# Patient Record
Sex: Female | Born: 1945 | Race: Black or African American | Hispanic: No | Smoking: Former smoker
Health system: Southern US, Community
[De-identification: ages and names within clinical notes are randomized; demographics above are authoritative.]

## PROBLEM LIST (undated history)

## (undated) DIAGNOSIS — M1990 Unspecified osteoarthritis, unspecified site: Secondary | ICD-10-CM

## (undated) DIAGNOSIS — R350 Frequency of micturition: Secondary | ICD-10-CM

## (undated) DIAGNOSIS — L732 Hidradenitis suppurativa: Secondary | ICD-10-CM

## (undated) DIAGNOSIS — IMO0001 Reserved for inherently not codable concepts without codable children: Secondary | ICD-10-CM

## (undated) DIAGNOSIS — Z9221 Personal history of antineoplastic chemotherapy: Secondary | ICD-10-CM

## (undated) DIAGNOSIS — E119 Type 2 diabetes mellitus without complications: Secondary | ICD-10-CM

## (undated) DIAGNOSIS — D649 Anemia, unspecified: Secondary | ICD-10-CM

## (undated) DIAGNOSIS — E785 Hyperlipidemia, unspecified: Secondary | ICD-10-CM

## (undated) DIAGNOSIS — I10 Essential (primary) hypertension: Secondary | ICD-10-CM

## (undated) DIAGNOSIS — I517 Cardiomegaly: Secondary | ICD-10-CM

## (undated) DIAGNOSIS — K219 Gastro-esophageal reflux disease without esophagitis: Secondary | ICD-10-CM

## (undated) DIAGNOSIS — Z853 Personal history of malignant neoplasm of breast: Secondary | ICD-10-CM

## (undated) DIAGNOSIS — Z923 Personal history of irradiation: Secondary | ICD-10-CM

## (undated) DIAGNOSIS — J449 Chronic obstructive pulmonary disease, unspecified: Secondary | ICD-10-CM

## (undated) HISTORY — PX: BLADDER SUSPENSION: SHX72

## (undated) HISTORY — DX: Unspecified osteoarthritis, unspecified site: M19.90

## (undated) HISTORY — PX: TONSILLECTOMY: SUR1361

## (undated) HISTORY — PX: ABDOMINAL HYSTERECTOMY: SHX81

## (undated) HISTORY — PX: PILONIDAL CYST EXCISION: SHX744

## (undated) HISTORY — DX: Chronic obstructive pulmonary disease, unspecified: J44.9

## (undated) HISTORY — DX: Morbid (severe) obesity due to excess calories: E66.01

## (undated) HISTORY — DX: Cardiomegaly: I51.7

## (undated) HISTORY — DX: Anemia, unspecified: D64.9

## (undated) HISTORY — DX: Essential (primary) hypertension: I10

## (undated) HISTORY — DX: Hyperlipidemia, unspecified: E78.5

---

## 1998-08-16 ENCOUNTER — Other Ambulatory Visit: Admission: RE | Admit: 1998-08-16 | Discharge: 1998-08-16 | Payer: Self-pay

## 1998-09-20 ENCOUNTER — Ambulatory Visit (HOSPITAL_BASED_OUTPATIENT_CLINIC_OR_DEPARTMENT_OTHER): Admission: RE | Admit: 1998-09-20 | Discharge: 1998-09-20 | Payer: Self-pay | Admitting: Surgery

## 2004-06-04 ENCOUNTER — Encounter (INDEPENDENT_AMBULATORY_CARE_PROVIDER_SITE_OTHER): Payer: Self-pay | Admitting: *Deleted

## 2004-06-04 ENCOUNTER — Ambulatory Visit (HOSPITAL_COMMUNITY): Admission: RE | Admit: 2004-06-04 | Discharge: 2004-06-04 | Payer: Self-pay | Admitting: Surgery

## 2004-06-04 ENCOUNTER — Ambulatory Visit (HOSPITAL_BASED_OUTPATIENT_CLINIC_OR_DEPARTMENT_OTHER): Admission: RE | Admit: 2004-06-04 | Discharge: 2004-06-04 | Payer: Self-pay | Admitting: Surgery

## 2004-06-04 HISTORY — PX: PILONIDAL CYST / SINUS EXCISION: SUR543

## 2004-06-05 ENCOUNTER — Ambulatory Visit (HOSPITAL_COMMUNITY): Admission: RE | Admit: 2004-06-05 | Discharge: 2004-06-05 | Payer: Self-pay | Admitting: Surgery

## 2004-11-12 ENCOUNTER — Other Ambulatory Visit: Admission: RE | Admit: 2004-11-12 | Discharge: 2004-11-12 | Payer: Self-pay | Admitting: Family Medicine

## 2008-08-16 ENCOUNTER — Inpatient Hospital Stay (HOSPITAL_COMMUNITY): Admission: EM | Admit: 2008-08-16 | Discharge: 2008-08-18 | Payer: Self-pay | Admitting: Emergency Medicine

## 2008-08-17 HISTORY — PX: INCISION AND DRAINAGE ABSCESS: SHX5864

## 2010-06-10 LAB — BASIC METABOLIC PANEL
CO2: 32 mEq/L (ref 19–32)
Calcium: 8.7 mg/dL (ref 8.4–10.5)
Chloride: 106 mEq/L (ref 96–112)
GFR calc non Af Amer: >60 mL/min (ref >60)
Glucose, Bld: 100 mg/dL — ABNORMAL HIGH (ref 70–99)
Glucose, Bld: 162 mg/dL — ABNORMAL HIGH (ref 70–99)
Potassium: 4.4 mEq/L (ref 3.5–5.1)
Sodium: 137 mEq/L (ref 135–145)
Sodium: 140 mEq/L (ref 135–145)

## 2010-06-10 LAB — GLUCOSE, CAPILLARY
Glucose-Capillary: 100 mg/dL — ABNORMAL HIGH (ref 70–99)
Glucose-Capillary: 107 mg/dL — ABNORMAL HIGH (ref 70–99)
Glucose-Capillary: 109 mg/dL — ABNORMAL HIGH (ref 70–99)
Glucose-Capillary: 118 mg/dL — ABNORMAL HIGH (ref 70–99)
Glucose-Capillary: 173 mg/dL — ABNORMAL HIGH (ref 70–99)
Glucose-Capillary: 253 mg/dL — ABNORMAL HIGH (ref 70–99)
Glucose-Capillary: 79 mg/dL (ref 70–99)
Glucose-Capillary: 79 mg/dL (ref 70–99)
Glucose-Capillary: 90 mg/dL (ref 70–99)
Glucose-Capillary: 96 mg/dL (ref 70–99)

## 2010-06-10 LAB — DIFFERENTIAL
Basophils Relative: 1 % (ref 0–1)
Eosinophils Absolute: 0.2 10*3/uL (ref 0.0–0.7)
Neutrophils Relative %: 62 % (ref 43–77)

## 2010-06-10 LAB — CBC
MCHC: 32.7 g/dL (ref 30.0–36.0)
MCV: 86.3 fL (ref 78.0–100.0)
Platelets: 226 10*3/uL (ref 150–400)

## 2010-07-16 NOTE — Op Note (Signed)
Kristy Harris, Kristy Harris               ACCOUNT NO.:  [id number]   MEDICAL RECORD NO.:  [id number]          PATIENT TYPE:  INP   LOCATION:  5149                         FACILITY:  [facility]   PHYSICIAN:  Sandria Bales. Ezzard Standing, M.D.  DATE OF BIRTH:  07-May-1945   DATE OF PROCEDURE:  08/17/2008  DATE OF DISCHARGE:                               OPERATIVE REPORT   Date of Surgery - 17 August 2008   PREOPERATIVE DIAGNOSIS:  Multiple perineal and buttock abscesses.   POSTOPERATIVE DIAGNOSIS:  Multiple perineal and buttock abscesses (one  anterior right groin, one posterior right groin, one posterior left  groin and then left buttock/perianal area).   PROCEDURE:  Incision and drainage of the abscesses.   SURGEON:  Sandria Bales. Ezzard Standing, M.D.   ANESTHESIA:  General endotracheal.   ESTIMATED BLOOD LOSS:  100 mL.   DRAINS LEFT:  None.   INDICATIONS FOR PROCEDURE:  [name] is a 65 year old black female  who is diabetic and morbidly obese with a BMI approximately 54 who has  had recurrent multiple perineal and buttocks abscesses.   She has come again with these abscesses, which are sore, tender.  She  was seen by Dr. Manus Rudd in our urgent office, who then sent her to  [facility], where I am the doctor of the week.   I discussed with her about these abscesses.  The critical thing to  control these recurrent infections are for her to loose weight.  I  cannot stress this enough to her that without weight loss, she will  continue to have recurrent abscesses.  I think the weight loss will help  control her diabetes, which will also help these abscesses.   Potential complications of the surgery include, but are not limited to,  bleeding, infection, recurrence of the abscess, which again is almost  certain if she cannot control her weight.   OPERATIVE NOTE:  The patient was placed in the lithotomy position after  general endotracheal anesthetic.  Her perineum was prepped with Betadine  solution.  I  then incised first a right posterior groin abscess, lateral  to the posterior wall of the vagina; left posterior groin abscess at the  level of the posterior wall of the vagina; and then a left kind of  buttocks perianal abscess, which is kind of complex, watering pot  appearance to it.   The abscesses were then packed with the Betadine gauze and dressed.   The patient tolerated the procedure well was transported to recovery  room in good condition.  She will be kept overnight because of her  history of sleep apneas and these wounds and then tomorrow we will  arrange for home health care to see her after she has gone home.      Sandria Bales. Ezzard Standing, M.D.  Electronically Signed     DHN/MEDQ  D:  08/17/2008  T:  08/18/2008  Job:  [id number]

## 2010-07-19 NOTE — Op Note (Signed)
Kristy Harris, Kristy Harris               ACCOUNT NO.:  [id number]   MEDICAL RECORD NO.:  [id number]          PATIENT TYPE:  AMB   LOCATION:  DSC                          FACILITY:  [facility]   PHYSICIAN:  Sandria Bales. Ezzard Standing, M.D.  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   POSTOPERATIVE DIAGNOSES:  Recurrent draining sinus in old pilonidal  incision.  Approximately 2 cm in size.   PROCEDURE:  Excision of draining sinus.   SURGEON:  Sandria Bales. Ezzard Standing, M.D.   ANESTHESIA:  Approximately 15 mL of 1% Xylocaine.   COMPLICATIONS:  None.   INDICATIONS FOR PROCEDURE:  Ms. Polimeni had a pilonidal cyst excised in July  2000.  Approximately three monthsago,  in January 2006, she started to  develop some drainage from the upper end of this pilonidal cyst excision  track.  The patient has approximately a 2.0 cm draining wound.  It is  unclear whether this is a recurrence of the pilonidal or just some kind of a  cyst drainage within the scar itself.  Ms. Harding continues to be morbidly  obese and she knows this increases the risk of recurrent wound problems.  She is not doing anything to control her weight.   I discussed with her the indications of the procedure, the potential risks  to include bleeding, infection and that the sinus track could go much deeper  than what could be viewed behind and under local anesthesia.   DESCRIPTION OF PROCEDURE:  The patient is taken to [facility] in the minor surgery room.  She is placed in a prone  position.  Her buttocks was prepped with Betadine solution and sterilely  draped.  I infiltrated the skin with about 15 mL of 1% Xylocaine.  I then  made an elliptical incision around this 2.0 cm chronic draining sinus.  The  sinus/cyst did appear to be superficial and did not  go deep.  I closed with  interrupted #3-0 nylon sutures.   I will leave the #3-0 nylon sutures in for two weeks.  I have given her some  Vicodin for pain.  She is to call for any other problems, otherwise I will  see her in two weeks for suture removal and a review of the pathology.      DHN/MEDQ  D:  06/04/2004  T:  06/04/2004  Job:  [id number]   cc:   Ernestina Penna, M.D.  [address] [place]  [place] [address]  Fax: [phone]

## 2011-01-17 ENCOUNTER — Encounter: Payer: Self-pay | Admitting: *Deleted

## 2011-01-24 ENCOUNTER — Encounter: Payer: Self-pay | Admitting: Cardiology

## 2011-01-24 ENCOUNTER — Encounter: Payer: Self-pay | Admitting: *Deleted

## 2011-01-24 ENCOUNTER — Ambulatory Visit (INDEPENDENT_AMBULATORY_CARE_PROVIDER_SITE_OTHER): Payer: Medicare Other | Admitting: Cardiology

## 2011-01-24 VITALS — BP 94/52 | HR 85 | Ht 64.0 in | Wt 311.4 lb

## 2011-01-24 DIAGNOSIS — R0600 Dyspnea, unspecified: Secondary | ICD-10-CM | POA: Insufficient documentation

## 2011-01-24 DIAGNOSIS — E669 Obesity, unspecified: Secondary | ICD-10-CM | POA: Insufficient documentation

## 2011-01-24 DIAGNOSIS — I10 Essential (primary) hypertension: Secondary | ICD-10-CM | POA: Insufficient documentation

## 2011-01-24 DIAGNOSIS — Z01810 Encounter for preprocedural cardiovascular examination: Secondary | ICD-10-CM | POA: Insufficient documentation

## 2011-01-24 DIAGNOSIS — R0609 Other forms of dyspnea: Secondary | ICD-10-CM

## 2011-01-24 DIAGNOSIS — E785 Hyperlipidemia, unspecified: Secondary | ICD-10-CM | POA: Insufficient documentation

## 2011-01-24 DIAGNOSIS — R011 Cardiac murmur, unspecified: Secondary | ICD-10-CM

## 2011-01-24 DIAGNOSIS — R0989 Other specified symptoms and signs involving the circulatory and respiratory systems: Secondary | ICD-10-CM

## 2011-01-24 NOTE — Assessment & Plan Note (Signed)
Scheduled echocardiogram. probable flow murmur.

## 2011-01-24 NOTE — Assessment & Plan Note (Signed)
Left carotid bruit noted. Schedule carotid dopplers.

## 2011-01-24 NOTE — Assessment & Plan Note (Signed)
I discussed the importance of weight loss. 

## 2011-01-24 NOTE — Assessment & Plan Note (Signed)
Continue statin. Lipids and liver monitored by primary care. 

## 2011-01-24 NOTE — Assessment & Plan Note (Signed)
There is most likely a component of obesity hypoventilation syndrome, obstructive sleep apnea and deconditioning. However given diabetes mellitus will arrange Myoview to exclude ischemic contribution.

## 2011-01-24 NOTE — Progress Notes (Signed)
HPI: a 65 year old female with past medical history of diabetes mellitus for preoperative evaluation prior to drainage and repair of abscesses of the perineum, groin and vulva area. Patient has limited mobility because of obesity and arthritis. She has significant dyspnea on exertion. She also describes orthopnea but this improves to CPAP. She has chronic pedal edema. She denies exertional chest pain.  Current Outpatient Prescriptions  Medication Sig Dispense Refill  . aspirin 81 MG tablet Take 81 mg by mouth daily.        Marland Kitchen atorvastatin (LIPITOR) 20 MG tablet Take 20 mg by mouth daily.       . cilostazol (PLETAL) 100 MG tablet Take 100 mg by mouth 2 (two) times daily.        . ergocalciferol (VITAMIN D2) 50000 UNITS capsule Take 50,000 Units by mouth once a week.       . furosemide (LASIX) 20 MG tablet Take 20 mg by mouth daily.        Marland Kitchen glipiZIDE (GLUCOTROL XL) 10 MG 24 hr tablet Take 10 mg by mouth daily.        Marland Kitchen lisinopril (PRINIVIL,ZESTRIL) 20 MG tablet Take 20 mg by mouth daily.        . metFORMIN (GLUCOPHAGE) 500 MG tablet Take 500 mg by mouth 2 (two) times daily with a meal.        . metoprolol (TOPROL-XL) 100 MG 24 hr tablet Take 100 mg by mouth daily.        Marland Kitchen sulfamethoxazole-trimethoprim (BACTRIM DS) 800-160 MG per tablet Take 1 tablet by mouth daily.       . valsartan (DIOVAN) 160 MG tablet Take 160 mg by mouth daily.        . verapamil (CALAN) 120 MG tablet Take 120 mg by mouth 3 (three) times daily.          Allergies  Allergen Reactions  . Penicillins Rash    Past Medical History  Diagnosis Date  . Diabetes mellitus   . Morbid obesity   . Recurrent boils     of perineal and buttocks  . Sleep apnea   . Hypertension   . Hyperlipidemia   . Hidradenitis   . COPD (chronic obstructive pulmonary disease)   . Enlarged heart     Past Surgical History  Procedure Date  . Pilonidal cyst excision   . Bladder suspension   . Abdominal hysterectomy   . Abcess drainage   .  Tonsillectomy     History   Social History  . Marital Status: Widowed    Spouse Name: N/A    Number of Children: 5  . Years of Education: N/A   Occupational History  .      Disabled   Social History Main Topics  . Smoking status: Former Smoker    Quit date: 03/03/1993  . Smokeless tobacco: Not on file  . Alcohol Use: No  . Drug Use: Not on file  . Sexually Active: Not on file   Other Topics Concern  . Not on file   Social History Narrative   Has 2 sons and 3 daughters    Family History  Problem Relation Age of Onset  . Heart attack Mother     had multiple health problems  . Lung cancer Father   . Lung cancer Brother     had multiple health problems  . Heart attack Brother     had multiple health problems  . Diabetes Brother     has  1 living brother with multiple health problems  . Diabetes Sister     has 4 living sisters with multiple health problems  . Other Sister     died from gun shot wound to the head    ROS: significant back pain, arthralgias and pain from chronic abscess but no fevers or chills, productive cough, hemoptysis, dysphasia, odynophagia, melena, hematochezia, dysuria, hematuria, rash, seizure activity,  claudication. Remaining systems are negative.  Physical Exam:  Blood pressure 94/52, pulse 85, height 5\' 4"  (1.626 m), weight 311 lb 6.4 oz (141.25 kg).  General:  Well developed/morbidly obese in NAD Skin warm/dry Patient not depressed No peripheral clubbing Back-normal HEENT-normal/normal eyelids Neck supple/normal carotid upstroke bilaterally; left carotid bruit; no JVD; no thyromegaly chest - CTA/ normal expansion CV - RRR/normal S1 and S2; no  rubs or gallops;  PMI nondisplaced; 2/6 systolic murmur left sternal border. S2 is not diminished. Abdomen -difficult due to obesity, NT/ND, no HSM, no mass, + bowel sounds, no bruit femoral pulses not palpated Ext-trace edema, no chords, 2+ DP Neuro-grossly nonfocal  ECG NSR with  nonspecific ST changes.

## 2011-01-24 NOTE — Patient Instructions (Signed)
   Lexiscan cardiolite stress test  Echo   Carotid Dopplers If the results of your test are normal or stable, you will receive a letter.  If they are abnormal, the nurse will contact you by phone. Follow up in  1 month

## 2011-01-24 NOTE — Assessment & Plan Note (Signed)
Blood pressure controlled. Continue present medications. 

## 2011-01-24 NOTE — Assessment & Plan Note (Signed)
Patient has multiple cardiac risk factors including diabetes, hypertension, hyperlipidemia, family history and remote tobacco abuse. She has limited mobility and has significant dyspnea on exertion. Will arrange Myoview for risk stratification. If negative or low risk then she may proceed with surgery.

## 2011-01-28 ENCOUNTER — Other Ambulatory Visit: Payer: Self-pay | Admitting: Cardiology

## 2011-01-28 DIAGNOSIS — Z01810 Encounter for preprocedural cardiovascular examination: Secondary | ICD-10-CM

## 2011-01-28 DIAGNOSIS — R0600 Dyspnea, unspecified: Secondary | ICD-10-CM

## 2011-01-28 DIAGNOSIS — R011 Cardiac murmur, unspecified: Secondary | ICD-10-CM

## 2011-01-28 DIAGNOSIS — R0989 Other specified symptoms and signs involving the circulatory and respiratory systems: Secondary | ICD-10-CM

## 2011-01-29 ENCOUNTER — Telehealth: Payer: Self-pay | Admitting: *Deleted

## 2011-01-29 NOTE — Telephone Encounter (Signed)
Lexiscan Cardiolite, Cartoid Dopplers, 2 D ECHO scheduled for 02-03-2011 @ [facility] Checking percert

## 2011-01-29 NOTE — Telephone Encounter (Signed)
No precert required 

## 2011-02-03 DIAGNOSIS — R079 Chest pain, unspecified: Secondary | ICD-10-CM

## 2011-02-04 DIAGNOSIS — R0602 Shortness of breath: Secondary | ICD-10-CM

## 2011-02-14 ENCOUNTER — Ambulatory Visit (INDEPENDENT_AMBULATORY_CARE_PROVIDER_SITE_OTHER): Payer: Medicare Other | Admitting: Cardiology

## 2011-02-14 ENCOUNTER — Encounter: Payer: Self-pay | Admitting: *Deleted

## 2011-02-14 ENCOUNTER — Encounter: Payer: Self-pay | Admitting: Physician Assistant

## 2011-02-14 VITALS — BP 142/78 | HR 93 | Ht 64.0 in | Wt 308.0 lb

## 2011-02-14 DIAGNOSIS — I739 Peripheral vascular disease, unspecified: Secondary | ICD-10-CM

## 2011-02-14 DIAGNOSIS — R9430 Abnormal result of cardiovascular function study, unspecified: Secondary | ICD-10-CM

## 2011-02-14 DIAGNOSIS — Z01810 Encounter for preprocedural cardiovascular examination: Secondary | ICD-10-CM

## 2011-02-14 DIAGNOSIS — R0989 Other specified symptoms and signs involving the circulatory and respiratory systems: Secondary | ICD-10-CM

## 2011-02-14 DIAGNOSIS — R0602 Shortness of breath: Secondary | ICD-10-CM

## 2011-02-14 DIAGNOSIS — E785 Hyperlipidemia, unspecified: Secondary | ICD-10-CM

## 2011-02-14 NOTE — Patient Instructions (Signed)
   JV Cath - arm case - next Friday  Follow up will be given at time of discharge from above

## 2011-02-15 DIAGNOSIS — R9430 Abnormal result of cardiovascular function study, unspecified: Secondary | ICD-10-CM | POA: Insufficient documentation

## 2011-02-15 DIAGNOSIS — E119 Type 2 diabetes mellitus without complications: Secondary | ICD-10-CM | POA: Insufficient documentation

## 2011-02-15 DIAGNOSIS — I739 Peripheral vascular disease, unspecified: Secondary | ICD-10-CM | POA: Insufficient documentation

## 2011-02-15 NOTE — Assessment & Plan Note (Signed)
The patient was instructed to hold her metformin the day of her procedure as well as 48 hours thereafter.

## 2011-02-15 NOTE — Assessment & Plan Note (Signed)
I had a long discussion with the patient on how to proceed with the information obtained from a cardiovascular function study. She has no unstable symptoms although clinical evaluation is extremely limited due to her poor functional status related to her severe arthritis and groin abscess which cause significant distress and pain. The patient does have multiple cardiovascular risk factors and now an abnormal cardiovascular function study. Although she potentially could be cleared for surgery without proceeding first with a cardiac catheterization, given the fact that her surgery is not scheduled until later in January 2013, I told the patient that regardless of the surgery she is a high risk patient and we should proceed with cardiac catheterization to define her coronary anatomy. However, I also told her that hopefully we can avoid stent placement or further revascularization prior to her surgery. The patient is at high infectious risk with an active abscess in her right groin and also placing her on dual antiplatelet therapy would prohibit her from proceeding with surgery. However if she has critical disease that is amenable to percutaneous intervention, consideration may be given to a bare-metal stent with antiplatelet therapy for 30 days, minimally delaying her surgery. However this is still not an ideal scenario given her active abscess in the groin, perineum and vulva area. I told the patient that I am actually recommending this more from risk stratification perspective. Potential intervention and revascularization is needed may still need to wait until after surgery. Depending on the findings of her cardiac catheterization will define her further immediate risk for surgery. I discussed at great length with the patient's the risks and benefits of cardiac catheterization. She understands these and is willing to proceed. Obviously, we will need to proceed from an arm approach given her active infection in the  groin area. The patient will be scheduled for next week.

## 2011-02-15 NOTE — Progress Notes (Signed)
 Guy De Gent, MD, FACC ABIM Board Certified in Adult Cardiovascular Medicine,Internal Medicine and Critical Care Medicine    CC: Preoperative evaluation after recent positive nuclear perfusion study.  HPI:  The patient is a 65-year-old female with multiple cardiovascular risk factors including diabetes mellitus and a very strong family history of coronary artery disease with her brother with an MI in his 40s and mother had heart disease at age 67. The patient is scheduled in 2013 to undergo extensive surgery of the groin area because of an abscess involving the perineum and groin and vulva area. The patient was recently seen for preoperative evaluation by Dr. Brian Crenshaw. Several studies were ordered including carotid Dopplers, echocardiogram and nuclear perfusion study. Dopplers were essentially negative the echocardiogram showed normal LV function, however the nuclear perfusion study was abnormal with 2 defects one small apical defect that was reversible as well as a medium-sized lateral defect that was reversible. 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 She does become short of breath on minimal exertion although she denies any chest pain. She does report orthopnea PND. She has sleep apnea and is on CPAP. She's here today to discuss the abnormal Cardiolite stress study and whether we should proceed with cardiac catheterization prior to her surgical procedure.     PMH: reviewed and listed in Problem List in Electronic Records (and see below) Past Medical History  Diagnosis Date  . Diabetes mellitus   . Morbid obesity   . Recurrent boils     of perineal and buttocks  . Sleep apnea   . Hypertension   . Hyperlipidemia   . Hidradenitis   . COPD (chronic obstructive pulmonary disease)   . Enlarged heart    Past Surgical History  Procedure Date  .  Pilonidal cyst excision   . Bladder suspension   . Abdominal hysterectomy   . Abcess drainage   . Tonsillectomy       Allergies/SH/FHX : available in Electronic Records for review Allergies  Allergen Reactions  . Penicillins Rash   History   Social History  . Marital Status: Widowed    Spouse Name: N/A    Number of Children: 5  . Years of Education: N/A   Occupational History  .      Disabled   Social History Main Topics  . Smoking status: Former Smoker    Quit date: 03/03/1993  . Smokeless tobacco: Never Used  . Alcohol Use: No  . Drug Use: Not on file  . Sexually Active: Not on file   Other Topics Concern  . Not on file   Social History Narrative   Has 2 sons and 3 daughters   Family History  Problem Relation Age of Onset  . Heart attack Mother     had multiple health problems  . Lung cancer Father   . Lung cancer Brother     had multiple health problems  . Heart attack Brother     had multiple health problems  . Diabetes Brother     has 1 living brother with multiple health problems  . Diabetes Sister     has 4 living sisters with multiple health problems  . Other Sister     died from gun shot wound to the head    Medications: Current Outpatient Prescriptions  Medication Sig Dispense Refill  . aspirin 81   MG tablet Take 81 mg by mouth daily.        . atorvastatin (LIPITOR) 20 MG tablet Take 20 mg by mouth daily.       . cilostazol (PLETAL) 100 MG tablet Take 100 mg by mouth 2 (two) times daily.        . ergocalciferol (VITAMIN D2) 50000 UNITS capsule Take 50,000 Units by mouth once a week.       . furosemide (LASIX) 20 MG tablet Take 20 mg by mouth daily.        . glipiZIDE (GLUCOTROL XL) 10 MG 24 hr tablet Take 10 mg by mouth daily.        . lisinopril (PRINIVIL,ZESTRIL) 20 MG tablet Take 20 mg by mouth daily.        . metFORMIN (GLUCOPHAGE) 500 MG tablet Take 500 mg by mouth 2 (two) times daily with a meal.        . metoprolol (TOPROL-XL) 100 MG  24 hr tablet Take 100 mg by mouth daily.        . sulfamethoxazole-trimethoprim (BACTRIM DS) 800-160 MG per tablet Take 1 tablet by mouth daily.       . valsartan (DIOVAN) 160 MG tablet Take 160 mg by mouth daily.        . verapamil (CALAN) 120 MG tablet Take 120 mg by mouth 3 (three) times daily.          ROS: No nausea or vomiting. No fever or chills.No melena or hematochezia.No bleeding.No claudication. Limited mobility. Chronic knee pain bilaterally   Physical Exam: BP 142/78  Pulse 93  Ht 5' 4" (1.626 m)  Wt 308 lb (139.708 kg)  BMI 52.87 kg/m2  SpO2 93% General:of obese African American female in no distress  Neck: normal carotid upstroke without carotid bruits. No thyromegaly nonnodular thyroid. JVD is approximately 7 cm sitting upright  Lungs: clear breath sounds bilaterally without wheezing.  Cardiac: regular rate and rhythm with normal S1-S2 and no murmur rubs or gallops  Vascular: 1+ peripheral pitting edema. Normal dorsalis pedis and posterior tibial pulses bilaterally. Not performed  Skin:  12lead ECG: Limited bedside ECHO:N/A   Assessment and Plan  Counseling was provided regarding the current medical condition and included: . Diagnosis, impressions, prognosis, recommended diagnostic studies  . Risks and benefits of treatment options  . Instructions for management, treatment and/or follow-up care  . Importance of compliance with treatment, risk factor reduction  . Patient and/or family education    Time spent counseling was 60 minutes and recorded in the Problem List.     

## 2011-02-15 NOTE — Assessment & Plan Note (Signed)
I could not hear a carotid bruit on exam. Carotid Dopplers were also within normal limits.

## 2011-02-15 NOTE — Assessment & Plan Note (Signed)
I do not have any further details on this although I noticed that the patient is on Pletal reviewing her medication list. She does not report any active claudication but again she is very limited in her activity. The patient could potentially undergo at the time of her catheterization and distal angiogram, although we will wait for that decision after review of her renal function. Certainly if further noninvasive evaluation can be done at a later time. The patient currently has no poor healing wounds in the extremities.

## 2011-02-15 NOTE — Assessment & Plan Note (Signed)
Followed by the patient's primary care physician and she has taken Lipitor.

## 2011-02-17 ENCOUNTER — Telehealth: Payer: Self-pay | Admitting: *Deleted

## 2011-02-17 NOTE — Telephone Encounter (Signed)
°  Left JV cath scheduled for Friday, 12/21 at 10:30 with [facility].

## 2011-02-18 NOTE — Telephone Encounter (Signed)
No precert required 

## 2011-02-20 ENCOUNTER — Other Ambulatory Visit: Payer: Self-pay | Admitting: Cardiology

## 2011-02-20 DIAGNOSIS — I2510 Atherosclerotic heart disease of native coronary artery without angina pectoris: Secondary | ICD-10-CM

## 2011-02-21 ENCOUNTER — Encounter (HOSPITAL_BASED_OUTPATIENT_CLINIC_OR_DEPARTMENT_OTHER)
Admission: RE | Disposition: A | Discharge status: Home / Self Care | Payer: Self-pay | Source: Ambulatory Visit | Attending: Cardiovascular Disease

## 2011-02-21 ENCOUNTER — Inpatient Hospital Stay (HOSPITAL_BASED_OUTPATIENT_CLINIC_OR_DEPARTMENT_OTHER)
Admission: RE | Admit: 2011-02-21 | Discharge: 2011-02-21 | Disposition: A | Discharge status: Home / Self Care | Payer: Medicare Other | Source: Ambulatory Visit | Attending: Cardiovascular Disease | Admitting: Cardiovascular Disease

## 2011-02-21 ENCOUNTER — Encounter (HOSPITAL_BASED_OUTPATIENT_CLINIC_OR_DEPARTMENT_OTHER): Payer: Self-pay | Admitting: Cardiovascular Disease

## 2011-02-21 DIAGNOSIS — E119 Type 2 diabetes mellitus without complications: Secondary | ICD-10-CM | POA: Insufficient documentation

## 2011-02-21 DIAGNOSIS — R0609 Other forms of dyspnea: Secondary | ICD-10-CM

## 2011-02-21 DIAGNOSIS — I2510 Atherosclerotic heart disease of native coronary artery without angina pectoris: Secondary | ICD-10-CM

## 2011-02-21 DIAGNOSIS — R0989 Other specified symptoms and signs involving the circulatory and respiratory systems: Secondary | ICD-10-CM

## 2011-02-21 DIAGNOSIS — G4730 Sleep apnea, unspecified: Secondary | ICD-10-CM | POA: Insufficient documentation

## 2011-02-21 DIAGNOSIS — R9439 Abnormal result of other cardiovascular function study: Secondary | ICD-10-CM | POA: Insufficient documentation

## 2011-02-21 DIAGNOSIS — J449 Chronic obstructive pulmonary disease, unspecified: Secondary | ICD-10-CM | POA: Insufficient documentation

## 2011-02-21 DIAGNOSIS — J4489 Other specified chronic obstructive pulmonary disease: Secondary | ICD-10-CM | POA: Insufficient documentation

## 2011-02-21 DIAGNOSIS — I517 Cardiomegaly: Secondary | ICD-10-CM | POA: Insufficient documentation

## 2011-02-21 HISTORY — PX: CARDIAC CATHETERIZATION: SHX172

## 2011-02-21 LAB — POCT I-STAT GLUCOSE
Glucose, Bld: 76 mg/dL (ref 70–99)
Operator id: 221371

## 2011-02-21 SURGERY — JV LEFT HEART CATHETERIZATION WITH CORONARY ANGIOGRAM
Anesthesia: Moderate Sedation

## 2011-02-21 MED ORDER — ASPIRIN 81 MG PO CHEW
324.0000 mg | CHEWABLE_TABLET | ORAL | Status: AC
  Administered 2011-02-21: 324 mg via ORAL

## 2011-02-21 MED ORDER — DIAZEPAM 5 MG PO TABS
5.0000 mg | ORAL_TABLET | ORAL | Status: AC
  Administered 2011-02-21: 5 mg via ORAL

## 2011-02-21 MED ORDER — ONDANSETRON HCL 4 MG/2ML IJ SOLN: 4.0000 mg | Freq: Four times a day (QID) | INTRAMUSCULAR | Status: DC | PRN

## 2011-02-21 MED ORDER — ACETAMINOPHEN 325 MG PO TABS: 650.0000 mg | ORAL_TABLET | ORAL | Status: DC | PRN

## 2011-02-21 MED ORDER — SODIUM CHLORIDE 0.9 % IV SOLN
INTRAVENOUS | Status: DC
  Administered 2011-02-21: 11:00:00 via INTRAVENOUS

## 2011-02-21 NOTE — Progress Notes (Signed)
Allen's test  Performed  On right wrist with normal results.

## 2011-02-21 NOTE — Brief Op Note (Signed)
    Cardiac Cath Note  Kristy Harris [id number] Dec 24, 1945  Procedure: Left Heart Cardiac Catheterization Note Indications: Dyspnea, Abn. Myoview  Procedure Details Consent: Obtained Time Out: Verified patient identification, verified procedure, site/side was marked, verified correct patient position, special equipment/implants available, Radiology Safety Procedures followed,  medications/allergies/relevent history reviewed, required imaging and test results available.  Performed  The right radial artery was cannulated using the standard technique.  Verapamil 3 mg intraarterial was given.  Heparin 5000 units IV was given.  The catheters were all exchanged over a Rosen guidewire.  Hemodynamics:   LV pressure: 134/12 Aortic pressure: 135/48  Angiography   Left Main: Smooth and normal.  Left anterior Descending: The left anterior descending artery is a large vessel. It is smooth and normal throughout course. It is fairly tortuous in the mid and distal segments. It gives off a large first diagonal branch which is normal.  Left Circumflex: The left circumflex artery is a moderate-sized branch. It is smooth and normal throughout its course.  Ramus intermediate artery: The ramus intermediate artery is moderate to large in size and is completely normal.  Right Coronary Artery: The right coronary artery is large and dominant. The posterior descending artery is normal. The posterior lateral segment artery is normal.  LV Gram: The left ventriculogram was performed in the 30 RAO position. It reveals vigorous left ventricular systolic function.  Ejection fraction is about a 75%.  Complications: No apparent complications Patient did tolerate procedure well.  Conclusions:   1. Smooth and normal coronary arteries  2. Normal left ventricular systolic function.   [name], Montez Hageman., MD, [facility] [place] 02/21/2011, 12:27 PM

## 2011-02-21 NOTE — H&P (View-Only) (Signed)
Kristy Bottoms, MD, [facility] Board Certified in Adult Cardiovascular Medicine,Internal Medicine and Critical Care Medicine    CC: Preoperative evaluation after recent positive nuclear perfusion study.  HPI:  The patient is a 65 year old female with multiple cardiovascular risk factors including diabetes mellitus and a very strong family history of coronary artery disease with her brother with an MI in his 41s and mother had heart disease at age 33. The patient is scheduled in 2013 to undergo extensive surgery of the groin area because of an abscess involving the perineum and groin and vulva area. The patient was recently seen for preoperative evaluation by Dr. Olga Millers. Several studies were ordered including carotid Dopplers, echocardiogram and nuclear perfusion study. Dopplers were essentially negative the echocardiogram showed normal LV function, however the nuclear perfusion study was abnormal with 2 defects one small apical defect that was reversible as well as a medium-sized lateral defect that was reversible. 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 She does become short of breath on minimal exertion although she denies any chest pain. She does report orthopnea PND. She has sleep apnea and is on CPAP. She's here today to discuss the abnormal Cardiolite stress study and whether we should proceed with cardiac catheterization prior to her surgical procedure.     PMH: reviewed and listed in Problem List in Electronic Records (and see below) Past Medical History  Diagnosis Date  . Diabetes mellitus   . Morbid obesity   . Recurrent boils     of perineal and buttocks  . Sleep apnea   . Hypertension   . Hyperlipidemia   . Hidradenitis   . COPD (chronic obstructive pulmonary disease)   . Enlarged heart    Past Surgical History  Procedure Date  .  Pilonidal cyst excision   . Bladder suspension   . Abdominal hysterectomy   . Abcess drainage   . Tonsillectomy       Allergies/SH/FHX : available in Electronic Records for review Allergies  Allergen Reactions  . Penicillins Rash   History   Social History  . Marital Status: Widowed    Spouse Name: N/A    Number of Children: 5  . Years of Education: N/A   Occupational History  .      Disabled   Social History Main Topics  . Smoking status: Former Smoker    Quit date: 03/03/1993  . Smokeless tobacco: Never Used  . Alcohol Use: No  . Drug Use: Not on file  . Sexually Active: Not on file   Other Topics Concern  . Not on file   Social History Narrative   Has 2 sons and 3 daughters   Family History  Problem Relation Age of Onset  . Heart attack Mother     had multiple health problems  . Lung cancer Father   . Lung cancer Brother     had multiple health problems  . Heart attack Brother     had multiple health problems  . Diabetes Brother     has 1 living brother with multiple health problems  . Diabetes Sister     has 4 living sisters with multiple health problems  . Other Sister     died from gun shot wound to the head    Medications: Current Outpatient Prescriptions  Medication Sig Dispense Refill  . aspirin 81  MG tablet Take 81 mg by mouth daily.        Marland Kitchen atorvastatin (LIPITOR) 20 MG tablet Take 20 mg by mouth daily.       . cilostazol (PLETAL) 100 MG tablet Take 100 mg by mouth 2 (two) times daily.        . ergocalciferol (VITAMIN D2) 50000 UNITS capsule Take 50,000 Units by mouth once a week.       . furosemide (LASIX) 20 MG tablet Take 20 mg by mouth daily.        Marland Kitchen glipiZIDE (GLUCOTROL XL) 10 MG 24 hr tablet Take 10 mg by mouth daily.        Marland Kitchen lisinopril (PRINIVIL,ZESTRIL) 20 MG tablet Take 20 mg by mouth daily.        . metFORMIN (GLUCOPHAGE) 500 MG tablet Take 500 mg by mouth 2 (two) times daily with a meal.        . metoprolol (TOPROL-XL) 100 MG  24 hr tablet Take 100 mg by mouth daily.        Marland Kitchen sulfamethoxazole-trimethoprim (BACTRIM DS) 800-160 MG per tablet Take 1 tablet by mouth daily.       . valsartan (DIOVAN) 160 MG tablet Take 160 mg by mouth daily.        . verapamil (CALAN) 120 MG tablet Take 120 mg by mouth 3 (three) times daily.          ROS: No nausea or vomiting. No fever or chills.No melena or hematochezia.No bleeding.No claudication. Limited mobility. Chronic knee pain bilaterally   Physical Exam: BP 142/78  Pulse 93  Ht 5\' 4"  (1.626 m)  Wt 308 lb (139.708 kg)  BMI 52.87 kg/m2  SpO2 93% General:of obese African American female in no distress  Neck: normal carotid upstroke without carotid bruits. No thyromegaly nonnodular thyroid. JVD is approximately 7 cm sitting upright  Lungs: clear breath sounds bilaterally without wheezing.  Cardiac: regular rate and rhythm with normal S1-S2 and no murmur rubs or gallops  Vascular: 1+ peripheral pitting edema. Normal dorsalis pedis and posterior tibial pulses bilaterally. Not performed  Skin:  12lead ECG: Limited bedside ECHO:N/A   Assessment and Plan  Counseling was provided regarding the current medical condition and included: . Diagnosis, impressions, prognosis, recommended diagnostic studies  . Risks and benefits of treatment options  . Instructions for management, treatment and/or follow-up care  . Importance of compliance with treatment, risk factor reduction  . Patient and/or family education    Time spent counseling was 60 minutes and recorded in the Problem List.

## 2011-02-21 NOTE — Interval H&P Note (Signed)
History and Physical Interval Note:  02/21/2011 11:18 AM  Kristy Harris  has presented today for surgery, with the diagnosis of chest pain  The various methods of treatment have been discussed with the patient and family. After consideration of risks, benefits and other options for treatment, the patient has consented to  Procedure(s): JV LEFT HEART CATHETERIZATION WITH CORONARY ANGIOGRAM as a surgical intervention .  The patients' history has been reviewed, patient examined, no change in status, stable for surgery.  I have reviewed the patients' chart and labs.  Questions were answered to the patient's satisfaction.   I have talked to patient and family. Chart , results of tests, meds reviewed.  Plan is to do a radial diagnostic cath.  Dr. Sanjuana Kava will assist.    Elyn Aquas.

## 2011-02-21 NOTE — Progress Notes (Signed)
TR band removed and tegaderm dressing applied.  Ambulated to bathroom in a wheelchair.  R radial site level 0.

## 2011-02-21 NOTE — Op Note (Signed)
    Cardiac Cath Note  Kristy Harris [id number] 04/16/1945  Procedure: Left Heart Cardiac Catheterization Note Indications: Dyspnea, Abn. Myoview  Procedure Details Consent: Obtained Time Out: Verified patient identification, verified procedure, site/side was marked, verified correct patient position, special equipment/implants available, Radiology Safety Procedures followed,  medications/allergies/relevent history reviewed, required imaging and test results available.  Performed  The right radial artery was cannulated using the standard technique.  Verapamil 3 mg intraarterial was given.  Heparin 5000 units IV was given.  The catheters were all exchanged over a Rosen guidewire.  Hemodynamics:   LV pressure: 134/12 Aortic pressure: 135/48  Angiography   Left Main: Smooth and normal.  Left anterior Descending: The left anterior descending artery is a large vessel. It is smooth and normal throughout course. It is fairly tortuous in the mid and distal segments. It gives off a large first diagonal branch which is normal.  Left Circumflex: The left circumflex artery is a moderate-sized branch. It is smooth and normal throughout its course.  Ramus intermediate artery: The ramus intermediate artery is moderate to large in size and is completely normal.  Right Coronary Artery: The right coronary artery is large and dominant. The posterior descending artery is normal. The posterior lateral segment artery is normal.  LV Gram: The left ventriculogram was performed in the 30 RAO position. It reveals vigorous left ventricular systolic function.  Ejection fraction is about a 75%.  Complications: No apparent complications Patient did tolerate procedure well.  Conclusions:   1. Smooth and normal coronary arteries  2. Normal left ventricular systolic function.   Dekari Bures [name], [name], MD, FACC 02/21/2011, 12:27 PM      

## 2011-02-21 NOTE — Progress Notes (Signed)
Discharge instructions completed Wrist immobilizer applied.  Discharged to home via wheelchair with family.

## 2011-02-21 NOTE — Progress Notes (Signed)
3cc o f pressure removed from TR Band .  Tolerated well

## 2011-02-21 NOTE — Progress Notes (Signed)
TR band applied with 16 cc of air @ 1221.

## 2011-02-27 ENCOUNTER — Ambulatory Visit (INDEPENDENT_AMBULATORY_CARE_PROVIDER_SITE_OTHER): Payer: Medicare Other | Admitting: Cardiovascular Disease

## 2011-02-27 ENCOUNTER — Encounter: Payer: Self-pay | Admitting: Cardiovascular Disease

## 2011-02-27 DIAGNOSIS — I776 Arteritis, unspecified: Secondary | ICD-10-CM

## 2011-02-27 MED ORDER — PREDNISONE 20 MG PO TABS: ORAL_TABLET | ORAL | Status: DC

## 2011-02-27 MED ORDER — CEPHALEXIN 500 MG PO CAPS: 500.0000 mg | ORAL_CAPSULE | Freq: Three times a day (TID) | ORAL | Status: AC

## 2011-02-27 NOTE — Progress Notes (Signed)
HPI  This is a 65 year old female who basically walked into the clinic and requested to be seen due to problems that the cath access site. The patient had cardiac catheterization the last week on Friday through the right radial approach. There was no immediate complications. Cardiac catheterization overall showed no significant coronary artery disease. The patient started having gradual discomfort at the cath site with gradual swelling and warmth. There is a mild ecchymosis. Today, she woke up and her arm was significantly swollen. Her hand was in her side and was actually much lower than the rest of her body. The tenderness has worsened significantly over the last few days. She denies any fevers or chills. She still able to move her arm normally although that's limited because of the swelling. There is no cyanosis or cold feeling in her fingers.  Allergies  Allergen Reactions  . Penicillins Rash     Current Outpatient Prescriptions on File Prior to Visit  Medication Sig Dispense Refill  . aspirin 81 MG tablet Take 81 mg by mouth daily.        Marland Kitchen atorvastatin (LIPITOR) 20 MG tablet Take 20 mg by mouth daily.       . cilostazol (PLETAL) 100 MG tablet Take 100 mg by mouth 2 (two) times daily.        . ergocalciferol (VITAMIN D2) 50000 UNITS capsule Take 50,000 Units by mouth once a week.       Marland Kitchen glipiZIDE (GLUCOTROL XL) 10 MG 24 hr tablet Take 10 mg by mouth daily.        Marland Kitchen lisinopril (PRINIVIL,ZESTRIL) 20 MG tablet Take 20 mg by mouth daily.        . metoprolol (TOPROL-XL) 100 MG 24 hr tablet Take 100 mg by mouth daily.        Marland Kitchen sulfamethoxazole-trimethoprim (BACTRIM DS) 800-160 MG per tablet Take 1 tablet by mouth daily.       . valsartan (DIOVAN) 160 MG tablet Take 160 mg by mouth daily.        . verapamil (CALAN) 120 MG tablet Take 120 mg by mouth 3 (three) times daily.           Past Medical History  Diagnosis Date  . Diabetes mellitus   . Morbid obesity   . Recurrent boils     of  perineal and buttocks  . Sleep apnea   . Hypertension   . Hyperlipidemia   . Hidradenitis   . COPD (chronic obstructive pulmonary disease)   . Enlarged heart      Past Surgical History  Procedure Date  . Pilonidal cyst excision   . Bladder suspension   . Abdominal hysterectomy   . Abcess drainage   . Tonsillectomy   . Cardiac catheterization 02/21/2011    Normal coronary arteries, normal EF     Family History  Problem Relation Age of Onset  . Heart attack Mother     had multiple health problems  . Lung cancer Father   . Lung cancer Brother     had multiple health problems  . Heart attack Brother     had multiple health problems  . Diabetes Brother     has 1 living brother with multiple health problems  . Diabetes Sister     has 4 living sisters with multiple health problems  . Other Sister     died from gun shot wound to the head     History   Social History  . Marital Status:  Widowed    Spouse Name: N/A    Number of Children: 5  . Years of Education: N/A   Occupational History  .      Disabled   Social History Main Topics  . Smoking status: Former Smoker    Quit date: 03/03/1993  . Smokeless tobacco: Never Used  . Alcohol Use: No  . Drug Use: Not on file  . Sexually Active: Not on file   Other Topics Concern  . Not on file   Social History Narrative   Has 2 sons and 3 daughters     PHYSICAL EXAM   BP 149/78  Pulse 97  Temp 98.9 F (37.2 C)  Ht 5\' 4"  (1.626 m)  Wt 308 lb 12.8 oz (140.071 kg)  BMI 53.01 kg/m2  Constitutional: She is oriented to person, place, and time. She appears well-developed and well-nourished. No distress.  HENT: No nasal discharge.  Head: Normocephalic and atraumatic.  Eyes: Pupils are equal, round, and reactive to light. Right eye exhibits no discharge. Left eye exhibits no discharge.  Neck: Normal range of motion. Neck supple. No JVD present. No thyromegaly present.  Cardiovascular: Normal rate, regular  rhythm, normal heart sounds. Exam reveals no gallop and no friction rub.  No murmur heard.  Pulmonary/Chest: Effort normal and breath sounds normal. No stridor. No respiratory distress. She has no wheezes. She has no rales. She exhibits no tenderness.  Abdominal: Soft. Bowel sounds are normal. She exhibits no distension. There is no tenderness. There is no rebound and no guarding.  Musculoskeletal: Normal range of motion. She exhibits no edema and no tenderness.  Neurological: She is alert and oriented to person, place, and time. Coordination normal.  Skin: Skin is warm and dry. No rash noted. She is not diaphoretic. No erythema. No pallor.  Psychiatric: She has a normal mood and affect. Her behavior is normal. Judgment and thought content normal.  Right hand and wrist: There is significant swelling with diffuse ecchymosis but no clear evidence of hematoma. The radial pulse is faint. The fingers are warm. There is no cyanosis.    ASSESSMENT AND PLAN

## 2011-02-27 NOTE — Progress Notes (Signed)
Cath done on Friday, 12/21.  Pain started on Sunday, 12/23 & then swelling on Monday, 12/24.  Progressively getting worse.  Limited wrist movement & very painful.

## 2011-02-27 NOTE — Patient Instructions (Signed)
   Prednisone 40mg  (2 of the 20mg  tabs) daily x 5 days only  Keflex 500mg  three times per day x 5 days   Keep appointment on 1/11 as previously scheduled

## 2011-02-27 NOTE — Assessment & Plan Note (Signed)
Of the right radial artery post recent cardiac catheterization. There is no evidence of compromised circulation in her hand. The treatment of choice for this his nonsteroidal anti-inflammatory medications. However, the patient is not able to tolerate these medications due to previous GI problem. She did not even tolerate Celebrex in the past. Thus, I will instead give her prednisone 40 mg once daily for 5 days. I warned her about the effect on her glycemic control. I will also give her Keflex 500 mg Q8 hours for 5 days. I advised her to keep her right hand elevated. She will need close followup appointment to recheck on the side. She has an appointment scheduled on January 11 and asked her to keep that. I explained to her all signs of compromised circulation that would require immediate attention.

## 2011-03-14 ENCOUNTER — Ambulatory Visit (INDEPENDENT_AMBULATORY_CARE_PROVIDER_SITE_OTHER): Payer: Medicare Other | Admitting: Physician Assistant

## 2011-03-14 ENCOUNTER — Encounter: Payer: Self-pay | Admitting: Physician Assistant

## 2011-03-14 DIAGNOSIS — R609 Edema, unspecified: Secondary | ICD-10-CM

## 2011-03-14 DIAGNOSIS — F410 Panic disorder [episodic paroxysmal anxiety] without agoraphobia: Secondary | ICD-10-CM

## 2011-03-14 DIAGNOSIS — I776 Arteritis, unspecified: Secondary | ICD-10-CM

## 2011-03-14 MED ORDER — METHYLPREDNISOLONE (PAK) 4 MG PO TABS: ORAL_TABLET | ORAL | Status: AC

## 2011-03-14 NOTE — Assessment & Plan Note (Signed)
Patient continues to suffer from persistent swelling and discomfort of the right wrist, following cardiac catheterization on December 21, through the right radial artery. She was treated with prednisone and a short course of Keflex, when seen here in the clinic on December 27, by Dr. Kirke Corin. However, she states that the symptoms have essentially remained unchanged. Following review with Dr. Kirke Corin, plan is as follows: Proceed with ultrasound of the right wrist to rule out occlusion or thrombosis. Treatment with a nonsteroidal was considered; however, patient has previously reported intolerance to this. We also considered directing her to physical/occupational therapy, to assist in improving the mobility of her wrist. Additional recommendations to follow, pending review of the ultrasound results.

## 2011-03-14 NOTE — Progress Notes (Signed)
HPI: Patient returns to office for reassessment of persistent swelling and discomfort of the right wrist, status post elective cardiac catheterization, 02/21/2011.  Patient was seen here on 02/2711, by Dr. [place] [employer], for the aforementioned symptoms. He noted no evidence of compromised circulation to the hand. He placed her on a short course of Keflex and prednisone, citing prior intolerance to nonsteroidals. He also advised her to keep her right hand elevated. She reports today, however, that her symptoms and her discomfort have essentially remained unchanged, and continues to report significant pain.  Allergies  Allergen Reactions  . Penicillins Rash    Current Outpatient Prescriptions  Medication Sig Dispense Refill  . aspirin 81 MG tablet Take 81 mg by mouth daily.        Marland Kitchen atorvastatin (LIPITOR) 20 MG tablet Take 20 mg by mouth daily.       . cilostazol (PLETAL) 100 MG tablet Take 100 mg by mouth 2 (two) times daily.        . ergocalciferol (VITAMIN D2) 50000 UNITS capsule Take 50,000 Units by mouth once a week.       Marland Kitchen glipiZIDE (GLUCOTROL XL) 10 MG 24 hr tablet Take 10 mg by mouth daily.       Marland Kitchen lisinopril (PRINIVIL,ZESTRIL) 20 MG tablet Take 20 mg by mouth daily.        . metoprolol (TOPROL-XL) 100 MG 24 hr tablet Take 100 mg by mouth daily.        . predniSONE (DELTASONE) 20 MG tablet Take 2 tabs (40mg ) daily x 5 days only  10 tablet  0  . sulfamethoxazole-trimethoprim (BACTRIM DS) 800-160 MG per tablet Take 1 tablet by mouth daily.       . valsartan (DIOVAN) 160 MG tablet Take 160 mg by mouth daily.        . verapamil (CALAN) 120 MG tablet Take 120 mg by mouth 3 (three) times daily.        . methylPREDNIsolone (MEDROL DOSPACK) 4 MG tablet follow package directions  21 tablet  0    Past Medical History  Diagnosis Date  . Diabetes mellitus   . Morbid obesity   . Recurrent boils     of perineal and buttocks  . Sleep apnea   . Hypertension   . Hyperlipidemia   . Hidradenitis   .  COPD (chronic obstructive pulmonary disease)   . Enlarged heart     History   Social History  . Marital Status: Widowed    Spouse Name: N/A    Number of Children: 5  . Years of Education: N/A   Occupational History  .      Disabled   Social History Main Topics  . Smoking status: Former Smoker    Quit date: 03/03/1993  . Smokeless tobacco: Never Used  . Alcohol Use: No  . Drug Use: Not on file  . Sexually Active: Not on file   Other Topics Concern  . Not on file   Social History Narrative   Has 2 sons and 3 daughters    Family History  Problem Relation Age of Onset  . Heart attack Mother     had multiple health problems  . Lung cancer Father   . Lung cancer Brother     had multiple health problems  . Heart attack Brother     had multiple health problems  . Diabetes Brother     has 1 living brother with multiple health problems  . Diabetes Sister  has 4 living sisters with multiple health problems  . Other Sister     died from gun shot wound to the head    ROS: no nausea, vomiting; no fever, chills; no melena, hematochezia; no claudication  PHYSICAL EXAM:  BP 140/81  Pulse 102  Ht 5\' 4"  (1.626 m)  Wt 305 lb (138.347 kg)  BMI 52.35 kg/m2 GENERAL: 66 year old female, morbidly obese, sitting upright; NAD HEENT: NCAT, PERRLA, EOMI; sclera clear; no xanthelasma NECK: palpable bilateral carotid pulses, no bruits; no JVD; no TM LUNGS: CTA bilaterally CARDIAC: RRR (S1, S2); no significant murmurs; no rubs or gallops ABDOMEN: soft, non-tender; intact BS EXTREMETIES: Right wrist swollen, with small area of ecchymosis, and very tender to palpation; palpable right RP and antecubital pulse. SKIN: warm/dry; no obvious rash/lesions MUSCULOSKELETAL: no joint deformity NEURO: no focal deficit; NL affect   EKG:   ASSESSMENT & PLAN:

## 2011-03-14 NOTE — Patient Instructions (Signed)
   Right upper extremity ultrasound (arterial & venous) - today  Medrol dose pack   Physical Therapy Follow up in  1 week - see above

## 2011-03-17 ENCOUNTER — Other Ambulatory Visit: Payer: Self-pay | Admitting: Physician Assistant

## 2011-03-17 DIAGNOSIS — R609 Edema, unspecified: Secondary | ICD-10-CM

## 2011-03-17 DIAGNOSIS — F410 Panic disorder [episodic paroxysmal anxiety] without agoraphobia: Secondary | ICD-10-CM

## 2011-03-20 ENCOUNTER — Encounter: Payer: Self-pay | Admitting: Cardiovascular Disease

## 2011-03-20 ENCOUNTER — Ambulatory Visit (INDEPENDENT_AMBULATORY_CARE_PROVIDER_SITE_OTHER): Payer: Medicare Other | Admitting: Cardiovascular Disease

## 2011-03-20 DIAGNOSIS — I776 Arteritis, unspecified: Secondary | ICD-10-CM

## 2011-03-20 NOTE — Assessment & Plan Note (Signed)
The patient's overall arm condition seems to be better. We did do an arterial Doppler which basically showed intact circulation. She still has mild swelling. I suspect that she probably has some form of compartment syndrome due to subcutaneous tissue swelling. She still has significant limitations in using her right hand. Thus, she was referred to a hand surgeon for a second opinion. I suspect that she will need occupational therapy to try to improve the overall functioning.

## 2011-03-20 NOTE — Patient Instructions (Signed)
Your physician wants you to follow-up in: 3 months. CALL OUR OFFICE IN 1 MONTH TO SCHEDULE YOUR FOLLOW UP APPOINTMENT. Follow up with Dr. Amanda Pea as previously discussed.  Your physician recommends that you continue on your current medications as directed. Please refer to the Current Medication list given to you today.

## 2011-03-20 NOTE — Progress Notes (Signed)
HPI  This is a 66 year old female who is here today for followup visit. The patient had complications at the right radial artery catheterization site. Her cardiac catheterization showed mild nonobstructive coronary artery disease. When I first saw her the patient had significant subcutaneous tissue swelling. The site was warm to touch but the vascular circulation appeared intact. I suspected that she had arteritis and soft tissue swelling leading to extravascular compression. She had significant swelling in her hand and inability to contract her fingers. I treated her with Keflex and a short-arm steroid course. The swelling improved significantly gradually. The discomfort was still persistent up until a week ago. She was given another course of steroids last week. The discomfort has improved significantly she is now able to have movements in her fingers but has not gained full functioning. The discomfort has almost resolved.  Allergies  Allergen Reactions  . Penicillins Rash     Current Outpatient Prescriptions on File Prior to Visit  Medication Sig Dispense Refill  . aspirin 81 MG tablet Take 81 mg by mouth daily.        Marland Kitchen atorvastatin (LIPITOR) 20 MG tablet Take 20 mg by mouth daily.       . cilostazol (PLETAL) 100 MG tablet Take 100 mg by mouth 2 (two) times daily.        . ergocalciferol (VITAMIN D2) 50000 UNITS capsule Take 50,000 Units by mouth once a week.       Marland Kitchen glipiZIDE (GLUCOTROL XL) 10 MG 24 hr tablet Take 10 mg by mouth daily.       Marland Kitchen lisinopril (PRINIVIL,ZESTRIL) 20 MG tablet Take 20 mg by mouth daily.        . methylPREDNIsolone (MEDROL DOSPACK) 4 MG tablet follow package directions  21 tablet  0  . metoprolol (TOPROL-XL) 100 MG 24 hr tablet Take 100 mg by mouth daily.        Marland Kitchen sulfamethoxazole-trimethoprim (BACTRIM DS) 800-160 MG per tablet Take 1 tablet by mouth daily.       . valsartan (DIOVAN) 160 MG tablet Take 160 mg by mouth daily.        . verapamil (CALAN) 120 MG tablet  Take 120 mg by mouth 3 (three) times daily.        . predniSONE (DELTASONE) 20 MG tablet Take 2 tabs (40mg ) daily x 5 days only  10 tablet  0     Past Medical History  Diagnosis Date  . Diabetes mellitus   . Morbid obesity   . Recurrent boils     of perineal and buttocks  . Sleep apnea   . Hypertension   . Hyperlipidemia   . Hidradenitis   . COPD (chronic obstructive pulmonary disease)   . Enlarged heart      Past Surgical History  Procedure Date  . Pilonidal cyst excision   . Bladder suspension   . Abdominal hysterectomy   . Abcess drainage   . Tonsillectomy   . Cardiac catheterization 02/21/2011    Normal coronary arteries, normal EF     Family History  Problem Relation Age of Onset  . Heart attack Mother     had multiple health problems  . Lung cancer Father   . Lung cancer Brother     had multiple health problems  . Heart attack Brother     had multiple health problems  . Diabetes Brother     has 1 living brother with multiple health problems  . Diabetes Sister  has 4 living sisters with multiple health problems  . Other Sister     died from gun shot wound to the head     History   Social History  . Marital Status: Widowed    Spouse Name: N/A    Number of Children: 5  . Years of Education: N/A   Occupational History  .      Disabled   Social History Main Topics  . Smoking status: Former Smoker    Quit date: 03/03/1993  . Smokeless tobacco: Never Used  . Alcohol Use: No  . Drug Use: Not on file  . Sexually Active: Not on file   Other Topics Concern  . Not on file   Social History Narrative   Has 2 sons and 3 daughters     PHYSICAL EXAM   BP 142/83  Pulse 89  Ht 5\' 4"  (1.626 m)  Wt 305 lb (138.347 kg)  BMI 52.35 kg/m2  Constitutional: She is oriented to person, place, and time. She appears well-developed and well-nourished. No distress.  HENT: No nasal discharge.  Head: Normocephalic and atraumatic.  Eyes: Pupils are  equal, round, and reactive to light. Right eye exhibits no discharge. Left eye exhibits no discharge.  Neck: Normal range of motion. Neck supple. No JVD present. No thyromegaly present.  Cardiovascular: Normal rate, regular rhythm, normal heart sounds and intact distal pulses. Exam reveals no gallop and no friction rub.  No murmur heard.  Pulmonary/Chest: Effort normal and breath sounds normal. No stridor. No respiratory distress. She has no wheezes. She has no rales. She exhibits no tenderness.  Abdominal: Soft. Bowel sounds are normal. She exhibits no distension. There is no tenderness. There is no rebound and no guarding.  Musculoskeletal: Normal range of motion. She exhibits no edema and no tenderness.  Neurological: She is alert and oriented to person, place, and time. Coordination normal.  Skin: Skin is warm and dry. No rash noted. She is not diaphoretic. No erythema. No pallor.  Psychiatric: She has a normal mood and affect. Her behavior is normal. Judgment and thought content normal.  Right arm along the hand is still mildly swollen. no significant tenderness. Radial and ulnar pulses are normal.    ASSESSMENT AND PLAN

## 2011-03-26 ENCOUNTER — Telehealth: Payer: Self-pay | Admitting: *Deleted

## 2011-03-26 NOTE — Telephone Encounter (Signed)
Spoke with staff at [facility] to determine if appt has been scheduled w/Dr. Amanda Pea yet. Per their staff, Dr. Amanda Pea is reviewing her records to make sure he will be able to offer her something. Adria will notify us when he makes a determination.   Pt notified and verbalized understanding.

## 2011-05-08 ENCOUNTER — Ambulatory Visit: Payer: Medicare Other | Admitting: Physical Therapy

## 2011-06-13 ENCOUNTER — Ambulatory Visit (INDEPENDENT_AMBULATORY_CARE_PROVIDER_SITE_OTHER): Payer: Medicare Other | Admitting: Surgery

## 2011-06-13 ENCOUNTER — Encounter (INDEPENDENT_AMBULATORY_CARE_PROVIDER_SITE_OTHER): Payer: Self-pay | Admitting: Surgery

## 2011-06-13 VITALS — BP 134/72 | HR 97 | Temp 96.8°F | Ht 65.0 in | Wt 292.8 lb

## 2011-06-13 DIAGNOSIS — L03317 Cellulitis of buttock: Secondary | ICD-10-CM

## 2011-06-13 DIAGNOSIS — L0231 Cutaneous abscess of buttock: Secondary | ICD-10-CM | POA: Insufficient documentation

## 2011-06-13 NOTE — Progress Notes (Signed)
CENTRAL  [facility]  [name], MD,  FACS [address] [place]  [address] [place], [place]    [address] Phone:  [phone] FAX:  [phone]   Re:   TINITA BROOKER DOB:   1945-09-30 MRN:   [id number]  URGENT OFFICE  ASSESSMENT AND PLAN: 1.  Abscess, right buttocks - approx 5-6 cm  I&D in office  Packed with gauze.  To remove gauze and soak twice/day.  To see patient back next week for follow up.  2.  Patient know to me for multiple abscess of buttocks and peri-anal area, though I have not seen her since Mar 2011. 3.  Morbid obesity  She has lost 40 pounds since I last saw her.  She says this has helped her diabetes. 4.  Diabetes mellitus  Not on insulin. 5.  Using a wheel chair a lot.  I have encouraged to work to walk and not become dependent on the wheel chair. 6.  She is currently on chronic Bactrim - for 2 years. 7.  Taking baby aspirin.  To hold until I see her back.  HISTORY OF PRESENT ILLNESS: Chief Complaint  Patient presents with  . Follow-up    buttock abscess    HAZLE OGBURN is a 66 y.o. (DOB: 07-24-1945)  AA female who is a patient of Josue Hector, MD, MD and comes to me today for buttocks abscess.  She has seen Drs. Elvera Maria, and Tsuei in the past also for multiple and recurrent buttocks and peri-anal abscess.  She comes with a one-month history of right buttocks pain. She's had an abscess of her right buttocks which has drained on and off. It sounds as she was planning to have surgery in [facility] for her buttocks infections.  But she was not very specific about her plans.  PHYSICAL EXAM: BP 134/72  Pulse 97  Temp(Src) 96.8 F (36 C) (Temporal)  Ht 5\' 5"  (1.651 m)  Wt 292 lb 12.8 oz (132.813 kg)  BMI 48.72 kg/m2  SpO2 96%  Buttocks:  Multiple small tracks on both sides of anus.  But in the right buttocks, she has a 5 to 6 cm chronic abscess which is not drained.  PROCEDURE:  Painted right buttocks with betadine,  infiltrated the skin with 10 cc 1% xylocaine with epi, made a 3 cm incision into abscess, drained 50+ cc of blood and pus.  Patient is on an aspirin a day and the wound bled.  DATA REVIEWED: No new data.  [name], MD, FACS Office:  [phone]

## 2011-06-17 ENCOUNTER — Encounter (INDEPENDENT_AMBULATORY_CARE_PROVIDER_SITE_OTHER): Payer: Self-pay | Admitting: Surgery

## 2011-06-18 ENCOUNTER — Encounter (INDEPENDENT_AMBULATORY_CARE_PROVIDER_SITE_OTHER): Payer: Self-pay | Admitting: Surgery

## 2011-06-18 ENCOUNTER — Ambulatory Visit (INDEPENDENT_AMBULATORY_CARE_PROVIDER_SITE_OTHER): Payer: Medicare Other | Admitting: Surgery

## 2011-06-18 VITALS — BP 130/82 | HR 72 | Temp 96.8°F | Resp 18 | Ht 65.0 in | Wt 291.2 lb

## 2011-06-18 DIAGNOSIS — L0231 Cutaneous abscess of buttock: Secondary | ICD-10-CM

## 2011-06-18 NOTE — Progress Notes (Signed)
CEN[facility]  [name], MD,  FACS [address] [place]  [address] [place], [place]    [address] Phone:  [phone] FAX:  [phone]   Re:   Kristy FARRELLY DOB:   1946-01-19 MRN:   [id number]  ASSESSMENT AND PLAN: 1.  Abscess, right buttocks - approx 5-6 cm  I&D in office on 06/13/2011  Daughter is packing and changing dressing.  She is doing a very good job.  She will continue local wound care at home and see [place] back on a PRN basis.  2.  She is currently on chronic Bactrim - for 2 years. 3.  Morbid obesity  She has lost 40 pounds since I last saw her.  She says this has helped her diabetes. 4.  Diabetes mellitus  Not on insulin. 5.  Using a wheel chair a lot.  I have encouraged to work to walk and not become dependent on the wheel chair. She is trying to use it less.  HISTORY OF PRESENT ILLNESS: Chief Complaint  Patient presents with  . Wound Check    Buttock abscess    Kristy Harris is a 66 y.o. (DOB: [birth year])  AA female who is a patient of Josue Hector, MD, MD and comes to me today for follow up of a right  buttocks abscess.  I did an I&D on 06/13/2011 in the urgent office.  The wound is much better today.  She feels better.  She has a good understanding of the local wound care.  PHYSICAL EXAM: BP 130/82  Pulse 72  Temp(Src) 96.8 F (36 C) (Temporal)  Resp 18  Ht 5\' 5"  (1.651 m)  Wt 291 lb 4 oz (132.11 kg)  BMI 48.47 kg/m2  Buttocks:  Right buttocks with 2 x 2 cm wound.  This looks much better than 5 days ago.  I repacked the wound and went over wound care with the patient and her daughter.  DATA REVIEWED: No new data.  [name], MD, FACS Office:  [phone]

## 2011-06-23 ENCOUNTER — Encounter: Payer: Self-pay | Admitting: Cardiology

## 2011-06-23 ENCOUNTER — Ambulatory Visit (INDEPENDENT_AMBULATORY_CARE_PROVIDER_SITE_OTHER): Payer: Medicare Other | Admitting: Cardiology

## 2011-06-23 VITALS — BP 116/70 | HR 97 | Ht 64.0 in | Wt 293.0 lb

## 2011-06-23 DIAGNOSIS — R011 Cardiac murmur, unspecified: Secondary | ICD-10-CM

## 2011-06-23 DIAGNOSIS — E669 Obesity, unspecified: Secondary | ICD-10-CM

## 2011-06-23 NOTE — Assessment & Plan Note (Signed)
I prescribed the [place] Diet 

## 2011-06-23 NOTE — Assessment & Plan Note (Signed)
She does have some mild aortic stenosis. I think we could see her again in a couple of years to further evaluate this.

## 2011-06-23 NOTE — Progress Notes (Signed)
   HPI The patient presents for followup after a catheterization last year. This was initially done preoperatively. She had normal coronaries. It was a radial approach and she did have problems after she got home with discomfort and swelling. Treated with steroids and underwent physical therapy. Her symptoms have now improved. She gets chronic dyspnea with exertion but otherwise no acute cardiac complaints. Of note an echo prior to her Did demonstrate some mild aortic stenosis.  Allergies  Allergen Reactions  . Penicillins Rash    All over the body    Current Outpatient Prescriptions  Medication Sig Dispense Refill  . aspirin 81 MG tablet Take 81 mg by mouth daily.        Marland Kitchen atorvastatin (LIPITOR) 20 MG tablet Take 20 mg by mouth daily.       . cilostazol (PLETAL) 100 MG tablet Take 100 mg by mouth 2 (two) times daily.        . ergocalciferol (VITAMIN D2) 50000 UNITS capsule Take 50,000 Units by mouth once a week.       Marland Kitchen glipiZIDE (GLUCOTROL XL) 10 MG 24 hr tablet Take 10 mg by mouth daily.       Marland Kitchen lisinopril (PRINIVIL,ZESTRIL) 20 MG tablet Take 20 mg by mouth daily.        . metoprolol (TOPROL-XL) 100 MG 24 hr tablet Take 100 mg by mouth daily.        . predniSONE (DELTASONE) 20 MG tablet Take 2 tabs (40mg ) daily x 5 days only  10 tablet  0  . sulfamethoxazole-trimethoprim (BACTRIM DS) 800-160 MG per tablet Take 1 tablet by mouth daily.       . valsartan (DIOVAN) 160 MG tablet Take 160 mg by mouth daily.        . verapamil (CALAN) 120 MG tablet Take 120 mg by mouth 3 (three) times daily.          Past Medical History  Diagnosis Date  . Diabetes mellitus   . Morbid obesity   . Recurrent boils     of perineal and buttocks  . Sleep apnea   . Hypertension   . Hyperlipidemia   . Hidradenitis   . COPD (chronic obstructive pulmonary disease)   . Enlarged heart   . Anemia   . Arthritis   . Heart murmur     Past Surgical History  Procedure Date  . Pilonidal cyst excision   .  Bladder suspension   . Abdominal hysterectomy   . Abcess drainage   . Tonsillectomy   . Cardiac catheterization 02/21/2011    Normal coronary arteries, normal EF    ROS:  As stated in the HPI and negative for all other systems.  PHYSICAL EXAM Ht 5\' 4"  (1.626 m)  Wt 293 lb (132.904 kg)  BMI 50.29 kg/m2 PHYSICAL EXAM GEN:  No distress NECK:  No jugular venous distention at 90 degrees, waveform within normal limits, carotid upstroke brisk and symmetric, no bruits, no thyromegaly LYMPHATICS:  No cervical adenopathy LUNGS:  Clear to auscultation bilaterally BACK:  No CVA tenderness CHEST:  Unremarkable HEART:  S1 and S2 within normal limits, no S3, no S4, no clicks, no rubs, no murmurs ABD:  Positive bowel sounds normal in frequency in pitch, no bruits, no rebound, no guarding, unable to assess midline mass or bruit with the patient seated, morbidly obese. EXT:  2 plus pulses throughout, moderate edema, no cyanosis no clubbing, right wrist without erythema bruising swelling.   ASSESSMENT AND PLAN

## 2011-06-23 NOTE — Patient Instructions (Signed)
Your physician you to follow up in 2 years. You will receive a reminder letter in the mail one-two months in advance. If you don't receive a letter, please call our office to schedule the follow-up appointment. Your physician recommends that you continue on your current medications as directed. Please refer to the Current Medication list given to you today.   Your physician you to follow up in 2 years. You will receive a reminder letter in the mail one-two months in advance. If you don't receive a letter, please call our office to schedule the follow-up appointment.  Your physician recommends that you continue on your current medications as directed. Please refer to the Current Medication list given to you today.

## 2011-11-11 ENCOUNTER — Other Ambulatory Visit: Payer: Self-pay | Admitting: Obstetrics and Gynecology

## 2012-11-04 ENCOUNTER — Other Ambulatory Visit (INDEPENDENT_AMBULATORY_CARE_PROVIDER_SITE_OTHER): Payer: Self-pay

## 2012-11-04 ENCOUNTER — Encounter (INDEPENDENT_AMBULATORY_CARE_PROVIDER_SITE_OTHER): Payer: Self-pay | Admitting: Surgery

## 2012-11-04 ENCOUNTER — Encounter (INDEPENDENT_AMBULATORY_CARE_PROVIDER_SITE_OTHER): Payer: Self-pay

## 2012-11-04 ENCOUNTER — Ambulatory Visit (INDEPENDENT_AMBULATORY_CARE_PROVIDER_SITE_OTHER): Payer: Medicare Other | Admitting: Surgery

## 2012-11-04 DIAGNOSIS — L0231 Cutaneous abscess of buttock: Secondary | ICD-10-CM

## 2012-11-04 NOTE — Progress Notes (Addendum)
[facility]  [name], MD,  FACS [address] [place]  [address] [place], [place]    [address] Phone:  [phone] FAX:  [phone]   Re:   RHILEY TARVER DOB:   1945-09-01 MRN:   [id number]  ASSESSMENT AND PLAN: 1.  Abscess, both buttocks.  Hidradinitis.  Both sides have evidence of chronic draining sinus tracts and inflammation.  [photos at the end of the note]  I think that the areas will have to be excised, but I am not sure about the depth of the infections.  Plan: 1) MRI of pelvis/buttocks to see if I can see the anatomy better, 2) Cardiac clearance, 3) get Dr. Dorita Sciara notes, 4) will see patient back before scheduling surgery  2.  She was on chronic Bactrim, but is now off of this 3.  Morbid obesity - BMI 46.8  She has lost 20 pounds since 06/18/2011  I have encouraged her to continue to loose weight. 4.  Diabetes mellitus  Not on insulin. 5.  Using a cane to walk.  Limited mobility. 6.  Aortic stenosis, mild  Cardiac cath - 02/21/2011  Dr. Antoine Poche has seen the patient, but the last visit that I see in the computer is 06/24/2011.  Will need cardiac clearance. 7.  Chronic lower extremity edema. 8.  Had trouble with right arm after right radial art cath  It appears that this has resolved. [Cardiac clearance:  1. Aortic stenosis: Mild AS on 12/12 echo. Murmur does not sound severe. Given plan for surgery, will get echo to make sure that AS has not worsened.  2. HTN: She is on a rather complicated regimen with multiple BP meds. She is on both valsartan and lisinopril. I will have her stop valsartan and increase lisinopril to 40 mg daily.  3. Exertional dyspnea: She is not very active. She is not volume overloaded on exam. No exertional chest pain. Normal cath in 12/12. Sinus tachycardia with activity. I think that she probably is deconditioned. I encouraged her to walk more.  4. Patient is using cilostazol. She has no history that I can find of PAD.  She denies claudication now or in the past. I will have her ask her PCP if it is really necessary for her to take cilostazol.  5. Pre-operative evaluation: Patient is planned for low risk surgery (drainage of buttocks abscess). I think that she can go on to surgery without further cardiac workup other than echo to follow AS.  Marca Ancona      ----------     DN 11/14/2012]  HISTORY OF PRESENT ILLNESS: Chief Complaint  Patient presents with  . New Evaluation    eval clusters of abscesses   JADAYA SOMMERFIELD is a 67 y.o. (DOB: [birth year])  AA female who is a patient of Josue Hector, MD and comes to me today for evaluation of bilateral buttocks abscess.  Both her daughter and grand daughter are with her.  I last saw Ms. Randle last April 2013, when I did an I&D of her right buttocks on 06/13/2011 in the urgent office.   She has seen Dr. Terri Piedra over the last year.  He has treated her with Accutane x 6 months, until March 2014.  The he switched he to another medicine, which she does not remember the name of.  She said that she got "shots" in May and June - but she is unsure of what she was getting.  She is supposed to see Dr. Terri Piedra  back on 11/19/2012. She has wounds of both buttocks which are painful and draining. She has lost some more weight since I saw her last year. She does have [facility] come by her home every two months to check her diabetes.  Current Outpatient Prescriptions  Medication Sig Dispense Refill  . aspirin 81 MG tablet Take 81 mg by mouth daily.        Marland Kitchen atorvastatin (LIPITOR) 20 MG tablet Take 20 mg by mouth daily.       . cilostazol (PLETAL) 100 MG tablet Take 100 mg by mouth 2 (two) times daily.        Marland Kitchen CLARAVIS 40 MG capsule       . ergocalciferol (VITAMIN D2) 50000 UNITS capsule Take 50,000 Units by mouth once a week.       Marland Kitchen glipiZIDE (GLUCOTROL XL) 10 MG 24 hr tablet Take 10 mg by mouth daily.       Marland Kitchen HYDROcodone-acetaminophen (NORCO/VICODIN) 5-325 MG per tablet         . lisinopril (PRINIVIL,ZESTRIL) 20 MG tablet Take 20 mg by mouth daily.        . metFORMIN (GLUCOPHAGE-XR) 500 MG 24 hr tablet       . metoprolol (TOPROL-XL) 100 MG 24 hr tablet Take 100 mg by mouth daily.        . mupirocin ointment (BACTROBAN) 2 %       . predniSONE (DELTASONE) 20 MG tablet Take 2 tabs (40mg ) daily x 5 days only  10 tablet  0  . sodium chloride irrigation 0.9 % irrigation       . valsartan (DIOVAN) 160 MG tablet Take 160 mg by mouth daily.        . verapamil (CALAN) 120 MG tablet Take 120 mg by mouth 3 (three) times daily.         No current facility-administered medications for this visit.    Social History: Unmarried.   Grandson lives with her at night.  He's in school. Daughter, Velna Hatchet, and granddaughter, Roda Shutters, are with patient.  PHYSICAL EXAM: BP 130/82  Pulse 100  Resp 28  Ht 5\' 4"  (1.626 m)  Wt 272 lb 9.6 oz (123.651 kg)  BMI 46.77 kg/m2  General: Obese AA F who is alert.  She uses a cane to walk  HEENT: Normal. Pupils equal. Neck: Supple. No mass.  No thyroid mass.  Lymph Nodes:  No supraclavicular or cervical nodes. Lungs: Clear to auscultation and symmetric breath sounds. Heart:  RRR. She has a 3/6 systolic murmur heard at the right sternal border the best. Abdomen: Soft. No mass. No tenderness.  Normal bowel sounds.  Buttocks:  Left sided 4 to 5 cm inflammation with draining sinus.  Two draining areas on the left, the upper one about 3 to 4 cm and a larger one inferiorly about 6 to 7 cm that has granulation tissue about 2 x 5 cm. [Photos taken] Extremities:  Uses a cane to walk with 1+ edema of LE. Neurologic:  Grossly intact to motor and sensory function. Psychiatric:  Behavior is normal.      Buttocks infection with granulation tissue.  DATA REVIEWED: Epic notes.  [name], MD, FACS Office:  [phone]

## 2012-11-09 ENCOUNTER — Telehealth: Payer: Self-pay | Admitting: *Deleted

## 2012-11-09 ENCOUNTER — Ambulatory Visit (HOSPITAL_COMMUNITY)
Admission: RE | Admit: 2012-11-09 | Discharge: 2012-11-09 | Disposition: A | Discharge status: Home / Self Care | Payer: Medicare Other | Source: Ambulatory Visit | Attending: Surgery | Admitting: Surgery

## 2012-11-09 DIAGNOSIS — R599 Enlarged lymph nodes, unspecified: Secondary | ICD-10-CM | POA: Insufficient documentation

## 2012-11-09 DIAGNOSIS — N838 Other noninflammatory disorders of ovary, fallopian tube and broad ligament: Secondary | ICD-10-CM | POA: Insufficient documentation

## 2012-11-09 DIAGNOSIS — L0231 Cutaneous abscess of buttock: Secondary | ICD-10-CM | POA: Insufficient documentation

## 2012-11-09 LAB — CREATININE, SERUM: GFR calc Af Amer: >90 mL/min (ref >90)

## 2012-11-09 MED ORDER — GADOBENATE DIMEGLUMINE 529 MG/ML IV SOLN
20.0000 mL | Freq: Once | INTRAVENOUS | Status: AC | PRN
  Administered 2012-11-09: 20 mL via INTRAVENOUS

## 2012-11-09 NOTE — Telephone Encounter (Signed)
Letter received from Dr. Allene Pyo office that the patient is needing surgical clearance for cyst removal to the buttocks under general anesthesia. She was last seen by Dr. Antoine Poche in April 2013, I have attempted to contact the patient regarding the need for an appointment for clearance prior to surgery. No answer at her contact # and # states voice mail not set up. We will need to call the patient back. Will forward to triage with paper work. Sherri Rad, RN, BSN  Patient daughter called back as she had seen our office call. She is aware of the patient's need for surgery. I have made her aware that due to the need for general anesthesia, that she will need an office visit for clearance. She is in quite a bit of pain. Dr. Antoine Poche is out this week with no availability on his return. Offered appointment on 9/11 with Dr. Graciela Husbands (DOD), but declined to to another MD appointment. She will come on 9/12 to see Dr. Shirlee Latch at 11:45 am (DOD). The patient's daughter states the patient is in an MRI now. She will have the patient call back if this appointment doesn't work for her. Letter from Dr. Allene Pyo office placed in file in scheduling. Sherri Rad, RN, BSN

## 2012-11-11 ENCOUNTER — Encounter: Payer: Self-pay | Admitting: *Deleted

## 2012-11-12 ENCOUNTER — Encounter: Payer: Self-pay | Admitting: Cardiology

## 2012-11-12 ENCOUNTER — Ambulatory Visit (INDEPENDENT_AMBULATORY_CARE_PROVIDER_SITE_OTHER): Payer: Medicare Other | Admitting: Cardiology

## 2012-11-12 VITALS — BP 127/70 | HR 106 | Ht 64.0 in | Wt 269.8 lb

## 2012-11-12 DIAGNOSIS — I359 Nonrheumatic aortic valve disorder, unspecified: Secondary | ICD-10-CM

## 2012-11-12 DIAGNOSIS — R0600 Dyspnea, unspecified: Secondary | ICD-10-CM

## 2012-11-12 DIAGNOSIS — R0609 Other forms of dyspnea: Secondary | ICD-10-CM

## 2012-11-12 DIAGNOSIS — Z01810 Encounter for preprocedural cardiovascular examination: Secondary | ICD-10-CM

## 2012-11-12 DIAGNOSIS — E785 Hyperlipidemia, unspecified: Secondary | ICD-10-CM

## 2012-11-12 DIAGNOSIS — L0231 Cutaneous abscess of buttock: Secondary | ICD-10-CM

## 2012-11-12 DIAGNOSIS — I10 Essential (primary) hypertension: Secondary | ICD-10-CM

## 2012-11-12 DIAGNOSIS — I350 Nonrheumatic aortic (valve) stenosis: Secondary | ICD-10-CM

## 2012-11-12 DIAGNOSIS — R011 Cardiac murmur, unspecified: Secondary | ICD-10-CM

## 2012-11-12 MED ORDER — LISINOPRIL 40 MG PO TABS: 40.0000 mg | ORAL_TABLET | Freq: Every day | ORAL | Status: DC

## 2012-11-12 NOTE — Patient Instructions (Addendum)
Stop valsartan.   Increase lisinopril to 40mg  daily.   Your physician has requested that you have an echocardiogram. Echocardiography is a painless test that uses sound waves to create images of your heart. It provides your doctor with information about the size and shape of your heart and how well your heart's chambers and valves are working. This procedure takes approximately one hour. There are no restrictions for this procedure. If this is OK you can have your surgery.   Your physician recommends that you schedule a follow-up appointment in: 6 months in the [place] office. You had seen Dr Antoine Poche there.   Ask your doctor about cilostazol (pletal).

## 2012-11-13 NOTE — Progress Notes (Signed)
Patient ID: Kristy Harris, female   DOB: 08-01-1945, 67 y.o.   MRN: [id number] PCP: Dr. Lysbeth Galas  66 yo with history of chronic exertional dyspnea, mild aortic stenosis, HTN/DM, and hidradenitis suppuritiva presents for cardiology followup.  She was last seen by Dr. Antoine Poche.  She needs surgical drainage of extensive buttocks abscess and was sent here for cardiology evaluation prior to surgery.    She has chronic exertional dyspnea.  She had a workup in 12/12 with normal cath and echo showing normal EF in 12/12.  She continues to be short of breath after walking < 1/2 block.  She cannot climb steps.  No chest pain.  She also has limiting left knee arthritis. She has Lasix at home but rarely uses it. Her heart rate goes up a lot when she walks (was 106 bpm when she arrived today).    ECG: sinus tachy at 106, inferior and anterolateral Qs.    Labs (9/14): creatinine 0.62  PMH: 1. Hidradenitis suppuritiva 2. Obesity 3. Type II diabetes 4. HTN 5. Chronic exertional dyspnea: Echo (12/12) with EF 60-65%, mild aortic stenosis with mean gradient 13 mmHg.  LHC (12/12): normal coronaries.  6. Aortic stenosis: Mild on 12/12 echo.  7. OA: right knee.   SH: Nonsmoker, lives in [place].  FH: No premature CAD  ROS: All systems reviewed and negative except as per HPI.   Current Outpatient Prescriptions  Medication Sig Dispense Refill  . aspirin 81 MG tablet Take 81 mg by mouth daily.        Marland Kitchen atorvastatin (LIPITOR) 20 MG tablet Take 20 mg by mouth daily.       . cilostazol (PLETAL) 100 MG tablet Take 100 mg by mouth 2 (two) times daily.        . ergocalciferol (VITAMIN D2) 50000 UNITS capsule Take 50,000 Units by mouth once a week.       Marland Kitchen glipiZIDE (GLUCOTROL XL) 10 MG 24 hr tablet Take 10 mg by mouth daily.       Marland Kitchen HYDROcodone-acetaminophen (NORCO/VICODIN) 5-325 MG per tablet       . metFORMIN (GLUCOPHAGE-XR) 500 MG 24 hr tablet Take 500 mg by mouth 2 (two) times daily.       . metoprolol  (TOPROL-XL) 100 MG 24 hr tablet Take 100 mg by mouth daily.        . mupirocin ointment (BACTROBAN) 2 %       . verapamil (CALAN) 120 MG tablet Take 120 mg by mouth 3 (three) times daily.        Marland Kitchen lisinopril (PRINIVIL,ZESTRIL) 40 MG tablet Take 1 tablet (40 mg total) by mouth daily.  90 tablet  3   No current facility-administered medications for this visit.    BP 127/70  Pulse 106  Ht 5\' 4"  (1.626 m)  Wt 122.38 kg (269 lb 12.8 oz)  BMI 46.29 kg/m2 General: NAD Neck: No JVD, no thyromegaly or thyroid nodule.  Lungs: Clear to auscultation bilaterally with normal respiratory effort. CV: Nondisplaced PMI.  Heart regular S1/S2, no S3/S4, 2/6 early SEM RUSB.  No peripheral edema.  No carotid bruit.  Normal pedal pulses (feet warm).  Abdomen: Soft, nontender, no hepatosplenomegaly, no distention.  Skin: Intact without lesions or rashes.  Neurologic: Alert and oriented x 3.  Psych: Normal affect. Extremities: No clubbing or cyanosis.   Assessment/Plan: 1. Aortic stenosis: Mild AS on 12/12 echo.  Murmur does not sound severe.  Given plan for surgery, will get echo to make  sure that AS has not worsened.   2. HTN: She is on a rather complicated regimen with multiple BP meds.  She is on both valsartan and lisinopril.  I will have her stop valsartan and increase lisinopril to 40 mg daily.   3. Exertional dyspnea: She is not very active.  She is not volume overloaded on exam.  No exertional chest pain.  Normal cath in 12/12.  Sinus tachycardia with activity.  I think that she probably is deconditioned.  I encouraged her to walk more.  4. Patient is using cilostazol.  She has no history that I can find of PAD.  She denies claudication now or in the past. I will have her ask her PCP if it is really necessary for her to take cilostazol.   5. Pre-operative evaluation: Patient is planned for low risk surgery (drainage of buttocks abscess).  I think that she can go on to surgery without further cardiac  workup other than echo to follow AS.    Marca Ancona 11/13/2012

## 2012-11-15 ENCOUNTER — Encounter (INDEPENDENT_AMBULATORY_CARE_PROVIDER_SITE_OTHER): Payer: Self-pay

## 2012-11-24 ENCOUNTER — Telehealth (INDEPENDENT_AMBULATORY_CARE_PROVIDER_SITE_OTHER): Payer: Self-pay

## 2012-11-24 NOTE — Telephone Encounter (Signed)
Patient states no change in  the abscess on buttocks, she is waiting for appointment to have her Echo done. Advised her to call after she has the echo so we could get her scheduled with DR. Newman . Patient verbalized understanding

## 2012-11-26 ENCOUNTER — Telehealth (INDEPENDENT_AMBULATORY_CARE_PROVIDER_SITE_OTHER): Payer: Self-pay

## 2012-11-26 NOTE — Telephone Encounter (Signed)
Patient aware of appt 12-09-12 12n with Dr. Ezzard Standing

## 2012-11-29 ENCOUNTER — Other Ambulatory Visit (INDEPENDENT_AMBULATORY_CARE_PROVIDER_SITE_OTHER): Payer: Self-pay

## 2012-11-30 ENCOUNTER — Encounter (INDEPENDENT_AMBULATORY_CARE_PROVIDER_SITE_OTHER): Payer: Self-pay

## 2012-12-08 ENCOUNTER — Other Ambulatory Visit (HOSPITAL_COMMUNITY): Payer: Self-pay | Admitting: Cardiology

## 2012-12-08 ENCOUNTER — Ambulatory Visit (HOSPITAL_COMMUNITY): Payer: Medicare Other | Attending: Cardiology | Admitting: Radiology

## 2012-12-08 DIAGNOSIS — E119 Type 2 diabetes mellitus without complications: Secondary | ICD-10-CM | POA: Insufficient documentation

## 2012-12-08 DIAGNOSIS — R0609 Other forms of dyspnea: Secondary | ICD-10-CM | POA: Insufficient documentation

## 2012-12-08 DIAGNOSIS — I079 Rheumatic tricuspid valve disease, unspecified: Secondary | ICD-10-CM | POA: Insufficient documentation

## 2012-12-08 DIAGNOSIS — R011 Cardiac murmur, unspecified: Secondary | ICD-10-CM

## 2012-12-08 DIAGNOSIS — R0989 Other specified symptoms and signs involving the circulatory and respiratory systems: Secondary | ICD-10-CM | POA: Insufficient documentation

## 2012-12-08 DIAGNOSIS — I359 Nonrheumatic aortic valve disorder, unspecified: Secondary | ICD-10-CM

## 2012-12-08 DIAGNOSIS — I10 Essential (primary) hypertension: Secondary | ICD-10-CM | POA: Insufficient documentation

## 2012-12-08 DIAGNOSIS — R0602 Shortness of breath: Secondary | ICD-10-CM | POA: Insufficient documentation

## 2012-12-08 DIAGNOSIS — Z01810 Encounter for preprocedural cardiovascular examination: Secondary | ICD-10-CM | POA: Insufficient documentation

## 2012-12-08 DIAGNOSIS — I350 Nonrheumatic aortic (valve) stenosis: Secondary | ICD-10-CM

## 2012-12-08 DIAGNOSIS — E785 Hyperlipidemia, unspecified: Secondary | ICD-10-CM | POA: Insufficient documentation

## 2012-12-08 NOTE — Progress Notes (Signed)
Echocardiogram performed.  

## 2012-12-09 ENCOUNTER — Telehealth (INDEPENDENT_AMBULATORY_CARE_PROVIDER_SITE_OTHER): Payer: Self-pay | Admitting: Surgery

## 2012-12-09 ENCOUNTER — Encounter (INDEPENDENT_AMBULATORY_CARE_PROVIDER_SITE_OTHER): Payer: Self-pay | Admitting: Surgery

## 2012-12-09 ENCOUNTER — Other Ambulatory Visit (INDEPENDENT_AMBULATORY_CARE_PROVIDER_SITE_OTHER): Payer: Self-pay | Admitting: Surgery

## 2012-12-09 ENCOUNTER — Telehealth (INDEPENDENT_AMBULATORY_CARE_PROVIDER_SITE_OTHER): Payer: Self-pay

## 2012-12-09 ENCOUNTER — Ambulatory Visit (INDEPENDENT_AMBULATORY_CARE_PROVIDER_SITE_OTHER): Payer: Medicare Other | Admitting: Surgery

## 2012-12-09 VITALS — BP 132/76 | HR 60 | Temp 98.0°F | Resp 18 | Ht 64.0 in | Wt 266.0 lb

## 2012-12-09 DIAGNOSIS — L0231 Cutaneous abscess of buttock: Secondary | ICD-10-CM

## 2012-12-09 NOTE — Progress Notes (Signed)
[facility]  [name], MD,  FACS [address] [place]  [address] [place], [place]    [address] Phone:  [phone] FAX:  [phone]   Re:   Kristy Harris DOB:   Oct 23, 1945 MRN:   [id number]  ASSESSMENT AND PLAN: 1.  Abscess, both buttocks.  Hidradinitis.  Both sides have evidence of chronic draining sinus tracts and inflammation.  The right is worse than the left. [photos at the end of the note from 11/04/2012]  Plan: 1)  To schedule excision of her right buttocks chronic inflammation.  I discussed the indications and potential complications of the surgery.  The risks include, but are not limited to, bleeding, recurrent infection, reformation of the sinuses, and a non healing wound.  There is no easy answer for what she has.  I will leave her wound open, so she will need daily dressing changes.  Because of the location, I don't think that skin graft will be an option to cover the area and the wound will have to heal by secondary intention.  She already has a home health care coming out to her house to check her blood sugars.  I told her the buttocks wound could take 2 to 6 months to heal.  I would not operate on the left side until the right side is healed.  2.  She was on chronic Bactrim, but is now off of this 3.  Morbid obesity - BMI 46.8  She has lost 20 pounds since 06/18/2011  She is continuing to loose weight, which I am proud of her for. 4.  Diabetes mellitus  Not on insulin. 5.  Using a cane to walk.  Limited mobility. 6.  Aortic stenosis, mild  Cardiac cath - 02/21/2011  Dr. Antoine Poche has seen the patient, but the last visit that I see in the computer is 06/24/2011.  "I think that she can go on to surgery without further cardiac workup other than echo to follow AS."  Marca Ancona.  11/14/2012 7.  Chronic lower extremity edema. 8.  Had trouble with right arm after right radial art cath  It appears that this has resolved.  HISTORY OF PRESENT  ILLNESS: Chief Complaint  Patient presents with  . Routine Post Op    buttock abscess   Kristy Harris is a 67 y.o. (DOB: [birth year])  AA female who is a patient of Josue Hector, MD and comes to me today for follow up of an evaluation of bilateral buttocks abscess.   I reviewed the MRI from 11/09/2012 with her.  There is no obvious deep track to this infection and it appears that much of this is at the skin level.  We talked about the options.  I think she is best served with continued weight loss before surgery.  But the right buttocks particularly is hurting her and she wants to go ahead with surgery on the right.  I stressed the importance of her continued weight loss.  Her mobility is limited because of her legs.  The weight loss will help, but will not resolve her physical problems.  She has a someone who comes from home health daily.  We would depend on them to help with her post op dressing changes.   We also talked about the limits of surgery - and that surgery may not cure this problem.  History of buttock infection: I last saw Kristy Harris last April 2013, when I did an I&D of her right buttocks on  06/13/2011 in the urgent office.   She has seen Dr. Terri Piedra over the last year.  He has treated her with Accutane x 6 months, until March 2014.  The he switched he to another medicine, which she does not remember the name of.  She said that she got "shots" in May and June - but she is unsure of what she was getting.  She is supposed to see Dr. Terri Piedra back on 11/19/2012. She has wounds of both buttocks which are painful and draining. She has lost some more weight since I saw her last year. She does have [facility] come by her home every two months to check her diabetes.  Current Outpatient Prescriptions  Medication Sig Dispense Refill  . aspirin 81 MG tablet Take 81 mg by mouth daily.        Marland Kitchen atorvastatin (LIPITOR) 20 MG tablet Take 20 mg by mouth daily.       . ergocalciferol (VITAMIN D2)  50000 UNITS capsule Take 50,000 Units by mouth once a week.       . fluconazole (DIFLUCAN) 150 MG tablet       . glipiZIDE (GLUCOTROL XL) 10 MG 24 hr tablet Take 10 mg by mouth daily.       Marland Kitchen HYDROcodone-acetaminophen (NORCO/VICODIN) 5-325 MG per tablet       . lisinopril (PRINIVIL,ZESTRIL) 40 MG tablet Take 1 tablet (40 mg total) by mouth daily.  90 tablet  3  . metFORMIN (GLUCOPHAGE-XR) 500 MG 24 hr tablet Take 500 mg by mouth 2 (two) times daily.       . metoprolol (TOPROL-XL) 100 MG 24 hr tablet Take 100 mg by mouth daily.        . mupirocin ointment (BACTROBAN) 2 %       . verapamil (CALAN) 120 MG tablet Take 120 mg by mouth 3 (three) times daily.        . cilostazol (PLETAL) 100 MG tablet Take 100 mg by mouth 2 (two) times daily.        Marland Kitchen DIOVAN 160 MG tablet        No current facility-administered medications for this visit.   Review of Systems: Cardiology - 1. Aortic stenosis: Mild AS on 12/12 echo. Murmur does not sound severe. Given plan for surgery, will get echo to make sure that AS has not worsened.   2. HTN: She is on a rather complicated regimen with multiple BP meds. She is on both valsartan and lisinopril. I will have her stop valsartan and increase lisinopril to 40 mg daily.   3. Exertional dyspnea: She is not very active. She is not volume overloaded on exam. No exertional chest pain. Normal cath in 12/12. Sinus tachycardia with activity. I think that she probably is deconditioned. I encouraged her to walk more.   4. Patient is using cilostazol. She has no history that I can find of PAD. She denies claudication now or in the past. I will have her ask her PCP if it is really necessary for her to take cilostazol.   5. Pre-operative evaluation: Patient is planned for low risk surgery (drainage of buttocks abscess). I think that she can go on to surgery without further cardiac workup other than echo to follow AS. Dalton Shirlee Latch   Social History: Unmarried.   Grandson lives with  her at night.  He's in school. Daughter, Velna Hatchet, and granddaughter, Roda Shutters, have come with patient in the past  PHYSICAL EXAM: BP 132/76  Pulse 60  Temp(Src)  98 F (36.7 C)  Resp 18  Ht 5\' 4"  (1.626 m)  Wt 266 lb (120.657 kg)  BMI 45.64 kg/m2  General: Obese AA F who is alert.  She uses a cane to walk  Buttocks:  Left sided 4 to 5 cm inflammation with draining sinus.  Two draining areas on the right\, the upper one about 3 to 4 cm and a larger one inferiorly about 6 to 7 cm that has granulation tissue about 2 x 5 cm. [Photos in chart for 11/04/2012 visit] Extremities:  Uses a cane to walk with 1+ edema of LE.  DATA REVIEWED: Epic notes.  [name], MD, FACS Office:  [phone]

## 2012-12-09 NOTE — Telephone Encounter (Signed)
Pt scheduled for surgery 10/23 [facility]  Orders had comment re see 1 week p sx /please schedule

## 2012-12-09 NOTE — Telephone Encounter (Signed)
Appt 12-29-12@10 :30 with Dr. Ezzard Standing

## 2012-12-14 NOTE — Progress Notes (Signed)
Echo 12-08-12 epic Carotid doppler 02-04-11 epic Rest lexiscan 02-03-11 epic ekg 11-12-12 epic Lov/cardiac clearance note dr Shirlee Latch 11-12-12 epic

## 2012-12-15 ENCOUNTER — Ambulatory Visit (HOSPITAL_COMMUNITY)
Admission: RE | Admit: 2012-12-15 | Discharge: 2012-12-15 | Disposition: A | Discharge status: Home / Self Care | Payer: Medicare Other | Source: Ambulatory Visit | Attending: Surgery | Admitting: Surgery

## 2012-12-15 ENCOUNTER — Encounter (HOSPITAL_COMMUNITY): Payer: Self-pay

## 2012-12-15 ENCOUNTER — Telehealth: Payer: Self-pay | Admitting: Cardiology

## 2012-12-15 ENCOUNTER — Encounter (HOSPITAL_COMMUNITY): Payer: Self-pay | Admitting: Pharmacy Technician

## 2012-12-15 ENCOUNTER — Encounter (HOSPITAL_COMMUNITY)
Admission: RE | Admit: 2012-12-15 | Discharge: 2012-12-15 | Disposition: A | Discharge status: Home / Self Care | Payer: Medicare Other | Source: Ambulatory Visit | Attending: Surgery | Admitting: Surgery

## 2012-12-15 DIAGNOSIS — Z01818 Encounter for other preprocedural examination: Secondary | ICD-10-CM | POA: Insufficient documentation

## 2012-12-15 DIAGNOSIS — L0231 Cutaneous abscess of buttock: Secondary | ICD-10-CM | POA: Insufficient documentation

## 2012-12-15 DIAGNOSIS — Z01812 Encounter for preprocedural laboratory examination: Secondary | ICD-10-CM | POA: Insufficient documentation

## 2012-12-15 DIAGNOSIS — I10 Essential (primary) hypertension: Secondary | ICD-10-CM | POA: Insufficient documentation

## 2012-12-15 HISTORY — DX: Frequency of micturition: R35.0

## 2012-12-15 LAB — COMPREHENSIVE METABOLIC PANEL
ALT: 8 U/L (ref 0–35)
Alkaline Phosphatase: 99 U/L (ref 39–117)
CO2: 28 mEq/L (ref 19–32)
Chloride: 101 mEq/L (ref 96–112)
Creatinine, Ser: 0.63 mg/dL (ref 0.50–1.10)
GFR calc Af Amer: >90 mL/min (ref >90)
GFR calc non Af Amer: >90 mL/min (ref >90)
Glucose, Bld: 181 mg/dL — ABNORMAL HIGH (ref 70–99)
Potassium: 4.3 mEq/L (ref 3.5–5.1)
Sodium: 135 mEq/L (ref 135–145)
Total Protein: 8.1 g/dL (ref 6.0–8.3)

## 2012-12-15 LAB — CBC WITH DIFFERENTIAL/PLATELET
Lymphocytes Relative: 29 % (ref 12–46)
Lymphs Abs: 2.8 10*3/uL (ref 0.7–4.0)
Neutro Abs: 6.1 10*3/uL (ref 1.7–7.7)
Neutrophils Relative %: 62 % (ref 43–77)
Platelets: 378 10*3/uL (ref 150–400)
RBC: 3.91 MIL/uL (ref 3.87–5.11)
WBC: 9.7 10*3/uL (ref 4.0–10.5)

## 2012-12-15 NOTE — Progress Notes (Signed)
Cbc with dif results routed to dr Yehuda Mao by epic

## 2012-12-15 NOTE — Telephone Encounter (Signed)
Received request from Nurse, documents faxed for surgical clearance. To: [facility] Fax number: [phone] Attention: 12/15/12/KM

## 2012-12-15 NOTE — Patient Instructions (Addendum)
20 Kristy Harris  12/15/2012   Your procedure is scheduled on: 12-23-2012  Report to [facility] at 830 AM.  Call this number if you have problems the morning of surgery [phone]   Remember:   Do not eat food or drink liquids :After Midnight.     Take these medicines the morning of surgery with A SIP OF WATER: hydrocodone if needed, albuterol inhaler if needed, bring inhaler and leave with your daughter sheila, atorvostatin, metoprolol, verapamil                                SEE [name] PREPARING FOR SURGERY SHEET             You may not have any metal on your body including hair pins and piercings  Do not wear jewelry, make-up.  Do not wear lotions, powders, or perfumes. You may wear deodorant.   Men may shave face and neck.  Do not bring valuables to the hospital. [facility] IS NOT RESPONSIBLE FOR VALUEABLES.  Contacts, dentures or bridgework may not be worn into surgery.  Leave suitcase in the car. After surgery it may be brought to your room.  For patients admitted to the hospital, checkout time is 11:00 AM the day of discharge.   Patients discharged the day of surgery will not be allowed to drive home.  Name and phone number of your driver:  Special Instructions: N/A   Please read over the following fact sheets that you were given:   Call Cain Sieve RN pre op nurse if needed [phone]    FAILURE TO FOLLOW THESE INSTRUCTIONS MAY RESULT IN THE CANCELLATION OF YOUR SURGERY.  PATIENT SIGNATURE___________________________________________  NURSE SIGNATURE_____________________________________________

## 2012-12-23 ENCOUNTER — Encounter (HOSPITAL_COMMUNITY)
Admission: RE | Disposition: A | Discharge status: Home Health | Payer: Self-pay | Source: Ambulatory Visit | Attending: Surgery

## 2012-12-23 ENCOUNTER — Inpatient Hospital Stay (HOSPITAL_COMMUNITY)
Admission: RE | Admit: 2012-12-23 | Discharge: 2012-12-25 | DRG: 603 | Disposition: A | Discharge status: Home Health | Payer: Medicare Other | Source: Ambulatory Visit | Attending: Surgery | Admitting: Surgery

## 2012-12-23 ENCOUNTER — Inpatient Hospital Stay (HOSPITAL_COMMUNITY): Payer: Medicare Other | Admitting: Anesthesiology

## 2012-12-23 ENCOUNTER — Encounter (HOSPITAL_COMMUNITY): Payer: Self-pay | Admitting: *Deleted

## 2012-12-23 ENCOUNTER — Encounter (HOSPITAL_COMMUNITY): Payer: Medicare Other | Admitting: Anesthesiology

## 2012-12-23 DIAGNOSIS — R609 Edema, unspecified: Secondary | ICD-10-CM | POA: Diagnosis present

## 2012-12-23 DIAGNOSIS — L732 Hidradenitis suppurativa: Secondary | ICD-10-CM | POA: Diagnosis present

## 2012-12-23 DIAGNOSIS — Z6841 Body Mass Index (BMI) 40.0 and over, adult: Secondary | ICD-10-CM

## 2012-12-23 DIAGNOSIS — I359 Nonrheumatic aortic valve disorder, unspecified: Secondary | ICD-10-CM | POA: Diagnosis not present

## 2012-12-23 DIAGNOSIS — E119 Type 2 diabetes mellitus without complications: Secondary | ICD-10-CM | POA: Diagnosis present

## 2012-12-23 DIAGNOSIS — L0231 Cutaneous abscess of buttock: Secondary | ICD-10-CM

## 2012-12-23 DIAGNOSIS — Z79899 Other long term (current) drug therapy: Secondary | ICD-10-CM

## 2012-12-23 DIAGNOSIS — L03317 Cellulitis of buttock: Secondary | ICD-10-CM

## 2012-12-23 HISTORY — PX: IRRIGATION AND DEBRIDEMENT ABSCESS: SHX5252

## 2012-12-23 LAB — GLUCOSE, CAPILLARY: Glucose-Capillary: 77 mg/dL (ref 70–99)

## 2012-12-23 SURGERY — IRRIGATION AND DEBRIDEMENT ABSCESS
Anesthesia: General | Site: Buttocks | Laterality: Right | Wound class: Dirty or Infected

## 2012-12-23 MED ORDER — NEOSTIGMINE METHYLSULFATE 1 MG/ML IJ SOLN
INTRAMUSCULAR | Status: DC | PRN
  Administered 2012-12-23: 2 mg via INTRAVENOUS

## 2012-12-23 MED ORDER — METHYLENE BLUE 1 % INJ SOLN
INTRAMUSCULAR | Status: AC
  Filled 2012-12-23: qty 10

## 2012-12-23 MED ORDER — LIDOCAINE-EPINEPHRINE 1 %-1:100000 IJ SOLN
INTRAMUSCULAR | Status: AC
  Filled 2012-12-23: qty 1

## 2012-12-23 MED ORDER — GLYCOPYRROLATE 0.2 MG/ML IJ SOLN
INTRAMUSCULAR | Status: DC | PRN
  Administered 2012-12-23: 0.4 mg via INTRAVENOUS
  Administered 2012-12-23: 0.2 mg via INTRAVENOUS

## 2012-12-23 MED ORDER — VERAPAMIL HCL 120 MG PO TABS
120.0000 mg | ORAL_TABLET | Freq: Every day | ORAL | Status: DC
  Administered 2012-12-23 – 2012-12-25 (×3): 120 mg via ORAL
  Filled 2012-12-23 (×3): qty 1

## 2012-12-23 MED ORDER — ALBUTEROL SULFATE HFA 108 (90 BASE) MCG/ACT IN AERS: 2.0000 | INHALATION_SPRAY | RESPIRATORY_TRACT | Status: DC | PRN

## 2012-12-23 MED ORDER — METOPROLOL SUCCINATE ER 100 MG PO TB24
100.0000 mg | ORAL_TABLET | Freq: Every day | ORAL | Status: DC
  Administered 2012-12-23: 100 mg via ORAL
  Filled 2012-12-23 (×2): qty 1

## 2012-12-23 MED ORDER — GLIPIZIDE ER 10 MG PO TB24
10.0000 mg | ORAL_TABLET | Freq: Every day | ORAL | Status: DC
  Administered 2012-12-24 – 2012-12-25 (×2): 10 mg via ORAL
  Filled 2012-12-23 (×4): qty 1

## 2012-12-23 MED ORDER — HYDROCODONE-ACETAMINOPHEN 5-325 MG PO TABS
1.0000 | ORAL_TABLET | Freq: Three times a day (TID) | ORAL | Status: DC | PRN
  Administered 2012-12-24 – 2012-12-25 (×4): 1 via ORAL
  Filled 2012-12-23 (×4): qty 1

## 2012-12-23 MED ORDER — POTASSIUM CHLORIDE IN NACL 20-0.45 MEQ/L-% IV SOLN
INTRAVENOUS | Status: DC
  Administered 2012-12-23: 23:00:00 via INTRAVENOUS
  Filled 2012-12-23 (×5): qty 1000

## 2012-12-23 MED ORDER — METFORMIN HCL ER 500 MG PO TB24
500.0000 mg | ORAL_TABLET | Freq: Two times a day (BID) | ORAL | Status: DC
  Administered 2012-12-23 – 2012-12-25 (×4): 500 mg via ORAL
  Filled 2012-12-23 (×6): qty 1

## 2012-12-23 MED ORDER — LIDOCAINE HCL (CARDIAC) 20 MG/ML IV SOLN
INTRAVENOUS | Status: DC | PRN
  Administered 2012-12-23: 60 mg via INTRAVENOUS

## 2012-12-23 MED ORDER — LACTATED RINGERS IV SOLN
INTRAVENOUS | Status: DC | PRN
  Administered 2012-12-23 (×2): via INTRAVENOUS

## 2012-12-23 MED ORDER — PROMETHAZINE HCL 25 MG/ML IJ SOLN: 6.2500 mg | INTRAMUSCULAR | Status: DC | PRN

## 2012-12-23 MED ORDER — ONDANSETRON HCL 4 MG PO TABS: 4.0000 mg | ORAL_TABLET | Freq: Four times a day (QID) | ORAL | Status: DC | PRN

## 2012-12-23 MED ORDER — METHYLENE BLUE 1 % INJ SOLN
INTRAMUSCULAR | Status: DC | PRN
  Administered 2012-12-23: 10 mL via SUBMUCOSAL

## 2012-12-23 MED ORDER — MORPHINE SULFATE 2 MG/ML IJ SOLN
1.0000 mg | INTRAMUSCULAR | Status: DC | PRN
  Administered 2012-12-23 – 2012-12-24 (×4): 2 mg via INTRAVENOUS
  Filled 2012-12-23 (×3): qty 1

## 2012-12-23 MED ORDER — ONDANSETRON HCL 4 MG/2ML IJ SOLN: 4.0000 mg | Freq: Four times a day (QID) | INTRAMUSCULAR | Status: DC | PRN

## 2012-12-23 MED ORDER — HEPARIN SODIUM (PORCINE) 5000 UNIT/ML IJ SOLN
5000.0000 [IU] | Freq: Once | INTRAMUSCULAR | Status: AC
  Administered 2012-12-23: 5000 [IU] via SUBCUTANEOUS
  Filled 2012-12-23: qty 1

## 2012-12-23 MED ORDER — CEFAZOLIN SODIUM 10 G IJ SOLR
3.0000 g | INTRAMUSCULAR | Status: AC
  Administered 2012-12-23: 3 g via INTRAVENOUS
  Filled 2012-12-23: qty 3000

## 2012-12-23 MED ORDER — SUCCINYLCHOLINE CHLORIDE 20 MG/ML IJ SOLN
INTRAMUSCULAR | Status: DC | PRN
  Administered 2012-12-23: 120 mg via INTRAVENOUS

## 2012-12-23 MED ORDER — LISINOPRIL 40 MG PO TABS
40.0000 mg | ORAL_TABLET | Freq: Every day | ORAL | Status: DC
  Administered 2012-12-23 – 2012-12-25 (×3): 40 mg via ORAL
  Filled 2012-12-23 (×3): qty 1

## 2012-12-23 MED ORDER — ROCURONIUM BROMIDE 100 MG/10ML IV SOLN
INTRAVENOUS | Status: DC | PRN
  Administered 2012-12-23: 20 mg via INTRAVENOUS
  Administered 2012-12-23: 5 mg via INTRAVENOUS

## 2012-12-23 MED ORDER — FENTANYL CITRATE 0.05 MG/ML IJ SOLN: 25.0000 ug | INTRAMUSCULAR | Status: DC | PRN

## 2012-12-23 MED ORDER — PHENYLEPHRINE HCL 10 MG/ML IJ SOLN
INTRAMUSCULAR | Status: DC | PRN
  Administered 2012-12-23: 80 ug via INTRAVENOUS

## 2012-12-23 MED ORDER — PROPOFOL INFUSION 10 MG/ML OPTIME
INTRAVENOUS | Status: DC | PRN
  Administered 2012-12-23: 140 ug/kg/min via INTRAVENOUS

## 2012-12-23 MED ORDER — BUPIVACAINE-EPINEPHRINE PF 0.25-1:200000 % IJ SOLN
INTRAMUSCULAR | Status: AC
  Filled 2012-12-23: qty 30

## 2012-12-23 MED ORDER — MEPERIDINE HCL 50 MG/ML IJ SOLN: 6.2500 mg | INTRAMUSCULAR | Status: DC | PRN

## 2012-12-23 MED ORDER — MORPHINE SULFATE 2 MG/ML IJ SOLN
INTRAMUSCULAR | Status: AC
  Filled 2012-12-23: qty 1

## 2012-12-23 MED ORDER — ONDANSETRON HCL 4 MG/2ML IJ SOLN
INTRAMUSCULAR | Status: DC | PRN
  Administered 2012-12-23: 4 mg via INTRAVENOUS

## 2012-12-23 MED ORDER — MIDAZOLAM HCL 5 MG/5ML IJ SOLN
INTRAMUSCULAR | Status: DC | PRN
  Administered 2012-12-23: 2 mg via INTRAVENOUS

## 2012-12-23 MED ORDER — PROPOFOL 10 MG/ML IV BOLUS
INTRAVENOUS | Status: DC | PRN
  Administered 2012-12-23: 200 mg via INTRAVENOUS
  Administered 2012-12-23: 100 mg via INTRAVENOUS

## 2012-12-23 MED ORDER — METOPROLOL SUCCINATE ER 100 MG PO TB24
100.0000 mg | ORAL_TABLET | Freq: Every day | ORAL | Status: DC
Start: 2012-12-24 — End: 2012-12-25
  Administered 2012-12-24 – 2012-12-25 (×2): 100 mg via ORAL
  Filled 2012-12-23 (×2): qty 1

## 2012-12-23 MED ORDER — ENOXAPARIN SODIUM 40 MG/0.4ML ~~LOC~~ SOLN
40.0000 mg | SUBCUTANEOUS | Status: DC
  Filled 2012-12-23: qty 0.4

## 2012-12-23 MED ORDER — FENTANYL CITRATE 0.05 MG/ML IJ SOLN
INTRAMUSCULAR | Status: DC | PRN
  Administered 2012-12-23 (×5): 50 ug via INTRAVENOUS
  Administered 2012-12-23: 100 ug via INTRAVENOUS

## 2012-12-23 MED ORDER — LACTATED RINGERS IV SOLN
INTRAVENOUS | Status: DC
  Administered 2012-12-23: 1000 mL via INTRAVENOUS

## 2012-12-23 MED ORDER — HYDROMORPHONE HCL PF 1 MG/ML IJ SOLN
INTRAMUSCULAR | Status: DC | PRN
  Administered 2012-12-23: 1 mg via INTRAVENOUS

## 2012-12-23 SURGICAL SUPPLY — 30 items
BENZOIN TINCTURE PRP APPL 2/3 (GAUZE/BANDAGES/DRESSINGS) IMPLANT
BLADE HEX COATED 2.75 (ELECTRODE) ×2 IMPLANT
BLADE SURG 15 STRL LF DISP TIS (BLADE) ×1 IMPLANT
BLADE SURG 15 STRL SS (BLADE) ×1
BLADE SURG SZ10 CARB STEEL (BLADE) ×6 IMPLANT
CANISTER SUCTION 2500CC (MISCELLANEOUS) ×2 IMPLANT
CLOTH BEACON ORANGE TIMEOUT ST (SAFETY) IMPLANT
DECANTER SPIKE VIAL GLASS SM (MISCELLANEOUS) IMPLANT
DRAIN PENROSE 18X1/2 LTX STRL (DRAIN) IMPLANT
DRAPE LAPAROTOMY TRNSV 102X78 (DRAPE) ×2 IMPLANT
ELECT REM PT RETURN 9FT ADLT (ELECTROSURGICAL) ×2
ELECTRODE REM PT RTRN 9FT ADLT (ELECTROSURGICAL) ×1 IMPLANT
GLOVE BIOGEL PI IND STRL 7.0 (GLOVE) ×1 IMPLANT
GLOVE BIOGEL PI INDICATOR 7.0 (GLOVE) ×1
GLOVE SURG SIGNA 7.5 PF LTX (GLOVE) ×12 IMPLANT
GOWN PREVENTION PLUS LG XLONG (DISPOSABLE) IMPLANT
GOWN STRL REIN XL XLG (GOWN DISPOSABLE) ×8 IMPLANT
KIT BASIN OR (CUSTOM PROCEDURE TRAY) ×2 IMPLANT
NEEDLE HYPO 25X1 1.5 SAFETY (NEEDLE) IMPLANT
NS IRRIG 1000ML POUR BTL (IV SOLUTION) IMPLANT
PACK BASIC VI WITH GOWN DISP (CUSTOM PROCEDURE TRAY) ×2 IMPLANT
PENCIL BUTTON HOLSTER BLD 10FT (ELECTRODE) ×2 IMPLANT
SPONGE GAUZE 4X4 12PLY (GAUZE/BANDAGES/DRESSINGS) ×2 IMPLANT
SPONGE LAP 18X18 X RAY DECT (DISPOSABLE) ×6 IMPLANT
SPONGE LAP 4X18 X RAY DECT (DISPOSABLE) IMPLANT
STRIP CLOSURE SKIN 1/2X4 (GAUZE/BANDAGES/DRESSINGS) IMPLANT
SYR BULB IRRIGATION 50ML (SYRINGE) ×2 IMPLANT
SYR CONTROL 10ML LL (SYRINGE) ×2 IMPLANT
TOWEL OR 17X26 10 PK STRL BLUE (TOWEL DISPOSABLE) ×2 IMPLANT
YANKAUER SUCT BULB TIP 10FT TU (MISCELLANEOUS) ×2 IMPLANT

## 2012-12-23 NOTE — Transfer of Care (Signed)
Immediate Anesthesia Transfer of Care Note  Patient: Kristy Harris  Procedure(s) Performed: Procedure(s): incision  AND DEBRIDEMENT right buttock infection  (Right)  Patient Location: PACU  Anesthesia Type:General  Level of Consciousness: awake, alert , oriented and patient cooperative  Airway & Oxygen Therapy: Patient Spontanous Breathing and Patient connected to face mask oxygen  Post-op Assessment: Report given to PACU RN, Post -op Vital signs reviewed and stable and Patient moving all extremities X 4  Post vital signs: stable  Complications: No apparent anesthesia complications

## 2012-12-23 NOTE — Interval H&P Note (Signed)
History and Physical Interval Note:  12/23/2012 12:08 PM  Kristy Harris  has presented today for surgery, with the diagnosis of right buttock infection   The various methods of treatment have been discussed with the patient and family. Her sister and children are here today with the patient.  After consideration of risks, benefits and other options for treatment, the patient has consented to  Procedure(s): IRRIGATION AND DEBRIDEMENT right buttock infection  (Right) as a surgical intervention .    The patient's history has been reviewed, patient examined, no change in status, stable for surgery.  I have reviewed the patient's chart and labs.  Questions were answered to the patient's satisfaction.     Bianco Cange H

## 2012-12-23 NOTE — Anesthesia Preprocedure Evaluation (Addendum)
Anesthesia Evaluation  Patient identified by MRN, date of birth, ID band Patient awake    Reviewed: Allergy & Precautions, H&P , NPO status , Patient's Chart, lab work & pertinent test results  Airway Mallampati: II TM Distance: >3 FB Neck ROM: Full    Dental no notable dental hx.    Pulmonary sleep apnea , COPDformer smoker,  breath sounds clear to auscultation  Pulmonary exam normal       Cardiovascular hypertension, Pt. on medications Rhythm:Regular Rate:Normal     Neuro/Psych negative neurological ROS  negative psych ROS   GI/Hepatic negative GI ROS, Neg liver ROS,   Endo/Other  negative endocrine ROSdiabetesMorbid obesity  Renal/GU negative Renal ROS  negative genitourinary   Musculoskeletal negative musculoskeletal ROS (+)   Abdominal   Peds negative pediatric ROS (+)  Hematology  (+) Blood dyscrasia, anemia ,   Anesthesia Other Findings   Reproductive/Obstetrics negative OB ROS                          Anesthesia Physical Anesthesia Plan  ASA: III  Anesthesia Plan: General   Post-op Pain Management:    Induction: Intravenous  Airway Management Planned: Oral ETT  Additional Equipment:   Intra-op Plan:   Post-operative Plan: Extubation in OR  Informed Consent: I have reviewed the patients History and Physical, chart, labs and discussed the procedure including the risks, benefits and alternatives for the proposed anesthesia with the patient or authorized representative who has indicated his/her understanding and acceptance.   Dental advisory given  Plan Discussed with: CRNA  Anesthesia Plan Comments:         Anesthesia Quick Evaluation

## 2012-12-23 NOTE — Op Note (Signed)
12/23/2012  1:29 PM  PATIENT:  Kristy Harris, 67 y.o., female, MRN: [id number]  PREOP DIAGNOSIS:  right buttock infection, Hidradinitis of right buttocks  POSTOP DIAGNOSIS:   Hidradinitis of right buttocks (multiple subcutaneous draining sinuses) - over 18 x 8 cm.  PROCEDURE:   Procedure(s): incision  AND DEBRIDEMENT right buttock infection (18 x 8 cm) [Picture at the end of the dictation]  SURGEON:   [name], M.D.  ASSISTANT:   none  ANESTHESIA:   general  Anesthesiologist: Phillips Grout, MD CRNA: Illene Silver, CRNA; Young Berry Flynn-Cook  General  EBL:  200  ml  LOCAL MEDICATIONS USED:   none  SPECIMEN:   Abscess/chronic sinus cavity  COUNTS CORRECT:  YES  INDICATIONS FOR PROCEDURE:  Kristy Harris is a 67 y.o. (DOB: June 11, 1945) AA  female whose primary care physician is Josue Hector, MD and comes for I&D of right buttocks infection.   The indications and risks of the surgery were explained to the patient.  The risks include, but are not limited to, infection, bleeding, and nerve injury.  There is also a chance that his chronic draining sinus will recur.  I have spent a long time talking to [name] about weight loss and control of her diabetes.  She has started her weight loss and she knows that she needs to continue.  Procedure:   The patient was taken to OR #6 at [facility] OR.  She underwent a general anesthetic and was rolled on a bean bag in the left lateral decubitus position.   A time out was held and the surgical checklist run.   Her right buttocks was painted with betadine.  I tried to inject methylene blue into the sinus tracks.  This worked well for part of the sinus, but not for all of it.   I excised chronically inflamed skin tissue, sinus tracks, and thickened reactive subcutaneous tissues.  The final excision was 8 x 18 cm.  The rectum/anus does not appear involved.  Though the incision comes close to the anus/rectum.   I irrigated the wound  with saline and packed it with saline curlex..  The wound was sterilely dressed.  I will keep the patient overnight to start dressing changes and go from there.             (Above:  Right buttocks wound debridement)   The patient tolerated the procedure well.  Sponge and needle count were correct at the end of the case.   She was transferred to the RR in good condition.  [name], MD, [facility] Pager: [phone] Office phone:  [phone]

## 2012-12-23 NOTE — Anesthesia Postprocedure Evaluation (Signed)
  Anesthesia Post-op Note  Patient: Kristy Harris  Procedure(s) Performed: Procedure(s) (LRB): incision  AND DEBRIDEMENT right buttock infection  (Right)  Patient Location: PACU  Anesthesia Type: General  Level of Consciousness: awake and alert   Airway and Oxygen Therapy: Patient Spontanous Breathing  Post-op Pain: mild  Post-op Assessment: Post-op Vital signs reviewed, Patient's Cardiovascular Status Stable, Respiratory Function Stable, Patent Airway and No signs of Nausea or vomiting  Last Vitals:  Filed Vitals:   12/23/12 1645  BP: 142/81  Pulse: 78  Temp: 36.5 C  Resp: 18    Post-op Vital Signs: stable   Complications: No apparent anesthesia complications

## 2012-12-23 NOTE — Progress Notes (Signed)
Pt up to br w/ assistance.  Soiled dsg to right buttock noted.  Removed old soiled dsg from buttock.  Large, immeasurable amt of active bleeding noted coming from surgical site.  Dr. Michaell Cowing notified.  Instructions given to apply ice bag over new dsg, d/c anticoagulent, and to instruct pt to sit on ice bag and to remain on bedrest for 30 minutes.  Orders carried out.

## 2012-12-23 NOTE — H&P (View-Only) (Signed)
[facility]  Tomothy Eddins, MD,  FACS [address]  [address] [place], [place]    [address] Phone:  [phone] FAX:  [phone]   Re:   Jeana L Brownlow DOB:   01/25/1946 MRN:   [id number]  ASSESSMENT AND PLAN: 1.  Abscess, both buttocks.  Hidradinitis.  Both sides have evidence of chronic draining sinus tracts and inflammation.  The right is worse than the left. [photos at the end of the note from 11/04/2012]  Plan: 1)  To schedule excision of her right buttocks chronic inflammation.  I discussed the indications and potential complications of the surgery.  The risks include, but are not limited to, bleeding, recurrent infection, reformation of the sinuses, and a non healing wound.  There is no easy answer for what she has.  I will leave her wound open, so she will need daily dressing changes.  Because of the location, I don't think that skin graft will be an option to cover the area and the wound will have to heal by secondary intention.  She already has a home health care coming out to her house to check her blood sugars.  I told her the buttocks wound could take 2 to 6 months to heal.  I would not operate on the left side until the right side is healed.  2.  She was on chronic Bactrim, but is now off of this 3.  Morbid obesity - BMI 46.8  She has lost 20 pounds since 06/18/2011  She is continuing to loose weight, which I am proud of her for. 4.  Diabetes mellitus  Not on insulin. 5.  Using a cane to walk.  Limited mobility. 6.  Aortic stenosis, mild  Cardiac cath - 02/21/2011  Dr. Hochrein has seen the patient, but the last visit that I see in the computer is 06/24/2011.  "I think that she can go on to surgery without further cardiac workup other than echo to follow AS."  Dalton McLean.  11/14/2012 7.  Chronic lower extremity edema. 8.  Had trouble with right arm after right radial art cath  It appears that this has resolved.  HISTORY OF PRESENT  ILLNESS: Chief Complaint  Patient presents with  . Routine Post Op    buttock abscess   Shaquanta L Lebo is a 67 y.o. (DOB: 02/23/1946)  AA female who is a patient of NYLAND,LEONARD ROBERT, MD and comes to me today for follow up of an evaluation of bilateral buttocks abscess.   I reviewed the MRI from 11/09/2012 with her.  There is no obvious deep track to this infection and it appears that much of this is at the skin level.  We talked about the options.  I think she is best served with continued weight loss before surgery.  But the right buttocks particularly is hurting her and she wants to go ahead with surgery on the right.  I stressed the importance of her continued weight loss.  Her mobility is limited because of her legs.  The weight loss will help, but will not resolve her physical problems.  She has a someone who comes from home health daily.  We would depend on them to help with her post op dressing changes.   We also talked about the limits of surgery - and that surgery may not cure this problem.  History of buttock infection: I last saw Ms. Oehler last April 2013, when I did an I&D of her right buttocks on   06/13/2011 in the urgent office.   She has seen Dr. Lupton over the last year.  He has treated her with Accutane x 6 months, until March 2014.  The he switched he to another medicine, which she does not remember the name of.  She said that she got "shots" in May and June - but she is unsure of what she was getting.  She is supposed to see Dr. Lupton back on 11/19/2012. She has wounds of both buttocks which are painful and draining. She has lost some more weight since I saw her last year. She does have HHC come by her home every two months to check her diabetes.  Current Outpatient Prescriptions  Medication Sig Dispense Refill  . aspirin 81 MG tablet Take 81 mg by mouth daily.        . atorvastatin (LIPITOR) 20 MG tablet Take 20 mg by mouth daily.       . ergocalciferol (VITAMIN D2)  50000 UNITS capsule Take 50,000 Units by mouth once a week.       . fluconazole (DIFLUCAN) 150 MG tablet       . glipiZIDE (GLUCOTROL XL) 10 MG 24 hr tablet Take 10 mg by mouth daily.       . HYDROcodone-acetaminophen (NORCO/VICODIN) 5-325 MG per tablet       . lisinopril (PRINIVIL,ZESTRIL) 40 MG tablet Take 1 tablet (40 mg total) by mouth daily.  90 tablet  3  . metFORMIN (GLUCOPHAGE-XR) 500 MG 24 hr tablet Take 500 mg by mouth 2 (two) times daily.       . metoprolol (TOPROL-XL) 100 MG 24 hr tablet Take 100 mg by mouth daily.        . mupirocin ointment (BACTROBAN) 2 %       . verapamil (CALAN) 120 MG tablet Take 120 mg by mouth 3 (three) times daily.        . cilostazol (PLETAL) 100 MG tablet Take 100 mg by mouth 2 (two) times daily.        . DIOVAN 160 MG tablet        No current facility-administered medications for this visit.   Review of Systems: Cardiology - 1. Aortic stenosis: Mild AS on 12/12 echo. Murmur does not sound severe. Given plan for surgery, will get echo to make sure that AS has not worsened.   2. HTN: She is on a rather complicated regimen with multiple BP meds. She is on both valsartan and lisinopril. I will have her stop valsartan and increase lisinopril to 40 mg daily.   3. Exertional dyspnea: She is not very active. She is not volume overloaded on exam. No exertional chest pain. Normal cath in 12/12. Sinus tachycardia with activity. I think that she probably is deconditioned. I encouraged her to walk more.   4. Patient is using cilostazol. She has no history that I can find of PAD. She denies claudication now or in the past. I will have her ask her PCP if it is really necessary for her to take cilostazol.   5. Pre-operative evaluation: Patient is planned for low risk surgery (drainage of buttocks abscess). I think that she can go on to surgery without further cardiac workup other than echo to follow AS. Dalton McLean   Social History: Unmarried.   Grandson lives with  her at night.  He's in school. Daughter, [name], and granddaughter, Maurqesha, have come with patient in the past  PHYSICAL EXAM: BP 132/76  Pulse 60  Temp(Src)   98 F (36.7 C)  Resp 18  Ht 5' 4" (1.626 m)  Wt 266 lb (120.657 kg)  BMI 45.64 kg/m2  General: Obese AA F who is alert.  She uses a cane to walk  Buttocks:  Left sided 4 to 5 cm inflammation with draining sinus.  Two draining areas on the right\, the upper one about 3 to 4 cm and a larger one inferiorly about 6 to 7 cm that has granulation tissue about 2 x 5 cm. [Photos in chart for 11/04/2012 visit] Extremities:  Uses a cane to walk with 1+ edema of LE.  DATA REVIEWED: Epic notes.  Aylan Bayona, MD, FACS Office:  [phone]  

## 2012-12-24 ENCOUNTER — Encounter (HOSPITAL_COMMUNITY): Payer: Self-pay | Admitting: Surgery

## 2012-12-24 NOTE — Progress Notes (Signed)
General Surgery Note  LOS: 1 day  POD -  1 Day Post-Op  Assessment/Plan: 1  Chronic infection/hidradinitis right buttocks.  Incision  AND DEBRIDEMENT right buttock infection - 12/23/2012 - D. Teesha Ohm  Wound looks okay.  No bleeding this AM.  Will keep one more day.  Will start BID dressing changes.  [facility]HC arranged for wound care on discharge.  She has the prescription of Vicodin for pain.  2. Morbid obesity - BMI 46.8   She has lost 20 pounds since 06/18/2011  3. Diabetes mellitus - non insulin dependent 4. Using a cane to walk.   Limited mobility.  5. Aortic stenosis, mild   Cardiac cath - 02/21/2011   Dr. Antoine Poche sees the patient.  Seen for card clearance by Marca Ancona. 11/14/2012  6. Chronic lower extremity edema. 7.  DVT prophylaxis - on hold because of bleeding last PM  Subjective:  Doing okay.  Had a good night except for bleeding from the wound. Objective:   Filed Vitals:   12/24/12 0531  BP: 121/73  Pulse: 90  Temp: 98.6 F (37 C)  Resp: 20    Intake/Output from previous day:  10/23 0701 - 10/24 0700 In: 2602.5 [P.O.:840; I.V.:1762.5] Out: 3300 [Urine:3200; Blood:100]  Intake/Output this shift:      Physical Exam:   General: Obese AAF who is alert and oriented.    HEENT: Normal. Pupils equal. .   Lungs: Clear   Abdomen: Soft   Wound: Wound is clean.  There was bleeding from the wound last PM that required changing the dressing.  Lovenox is on hold.   Lab Results:   No results found for this basename: WBC, HGB, HCT, PLT,  in the last 72 hours  BMET  No results found for this basename: NA, K, CL, CO2, GLUCOSE, BUN, CREATININE, CALCIUM,  in the last 72 hours  PT/INR  No results found for this basename: LABPROT, INR,  in the last 72 hours  ABG  No results found for this basename: PHART, PCO2, PO2, HCO3,  in the last 72 hours   Studies/Results:  No results found.   Anti-infectives:   Anti-infectives   Start     Dose/Rate Route Frequency Ordered Stop   12/23/12 1230  ceFAZolin (ANCEF) 3 g in dextrose 5 % 50 mL IVPB     3 g 160 mL/hr over 30 Minutes Intravenous On call to O.R. 12/23/12 0831 12/23/12 1227      [name], MD, FACS Pager: [phone] [facility] Office: [phone] 12/24/2012

## 2012-12-24 NOTE — Care Management Note (Signed)
    Page 1 of 2   12/24/2012     12:01:42 PM   CARE MANAGEMENT NOTE 12/24/2012  Patient:  Kristy Harris, Kristy Harris   Account Number:  [id number]  Date Initiated:  12/24/2012  Documentation initiated by:  Lorenda Ishihara  Subjective/Objective Assessment:   67 yo female admitted s/p I&D of buttock. PTA lived at home, has aide from [facility] assisting.     Action/Plan:   Home when stable   Anticipated DC Date:  12/25/2012   Anticipated DC Plan:  HOME W HOME HEALTH SERVICES      DC Planning Services  CM consult      [facility] Choice  HOME HEALTH  Resumption Of Svcs/PTA Provider   Choice offered to / List presented to:  C-1 Patient        HH arranged  HH-1 RN  HH-4 NURSE'S AIDE      HH agency  [facility]  OTHER - SEE NOTE   Status of service:  Completed, signed off Medicare Important Message given?   (If response is "NO", the following Medicare IM given date fields will be blank) Date Medicare IM given:   Date Additional Medicare IM given:    Discharge Disposition:  HOME W HOME HEALTH SERVICES  Per UR Regulation:  Reviewed for med. necessity/level of care/duration of stay  If discussed at Long Length of Stay Meetings, dates discussed:    Comments:  12-24-12 Lorenda Ishihara RN CM 1200 Patient active with [facility] for nursing, also has aide from [facility]. [facility] notified of need for wound care.

## 2012-12-25 DIAGNOSIS — Z6841 Body Mass Index (BMI) 40.0 and over, adult: Secondary | ICD-10-CM | POA: Diagnosis not present

## 2012-12-25 DIAGNOSIS — I359 Nonrheumatic aortic valve disorder, unspecified: Secondary | ICD-10-CM | POA: Diagnosis not present

## 2012-12-25 DIAGNOSIS — R609 Edema, unspecified: Secondary | ICD-10-CM | POA: Diagnosis not present

## 2012-12-25 DIAGNOSIS — Z79899 Other long term (current) drug therapy: Secondary | ICD-10-CM | POA: Diagnosis not present

## 2012-12-25 DIAGNOSIS — L0231 Cutaneous abscess of buttock: Secondary | ICD-10-CM | POA: Diagnosis present

## 2012-12-25 DIAGNOSIS — E119 Type 2 diabetes mellitus without complications: Secondary | ICD-10-CM | POA: Diagnosis not present

## 2012-12-25 DIAGNOSIS — L732 Hidradenitis suppurativa: Secondary | ICD-10-CM | POA: Diagnosis not present

## 2012-12-25 MED ORDER — INFLUENZA VAC SPLIT QUAD 0.5 ML IM SUSP
0.5000 mL | INTRAMUSCULAR | Status: AC
  Administered 2012-12-25: 0.5 mL via INTRAMUSCULAR
  Filled 2012-12-25: qty 0.5

## 2012-12-25 NOTE — Progress Notes (Signed)
2 Days Post-Op  Subjective: She is comfortable.  Dressing changed this morning.  Objective: Vital signs in last 24 hours: Temp:  [98 F (36.7 C)-99 F (37.2 C)] 98 F (36.7 C) (10/25 0622) Pulse Rate:  [81-89] 82 (10/25 0622) Resp:  [16-20] 18 (10/25 0622) BP: (127-155)/(65-72) 127/68 mmHg (10/25 0622) SpO2:  [90 %-95 %] 90 % (10/25 0622) Last BM Date: 12/23/12  Intake/Output from previous day: 10/24 0701 - 10/25 0700 In: 120 [P.O.:120] Out: 2000 [Urine:2000] Intake/Output this shift:    PE: General- In NAD Buttock dressing dry  Lab Results:  No results found for this basename: WBC, HGB, HCT, PLT,  in the last 72 hours BMET No results found for this basename: NA, K, CL, CO2, GLUCOSE, BUN, CREATININE, CALCIUM,  in the last 72 hours PT/INR No results found for this basename: LABPROT, INR,  in the last 72 hours Comprehensive Metabolic Panel:    Component Value Date/Time   NA 135 12/15/2012 1420   K 4.3 12/15/2012 1420   CL 101 12/15/2012 1420   CO2 28 12/15/2012 1420   BUN 11 12/15/2012 1420   CREATININE 0.63 12/15/2012 1420   GLUCOSE 181* 12/15/2012 1420   CALCIUM 9.5 12/15/2012 1420   AST 11 12/15/2012 1420   ALT 8 12/15/2012 1420   ALKPHOS 99 12/15/2012 1420   BILITOT 0.2* 12/15/2012 1420   PROT 8.1 12/15/2012 1420   ALBUMIN 3.0* 12/15/2012 1420     Studies/Results: No results found.  Anti-infectives: Anti-infectives   Start     Dose/Rate Route Frequency Ordered Stop   12/23/12 1230  ceFAZolin (ANCEF) 3 g in dextrose 5 % 50 mL IVPB     3 g 160 mL/hr over 30 Minutes Intravenous On call to O.R. 12/23/12 0831 12/23/12 1227      Assessment Stable following debridement of right buttock hidradenitis    LOS: 2 days   Plan: Discharge.  Home health care has been arranged.   Ashira Kirsten J 12/25/2012

## 2012-12-29 ENCOUNTER — Ambulatory Visit (INDEPENDENT_AMBULATORY_CARE_PROVIDER_SITE_OTHER): Payer: Medicare Other | Admitting: Surgery

## 2012-12-29 ENCOUNTER — Encounter (INDEPENDENT_AMBULATORY_CARE_PROVIDER_SITE_OTHER): Payer: Self-pay | Admitting: Surgery

## 2012-12-29 ENCOUNTER — Telehealth (INDEPENDENT_AMBULATORY_CARE_PROVIDER_SITE_OTHER): Payer: Self-pay

## 2012-12-29 VITALS — BP 126/70 | HR 100 | Temp 97.8°F | Resp 16

## 2012-12-29 DIAGNOSIS — L0231 Cutaneous abscess of buttock: Secondary | ICD-10-CM

## 2012-12-29 NOTE — Discharge Summary (Signed)
Physician Discharge Summary  Patient ID: Kristy Harris MRN: [id number] DOB/AGE: January 22, 1946 67 y.o.  Admit date: 12/23/2012 Discharge date: 12/25/2012  Admission Diagnoses:  Chronic infected hidradenitis right buttocks  Discharge Diagnoses:  Chronic infected hidradenitis right buttocks Morbid obesity Non-insulin-dependent diabetes mellitus   Discharged Condition: good  Hospital Course: she was taken to the operating room by Dr. [name] 12/23/2012 for incision and debridement of the right buttock infected hydradenitis was performed. She was started on dressing changes her first postoperative day and she tolerated these well. Home health nursing was arranged. She was able to discharged on her second postoperative day.  Consults: None  Significant Diagnostic Studies: none  Treatments: surgery: incision and debridement infected right buttock hidradenitis  Discharge Exam: Blood pressure 127/68, pulse 82, temperature 98 F (36.7 C), temperature source Oral, resp. rate 18, height 5\' 5"  (1.651 m), weight 260 lb (117.935 kg), SpO2 90.00%.   Disposition: 06-Home-Health Care Svc   Future Appointments Provider Department Dept Phone   12/29/2012 10:30 AM Kandis Cocking, MD [facility], [place] [phone]       Medication List         acetaminophen 325 MG tablet  Commonly known as:  TYLENOL  Take 325-650 mg by mouth every 6 (six) hours as needed for pain.     albuterol 108 (90 BASE) MCG/ACT inhaler  Commonly known as:  PROVENTIL HFA;VENTOLIN HFA  Inhale 2 puffs into the lungs every 4 (four) hours as needed for shortness of breath.     aspirin 81 MG tablet  Take 81 mg by mouth every morning.     atorvastatin 20 MG tablet  Commonly known as:  LIPITOR  Take 20 mg by mouth every morning.     fluconazole 150 MG tablet  Commonly known as:  DIFLUCAN  Take 150 mg by mouth once as needed (For yeast infection from antibiotics.).     glipiZIDE 10 MG 24 hr tablet   Commonly known as:  GLUCOTROL XL  Take 10 mg by mouth every morning.     HYDROcodone-acetaminophen 5-325 MG per tablet  Commonly known as:  NORCO/VICODIN  Take 1 tablet by mouth every 8 (eight) hours as needed for pain.     lisinopril 40 MG tablet  Commonly known as:  PRINIVIL,ZESTRIL  Take 40 mg by mouth every morning.     metFORMIN 500 MG 24 hr tablet  Commonly known as:  GLUCOPHAGE-XR  Take 500 mg by mouth 2 (two) times daily.     metoprolol succinate 100 MG 24 hr tablet  Commonly known as:  TOPROL-XL  Take 100 mg by mouth every morning.     mupirocin ointment 2 %  Commonly known as:  BACTROBAN  Apply 1 application topically daily as needed (Applies buttocks.).     verapamil 120 MG tablet  Commonly known as:  CALAN  Take 120 mg by mouth daily.     Vitamin D-3 5000 UNITS Tabs  Take 5,000 Units by mouth every morning.         Signed: Adolph Pollack 12/29/2012, 7:36 AM

## 2012-12-29 NOTE — Progress Notes (Signed)
[facility]  [name], MD,  FACS [address] [place]  [address] [place], [place]    [address] Phone:  [phone] FAX:  [phone]   Re:   Kristy Harris DOB:   Apr 12, 1945 MRN:   [id number]  ASSESSMENT AND PLAN: 1.  Abscess, both buttocks.  Hidradinitis.  Excision of right buttocks infection - 12/23/2012  Doing well early.  Daughter is changing wound.  For some reason HHC is only coming out once per week.  Will try to increase visits.  Will write note for hospital bed.  Reason for hospital bed is to assist in changing the dressing from the [facility] experience and the patient's limited activity.  She has limited physical activity, so she would benefit from a trapeze bar.  I'll see back in 2 weeks.  2.  Morbid obesity - BMI 46.8  She has lost 20 pounds since 06/18/2011  I have encouraged her to continue to loose weight. 3.  Diabetes mellitus  Not on insulin. 4.  Using a cane to walk.  Limited mobility. 5.  Aortic stenosis, mild  Cardiac cath - 02/21/2011  Dr. Antoine Harris has seen the patient, Saw Dr.Dalton Shirlee Harris for pre op clearance - 11/2012. 6.  Chronic lower extremity edema.  HISTORY OF PRESENT ILLNESS: Chief Complaint  Patient presents with  . Routine Post Op    1 wk f/u reck butock abscess   Kristy Harris is a 67 y.o. (DOB: [birth year])  AA female who is a patient of Kristy Hector, MD and comes to me follow up of excision of chronic infection of right buttocks. Her daughter is with her.  She is doing well.  It sounds like she is doing all the right things at home.  She is washing the wound BID in a shower.  Her daughter is her primary care giver.  For some reason HHC is only coming out once a week.  We will try to increase this.  History of buttocks infection (11/2012): I last saw Kristy Harris last April 2013, when I did an I&D of her right buttocks on 06/13/2011 in the urgent office.   She has seen Dr. Terri Harris over the last year.  He has treated  her with Accutane x 6 months, until March 2014.  The he switched he to another medicine, which she does not remember the name of.  She said that she got "shots" in May and June - but she is unsure of what she was getting.  She is supposed to see Dr. Terri Harris back on 11/19/2012. She has wounds of both buttocks which are painful and draining. She has lost some more weight since I saw her last year. She does have [facility] come by her home every two months to check her diabetes.  Current Outpatient Prescriptions  Medication Sig Dispense Refill  . acetaminophen (TYLENOL) 325 MG tablet Take 325-650 mg by mouth every 6 (six) hours as needed for pain.      Marland Kitchen albuterol (PROVENTIL HFA;VENTOLIN HFA) 108 (90 BASE) MCG/ACT inhaler Inhale 2 puffs into the lungs every 4 (four) hours as needed for shortness of breath.      Marland Kitchen aspirin 81 MG tablet Take 81 mg by mouth every morning.       Marland Kitchen atorvastatin (LIPITOR) 20 MG tablet Take 20 mg by mouth every morning.       . Cholecalciferol (VITAMIN D-3) 5000 UNITS TABS Take 5,000 Units by mouth every morning.      . fluconazole (  DIFLUCAN) 150 MG tablet Take 150 mg by mouth once as needed (For yeast infection from antibiotics.).       Marland Kitchen glipiZIDE (GLUCOTROL XL) 10 MG 24 hr tablet Take 10 mg by mouth every morning.       Marland Kitchen HYDROcodone-acetaminophen (NORCO/VICODIN) 5-325 MG per tablet Take 1 tablet by mouth every 8 (eight) hours as needed for pain.       Marland Kitchen lisinopril (PRINIVIL,ZESTRIL) 40 MG tablet Take 40 mg by mouth every morning.      . metFORMIN (GLUCOPHAGE-XR) 500 MG 24 hr tablet Take 500 mg by mouth 2 (two) times daily.       . metoprolol (TOPROL-XL) 100 MG 24 hr tablet Take 100 mg by mouth every morning.       . mupirocin ointment (BACTROBAN) 2 % Apply 1 application topically daily as needed (Applies buttocks.).       Marland Kitchen verapamil (CALAN) 120 MG tablet Take 120 mg by mouth daily.        No current facility-administered medications for this visit.   Review of Systems: 1.  Aortic stenosis: Mild AS on 12/12 echo. 2. HTN: She is on a rather complicated regimen with multiple BP meds. She is on both valsartan and lisinopril. I will have her stop valsartan and increase lisinopril to 40 mg daily.  3. Exertional dyspnea: She is not very active. She is not volume overloaded on exam. No exertional chest pain. Normal cath in 12/12.   Social History: Unmarried.   Grandson lives with her at night.  He's in school. Daughter, Kristy Harris, and granddaughter, Kristy Harris, are with patient.  PHYSICAL EXAM: BP 126/70  Pulse 100  Temp(Src) 97.8 F (36.6 C) (Temporal)  Resp 16  General: Obese AA F who is alert.  She uses a cane to walk  Buttocks:  Open wound of right buttocks looks good.  I cleaned it and repacked it.    Right buttocks wound (12/29/2012)  DATA REVIEWED: Epic notes.  [name], MD, FACS Office:  [phone]

## 2012-12-29 NOTE — Telephone Encounter (Signed)
Spoke with [facility] @ [facility] @[phone]  Asking for additional days per week for Wound care/dressing change and Hospital bed with trapeze bar. Rexford Maus states they will need to educate a family member/friend to do dressing changes per her insurance guidelines , [facility] will do wound care 3 times a week for about 2-3 weeks then will need to change to one time a week until healed . [facility] will also need  MD dictation stating the dx for hospital bed with trapeze bar and Written RX and fax to [facility] @ [phone]

## 2013-01-12 ENCOUNTER — Ambulatory Visit (INDEPENDENT_AMBULATORY_CARE_PROVIDER_SITE_OTHER): Payer: Medicare Other | Admitting: Surgery

## 2013-01-12 DIAGNOSIS — L0231 Cutaneous abscess of buttock: Secondary | ICD-10-CM

## 2013-01-12 NOTE — Progress Notes (Signed)
[facility]  [name], MD,  FACS [address] [place]  [address] [place], [place]    [address] Phone:  [phone] FAX:  [phone]   Re:   Kristy Harris DOB:   [birth year] MRN:   [id number]  ASSESSMENT AND PLAN: 1.  Abscess, both buttocks.  Hidradinitis.  Excision of right buttocks infection - 12/23/2012  She and [facility] are doing a very good job of local wound care.  Will get [facility] to come for one to 2 more weeks - then [facility] can be discontinued.  I'll see back in 3 weeks.  She needs to be placed in room #11.  2.  Morbid obesity - BMI 46.8  She has lost 20 pounds since 06/18/2011  I have encouraged her to continue to loose weight.  Her weight is 266 today. 3.  Diabetes mellitus  Not on insulin. 4.  Using a cane to walk.  Limited mobility. 5.  Aortic stenosis, mild  Cardiac cath - 02/21/2011  Dr. Antoine Poche has seen the patient, Saw Dr.Dalton Shirlee Latch for pre op clearance - 11/2012. 6.  Chronic lower extremity edema.  HISTORY OF PRESENT ILLNESS: No chief complaint on file.  Kristy Harris is a 67 y.o. 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 Her daughter is with her.  She is doing very well taking care of the wound.  And the wound is showing good progress.  History of buttocks infection (11/2012): I last saw Ms. Symons last April 2013, when I did an I&D of her right buttocks on 06/13/2011 in the urgent office.   She has seen Dr. Terri Piedra over the last year.  He has treated her with Accutane x 6 months, until March 2014.  The he switched he to another medicine, which she does not remember the name of.  She said that she got "shots" in May and June - but she is unsure of what she was getting.  She is supposed to see Dr. Terri Piedra back on 11/19/2012. She has wounds of both buttocks which are painful and draining. She has lost some more weight since I saw her  last year. She does have [facility] come by her home every two months to check her diabetes.  Current Outpatient Prescriptions  Medication Sig Dispense Refill  . acetaminophen (TYLENOL) 325 MG tablet Take 325-650 mg by mouth every 6 (six) hours as needed for pain.      Marland Kitchen albuterol (PROVENTIL HFA;VENTOLIN HFA) 108 (90 BASE) MCG/ACT inhaler Inhale 2 puffs into the lungs every 4 (four) hours as needed for shortness of breath.      Marland Kitchen aspirin 81 MG tablet Take 81 mg by mouth every morning.       Marland Kitchen atorvastatin (LIPITOR) 20 MG tablet Take 20 mg by mouth every morning.       . Cholecalciferol (VITAMIN D-3) 5000 UNITS TABS Take 5,000 Units by mouth every morning.      . fluconazole (DIFLUCAN) 150 MG tablet Take 150 mg by mouth once as needed (For yeast infection from antibiotics.).       Marland Kitchen glipiZIDE (GLUCOTROL XL) 10 MG 24 hr tablet Take 10 mg by mouth every morning.       Marland Kitchen HYDROcodone-acetaminophen (NORCO/VICODIN) 5-325 MG per tablet Take 1 tablet by mouth every 8 (eight) hours as needed for pain.       Marland Kitchen lisinopril (PRINIVIL,ZESTRIL)  40 MG tablet Take 40 mg by mouth every morning.      . metFORMIN (GLUCOPHAGE-XR) 500 MG 24 hr tablet Take 500 mg by mouth 2 (two) times daily.       . metoprolol (TOPROL-XL) 100 MG 24 hr tablet Take 100 mg by mouth every morning.       . mupirocin ointment (BACTROBAN) 2 % Apply 1 application topically daily as needed (Applies buttocks.).       Marland Kitchen verapamil (CALAN) 120 MG tablet Take 120 mg by mouth daily.        No current facility-administered medications for this visit.   Review of Systems: 1. Aortic stenosis: Mild AS on 12/12 echo. 2. HTN: She is on a rather complicated regimen with multiple BP meds. She is on both valsartan and lisinopril. I will have her stop valsartan and increase lisinopril to 40 mg daily.  3. Exertional dyspnea: She is not very active. She is not volume overloaded on exam. No exertional chest pain. Normal cath in 12/12.   Social History: Unmarried.    Grandson lives with her at night.  He's in school. Daughter, Kristy Harris, are with patient. She also has a and granddaughter, Kristy Harris.  PHYSICAL EXAM: There were no vitals taken for this visit.  General: Obese AA F who is alert.  She uses a cane to walk  Buttocks:  Open wound of right buttocks looks good.  I cleaned it and repacked it.  I did not photograph the wound today.  DATA REVIEWED: None new.  [name], MD, FACS Office:  [phone]

## 2013-02-02 ENCOUNTER — Encounter (INDEPENDENT_AMBULATORY_CARE_PROVIDER_SITE_OTHER): Payer: Self-pay | Admitting: Surgery

## 2013-02-02 ENCOUNTER — Ambulatory Visit (INDEPENDENT_AMBULATORY_CARE_PROVIDER_SITE_OTHER): Payer: Medicare Other | Admitting: Surgery

## 2013-02-02 VITALS — BP 126/76 | HR 68 | Temp 98.0°F | Resp 18 | Ht 62.0 in | Wt 267.0 lb

## 2013-02-02 DIAGNOSIS — L0231 Cutaneous abscess of buttock: Secondary | ICD-10-CM

## 2013-02-02 NOTE — Progress Notes (Signed)
[facility]  [name], MD,  FACS [address] [place]  [address] [place], [place]    [address] Phone:  [phone] FAX:  [phone]   Re:   Kristy Harris DOB:   [birth year] MRN:   [id number]  ASSESSMENT AND PLAN: 1.  Abscess, both buttocks.  Hidradinitis.  Excision of right buttocks infection - 12/23/2012  Continues good wound healing with contraction of scar and good granulation tissue.  I'll see back in 4 weeks.  She needs to be placed in room #11.  2.  Morbid obesity - BMI 46.8  She has lost 20 pounds since 06/18/2011  I have encouraged her to continue to loose weight.  She's gained a pound over Thanksgiving. 3.  Diabetes mellitus  Not on insulin. 4.  Using a cane to walk.  Limited mobility. 5.  Aortic stenosis, mild  Cardiac cath - 02/21/2011  Dr. Antoine Harris has seen the patient, Saw Dr.Dalton Shirlee Harris for pre op clearance - 11/2012. 6.  Chronic lower extremity edema.  HISTORY OF PRESENT ILLNESS: Chief Complaint  Patient presents with  . Routine Post Op    abscess buttock   Kristy Harris is a 67 y.o. 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 Her daughter is with her.  She is doing very well taking care of the wound.  And the wound is showing good progress.  History of buttocks infection (11/2012): I last saw Kristy Harris last April 2013, when I did an I&D of her right buttocks on 06/13/2011 in the urgent office.   She has seen Dr. Terri Harris over the last year.  He has treated her with Accutane x 6 months, until March 2014.  The he switched he to another medicine, which she does not remember the name of.  She said that she got "shots" in May and June - but she is unsure of what she was getting.  She is supposed to see Dr. Terri Harris back on 11/19/2012. She has wounds of both buttocks which are painful and draining. She has lost some more weight  since I saw her last year. She does have [facility] come by her home every two months to check her diabetes.  Current Outpatient Prescriptions  Medication Sig Dispense Refill  . acetaminophen (TYLENOL) 325 MG tablet Take 325-650 mg by mouth every 6 (six) hours as needed for pain.      Marland Kitchen albuterol (PROVENTIL HFA;VENTOLIN HFA) 108 (90 BASE) MCG/ACT inhaler Inhale 2 puffs into the lungs every 4 (four) hours as needed for shortness of breath.      Marland Kitchen aspirin 81 MG tablet Take 81 mg by mouth every morning.       Marland Kitchen atorvastatin (LIPITOR) 20 MG tablet Take 20 mg by mouth every morning.       . Cholecalciferol (VITAMIN D-3) 5000 UNITS TABS Take 5,000 Units by mouth every morning.      . fluconazole (DIFLUCAN) 150 MG tablet Take 150 mg by mouth once as needed (For yeast infection from antibiotics.).       Marland Kitchen glipiZIDE (GLUCOTROL XL) 10 MG 24 hr tablet Take 10 mg by mouth every morning.       Marland Kitchen HYDROcodone-acetaminophen (NORCO/VICODIN) 5-325 MG per tablet Take 1 tablet by mouth every 8 (eight) hours as needed for pain.       Marland Kitchen lisinopril (PRINIVIL,ZESTRIL) 40 MG tablet Take 40 mg  by mouth every morning.      . metFORMIN (GLUCOPHAGE-XR) 500 MG 24 hr tablet Take 500 mg by mouth 2 (two) times daily.       . metoprolol (TOPROL-XL) 100 MG 24 hr tablet Take 100 mg by mouth every morning.       . mupirocin ointment (BACTROBAN) 2 % Apply 1 application topically daily as needed (Applies buttocks.).       Marland Kitchen verapamil (CALAN) 120 MG tablet Take 120 mg by mouth daily.        No current facility-administered medications for this visit.   Review of Systems: 1. Aortic stenosis: Mild AS on 12/12 echo. 2. HTN: She is on a rather complicated regimen with multiple BP meds. She is on both valsartan and lisinopril. I will have her stop valsartan and increase lisinopril to 40 mg daily.  3. Exertional dyspnea: She is not very active. She is not volume overloaded on exam. No exertional chest pain. Normal cath in 12/12.   Social  History: Unmarried.   Grandson lives with her at night.  He's in school. Daughter, Kristy Harris, are with patient. She also has a and granddaughter, Kristy Harris.  PHYSICAL EXAM: BP 126/76  Pulse 68  Temp(Src) 98 F (36.7 C)  Resp 18  Ht 5\' 2"  (1.575 m)  Wt 267 lb (121.11 kg)  BMI 48.82 kg/m2  General: Obese AA F who is alert.  She uses a cane to walk  Buttocks:  Open wound of right buttocks looks good with good granulation tissue and contraction of wound.    Right buttocks wound (02/02/2013)  DATA REVIEWED: None new.  [name], MD, FACS Office:  [phone]

## 2013-03-09 ENCOUNTER — Ambulatory Visit (INDEPENDENT_AMBULATORY_CARE_PROVIDER_SITE_OTHER): Payer: Medicare Other | Admitting: Surgery

## 2013-03-09 ENCOUNTER — Encounter (INDEPENDENT_AMBULATORY_CARE_PROVIDER_SITE_OTHER): Payer: Self-pay | Admitting: Surgery

## 2013-03-09 VITALS — BP 136/78 | HR 68 | Temp 98.0°F | Resp 18 | Ht 62.0 in | Wt 267.0 lb

## 2013-03-09 DIAGNOSIS — L03317 Cellulitis of buttock: Secondary | ICD-10-CM

## 2013-03-09 DIAGNOSIS — L0231 Cutaneous abscess of buttock: Secondary | ICD-10-CM

## 2013-03-09 NOTE — Progress Notes (Signed)
[place], MD,  [address]  [place], [place]    [place] Phone:  [phone] FAX:  [phone]   Re:   Kristy Harris DOB:   May 26, 1945 MRN:   [id number]  ASSESSMENT AND PLAN: 1.  Abscess, both buttocks.  Hidradinitis.  Excision of right buttocks infection - 12/23/2012  The right buttocks wound has essentially healed.  [photo at end of note]  She'll see me back in 3 months.  Then we will decide whether to do the left side or not.  2.  Morbid obesity - BMI 46.8  She has lost 20 pounds since 06/18/2011  She knows that this a problem that she needs to address. 3.  Diabetes mellitus  Not on insulin. 4.  Using a cane to walk.  Limited mobility. 5.  Aortic stenosis, mild  Cardiac cath - 02/21/2011  Dr. Percival Spanish has seen the patient, Saw Dr.Dalton Aundra Dubin for pre op clearance - 11/2012. 6.  Chronic lower extremity edema.  HISTORY OF PRESENT ILLNESS: Chief Complaint  Patient presents with  . Routine Post Op    Buttocks hidradintis   Kristy Harris is a 68 y.o. 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 Her daughter is with her.  The wound has basically healed.  We'll see her in three months to discuss the left side.  History of buttocks infection (11/2012): I last saw Kristy Harris last April 2013, when I did an I&D of her right buttocks on 06/13/2011 in the urgent office.   She has seen Dr. Allyson Sabal over the last year.  He has treated her with Accutane x 6 months, until March 2014.  The he switched he to another medicine, which she does not remember the name of.  She said that she got "shots" in May and June - but she is unsure of what she was getting.  She is supposed to see Dr. Allyson Sabal back on 11/19/2012. She has wounds of both buttocks which are painful and draining. She has lost some more weight since I saw her last  year. She does have [facility] come by her home every two months to check her diabetes.  Current Outpatient Prescriptions  Medication Sig Dispense Refill  . acetaminophen (TYLENOL) 325 MG tablet Take 325-650 mg by mouth every 6 (six) hours as needed for pain.      Marland Kitchen albuterol (PROVENTIL HFA;VENTOLIN HFA) 108 (90 BASE) MCG/ACT inhaler Inhale 2 puffs into the lungs every 4 (four) hours as needed for shortness of breath.      Marland Kitchen aspirin 81 MG tablet Take 81 mg by mouth every morning.       Marland Kitchen atorvastatin (LIPITOR) 20 MG tablet Take 20 mg by mouth every morning.       . Cholecalciferol (VITAMIN D-3) 5000 UNITS TABS Take 5,000 Units by mouth every morning.      . fluconazole (DIFLUCAN) 150 MG tablet Take 150 mg by mouth once as needed (For yeast infection from antibiotics.).       Marland Kitchen glipiZIDE (GLUCOTROL XL) 10 MG 24 hr tablet Take 10 mg by mouth every morning.       Marland Kitchen HYDROcodone-acetaminophen (NORCO/VICODIN) 5-325 MG per tablet Take 1 tablet by mouth every 8 (eight) hours as needed for pain.       Marland Kitchen lisinopril (PRINIVIL,ZESTRIL) 40 MG tablet Take 40 mg  by mouth every morning.      . metFORMIN (GLUCOPHAGE-XR) 500 MG 24 hr tablet Take 500 mg by mouth 2 (two) times daily.       . metoprolol (TOPROL-XL) 100 MG 24 hr tablet Take 100 mg by mouth every morning.       . mupirocin ointment (BACTROBAN) 2 % Apply 1 application topically daily as needed (Applies buttocks.).       Marland Kitchen verapamil (CALAN) 120 MG tablet Take 120 mg by mouth daily.        No current facility-administered medications for this visit.   Review of Systems: 1. Aortic stenosis: Mild AS on 12/12 echo. 2. HTN: She is on a rather complicated regimen with multiple BP meds. She is on both valsartan and lisinopril. I will have her stop valsartan and increase lisinopril to 40 mg daily.  3. Exertional dyspnea: She is not very active. She is not volume overloaded on exam. No exertional chest pain. Normal cath in 12/12.   Social History: Unmarried.     Grandson lives with her at night.  He's in school. Daughter, Kristy Harris, are with patient. She also has a and granddaughter, Kristy Harris.  PHYSICAL EXAM: BP 136/78  Pulse 68  Temp(Src) 98 F (36.7 C)  Resp 18  Ht 5\' 2"  (1.575 m)  Wt 267 lb (121.11 kg)  BMI 48.82 kg/m2  General: Obese AA F who is alert.  She uses a cane to walk  Buttocks:  Right buttocks wound has complete skin coverage.  She has one little area near the anus that is irritated, but this has done very well.  It actually healed ahead of my schedule.    Right buttocks wound (03/09/2013)  DATA REVIEWED: None new.  Alphonsa Overall, MD, [place] Office:  [phone]

## 2013-05-26 ENCOUNTER — Other Ambulatory Visit (INDEPENDENT_AMBULATORY_CARE_PROVIDER_SITE_OTHER): Payer: Self-pay

## 2013-05-26 ENCOUNTER — Ambulatory Visit (INDEPENDENT_AMBULATORY_CARE_PROVIDER_SITE_OTHER): Payer: Medicare Other | Admitting: Surgery

## 2013-05-26 VITALS — BP 132/74 | HR 68 | Temp 98.0°F | Resp 18 | Ht 64.0 in | Wt 271.0 lb

## 2013-05-26 DIAGNOSIS — L02416 Cutaneous abscess of left lower limb: Secondary | ICD-10-CM

## 2013-05-26 DIAGNOSIS — L0291 Cutaneous abscess, unspecified: Secondary | ICD-10-CM

## 2013-05-26 DIAGNOSIS — L0390 Cellulitis, unspecified: Principal | ICD-10-CM

## 2013-05-26 DIAGNOSIS — L02419 Cutaneous abscess of limb, unspecified: Secondary | ICD-10-CM

## 2013-05-26 DIAGNOSIS — L0231 Cutaneous abscess of buttock: Secondary | ICD-10-CM

## 2013-05-26 DIAGNOSIS — L03317 Cellulitis of buttock: Secondary | ICD-10-CM

## 2013-05-26 DIAGNOSIS — L03119 Cellulitis of unspecified part of limb: Secondary | ICD-10-CM

## 2013-05-26 NOTE — Progress Notes (Signed)
[place], MD,  [address]  [place], [place]    [place] Phone:  [phone] FAX:  [phone]   Re:   ANNIBELLE BRAZIE DOB:   [birth year] MRN:   [id number]  ASSESSMENT AND PLAN: 1.  Abscess, both buttocks.  Hidradinitis.  Excision of right buttocks infection - 12/23/2012  This has done very well.  1A.  New area in the left upper thigh  (photo at the end of the chart)  I&D left thigh area in office - 05/26/2013.  Already on Doxycycline by Dr. Edrick Oh.  Will get [facility] to see patient for wound follow uo.  I will see her back in 2 to 3 weeks for wound check  2.  Morbid obesity - BMI 46.8  She has lost 20 pounds since 06/18/2011  She knows that this a problem that she needs to address.  She was doing well, but has stalled over the last 3 months. 3.  Diabetes mellitus  Not on insulin. 4.  Using a cane to walk.  Limited mobility. 5.  Aortic stenosis, mild  Cardiac cath - 02/21/2011  Dr. Percival Spanish has seen the patient, Saw Dr.Dalton Aundra Dubin for pre op clearance - 11/2012. 6.  Chronic lower extremity edema.  HISTORY OF PRESENT ILLNESS: Chief Complaint  Patient presents with  . Establish Care    New abscess perirectal   KELLEEN STOLZE is a 68 y.o. 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 Her daughter is with her.  Now has new abscess areas in the left upper thighs and in pubic area.  There is a chronically draining infection in the upper inner left thigh.  This needs an K&D. Anda Kraft has also stalled on her weight loss.  History of buttocks infection (11/2012): I last saw Ms. Parcell last April 2013, when I did an I&D of her right buttocks on 06/13/2011 in the urgent office.   She has seen Dr. Allyson Sabal over the last year.  He has treated her with Accutane x 6 months, until March 2014.  The he switched he to another  medicine, which she does not remember the name of.  She said that she got "shots" in May and June - but she is unsure of what she was getting.  She is supposed to see Dr. Allyson Sabal back on 11/19/2012. She has wounds of both buttocks which are painful and draining. She has lost some more weight since I saw her last year. She does have [facility] come by her home every two months to check her diabetes.  Current Outpatient Prescriptions  Medication Sig Dispense Refill  . acetaminophen (TYLENOL) 325 MG tablet Take 325-650 mg by mouth every 6 (six) hours as needed for pain.      Marland Kitchen albuterol (PROVENTIL HFA;VENTOLIN HFA) 108 (90 BASE) MCG/ACT inhaler Inhale 2 puffs into the lungs every 4 (four) hours as needed for shortness of breath.      Marland Kitchen aspirin 81 MG tablet Take 81 mg by mouth every morning.       Marland Kitchen atorvastatin (LIPITOR) 20 MG tablet Take 20 mg by mouth every morning.       . Cholecalciferol (VITAMIN D-3) 5000 UNITS TABS Take 5,000 Units by mouth every morning.      Marland Kitchen doxycycline (VIBRAMYCIN) 100 MG capsule       . fluconazole (DIFLUCAN) 150  MG tablet Take 150 mg by mouth once as needed (For yeast infection from antibiotics.).       Marland Kitchen glipiZIDE (GLUCOTROL XL) 10 MG 24 hr tablet Take 10 mg by mouth every morning.       Marland Kitchen HYDROcodone-acetaminophen (NORCO/VICODIN) 5-325 MG per tablet Take 1 tablet by mouth every 8 (eight) hours as needed for pain.       Marland Kitchen lisinopril (PRINIVIL,ZESTRIL) 40 MG tablet Take 40 mg by mouth every morning.      . metFORMIN (GLUCOPHAGE-XR) 500 MG 24 hr tablet Take 500 mg by mouth 2 (two) times daily.       . metoprolol (TOPROL-XL) 100 MG 24 hr tablet Take 100 mg by mouth every morning.       . mupirocin ointment (BACTROBAN) 2 % Apply 1 application topically daily as needed (Applies buttocks.).       Marland Kitchen verapamil (CALAN) 120 MG tablet Take 120 mg by mouth daily.        No current facility-administered medications for this visit.   Review of Systems: 1. Aortic stenosis: Mild AS on  12/12 echo. 2. HTN: She is on a rather complicated regimen with multiple BP meds. She is on both valsartan and lisinopril. I will have her stop valsartan and increase lisinopril to 40 mg daily.  3. Exertional dyspnea: She is not very active. She is not volume overloaded on exam. No exertional chest pain. Normal cath in 12/12.   Social History: Unmarried.   Grandson lives with her at night.  He's in school. Daughter, Freda Munro, are with patient. She also has a and granddaughter, Tessa Lerner.  PHYSICAL EXAM: BP 132/74  Pulse 68  Temp(Src) 98 F (36.7 C)  Resp 18  Ht 5\' 4"  (1.626 m)  Wt 271 lb (122.925 kg)  BMI 46.49 kg/m2  General: Obese AA F who is alert.  She uses a cane to walk  Buttocks:  Right buttocks wound has complete skin coverage.   Left thigh:  3 x 12 cm subcutaneous abscess.  She also has multiple pubic abscesses.  Procedure:  I did an I&D of the left upper thigh abscess.  I painted the area with betadine, infiltrated 8 cc of 1% xylocaine, and made a linear incision into the abscess.   Left thigh abscess, drained     05/27/2103  DATA REVIEWED: None new.  Alphonsa Overall, MD, [place] Office:  [phone]

## 2013-05-27 ENCOUNTER — Telehealth (INDEPENDENT_AMBULATORY_CARE_PROVIDER_SITE_OTHER): Payer: Self-pay | Admitting: *Deleted

## 2013-05-27 NOTE — Telephone Encounter (Signed)
[facility] will not accept pt due to insurance.  [facility] will not accept pt due to her location.  [facility] will not [facility] pt due to her insurance.  I have faxed a referral to [facility] to see if they can take on patient for her dressing changes.

## 2013-06-09 ENCOUNTER — Telehealth (INDEPENDENT_AMBULATORY_CARE_PROVIDER_SITE_OTHER): Payer: Self-pay | Admitting: General Surgery

## 2013-06-09 ENCOUNTER — Encounter (INDEPENDENT_AMBULATORY_CARE_PROVIDER_SITE_OTHER): Payer: Medicare Other | Admitting: Surgery

## 2013-06-09 NOTE — Telephone Encounter (Signed)
Pt called to ask how much she might need to come to her appt late today, as her son has just passed suddenly last night.  Dr. Pollie Friar nurse will call her to assess her wound and reschedule the appt.

## 2013-07-15 ENCOUNTER — Encounter: Payer: Self-pay | Admitting: Gastroenterology

## 2013-07-22 ENCOUNTER — Telehealth (INDEPENDENT_AMBULATORY_CARE_PROVIDER_SITE_OTHER): Payer: Self-pay

## 2013-07-22 NOTE — Telephone Encounter (Signed)
Pt calling stating that she has a abscess under her left arm that has started draining bloody pus. Pt states that this area has become more red and swollen. Pt denies any fevers or chills. Pt states that this place has been there for awhile and has just started getting worse. Advised pt that if she felt like she needed to go to the ER/Urgent care to have this pt, pt states that she doesn't feel like it is this bad. Advised pt that Dr Lucia Gaskins doesn't have anything until June 11, however we could see her in our urgent office clinic on Tuesday, she states that she will call our office on Tuesday morning to let us know how she was over the weekend.

## 2013-07-26 ENCOUNTER — Ambulatory Visit (INDEPENDENT_AMBULATORY_CARE_PROVIDER_SITE_OTHER): Payer: Medicare Other | Admitting: General Surgery

## 2013-07-26 ENCOUNTER — Encounter (INDEPENDENT_AMBULATORY_CARE_PROVIDER_SITE_OTHER): Payer: Self-pay | Admitting: General Surgery

## 2013-07-26 VITALS — BP 180/98 | HR 86 | Temp 97.6°F | Resp 18 | Ht 63.0 in | Wt 270.4 lb

## 2013-07-26 DIAGNOSIS — L02412 Cutaneous abscess of left axilla: Secondary | ICD-10-CM

## 2013-07-26 DIAGNOSIS — IMO0002 Reserved for concepts with insufficient information to code with codable children: Secondary | ICD-10-CM

## 2013-07-26 MED ORDER — HYDROCODONE-ACETAMINOPHEN 5-325 MG PO TABS: 1.0000 | ORAL_TABLET | Freq: Three times a day (TID) | ORAL | Status: DC | PRN

## 2013-07-26 NOTE — Telephone Encounter (Signed)
Pt calling today to make urgent office appt. Pt states that abscess under her left arm is red, swollen and draining pus. Pt states that the area has been there for about a month.

## 2013-07-26 NOTE — Progress Notes (Signed)
Chief complaint: Abscess and drainage left axilla  History: Patient is a 68 year old female with a long history of hidradenitis and soft tissue infections. She has been followed frequently by Dr. Lucia Gaskins. He recently did some fairly extensive surgery on her right hip due to chronic infection. She now presents to the urgent office with one month of persistent bloody drainage and discomfort from her left axilla. She has been on oral doxycycline for 2 months. No fever or chills. It has been about the same for the last several weeks.  Past Medical History  Diagnosis Date  . Diabetes mellitus   . Morbid obesity   . Recurrent boils     of perineal and buttocks  . Hypertension   . Hyperlipidemia   . Hidradenitis   . COPD (chronic obstructive pulmonary disease)   . Enlarged heart   . Anemia   . Arthritis   . Heart murmur   . Sleep apnea     no cpap used since weight loss  . Urinary frequency    Past Surgical History  Procedure Laterality Date  . Pilonidal cyst excision    . Bladder suspension      x 2  . Abcess drainage    . Cardiac catheterization  02/21/2011    Normal coronary arteries, normal EF  . Tonsillectomy  yrs ago  . Abdominal hysterectomy  25 yrs ago  . Irrigation and debridement abscess Right 12/23/2012    Procedure: incision  AND DEBRIDEMENT right buttock infection ;  Surgeon: Shann Medal, MD;  Location: [facility];  Service: General;  Laterality: Right;   Current Outpatient Prescriptions  Medication Sig Dispense Refill  . acetaminophen (TYLENOL) 325 MG tablet Take 325-650 mg by mouth every 6 (six) hours as needed for pain.      Marland Kitchen albuterol (PROVENTIL HFA;VENTOLIN HFA) 108 (90 BASE) MCG/ACT inhaler Inhale 2 puffs into the lungs every 4 (four) hours as needed for shortness of breath.      Marland Kitchen aspirin 81 MG tablet Take 81 mg by mouth every morning.       Marland Kitchen atorvastatin (LIPITOR) 20 MG tablet Take 20 mg by mouth every morning.       . Cholecalciferol (VITAMIN D-3) 5000 UNITS  TABS Take 5,000 Units by mouth every morning.      Marland Kitchen doxycycline (VIBRAMYCIN) 100 MG capsule       . fluconazole (DIFLUCAN) 150 MG tablet Take 150 mg by mouth once as needed (For yeast infection from antibiotics.).       Marland Kitchen glipiZIDE (GLUCOTROL XL) 10 MG 24 hr tablet Take 10 mg by mouth every morning.       Marland Kitchen HYDROcodone-acetaminophen (NORCO/VICODIN) 5-325 MG per tablet Take 1 tablet by mouth every 8 (eight) hours as needed.  30 tablet  0  . lisinopril (PRINIVIL,ZESTRIL) 40 MG tablet Take 40 mg by mouth every morning.      . metFORMIN (GLUCOPHAGE-XR) 500 MG 24 hr tablet Take 500 mg by mouth 2 (two) times daily.       . metoprolol (TOPROL-XL) 100 MG 24 hr tablet Take 100 mg by mouth every morning.       . mupirocin ointment (BACTROBAN) 2 % Apply 1 application topically daily as needed (Applies buttocks.).       Marland Kitchen verapamil (CALAN) 120 MG tablet Take 120 mg by mouth daily.        No current facility-administered medications for this visit.   Allergies  Allergen Reactions  . Penicillins Rash  All over the body   Exam: BP 180/98  Pulse 86  Temp(Src) 97.6 F (36.4 C) (Temporal)  Resp 18  Ht 5\' 3"  (1.6 m)  Wt 270 lb 6.4 oz (122.653 kg)  BMI 47.91 kg/m2 General: morbidly obese African American female in no distress Skin: Pertinent exam of the left axilla reveals 2 pinpoint areas of drainage of bloody purulent material. Mild tenderness. No erythema. There are multiple old scars. I can feel some fluctuance between these 2 areas.  Assessment and plan: Left axillary abscess secondary to hidradenitis. I recommended incision and drainage in the office today. She was agreeable. Under local anesthesia I made about a 1 cm incision over the fluctuant area and this communicated to actually a very relatively large cavity about 5 cm in diameter inferior to the incision down into the axilla. This was packed with iodoform gauze. Her daughter will remove this in 36 hours. Continue antibiotics and daily  dressing changes. She was like to see Dr. Lucia Gaskins back in followup and this will be arranged

## 2013-07-26 NOTE — Patient Instructions (Signed)
Change outer bandage as needed for drainage. On the first day (2 days) remove packing from the wound and continue daily dry gauze dressing. At that time may wash in the shower or tub.

## 2013-08-11 ENCOUNTER — Encounter (INDEPENDENT_AMBULATORY_CARE_PROVIDER_SITE_OTHER): Payer: Self-pay | Admitting: Surgery

## 2013-08-11 ENCOUNTER — Ambulatory Visit (INDEPENDENT_AMBULATORY_CARE_PROVIDER_SITE_OTHER): Payer: Medicare Other | Admitting: Surgery

## 2013-08-11 VITALS — BP 126/70 | HR 68 | Temp 98.0°F | Resp 18 | Ht 64.0 in | Wt 273.0 lb

## 2013-08-11 DIAGNOSIS — L02419 Cutaneous abscess of limb, unspecified: Secondary | ICD-10-CM

## 2013-08-11 DIAGNOSIS — L03119 Cellulitis of unspecified part of limb: Secondary | ICD-10-CM

## 2013-08-11 DIAGNOSIS — L02412 Cutaneous abscess of left axilla: Secondary | ICD-10-CM | POA: Insufficient documentation

## 2013-08-11 DIAGNOSIS — L02416 Cutaneous abscess of left lower limb: Secondary | ICD-10-CM

## 2013-08-11 DIAGNOSIS — IMO0002 Reserved for concepts with insufficient information to code with codable children: Secondary | ICD-10-CM

## 2013-08-11 NOTE — Progress Notes (Signed)
[place], MD,  [address]  [place], [place]    [place] Phone:  [phone] FAX:  [phone]   Re:   VEEDA VIRGO DOB:   1945-10-07 MRN:   [id number]  ASSESSMENT AND PLAN: 1.  Left axillary abscess  Drained by Dr. Excell Seltzer - 07/26/2013  Wound looks pretty good today.  To continue local wound care, we have decided to make her appt PRN.  If an area flairs up, she will call us.  We again talked about weight loss.  2.  Abscess, both buttocks.  Hidradinitis.  Excision of right buttocks infection - 12/23/2012 - D. Lucia Gaskins  This has done very well.  We talked about doing the left side, but she said that she is doing well enough to not have anything done.  3.  Area in the left upper thigh  [photo - 05/26/2013 visit]  This is where her thighs rub together and will be very hard to get to heal. 4.  Morbid obesity - BMI 46.8  She has lost 20 pounds since 06/18/2011  She knows that this a problem that she needs to address.  We have talked about this is a big cause of her recurrent infections.  She had a peak weight of 331, so she is down in weight, but has stalled around 270. 5.  Diabetes mellitus  Not on insulin. 6.  Using a cane to walk.  Limited mobility. 7.  Aortic stenosis, mild  Cardiac cath - 02/21/2011  Dr. Percival Spanish has seen the patient, Saw Dr.Dalton Aundra Dubin for pre op clearance - 11/2012. 8.  Chronic lower extremity edema.  HISTORY OF PRESENT ILLNESS: Chief Complaint  Patient presents with  . Routine Post Op    axillia/ thy. abscess F/U   NITA WHITMIRE is a 68 y.o. (DOB: 1946/02/26)  AA female who is a patient of [name], MD and comes to me follow up of an abscess in her left axilla that Dr. Excell Seltzer did an I&D last visit. Her daughter is with her.  Her wounds are all doing fairly well.  She still has some tenderness in the left axilla.  He left medial thigh wound looks okay.  I did not look at her  buttocks.  History of buttocks infection (11/2012): I last saw Ms. Fleer last April 2013, when I did an I&D of her right buttocks on 06/13/2011 in the urgent office.   She has seen Dr. Allyson Sabal over the last year.  He has treated her with Accutane x 6 months, until March 2014.  The he switched he to another medicine, which she does not remember the name of.  She said that she got "shots" in May and June - but she is unsure of what she was getting.  She is supposed to see Dr. Allyson Sabal back on 11/19/2012. She has wounds of both buttocks which are painful and draining. She has lost some more weight since I saw her last year. She does have [place] come by her home every two months to check her diabetes.  Current Outpatient Prescriptions  Medication Sig Dispense Refill  . acetaminophen (TYLENOL) 325 MG tablet Take 325-650 mg by mouth every 6 (six) hours as needed for pain.      Marland Kitchen albuterol (PROVENTIL HFA;VENTOLIN HFA) 108 (90 BASE) MCG/ACT inhaler Inhale 2 puffs into the lungs every 4 (four) hours as needed for shortness of breath.      Marland Kitchen aspirin 81 MG  tablet Take 81 mg by mouth every morning.       Marland Kitchen atorvastatin (LIPITOR) 20 MG tablet Take 20 mg by mouth every morning.       . Cholecalciferol (VITAMIN D-3) 5000 UNITS TABS Take 5,000 Units by mouth every morning.      Marland Kitchen doxycycline (VIBRAMYCIN) 100 MG capsule       . fluconazole (DIFLUCAN) 150 MG tablet Take 150 mg by mouth once as needed (For yeast infection from antibiotics.).       Marland Kitchen glipiZIDE (GLUCOTROL XL) 10 MG 24 hr tablet Take 10 mg by mouth every morning.       Marland Kitchen HYDROcodone-acetaminophen (NORCO/VICODIN) 5-325 MG per tablet Take 1 tablet by mouth every 8 (eight) hours as needed.  30 tablet  0  . lisinopril (PRINIVIL,ZESTRIL) 40 MG tablet Take 40 mg by mouth every morning.      . metFORMIN (GLUCOPHAGE-XR) 500 MG 24 hr tablet Take 500 mg by mouth 2 (two) times daily.       . metoprolol (TOPROL-XL) 100 MG 24 hr tablet Take 100 mg by mouth every  morning.       . mupirocin ointment (BACTROBAN) 2 % Apply 1 application topically daily as needed (Applies buttocks.).       Marland Kitchen verapamil (CALAN) 120 MG tablet Take 120 mg by mouth daily.        No current facility-administered medications for this visit.   Review of Systems: 1. Aortic stenosis: Mild AS on 12/12 echo. 2. HTN: She is on a rather complicated regimen with multiple BP meds. She is on both valsartan and lisinopril. I will have her stop valsartan and increase lisinopril to 40 mg daily.  3. Exertional dyspnea: She is not very active. She is not volume overloaded on exam. No exertional chest pain. Normal cath in 12/12.   Social History: Unmarried.   Grandson lives with her at night.   Daughter, Freda Munro, are with patient. She also has a and granddaughter, Tessa Lerner.  PHYSICAL EXAM: BP 126/70  Pulse 68  Temp(Src) 98 F (36.7 C)  Resp 18  Ht 5\' 4"  (1.626 m)  Wt 273 lb (123.832 kg)  BMI 46.84 kg/m2  General: Obese AA F who is alert.  She uses a cane to walk  Left axilla - wound with minimal drainage in posterior axilla Left thigh  Open wound with granulation tissue.  Clean and no drainage.   Incision towards back of left axilla.  DATA REVIEWED: None new.  Alphonsa Overall, MD, [place] Office:  [phone]

## 2013-09-19 ENCOUNTER — Ambulatory Visit (INDEPENDENT_AMBULATORY_CARE_PROVIDER_SITE_OTHER): Payer: Medicare Other | Admitting: Gastroenterology

## 2013-09-19 ENCOUNTER — Encounter: Payer: Self-pay | Admitting: Gastroenterology

## 2013-09-19 VITALS — BP 132/70 | HR 96 | Ht 62.5 in | Wt 268.3 lb

## 2013-09-19 DIAGNOSIS — D649 Anemia, unspecified: Secondary | ICD-10-CM | POA: Diagnosis not present

## 2013-09-19 DIAGNOSIS — D509 Iron deficiency anemia, unspecified: Secondary | ICD-10-CM | POA: Diagnosis not present

## 2013-09-19 MED ORDER — NA SULFATE-K SULFATE-MG SULF 17.5-3.13-1.6 GM/177ML PO SOLN: 1.0000 | Freq: Once | ORAL | Status: DC

## 2013-09-19 NOTE — Progress Notes (Signed)
_                                                                                                                History of Present Illness: 68 year old female with history of diabetes, hidradenitis, COPD and sleep apnea referred for evaluation of anemia.  According to the patient she's been intermittently anemic all her life.  Hemoglobin has run in the 9-10 range with MCV 75 and low iron studies.  She has hidradenitis and claims that she  cyst will first from weekly to every month.  When this  occurs she has bleeding as well.  She apparently tested Hemoccult negative.  She is on no gastric irritants including nonsteroidals.  She has no GI complaints including change of bowel habits, abdominal pain, pyrosis or dysphagia.    Past Medical History  Diagnosis Date  . Diabetes mellitus   . Morbid obesity   . Recurrent boils     of perineal and buttocks  . Hypertension   . Hyperlipidemia   . Hidradenitis   . COPD (chronic obstructive pulmonary disease)   . Enlarged heart   . Anemia   . Arthritis   . Heart murmur   . Sleep apnea     no cpap used since weight loss  . Urinary frequency    Past Surgical History  Procedure Laterality Date  . Pilonidal cyst excision    . Bladder suspension      x 2  . Abcess drainage    . Cardiac catheterization  02/21/2011    Normal coronary arteries, normal EF  . Tonsillectomy  yrs ago  . Abdominal hysterectomy  25 yrs ago  . Irrigation and debridement abscess Right 12/23/2012    Procedure: incision  AND DEBRIDEMENT right buttock infection ;  Surgeon: Shann Medal, MD;  Location: [facility];  Service: General;  Laterality: Right;   family history includes Diabetes in her brother and sister; Heart attack in her brother and mother; Lung cancer in her brother and father; Other in her sister. Current Outpatient Prescriptions  Medication Sig Dispense Refill  . acetaminophen (TYLENOL) 325 MG tablet Take 325-650 mg by mouth every 6 (six)  hours as needed for pain.      Marland Kitchen albuterol (PROVENTIL HFA;VENTOLIN HFA) 108 (90 BASE) MCG/ACT inhaler Inhale 2 puffs into the lungs every 4 (four) hours as needed for shortness of breath.      Marland Kitchen aspirin 81 MG tablet Take 81 mg by mouth every morning.       Marland Kitchen atorvastatin (LIPITOR) 20 MG tablet Take 20 mg by mouth every morning.       . Cholecalciferol (VITAMIN D-3) 5000 UNITS TABS Take 5,000 Units by mouth every morning.      Marland Kitchen doxycycline (VIBRAMYCIN) 100 MG capsule       . fluconazole (DIFLUCAN) 150 MG tablet Take 150 mg by mouth once as needed (For yeast infection from antibiotics.).       Marland Kitchen glipiZIDE (GLUCOTROL XL) 10 MG 24 hr tablet  Take 10 mg by mouth every morning.       Marland Kitchen HYDROcodone-acetaminophen (NORCO/VICODIN) 5-325 MG per tablet Take 1 tablet by mouth every 8 (eight) hours as needed.  30 tablet  0  . lisinopril (PRINIVIL,ZESTRIL) 40 MG tablet Take 40 mg by mouth every morning.      . metFORMIN (GLUCOPHAGE-XR) 500 MG 24 hr tablet Take 500 mg by mouth 2 (two) times daily.       . metoprolol (TOPROL-XL) 100 MG 24 hr tablet Take 100 mg by mouth every morning.       . mupirocin ointment (BACTROBAN) 2 % Apply 1 application topically daily as needed (Applies buttocks.).       Marland Kitchen verapamil (CALAN) 120 MG tablet Take 120 mg by mouth daily.        No current facility-administered medications for this visit.   Allergies as of 09/19/2013 - Review Complete 09/19/2013  Allergen Reaction Noted  . Penicillins Rash 01/17/2011    reports that she quit smoking about 20 years ago. Her smoking use included Cigarettes. She has a 10 pack-year smoking history. She has never used smokeless tobacco. She reports that she does not drink alcohol or use illicit drugs.     Review of Systems: She suffers from arthritis Pertinent positive and negative review of systems were noted in the above HPI section. All other review of systems were otherwise negative.  Vital signs were reviewed in today's medical  record Physical Exam: General: Obese female in no acute distress Skin: anicteric Head: Normocephalic and atraumatic Eyes:  sclerae anicteric, EOMI Ears: Normal auditory acuity Mouth: No deformity or lesions Neck: Supple, no masses or thyromegaly Lungs: Clear throughout to auscultation Heart: Regular rate and rhythm; no  rubs or bruits Abdomen: Soft, non tender and non distended. No masses, hepatosplenomegaly or hernias noted. Normal Bowel sounds.  There is a 2-7/0 early systolic murmur Rectal:deferred Musculoskeletal: Symmetrical with no gross deformities  Skin: No lesions on visible extremities Pulses:  Normal pulses noted Extremities: No clubbing, cyanosis, edema or deformities noted Neurological: Alert oriented x 4, grossly nonfocal Cervical Nodes:  No significant cervical adenopathy Inguinal Nodes: No significant inguinal adenopathy Psychological:  Alert and cooperative. Normal mood and affect  See Assessment and Plan under Problem List

## 2013-09-19 NOTE — Patient Instructions (Signed)
You have been scheduled for an endoscopy and colonoscopy. Please follow the written instructions given to you at your visit today. Please pick up your prep at the pharmacy within the next 1-3 days. If you use inhalers (even only as needed), please bring them with you on the day of your procedure. Your physician has requested that you go to www.startemmi.com and enter the access code given to you at your visit today. This web site gives a general overview about your procedure. However, you should still follow specific instructions given to you by our office regarding your preparation for the procedure.  Go to the basement for your  hemoccult test Hold Iron 7 days before your procedure and Hemoccult test

## 2013-09-19 NOTE — Assessment & Plan Note (Signed)
Patient clearly has an iron deficiency anemia although Hemoccults have been negative.  Nonetheless chronic GI bleeding should be ruled out.  Doubt malabsorption.  Patient states that she bleeds from her blisters which can also be a source for iron deficiency.  Recommendations #1 colonoscopy and upper endoscopy-to be done at the same setting

## 2013-09-19 NOTE — Addendum Note (Signed)
Addended by: Oda Kilts on: 09/19/2013 03:48 PM   Modules accepted: Orders

## 2013-09-27 ENCOUNTER — Telehealth (INDEPENDENT_AMBULATORY_CARE_PROVIDER_SITE_OTHER): Payer: Self-pay | Admitting: General Surgery

## 2013-09-27 NOTE — Telephone Encounter (Signed)
Please call patient she wants to only want to talk to you, she wants to talk to you about her surgery that she had back in March 2015

## 2013-09-28 ENCOUNTER — Telehealth (INDEPENDENT_AMBULATORY_CARE_PROVIDER_SITE_OTHER): Payer: Self-pay

## 2013-09-28 NOTE — Telephone Encounter (Signed)
F/U call Patient states she is having pus draining from thigh and buttock .Denies temp  Urg Appt with Dr Marcello Moores 09/29/13 per DR. [employer]

## 2013-09-29 ENCOUNTER — Encounter (INDEPENDENT_AMBULATORY_CARE_PROVIDER_SITE_OTHER): Payer: Medicare Other | Admitting: General Surgery

## 2013-09-30 ENCOUNTER — Ambulatory Visit (INDEPENDENT_AMBULATORY_CARE_PROVIDER_SITE_OTHER): Payer: Medicare Other | Admitting: Surgery

## 2013-09-30 ENCOUNTER — Encounter (INDEPENDENT_AMBULATORY_CARE_PROVIDER_SITE_OTHER): Payer: Self-pay | Admitting: Surgery

## 2013-09-30 VITALS — BP 128/75 | HR 98 | Temp 98.1°F | Ht 63.0 in | Wt 265.0 lb

## 2013-09-30 DIAGNOSIS — L732 Hidradenitis suppurativa: Secondary | ICD-10-CM

## 2013-09-30 MED ORDER — SULFAMETHOXAZOLE-TRIMETHOPRIM 400-80 MG PO TABS: 1.0000 | ORAL_TABLET | Freq: Every day | ORAL | Status: AC

## 2013-09-30 NOTE — Progress Notes (Signed)
URGENT Office Kristy Harris 68 y.o.  Body mass index is 46.95 kg/(m^2).  Patient Active Problem List   Diagnosis Date Noted  . Iron deficiency anemia, unspecified 09/19/2013  . Abscess of left axilla 08/11/2013  . Abscess of left thigh 05/26/2013  . Abscess of buttock, right 06/13/2011  . Arteritis 02/27/2011  . Abnormal cardiovascular function study 02/15/2011  . Peripheral vascular disease 02/15/2011  . Diabetes mellitus 02/15/2011  . Preop cardiovascular exam 01/24/2011  . Dyspnea 01/24/2011  . Hypertension 01/24/2011  . Hyperlipidemia 01/24/2011  . Obesity 01/24/2011  . Bruit 01/24/2011  . Murmur 01/24/2011    Allergies  Allergen Reactions  . Penicillins Rash    All over the body    Past Surgical History  Procedure Laterality Date  . Pilonidal cyst excision    . Bladder suspension      x 2  . Abcess drainage    . Cardiac catheterization  02/21/2011    Normal coronary arteries, normal EF  . Tonsillectomy  yrs ago  . Abdominal hysterectomy  25 yrs ago  . Irrigation and debridement abscess Right 12/23/2012    Procedure: incision  AND DEBRIDEMENT right buttock infection ;  Surgeon: Shann Medal, MD;  Location: [facility];  Service: General;  Laterality: Right;   [name], MD No diagnosis found.  Seen in the urgent office although her primary care doctor is closed and we were unable to get a precertification. She has some soreness beneath her left axilla this ongoing chronic hidradenitis. She also has an area between her legs that is draining. She has been taking some doxycycline. However like to add some Bactrim DS and Hibiclens to wash the area twice a day. Under back to see Dr. Lucia Gaskins in 4 weeks. This appears to be chronic hidradenitis. Her type 2 diabetes I discussed dietary measures to try to lower her sugars through diet. Her body habitus and obesity made made this occurs that this will be able to be efficacious. [name], MD, [facility] [phone] beeper [phone]  09/30/2013 4:57 PM

## 2013-09-30 NOTE — Patient Instructions (Signed)
Good Carbs:  Brocccoli, asparagus, spinach, mustard greens,    Intermediate:  Beans  Bad Carbs: Potatoes (white, sweet, or [place] Fries), carrots, squash,   Get Hibiclens at your pharmacy-over the counter.  Use to wash beneath both arms and down in your groin at bedtime and in the morning.  Dry areas with a hair dryer on low and dress with gauze.

## 2013-10-05 ENCOUNTER — Other Ambulatory Visit: Payer: Self-pay | Admitting: Cardiology

## 2013-10-06 ENCOUNTER — Telehealth (INDEPENDENT_AMBULATORY_CARE_PROVIDER_SITE_OTHER): Payer: Self-pay

## 2013-10-06 NOTE — Telephone Encounter (Signed)
F/U call Patient states she is abt which is helping her wounds are looking better. Advised her to call if her condition changes

## 2013-10-27 ENCOUNTER — Encounter (INDEPENDENT_AMBULATORY_CARE_PROVIDER_SITE_OTHER): Payer: Self-pay

## 2013-10-27 ENCOUNTER — Other Ambulatory Visit (INDEPENDENT_AMBULATORY_CARE_PROVIDER_SITE_OTHER): Payer: Self-pay

## 2013-10-27 ENCOUNTER — Encounter (INDEPENDENT_AMBULATORY_CARE_PROVIDER_SITE_OTHER): Payer: Self-pay | Admitting: Surgery

## 2013-10-27 ENCOUNTER — Other Ambulatory Visit (INDEPENDENT_AMBULATORY_CARE_PROVIDER_SITE_OTHER): Payer: Self-pay | Admitting: Surgery

## 2013-10-27 ENCOUNTER — Ambulatory Visit (INDEPENDENT_AMBULATORY_CARE_PROVIDER_SITE_OTHER): Payer: Medicare Other | Admitting: Surgery

## 2013-10-27 VITALS — BP 140/76 | HR 96 | Temp 98.3°F | Resp 18 | Ht 63.0 in | Wt 267.0 lb

## 2013-10-27 DIAGNOSIS — L02416 Cutaneous abscess of left lower limb: Secondary | ICD-10-CM

## 2013-10-27 DIAGNOSIS — L03119 Cellulitis of unspecified part of limb: Secondary | ICD-10-CM

## 2013-10-27 DIAGNOSIS — IMO0002 Reserved for concepts with insufficient information to code with codable children: Secondary | ICD-10-CM

## 2013-10-27 DIAGNOSIS — L02412 Cutaneous abscess of left axilla: Secondary | ICD-10-CM

## 2013-10-27 DIAGNOSIS — L02419 Cutaneous abscess of limb, unspecified: Secondary | ICD-10-CM

## 2013-10-27 NOTE — Progress Notes (Addendum)
[name], MD,  Kristy Harris.,  Kristy Harris, [place]    Kristy Harris Phone:  [phone] FAX:  [phone]   Re:   Kristy Harris DOB:   1945-09-21 MRN:   [id number]  ASSESSMENT AND PLAN: 1.  Left axillary abscess - 5 cm abscess with surrounding tunneling.  (Hidradinitis)  This has gotten worse. She does not want to try to drain this in the office.  So we will schedule this at the hospital with plans to excise the wound, leave it open, and let it heal with secondary intention.  I reviewed the risks of surgery, which include, bleeding, non healing wound, and recurrence.  2.  Left pubic area abscess - 4 cm area  I will I&D or debride this at the same time as the left axilla  3.  Abscess, both buttocks.  Hidradinitis.  Excision of right buttocks infection - 12/23/2012 - D. Lucia Harris  This has Harris very well.  We talked about doing the left side, but she said that she is doing well enough to not have anything Harris.  4.  Area in the left upper thigh  [photo - 05/26/2013 visit]  This actually looks okay. 5.  Morbid obesity - BMI 46.8  She has lost 20 pounds since 06/18/2011  She knows that this a problem that she needs to address.  We have talked about this is a big cause of her recurrent infections.  She had a peak weight of 331, so she is down in weight, but has stalled around 270. 6.  Diabetes mellitus  Not on insulin.  She said that her last BS was 96. 7.  Using a cane to walk.  Limited mobility. 8.  Aortic stenosis, mild  Cardiac nuclear scan - 02/02/2014 - EF 58%  [place] - 02/21/2011  Dr. Percival Harris has seen the patient.  She saw KristyDalton Aundra Harris for pre op clearance - 11/2012.  This is last note I can find in Epic - since it has been a year since she has been seen - she will need cardiac clearance again.  [Card clearance from Dr. Marigene Harris on 10/28/2013.  DN  11/13/2013] 9.  Chronic lower extremity edema. 10.  She is for a colonoscopy by  Dr. Deatra Harris on 11/18/2013 (she did not mention this to me)  HISTORY OF PRESENT ILLNESS: Chief Complaint  Patient presents with  . Routine Post Op    reck wound- abscess    Kristy Harris is a 68 y.o. (DOB: March 22, 1945)  AA female who is a patient of Kristy Mustache, MD and comes to me follow up of an abscess in her left axilla.. Her daughter is with her.  I last saw Kristy Harris on 08/11/2013.  She saw Dr. Hassell Harris 09/30/2013.  Her right axilla is now worse and she is draining from her left pubic area. We talked about draining these in the office - but she wants this Harris in the OR.  And I can do a more complete job there. So we will plan an excision of these infected areas with healing by secondary intention. She thinks that he health has been stable since last year.  She has not seen cardiology in on year.  History of buttocks infection (11/2012): I last saw Kristy Harris last April 2013, when I did an I&D of her right buttocks on 06/13/2011 in the urgent office.   She has seen Dr. Allyson Harris over the last year.  He has treated  her with Accutane x 6 months, until March 2014.  The he switched he to another medicine, which she does not remember the name of.  She said that she got "shots" in May and June - but she is unsure of what she was getting.  She is supposed to see Dr. Allyson Harris back on 11/19/2012. She has wounds of both buttocks which are painful and draining. She has lost some more weight since I saw her last year. She does have Kristy Harris come by her home every two months to check her diabetes.  Current Outpatient Prescriptions  Medication Sig Dispense Refill  . acetaminophen (TYLENOL) 325 MG tablet Take 325-650 mg by mouth every 6 (six) hours as needed for pain.      Marland Kitchen albuterol (PROVENTIL HFA;VENTOLIN HFA) 108 (90 BASE) MCG/ACT inhaler Inhale 2 puffs into the lungs every 4 (four) hours as needed for shortness of breath.      Marland Kitchen aspirin 81 MG tablet Take 81 mg by mouth every morning.       Marland Kitchen atorvastatin  (LIPITOR) 20 MG tablet Take 20 mg by mouth every morning.       . Cholecalciferol (VITAMIN D-3) 5000 UNITS TABS Take 5,000 Units by mouth every morning.      Marland Kitchen glipiZIDE (GLUCOTROL XL) 10 MG 24 hr tablet Take 10 mg by mouth every morning.       Marland Kitchen HYDROcodone-acetaminophen (NORCO/VICODIN) 5-325 MG per tablet Take 1 tablet by mouth every 8 (eight) hours as needed.  30 tablet  0  . lisinopril (PRINIVIL,ZESTRIL) 40 MG tablet TAKE ONE (1) TABLET EACH DAY  30 tablet  0  . metFORMIN (GLUCOPHAGE-XR) 500 MG 24 hr tablet Take 500 mg by mouth 2 (two) times daily.       . metoprolol (TOPROL-XL) 100 MG 24 hr tablet Take 100 mg by mouth every morning.       . mupirocin ointment (BACTROBAN) 2 % Apply 1 application topically daily as needed (Applies buttocks.).       Marland Kitchen sodium chloride irrigation 0.9 % irrigation       . sulfamethoxazole-trimethoprim (BACTRIM,SEPTRA) 400-80 MG per tablet       . verapamil (CALAN) 120 MG tablet Take 120 mg by mouth daily.        No current facility-administered medications for this visit.   Review of Systems: 1. Aortic stenosis: Mild AS on 12/12 echo. 2. HTN: She is on a rather complicated regimen with multiple BP meds. She is on both valsartan and lisinopril. I will have her stop valsartan and increase lisinopril to 40 mg daily.  3. Exertional dyspnea: She is not very active. She is not volume overloaded on exam. No exertional chest pain. Normal cath in 12/12.   Social History: Unmarried.   Grandson lives with her at night.   Daughter, Kristy Harris, are with patient. She also has a and granddaughter, Kristy Harris.  PHYSICAL EXAM: BP 140/76  Pulse 96  Temp(Src) 98.3 F (36.8 C) (Oral)  Resp 18  Ht 5\' 3"  (1.6 m)  Wt 267 lb (121.11 kg)  BMI 47.31 kg/m2  General: Obese AA F who is alert.  She uses a cane to walk  Lungs - Clear Heart -  2/6 systolic murmur.  RRR. Abdomen - large, no mass Left axilla - wound with minimal drainage in posterior axilla (apprx 5 cm) Left  suprapubic area - 4 cm area of inflammation/abscess Left thigh  Open wound with granulation tissue.  Clean and no drainage.  DATA REVIEWED:  None new.  Alphonsa Overall, MD, [place] Office:  [phone]

## 2013-10-31 ENCOUNTER — Telehealth: Payer: Self-pay | Admitting: Cardiology

## 2013-10-31 NOTE — Telephone Encounter (Signed)
Received request from Nurse fax box, documents faxed for surgical clearance. To: [facility] Fax number: [phone] Attention: 8.31.15/km

## 2013-11-04 ENCOUNTER — Other Ambulatory Visit: Payer: Self-pay | Admitting: *Deleted

## 2013-11-04 MED ORDER — LISINOPRIL 40 MG PO TABS: ORAL_TABLET | ORAL | Status: DC

## 2013-11-18 ENCOUNTER — Ambulatory Visit (AMBULATORY_SURGERY_CENTER): Payer: Medicare Other | Admitting: Gastroenterology

## 2013-11-18 ENCOUNTER — Encounter: Payer: Self-pay | Admitting: Gastroenterology

## 2013-11-18 VITALS — BP 102/73 | HR 84 | Temp 98.9°F | Resp 25 | Ht 62.5 in | Wt 268.0 lb

## 2013-11-18 DIAGNOSIS — D1330 Benign neoplasm of unspecified part of small intestine: Secondary | ICD-10-CM

## 2013-11-18 DIAGNOSIS — K2990 Gastroduodenitis, unspecified, without bleeding: Secondary | ICD-10-CM | POA: Diagnosis not present

## 2013-11-18 DIAGNOSIS — D509 Iron deficiency anemia, unspecified: Secondary | ICD-10-CM

## 2013-11-18 DIAGNOSIS — K2970 Gastritis, unspecified, without bleeding: Secondary | ICD-10-CM | POA: Diagnosis not present

## 2013-11-18 DIAGNOSIS — K5730 Diverticulosis of large intestine without perforation or abscess without bleeding: Secondary | ICD-10-CM

## 2013-11-18 DIAGNOSIS — A048 Other specified bacterial intestinal infections: Secondary | ICD-10-CM

## 2013-11-18 HISTORY — PX: COLONOSCOPY WITH PROPOFOL: SHX5780

## 2013-11-18 HISTORY — PX: ESOPHAGOGASTRODUODENOSCOPY (EGD) WITH PROPOFOL: SHX5813

## 2013-11-18 LAB — GLUCOSE, CAPILLARY
GLUCOSE-CAPILLARY: 96 mg/dL (ref 70–99)
Glucose-Capillary: 117 mg/dL — ABNORMAL HIGH (ref 70–99)

## 2013-11-18 MED ORDER — MUPIROCIN 2 % EX OINT: 1.0000 "application " | TOPICAL_OINTMENT | Freq: Two times a day (BID) | CUTANEOUS | Status: DC

## 2013-11-18 MED ORDER — FAMOTIDINE 40 MG PO TABS: 40.0000 mg | ORAL_TABLET | Freq: Every day | ORAL | Status: DC

## 2013-11-18 MED ORDER — SODIUM CHLORIDE 0.9 % IV SOLN: 500.0000 mL | INTRAVENOUS | Status: DC

## 2013-11-18 NOTE — Progress Notes (Signed)
Small soft area of bleeding under skin after IV removed right hand.  Pressure dressing applied, and hand elevated.

## 2013-11-18 NOTE — Op Note (Signed)
[place]  [address]. [place], [address]   COLONOSCOPY PROCEDURE REPORT  PATIENT: Kristy Harris, Kristy Harris  MR#: [id number] BIRTHDATE: 30-Jun-1945 , 23  yrs. old GENDER: Female ENDOSCOPIST: Inda Castle, MD REFERRED GH:[id number] Edrick Oh, M.D. PROCEDURE DATE:  11/18/2013 PROCEDURE:   Colonoscopy, diagnostic First Screening Colonoscopy - Avg.  risk and is 50 yrs.  old or older Yes.  Prior Negative Screening - Now for repeat screening. N/A  History of Adenoma - Now for follow-up colonoscopy & has been > or = to 3 yrs.  N/A  Polyps Removed Today? No.  Recommend repeat exam, <10 yrs? No. ASA CLASS:   Class II INDICATIONS:Iron Deficiency Anemia. MEDICATIONS: MAC sedation, administered by CRNA and propofol (Diprivan) 250mg  IV  DESCRIPTION OF PROCEDURE:   After the risks benefits and alternatives of the procedure were thoroughly explained, informed consent was obtained.  A digital rectal exam revealed no abnormalities of the rectum.   The LB [id number]  endoscope was introduced through the anus and advanced to the cecum, which was identified by both the appendix and ileocecal valve. No adverse events experienced.   The quality of the prep was Suprep good  The instrument was then slowly withdrawn as the colon was fully examined.      COLON FINDINGS: There was severe diverticulosis noted in the sigmoid colon with associated muscular hypertrophy.   The colon was otherwise normal.  There was no diverticulosis, inflammation, polyps or cancers unless previously stated.  Retroflexed views revealed no abnormalities. The time to cecum=4 minutes 11 seconds. Withdrawal time=7 minutes 38 seconds.  The scope was withdrawn and the procedure completed. COMPLICATIONS: There were no complications.  ENDOSCOPIC IMPRESSION: 1.   There was severe diverticulosis noted in the sigmoid colon 2.   The colon was otherwise normal  RECOMMENDATIONS: Colonoscopy 10 years   eSigned:   Inda Castle, MD 11/18/2013 3:59 PM   cc:   PATIENT NAME:  Jaidy, Cottam MR#: [id number]

## 2013-11-18 NOTE — Progress Notes (Signed)
Report to PACU, RN, vss, BBS= Clear.  

## 2013-11-18 NOTE — Op Note (Signed)
[place]  [address]. [place], [address]   ENDOSCOPY PROCEDURE REPORT  PATIENT: Kristy Harris, Kristy Harris  MR#: [id number] BIRTHDATE: 1945/06/17 , 45  yrs. old GENDER: Female ENDOSCOPIST: Inda Castle, MD REFERRED BY:  Dione Housekeeper, M.D. PROCEDURE DATE:  11/18/2013 PROCEDURE:  EGD w/ biopsy ASA CLASS:     Class II INDICATIONS:  Iron deficiency anemia. MEDICATIONS: There was residual sedation effect present from prior procedure, MAC sedation, administered by CRNA, and propofol (Diprivan) 150mg  IV TOPICAL ANESTHETIC:  DESCRIPTION OF PROCEDURE: After the risks benefits and alternatives of the procedure were thoroughly explained, informed consent was obtained.  The LB [id number] endoscope was introduced through the mouth and advanced to the third portion of the duodenum. Without limitations.  The instrument was slowly withdrawn as the mucosa was fully examined.        STOMACH: Moderate erosive gastritis (inflammation) with specks of old blood was found on the anterior wall of the gastric antrum and greater curvature of the gastric body.  There were erosions present.  Multiple biopsies were performed.  The remainder of the upper endoscopy exam was otherwise normal. Retroflexed views revealed no abnormalities.   multiple biopsies were taken in the second and third portions of the duodenum in the duodenal bulb to rule out celiac disease.  The scope was then withdrawn from the patient and the procedure completed.  COMPLICATIONS: There were no complications. ENDOSCOPIC IMPRESSION: 1.  gastritis  findings do not explain chronic iron deficiency anemia  RECOMMENDATIONS: Await biopsy results Pepcid 40mg  qd REPEAT EXAM:  eSigned:  Inda Castle, MD 11/18/2013 4:05 PM   CC:

## 2013-11-18 NOTE — Progress Notes (Signed)
Called to room to assist during endoscopic procedure.  Patient ID and intended procedure confirmed with present staff. Received instructions for my participation in the procedure from the performing physician.  

## 2013-11-18 NOTE — Patient Instructions (Addendum)
YOU HAD AN ENDOSCOPIC PROCEDURE TODAY AT THE [facility]: Refer to the procedure report that was given to you for any specific questions about what was found during the examination.  If the procedure report does not answer your questions, please call your gastroenterologist to clarify.  If you requested that your care partner not be given the details of your procedure findings, then the procedure report has been included in a sealed envelope for you to review at your convenience later.  YOU SHOULD EXPECT: Some feelings of bloating in the abdomen. Passage of more gas than usual.  Walking can help get rid of the air that was put into your GI tract during the procedure and reduce the bloating. If you had a lower endoscopy (such as a colonoscopy or flexible sigmoidoscopy) you may notice spotting of blood in your stool or on the toilet paper. If you underwent a bowel prep for your procedure, then you may not have a normal bowel movement for a few days.  DIET: Your first meal following the procedure should be a light meal and then it is ok to progress to your normal diet.  A half-sandwich or bowl of soup is an example of a good first meal.  Heavy or fried foods are harder to digest and may make you feel nauseous or bloated.  Likewise meals heavy in dairy and vegetables can cause extra gas to form and this can also increase the bloating.  Drink plenty of fluids but you should avoid alcoholic beverages for 24 hours.  ACTIVITY: Your care partner should take you home directly after the procedure.  You should plan to take it easy, moving slowly for the rest of the day.  You can resume normal activity the day after the procedure however you should NOT DRIVE or use heavy machinery for 24 hours (because of the sedation medicines used during the test).    SYMPTOMS TO REPORT IMMEDIATELY: A gastroenterologist can be reached at any hour.  During normal business hours, 8:30 AM to 5:00 PM Monday through Friday,  call [phone].  After hours and on weekends, please call the GI answering service at [phone] who will take a message and have the physician on call contact you.   Following lower endoscopy (colonoscopy or flexible sigmoidoscopy):  Excessive amounts of blood in the stool  Significant tenderness or worsening of abdominal pains  Swelling of the abdomen that is new, acute  Fever of 100F or higher  Following upper endoscopy (EGD)  Vomiting of blood or coffee ground material  New chest pain or pain under the shoulder blades  Painful or persistently difficult swallowing  New shortness of breath  Fever of 100F or higher  Black, tarry-looking stools  FOLLOW UP: If any biopsies were taken you will be contacted by phone or by letter within the next 1-3 weeks.  Call your gastroenterologist if you have not heard about the biopsies in 3 weeks.  Our staff will call the home number listed on your records the next business day following your procedure to check on you and address any questions or concerns that you may have at that time regarding the information given to you following your procedure. This is a courtesy call and so if there is no answer at the home number and we have not heard from you through the emergency physician on call, we will assume that you have returned to your regular daily activities without incident.  SIGNATURES/CONFIDENTIALITY: You and/or your care   partner have signed paperwork which will be entered into your electronic medical record.  These signatures attest to the fact that that the information above on your After Visit Summary has been reviewed and is understood.  Full responsibility of the confidentiality of this discharge information lies with you and/or your care-partner.  Gastritis information given. Pepcid 40mg  every day.  Diverticulosis and high fiber information  given. Next colonoscopy 10 years-2025.

## 2013-11-21 ENCOUNTER — Telehealth: Payer: Self-pay | Admitting: *Deleted

## 2013-11-21 NOTE — Telephone Encounter (Signed)
  Follow up Call-  Call back number 11/18/2013  Post procedure Call Back phone  # [phone]  Permission to leave phone message No     Patient questions:  Do you have a fever, pain , or abdominal swelling? No. Pain Score  0 *  Have you tolerated food without any problems? Yes.    Have you been able to return to your normal activities? Yes.    Do you have any questions about your discharge instructions: Diet   No. Medications  No. Follow up visit  No.  Do you have questions or concerns about your Care? No.  Actions: * If pain score is 4 or above: No action needed, pain <4.

## 2013-11-24 ENCOUNTER — Telehealth: Payer: Self-pay | Admitting: Cardiology

## 2013-11-24 ENCOUNTER — Encounter (INDEPENDENT_AMBULATORY_CARE_PROVIDER_SITE_OTHER): Payer: Self-pay | Admitting: Surgery

## 2013-11-24 NOTE — Telephone Encounter (Signed)
Spoke with crystal, made aware this is a patient of dr Aundra Dubin and clearance was faxed to them 10-31-13 per telephone note in the pts chart. She is going to relook for clearance, if she can not find she is going to re-fax to dr Aundra Dubin

## 2013-11-24 NOTE — Telephone Encounter (Signed)
Received request from Nurse fax box, documents faxed for surgical clearance. To: [facility] Fax number: [phone] Attention: 9.24.15/km/ REFAXED

## 2013-11-24 NOTE — Telephone Encounter (Signed)
She sent a fax over for clarence on 10-27-13 and still have not received it back, Need this asap. Please fax to [phone] FEO:[id number]

## 2013-11-25 ENCOUNTER — Encounter: Payer: Self-pay | Admitting: Gastroenterology

## 2013-11-25 ENCOUNTER — Telehealth: Payer: Self-pay | Admitting: *Deleted

## 2013-11-25 MED ORDER — BIS SUBCIT-METRONID-TETRACYC 140-125-125 MG PO CAPS: 3.0000 | ORAL_CAPSULE | Freq: Three times a day (TID) | ORAL | Status: DC

## 2013-11-25 MED ORDER — OMEPRAZOLE 20 MG PO CPDR: 20.0000 mg | DELAYED_RELEASE_CAPSULE | Freq: Two times a day (BID) | ORAL | Status: DC

## 2013-11-25 NOTE — Telephone Encounter (Signed)
Message copied by Oda Kilts on Fri Nov 25, 2013  2:00 PM ------      Message from: Erskine Emery D      Created: Fri Nov 25, 2013 10:05 AM       Has H. pylori.  Please prescribe Prevpac ------

## 2013-11-25 NOTE — Telephone Encounter (Signed)
Okay to use pylera

## 2013-11-25 NOTE — Telephone Encounter (Signed)
Dr Deatra Ina, Was going to prescribe Prevpac. She has an allergy to penicillians What do you want to prescribe

## 2013-11-25 NOTE — Telephone Encounter (Signed)
Patient aware to pick up prescriptions from pharmacy for [facility]

## 2013-11-28 ENCOUNTER — Telehealth: Payer: Self-pay | Admitting: Gastroenterology

## 2013-11-28 NOTE — Telephone Encounter (Signed)
Try helidac

## 2013-11-28 NOTE — Telephone Encounter (Signed)
Called patient Insurance will not pay for Pylera   She has an allergy to PCN   What else can we send her Dr Deatra Ina?

## 2013-12-01 ENCOUNTER — Other Ambulatory Visit: Payer: Self-pay | Admitting: Cardiology

## 2013-12-07 ENCOUNTER — Encounter (HOSPITAL_COMMUNITY): Payer: Self-pay | Admitting: Pharmacy Technician

## 2013-12-07 ENCOUNTER — Encounter (HOSPITAL_COMMUNITY): Payer: Self-pay | Admitting: *Deleted

## 2013-12-08 ENCOUNTER — Encounter (HOSPITAL_COMMUNITY): Payer: Self-pay | Admitting: *Deleted

## 2013-12-08 ENCOUNTER — Encounter (HOSPITAL_COMMUNITY)
Admission: RE | Disposition: A | Discharge status: Home / Self Care | Payer: Self-pay | Source: Ambulatory Visit | Attending: Surgery

## 2013-12-08 ENCOUNTER — Encounter (HOSPITAL_COMMUNITY): Payer: Medicare Other | Admitting: Anesthesiology

## 2013-12-08 ENCOUNTER — Ambulatory Visit (HOSPITAL_COMMUNITY): Payer: Medicare Other

## 2013-12-08 ENCOUNTER — Ambulatory Visit (HOSPITAL_COMMUNITY): Payer: Medicare Other | Admitting: Anesthesiology

## 2013-12-08 ENCOUNTER — Observation Stay (HOSPITAL_COMMUNITY)
Admission: RE | Admit: 2013-12-08 | Discharge: 2013-12-09 | Disposition: A | Discharge status: Home / Self Care | Payer: Medicare Other | Source: Ambulatory Visit | Attending: Surgery | Admitting: Surgery

## 2013-12-08 DIAGNOSIS — L732 Hidradenitis suppurativa: Secondary | ICD-10-CM | POA: Diagnosis not present

## 2013-12-08 DIAGNOSIS — E119 Type 2 diabetes mellitus without complications: Secondary | ICD-10-CM | POA: Diagnosis not present

## 2013-12-08 DIAGNOSIS — R0600 Dyspnea, unspecified: Secondary | ICD-10-CM | POA: Insufficient documentation

## 2013-12-08 DIAGNOSIS — Z01811 Encounter for preprocedural respiratory examination: Secondary | ICD-10-CM

## 2013-12-08 DIAGNOSIS — L02412 Cutaneous abscess of left axilla: Principal | ICD-10-CM | POA: Insufficient documentation

## 2013-12-08 DIAGNOSIS — Z6841 Body Mass Index (BMI) 40.0 and over, adult: Secondary | ICD-10-CM | POA: Diagnosis not present

## 2013-12-08 DIAGNOSIS — I350 Nonrheumatic aortic (valve) stenosis: Secondary | ICD-10-CM | POA: Insufficient documentation

## 2013-12-08 DIAGNOSIS — L0291 Cutaneous abscess, unspecified: Secondary | ICD-10-CM | POA: Diagnosis present

## 2013-12-08 HISTORY — PX: HYDRADENITIS EXCISION: SHX5243

## 2013-12-08 HISTORY — PX: EXCISION HYDRADENITIS LABIA: SHX6273

## 2013-12-08 LAB — GLUCOSE, CAPILLARY
GLUCOSE-CAPILLARY: 102 mg/dL — AB (ref 70–99)
Glucose-Capillary: 97 mg/dL (ref 70–99)
Glucose-Capillary: 112 mg/dL — ABNORMAL HIGH (ref 70–99)
Glucose-Capillary: 152 mg/dL — ABNORMAL HIGH (ref 70–99)

## 2013-12-08 LAB — CBC WITH DIFFERENTIAL/PLATELET
BASOS PCT: 0 % (ref 0–1)
Basophils Absolute: 0 10*3/uL (ref 0.0–0.1)
Eosinophils Absolute: 0.2 10*3/uL (ref 0.0–0.7)
Eosinophils Relative: 3 % (ref 0–5)
HCT: 27.7 % — ABNORMAL LOW (ref 36.0–46.0)
Hemoglobin: 8.2 g/dL — ABNORMAL LOW (ref 12.0–15.0)
LYMPHS PCT: 31 % (ref 12–46)
Lymphs Abs: 2 10*3/uL (ref 0.7–4.0)
MCH: 20.1 pg — ABNORMAL LOW (ref 26.0–34.0)
MCHC: 29.6 g/dL — ABNORMAL LOW (ref 30.0–36.0)
MCV: 67.9 fL — ABNORMAL LOW (ref 78.0–100.0)
MONO ABS: 0.3 10*3/uL (ref 0.1–1.0)
MONOS PCT: 5 % (ref 3–12)
NEUTROS PCT: 61 % (ref 43–77)
Neutro Abs: 3.9 10*3/uL (ref 1.7–7.7)
PLATELETS: 353 10*3/uL (ref 150–400)
RBC: 4.08 MIL/uL (ref 3.87–5.11)
RDW: 18.2 % — ABNORMAL HIGH (ref 11.5–15.5)
WBC: 6.4 10*3/uL (ref 4.0–10.5)

## 2013-12-08 LAB — COMPREHENSIVE METABOLIC PANEL
ALK PHOS: 94 U/L (ref 39–117)
ALT: 8 U/L (ref 0–35)
AST: 8 U/L (ref 0–37)
Albumin: 2.9 g/dL — ABNORMAL LOW (ref 3.5–5.2)
Anion gap: 12 (ref 5–15)
BUN: 12 mg/dL (ref 6–23)
CHLORIDE: 100 meq/L (ref 96–112)
CO2: 27 meq/L (ref 19–32)
Calcium: 8.8 mg/dL (ref 8.4–10.5)
Creatinine, Ser: 0.56 mg/dL (ref 0.50–1.10)
GFR calc Af Amer: >90 mL/min (ref >90)
GLUCOSE: 90 mg/dL (ref 70–99)
POTASSIUM: 4.3 meq/L (ref 3.7–5.3)
SODIUM: 139 meq/L (ref 137–147)
Total Bilirubin: 0.3 mg/dL (ref 0.3–1.2)
Total Protein: 8.1 g/dL (ref 6.0–8.3)

## 2013-12-08 SURGERY — EXCISION, HIDRADENITIS, AXILLA
Anesthesia: General | Site: Groin

## 2013-12-08 MED ORDER — ROCURONIUM BROMIDE 100 MG/10ML IV SOLN
INTRAVENOUS | Status: DC | PRN
  Administered 2013-12-08: 10 mg via INTRAVENOUS
  Administered 2013-12-08: 40 mg via INTRAVENOUS

## 2013-12-08 MED ORDER — CHLORHEXIDINE GLUCONATE 4 % EX LIQD: 1.0000 "application " | Freq: Once | CUTANEOUS | Status: DC

## 2013-12-08 MED ORDER — PROPOFOL 10 MG/ML IV BOLUS
INTRAVENOUS | Status: AC
Start: 2013-12-08 — End: 2013-12-08
  Filled 2013-12-08: qty 20

## 2013-12-08 MED ORDER — FENTANYL CITRATE 0.05 MG/ML IJ SOLN
INTRAMUSCULAR | Status: AC
  Filled 2013-12-08: qty 2

## 2013-12-08 MED ORDER — FENTANYL CITRATE 0.05 MG/ML IJ SOLN
25.0000 ug | INTRAMUSCULAR | Status: DC | PRN
  Administered 2013-12-08 (×3): 50 ug via INTRAVENOUS

## 2013-12-08 MED ORDER — ONDANSETRON HCL 4 MG/2ML IJ SOLN
INTRAMUSCULAR | Status: DC | PRN
  Administered 2013-12-08: 4 mg via INTRAVENOUS

## 2013-12-08 MED ORDER — PROPOFOL 10 MG/ML IV BOLUS
INTRAVENOUS | Status: DC | PRN
  Administered 2013-12-08: 150 mg via INTRAVENOUS

## 2013-12-08 MED ORDER — POTASSIUM CHLORIDE IN NACL 20-0.45 MEQ/L-% IV SOLN
INTRAVENOUS | Status: DC
  Administered 2013-12-08: 17:00:00 via INTRAVENOUS
  Filled 2013-12-08 (×4): qty 1000

## 2013-12-08 MED ORDER — ALBUTEROL SULFATE (2.5 MG/3ML) 0.083% IN NEBU: 2.5000 mg | INHALATION_SOLUTION | RESPIRATORY_TRACT | Status: DC | PRN

## 2013-12-08 MED ORDER — INSULIN ASPART 100 UNIT/ML ~~LOC~~ SOLN
0.0000 [IU] | SUBCUTANEOUS | Status: DC
  Administered 2013-12-08: 4 [IU] via SUBCUTANEOUS

## 2013-12-08 MED ORDER — HEPARIN SODIUM (PORCINE) 5000 UNIT/ML IJ SOLN
5000.0000 [IU] | Freq: Three times a day (TID) | INTRAMUSCULAR | Status: DC
  Administered 2013-12-09: 5000 [IU] via SUBCUTANEOUS
  Filled 2013-12-08 (×4): qty 1

## 2013-12-08 MED ORDER — GLYCOPYRROLATE 0.2 MG/ML IJ SOLN
INTRAMUSCULAR | Status: AC
  Filled 2013-12-08: qty 4

## 2013-12-08 MED ORDER — HYDROCODONE-ACETAMINOPHEN 5-325 MG PO TABS
1.0000 | ORAL_TABLET | ORAL | Status: DC | PRN
  Administered 2013-12-09: 1 via ORAL
  Filled 2013-12-08 (×2): qty 1

## 2013-12-08 MED ORDER — LIDOCAINE HCL (CARDIAC) 20 MG/ML IV SOLN
INTRAVENOUS | Status: AC
  Filled 2013-12-08: qty 5

## 2013-12-08 MED ORDER — ATORVASTATIN CALCIUM 20 MG PO TABS
20.0000 mg | ORAL_TABLET | Freq: Every morning | ORAL | Status: DC
  Administered 2013-12-08 – 2013-12-09 (×2): 20 mg via ORAL
  Filled 2013-12-08 (×2): qty 1

## 2013-12-08 MED ORDER — FENTANYL CITRATE 0.05 MG/ML IJ SOLN
INTRAMUSCULAR | Status: DC | PRN
  Administered 2013-12-08 (×5): 50 ug via INTRAVENOUS

## 2013-12-08 MED ORDER — NEOSTIGMINE METHYLSULFATE 10 MG/10ML IV SOLN
INTRAVENOUS | Status: AC
  Filled 2013-12-08: qty 1

## 2013-12-08 MED ORDER — NEOSTIGMINE METHYLSULFATE 10 MG/10ML IV SOLN
INTRAVENOUS | Status: DC | PRN
  Administered 2013-12-08: 4 mg via INTRAVENOUS

## 2013-12-08 MED ORDER — HYDROMORPHONE HCL 1 MG/ML IJ SOLN
INTRAMUSCULAR | Status: AC
  Filled 2013-12-08: qty 1

## 2013-12-08 MED ORDER — MUPIROCIN 2 % EX OINT: 1.0000 "application " | TOPICAL_OINTMENT | Freq: Two times a day (BID) | CUTANEOUS | Status: DC | PRN

## 2013-12-08 MED ORDER — ONDANSETRON HCL 4 MG/2ML IJ SOLN
INTRAMUSCULAR | Status: AC
  Filled 2013-12-08: qty 2

## 2013-12-08 MED ORDER — LACTATED RINGERS IV SOLN: INTRAVENOUS | Status: DC

## 2013-12-08 MED ORDER — LISINOPRIL 40 MG PO TABS
40.0000 mg | ORAL_TABLET | Freq: Every day | ORAL | Status: DC
  Administered 2013-12-09: 40 mg via ORAL
  Filled 2013-12-08: qty 1

## 2013-12-08 MED ORDER — ACETAMINOPHEN 325 MG PO TABS: 325.0000 mg | ORAL_TABLET | Freq: Four times a day (QID) | ORAL | Status: DC | PRN

## 2013-12-08 MED ORDER — VERAPAMIL HCL 120 MG PO TABS
120.0000 mg | ORAL_TABLET | Freq: Every day | ORAL | Status: DC
  Administered 2013-12-09: 120 mg via ORAL
  Filled 2013-12-08: qty 1

## 2013-12-08 MED ORDER — DEXTROSE 5 % IV SOLN
2.0000 g | INTRAVENOUS | Status: DC | PRN
  Administered 2013-12-08: 2 g via INTRAVENOUS

## 2013-12-08 MED ORDER — METOPROLOL SUCCINATE ER 100 MG PO TB24
100.0000 mg | ORAL_TABLET | Freq: Every day | ORAL | Status: DC
  Administered 2013-12-09: 100 mg via ORAL
  Filled 2013-12-08: qty 1

## 2013-12-08 MED ORDER — LACTATED RINGERS IV SOLN
INTRAVENOUS | Status: DC
  Administered 2013-12-08: 11:00:00 via INTRAVENOUS
  Administered 2013-12-08: 1000 mL via INTRAVENOUS

## 2013-12-08 MED ORDER — METFORMIN HCL ER 500 MG PO TB24
500.0000 mg | ORAL_TABLET | Freq: Two times a day (BID) | ORAL | Status: DC
  Administered 2013-12-09: 500 mg via ORAL
  Filled 2013-12-08 (×3): qty 1

## 2013-12-08 MED ORDER — GLYCOPYRROLATE 0.2 MG/ML IJ SOLN
INTRAMUSCULAR | Status: DC | PRN
  Administered 2013-12-08: .8 mg via INTRAVENOUS

## 2013-12-08 MED ORDER — FENTANYL CITRATE 0.05 MG/ML IJ SOLN
INTRAMUSCULAR | Status: AC
  Filled 2013-12-08: qty 5

## 2013-12-08 MED ORDER — GLIPIZIDE ER 10 MG PO TB24
10.0000 mg | ORAL_TABLET | Freq: Every day | ORAL | Status: DC
  Administered 2013-12-09: 10 mg via ORAL
  Filled 2013-12-08 (×2): qty 1

## 2013-12-08 MED ORDER — HYDROCODONE-ACETAMINOPHEN 5-325 MG PO TABS: 1.0000 | ORAL_TABLET | Freq: Three times a day (TID) | ORAL | Status: DC | PRN

## 2013-12-08 MED ORDER — SUCCINYLCHOLINE CHLORIDE 20 MG/ML IJ SOLN
INTRAMUSCULAR | Status: DC | PRN
  Administered 2013-12-08: 100 mg via INTRAVENOUS

## 2013-12-08 MED ORDER — LIDOCAINE-EPINEPHRINE 1 %-1:100000 IJ SOLN
INTRAMUSCULAR | Status: AC
  Filled 2013-12-08: qty 1

## 2013-12-08 MED ORDER — INFLUENZA VAC SPLIT QUAD 0.5 ML IM SUSY
0.5000 mL | PREFILLED_SYRINGE | INTRAMUSCULAR | Status: AC
  Administered 2013-12-09: 0.5 mL via INTRAMUSCULAR
  Filled 2013-12-08 (×2): qty 0.5

## 2013-12-08 MED ORDER — LIDOCAINE HCL (CARDIAC) 20 MG/ML IV SOLN
INTRAVENOUS | Status: DC | PRN
  Administered 2013-12-08: 50 mg via INTRAVENOUS

## 2013-12-08 MED ORDER — HYDROMORPHONE HCL 1 MG/ML IJ SOLN
0.5000 mg | INTRAMUSCULAR | Status: DC | PRN
  Administered 2013-12-08 (×2): 0.5 mg via INTRAVENOUS

## 2013-12-08 MED ORDER — ROCURONIUM BROMIDE 100 MG/10ML IV SOLN
INTRAVENOUS | Status: AC
  Filled 2013-12-08: qty 1

## 2013-12-08 MED ORDER — DEXTROSE 5 % IV SOLN
INTRAVENOUS | Status: AC
  Filled 2013-12-08: qty 2

## 2013-12-08 MED ORDER — FAMOTIDINE 40 MG PO TABS
40.0000 mg | ORAL_TABLET | Freq: Every day | ORAL | Status: DC
  Administered 2013-12-08 – 2013-12-09 (×2): 40 mg via ORAL
  Filled 2013-12-08 (×2): qty 1

## 2013-12-08 MED ORDER — MORPHINE SULFATE 2 MG/ML IJ SOLN
1.0000 mg | INTRAMUSCULAR | Status: DC | PRN
  Administered 2013-12-08 – 2013-12-09 (×2): 2 mg via INTRAVENOUS
  Filled 2013-12-08 (×2): qty 1

## 2013-12-08 SURGICAL SUPPLY — 41 items
BANDAGE ELASTIC 6 VELCRO ST LF (GAUZE/BANDAGES/DRESSINGS) IMPLANT
BLADE SURG 15 STRL LF DISP TIS (BLADE) ×6 IMPLANT
BLADE SURG 15 STRL SS (BLADE) ×6
CANISTER SUCTION 2500CC (MISCELLANEOUS) ×4 IMPLANT
CLEANER TIP ELECTROSURG 2X2 (MISCELLANEOUS) ×4 IMPLANT
CLOSURE WOUND 1/2 X4 (GAUZE/BANDAGES/DRESSINGS) ×2
DECANTER SPIKE VIAL GLASS SM (MISCELLANEOUS) ×4 IMPLANT
DRAPE LAPAROTOMY TRNSV 102X78 (DRAPE) ×8 IMPLANT
ELECT COATED BLADE 2.86 ST (ELECTRODE) ×4 IMPLANT
ELECT REM PT RETURN 9FT ADLT (ELECTROSURGICAL) ×4
ELECTRODE REM PT RTRN 9FT ADLT (ELECTROSURGICAL) ×2 IMPLANT
GAUZE SPONGE 4X4 12PLY STRL (GAUZE/BANDAGES/DRESSINGS) ×8 IMPLANT
GAUZE SPONGE 4X4 16PLY XRAY LF (GAUZE/BANDAGES/DRESSINGS) ×4 IMPLANT
GLOVE BIOGEL PI IND STRL 7.0 (GLOVE) ×2 IMPLANT
GLOVE BIOGEL PI INDICATOR 7.0 (GLOVE) ×2
GLOVE SURG SIGNA 7.5 PF LTX (GLOVE) ×8 IMPLANT
GOWN SPEC L4 XLG W/TWL (GOWN DISPOSABLE) ×8 IMPLANT
GOWN STRL REUS W/ TWL XL LVL3 (GOWN DISPOSABLE) ×6 IMPLANT
GOWN STRL REUS W/TWL LRG LVL3 (GOWN DISPOSABLE) ×4 IMPLANT
GOWN STRL REUS W/TWL XL LVL3 (GOWN DISPOSABLE) ×6
HOVERMATT SINGLE USE (MISCELLANEOUS) ×4 IMPLANT
KIT BASIN OR (CUSTOM PROCEDURE TRAY) ×4 IMPLANT
MARKER SKIN DUAL TIP RULER LAB (MISCELLANEOUS) ×4 IMPLANT
NEEDLE HYPO 22GX1.5 SAFETY (NEEDLE) IMPLANT
NEEDLE HYPO 25X1 1.5 SAFETY (NEEDLE) IMPLANT
PACK BASIC VI WITH GOWN DISP (CUSTOM PROCEDURE TRAY) ×4 IMPLANT
PAD ABD 8X10 STRL (GAUZE/BANDAGES/DRESSINGS) ×8 IMPLANT
PENCIL BUTTON HOLSTER BLD 10FT (ELECTRODE) ×4 IMPLANT
SCRUB PCMX 4 OZ (MISCELLANEOUS) ×8 IMPLANT
SOL PREP POV-IOD 4OZ 10% (MISCELLANEOUS) IMPLANT
SPONGE LAP 18X18 X RAY DECT (DISPOSABLE) ×16 IMPLANT
SPONGE LAP 4X18 X RAY DECT (DISPOSABLE) ×4 IMPLANT
STRIP CLOSURE SKIN 1/2X4 (GAUZE/BANDAGES/DRESSINGS) ×6 IMPLANT
SUT VIC AB 3-0 SH 18 (SUTURE) IMPLANT
SUT VIC AB 5-0 P-3 18XBRD (SUTURE) IMPLANT
SUT VIC AB 5-0 P3 18 (SUTURE)
SYR BULB IRRIGATION 50ML (SYRINGE) ×4 IMPLANT
SYR CONTROL 10ML LL (SYRINGE) IMPLANT
TAPE CLOTH SURG 6X10 WHT LF (GAUZE/BANDAGES/DRESSINGS) ×8 IMPLANT
TOWEL OR 17X26 10 PK STRL BLUE (TOWEL DISPOSABLE) ×12 IMPLANT
YANKAUER SUCT BULB TIP 10FT TU (MISCELLANEOUS) ×4 IMPLANT

## 2013-12-08 NOTE — Anesthesia Preprocedure Evaluation (Addendum)
Anesthesia Evaluation  Patient identified by MRN, date of birth, ID band Patient awake    Reviewed: Allergy & Precautions, H&P , NPO status , Patient's Chart, lab work & pertinent test results, reviewed documented beta blocker date and time   Airway Mallampati: III TM Distance: >3 FB Neck ROM: full    Dental no notable dental hx. (+) Teeth Intact, Dental Advisory Given   Pulmonary shortness of breath and with exertion, sleep apnea , COPD COPD inhaler, former smoker,  No CPAP since weight loss breath sounds clear to auscultation  Pulmonary exam normal       Cardiovascular Exercise Tolerance: Poor hypertension, Pt. on medications and Pt. on home beta blockers Rhythm:regular Rate:Normal  Enlarged heart   Neuro/Psych negative neurological ROS  negative psych ROS   GI/Hepatic negative GI ROS, Neg liver ROS, GERD-  Medicated and Controlled,  Endo/Other  diabetes, Well Controlled, Type 2, Oral Hypoglycemic AgentsMorbid obesity  Renal/GU negative Renal ROS  negative genitourinary   Musculoskeletal   Abdominal (+) + obese,   Peds  Hematology negative hematology ROS (+) anemia , hgb 8.2   Anesthesia Other Findings   Reproductive/Obstetrics negative OB ROS                          Anesthesia Physical Anesthesia Plan  ASA: III  Anesthesia Plan: General   Post-op Pain Management:    Induction: Intravenous  Airway Management Planned: Oral ETT  Additional Equipment:   Intra-op Plan:   Post-operative Plan: Extubation in OR  Informed Consent: I have reviewed the patients History and Physical, chart, labs and discussed the procedure including the risks, benefits and alternatives for the proposed anesthesia with the patient or authorized representative who has indicated his/her understanding and acceptance.   Dental Advisory Given  Plan Discussed with: CRNA and Surgeon  Anesthesia Plan Comments:          Anesthesia Quick Evaluation

## 2013-12-08 NOTE — Op Note (Signed)
12/08/2013  1:30 PM  PATIENT:  Kristy Harris, 68 y.o., female, MRN: [id number]  PREOP DIAGNOSIS:  abscess/hidradinitis left axilla/pubic area  POSTOP DIAGNOSIS:   Abscess/hidridinits of left axilla and suprapubic area x 3  PROCEDURE:   Procedure(s): EXCISION HIDRADENITIS/Abscess Left AXILLA (6 x 14 cm), EXCISION HIDRADENITIS/ Abscess x 3 PUBIC AREA (largest area 4 x 12 cm) [photos at the end of the note]  SURGEON:   Alphonsa Overall, M.D.  ASSISTANT:   none  ANESTHESIA:   general  Anesthesiologist: Peyton Najjar, MD CRNA: Anne Fu, CRNA  General  EBL:  150  ml  BLOOD ADMINISTERED: none  DRAINS: none   LOCAL MEDICATIONS USED:   none  SPECIMEN:   Abscess skin  COUNTS CORRECT:  YES  INDICATIONS FOR PROCEDURE:  Kristy Harris is a 68 y.o. (DOB: [birth year]) AA  female whose primary care physician is [name], MD and comes for excision of abscess and hidridinitis of left axilla and suprapubic area.   The indications and risks of the surgery were explained to the patient.  The risks include, but are not limited to, infection, bleeding, and nerve injury.  Note dictated to:    The patient was taken to OR #1 at [facility]. She underwent a general anesthetic.  A time out was held and the surgical checklist run.  I removed the two areas through two preps.  Her left axilla and suprapubic area were prepped.   She had an abscess of her left axilla and an abscess of her left suprapubic area.  She had two additional and smaller areas of her upper thigh.  I excised chronically inflamed skin tissue, sinus tracks, and thickened reactive subcutaneous tissues from the left axilla and the left pubic area.  She also had two areas down on her anterior thigh that were excised. The final excision was 6 x 14 cm for her left axilla and area 4 x 12 cm of the left suprapubic area.  I irrigated the wound with saline and packed it with saline curlex.. The wound was sterilely dressed.   Sponge and needle count were correct at the end of the case.   I will keep the patient overnight to start dressing changes and go from there. She was transferred to the recovery room in good condition.    Left axillary wound - 12/08/2013    Left pubic area - 12/08/2013  Alphonsa Overall, MD, [facility] Pager: [phone] Office phone:  [phone]

## 2013-12-08 NOTE — H&P (Signed)
[place], MD, [place] [place], [place]  Phone: [phone] FAX: [phone]   Re: Kristy Harris  DOB: 1945/12/11  MRN: [id number]   ASSESSMENT AND PLAN:  1. Left axillary abscess - 5 cm abscess with surrounding tunneling. (Hidradinitis)   This has gotten worse. She does not want to try to drain this in the office. So we will schedule this at the hospital with plans to excise the wound, leave it open, and let it heal with secondary intention. I reviewed the risks of surgery, which include, bleeding, non healing wound, and recurrence.  2. Left pubic area abscess - 4 cm area   I will I&D or debride this at the same time as the left axilla  3. Abscess, both buttocks. Hidradinitis.   Excision of right buttocks infection - 12/23/2012 - D. Lucia Gaskins  This has done very well.  We talked about doing the left side, but she said that she is doing well enough to not have anything done.  4. Area in the left upper thigh [photo - 05/26/2013 visit]   This actually looks okay.   5. Morbid obesity - BMI 46.8   She has lost 20 pounds since 06/18/2011   She knows that this a problem that she needs to address.   We have talked about this is a big cause of her recurrent infections. She had a peak weight of 331, so she is down in weight, but has stalled around 270.  6. Diabetes mellitus   Not on insulin.   She said that her last BS was 96.  7. Using a cane to walk.   Limited mobility.  8. Aortic stenosis, mild   Cardiac nuclear scan - 02/02/2014 - EF 58%    Dr. Percival Spanish has seen the patient. She saw Dr.Dalton Aundra Dubin for pre op clearance - 11/2012. This is last note I can find in Epic - since it has been a year since she has been seen - she will need cardiac clearance again.  [Card clearance from Dr. Marigene Ehlers on 10/28/2013. DN 11/13/2013]  9. Chronic lower extremity edema.  10. She is for a colonoscopy by Dr. Deatra Ina on 11/18/2013 (she  did not mention this to me)   HISTORY OF PRESENT ILLNESS:  Chief Complaint   Patient presents with   .  Routine Post Op     reck wound- abscess    Kristy Harris is a 68 y.o. (DOB: 12/16/1962) AA female who is a patient of [name], MD and comes to me follow up of an abscess in her left axilla..  Her daughter is with her.   I last saw Kristy Harris on 08/11/2013. She saw Dr. Hassell Done 09/30/2013. Her right axilla is now worse and she is draining from her left pubic area.  We talked about draining these in the office - but she wants this done in the OR. And I can do a more complete job there.  So we will plan an excision of these infected areas with healing by secondary intention.  She thinks that he health has been stable since last year. She has not seen cardiology in on year.   History of buttocks infection (11/2012):  I last saw Kristy Harris last April 2013, when I did an I&D of her right buttocks on 06/13/2011 in the urgent office.  She has seen Dr. Allyson Sabal over the last year. He has treated her with Accutane x 6  months, until March 2014. The he switched he to another medicine, which she does not remember the name of. She said that she got "shots" in May and June - but she is unsure of what she was getting. She is supposed to see Dr. Allyson Sabal back on 11/19/2012.  She has wounds of both buttocks which are painful and draining.  She has lost some more weight since I saw her last year.  She does have [place] come by her home every two months to check her diabetes.   Current Outpatient Prescriptions   Medication  Sig  Dispense  Refill   .  acetaminophen (TYLENOL) 325 MG tablet  Take 325-650 mg by mouth every 6 (six) hours as needed for pain.     Marland Kitchen  albuterol (PROVENTIL HFA;VENTOLIN HFA) 108 (90 BASE) MCG/ACT inhaler  Inhale 2 puffs into the lungs every 4 (four) hours as needed for shortness of breath.     Marland Kitchen  aspirin 81 MG tablet  Take 81 mg by mouth every morning.     Marland Kitchen  atorvastatin (LIPITOR) 20 MG  tablet  Take 20 mg by mouth every morning.     .  Cholecalciferol (VITAMIN D-3) 5000 UNITS TABS  Take 5,000 Units by mouth every morning.     Marland Kitchen  glipiZIDE (GLUCOTROL XL) 10 MG 24 hr tablet  Take 10 mg by mouth every morning.     Marland Kitchen  HYDROcodone-acetaminophen (NORCO/VICODIN) 5-325 MG per tablet  Take 1 tablet by mouth every 8 (eight) hours as needed.  30 tablet  0   .  lisinopril (PRINIVIL,ZESTRIL) 40 MG tablet  TAKE ONE (1) TABLET EACH DAY  30 tablet  0   .  metFORMIN (GLUCOPHAGE-XR) 500 MG 24 hr tablet  Take 500 mg by mouth 2 (two) times daily.     .  metoprolol (TOPROL-XL) 100 MG 24 hr tablet  Take 100 mg by mouth every morning.     .  mupirocin ointment (BACTROBAN) 2 %  Apply 1 application topically daily as needed (Applies buttocks.).     Marland Kitchen  sodium chloride irrigation 0.9 % irrigation      .  sulfamethoxazole-trimethoprim (BACTRIM,SEPTRA) 400-80 MG per tablet      .  verapamil (CALAN) 120 MG tablet  Take 120 mg by mouth daily.      No current facility-administered medications for this visit.    Review of Systems:  1. Aortic stenosis: Mild AS on 12/12 echo.  2. HTN: She is on a rather complicated regimen with multiple BP meds. She is on both valsartan and lisinopril. I will have her stop valsartan and increase lisinopril to 40 mg daily.  3. Exertional dyspnea: She is not very active. She is not volume overloaded on exam. No exertional chest pain. Normal cath in 12/12.  Social History:  Unmarried.  Grandson lives with her at night.  Daughter, Kristy Harris, are with patient.  She also has a and granddaughter, Kristy Harris.   PHYSICAL EXAM:  BP 140/76  Pulse 96  Temp(Src) 98.3 F (36.8 C) (Oral)  Resp 18  Ht 5\' 3"  (1.6 m)  Wt 267 lb (121.11 kg)  BMI 47.31 kg/m2  General: Obese AA F who is alert. She uses a cane to walk  Lungs - Clear  Heart - 2/6 systolic murmur. RRR.  Abdomen - large, no mass  Left axilla - wound with minimal drainage in posterior axilla (apprx 5 cm)  Left suprapubic  area - 4 cm area of inflammation/abscess  Left thigh Open wound with granulation tissue. Clean and no drainage.   DATA REVIEWED:  None new.  Alphonsa Overall, MD, [place]  Office: [phone]

## 2013-12-08 NOTE — Transfer of Care (Signed)
Immediate Anesthesia Transfer of Care Note  Patient: Kristy Harris  Procedure(s) Performed: Procedure(s) (LRB): EXCISION HIDRADENITIS AXILLA (Left) EXCISION HIDRADENITIS PUBIC AREA (N/A)  Patient Location: PACU  Anesthesia Type: General  Level of Consciousness: sedated, patient cooperative and responds to stimulation  Airway & Oxygen Therapy: Patient Spontanous Breathing and Patient connected to face mask oxgen  Post-op Assessment: Report given to PACU RN and Post -op Vital signs reviewed and stable  Post vital signs: Reviewed and stable  Complications: No apparent anesthesia complications

## 2013-12-08 NOTE — Anesthesia Postprocedure Evaluation (Signed)
  Anesthesia Post-op Note  Patient: Kristy Harris  Procedure(s) Performed: Procedure(s) (LRB): EXCISION HIDRADENITIS AXILLA (Left) EXCISION HIDRADENITIS PUBIC AREA (N/A)  Patient Location: PACU  Anesthesia Type: General  Level of Consciousness: awake and alert   Airway and Oxygen Therapy: Patient Spontanous Breathing  Post-op Pain: mild  Post-op Assessment: Post-op Vital signs reviewed, Patient's Cardiovascular Status Stable, Respiratory Function Stable, Patent Airway and No signs of Nausea or vomiting  Last Vitals:  Filed Vitals:   12/08/13 1400  BP: 148/67  Pulse: 60  Temp:   Resp: 15    Post-op Vital Signs: stable   Complications: No apparent anesthesia complications

## 2013-12-08 NOTE — Addendum Note (Signed)
Addendum created 12/08/13 1439 by Peyton Najjar, MD   Modules edited: Orders

## 2013-12-09 ENCOUNTER — Encounter (HOSPITAL_COMMUNITY): Payer: Self-pay | Admitting: Surgery

## 2013-12-09 DIAGNOSIS — L02412 Cutaneous abscess of left axilla: Secondary | ICD-10-CM | POA: Diagnosis not present

## 2013-12-09 LAB — GLUCOSE, CAPILLARY
Glucose-Capillary: 116 mg/dL — ABNORMAL HIGH (ref 70–99)
Glucose-Capillary: 118 mg/dL — ABNORMAL HIGH (ref 70–99)
Glucose-Capillary: 112 mg/dL — ABNORMAL HIGH (ref 70–99)
Glucose-Capillary: 86 mg/dL (ref 70–99)

## 2013-12-09 NOTE — Progress Notes (Signed)
Discharge instructions and prescriptions given to patient at discharge.  Await transportation home

## 2013-12-09 NOTE — Progress Notes (Signed)
UR completed 

## 2013-12-09 NOTE — Discharge Instructions (Signed)
[facility] - DISCHARGE INSTRUCTIONS TO PATIENT  Activity:  Lifting - No limites  Wound Care:   Change dressings 2 to 3 times per day.  Place saline damp guaze over the wounds.           Get in the shower as much as possible and wash the wounds with soap and water.  Diet:  As tolerated  Follow up appointment:  Call Dr. Pollie Friar office [facility]) at [phone] for an appointment in about 3 weeks.  You can be seen earlier in the office by one of my partners, if necessary.  Medications and dosages:  Resume your home medications.  Call Dr. Lucia Gaskins or his office  [phone]) if you have:  Temperature greater than 100.4,  Persistent nausea and vomiting,  Severe uncontrolled pain,  Redness, tenderness, or signs of infection (pain, swelling, redness, odor or green/yellow discharge around the site),  Difficulty breathing, headache or visual disturbances,  Any other questions or concerns you may have after discharge.  In an emergency, call 911 or go to an Emergency Department at a nearby hospital.

## 2013-12-09 NOTE — Care Management Note (Signed)
    Page 1 of 1   12/09/2013     10:36:00 AM CARE MANAGEMENT NOTE 12/09/2013  Patient:  Kristy Harris, Kristy Harris   Account Number:  [id number]  Date Initiated:  12/09/2013  Documentation initiated by:  Sunday Spillers  Subjective/Objective Assessment:   68 yo female admitted s/p I&D of axillary abscess. PTA lived at home.     Action/Plan:   Home when stable   Anticipated DC Date:  12/09/2013   Anticipated DC Plan:  [place]  CM consult      [facility] Choice  Resumption Of Svcs/PTA Provider   Choice offered to / List presented to:          [facility] arranged  HH-1 RN  [place].   Status of service:  Completed, signed off Medicare Important Message given?   (If response is "NO", the following Medicare IM given date fields will be blank) Date Medicare IM given:   Medicare IM given by:   Date Additional Medicare IM given:   Additional Medicare IM given by:    Discharge Disposition:  [place]  Per UR Regulation:  Reviewed for med. necessity/level of care/duration of stay  If discussed at [place] of Stay Meetings, dates discussed:    Comments:  12-09-13 [place] 1034 Patient active with [facility] prior to admission, will continue with [facility] for dressing changes.

## 2013-12-09 NOTE — Discharge Summary (Signed)
Physician Discharge Summary  Patient ID:  Kristy Harris  MRN: [id number]  DOB/AGE: 1946-03-03 68 y.o.  Admit date: 12/08/2013 Discharge date: 12/09/2013  Discharge Diagnoses:  1. Left axillary abscess - 5 cm abscess with surrounding tunneling. (Hidradinitis)   2. Left pubic area abscess - 4 cm area   3. Abscess, both buttocks. Hidradinitis.   Excision of right buttocks infection - 12/23/2012 - D. Corneshia Hines  4. Area in the left upper thigh [photo - 05/26/2013 visit]   5. Morbid obesity - BMI 46.8   She has lost 20 pounds since 06/18/2011  6. Diabetes mellitus   Not on insulin.    7. Using a cane to walk.   Limited mobility.  8. Aortic stenosis, mild   Cardiac nuclear scan - 02/02/2014 - EF 58%   [place] - 02/21/2011   Card clearance from Dr. Marigene Ehlers on 10/28/2013.     Active Problems:   Abscess of skin and subcutaneous tissue   Operation: Procedure(s):  EXCISION HIDRADENITIS Left AXILLA,  EXCISION HIDRADENITIS PUBIC AREA on 12/08/2013 - D. Cherrie Gauze in chart]  Discharged Condition: good  Hospital Course: Kristy Harris is an 68 y.o. female whose primary care physician is [name], MD and who was admitted 12/08/2013 with a chief complaint of abscess of left axilla and suprapubic area.   She was brought to the operating room on 12/08/2013 and underwent  EXCISION HIDRADENITIS Left AXILLA,  EXCISION HIDRADENITIS PUBIC AREA  .   The discharge instructions were reviewed with the patient.  Consults: None  Significant Diagnostic Studies: Results for orders placed during the hospital encounter of 12/08/13  CBC WITH DIFFERENTIAL      Result Value Ref Range   WBC 6.4  4.0 - 10.5 K/uL   RBC 4.08  3.87 - 5.11 MIL/uL   Hemoglobin 8.2 (*) 12.0 - 15.0 g/dL   HCT 27.7 (*) 36.0 - 46.0 %   MCV 67.9 (*) 78.0 - 100.0 fL   MCH 20.1 (*) 26.0 - 34.0 pg   MCHC 29.6 (*) 30.0 - 36.0 g/dL   RDW 18.2 (*) 11.5 - 15.5 %   Platelets 353  150 - 400 K/uL   Neutrophils Relative % 61  43 -  77 %   Lymphocytes Relative 31  12 - 46 %   Monocytes Relative 5  3 - 12 %   Eosinophils Relative 3  0 - 5 %   Basophils Relative 0  0 - 1 %   Neutro Abs 3.9  1.7 - 7.7 K/uL   Lymphs Abs 2.0  0.7 - 4.0 K/uL   Monocytes Absolute 0.3  0.1 - 1.0 K/uL   Eosinophils Absolute 0.2  0.0 - 0.7 K/uL   Basophils Absolute 0.0  0.0 - 0.1 K/uL   Smear Review MORPHOLOGY UNREMARKABLE    COMPREHENSIVE METABOLIC PANEL      Result Value Ref Range   Sodium 139  137 - 147 mEq/L   Potassium 4.3  3.7 - 5.3 mEq/L   Chloride 100  96 - 112 mEq/L   CO2 27  19 - 32 mEq/L   Glucose, Bld 90  70 - 99 mg/dL   BUN 12  6 - 23 mg/dL   Creatinine, Ser 0.56  0.50 - 1.10 mg/dL   Calcium 8.8  8.4 - 10.5 mg/dL   Total Protein 8.1  6.0 - 8.3 g/dL   Albumin 2.9 (*) 3.5 - 5.2 g/dL   AST 8  0 - 37 U/L  ALT 8  0 - 35 U/L   Alkaline Phosphatase 94  39 - 117 U/L   Total Bilirubin 0.3  0.3 - 1.2 mg/dL   GFR calc non Af Amer >90  >90 mL/min   GFR calc Af Amer >90  >90 mL/min   Anion gap 12  5 - 15  GLUCOSE, CAPILLARY      Result Value Ref Range   Glucose-Capillary 97  70 - 99 mg/dL   Comment 1 Notify RN    GLUCOSE, CAPILLARY      Result Value Ref Range   Glucose-Capillary 102 (*) 70 - 99 mg/dL   Comment 1 Documented in Chart     Comment 2 Notify RN    GLUCOSE, CAPILLARY      Result Value Ref Range   Glucose-Capillary 112 (*) 70 - 99 mg/dL  GLUCOSE, CAPILLARY      Result Value Ref Range   Glucose-Capillary 152 (*) 70 - 99 mg/dL  GLUCOSE, CAPILLARY      Result Value Ref Range   Glucose-Capillary 116 (*) 70 - 99 mg/dL  GLUCOSE, CAPILLARY      Result Value Ref Range   Glucose-Capillary 118 (*) 70 - 99 mg/dL  GLUCOSE, CAPILLARY      Result Value Ref Range   Glucose-Capillary 112 (*) 70 - 99 mg/dL    Dg Chest 2 View  12/08/2013   CLINICAL DATA:  Preoperative assessment for surgery for nonhealingboils under left arm pit and left groin  EXAM: CHEST  2 VIEW  COMPARISON:  12/15/2012  FINDINGS: Stable mild cardiac  enlargement. Vascular pattern normal. 2 bands of scarring or discoid atelectasis in the lingula are stable. The lungs are otherwise clear. No pleural effusions.  IMPRESSION: No active cardiopulmonary disease.   Electronically Signed   By: Skipper Cliche M.D.   On: 12/08/2013 09:37    Discharge Exam:  Filed Vitals:   12/09/13 0519  BP: 113/60  Pulse: 73  Temp: 97.8 F (36.6 C)  Resp: 18    General: Obese AA F who is alert and generally healthy appearing.  Lungs: Clear to auscultation and symmetric breath sounds. Heart:  RRR. No murmur or rub. Wounds:  Left axilla - clean  Suprapubic area - clean  Discharge Medications:     Medication List    ASK your doctor about these medications       acetaminophen 325 MG tablet  Commonly known as:  TYLENOL  Take 325-650 mg by mouth every 6 (six) hours as needed for pain.     albuterol 108 (90 BASE) MCG/ACT inhaler  Commonly known as:  PROVENTIL HFA;VENTOLIN HFA  Inhale 2 puffs into the lungs every 4 (four) hours as needed for shortness of breath.     aspirin 81 MG tablet  Take 81 mg by mouth every morning.     atorvastatin 20 MG tablet  Commonly known as:  LIPITOR  Take 20 mg by mouth every morning.     famotidine 40 MG tablet  Commonly known as:  PEPCID  Take 40 mg by mouth every morning.     glipiZIDE 10 MG 24 hr tablet  Commonly known as:  GLUCOTROL XL  Take 10 mg by mouth every morning.     HYDROcodone-acetaminophen 5-325 MG per tablet  Commonly known as:  NORCO/VICODIN  Take 1 tablet by mouth every 8 (eight) hours as needed for moderate pain.     lisinopril 40 MG tablet  Commonly known as:  PRINIVIL,ZESTRIL  Take  40 mg by mouth every morning.     metFORMIN 500 MG 24 hr tablet  Commonly known as:  GLUCOPHAGE-XR  Take 500 mg by mouth 2 (two) times daily.     metoprolol succinate 100 MG 24 hr tablet  Commonly known as:  TOPROL-XL  Take 100 mg by mouth every morning.     mupirocin ointment 2 %  Commonly known as:   BACTROBAN  Apply 1 application topically 2 (two) times daily as needed (Applies buttocks.).     verapamil 120 MG tablet  Commonly known as:  CALAN  Take 120 mg by mouth every morning.     Vitamin D-3 5000 UNITS Tabs  Take 5,000 Units by mouth every morning.        Disposition: 06-Home-Health Care Svc    Activity:  Lifting - No limits  Wound Care:   Change dressings 2 to 3 times per day.  Place saline damp guaze over the wounds.           Get in the shower as much as possible and wash the wounds with soap and water.  Diet:  As tolerated  Follow up appointment:  Call Dr. Pollie Friar office [facility]) at [phone] for an appointment in about 3 weeks.  You can be seen earlier in the office by one of my partners, if necessary.  Medications and dosages:  Resume your home medications.      Signed: Alphonsa Overall, M.D., [facility] Office:  [phone]  12/09/2013, 7:34 AM

## 2013-12-12 ENCOUNTER — Telehealth: Payer: Self-pay | Admitting: Gastroenterology

## 2013-12-12 MED ORDER — METRONID-TETRACYC-BIS SUBSAL PO MISC: 1.0000 | ORAL | Status: DC

## 2013-12-12 NOTE — Telephone Encounter (Signed)
Med sent we will see if insurance will cover this

## 2013-12-12 NOTE — Telephone Encounter (Signed)
Okay.  Please prescribe separately at the same doses as she would receive with Helidac

## 2013-12-12 NOTE — Telephone Encounter (Signed)
Dr Deatra Ina, We need to prescribe this patient separately Amoxicillin,crythromycin and which other antibiotic or  bismuth   Insurance will not pay for anything I have sent her in. Will be cheaper to send in antibiotics separate

## 2013-12-14 MED ORDER — BISMUTH SUBSALICYLATE 262 MG PO TABS: ORAL_TABLET | ORAL | Status: DC

## 2013-12-14 MED ORDER — TETRACYCLINE HCL 500 MG PO CAPS: 500.0000 mg | ORAL_CAPSULE | Freq: Four times a day (QID) | ORAL | Status: DC

## 2013-12-14 MED ORDER — METRONIDAZOLE 500 MG PO TABS: 500.0000 mg | ORAL_TABLET | Freq: Two times a day (BID) | ORAL | Status: DC

## 2013-12-14 NOTE — Telephone Encounter (Signed)
Got with Janett Billow on how to prescribe the antibiotics separately. Spoke with pharmacy these meds separate are on her formulary.

## 2013-12-20 ENCOUNTER — Other Ambulatory Visit: Payer: Self-pay | Admitting: Cardiology

## 2014-01-02 ENCOUNTER — Other Ambulatory Visit: Payer: Self-pay | Admitting: Cardiology

## 2014-03-03 DIAGNOSIS — C50919 Malignant neoplasm of unspecified site of unspecified female breast: Secondary | ICD-10-CM

## 2014-03-03 HISTORY — DX: Malignant neoplasm of unspecified site of unspecified female breast: C50.919

## 2014-05-28 ENCOUNTER — Encounter (HOSPITAL_COMMUNITY): Payer: Self-pay | Admitting: *Deleted

## 2014-05-28 ENCOUNTER — Inpatient Hospital Stay (HOSPITAL_COMMUNITY)
Admission: EM | Admit: 2014-05-28 | Discharge: 2014-05-30 | DRG: 378 | Disposition: A | Discharge status: Home / Self Care | Payer: Medicare Other | Attending: Internal Medicine | Admitting: Internal Medicine

## 2014-05-28 ENCOUNTER — Emergency Department (HOSPITAL_COMMUNITY): Payer: Medicare Other

## 2014-05-28 DIAGNOSIS — L02818 Cutaneous abscess of other sites: Secondary | ICD-10-CM

## 2014-05-28 DIAGNOSIS — E119 Type 2 diabetes mellitus without complications: Secondary | ICD-10-CM | POA: Diagnosis present

## 2014-05-28 DIAGNOSIS — K922 Gastrointestinal hemorrhage, unspecified: Secondary | ICD-10-CM | POA: Diagnosis present

## 2014-05-28 DIAGNOSIS — Z833 Family history of diabetes mellitus: Secondary | ICD-10-CM

## 2014-05-28 DIAGNOSIS — E1165 Type 2 diabetes mellitus with hyperglycemia: Secondary | ICD-10-CM

## 2014-05-28 DIAGNOSIS — K5731 Diverticulosis of large intestine without perforation or abscess with bleeding: Secondary | ICD-10-CM | POA: Diagnosis not present

## 2014-05-28 DIAGNOSIS — Z87891 Personal history of nicotine dependence: Secondary | ICD-10-CM | POA: Diagnosis not present

## 2014-05-28 DIAGNOSIS — M1990 Unspecified osteoarthritis, unspecified site: Secondary | ICD-10-CM | POA: Diagnosis present

## 2014-05-28 DIAGNOSIS — Z7982 Long term (current) use of aspirin: Secondary | ICD-10-CM | POA: Diagnosis not present

## 2014-05-28 DIAGNOSIS — Z79891 Long term (current) use of opiate analgesic: Secondary | ICD-10-CM | POA: Diagnosis not present

## 2014-05-28 DIAGNOSIS — G4730 Sleep apnea, unspecified: Secondary | ICD-10-CM | POA: Diagnosis present

## 2014-05-28 DIAGNOSIS — B9681 Helicobacter pylori [H. pylori] as the cause of diseases classified elsewhere: Secondary | ICD-10-CM | POA: Diagnosis present

## 2014-05-28 DIAGNOSIS — A048 Other specified bacterial intestinal infections: Secondary | ICD-10-CM | POA: Diagnosis present

## 2014-05-28 DIAGNOSIS — Z801 Family history of malignant neoplasm of trachea, bronchus and lung: Secondary | ICD-10-CM | POA: Diagnosis not present

## 2014-05-28 DIAGNOSIS — Z6841 Body Mass Index (BMI) 40.0 and over, adult: Secondary | ICD-10-CM | POA: Diagnosis not present

## 2014-05-28 DIAGNOSIS — Z79899 Other long term (current) drug therapy: Secondary | ICD-10-CM

## 2014-05-28 DIAGNOSIS — E785 Hyperlipidemia, unspecified: Secondary | ICD-10-CM | POA: Diagnosis present

## 2014-05-28 DIAGNOSIS — K5900 Constipation, unspecified: Secondary | ICD-10-CM | POA: Diagnosis present

## 2014-05-28 DIAGNOSIS — L732 Hidradenitis suppurativa: Secondary | ICD-10-CM | POA: Diagnosis present

## 2014-05-28 DIAGNOSIS — I10 Essential (primary) hypertension: Secondary | ICD-10-CM | POA: Diagnosis present

## 2014-05-28 DIAGNOSIS — K2950 Unspecified chronic gastritis without bleeding: Secondary | ICD-10-CM | POA: Diagnosis present

## 2014-05-28 DIAGNOSIS — J449 Chronic obstructive pulmonary disease, unspecified: Secondary | ICD-10-CM | POA: Diagnosis present

## 2014-05-28 DIAGNOSIS — D509 Iron deficiency anemia, unspecified: Secondary | ICD-10-CM | POA: Diagnosis present

## 2014-05-28 DIAGNOSIS — Z880 Allergy status to penicillin: Secondary | ICD-10-CM | POA: Diagnosis not present

## 2014-05-28 DIAGNOSIS — L0291 Cutaneous abscess, unspecified: Secondary | ICD-10-CM | POA: Diagnosis present

## 2014-05-28 DIAGNOSIS — K625 Hemorrhage of anus and rectum: Secondary | ICD-10-CM | POA: Diagnosis not present

## 2014-05-28 DIAGNOSIS — Z8249 Family history of ischemic heart disease and other diseases of the circulatory system: Secondary | ICD-10-CM | POA: Diagnosis not present

## 2014-05-28 HISTORY — DX: Hidradenitis suppurativa: L73.2

## 2014-05-28 LAB — CBC WITH DIFFERENTIAL/PLATELET
Basophils Absolute: 0 10*3/uL (ref 0.0–0.1)
Basophils Relative: 0 % (ref 0–1)
Eosinophils Absolute: 0.1 10*3/uL (ref 0.0–0.7)
Eosinophils Relative: 2 % (ref 0–5)
HCT: 28.6 % — ABNORMAL LOW (ref 36.0–46.0)
Hemoglobin: 8.6 g/dL — ABNORMAL LOW (ref 12.0–15.0)
Lymphocytes Relative: 38 % (ref 12–46)
Lymphs Abs: 2.3 10*3/uL (ref 0.7–4.0)
MCH: 23.4 pg — ABNORMAL LOW (ref 26.0–34.0)
MCHC: 30.1 g/dL (ref 30.0–36.0)
MCV: 77.7 fL — ABNORMAL LOW (ref 78.0–100.0)
Monocytes Absolute: 0.3 10*3/uL (ref 0.1–1.0)
Monocytes Relative: 6 % (ref 3–12)
Neutro Abs: 3.3 10*3/uL (ref 1.7–7.7)
Neutrophils Relative %: 54 % (ref 43–77)
Platelets: 257 10*3/uL (ref 150–400)
RBC: 3.68 MIL/uL — ABNORMAL LOW (ref 3.87–5.11)
RDW: 18.8 % — ABNORMAL HIGH (ref 11.5–15.5)
WBC: 6.1 10*3/uL (ref 4.0–10.5)

## 2014-05-28 LAB — COMPREHENSIVE METABOLIC PANEL
ALT: 21 U/L (ref 0–35)
AST: 30 U/L (ref 0–37)
Albumin: 3.4 g/dL — ABNORMAL LOW (ref 3.5–5.2)
Alkaline Phosphatase: 104 U/L (ref 39–117)
Anion gap: 9 (ref 5–15)
BUN: 14 mg/dL (ref 6–23)
CO2: 27 mmol/L (ref 19–32)
Calcium: 8.7 mg/dL (ref 8.4–10.5)
Chloride: 102 mmol/L (ref 96–112)
Creatinine, Ser: 0.69 mg/dL (ref 0.50–1.10)
GFR calc Af Amer: >90 mL/min (ref >90)
GFR calc non Af Amer: 89 mL/min — ABNORMAL LOW (ref >90)
Glucose, Bld: 129 mg/dL — ABNORMAL HIGH (ref 70–99)
Potassium: 4.4 mmol/L (ref 3.5–5.1)
Sodium: 138 mmol/L (ref 135–145)
Total Bilirubin: 0.8 mg/dL (ref 0.3–1.2)
Total Protein: 7.5 g/dL (ref 6.0–8.3)

## 2014-05-28 LAB — URINALYSIS, ROUTINE W REFLEX MICROSCOPIC
Bilirubin Urine: NEGATIVE
Glucose, UA: NEGATIVE mg/dL
Ketones, ur: NEGATIVE mg/dL
Nitrite: NEGATIVE
Protein, ur: NEGATIVE mg/dL
Specific Gravity, Urine: 1.021 (ref 1.005–1.030)
Urobilinogen, UA: 0.2 mg/dL (ref 0.0–1.0)
pH: 6 (ref 5.0–8.0)

## 2014-05-28 LAB — GLUCOSE, CAPILLARY
GLUCOSE-CAPILLARY: 141 mg/dL — AB (ref 70–99)
Glucose-Capillary: 144 mg/dL — ABNORMAL HIGH (ref 70–99)

## 2014-05-28 LAB — URINE MICROSCOPIC-ADD ON

## 2014-05-28 LAB — HEMOGLOBIN: Hemoglobin: 8 g/dL — ABNORMAL LOW (ref 12.0–15.0)

## 2014-05-28 LAB — CLOSTRIDIUM DIFFICILE BY PCR: Toxigenic C. Difficile by PCR: NEGATIVE

## 2014-05-28 LAB — POC OCCULT BLOOD, ED: Fecal Occult Bld: POSITIVE — AB

## 2014-05-28 MED ORDER — ONDANSETRON HCL 4 MG PO TABS: 4.0000 mg | ORAL_TABLET | Freq: Four times a day (QID) | ORAL | Status: DC | PRN

## 2014-05-28 MED ORDER — VERAPAMIL HCL 120 MG PO TABS
120.0000 mg | ORAL_TABLET | Freq: Every day | ORAL | Status: DC
Start: 2014-05-29 — End: 2014-05-30
  Administered 2014-05-29 – 2014-05-30 (×2): 120 mg via ORAL
  Filled 2014-05-28 (×2): qty 1

## 2014-05-28 MED ORDER — METFORMIN HCL ER 500 MG PO TB24
500.0000 mg | ORAL_TABLET | Freq: Two times a day (BID) | ORAL | Status: DC
  Administered 2014-05-28 – 2014-05-30 (×3): 500 mg via ORAL
  Filled 2014-05-28 (×6): qty 1

## 2014-05-28 MED ORDER — METOPROLOL SUCCINATE ER 100 MG PO TB24
100.0000 mg | ORAL_TABLET | Freq: Every day | ORAL | Status: DC
  Administered 2014-05-29 – 2014-05-30 (×2): 100 mg via ORAL
  Filled 2014-05-28 (×2): qty 1

## 2014-05-28 MED ORDER — LOSARTAN POTASSIUM 50 MG PO TABS
100.0000 mg | ORAL_TABLET | Freq: Every day | ORAL | Status: DC
Start: 2014-05-28 — End: 2014-05-30
  Administered 2014-05-28 – 2014-05-30 (×3): 100 mg via ORAL
  Filled 2014-05-28 (×3): qty 2

## 2014-05-28 MED ORDER — ALUM & MAG HYDROXIDE-SIMETH 200-200-20 MG/5ML PO SUSP: 30.0000 mL | Freq: Four times a day (QID) | ORAL | Status: DC | PRN

## 2014-05-28 MED ORDER — ONDANSETRON HCL 4 MG/2ML IJ SOLN
4.0000 mg | Freq: Four times a day (QID) | INTRAMUSCULAR | Status: DC | PRN
  Administered 2014-05-29: 4 mg via INTRAVENOUS
  Filled 2014-05-28: qty 2

## 2014-05-28 MED ORDER — ATORVASTATIN CALCIUM 20 MG PO TABS
20.0000 mg | ORAL_TABLET | Freq: Every morning | ORAL | Status: DC
  Administered 2014-05-29 – 2014-05-30 (×2): 20 mg via ORAL
  Filled 2014-05-28 (×2): qty 1

## 2014-05-28 MED ORDER — HYDROCODONE-ACETAMINOPHEN 5-325 MG PO TABS
1.0000 | ORAL_TABLET | Freq: Four times a day (QID) | ORAL | Status: DC | PRN
  Administered 2014-05-29 – 2014-05-30 (×4): 1 via ORAL
  Filled 2014-05-28 (×4): qty 1

## 2014-05-28 MED ORDER — TETRACYCLINE HCL 250 MG PO CAPS
500.0000 mg | ORAL_CAPSULE | Freq: Four times a day (QID) | ORAL | Status: DC
  Administered 2014-05-28 – 2014-05-30 (×7): 500 mg via ORAL
  Filled 2014-05-28 (×10): qty 2

## 2014-05-28 MED ORDER — VITAMIN D3 25 MCG (1000 UNIT) PO TABS
5000.0000 [IU] | ORAL_TABLET | Freq: Every morning | ORAL | Status: DC
Start: 2014-05-29 — End: 2014-05-30
  Administered 2014-05-29 – 2014-05-30 (×2): 5000 [IU] via ORAL
  Filled 2014-05-28 (×2): qty 5

## 2014-05-28 MED ORDER — BISMUTH SUBSALICYLATE 262 MG PO CHEW
524.0000 mg | CHEWABLE_TABLET | Freq: Two times a day (BID) | ORAL | Status: DC
  Administered 2014-05-28 – 2014-05-30 (×4): 524 mg via ORAL
  Filled 2014-05-28 (×5): qty 2

## 2014-05-28 MED ORDER — POLYETHYLENE GLYCOL 3350 17 G PO PACK: 17.0000 g | PACK | Freq: Every day | ORAL | Status: DC | PRN

## 2014-05-28 MED ORDER — INSULIN ASPART 100 UNIT/ML ~~LOC~~ SOLN: 0.0000 [IU] | Freq: Every day | SUBCUTANEOUS | Status: DC

## 2014-05-28 MED ORDER — FAMOTIDINE 40 MG PO TABS
40.0000 mg | ORAL_TABLET | Freq: Every day | ORAL | Status: DC
  Administered 2014-05-28 – 2014-05-30 (×3): 40 mg via ORAL
  Filled 2014-05-28 (×3): qty 1

## 2014-05-28 MED ORDER — ALBUTEROL SULFATE (2.5 MG/3ML) 0.083% IN NEBU: 3.0000 mL | INHALATION_SOLUTION | RESPIRATORY_TRACT | Status: DC | PRN

## 2014-05-28 MED ORDER — IOHEXOL 300 MG/ML  SOLN
50.0000 mL | Freq: Once | INTRAMUSCULAR | Status: AC | PRN
  Administered 2014-05-28: 50 mL via ORAL

## 2014-05-28 MED ORDER — PANTOPRAZOLE SODIUM 40 MG IV SOLR
40.0000 mg | Freq: Once | INTRAVENOUS | Status: AC
  Administered 2014-05-28: 40 mg via INTRAVENOUS
  Filled 2014-05-28: qty 40

## 2014-05-28 MED ORDER — INSULIN ASPART 100 UNIT/ML ~~LOC~~ SOLN: 0.0000 [IU] | Freq: Three times a day (TID) | SUBCUTANEOUS | Status: DC

## 2014-05-28 MED ORDER — IOHEXOL 300 MG/ML  SOLN
100.0000 mL | Freq: Once | INTRAMUSCULAR | Status: AC | PRN
  Administered 2014-05-28: 100 mL via INTRAVENOUS

## 2014-05-28 MED ORDER — SODIUM CHLORIDE 0.9 % IV SOLN
INTRAVENOUS | Status: DC
  Administered 2014-05-29 (×3): via INTRAVENOUS

## 2014-05-28 MED ORDER — ACETAMINOPHEN 650 MG RE SUPP: 650.0000 mg | Freq: Four times a day (QID) | RECTAL | Status: DC | PRN

## 2014-05-28 MED ORDER — ACETAMINOPHEN 325 MG PO TABS: 650.0000 mg | ORAL_TABLET | Freq: Four times a day (QID) | ORAL | Status: DC | PRN

## 2014-05-28 MED ORDER — METRONIDAZOLE 500 MG PO TABS
500.0000 mg | ORAL_TABLET | Freq: Two times a day (BID) | ORAL | Status: DC
  Administered 2014-05-28 – 2014-05-30 (×4): 500 mg via ORAL
  Filled 2014-05-28 (×5): qty 1

## 2014-05-28 NOTE — H&P (Signed)
History and Physical:    Kristy Harris   PPI:[id number] DOB: 26-Sep-1945 DOA: 05/28/2014  Referring physician: Dr. Romero Belling PCP: [name], MD   Chief Complaint: Bloody stools  History of Present Illness:   Kristy Harris is an 69 y.o. female with a PMH of iron deficiency anemia s/p EGD/colonoscopy 11/18/13 which showed chronic active gastritis/H.Pylori +, and severe diverticulosis who presents to the ER with a 5 day history of worsening BRBPR.  Her stools have been watery.  Had doxycycline x 1 earlier this week for treatment of skin infection which she takes intermittently. The patient reports some associated pre-syncope symptoms yesterday, none today.  No associated hematemesis, nausea or vomiting.  No aggravating or alleviating factors.  Although diagnosed with H. Pylori, she says she never had treatment for this because the medications were too expensive.  The patient takes a daily aspirin, last dose was on 05/26/14.  Upon initial evaluation in the ED, the patient was hemodynamically stable.  Hemoglobin was 8.6, similar to usual baseline.  Noted to have maroon colored watery stools by EDP.  Rectal with grossly + blood.  CT abdomen showed no abnormal bowel wall thickening or evidence for bowel obstruction.   ROS:   Constitutional: No fever, + chills;  Appetite normal; No weight loss, + weight gain, + chronic fatigue.  HEENT: No blurry vision, no diplopia, no pharyngitis, no dysphagia CV: No chest pain, no palpitations, no PND, no orthopnea, no edema.  Resp: + chronic SOB with exertion, no cough, no pleuritic pain. GI: No nausea, no vomiting, + diarrhea, no melena, + hematochezia, no constipation, + Mild abdominal pain.  GU: No dysuria, no hematuria, no frequency, no urgency. MSK: no myalgias, + chronic arthritis related arthralgias.  Neuro:  No headache, no focal neurological deficits, no history of seizures.  Psych: No depression, no anxiety.  Endo: No heat intolerance, no  cold intolerance, no polyuria, no polydipsia  Skin: No rashes, + skin lesions.  Heme: No easy bruising.  Travel history: No recent travel.   Past Medical History:   Past Medical History  Diagnosis Date  . Diabetes mellitus   . Morbid obesity   . Recurrent boils     of perineal and buttocks  . Hypertension   . Hyperlipidemia   . COPD (chronic obstructive pulmonary disease)   . Enlarged heart   . Anemia   . Arthritis   . Heart murmur   . Urinary frequency   . Sleep apnea     no cpap used since weight loss  . H. pylori infection   . Diverticulosis   . Hidradenitis suppurativa     Past Surgical History:   Past Surgical History  Procedure Laterality Date  . Pilonidal cyst excision    . Bladder suspension      x 2  . Abcess drainage    . Cardiac catheterization  02/21/2011    Normal coronary arteries, normal EF  . Tonsillectomy  yrs ago  . Abdominal hysterectomy  25 yrs ago  . Irrigation and debridement abscess Right 12/23/2012    Procedure: incision  AND DEBRIDEMENT right buttock infection ;  Surgeon: Shann Medal, MD;  Location: [facility];  Service: General;  Laterality: Right;  . Hydradenitis excision Left 12/08/2013    Procedure: EXCISION HIDRADENITIS AXILLA;  Surgeon: Alphonsa Overall, MD;  Location: [facility];  Service: General;  Laterality: Left;  . Excision hydradenitis labia N/A 12/08/2013    Procedure: EXCISION HIDRADENITIS PUBIC AREA;  Surgeon: Alphonsa Overall, MD;  Location: [facility];  Service: General;  Laterality: N/A;    Social History:   History   Social History  . Marital Status: Widowed    Spouse Name: N/A  . Number of Children: 5  . Years of Education: N/A   Occupational History  . Textile work     Disabled   Social History Main Topics  . Smoking status: Former Smoker -- 1.00 packs/day for 10 years    Types: Cigarettes    Quit date: 03/03/1993  . Smokeless tobacco: Never Used  . Alcohol Use: No  . Drug Use: No  . Sexual Activity: Not on file   Other  Topics Concern  . Not on file   Social History Narrative   Lives with grandson.  Widowed.  Has 2 sons and 3 daughters    Family history:   Family History  Problem Relation Age of Onset  . Heart attack Mother     had multiple health problems  . Lung cancer Father   . Lung cancer Brother     had multiple health problems  . Heart attack Brother     had multiple health problems  . Diabetes Brother     has 1 living brother with multiple health problems  . Diabetes Sister     has 4 living sisters with multiple health problems  . Other Sister     died from gun shot wound to the head    Allergies   Penicillins  Current Medications:   Prior to Admission medications   Medication Sig Start Date End Date Taking? Authorizing Provider  acetaminophen (TYLENOL) 325 MG tablet Take 325-650 mg by mouth every 6 (six) hours as needed for pain.   Yes Historical Provider, MD  albuterol (PROVENTIL HFA;VENTOLIN HFA) 108 (90 BASE) MCG/ACT inhaler Inhale 2 puffs into the lungs every 4 (four) hours as needed for shortness of breath.   Yes Historical Provider, MD  aspirin 81 MG tablet Take 81 mg by mouth every morning.    Yes Historical Provider, MD  atorvastatin (LIPITOR) 20 MG tablet Take 20 mg by mouth every morning.    Yes Historical Provider, MD  Cholecalciferol (VITAMIN D-3) 5000 UNITS TABS Take 5,000 Units by mouth every morning.   Yes Historical Provider, MD  famotidine (PEPCID) 20 MG tablet Take 40 mg by mouth daily.   Yes Historical Provider, MD  HYDROcodone-acetaminophen (NORCO/VICODIN) 5-325 MG per tablet Take 1 tablet by mouth 2 (two) times daily.    Yes Historical Provider, MD  losartan (COZAAR) 100 MG tablet Take 100 mg by mouth daily.   Yes Historical Provider, MD  metFORMIN (GLUCOPHAGE-XR) 500 MG 24 hr tablet Take 500 mg by mouth 2 (two) times daily.  11/02/12  Yes Historical Provider, MD  metoprolol (TOPROL-XL) 100 MG 24 hr tablet Take 100 mg by mouth every morning.    Yes Historical  Provider, MD  verapamil (CALAN) 120 MG tablet Take 120 mg by mouth every morning.    Yes Historical Provider, MD  Bismuth Subsalicylate (SB BISMUTH) 262 MG TABS Use 2 by mouth four times a day for Hpylori along with antibiotics and PPI Patient not taking: Reported on 05/28/2014 12/14/13   Inda Castle, MD  lisinopril (PRINIVIL,ZESTRIL) 40 MG tablet TAKE ONE (1) TABLET EACH DAY Patient not taking: Reported on 05/28/2014 12/21/13   Larey Dresser, MD  Metronid-Tetracyc-Bis Milagros Reap [facility]) MISC Take 1 each by mouth as directed. Patient not taking: Reported on 05/28/2014  12/12/13   Inda Castle, MD  metroNIDAZOLE (FLAGYL) 500 MG tablet Take 1 tablet (500 mg total) by mouth 2 (two) times daily. Patient not taking: Reported on 05/28/2014 12/14/13   Inda Castle, MD  tetracycline (ACHROMYCIN,SUMYCIN) 500 MG capsule Take 1 capsule (500 mg total) by mouth 4 (four) times daily. Patient not taking: Reported on 05/28/2014 12/14/13   Inda Castle, MD    Physical Exam:   Filed Vitals:   05/28/14 1000 05/28/14 1239  BP: 151/68 135/60  Pulse: 98 81  Temp: 98.3 F (36.8 C)   TempSrc: Oral   Resp: 18 18  SpO2: 98% 99%     Physical Exam: Blood pressure 135/60, pulse 81, temperature 98.3 F (36.8 C), temperature source Oral, resp. rate 18, SpO2 99 %. Gen: No acute distress.  Morbidly obese. Head: Normocephalic, atraumatic. Eyes: PERRL, EOMI, sclerae nonicteric. Mouth: Oropharynx clear. Neck: Supple, no thyromegaly, no lymphadenopathy, no jugular venous distention. Chest: Lungs CTAB. CV: Heart sounds are regular.  No M/R/G. Abdomen: Soft, nontender, nondistended with normal active bowel sounds. Rectal: Deferred, done by EDP, grossly heme +. Extremities: Extremities are with trace edema bilaterally. Skin: Warm and dry. Neuro: Alert and oriented times 3; cranial nerves II through XII grossly intact. Psych: Mood and affect normal.   Data Review:    Labs: Basic Metabolic  Panel:  Recent Labs Lab 05/28/14 1111  NA 138  K 4.4  CL 102  CO2 27  GLUCOSE 129*  BUN 14  CREATININE 0.69  CALCIUM 8.7   Liver Function Tests:  Recent Labs Lab 05/28/14 1111  AST 30  ALT 21  ALKPHOS 104  BILITOT 0.8  PROT 7.5  ALBUMIN 3.4*   CBC:  Recent Labs Lab 05/28/14 1111  WBC 6.1  NEUTROABS 3.3  HGB 8.6*  HCT 28.6*  MCV 77.7*  PLT 257   Radiographic Studies: Ct Abdomen Pelvis W Contrast  05/28/2014   CLINICAL DATA:  Patient with mid abdominal pain and rectal bleeding for 1 week.  EXAM: CT ABDOMEN AND PELVIS WITH CONTRAST  TECHNIQUE: Multidetector CT imaging of the abdomen and pelvis was performed using the standard protocol following bolus administration of intravenous contrast.  CONTRAST:  74mL OMNIPAQUE IOHEXOL 300 MG/ML SOLN, 133mL OMNIPAQUE IOHEXOL 300 MG/ML SOLN  COMPARISON:  MRI pelvis 11/09/2012  FINDINGS: Lower chest: No consolidative or nodular pulmonary opacities. Normal heart size.  Hepatobiliary: Liver is normal in size and contour without focal hepatic lesion identified. Gallbladder is unremarkable. No intrahepatic or extrahepatic biliary ductal dilatation.  Pancreas: Unremarkable  Spleen: Multiple indeterminate sub cm splenic hypodensities, too small to characterize.  Adrenals/Urinary Tract: Normal adrenal glands. Kidneys are symmetric in size and enhance with contrast. No hydronephrosis. There a few foci of gas anteriorly within the urinary bladder.  Stomach/Bowel: No abnormal bowel wall thickening or evidence for bowel obstruction. Normal appendix.  Vascular/Lymphatic: Normal caliber abdominal aorta. No retroperitoneal lymphadenopathy. Persistent prominent bilateral inguinal lymph nodes. Bilateral external iliac lymphadenopathy.  Other: There is soft tissue thickening and stranding involving the left labia (image 82; series 2). Re- demonstrated left adnexal cystic mass, favored represent a hydrosalpinx. Post hysterectomy.  Musculoskeletal: Multilevel  degenerative changes of the lower thoracic and lumbar spine. Patchy areas of sclerosis involving the lower thoracic and lumbar spine likely secondary to a combination of degenerative change, osteoporosis and obesity. No definite discrete lytic or sclerotic lesions identified.  IMPRESSION: Nonspecific soft tissue thickening and stranding involving the left labia. Recommend clinical correlation.  Few foci  of gas within the urinary bladder, recommend correlation for prior instrumentation.  Probable left adnexal hydrosalpinx.  No abnormal bowel wall thickening or evidence for bowel obstruction.  Persistent bilateral inguinal adenopathy.   Electronically Signed   By: Lovey Newcomer M.D.   On: 05/28/2014 14:34     Assessment/Plan:   Principal Problem:   Lower GI bleed with iron deficiency anemia - Probable diverticular given colonoscopy findings.  No elevation of BUN to suggest upper GI source. - Check hemoglobin Q 8 hours. - Hold ASA. - Continue Pepcid. - Dr. Kelby Fam office notified by Dalia Heading who agreed to document who he spoke to once they call back.  Active Problems:   H. Pylori infection - Was never treated.  Initiate therapy with Flagyl, tetracycline and Bismuth.    Hypertension - Continue Cozaar, Metoprolol and Verapamil.    Hyperlipidemia - Continue Lipitor.    Diabetes mellitus - Continue Metformin.  Add moderate scale SSI.    Abscess of skin and subcutaneous tissue - H/O hidradenitis.   These abscesses are typically sterile.    DVT prophylaxis - SCDs only given GI bleeding.  Code Status: Full. Family Communication: Multiple family updated in room. Disposition Plan: Home when stable.  Time spent: 1 hour.  RAMA,CHRISTINA [facility] Pager [phone] Cell: [phone]   If 7PM-7AM, please contact night-coverage [email or web address] Password [facility] 05/28/2014, 3:56 PM

## 2014-05-28 NOTE — Progress Notes (Signed)
Re: Boils.  Pt with multiple boils on buttocks which RN observed. Daughter-in-law, an Therapist, sports for [employer] applied dressing to pubic area before RN able to inspect prior to shift change.  Pt/family reports that pt had been admitted previously for surgical tx of boils and that pubic boils "have a spot that is still draining."  Gave report to oncoming nurse who will further assess pt's skin.

## 2014-05-28 NOTE — ED Provider Notes (Signed)
CSN: [id number]     Arrival date & time 05/28/14  8185 History   First MD Initiated Contact with Patient 05/28/14 1012     Chief Complaint  Patient presents with  . Rectal Bleeding     (Consider location/radiation/quality/duration/timing/severity/associated sxs/prior Treatment) HPI  The patient is a 69 y/o female with a negative (per patient) colonoscopy 6 months ago who presents to the emergency department with bleeding per rectum. She states that this started approximately a week ago with moderate amounts of blood in her stool. The volume has increased and she is now passing blood and clots apart from bowel movements. She is having mild lower abdomen discomfort which does not radiate. She also reports pain at her rectum. She had a "virus" for the week prior to this bleeding with abdominal discomfort and nausea. She has not vomited. She feels that she may have had a fever earlier in the week but never checked a temperature. She admits to some shortness of breath and diaphoresis but states that this is her baseline due to her many medical conditions. She has never had bleeding per rectum before and denies a history of hemorrhoids. She denies chills/rigors, weight loss, headache, chest pain, palpitations, pain with inspiration, dysuria, hematuria, numbness, tingling. She endorses fatigue and generalized weakness and states she has some of this as well at baseline due to anemia.  The patient is a former smoker and denies alcohol and other substances.  Past Medical History  Diagnosis Date  . Diabetes mellitus   . Morbid obesity   . Recurrent boils     of perineal and buttocks  . Hypertension   . Hyperlipidemia   . Hidradenitis   . COPD (chronic obstructive pulmonary disease)   . Enlarged heart   . Anemia   . Arthritis   . Heart murmur   . Urinary frequency   . Sleep apnea     no cpap used since weight loss   Past Surgical History  Procedure Laterality Date  . Pilonidal cyst excision     . Bladder suspension      x 2  . Abcess drainage    . Cardiac catheterization  02/21/2011    Normal coronary arteries, normal EF  . Tonsillectomy  yrs ago  . Abdominal hysterectomy  25 yrs ago  . Irrigation and debridement abscess Right 12/23/2012    Procedure: incision  AND DEBRIDEMENT right buttock infection ;  Surgeon: Shann Medal, MD;  Location: [facility];  Service: General;  Laterality: Right;  . Hydradenitis excision Left 12/08/2013    Procedure: EXCISION HIDRADENITIS AXILLA;  Surgeon: Alphonsa Overall, MD;  Location: [facility];  Service: General;  Laterality: Left;  . Excision hydradenitis labia N/A 12/08/2013    Procedure: EXCISION HIDRADENITIS PUBIC AREA;  Surgeon: Alphonsa Overall, MD;  Location: [facility];  Service: General;  Laterality: N/A;   Family History  Problem Relation Age of Onset  . Heart attack Mother     had multiple health problems  . Lung cancer Father   . Lung cancer Brother     had multiple health problems  . Heart attack Brother     had multiple health problems  . Diabetes Brother     has 1 living brother with multiple health problems  . Diabetes Sister     has 4 living sisters with multiple health problems  . Other Sister     died from gun shot wound to the head   History  Substance Use Topics  .  Smoking status: Former Smoker -- 1.00 packs/day for 10 years    Types: Cigarettes    Quit date: 03/03/1993  . Smokeless tobacco: Never Used  . Alcohol Use: No   OB History    No data available     Review of Systems  All other systems negative except as documented in the HPI. All pertinent positives and negatives as reviewed in the HPI.  Allergies  Penicillins  Home Medications   Prior to Admission medications   Medication Sig Start Date End Date Taking? Authorizing Provider  acetaminophen (TYLENOL) 325 MG tablet Take 325-650 mg by mouth every 6 (six) hours as needed for pain.   Yes Historical Provider, MD  albuterol (PROVENTIL HFA;VENTOLIN HFA) 108 (90  BASE) MCG/ACT inhaler Inhale 2 puffs into the lungs every 4 (four) hours as needed for shortness of breath.   Yes Historical Provider, MD  aspirin 81 MG tablet Take 81 mg by mouth every morning.    Yes Historical Provider, MD  atorvastatin (LIPITOR) 20 MG tablet Take 20 mg by mouth every morning.    Yes Historical Provider, MD  Cholecalciferol (VITAMIN D-3) 5000 UNITS TABS Take 5,000 Units by mouth every morning.   Yes Historical Provider, MD  famotidine (PEPCID) 20 MG tablet Take 40 mg by mouth daily.   Yes Historical Provider, MD  HYDROcodone-acetaminophen (NORCO/VICODIN) 5-325 MG per tablet Take 1 tablet by mouth 2 (two) times daily.    Yes Historical Provider, MD  losartan (COZAAR) 100 MG tablet Take 100 mg by mouth daily.   Yes Historical Provider, MD  metFORMIN (GLUCOPHAGE-XR) 500 MG 24 hr tablet Take 500 mg by mouth 2 (two) times daily.  11/02/12  Yes Historical Provider, MD  metoprolol (TOPROL-XL) 100 MG 24 hr tablet Take 100 mg by mouth every morning.    Yes Historical Provider, MD  verapamil (CALAN) 120 MG tablet Take 120 mg by mouth every morning.    Yes Historical Provider, MD  Bismuth Subsalicylate (SB BISMUTH) 262 MG TABS Use 2 by mouth four times a day for Hpylori along with antibiotics and PPI Patient not taking: Reported on 05/28/2014 12/14/13   Inda Castle, MD  lisinopril (PRINIVIL,ZESTRIL) 40 MG tablet TAKE ONE (1) TABLET EACH DAY Patient not taking: Reported on 05/28/2014 12/21/13   Larey Dresser, MD  Metronid-Tetracyc-Bis Milagros Reap [facility]) MISC Take 1 each by mouth as directed. Patient not taking: Reported on 05/28/2014 12/12/13   Inda Castle, MD  metroNIDAZOLE (FLAGYL) 500 MG tablet Take 1 tablet (500 mg total) by mouth 2 (two) times daily. Patient not taking: Reported on 05/28/2014 12/14/13   Inda Castle, MD  tetracycline (ACHROMYCIN,SUMYCIN) 500 MG capsule Take 1 capsule (500 mg total) by mouth 4 (four) times daily. Patient not taking: Reported on 05/28/2014 12/14/13    Inda Castle, MD   BP 151/68 mmHg  Pulse 98  Temp(Src) 98.3 F (36.8 C) (Oral)  Resp 18  SpO2 98% Physical Exam  Constitutional: She is oriented to person, place, and time. She appears well-developed and well-nourished. No distress.  HENT:  Head: Normocephalic and atraumatic.  Eyes: EOM are normal. Pupils are equal, round, and reactive to light.  Neck: Normal range of motion. Neck supple. No tracheal deviation present. No thyromegaly present.  Cardiovascular: Normal rate, regular rhythm, normal heart sounds and intact distal pulses.  Exam reveals no gallop and no friction rub.   No murmur heard. Pulmonary/Chest: Breath sounds normal. No tachypnea. No respiratory distress.  Abdominal: Soft. Bowel  sounds are normal. She exhibits no distension. There is no tenderness. There is no rebound and no guarding.  Genitourinary:     No external hemorrhoids, rectal lacerations, fissures, or erythema  Musculoskeletal: Normal range of motion. She exhibits no edema.  Lymphadenopathy:    She has no cervical adenopathy.  Neurological: She is alert and oriented to person, place, and time. No cranial nerve deficit. She exhibits normal muscle tone. Coordination normal.  Skin: Skin is warm and dry. No rash noted. She is not diaphoretic. No erythema.  Psychiatric: She has a normal mood and affect.    ED Course  Procedures (including critical care time) Labs Review Labs Reviewed  COMPREHENSIVE METABOLIC PANEL  CBC WITH DIFFERENTIAL/PLATELET  URINALYSIS, ROUTINE W REFLEX MICROSCOPIC  POC OCCULT BLOOD, ED    Imaging Review Ct Abdomen Pelvis W Contrast  05/28/2014   CLINICAL DATA:  Patient with mid abdominal pain and rectal bleeding for 1 week.  EXAM: CT ABDOMEN AND PELVIS WITH CONTRAST  TECHNIQUE: Multidetector CT imaging of the abdomen and pelvis was performed using the standard protocol following bolus administration of intravenous contrast.  CONTRAST:  26mL OMNIPAQUE IOHEXOL 300 MG/ML SOLN,  168mL OMNIPAQUE IOHEXOL 300 MG/ML SOLN  COMPARISON:  MRI pelvis 11/09/2012  FINDINGS: Lower chest: No consolidative or nodular pulmonary opacities. Normal heart size.  Hepatobiliary: Liver is normal in size and contour without focal hepatic lesion identified. Gallbladder is unremarkable. No intrahepatic or extrahepatic biliary ductal dilatation.  Pancreas: Unremarkable  Spleen: Multiple indeterminate sub cm splenic hypodensities, too small to characterize.  Adrenals/Urinary Tract: Normal adrenal glands. Kidneys are symmetric in size and enhance with contrast. No hydronephrosis. There a few foci of gas anteriorly within the urinary bladder.  Stomach/Bowel: No abnormal bowel wall thickening or evidence for bowel obstruction. Normal appendix.  Vascular/Lymphatic: Normal caliber abdominal aorta. No retroperitoneal lymphadenopathy. Persistent prominent bilateral inguinal lymph nodes. Bilateral external iliac lymphadenopathy.  Other: There is soft tissue thickening and stranding involving the left labia (image 82; series 2). Re- demonstrated left adnexal cystic mass, favored represent a hydrosalpinx. Post hysterectomy.  Musculoskeletal: Multilevel degenerative changes of the lower thoracic and lumbar spine. Patchy areas of sclerosis involving the lower thoracic and lumbar spine likely secondary to a combination of degenerative change, osteoporosis and obesity. No definite discrete lytic or sclerotic lesions identified.  IMPRESSION: Nonspecific soft tissue thickening and stranding involving the left labia. Recommend clinical correlation.  Few foci of gas within the urinary bladder, recommend correlation for prior instrumentation.  Probable left adnexal hydrosalpinx.  No abnormal bowel wall thickening or evidence for bowel obstruction.  Persistent bilateral inguinal adenopathy.   Electronically Signed   By: Lovey Newcomer M.D.   On: 05/28/2014 14:34    The patient will be admitted to the hospital. I spoke with Dr. Collene Mares of  GI.      Dalia Heading, PA-C 05/30/14 [id number]  Serita Grit, MD 05/30/14 [phone]

## 2014-05-28 NOTE — ED Notes (Signed)
Patient transported to CT 

## 2014-05-28 NOTE — ED Notes (Signed)
Per family report: pt reports bleeding when having BM for the past week.  Bleeding became worse last night. Pt reports abd pain along the middle of her abd that radiates down towards her pelvis.  When pt lays down, there is a mass that pops [place]. Pt describes the pain as a "griping pain" rating it 5/10.  Pt also had a viral infection this past week. Pt reports SOB with exertion but that is not symptom is not new. Pt a/o x4. Skin warm and dry. Pt ambulatory with a cane.

## 2014-05-29 ENCOUNTER — Encounter (HOSPITAL_COMMUNITY): Payer: Self-pay | Admitting: *Deleted

## 2014-05-29 DIAGNOSIS — K5731 Diverticulosis of large intestine without perforation or abscess with bleeding: Secondary | ICD-10-CM

## 2014-05-29 LAB — GLUCOSE, CAPILLARY
GLUCOSE-CAPILLARY: 101 mg/dL — AB (ref 70–99)
GLUCOSE-CAPILLARY: 122 mg/dL — AB (ref 70–99)
Glucose-Capillary: 131 mg/dL — ABNORMAL HIGH (ref 70–99)
Glucose-Capillary: 97 mg/dL (ref 70–99)

## 2014-05-29 LAB — HEMOGLOBIN
Hemoglobin: 7.4 g/dL — ABNORMAL LOW (ref 12.0–15.0)
Hemoglobin: 8 g/dL — ABNORMAL LOW (ref 12.0–15.0)
Hemoglobin: 8.2 g/dL — ABNORMAL LOW (ref 12.0–15.0)

## 2014-05-29 MED ORDER — FERROUS SULFATE 325 (65 FE) MG PO TABS
325.0000 mg | ORAL_TABLET | ORAL | Status: DC
  Administered 2014-05-30: 325 mg via ORAL
  Filled 2014-05-29 (×2): qty 1

## 2014-05-29 MED ORDER — PANTOPRAZOLE SODIUM 40 MG PO TBEC
40.0000 mg | DELAYED_RELEASE_TABLET | Freq: Two times a day (BID) | ORAL | Status: DC
  Administered 2014-05-29 – 2014-05-30 (×3): 40 mg via ORAL
  Filled 2014-05-29 (×4): qty 1

## 2014-05-29 MED ORDER — CETYLPYRIDINIUM CHLORIDE 0.05 % MT LIQD
7.0000 mL | Freq: Two times a day (BID) | OROMUCOSAL | Status: DC
  Administered 2014-05-29: 7 mL via OROMUCOSAL

## 2014-05-29 NOTE — Progress Notes (Signed)
Pt had hgb of 7.4.  Results called to Dr. Rockne Menghini.  No noted bleeding from rectum and no bowel movements from pt.  Dr. Rockne Menghini stated to just monitor pt for now and call back if pt starts having bloody bowel movements.

## 2014-05-29 NOTE — Consult Note (Signed)
Referring Provider:  [facility] Primary Care Physician:  [name], MD Primary Gastroenterologist:  Dr. Deatra Ina  Reason for Consultation:   Fabienne Bruns bleed     HPI: Kristy Harris is a 69 y.o. female admitted last pm with [facility]. Kristy Harris has a history of diabetes, obesity, hypertension, hyperlipidemia, COPD, anemia, sleep apnea, diverticulosis, and hidradenitis. She was by Dr. Deatra Ina in July 2015 for evaluation of anemia. The patient states she has been anemic on and off for all of her life. Her hemoglobin typically ran in the 9-10 range with an MCV of 75. She was scheduled for colonoscopy and upper endoscopy which she had performed in September 2015. She was found to have gastritis and biopsies found her to be H. pylori positive. Colonoscopy revealed diverticular disease. Patient does not remember ever being given the results of her testing. She says she was unaware that medications were called in for H. pylori and thus did not take them. She reports that for the week prior to admission she had been constipated in 5 days prior to admission strained to pass hard nugget like stools. At that time she had blood on the toilet tissue. The next 2 days her stools were formed but she continued to have blood spotting on the toilet tissue. Saturday she began to have dark red and burgundy stools and had several bowel movements through the day. Her stools became loose. She came to the emergency room yesterday because she was beginning to feel lightheaded. She had no associated hematemesis, nausea, or vomiting. Rectal examination the ER was grossly positive for blood. Since being admitted, she had one bowel movement with dark red blood yesterday but has not had a bowel movement since. She has a dull ache in the lower abdomen, but has no fever or chills she denies dysuria.   Past Medical History  Diagnosis Date  . Diabetes mellitus   . Morbid obesity   . Recurrent boils     of perineal and buttocks  .  Hypertension   . Hyperlipidemia   . COPD (chronic obstructive pulmonary disease)   . Enlarged heart   . Anemia   . Arthritis   . Heart murmur   . Urinary frequency   . Sleep apnea     no cpap used since weight loss  . H. pylori infection   . Diverticulosis   . Hidradenitis suppurativa     Past Surgical History  Procedure Laterality Date  . Pilonidal cyst excision    . Bladder suspension      x 2  . Abcess drainage    . Cardiac catheterization  02/21/2011    Normal coronary arteries, normal EF  . Tonsillectomy  yrs ago  . Abdominal hysterectomy  25 yrs ago  . Irrigation and debridement abscess Right 12/23/2012    Procedure: incision  AND DEBRIDEMENT right buttock infection ;  Surgeon: Shann Medal, MD;  Location: [facility];  Service: General;  Laterality: Right;  . Hydradenitis excision Left 12/08/2013    Procedure: EXCISION HIDRADENITIS AXILLA;  Surgeon: Alphonsa Overall, MD;  Location: [facility];  Service: General;  Laterality: Left;  . Excision hydradenitis labia N/A 12/08/2013    Procedure: EXCISION HIDRADENITIS PUBIC AREA;  Surgeon: Alphonsa Overall, MD;  Location: [facility];  Service: General;  Laterality: N/A;    Prior to Admission medications   Medication Sig Start Date End Date Taking? Authorizing Provider  acetaminophen (TYLENOL) 325 MG tablet Take 325-650 mg by mouth every 6 (six) hours  as needed for pain.   Yes Historical Provider, MD  albuterol (PROVENTIL HFA;VENTOLIN HFA) 108 (90 BASE) MCG/ACT inhaler Inhale 2 puffs into the lungs every 4 (four) hours as needed for shortness of breath.   Yes Historical Provider, MD  aspirin 81 MG tablet Take 81 mg by mouth every morning.    Yes Historical Provider, MD  atorvastatin (LIPITOR) 20 MG tablet Take 20 mg by mouth every morning.    Yes Historical Provider, MD  Cholecalciferol (VITAMIN D-3) 5000 UNITS TABS Take 5,000 Units by mouth every morning.   Yes Historical Provider, MD  famotidine (PEPCID) 20 MG tablet Take 40 mg by mouth daily.    Yes Historical Provider, MD  HYDROcodone-acetaminophen (NORCO/VICODIN) 5-325 MG per tablet Take 1 tablet by mouth 2 (two) times daily.    Yes Historical Provider, MD  losartan (COZAAR) 100 MG tablet Take 100 mg by mouth daily.   Yes Historical Provider, MD  metFORMIN (GLUCOPHAGE-XR) 500 MG 24 hr tablet Take 500 mg by mouth 2 (two) times daily.  11/02/12  Yes Historical Provider, MD  metoprolol (TOPROL-XL) 100 MG 24 hr tablet Take 100 mg by mouth every morning.    Yes Historical Provider, MD  verapamil (CALAN) 120 MG tablet Take 120 mg by mouth every morning.    Yes Historical Provider, MD  Bismuth Subsalicylate (SB BISMUTH) 262 MG TABS Use 2 by mouth four times a day for Hpylori along with antibiotics and PPI Patient not taking: Reported on 05/28/2014 12/14/13   Inda Castle, MD  lisinopril (PRINIVIL,ZESTRIL) 40 MG tablet TAKE ONE (1) TABLET EACH DAY Patient not taking: Reported on 05/28/2014 12/21/13   Larey Dresser, MD  Metronid-Tetracyc-Bis Milag[facility] Re[facility]) MISC Take 1 each by mouth as directed. Patient not taking: Reported on 05/28/2014 12/12/13   Inda Castle, MD  metroNIDAZOLE (FLAGYL) 500 MG tablet Take 1 tablet (500 mg total) by mouth 2 (two) times daily. Patient not taking: Reported on 05/28/2014 12/14/13   Inda Castle, MD  tetracycline (ACHROMYCIN,SUMYCIN) 500 MG capsule Take 1 capsule (500 mg total) by mouth 4 (four) times daily. Patient not taking: Reported on 05/28/2014 12/14/13   Inda Castle, MD    Current Facility-Administered Medications  Medication Dose Route Frequency Provider Last Rate Last Dose  . 0.9 %  sodium chloride infusion   Intravenous Continuous Venetia Maxon Rama, MD 75 mL/hr at 05/29/14 0038    . acetaminophen (TYLENOL) tablet 650 mg  650 mg Oral Q6H PRN Venetia Maxon Rama, MD       Or  . acetaminophen (TYLENOL) suppository 650 mg  650 mg Rectal Q6H PRN Christina P Rama, MD      . albuterol (PROVENTIL) (2.5 MG/3ML) 0.083% nebulizer solution 3 mL  3 mL  Inhalation Q4H PRN Christina P Rama, MD      . alum & mag hydroxide-simeth (MAALOX/MYLANTA) 200-200-20 MG/5ML suspension 30 mL  30 mL Oral Q6H PRN Christina P Rama, MD      . antiseptic oral rinse (CPC / CETYLPYRIDINIUM CHLORIDE 0.05%) solution 7 mL  7 mL Mouth Rinse BID Christina P Rama, MD      . atorvastatin (LIPITOR) tablet 20 mg  20 mg Oral q morning - 10a Christina P Rama, MD      . bismuth subsalicylate (PEPTO BISMOL) chewable tablet 524 mg  524 mg Oral BID Venetia Maxon Rama, MD   524 mg at 05/28/14 2130  . cholecalciferol (VITAMIN D) tablet 5,000 Units  5,000 Units Oral q  morning - 10a Christina P Rama, MD      . famotidine (PEPCID) tablet 40 mg  40 mg Oral Daily Venetia Maxon Rama, MD   40 mg at 05/28/14 1833  . HYDROcodone-acetaminophen (NORCO/VICODIN) 5-325 MG per tablet 1 tablet  1 tablet Oral Q6H PRN Venetia Maxon Rama, MD   1 tablet at 05/29/14 0037  . insulin aspart (novoLOG) injection 0-15 Units  0-15 Units Subcutaneous TID WC Venetia Maxon Rama, MD   0 Units at 05/28/14 1840  . insulin aspart (novoLOG) injection 0-5 Units  0-5 Units Subcutaneous QHS Venetia Maxon Rama, MD   0 Units at 05/28/14 2200  . losartan (COZAAR) tablet 100 mg  100 mg Oral Daily Venetia Maxon Rama, MD   100 mg at 05/28/14 1901  . metFORMIN (GLUCOPHAGE-XR) 24 hr tablet 500 mg  500 mg Oral BID WC Venetia Maxon Rama, MD   500 mg at 05/28/14 1831  . metoprolol succinate (TOPROL-XL) 24 hr tablet 100 mg  100 mg Oral Daily Christina P Rama, MD      . metroNIDAZOLE (FLAGYL) tablet 500 mg  500 mg Oral BID Venetia Maxon Rama, MD   500 mg at 05/28/14 2130  . ondansetron (ZOFRAN) tablet 4 mg  4 mg Oral Q6H PRN Christina P Rama, MD       Or  . ondansetron (ZOFRAN) injection 4 mg  4 mg Intravenous Q6H PRN Christina P Rama, MD      . polyethylene glycol (MIRALAX / GLYCOLAX) packet 17 g  17 g Oral Daily PRN Venetia Maxon Rama, MD      . tetracycline (ACHROMYCIN,SUMYCIN) capsule 500 mg  500 mg Oral QID Venetia Maxon Rama, MD   500 mg at  05/28/14 2130  . verapamil (CALAN) tablet 120 mg  120 mg Oral Daily Venetia Maxon Rama, MD        Allergies as of 05/28/2014 - Review Complete 05/28/2014  Allergen Reaction Noted  . Penicillins Rash 01/17/2011    Family History  Problem Relation Age of Onset  . Heart attack Mother     had multiple health problems  . Lung cancer Father   . Lung cancer Brother     had multiple health problems  . Heart attack Brother     had multiple health problems  . Diabetes Brother     has 1 living brother with multiple health problems  . Diabetes Sister     has 4 living sisters with multiple health problems  . Other Sister     died from gun shot wound to the head    History   Social History  . Marital Status: Widowed    Spouse Name: N/A  . Number of Children: 5  . Years of Education: N/A   Occupational History  . Textile work     Disabled   Social History Main Topics  . Smoking status: Former Smoker -- 1.00 packs/day for 10 years    Types: Cigarettes    Quit date: 03/03/1993  . Smokeless tobacco: Never Used  . Alcohol Use: No  . Drug Use: No  . Sexual Activity: Not on file   Other Topics Concern  . Not on file   Social History Narrative   Lives with grandson.  Widowed.  Has 2 sons and 3 daughters    Review of Systems: Gen: Denies any fever, sweats, anorexia, fatigue, weakness, malaise, weight loss, and sleep disorder. +chills CV: Denies chest pain, angina, palpitations, syncope, orthopnea, PND, peripheral edema, and  claudication. Resp:+ SOB with exertion GI: Denies vomiting blood, jaundice, and fecal incontinence.   Denies dysphagia or odynophagia.+hematochezia GU : Denies urinary burning, blood in urine, urinary frequency, urinary hesitancy, nocturnal urination, and urinary incontinence. MS: + joint pain Derm:+ skin lesions perineal area and sacrum Psych: Denies depression, anxiety, memory loss, suicidal ideation, hallucinations, paranoia, and confusion. Heme: Denies  bruising, bleeding, and enlarged lymph nodes. Neuro:  Denies any headaches, dizziness, paresthesias. Endo:  Denies any problems with DM, thyroid, adrenal function.  Physical Exam: Vital signs in last 24 hours: Temp:  [98.1 F (36.7 C)-98.6 F (37 C)] 98.6 F (37 C) (03/28 0629) Pulse Rate:  [81-98] 97 (03/28 0629) Resp:  [18-20] 20 (03/28 0629) BP: (135-168)/(50-68) 168/66 mmHg (03/28 0629) SpO2:  [92 %-99 %] 92 % (03/28 0629) Weight:  [283 lb 3.2 oz (128.459 kg)] 283 lb 3.2 oz (128.459 kg) (03/27 1642) Last BM Date: 05/28/14 General:   Alert, obese,  pleasant and cooperative in NAD Head:  Normocephalic and atraumatic. Eyes:  Sclera clear, no icterus.   Conjunctiva pink. Ears:  Normal auditory acuity. Nose:  No deformity, discharge,  or lesions. Mouth:  No deformity or lesions.   Neck:  Supple; no masses or thyromegaly. Lungs:  Clear throughout to auscultation.   No wheezes, crackles, or rhonchi.  Heart:  Regular rate and rhythm; no murmurs, clicks, rubs,  or gallops. Abdomen:  Soft,nontender, BS active,nonpalp mass or hsm.   Rectal:  Deferred as was gross;y positive for blood in ED Msk:  Symmetrical without gross deformities. . Pulses:  Normal pulses noted. Extremities:  Without clubbing or edema. Neurologic:  Alert and  oriented x4;  grossly normal neurologically. Skin:  Warm, dry Psych:  Alert and cooperative. Normal mood and affect.  Intake/Output from previous day: 03/27 0701 - 03/28 0700 In: 957.5 [P.O.:480; I.V.:477.5] Out: 1450 [Urine:1450] Intake/Output this shift: Total I/O In: -  Out: 200 [Urine:200]  Lab Results:  Recent Labs  05/28/14 1111 05/28/14 1632 05/29/14 0023  WBC 6.1  --   --   HGB 8.6* 8.0* 8.2*  HCT 28.6*  --   --   PLT 257  --   --    BMET  Recent Labs  05/28/14 1111  NA 138  K 4.4  CL 102  CO2 27  GLUCOSE 129*  BUN 14  CREATININE 0.69  CALCIUM 8.7   LFT  Recent Labs  05/28/14 1111  PROT 7.5  ALBUMIN 3.4*  AST 30    ALT 21  ALKPHOS 104  BILITOT 0.8      Studies/Results: Ct Abdomen Pelvis W Contrast  05/28/2014   CLINICAL DATA:  Patient with mid abdominal pain and rectal bleeding for 1 week.  EXAM: CT ABDOMEN AND PELVIS WITH CONTRAST  TECHNIQUE: Multidetector CT imaging of the abdomen and pelvis was performed using the standard protocol following bolus administration of intravenous contrast.  CONTRAST:  48mL OMNIPAQUE IOHEXOL 300 MG/ML SOLN, 169mL OMNIPAQUE IOHEXOL 300 MG/ML SOLN  COMPARISON:  MRI pelvis 11/09/2012  FINDINGS: Lower chest: No consolidative or nodular pulmonary opacities. Normal heart size.  Hepatobiliary: Liver is normal in size and contour without focal hepatic lesion identified. Gallbladder is unremarkable. No intrahepatic or extrahepatic biliary ductal dilatation.  Pancreas: Unremarkable  Spleen: Multiple indeterminate sub cm splenic hypodensities, too small to characterize.  Adrenals/Urinary Tract: Normal adrenal glands. Kidneys are symmetric in size and enhance with contrast. No hydronephrosis. There a few foci of gas anteriorly within the urinary bladder.  Stomach/Bowel: No abnormal  bowel wall thickening or evidence for bowel obstruction. Normal appendix.  Vascular/Lymphatic: Normal caliber abdominal aorta. No retroperitoneal lymphadenopathy. Persistent prominent bilateral inguinal lymph nodes. Bilateral external iliac lymphadenopathy.  Other: There is soft tissue thickening and stranding involving the left labia (image 82; series 2). Re- demonstrated left adnexal cystic mass, favored represent a hydrosalpinx. Post hysterectomy.  Musculoskeletal: Multilevel degenerative changes of the lower thoracic and lumbar spine. Patchy areas of sclerosis involving the lower thoracic and lumbar spine likely secondary to a combination of degenerative change, osteoporosis and obesity. No definite discrete lytic or sclerotic lesions identified.  IMPRESSION: Nonspecific soft tissue thickening and stranding  involving the left labia. Recommend clinical correlation.  Few foci of gas within the urinary bladder, recommend correlation for prior instrumentation.  Probable left adnexal hydrosalpinx.  No abnormal bowel wall thickening or evidence for bowel obstruction.  Persistent bilateral inguinal adenopathy.   Electronically Signed   By: Lovey Newcomer M.D.   On: 05/28/2014 14:34    Endoscopies: ENDOSCOPIC IMPRESSION: 1. gastritis findings do not explain chronic iron deficiency anemia RECOMMENDATIONS: Await biopsy results Pepcid 40mg  qd REPEAT EXAM: eSigned: Inda Castle, MD 11/18/2013 4:05 PM  Colonoscopy 11/18/13: ENDOSCOPIC IMPRESSION: 1. There was severe diverticulosis noted in the sigmoid colon 2. The colon was otherwise normal RECOMMENDATIONS: Colonoscopy 10 years      IMPRESSION/PLAN:  #1. Lower GI bleed. Hemoglobin appears to have stabilized this morning. Recent colonoscopy with extensive diverticular disease, and etiology of her bleed at this time is likely diverticular. (See BUN 14). Continue clear liquids this morning, may be able to advance to full liquids later today. Patient is requesting dietary consult to review low residue diet as well as high fiber diet that she will eventually be on.  #2. H. pylori positivity. Patient had been started on Flagyl, tetracycline, and bismuth. Will increase PPI to twice a day.  #3. Hypertension. Currently on verapamil, metoprolol, and Cozaar.    Hvozdovic, Deloris Ping 05/29/2014,  Pager [phone]  [facility] Attending  I have also seen and assessed the patient and agree with the advanced practitioner's assessment and plan.  Gatha Mayer, MD, [facility] [phone] (pager) 05/29/2014 5:49 PM

## 2014-05-29 NOTE — Progress Notes (Signed)
Pt has hx of sleep apnea. I inquired as to whether or not she uses a Cpap at home, and she stated that she has one but she has not used it in about 2 yrs. I advised her that is her oxygen level dropped while sleeping here we may have to put one on her. Pt agreed.

## 2014-05-29 NOTE — Progress Notes (Signed)
Pt had gauze dressing to her perineum and sacral area. The gauze were covering multiple and various cysts in different stages of healing. Pt states that she either she herself or her daughter in law change them for her.

## 2014-05-29 NOTE — Progress Notes (Addendum)
Progress Note   ALIJAH HYDE BDZ:[id number] DOB: 1946/02/20 DOA: 05/28/2014 PCP: [name], MD   Brief Narrative:   Kristy Harris is an 69 y.o. female with a PMH of iron deficiency anemia s/p EGD/colonoscopy 11/18/13 which showed chronic active gastritis/H.Pylori +, and severe diverticulosis who was admitted 05/28/14 with a chief complaint of bright red blood per rectum in the setting of daily aspirin intake. Upon initial evaluation in the ED, the patient was hemodynamically stable. Hemoglobin was 8.6, similar to usual baseline. Noted to have maroon colored watery stools by EDP. Rectal with grossly + blood. CT abdomen showed no abnormal bowel wall thickening or evidence for bowel obstruction.   Assessment/Plan:   Principal Problem:  Lower GI bleed with iron deficiency anemia - s/p EGD/colonoscopy 11/18/13 which showed chronic active gastritis/H.Pylori +, and severe diverticulosis.  - Probable diverticular given colonoscopy findings. No elevation of BUN to suggest upper GI source. - Hemoglobin stable overnight. - Continue to hold ASA. - Change pepcid to Protonix BID. - C. difficile PCR negative. - GI consulted. - Start iron replacement therapy.  Active Problems:  H. Pylori infection - Was never treated. Therapy with Flagyl, tetracycline and Bismuth initiated.   Hypertension - Continue Cozaar, Metoprolol and Verapamil.   Hyperlipidemia - Continue Lipitor.   Diabetes mellitus - Continue Metformin and moderate scale SSI. CBGs 131-144.   Abscess of skin and subcutaneous tissue - H/O hidradenitis. These abscesses are typically sterile.   DVT prophylaxis - SCDs only given GI bleeding.  Code Status: Full. Family Communication: Multiple family updated on admission, no family present today. Disposition Plan: Home 05/30/14 if hemoglobin stable and no plans for further endoscopic evaluation.   IV Access:    Peripheral IV   Procedures and  diagnostic studies:   Ct Abdomen Pelvis W Contrast 05/28/2014 : Nonspecific soft tissue thickening and stranding involving the left labia. Recommend clinical correlation.  Few foci of gas within the urinary bladder, recommend correlation for prior instrumentation.  Probable left adnexal hydrosalpinx.  No abnormal bowel wall thickening or evidence for bowel obstruction.  Persistent bilateral inguinal adenopathy.     Medical Consultants:    Gastroenterology  Anti-Infectives:    Flagyl 05/28/14--->  Tetracycline 05/28/14 --->  Subjective:   Kristy Harris has not had any further bloody stools overnight. No nausea or vomiting. She continues to report a dull achy lower abdominal pain. No dysuria. No fever/chills.  Objective:    Filed Vitals:   05/28/14 1642 05/28/14 1850 05/28/14 2214 05/29/14 0629  BP:  136/54 151/57 168/66  Pulse:  83 88 97  Temp:   98.1 F (36.7 C) 98.6 F (37 C)  TempSrc:   Oral Oral  Resp:   18 20  Height: 5\' 3"  (1.6 m)     Weight: 128.459 kg (283 lb 3.2 oz)     SpO2:   97% 92%    Intake/Output Summary (Last 24 hours) at 05/29/14 0745 Last data filed at 05/29/14 0630  Gross per 24 hour  Intake  957.5 ml  Output   1450 ml  Net -492.5 ml    Exam: Gen:  NAD Cardiovascular:  RRR, No M/R/G Respiratory:  Lungs CTAB Gastrointestinal:  Abdomen soft, NT/ND, + BS Extremities:  Trace edema   Data Reviewed:    Labs: Basic Metabolic Panel:  Recent Labs Lab 05/28/14 1111  NA 138  K 4.4  CL 102  CO2 27  GLUCOSE 129*  BUN 14  CREATININE 0.69  CALCIUM 8.7   GFR Estimated Creatinine Clearance: 90.4 mL/min (by C-G formula based on Cr of 0.69). Liver Function Tests:  Recent Labs Lab 05/28/14 1111  AST 30  ALT 21  ALKPHOS 104  BILITOT 0.8  PROT 7.5  ALBUMIN 3.4*   CBC:  Recent Labs Lab 05/28/14 1111 05/28/14 1632 05/29/14 0023  WBC 6.1  --   --   NEUTROABS 3.3  --   --   HGB 8.6* 8.0* 8.2*  HCT 28.6*  --   --   MCV 77.7*  --    --   PLT 257  --   --    CBG:  Recent Labs Lab 05/28/14 1741 05/28/14 2250 05/29/14 0728  GLUCAP 144* 141* 131*    Microbiology Recent Results (from the past 240 hour(s))  Clostridium Difficile by PCR     Status: None   Collection Time: 05/28/14  3:59 PM  Result Value Ref Range Status   C difficile by pcr NEGATIVE NEGATIVE Final     Medications:   . antiseptic oral rinse  7 mL Mouth Rinse BID  . atorvastatin  20 mg Oral q morning - 10a  . bismuth subsalicylate  201 mg Oral BID  . cholecalciferol  5,000 Units Oral q morning - 10a  . famotidine  40 mg Oral Daily  . insulin aspart  0-15 Units Subcutaneous TID WC  . insulin aspart  0-5 Units Subcutaneous QHS  . losartan  100 mg Oral Daily  . metFORMIN  500 mg Oral BID WC  . metoprolol succinate  100 mg Oral Daily  . metroNIDAZOLE  500 mg Oral BID  . tetracycline  500 mg Oral QID  . verapamil  120 mg Oral Daily   Continuous Infusions: . sodium chloride 75 mL/hr at 05/29/14 0038    Time spent: 25 minutes.   LOS: 1 day   Tressy Kunzman  [facility] Pager [phone]. If unable to reach me by pager, please call my cell phone at [phone].  *Please refer to [email or web address], password [email or web address] to get updated schedule on who will round on this patient, as hospitalists switch teams weekly. If 7PM-7AM, please contact night-coverage at [email or web address], password [email or web address] for any overnight needs.  05/29/2014, 7:45 AM

## 2014-05-30 ENCOUNTER — Other Ambulatory Visit: Payer: Self-pay | Admitting: Physician Assistant

## 2014-05-30 DIAGNOSIS — K5731 Diverticulosis of large intestine without perforation or abscess with bleeding: Secondary | ICD-10-CM | POA: Insufficient documentation

## 2014-05-30 DIAGNOSIS — D62 Acute posthemorrhagic anemia: Secondary | ICD-10-CM

## 2014-05-30 LAB — GLUCOSE, CAPILLARY
GLUCOSE-CAPILLARY: 118 mg/dL — AB (ref 70–99)
GLUCOSE-CAPILLARY: 143 mg/dL — AB (ref 70–99)

## 2014-05-30 LAB — HEMOGLOBIN
HEMOGLOBIN: 7.9 g/dL — AB (ref 12.0–15.0)
HEMOGLOBIN: 8.1 g/dL — AB (ref 12.0–15.0)

## 2014-05-30 MED ORDER — POLYETHYLENE GLYCOL 3350 17 G PO PACK: 17.0000 g | PACK | Freq: Every day | ORAL | Status: AC | PRN

## 2014-05-30 MED ORDER — BISMUTH SUBSALICYLATE 262 MG PO CHEW: 524.0000 mg | CHEWABLE_TABLET | Freq: Two times a day (BID) | ORAL | Status: DC

## 2014-05-30 MED ORDER — FERROUS SULFATE 325 (65 FE) MG PO TABS: 325.0000 mg | ORAL_TABLET | ORAL | Status: DC

## 2014-05-30 MED ORDER — METRONIDAZOLE 500 MG PO TABS: 500.0000 mg | ORAL_TABLET | Freq: Two times a day (BID) | ORAL | Status: DC

## 2014-05-30 MED ORDER — PANTOPRAZOLE SODIUM 40 MG PO TBEC: 40.0000 mg | DELAYED_RELEASE_TABLET | Freq: Two times a day (BID) | ORAL | Status: DC

## 2014-05-30 MED ORDER — TETRACYCLINE HCL 500 MG PO CAPS: 500.0000 mg | ORAL_CAPSULE | Freq: Four times a day (QID) | ORAL | Status: DC

## 2014-05-30 MED ORDER — ONDANSETRON HCL 4 MG PO TABS: 4.0000 mg | ORAL_TABLET | Freq: Four times a day (QID) | ORAL | Status: DC | PRN

## 2014-05-30 NOTE — Progress Notes (Signed)
Nurse reviewed discharge instructions with pt.  Pt verbalized understanding of discharge instructions, follow up appointments and new medications.  No concerns at time of discharge.  Prescriptions given to pt prior to discharge. 

## 2014-05-30 NOTE — Progress Notes (Signed)
     [facility] Progress Note  Subjective:   Hgb this morning 8.1. Has some lightheadedness when she gets OOB. Has been OOB to chair but has not yet ambulated in hall. Hungry--would like to eat. No abd pain. No further rectal bleeding.   Objective:  Vital signs in last 24 hours: Temp:  [97.6 F (36.4 C)-98.7 F (37.1 C)] 97.6 F (36.4 C) (03/29 9357) Pulse Rate:  [65-94] 94 (03/29 0613) Resp:  [18] 18 (03/29 0613) BP: (144-154)/(55-64) 154/64 mmHg (03/29 0613) SpO2:  [95 %-99 %] 96 % (03/29 0613) Last BM Date: 05/28/14 General:   Alert,  Well-developed, female in NAD Heart:  Regular rate and rhythm; no murmurs Pulm;lungs clear Abdomen:  Soft, nontender and nondistended. Normal bowel sounds, without guarding, and without rebound.   Extremities:  Without edema. Neurologic:  Alert and  oriented x4;  grossly normal neurologically. Psych:  Alert and cooperative. Normal mood and affect.    ASSESSMENT/PLAN:   #1. Lower GI bleed. Hemoglobin 8.1 this morning. Recent colonoscopy with extensive diverticular disease, and etiology of her bleed at this time is likely diverticular.  Advance diet--should be on low residue diet for 7-10 days, then can re-introduce fiber. Patient is requesting dietary consult to review low residue diet as well as high fiber diet that she will eventually be on. Pt to ambulate with assistance--if dizzy, etc--may need 1 unit prbcs. Pt can resume oral iron on discharge.  #2. H. pylori positivity. Patient had been started on Flagyl, tetracycline, and bismuth. Will increase PPI to twice a day.Pt will need to f/u in GI office in 3-4 weeeks. Will check  h pylori stool antigen at that time to see if she has cleared infection after completion of h pylori therapy.Continue ppi qd once completed h pylori therapy.   LOS: 2 days   Hvozdovic, Deloris Ping 05/30/2014, Pager [phone]  Agree w/ Ms. Hvozdovic's note and mangement. Gatha Mayer, MD, [facility] [phone] (pager) 05/30/2014 3:48 PM

## 2014-05-30 NOTE — Discharge Instructions (Signed)
Helicobacter Pylori Antibodies Test This is a blood test which looks for a germ called Helicobacter pylori. This can also be diagnosed by a breath test or a microscopic examination of a portion (biopsy) of the small bowel. H. pylori is a germ that is found in the cells that line the stomach. It is a risk factor for stomach and small bowel ulcers, long-standing inflammation of the lining of the stomach, or even ulcers that may occur in the esophagus (the canal that runs from the mouth to the stomach). This bacterium is also a factor in stomach cancer. The amount of the bacteria is found in about 10% of healthy persons younger than 67 years of age and the amount of the bacteria increases with age. Most persons with these bacteria have no symptoms; however, it is thought that when these bacteria cause ulcers, antibiotic medications can be used to help eliminate or reduce the problem.  PREPARATION FOR TEST No preparation or fasting is necessary. NORMAL FINDINGS Negative (H. pylori bacteria not present). Ranges for normal findings may vary among different laboratories and hospitals. You should always check with your doctor after having lab work or other tests done to discuss the meaning of your test results and whether or not your values are considered within normal limits. MEANING OF TEST  Your caregiver will go over the test results with you and discuss the importance and meaning of your results, as well as treatment options and the need for additional tests if necessary. OBTAINING THE TEST RESULTS It is your responsibility to obtain your test results. Ask the lab or department performing the test when and how you will get your results. Document Released: 03/13/2004 Document Revised: 07/04/2013 Document Reviewed: 01/29/2008 [facility] Patient Information 2015 [place], [place]. This information is not intended to replace advice given to you by your health care provider. Make sure you discuss any questions you  have with your health care provider.

## 2014-05-30 NOTE — Discharge Summary (Signed)
Physician Discharge Summary  Kristy Harris NLZ:[id number] DOB: 11/08/1945 DOA: 05/28/2014  PCP: [name], MD  Admit date: 05/28/2014 Discharge date: 05/30/2014  Recommendations for Outpatient Follow-up:  1. Continue bismuth, tetracycline, Flagyl and Protonix as prescribed for 11 days on discharge. 2. Follow up in GI office per scheduled appointment 3. Stop taking aspirin until seen by GI to make sure hemoglobin is stable. 4. Continue iron supplementation.   Discharge Diagnoses:  Principal Problem:   Lower GI bleed Active Problems:   Hypertension   Hyperlipidemia   Diabetes mellitus   Iron deficiency anemia   Abscess of skin and subcutaneous tissue   Rectal bleeding   H. pylori infection    Discharge Condition: stable   Diet recommendation: as tolerated   History of present illness:  69 y.o. female with a PMH of iron deficiency anemia s/p EGD/colonoscopy 11/18/13 which showed chronic active gastritis/H.Pylori +, and severe diverticulosis who was admitted 05/28/14 with a chief complaint of bright red blood per rectum in the setting of daily aspirin intake. Upon initial evaluation in the ED, the patient was hemodynamically stable. Hemoglobin was 8.6, similar to usual baseline. Noted to have maroon colored watery stools by EDP. Rectal with grossly + blood. CT abdomen showed no abnormal bowel wall thickening or evidence for bowel obstruction.   Assessment/Plan:   Principal Problem:  Lower GI bleed with iron deficiency anemia - s/p EGD/colonoscopy 11/18/13 which showed chronic active gastritis/H.Pylori +, and severe diverticulosis.  - Probable diverticular given colonoscopy findings.  - Hemoglobin is 8.1, stable. Patient refused to have blood transfusion. - Holding aspirin. - Continue iron supplement on discharge - Continue Protonix twice daily. No need to be on Pepcid since she is on Protonix.   Active Problems:  H. Pylori infection - Was never  treated. Therapy with Flagyl, tetracycline and Bismuth initiated. - Continue Protonix twice daily.   Hypertension - Continue Cozaar, Metoprolol and Verapamil.   Hyperlipidemia - Continue Lipitor.   Diabetes mellitus - Continue Metformin on discharge.     DVT prophylaxis - SCDs because of risk of bleeding.  Code Status: Full. Family Communication: Multiple family updated on admission, no family present today.    IV Access:    Peripheral IV   Procedures and diagnostic studies:   Ct Abdomen Pelvis W Contrast 05/28/2014 : Nonspecific soft tissue thickening and stranding involving the left labia. Recommend clinical correlation. Few foci of gas within the urinary bladder, recommend correlation for prior instrumentation. Probable left adnexal hydrosalpinx. No abnormal bowel wall thickening or evidence for bowel obstruction. Persistent bilateral inguinal adenopathy.    Medical Consultants:    Gastroenterology  Anti-Infectives:    Flagyl 05/28/14---> for 11 days on D/C  Tetracycline 05/28/14 ---> for 11 days on D/C    Signed:  Leisa Lenz, MD  [facility] 05/30/2014, 10:17 AM  Pager #: [phone]   Discharge Exam: Filed Vitals:   05/30/14 0613  BP: 154/64  Pulse: 94  Temp: 97.6 F (36.4 C)  Resp: 18   Filed Vitals:   05/29/14 0629 05/29/14 1423 05/29/14 2117 05/30/14 0613  BP: 168/66 144/55 147/61 154/64  Pulse: 97 65 90 94  Temp: 98.6 F (37 C) 97.7 F (36.5 C) 98.7 F (37.1 C) 97.6 F (36.4 C)  TempSrc: Oral Oral Oral Oral  Resp: 20 18 18 18   Height:      Weight:      SpO2: 92% 99% 95% 96%    General: Pt is alert, follows commands appropriately, not  in acute distress Cardiovascular: Regular rate and rhythm, S1/S2 +, no murmurs Respiratory: Clear to auscultation bilaterally, no wheezing, no crackles, no rhonchi Abdominal: Soft, non tender, non distended, bowel sounds +, no guarding Extremities: no edema,  no cyanosis, pulses palpable bilaterally DP and PT Neuro: Grossly nonfocal  Discharge Instructions  Discharge Instructions    Call MD for:  difficulty breathing, headache or visual disturbances    Complete by:  As directed      Call MD for:  persistant dizziness or light-headedness    Complete by:  As directed      Call MD for:  persistant nausea and vomiting    Complete by:  As directed      Call MD for:  severe uncontrolled pain    Complete by:  As directed      Diet - low sodium heart healthy    Complete by:  As directed      Discharge instructions    Complete by:  As directed   1. Continue bismuth, tetracycline, Flagyl and Protonix as prescribed for 11 days on discharge. 2. Follow up in GI office per scheduled appointment 3. Stop taking aspirin until seen by GI to make sure hemoglobin is stable. 4. Continue iron supplementation.     Increase activity slowly    Complete by:  As directed             Medication List    STOP taking these medications        aspirin 81 MG tablet     famotidine 20 MG tablet  Commonly known as:  PEPCID     lisinopril 40 MG tablet  Commonly known as:  PRINIVIL,ZESTRIL     Metronid-Tetracyc-Bis Subsal Misc  Commonly known as:  HELIDAC      TAKE these medications        acetaminophen 325 MG tablet  Commonly known as:  TYLENOL  Take 325-650 mg by mouth every 6 (six) hours as needed for pain.     albuterol 108 (90 BASE) MCG/ACT inhaler  Commonly known as:  PROVENTIL HFA;VENTOLIN HFA  Inhale 2 puffs into the lungs every 4 (four) hours as needed for shortness of breath.     atorvastatin 20 MG tablet  Commonly known as:  LIPITOR  Take 20 mg by mouth every morning.     Bismuth Subsalicylate 258 MG Tabs  Commonly known as:  SB BISMUTH  Use 2 by mouth four times a day for Hpylori along with antibiotics and PPI     bismuth subsalicylate 527 MG chewable tablet  Commonly known as:  PEPTO BISMOL  Chew 2 tablets (524 mg total) by mouth 2  (two) times daily.     ferrous sulfate 325 (65 FE) MG tablet  Take 1 tablet (325 mg total) by mouth every morning.     HYDROcodone-acetaminophen 5-325 MG per tablet  Commonly known as:  NORCO/VICODIN  Take 1 tablet by mouth 2 (two) times daily.     losartan 100 MG tablet  Commonly known as:  COZAAR  Take 100 mg by mouth daily.     metFORMIN 500 MG 24 hr tablet  Commonly known as:  GLUCOPHAGE-XR  Take 500 mg by mouth 2 (two) times daily.     metoprolol succinate 100 MG 24 hr tablet  Commonly known as:  TOPROL-XL  Take 100 mg by mouth every morning.     metroNIDAZOLE 500 MG tablet  Commonly known as:  FLAGYL  Take 1 tablet (500  mg total) by mouth 2 (two) times daily.     ondansetron 4 MG tablet  Commonly known as:  ZOFRAN  Take 1 tablet (4 mg total) by mouth every 6 (six) hours as needed for nausea.     pantoprazole 40 MG tablet  Commonly known as:  PROTONIX  Take 1 tablet (40 mg total) by mouth 2 (two) times daily.     polyethylene glycol packet  Commonly known as:  MIRALAX / GLYCOLAX  Take 17 g by mouth daily as needed for mild constipation.     tetracycline 500 MG capsule  Commonly known as:  ACHROMYCIN,SUMYCIN  Take 1 capsule (500 mg total) by mouth 4 (four) times daily.     verapamil 120 MG tablet  Commonly known as:  CALAN  Take 120 mg by mouth every morning.     Vitamin D-3 5000 UNITS Tabs  Take 5,000 Units by mouth every morning.           Follow-up Information    Follow up with Hvozdovic, Vita Barley, PA-C On 06/21/2014.   Specialty:  Physician Assistant   Why:  appt at 9:15--please be there at 9:00. Please go to [facility] lab on the basement floo[address] of the [place] building on 4/18 for blood work.   Contact information:   [place] [phone]       Follow up with [name], MD. Schedule an appointment as soon as possible for a visit in 2 weeks.   Specialty:  Family Medicine   Why:  Follow up appt after recent  hospitalization   Contact information:   [place] [id number] [phone]        The results of significant diagnostics from this hospitalization (including imaging, microbiology, ancillary and laboratory) are listed below for reference.    Significant Diagnostic Studies: Ct Abdomen Pelvis W Contrast  05/28/2014   CLINICAL DATA:  Patient with mid abdominal pain and rectal bleeding for 1 week.  EXAM: CT ABDOMEN AND PELVIS WITH CONTRAST  TECHNIQUE: Multidetector CT imaging of the abdomen and pelvis was performed using the standard protocol following bolus administration of intravenous contrast.  CONTRAST:  86mL OMNIPAQUE IOHEXOL 300 MG/ML SOLN, 19mL OMNIPAQUE IOHEXOL 300 MG/ML SOLN  COMPARISON:  MRI pelvis 11/09/2012  FINDINGS: Lower chest: No consolidative or nodular pulmonary opacities. Normal heart size.  Hepatobiliary: Liver is normal in size and contour without focal hepatic lesion identified. Gallbladder is unremarkable. No intrahepatic or extrahepatic biliary ductal dilatation.  Pancreas: Unremarkable  Spleen: Multiple indeterminate sub cm splenic hypodensities, too small to characterize.  Adrenals/Urinary Tract: Normal adrenal glands. Kidneys are symmetric in size and enhance with contrast. No hydronephrosis. There a few foci of gas anteriorly within the urinary bladder.  Stomach/Bowel: No abnormal bowel wall thickening or evidence for bowel obstruction. Normal appendix.  Vascular/Lymphatic: Normal caliber abdominal aorta. No retroperitoneal lymphadenopathy. Persistent prominent bilateral inguinal lymph nodes. Bilateral external iliac lymphadenopathy.  Other: There is soft tissue thickening and stranding involving the left labia (image 82; series 2). Re- demonstrated left adnexal cystic mass, favored represent a hydrosalpinx. Post hysterectomy.  Musculoskeletal: Multilevel degenerative changes of the lower thoracic and lumbar spine. Patchy areas of sclerosis involving the lower  thoracic and lumbar spine likely secondary to a combination of degenerative change, osteoporosis and obesity. No definite discrete lytic or sclerotic lesions identified.  IMPRESSION: Nonspecific soft tissue thickening and stranding involving the left labia. Recommend clinical correlation.  Few foci of gas within the  urinary bladder, recommend correlation for prior instrumentation.  Probable left adnexal hydrosalpinx.  No abnormal bowel wall thickening or evidence for bowel obstruction.  Persistent bilateral inguinal adenopathy.   Electronically Signed   By: Lovey Newcomer M.D.   On: 05/28/2014 14:34    Microbiology: Recent Results (from the past 240 hour(s))  Clostridium Difficile by PCR     Status: None   Collection Time: 05/28/14  3:59 PM  Result Value Ref Range Status   C difficile by pcr NEGATIVE NEGATIVE Final     Labs: Basic Metabolic Panel:  Recent Labs Lab 05/28/14 1111  NA 138  K 4.4  CL 102  CO2 27  GLUCOSE 129*  BUN 14  CREATININE 0.69  CALCIUM 8.7   Liver Function Tests:  Recent Labs Lab 05/28/14 1111  AST 30  ALT 21  ALKPHOS 104  BILITOT 0.8  PROT 7.5  ALBUMIN 3.4*   No results for input(s): LIPASE, AMYLASE in the last 168 hours. No results for input(s): AMMONIA in the last 168 hours. CBC:  Recent Labs Lab 05/28/14 1111  05/29/14 0023 05/29/14 0822 05/29/14 1635 05/30/14 0040 05/30/14 0844  WBC 6.1  --   --   --   --   --   --   NEUTROABS 3.3  --   --   --   --   --   --   HGB 8.6*  < > 8.2* 8.0* 7.4* 7.9* 8.1*  HCT 28.6*  --   --   --   --   --   --   MCV 77.7*  --   --   --   --   --   --   PLT 257  --   --   --   --   --   --   < > = values in this interval not displayed. Cardiac Enzymes: No results for input(s): CKTOTAL, CKMB, CKMBINDEX, TROPONINI in the last 168 hours. BNP: BNP (last 3 results) No results for input(s): BNP in the last 8760 hours.  ProBNP (last 3 results) No results for input(s): PROBNP in the last 8760  hours.  CBG:  Recent Labs Lab 05/29/14 0728 05/29/14 1247 05/29/14 1736 05/29/14 2144 05/30/14 0739  GLUCAP 131* 101* 97 122* 118*    Time coordinating discharge: Over 30 minutes

## 2014-06-08 ENCOUNTER — Other Ambulatory Visit: Payer: Self-pay | Admitting: Obstetrics and Gynecology

## 2014-06-09 LAB — CYTOLOGY - PAP

## 2014-06-21 ENCOUNTER — Ambulatory Visit: Payer: Medicare Other | Admitting: Physician Assistant

## 2014-06-21 ENCOUNTER — Other Ambulatory Visit (INDEPENDENT_AMBULATORY_CARE_PROVIDER_SITE_OTHER): Payer: Medicare Other

## 2014-06-21 DIAGNOSIS — D62 Acute posthemorrhagic anemia: Secondary | ICD-10-CM | POA: Diagnosis not present

## 2014-06-21 LAB — CBC
HCT: 30.9 % — ABNORMAL LOW (ref 36.0–46.0)
Hemoglobin: 9.8 g/dL — ABNORMAL LOW (ref 12.0–15.0)
MCHC: 31.8 g/dL (ref 30.0–36.0)
MCV: 77.4 fl — AB (ref 78.0–100.0)
Platelets: 269 10*3/uL (ref 150.0–400.0)
RBC: 3.99 Mil/uL (ref 3.87–5.11)
RDW: 20.3 % — AB (ref 11.5–15.5)
WBC: 8.3 10*3/uL (ref 4.0–10.5)

## 2014-06-28 ENCOUNTER — Encounter: Payer: Self-pay | Admitting: Physician Assistant

## 2014-06-28 ENCOUNTER — Ambulatory Visit (INDEPENDENT_AMBULATORY_CARE_PROVIDER_SITE_OTHER): Payer: Medicare Other | Admitting: Physician Assistant

## 2014-06-28 VITALS — BP 146/84 | Ht 62.5 in | Wt 289.8 lb

## 2014-06-28 DIAGNOSIS — K5791 Diverticulosis of intestine, part unspecified, without perforation or abscess with bleeding: Secondary | ICD-10-CM | POA: Diagnosis not present

## 2014-06-28 DIAGNOSIS — B9681 Helicobacter pylori [H. pylori] as the cause of diseases classified elsewhere: Secondary | ICD-10-CM | POA: Diagnosis not present

## 2014-06-28 DIAGNOSIS — Z8719 Personal history of other diseases of the digestive system: Secondary | ICD-10-CM

## 2014-06-28 DIAGNOSIS — D62 Acute posthemorrhagic anemia: Secondary | ICD-10-CM

## 2014-06-28 DIAGNOSIS — A048 Other specified bacterial intestinal infections: Secondary | ICD-10-CM

## 2014-06-28 MED ORDER — PANTOPRAZOLE SODIUM 40 MG PO TBEC: DELAYED_RELEASE_TABLET | ORAL | Status: DC

## 2014-06-28 NOTE — Progress Notes (Signed)
Patient ID: Kristy Harris, female   DOB: [birth year], 69 y.o.   MRN: [id number]     History of Present Illness: Kristy Harris  is a delightful 69 year old female who was admitted to the hospital on 05/28/2014 with bright red blood per rectum. She has a history of diabetes, obesity, hypertension, hyperlipidemia, COPD, anemia, sleep apnea, diverticulosis, and hidradenitis. She was seen by Dr. Deatra Ina in July 2015 for evaluation of anemia. She was scheduled for a colonoscopy and upper endoscopy which was performed in September 2015 she was found to have gastritis and biopsies found her to be H. pylori positive. Colonoscopy revealed diverticular disease. Patient did not remember ever being given the results of her testing and says she was unaware that medications were called in to treat her H. pylori and thus did not take them. She reported that for a week prior to her admission she had been constipated and straining to pass nugget like stools at that time she had blood on the toilet tissue 2 days prior to admission her stools were formed but she had bloody spotting and then began to have dark red and burgundy stools. She was admitted to the hospital and it was felt that she had a diverticular bleed she was treated with bowel rest and IV hydration. Since hemoglobin stabilized and she was discharged home on March 29. She was advised to adhere to a low residue diet. She was treated with Flagyl tetracycline bismuth and proton next for her H. pylori. She returns today for follow-up she is moving her bowels regularly with daily use of Mira lax. She has had no bright red blood per rectum or melena. She has no epigastric pain. She completed treatment for her H pylori 2 weeks ago. She had a repeat CBC on April 20 and her hemoglobin was 9.8 with an MCV of 77.4.   Past Medical History  Diagnosis Date  . Diabetes mellitus   . Morbid obesity   . Recurrent boils     of perineal and buttocks  . Hypertension   .  Hyperlipidemia   . COPD (chronic obstructive pulmonary disease)   . Enlarged heart   . Anemia   . Arthritis   . Heart murmur   . Urinary frequency   . Sleep apnea     no cpap used since weight loss  . H. pylori infection   . Diverticulosis   . Hidradenitis suppurativa     Past Surgical History  Procedure Laterality Date  . Pilonidal cyst excision    . Bladder suspension      x 2  . Abcess drainage    . Cardiac catheterization  02/21/2011    Normal coronary arteries, normal EF  . Tonsillectomy  yrs ago  . Abdominal hysterectomy  25 yrs ago  . Irrigation and debridement abscess Right 12/23/2012    Procedure: incision  AND DEBRIDEMENT right buttock infection ;  Surgeon: Shann Medal, MD;  Location: [facility];  Service: General;  Laterality: Right;  . Hydradenitis excision Left 12/08/2013    Procedure: EXCISION HIDRADENITIS AXILLA;  Surgeon: Alphonsa Overall, MD;  Location: [facility];  Service: General;  Laterality: Left;  . Excision hydradenitis labia N/A 12/08/2013    Procedure: EXCISION HIDRADENITIS PUBIC AREA;  Surgeon: Alphonsa Overall, MD;  Location: [facility];  Service: General;  Laterality: N/A;   Family History  Problem Relation Age of Onset  . Heart attack Mother     had multiple health problems  . Lung  cancer Father   . Lung cancer Brother     had multiple health problems  . Heart attack Brother     had multiple health problems  . Diabetes Brother     has 1 living brother with multiple health problems  . Diabetes Sister     has 4 living sisters with multiple health problems  . Other Sister     died from gun shot wound to the head   History  Substance Use Topics  . Smoking status: Former Smoker -- 1.00 packs/day for 10 years    Types: Cigarettes    Quit date: 03/03/1993  . Smokeless tobacco: Never Used  . Alcohol Use: No   Current Outpatient Prescriptions  Medication Sig Dispense Refill  . acetaminophen (TYLENOL) 325 MG tablet Take 325-650 mg by mouth every 6 (six) hours  as needed for pain.    Marland Kitchen albuterol (PROVENTIL HFA;VENTOLIN HFA) 108 (90 BASE) MCG/ACT inhaler Inhale 2 puffs into the lungs every 4 (four) hours as needed for shortness of breath.    Marland Kitchen atorvastatin (LIPITOR) 20 MG tablet Take 20 mg by mouth every morning.     . bismuth subsalicylate (PEPTO BISMOL) 262 MG chewable tablet Chew 2 tablets (524 mg total) by mouth 2 (two) times daily. 22 tablet 0  . Bismuth Subsalicylate (SB BISMUTH) 262 MG TABS Use 2 by mouth four times a day for Hpylori along with antibiotics and PPI 80 each 0  . Cholecalciferol (VITAMIN D-3) 5000 UNITS TABS Take 5,000 Units by mouth every morning.    . ferrous sulfate 325 (65 FE) MG tablet Take 1 tablet (325 mg total) by mouth every morning. 30 tablet 0  . HYDROcodone-acetaminophen (NORCO/VICODIN) 5-325 MG per tablet Take 1 tablet by mouth 2 (two) times daily.     Marland Kitchen losartan (COZAAR) 100 MG tablet Take 100 mg by mouth daily.    . metFORMIN (GLUCOPHAGE-XR) 500 MG 24 hr tablet Take 500 mg by mouth 2 (two) times daily.     . metoprolol (TOPROL-XL) 100 MG 24 hr tablet Take 100 mg by mouth every morning.     . ondansetron (ZOFRAN) 4 MG tablet Take 1 tablet (4 mg total) by mouth every 6 (six) hours as needed for nausea. 20 tablet 0  . pantoprazole (PROTONIX) 40 MG tablet Take one tablet twice a day for 6 weeks and then 1 tablet once a day 60 tablet 3  . polyethylene glycol (MIRALAX / GLYCOLAX) packet Take 17 g by mouth daily as needed for mild constipation. 14 each 0  . verapamil (CALAN) 120 MG tablet Take 120 mg by mouth every morning.      No current facility-administered medications for this visit.   Allergies  Allergen Reactions  . Penicillins Rash    All over the body      Review of Systems: Per history of present illness otherwise negative.    Physical Exam: General: Pleasant, well developed female in no acute distress Head: Normocephalic and atraumatic Eyes:  sclerae anicteric, conjunctiva pink  Ears: Normal auditory  acuity Lungs: Clear throughout to auscultation Heart: Regular rate and rhythm Abdomen: Soft, non distended, non-tender. No masses, no hepatomegaly. Normal bowel soun Musculoskeletal: Symmetrical with no gross deformities  Extremities: No edema  Neurological: Alert oriented x 4, grossly nonfocal Psychological:  Alert and cooperative. Normal mood and affect  Assessment and Recommendations:  #1. Status post admission for lower GI bleed. Bleed was likely diverticular in nature. Patient has been instructed to adhere to  a high-fiber low-fat diet. She will continue to use Mira lax on a daily basis to avoid becoming constipated. New her iron tablets and states she has a follow-up with her physician in [place] to repeat her CBC.  #2. H. pylori positivity. She recently completed therapy for her H. pylori 2 weeks ago. She will be sent for an H. pylori stool antigen in 3-4 weeks.  She will follow up in 3-4 months, sooner if needed.       Dashawn Golda, Deloris Ping 06/28/2014,

## 2014-06-28 NOTE — Patient Instructions (Addendum)
We have sent the following medications to your pharmacy for you to pick up at your convenience:  Pantoprazole  Eat a high fiber, low fat diet  Continue iron as directed  Continue Miralax  Please follow up with Dr. Deatra Ina in 3-4 months   Patient told to notify her PCP regarding her elevated blood pressure

## 2014-06-29 NOTE — Progress Notes (Signed)
Reviewed and agree with management. Liani Caris D. Srihitha Tagliaferri, M.D., FACG  

## 2014-07-20 ENCOUNTER — Other Ambulatory Visit: Payer: Medicare Other

## 2014-07-20 DIAGNOSIS — Z8719 Personal history of other diseases of the digestive system: Secondary | ICD-10-CM

## 2014-07-20 DIAGNOSIS — A048 Other specified bacterial intestinal infections: Secondary | ICD-10-CM

## 2014-07-20 DIAGNOSIS — D62 Acute posthemorrhagic anemia: Secondary | ICD-10-CM

## 2014-07-20 DIAGNOSIS — K5791 Diverticulosis of intestine, part unspecified, without perforation or abscess with bleeding: Secondary | ICD-10-CM

## 2014-07-21 LAB — HELICOBACTER PYLORI  SPECIAL ANTIGEN: H. PYLORI Antigen: NEGATIVE

## 2014-08-09 ENCOUNTER — Other Ambulatory Visit: Payer: Self-pay

## 2014-08-09 MED ORDER — PANTOPRAZOLE SODIUM 40 MG PO TBEC: DELAYED_RELEASE_TABLET | ORAL | Status: DC

## 2014-09-05 ENCOUNTER — Other Ambulatory Visit: Payer: Self-pay | Admitting: *Deleted

## 2014-09-05 MED ORDER — FAMOTIDINE 20 MG PO TABS: 20.0000 mg | ORAL_TABLET | Freq: Two times a day (BID) | ORAL | Status: DC

## 2014-10-05 ENCOUNTER — Encounter: Payer: Self-pay | Admitting: Gastroenterology

## 2014-10-05 ENCOUNTER — Ambulatory Visit (INDEPENDENT_AMBULATORY_CARE_PROVIDER_SITE_OTHER): Payer: Medicare Other | Admitting: Gastroenterology

## 2014-10-05 VITALS — BP 122/70 | HR 88 | Ht 62.5 in | Wt 293.5 lb

## 2014-10-05 DIAGNOSIS — B9681 Helicobacter pylori [H. pylori] as the cause of diseases classified elsewhere: Secondary | ICD-10-CM | POA: Diagnosis not present

## 2014-10-05 DIAGNOSIS — K5731 Diverticulosis of large intestine without perforation or abscess with bleeding: Secondary | ICD-10-CM

## 2014-10-05 DIAGNOSIS — A048 Other specified bacterial intestinal infections: Secondary | ICD-10-CM

## 2014-10-05 NOTE — Assessment & Plan Note (Signed)
Treated and presumably resolved

## 2014-10-05 NOTE — Patient Instructions (Signed)
Follow up as needed

## 2014-10-05 NOTE — Progress Notes (Signed)
      History of Present Illness:  Ms. Dambrosio has returned for follow-up of GI issues.  She was treated for an H. pylori infection.  She has a history of diverticular bleeding but has had no recurrences.  Except for very mild, intermittent left lower quadrant discomfort, which she attributes to gas, she is feeling well.    Review of Systems: Pertinent positive and negative review of systems were noted in the above HPI section. All other review of systems were otherwise negative.    Current Medications, Allergies, Past Medical History, Past Surgical History, Family History and Social History were reviewed in [facility] record  Vital signs were reviewed in today's medical record. Physical Exam: General: Well developed , well nourished, no acute distress Skin: anicteric Head: Normocephalic and atraumatic Eyes:  sclerae anicteric, EOMI Ears: Normal auditory acuity Mouth: No deformity or lesions Lymph Nodes: no lymphadenopathy Lungs: Clear throughout to auscultation Heart: Regular rate and rhythm; no murmurs, rubs or brui: Gastroinestinal:  Soft, non tender and non distended. No masses, hepatosplenomegaly or hernias noted. Normal Bowel sounds Rectal:deferred Musculoskeletal: Symmetrical with no gross deformities  Pulses:  Normal pulses noted Extremities: No clubbing, cyanosis, edema or deformities noted Neurological: Alert oriented x 4, grossly nonfocal Psychological:  Alert and cooperative. Normal mood and affect  See Assessment and Plan under Problem List

## 2014-10-05 NOTE — Assessment & Plan Note (Signed)
No recurrent bleeding

## 2014-11-01 ENCOUNTER — Other Ambulatory Visit: Payer: Self-pay | Admitting: Physician Assistant

## 2014-11-10 ENCOUNTER — Telehealth: Payer: Self-pay | Admitting: *Deleted

## 2014-11-10 NOTE — Telephone Encounter (Signed)
Received referral from [facility].  Called pt and confirmed 11/13/14 appt w/ her.  Unable to mail packet - gave verbal, directions, instructions and placed a note for an intake form to be given to pt at time of check in.  Called referring, but they were already closed for the day.  Will call back Monday to obtain NPI # & make them aware of the appt.

## 2014-11-13 ENCOUNTER — Encounter: Payer: Self-pay | Admitting: *Deleted

## 2014-11-13 ENCOUNTER — Encounter: Payer: Self-pay | Admitting: Hematology and Oncology

## 2014-11-13 ENCOUNTER — Ambulatory Visit (HOSPITAL_BASED_OUTPATIENT_CLINIC_OR_DEPARTMENT_OTHER): Payer: Medicare Other | Admitting: Hematology and Oncology

## 2014-11-13 ENCOUNTER — Telehealth: Payer: Self-pay | Admitting: *Deleted

## 2014-11-13 VITALS — BP 167/76 | HR 73 | Temp 98.1°F | Resp 21 | Ht 62.5 in | Wt 298.9 lb

## 2014-11-13 DIAGNOSIS — C50412 Malignant neoplasm of upper-outer quadrant of left female breast: Secondary | ICD-10-CM | POA: Diagnosis present

## 2014-11-13 DIAGNOSIS — I10 Essential (primary) hypertension: Secondary | ICD-10-CM

## 2014-11-13 DIAGNOSIS — E669 Obesity, unspecified: Secondary | ICD-10-CM

## 2014-11-13 DIAGNOSIS — E1165 Type 2 diabetes mellitus with hyperglycemia: Secondary | ICD-10-CM

## 2014-11-13 NOTE — Assessment & Plan Note (Signed)
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  Pathology and radiology counseling:Discussed with the patient, the details of pathology including the type of breast cancer,the clinical staging, the significance of ER, PR and HER-2/neu receptors and the implications for treatment. After reviewing the pathology in detail, we proceeded to discuss the different treatment options between surgery, radiation, chemotherapy, antiestrogen therapies.  Recommendations: 1. Breast conserving surgery followed by 2. Oncotype DX testing to determine if chemotherapy would be of any benefit followed by 3. Adjuvant radiation therapy followed by 4. Adjuvant antiestrogen therapy  Oncotype counseling: I discussed Oncotype DX test. I explained to the patient that this is a 21 gene panel to evaluate patient tumors DNA to calculate recurrence score. This would help determine whether patient has high risk or intermediate risk or low risk breast cancer. She understands that if her tumor was found to be high risk, she would benefit from systemic chemotherapy. If low risk, no need of chemotherapy. If she was found to be intermediate risk, we would need to evaluate the score as well as other risk factors and determine if an abbreviated chemotherapy may be of benefit.  Patient has diabetes as well as osteoarthritis knee pain. She used a wheelchair to come to the appointment. She may not be a candidate for full systemic adjuvant chemotherapy but she could potentially tolerate an abbreviated version like Taxotere Cytoxan. Hence we decided that she would benefit from Oncotype DX testing  Return to clinic after surgery to discuss final pathology report and then determine if Oncotype DX testing will need to be sent.

## 2014-11-13 NOTE — Progress Notes (Signed)
Met with pt after Dr. Geralyn Flash new pt appt. Gave navigation resources and contact information. Discussed coordinating appts with Dr. Lucia Gaskins and radiation oncology on the same day if possible. Denies questions or concerns regarding dx or treatment care plan. Encourage pt to call with needs. Received verbal understanding.

## 2014-11-13 NOTE — Progress Notes (Signed)
[facility] NOTE  Patient Care Team: Dione Housekeeper, MD as PCP - General (Family Medicine) Druscilla Brownie, MD as Consulting Physician (Dermatology) Minus Breeding, MD as Consulting Physician (Cardiology) Larey Dresser, MD as Consulting Physician (Cardiology)  CHIEF COMPLAINTS/PURPOSE OF CONSULTATION:  Newly diagnosed breast cancer  HISTORY OF PRESENTING ILLNESS:  Kristy Harris 69 y.o. female is here because of recent diagnosis of left breast cancer. Patient had a routine screening mammogram having missed the last couple of years mammograms. This revealed a suspicious mass in the left breast. She underwent ultrasound which revealed a 13 mm lesion. She underwent ultrasound-guided biopsy on 11/01/2014 which revealed moderately differentiated invasive ductal carcinoma that was ER/PR positive HER-2 negative with a Ki-67 of 29%. She was sent was for discussion regarding treatment options. She is here today accompanied by her daughter. She is essentially wheelchair for long distances and 1 and a cane for walking inside her house. She has bad arthritis. She cannot undergo surgery for her knee sprain the because she has diabetes. She is prone to developing boils which have previously been incised by Dr. Lucia Gaskins.  I reviewed her records extensively and collaborated the history with the patient.  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  She was never exposed to fertility medications or hormone replacement therapy.  She has  family history of Breast cancer Dad died of lung cancer, brother died of lung cancer, sisters had breast cancer unclear age of onset  MEDICAL HISTORY:  Past Medical History  Diagnosis Date  . Diabetes mellitus   . Morbid obesity   . Recurrent boils     of perineal and buttocks  . Hypertension   . Hyperlipidemia   . COPD (chronic obstructive pulmonary disease)   . Enlarged heart   . Anemia   . Arthritis   . Heart murmur   . Urinary frequency   . Sleep apnea     no cpap used since weight loss  . H. pylori infection   . Diverticulosis   . Hidradenitis suppurativa     SURGICAL HISTORY: Past Surgical History  Procedure Laterality Date  . Pilonidal cyst excision    . Bladder suspension      x 2  . Abcess drainage    . Cardiac catheterization  02/21/2011    Normal coronary arteries, normal EF  . Tonsillectomy  yrs ago  . Abdominal hysterectomy  25 yrs ago  . Irrigation and debridement abscess Right 12/23/2012    Procedure: incision  AND DEBRIDEMENT right buttock infection ;  Surgeon: Shann Medal, MD;  Location: [facility];  Service: General;  Laterality: Right;  . Hydradenitis excision Left 12/08/2013    Procedure: EXCISION HIDRADENITIS AXILLA;  Surgeon: Alphonsa Overall, MD;  Location: [facility];  Service: General;  Laterality: Left;  .  Excision hydradenitis labia N/A 12/08/2013    Procedure: EXCISION HIDRADENITIS PUBIC AREA;  Surgeon: Alphonsa Overall, MD;  Location: [facility];  Service: General;  Laterality: N/A;    SOCIAL HISTORY: Social History   Social History  . Marital Status: Widowed    Spouse Name: N/A  . Number of Children: 5  . Years of Education: N/A   Occupational History  . Textile work     Disabled   Social History Main Topics  . Smoking status: Former Smoker -- 1.00  packs/day for 10 years    Types: Cigarettes    Quit date: 03/03/1993  . Smokeless tobacco: Never Used  . Alcohol Use: No  . Drug Use: No  . Sexual Activity: Not on file   Other Topics Concern  . Not on file   Social History Narrative   Lives with grandson.  Widowed.  Has 2 sons and 3 daughters    FAMILY HISTORY: Family History  Problem Relation Age of Onset  . Heart attack Mother     had multiple health problems  . Lung cancer Father   . Lung cancer Brother     had multiple health problems  . Heart attack Brother     had multiple health problems  . Diabetes Brother     has 1 living brother with multiple health problems  . Diabetes Sister     has 4 living sisters with multiple health problems  . Other Sister     died from gun shot wound to the head    ALLERGIES:  is allergic to penicillins.  MEDICATIONS:  Current Outpatient Prescriptions  Medication Sig Dispense Refill  . acetaminophen (TYLENOL) 325 MG tablet Take 325-650 mg by mouth every 6 (six) hours as needed for pain.    Marland Kitchen albuterol (PROVENTIL HFA;VENTOLIN HFA) 108 (90 BASE) MCG/ACT inhaler Inhale 2 puffs into the lungs every 4 (four) hours as needed for shortness of breath.    Marland Kitchen atorvastatin (LIPITOR) 20 MG tablet Take 20 mg by mouth every morning.     . bismuth subsalicylate (PEPTO BISMOL) 262 MG chewable tablet Chew 2 tablets (524 mg total) by mouth 2 (two) times daily. (Patient taking differently: Chew 524 mg by mouth as needed. ) 22 tablet 0  . Cholecalciferol (VITAMIN D-3) 5000 UNITS TABS Take 5,000 Units by mouth every morning.    . famotidine (PEPCID) 20 MG tablet Take 1 tablet (20 mg total) by mouth 2 (two) times daily. 60 tablet 3  . ferrous sulfate 325 (65 FE) MG tablet Take 1 tablet (325 mg total) by mouth every morning. 30 tablet 0  . fluconazole (DIFLUCAN) 100 MG tablet TAKE ONE (1) TABLET EACH DAY    . furosemide (LASIX) 20 MG tablet TAKE ONE TABLET BY MOUTH TWICE DAILY    . glipiZIDE (GLUCOTROL XL)  10 MG 24 hr tablet Take 1 tablet by mouth daily.    Marland Kitchen HYDROcodone-acetaminophen (NORCO/VICODIN) 5-325 MG per tablet Take 1 tablet by mouth 2 (two) times daily.     Marland Kitchen losartan (COZAAR) 100 MG tablet Take 100 mg by mouth daily.    . metFORMIN (GLUCOPHAGE-XR) 500 MG 24 hr tablet Take 500 mg by mouth 2 (two) times daily.     . metoprolol (TOPROL-XL) 100 MG 24 hr tablet Take 100 mg by mouth every morning.     . ondansetron (ZOFRAN) 4 MG tablet Take 1 tablet (4 mg total) by mouth every 6 (six) hours as needed for nausea. 20 tablet 0  .  pantoprazole (PROTONIX) 40 MG tablet Take 1 tablet (40 mg total) by mouth daily. 90 tablet 0  . polyethylene glycol (MIRALAX / GLYCOLAX) packet Take 17 g by mouth daily as needed for mild constipation. 14 each 0  . sulfamethoxazole-trimethoprim (BACTRIM,SEPTRA) 400-80 MG per tablet TAKE ONE TABLET BY MOUTH TWICE DAILY    . verapamil (CALAN) 120 MG tablet Take 120 mg by mouth every morning.      No current facility-administered medications for this visit.    REVIEW OF SYSTEMS:   Constitutional: Denies fevers, chills or abnormal night sweats Eyes: Denies blurriness of vision, double vision or watery eyes Ears, nose, mouth, throat, and face: Denies mucositis or sore throat Respiratory: Denies cough, dyspnea or wheezes Cardiovascular: Denies palpitation, chest discomfort; chronic lower extremity swelling Gastrointestinal:  Denies nausea, heartburn or change in bowel habits Skin:frequent "boils Lymphatics: Denies new lymphadenopathy or easy bruising Neurological:Denies numbness, tingling or new weaknesses Behavioral/Psych: Mood is stable, no new changes  Breast:  Denies any palpable lumps or discharge All other systems were reviewed with the patient and are negative.  PHYSICAL EXAMINATION: ECOG PERFORMANCE STATUS: 2 - Symptomatic, <50% confined to bed  Filed Vitals:   11/13/14 1555  BP: 167/76  Pulse: 73  Temp: 98.1 F (36.7 C)  Resp: 21   Filed Weights    11/13/14 1555  Weight: 298 lb 14.4 oz (135.58 kg)    GENERAL:alert, no distress and comfortable SKIN: skin color, texture, turgor are normal, no rashes or significant lesions EYES: normal, conjunctiva are pink and non-injected, sclera clear OROPHARYNX:no exudate, no erythema and lips, buccal mucosa, and tongue normal  NECK: supple, thyroid normal size, non-tender, without nodularity LYMPH:  no palpable lymphadenopathy in the cervical, axillary or inguinal LUNGS: clear to auscultation and percussion with normal breathing effort HEART: regular rate & rhythm and no murmurs 2+ lower extremity edema ABDOMEN:abdomen soft, non-tender and normal bowel sounds Musculoskeletal:no cyanosis of digits and no clubbing  PSYCH: alert & oriented x 3 with fluent speech NEURO: no focal motor/sensory deficits BREAST:No palpable nodules in breast. No palpable axillary or supraclavicular lymphadenopathy (exam performed in the presence of a chaperone)   LABORATORY DATA:  I have reviewed the data as listed Lab Results  Component Value Date   WBC 8.3 06/21/2014   HGB 9.8* 06/21/2014   HCT 30.9* 06/21/2014   MCV 77.4* 06/21/2014   PLT 269.0 06/21/2014   Lab Results  Component Value Date   NA 138 05/28/2014   K 4.4 05/28/2014   CL 102 05/28/2014   CO2 27 05/28/2014    RADIOGRAPHIC STUDIES: I have personally reviewed the radiological reports and agreed with the findings in the report.  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  Pathology and radiology counseling:Discussed with the patient, the details of pathology including the type of breast cancer,the clinical staging, the significance of ER, PR and HER-2/neu receptors and the implications  for treatment. After reviewing the pathology in detail, we proceeded to discuss the different treatment options between surgery, radiation, chemotherapy, antiestrogen therapies.  Recommendations: 1. Breast conserving surgery followed by 2. Oncotype DX testing to determine if chemotherapy would be of any benefit followed by 3. Adjuvant radiation therapy followed by 4. Adjuvant antiestrogen therapy  Oncotype counseling: I discussed Oncotype  DX test. I explained to the patient that this is a 21 gene panel to evaluate patient tumors DNA to calculate recurrence score. This would help determine whether patient has high risk or intermediate risk or low risk breast cancer. She understands that if her tumor was found to be high risk, she would benefit from systemic chemotherapy. If low risk, no need of chemotherapy. If she was found to be intermediate risk, we would need to evaluate the score as well as other risk factors and determine if an abbreviated chemotherapy may be of benefit.  Patient has diabetes as well as osteoarthritis knee pain. She used a wheelchair to come to the appointment. She may not be a candidate for full systemic adjuvant chemotherapy but she could potentially tolerate an abbreviated version like Taxotere Cytoxan. Hence we decided that she would benefit from Oncotype DX testing  Diabetes and  hypertension: Being followed by primary care  Return to clinic after surgery to discuss final pathology report and then determine if Oncotype DX testing will need to be sent. All questions were answered. The patient knows to call the clinic with any problems, questions or concerns.    Rulon Eisenmenger, MD 4:32 PM

## 2014-11-13 NOTE — Progress Notes (Signed)
Records faxed to Dr. Lucia Gaskins.  Sent to scan.

## 2014-11-13 NOTE — Progress Notes (Signed)
New pt intake form received.  Chart updated.  Reviewed by Dr. Lindi Adie.  Sent to scan.

## 2014-11-13 NOTE — Telephone Encounter (Signed)
Called PCP and spoke w/ Kermitt and he gave me NPI # [id number] for pt.  Added to referral and assigned referral to visit.  Placed a copy of the records in Dr. Geralyn Flash box and took one to HIM to scan.

## 2014-11-14 ENCOUNTER — Telehealth: Payer: Self-pay | Admitting: Hematology and Oncology

## 2014-11-14 NOTE — Telephone Encounter (Signed)
Called dr Jennell Corner office at  [facility] and the patient has already been scheduled 11/16/14 1;45

## 2014-11-17 ENCOUNTER — Other Ambulatory Visit: Payer: Self-pay | Admitting: Hematology and Oncology

## 2014-11-17 NOTE — Progress Notes (Addendum)
Location of Breast Cancer: Left Breast    Histology per Pathology Report: 11/01/14 Biopsy: moderately differentiated invasive ductal carcinoma  Receptor Status: ER(+), PR (+), Her2-neu (neg KI-67 29%)  Did patient present with symptoms (if so, please note symptoms) or was this found on screening mammography?: Routine screening   Past/Anticipated interventions by surgeon, if any: Dr. Lucia Gaskins ,sees Heart Dr. Gabriel Carina Np [facility] [place]  This Thursday , if that clears will set up appt with Dr. Lucia Gaskins for surgery has heart murmur  Past/Anticipated interventions by medical oncology, if any: Chemotherapy:  Dr. Lindi Adie, Oncotype DX testing recommended ,  s Lymphedema issues, if any:  no  Pain issues, if any: legs ,mid and lower back   SAFETY ISSUES:yes,  Unsteady edema b/l lower extremities   Prior radiation? NO  Pacemaker/ICD? NO  Possible current pregnancy? NO  Is the patient on methotrexate? NO  Current Complaints / other details:  Widowed, ,Disabled,  Menarche age 36 GXP5,1st child born age 75, no HRT or birth control pills, menopause age 74,COPD/sleep apnea, HTN,, DM, Bladder suspension x2, Abdominal hysterectomy; 25 years ago,  Former smoker 1ppd x 10 years,quit 03/03/93, never used smokeless tobacco,no alcohol or illicit drugs , Father deceased lung cancer,brother deceased lung cancer, 2 sisters breast cancer , 1 sister just deceased, ,Mother MI,  Allergies: PCNS    Rebecca Eaton, RN 11/17/2014,3:26 PM  BP 146/61 mmHg  Pulse 87  Temp(Src) 98.3 F (36.8 C) (Oral)  Resp 20  Ht $R'5\' 3"'Lt$  (1.6 m)  Wt 302 lb 8 oz (137.213 kg)  BMI 53.60 kg/m2  SpO2 98%  Wt Readings from Last 3 Encounters:  11/20/14 302 lb 8 oz (137.213 kg)  11/13/14 298 lb 14.4 oz (135.58 kg)  10/05/14 293 lb 8 oz (133.131 kg)

## 2014-11-20 ENCOUNTER — Encounter: Payer: Self-pay | Admitting: Radiation Oncology

## 2014-11-20 ENCOUNTER — Ambulatory Visit
Admission: RE | Admit: 2014-11-20 | Discharge: 2014-11-20 | Disposition: A | Discharge status: Home / Self Care | Payer: Medicare Other | Source: Ambulatory Visit | Attending: Radiation Oncology | Admitting: Radiation Oncology

## 2014-11-20 ENCOUNTER — Telehealth: Payer: Self-pay | Admitting: *Deleted

## 2014-11-20 VITALS — BP 146/61 | HR 87 | Temp 98.3°F | Resp 20 | Ht 63.0 in | Wt 302.5 lb

## 2014-11-20 DIAGNOSIS — R011 Cardiac murmur, unspecified: Secondary | ICD-10-CM | POA: Insufficient documentation

## 2014-11-20 DIAGNOSIS — C50412 Malignant neoplasm of upper-outer quadrant of left female breast: Secondary | ICD-10-CM | POA: Diagnosis present

## 2014-11-20 DIAGNOSIS — I10 Essential (primary) hypertension: Secondary | ICD-10-CM | POA: Diagnosis not present

## 2014-11-20 DIAGNOSIS — K5790 Diverticulosis of intestine, part unspecified, without perforation or abscess without bleeding: Secondary | ICD-10-CM | POA: Diagnosis not present

## 2014-11-20 DIAGNOSIS — E785 Hyperlipidemia, unspecified: Secondary | ICD-10-CM | POA: Insufficient documentation

## 2014-11-20 DIAGNOSIS — D649 Anemia, unspecified: Secondary | ICD-10-CM | POA: Insufficient documentation

## 2014-11-20 DIAGNOSIS — A048 Other specified bacterial intestinal infections: Secondary | ICD-10-CM | POA: Insufficient documentation

## 2014-11-20 DIAGNOSIS — I517 Cardiomegaly: Secondary | ICD-10-CM | POA: Insufficient documentation

## 2014-11-20 DIAGNOSIS — J449 Chronic obstructive pulmonary disease, unspecified: Secondary | ICD-10-CM | POA: Insufficient documentation

## 2014-11-20 DIAGNOSIS — E119 Type 2 diabetes mellitus without complications: Secondary | ICD-10-CM | POA: Diagnosis not present

## 2014-11-20 DIAGNOSIS — Z510 Encounter for antineoplastic radiation therapy: Secondary | ICD-10-CM | POA: Insufficient documentation

## 2014-11-20 NOTE — Progress Notes (Signed)
Radiation Oncology         [phone] ________________________________  Name: Kristy Harris MRN: [id number]  Date: 11/20/2014  DOB: 1945-07-02  ON:GEXBMW,UXLKGMW ROBERT, MD  Nicholas Lose, MD     REFERRING PHYSICIAN: Nicholas Lose, MD  DIAGNOSIS: The encounter diagnosis was Breast cancer of upper-outer quadrant of left female breast.  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 female who is seen for an initial consultation visit regarding the patient's diagnosis of breast cancer. The patient was found to have suspicious findings within the left breast on initial mammogram. The patient has not had symptoms prior to this study: found on routine mammogram. She skipped yearly mammograms in 2014 and 2015. A diagnostic mammogram and breast ultrasound confirmed this finding. On ultrasound, the tumor measured 13 mm and was present in the upper outer quadrant.   A biopsy was performed. This revealed moderately differentiated invasive ductal carcinoma [facility]). Receptors studies were completed and indicate that the tumor is estrogen receptor positive, progesterone receptor positive, and Her-2/neu negative. The Ki-67 staining was 29%.  The patient has not undergone an MRI scan of the breasts.  Patient has discussed with Dr.Newman about proceeding with a lumpectomy.    No major changes in past medical history including: diabetes, high blood pressure.   In terms of breast cancer risk profile:  She menarched at early age of 46 and went to menopause at age 77  She had 5 pregnancy, her first child was born at age 67  She has not received birth control pills.  She was never exposed to fertility medications or hormone replacement therapy.  She has family history of Breast cancer Dad died of lung cancer, brother died of lung cancer, sisters had breast cancer unclear age of  onset   PREVIOUS RADIATION THERAPY: No   PAST MEDICAL HISTORY:  has a past medical history of Diabetes mellitus; Morbid obesity; Recurrent boils; Hypertension; Hyperlipidemia; COPD (chronic obstructive pulmonary disease); Enlarged heart; Anemia; Arthritis; Heart murmur; Urinary frequency; Sleep apnea; H. pylori infection; Diverticulosis; Hidradenitis suppurativa; and Breast cancer (11/01/14).     PAST SURGICAL HISTORY: Past Surgical History  Procedure Laterality Date  . Pilonidal cyst excision    . Bladder suspension      x 2  . Abcess drainage    . Cardiac catheterization  02/21/2011    Normal coronary arteries, normal EF  . Tonsillectomy  yrs ago  . Abdominal hysterectomy  25 yrs ago  . Irrigation and debridement abscess Right 12/23/2012    Procedure: incision  AND DEBRIDEMENT right buttock infection ;  Surgeon: Shann Medal, MD;  Location: [facility];  Service: General;  Laterality: Right;  . Hydradenitis excision Left 12/08/2013    Procedure: EXCISION HIDRADENITIS AXILLA;  Surgeon: Alphonsa Overall, MD;  Location: [facility];  Service: General;  Laterality: Left;  . Excision hydradenitis labia N/A 12/08/2013    Procedure: EXCISION HIDRADENITIS PUBIC AREA;  Surgeon: Alphonsa Overall, MD;  Location: [facility];  Service: General;  Laterality: N/A;     FAMILY HISTORY: family history includes Diabetes in her brother and sister; Heart attack in her brother and mother; Lung cancer in her brother and father; Other in her sister.   SOCIAL HISTORY:  reports that she quit smoking about 21 years ago. Her smoking use included Cigarettes. She has a 10 pack-year smoking history. She has never used smokeless tobacco. She reports  that she does not drink alcohol or use illicit drugs.   ALLERGIES: Penicillins   MEDICATIONS:  Current Outpatient Prescriptions  Medication Sig Dispense Refill  . acetaminophen (TYLENOL) 325 MG tablet Take 325-650 mg by mouth every 6 (six) hours as needed for pain.    Marland Kitchen albuterol  (PROVENTIL HFA;VENTOLIN HFA) 108 (90 BASE) MCG/ACT inhaler Inhale 2 puffs into the lungs every 4 (four) hours as needed for shortness of breath.    Marland Kitchen atorvastatin (LIPITOR) 20 MG tablet Take 20 mg by mouth every morning.     . bismuth subsalicylate (PEPTO BISMOL) 262 MG chewable tablet Chew 2 tablets (524 mg total) by mouth 2 (two) times daily. (Patient taking differently: Chew 524 mg by mouth as needed. ) 22 tablet 0  . Cholecalciferol (VITAMIN D-3) 5000 UNITS TABS Take 5,000 Units by mouth every morning.    . famotidine (PEPCID) 20 MG tablet Take 1 tablet (20 mg total) by mouth 2 (two) times daily. 60 tablet 3  . ferrous sulfate 325 (65 FE) MG tablet Take 1 tablet (325 mg total) by mouth every morning. 30 tablet 0  . fluconazole (DIFLUCAN) 100 MG tablet TAKE ONE (1) TABLET EACH DAY    . furosemide (LASIX) 20 MG tablet TAKE ONE TABLET BY MOUTH TWICE DAILY    . losartan (COZAAR) 100 MG tablet Take 100 mg by mouth daily.    . metFORMIN (GLUCOPHAGE-XR) 500 MG 24 hr tablet Take 500 mg by mouth 2 (two) times daily.     . metoprolol (TOPROL-XL) 100 MG 24 hr tablet Take 100 mg by mouth every morning.     . pantoprazole (PROTONIX) 40 MG tablet Take 1 tablet (40 mg total) by mouth daily. 90 tablet 0  . polyethylene glycol (MIRALAX / GLYCOLAX) packet Take 17 g by mouth daily as needed for mild constipation. 14 each 0  . sulfamethoxazole-trimethoprim (BACTRIM,SEPTRA) 400-80 MG per tablet TAKE ONE TABLET BY MOUTH TWICE DAILY    . glipiZIDE (GLUCOTROL XL) 10 MG 24 hr tablet Take 1 tablet by mouth daily.    Marland Kitchen HYDROcodone-acetaminophen (NORCO/VICODIN) 5-325 MG per tablet Take 1 tablet by mouth 2 (two) times daily.     . ondansetron (ZOFRAN) 4 MG tablet Take 1 tablet (4 mg total) by mouth every 6 (six) hours as needed for nausea. (Patient not taking: Reported on 11/20/2014) 20 tablet 0  . verapamil (CALAN) 120 MG tablet Take 120 mg by mouth every morning.      No current facility-administered medications for this  encounter.     REVIEW OF SYSTEMS:  A 15 point review of systems is documented in the electronic medical record. This was obtained by the nursing staff. However, I reviewed this with the patient to discuss relevant findings and make appropriate changes.  Pertinent items are noted in HPI.    PHYSICAL EXAM:  height is $RemoveB'5\' 3"'zPJhCQsk$  (1.6 m) and weight is 302 lb 8 oz (137.213 kg). Her oral temperature is 98.3 F (36.8 C). Her blood pressure is 146/61 and her pulse is 87. Her respiration is 20 and oxygen saturation is 98%.    ECOG = 1  0 - Asymptomatic (Fully active, able to carry on all predisease activities without restriction)  1 - Symptomatic but completely ambulatory (Restricted in physically strenuous activity but ambulatory and able to carry out work of a light or sedentary nature. For example, light housework, office work)  2 - Symptomatic, <50% in bed during the day (Ambulatory and capable of all self  care but unable to carry out any work activities. Up and about more than 50% of waking hours)  3 - Symptomatic, >50% in bed, but not bedbound (Capable of only limited self-care, confined to bed or chair 50% or more of waking hours)  4 - Bedbound (Completely disabled. Cannot carry on any self-care. Totally confined to bed or chair)  5 - Death   Eustace Pen MM, Creech RH, Tormey DC, et al. [phone]). "Toxicity and response criteria of the [facility]". [place] Oncol. 5 (6): 649-55  General: Well-developed, in no acute distress, sitting in wheel chair HEENT: Normocephalic, atraumatic; oral cavity clear Neck: Supple without any lymphadenopathy Cardiovascular: Regular rate and rhythm Respiratory: Clear to auscultation bilaterally Breasts: Palpable 1.5 cm nodule within upper outer quadrant of left breast; otherwise unremarkable GI: Soft, nontender, normal bowel sounds Extremities: No edema present Neuro: No focal deficits  LABORATORY DATA:  Lab Results  Component Value Date    WBC 8.3 06/21/2014   HGB 9.8* 06/21/2014   HCT 30.9* 06/21/2014   MCV 77.4* 06/21/2014   PLT 269.0 06/21/2014   Lab Results  Component Value Date   NA 138 05/28/2014   K 4.4 05/28/2014   CL 102 05/28/2014   CO2 27 05/28/2014   Lab Results  Component Value Date   ALT 21 05/28/2014   AST 30 05/28/2014   ALKPHOS 104 05/28/2014   BILITOT 0.8 05/28/2014      RADIOGRAPHY: No results found.     IMPRESSION:    Breast cancer of upper-outer quadrant of left female breast   11/01/2014 Mammogram Possible mass in the left breast upper outer quadrant measuring 13 mm suspicious for breast cancer confirmed through ultrasound a spiculated hypoechoic mass ill-defined, no enlarged lymph nodes   11/01/2014 Initial Diagnosis Invasive ductal carcinoma, moderately differentiated, ER > 90%, PR> 90%, HER-2 -2+ by IHC, ratio 1.15, KI 67: 29%, T1 cN0 stage IA clinical stage    The patient has a recent diagnosis of invasive ductal carcinoma of the left breast. She appears to be a good candidate for breast conservation treatment.  I discussed with the patient the role of adjuvant radiation treatment in this setting. We discussed the potential benefit of radiation treatment, especially with regards to local control of the patient's tumor. We also discussed the possible side effects and risks of such a treatment as well.  All of the patient's questions were answered. The patient wishes to proceed with radiation treatment at the appropriate time.  PLAN: I look forward to seeing the patient postoperatively to review her case and further discuss and coordinate an anticipated course of radiation treatment.   Patient will follow up with medical oncology and radiation after post-surgery pathology has been finalized. Oncotype test will be ordered by medical oncology.  Patient would like to be treated at [facility], this location is closer to where she lives in [place].      ________________________________   Jodelle Gross, MD, PhD   **Disclaimer: This note was dictated with voice recognition software. Similar sounding words can inadvertently be transcribed and this note may contain transcription errors which may not have been corrected upon publication of note.**  This document serves as a record of services personally performed by Kyung Rudd, MD. It was created on his behalf by Derek Mound, a trained medical scribe. The creation of this record is based on the scribe's personal observations and the provider's statements to them. This document has been checked and approved by  the attending provider.

## 2014-11-20 NOTE — Telephone Encounter (Signed)
Two CDs of breast arrived from [facility] gave to Dr. Lisbeth Renshaw

## 2014-11-23 ENCOUNTER — Encounter: Payer: Self-pay | Admitting: Cardiology

## 2014-11-23 ENCOUNTER — Ambulatory Visit (INDEPENDENT_AMBULATORY_CARE_PROVIDER_SITE_OTHER): Payer: Medicare Other | Admitting: Cardiology

## 2014-11-23 VITALS — BP 150/70 | HR 81 | Ht 63.0 in | Wt 300.0 lb

## 2014-11-23 DIAGNOSIS — Z0389 Encounter for observation for other suspected diseases and conditions ruled out: Secondary | ICD-10-CM

## 2014-11-23 DIAGNOSIS — Z01810 Encounter for preprocedural cardiovascular examination: Secondary | ICD-10-CM | POA: Diagnosis not present

## 2014-11-23 DIAGNOSIS — I350 Nonrheumatic aortic (valve) stenosis: Secondary | ICD-10-CM

## 2014-11-23 DIAGNOSIS — M1990 Unspecified osteoarthritis, unspecified site: Secondary | ICD-10-CM | POA: Insufficient documentation

## 2014-11-23 DIAGNOSIS — IMO0001 Reserved for inherently not codable concepts without codable children: Secondary | ICD-10-CM | POA: Insufficient documentation

## 2014-11-23 DIAGNOSIS — I10 Essential (primary) hypertension: Secondary | ICD-10-CM | POA: Diagnosis not present

## 2014-11-23 DIAGNOSIS — C50412 Malignant neoplasm of upper-outer quadrant of left female breast: Secondary | ICD-10-CM

## 2014-11-23 NOTE — Assessment & Plan Note (Signed)
Pt needs pre op clearance for mastectomy

## 2014-11-23 NOTE — Assessment & Plan Note (Signed)
On oral agents 

## 2014-11-23 NOTE — Assessment & Plan Note (Signed)
Mild on echo 2014- due for repeat

## 2014-11-23 NOTE — Assessment & Plan Note (Signed)
Wheelchair bound 

## 2014-11-23 NOTE — Assessment & Plan Note (Signed)
Controlled.  

## 2014-11-23 NOTE — Progress Notes (Signed)
11/23/2014 Kristy Harris   Nov 16, 1945  [id number]  Primary Physician [name], MD Primary Cardiologist: Dr Percival Spanish  HPI:  69 y/o morbidly obese female who is wheel chair bound secondary to severe DJD. The pt was seen by Dr Percival Spanish in the past. She had a cath in 2012 for pre op clearance which revealed normal coronaries and LVF. She had a radial cath and had problems with pain requiring physical therapy and a course of steroids. On her echo in 2014 she had mild AS and the plan was to re evaluate this in a couple of years. The pt has not been seen since 2014. She recently was diagnosed with Lt breast cancer and is here now for pre op clearance. The pt has no chest pain complaints. She has occasional palpations but says Toprol controls this.     Current Outpatient Prescriptions  Medication Sig Dispense Refill  . acetaminophen (TYLENOL) 325 MG tablet Take 325-650 mg by mouth every 6 (six) hours as needed for pain.    Marland Kitchen albuterol (PROVENTIL HFA;VENTOLIN HFA) 108 (90 BASE) MCG/ACT inhaler Inhale 2 puffs into the lungs every 4 (four) hours as needed for shortness of breath.    Marland Kitchen atorvastatin (LIPITOR) 20 MG tablet Take 20 mg by mouth every morning.     . bismuth subsalicylate (PEPTO BISMOL) 262 MG chewable tablet Chew 2 tablets (524 mg total) by mouth 2 (two) times daily. (Patient taking differently: Chew 524 mg by mouth as needed. ) 22 tablet 0  . Cholecalciferol (VITAMIN D-3) 5000 UNITS TABS Take 5,000 Units by mouth every morning.    . famotidine (PEPCID) 20 MG tablet Take 1 tablet (20 mg total) by mouth 2 (two) times daily. 60 tablet 3  . ferrous sulfate 325 (65 FE) MG tablet Take 1 tablet (325 mg total) by mouth every morning. 30 tablet 0  . fluconazole (DIFLUCAN) 100 MG tablet TAKE ONE (1) TABLET EACH DAY    . furosemide (LASIX) 20 MG tablet TAKE ONE TABLET BY MOUTH TWICE DAILY    . glipiZIDE (GLUCOTROL XL) 10 MG 24 hr tablet Take 1 tablet by mouth daily.    Marland Kitchen  HYDROcodone-acetaminophen (NORCO/VICODIN) 5-325 MG per tablet Take 1 tablet by mouth 2 (two) times daily.     Marland Kitchen losartan (COZAAR) 100 MG tablet Take 100 mg by mouth daily.    . metFORMIN (GLUCOPHAGE-XR) 500 MG 24 hr tablet Take 500 mg by mouth 2 (two) times daily.     . metoprolol (TOPROL-XL) 100 MG 24 hr tablet Take 100 mg by mouth every morning.     . ondansetron (ZOFRAN) 4 MG tablet Take 1 tablet (4 mg total) by mouth every 6 (six) hours as needed for nausea. 20 tablet 0  . pantoprazole (PROTONIX) 40 MG tablet Take 1 tablet (40 mg total) by mouth daily. 90 tablet 0  . polyethylene glycol (MIRALAX / GLYCOLAX) packet Take 17 g by mouth daily as needed for mild constipation. 14 each 0  . sulfamethoxazole-trimethoprim (BACTRIM,SEPTRA) 400-80 MG per tablet TAKE ONE TABLET BY MOUTH TWICE DAILY    . verapamil (CALAN) 120 MG tablet Take 120 mg by mouth every morning.      No current facility-administered medications for this visit.    Allergies  Allergen Reactions  . Penicillins Rash    All over the body    Social History   Social History  . Marital Status: Widowed    Spouse Name: N/A  . Number of Children: 5  .  Years of Education: N/A   Occupational History  . Textile work     Disabled   Social History Main Topics  . Smoking status: Former Smoker -- 1.00 packs/day for 10 years    Types: Cigarettes    Quit date: 03/03/1993  . Smokeless tobacco: Never Used  . Alcohol Use: No  . Drug Use: No  . Sexual Activity: Not on file   Other Topics Concern  . Not on file   Social History Narrative   Lives with grandson.  Widowed.  Has 2 sons and 3 daughters     Review of Systems: General: negative for chills, fever, night sweats or weight changes.  Cardiovascular: negative for chest pain, dyspnea on exertion, edema, orthopnea, palpitations, paroxysmal nocturnal dyspnea or shortness of breath Dermatological: negative for rash Respiratory: negative for cough or wheezing Urologic:  negative for hematuria Abdominal: negative for nausea, vomiting, diarrhea, bright red blood per rectum, melena, or hematemesis Neurologic: negative for visual changes, syncope, or dizziness All other systems reviewed and are otherwise negative except as noted above.    Blood pressure 150/70, pulse 81, height 5\' 3"  (1.6 m), weight 300 lb (136.079 kg).  General appearance: alert, cooperative, no distress, morbidly obese and in wheel chair Neck: no carotid bruit and no JVD Lungs: clear to auscultation bilaterally Heart: regular rate and rhythm and 2/6 systolic murmur AOV, preserved S2 Abdomen: obese Extremities: trace edema Pulses: 2+ and symmetric Skin: cool and dry Neurologic: Grossly normal  EKG NSR, poor anterior RW  ASSESSMENT AND PLAN:   Encounter for pre-operative cardiovascular clearance Pt needs pre op clearance for mastectomy  Aortic stenosis Mild on echo 2014- due for repeat  Normal coronary arteries 2012 cath  Hypertension Controlled  Diabetes mellitus On oral agents  Morbid obesity BMI 53  Breast cancer of upper-outer quadrant of left female breast Needs mastectomy when cleared   DJD (degenerative joint disease) Wheel chair bound   PLAN  Reviewed with Dr Burt Knack in the office today. Plan is for repeat echo. If LVF is normal and AS not severe she is cleared from our standpoint for surgery. We will be available peri op for any cardiac issues.  Kerin Ransom K PA-C 11/23/2014 10:35 AM

## 2014-11-23 NOTE — Assessment & Plan Note (Signed)
BMI 53 

## 2014-11-23 NOTE — Assessment & Plan Note (Signed)
2012 cath

## 2014-11-23 NOTE — Patient Instructions (Signed)
Medication Instructions:  Your physician recommends that you continue on your current medications as directed. Please refer to the Current Medication list given to you today.   Labwork: None ordered  Testing/Procedures: Your physician has requested that you have an echocardiogram. Echocardiography is a painless test that uses sound waves to create images of your heart. It provides your doctor with information about the size and shape of your heart and how well your heart's chambers and valves are working. This procedure takes approximately one hour. There are no restrictions for this procedure. (Please schedule asap)  Follow-Up: Your physician wants you to follow-up in: 6 months with Dr.Hochrein You will receive a reminder letter in the mail two months in advance. If you don't receive a letter, please call our office to schedule the follow-up appointment.   Any Other Special Instructions Will Be Listed Below (If Applicable).

## 2014-11-23 NOTE — Assessment & Plan Note (Signed)
Needs mastectomy when cleared

## 2014-11-24 ENCOUNTER — Ambulatory Visit (HOSPITAL_COMMUNITY): Payer: Medicare Other | Attending: Cardiology

## 2014-11-24 ENCOUNTER — Other Ambulatory Visit: Payer: Self-pay

## 2014-11-24 DIAGNOSIS — I350 Nonrheumatic aortic (valve) stenosis: Secondary | ICD-10-CM | POA: Diagnosis not present

## 2014-11-24 DIAGNOSIS — Z6841 Body Mass Index (BMI) 40.0 and over, adult: Secondary | ICD-10-CM | POA: Diagnosis not present

## 2014-11-24 DIAGNOSIS — I313 Pericardial effusion (noninflammatory): Secondary | ICD-10-CM | POA: Diagnosis not present

## 2014-11-24 DIAGNOSIS — E119 Type 2 diabetes mellitus without complications: Secondary | ICD-10-CM | POA: Insufficient documentation

## 2014-11-24 DIAGNOSIS — Z01810 Encounter for preprocedural cardiovascular examination: Secondary | ICD-10-CM | POA: Diagnosis not present

## 2014-11-24 DIAGNOSIS — I10 Essential (primary) hypertension: Secondary | ICD-10-CM | POA: Insufficient documentation

## 2014-11-24 DIAGNOSIS — E785 Hyperlipidemia, unspecified: Secondary | ICD-10-CM | POA: Insufficient documentation

## 2014-11-24 DIAGNOSIS — I059 Rheumatic mitral valve disease, unspecified: Secondary | ICD-10-CM | POA: Insufficient documentation

## 2014-11-24 DIAGNOSIS — I5189 Other ill-defined heart diseases: Secondary | ICD-10-CM | POA: Insufficient documentation

## 2014-11-28 ENCOUNTER — Other Ambulatory Visit: Payer: Self-pay | Admitting: Surgery

## 2014-11-28 DIAGNOSIS — C50912 Malignant neoplasm of unspecified site of left female breast: Secondary | ICD-10-CM

## 2014-12-02 DIAGNOSIS — Z853 Personal history of malignant neoplasm of breast: Secondary | ICD-10-CM

## 2014-12-02 HISTORY — DX: Personal history of malignant neoplasm of breast: Z85.3

## 2014-12-08 ENCOUNTER — Other Ambulatory Visit: Payer: Self-pay | Admitting: Surgery

## 2014-12-11 ENCOUNTER — Other Ambulatory Visit: Payer: Self-pay | Admitting: Surgery

## 2014-12-11 DIAGNOSIS — C50912 Malignant neoplasm of unspecified site of left female breast: Secondary | ICD-10-CM

## 2014-12-14 ENCOUNTER — Encounter: Payer: Self-pay | Admitting: *Deleted

## 2014-12-15 ENCOUNTER — Telehealth: Payer: Self-pay | Admitting: Hematology and Oncology

## 2014-12-15 NOTE — Telephone Encounter (Signed)
s.w. pt and advised on OCT appt....pt ok and aware °

## 2014-12-18 NOTE — Progress Notes (Signed)
Dr Pollie Friar scheduler Debbie notified that patient's BMI is 53.2 and she is totally wheelchair bound, cannot be done at [place].

## 2014-12-20 ENCOUNTER — Ambulatory Visit
Admission: RE | Admit: 2014-12-20 | Discharge: 2014-12-20 | Disposition: A | Discharge status: Home / Self Care | Payer: Medicare Other | Source: Ambulatory Visit | Attending: Surgery | Admitting: Surgery

## 2014-12-20 ENCOUNTER — Encounter (HOSPITAL_COMMUNITY)
Admission: RE | Admit: 2014-12-20 | Discharge: 2014-12-20 | Disposition: A | Discharge status: Home / Self Care | Payer: Medicare Other | Source: Ambulatory Visit | Attending: Surgery | Admitting: Surgery

## 2014-12-20 ENCOUNTER — Encounter (HOSPITAL_COMMUNITY): Payer: Self-pay

## 2014-12-20 DIAGNOSIS — Z79899 Other long term (current) drug therapy: Secondary | ICD-10-CM | POA: Diagnosis not present

## 2014-12-20 DIAGNOSIS — Z01818 Encounter for other preprocedural examination: Secondary | ICD-10-CM | POA: Insufficient documentation

## 2014-12-20 DIAGNOSIS — Z87891 Personal history of nicotine dependence: Secondary | ICD-10-CM | POA: Diagnosis not present

## 2014-12-20 DIAGNOSIS — Z7984 Long term (current) use of oral hypoglycemic drugs: Secondary | ICD-10-CM | POA: Insufficient documentation

## 2014-12-20 DIAGNOSIS — C50912 Malignant neoplasm of unspecified site of left female breast: Secondary | ICD-10-CM | POA: Diagnosis not present

## 2014-12-20 DIAGNOSIS — E119 Type 2 diabetes mellitus without complications: Secondary | ICD-10-CM | POA: Insufficient documentation

## 2014-12-20 DIAGNOSIS — J449 Chronic obstructive pulmonary disease, unspecified: Secondary | ICD-10-CM | POA: Insufficient documentation

## 2014-12-20 DIAGNOSIS — Z01812 Encounter for preprocedural laboratory examination: Secondary | ICD-10-CM | POA: Diagnosis not present

## 2014-12-20 DIAGNOSIS — G4733 Obstructive sleep apnea (adult) (pediatric): Secondary | ICD-10-CM | POA: Diagnosis not present

## 2014-12-20 DIAGNOSIS — I10 Essential (primary) hypertension: Secondary | ICD-10-CM | POA: Insufficient documentation

## 2014-12-20 LAB — BASIC METABOLIC PANEL
Anion gap: 11 (ref 5–15)
BUN: 11 mg/dL (ref 6–20)
CALCIUM: 9.4 mg/dL (ref 8.9–10.3)
CO2: 27 mmol/L (ref 22–32)
CREATININE: 0.76 mg/dL (ref 0.44–1.00)
Chloride: 100 mmol/L — ABNORMAL LOW (ref 101–111)
GFR calc Af Amer: >60 mL/min (ref >60)
GLUCOSE: 118 mg/dL — AB (ref 65–99)
Potassium: 4.3 mmol/L (ref 3.5–5.1)
SODIUM: 138 mmol/L (ref 135–145)

## 2014-12-20 LAB — GLUCOSE, CAPILLARY: Glucose-Capillary: 106 mg/dL — ABNORMAL HIGH (ref 65–99)

## 2014-12-20 LAB — CBC
HEMATOCRIT: 37.6 % (ref 36.0–46.0)
Hemoglobin: 11.7 g/dL — ABNORMAL LOW (ref 12.0–15.0)
MCH: 25.7 pg — AB (ref 26.0–34.0)
MCHC: 31.1 g/dL (ref 30.0–36.0)
MCV: 82.6 fL (ref 78.0–100.0)
Platelets: 239 10*3/uL (ref 150–400)
RBC: 4.55 MIL/uL (ref 3.87–5.11)
RDW: 16.3 % — ABNORMAL HIGH (ref 11.5–15.5)
WBC: 5.9 10*3/uL (ref 4.0–10.5)

## 2014-12-20 MED ORDER — CHLORHEXIDINE GLUCONATE 4 % EX LIQD: 1.0000 "application " | Freq: Once | CUTANEOUS | Status: DC

## 2014-12-20 NOTE — Pre-Procedure Instructions (Signed)
ZHANIYA SWALLOWS  12/20/2014      [id number] [place], [place] [address] [place] [address] Phone: [phone] Fax: [phone]  [place], [place] [address] Phone: [phone] Fax: [phone]    Your procedure is scheduled on December 25, 2014  Report to [facility] Admitting at 12:30 PM  Call this number if you have problems the morning of surgery:  [phone]   Remember:  Do not eat food or drink liquids after midnight.  Take these medicines the morning of surgery with A SIP OF WATER :  sulfamethoxazole-trimethoprim (BACTRIM,SEPTRA),  fluconazole (DIFLUCAN),   metoprolol (TOPROL-XL), pantoprazole (PROTONIX), if needed: ondansetron (ZOFRAN), HYDROcodone-acetaminophen (NORCO/VICODIN), albuterol  (PROVENTIL HFA;VENTOLIN HFA)   STOP ASPIRIN, HERBAL MEDICATIONS, NSAIDS (ADVILE, IBUPROFEN) ONE WEEK PRIOR TO SURGERY   How to Manage Your Diabetes Before Surgery   Why is it important to control my blood sugar before and after surgery?   Improving blood sugar levels before and after surgery helps healing and can limit problems.  A way of improving blood sugar control is eating a healthy diet by:  - Eating less sugar and carbohydrates  - Increasing activity/exercise  - Talk with your doctor about reaching your blood sugar goals  High blood sugars (greater than 180 mg/dL) can raise your risk of infections and slow down your recovery so you will need to focus on controlling your diabetes during the weeks before surgery.  Make sure that the doctor who takes care of your diabetes knows about your planned surgery including the date and location.  How do I manage my blood sugars before surgery?   Check your blood sugar at least 4 times a day, 2 days before surgery to make sure that they are not too high or low.   Check your blood sugar the morning of your  surgery when you wake up and every 2               hours until you get to the Short-Stay unit.  If your blood sugar is less than 70 mg/dL, you will need to treat for low blood sugar by:  Treat a low blood sugar (less than 70 mg/dL) with 1/2 cup of clear juice (cranberry or apple), 4 glucose tablets, OR glucose gel.  Recheck blood sugar in 15 minutes after treatment (to make sure it is greater than 70 mg/dL).  If blood sugar is not greater than 70 mg/dL on re-check, call [phone] for further instructions.   Report your blood sugar to the Short-Stay nurse when you get to Short-Stay.  References:  [facility], 2007 "How to Manage your Diabetes Before and After Surgery".  What do I do about my diabetes medications?   Do not take oral diabetes medicines (pills) the morning of surgery.     Do not wear jewelry, make-up or nail polish.  Do not wear lotions, powders, or perfumes.  You may wear deodorant.  Do not shave 48 hours prior to surgery.  Men may shave face and neck.  Do not bring valuables to the hospital.  [facility] is not responsible for any belongings or valuables.  Contacts, dentures or bridgework may not be worn into surgery.  Leave your suitcase in the car.  After surgery it may be brought to your room.  For patients admitted to the hospital, discharge time will  be determined by your treatment team.  Patients discharged the day of surgery will not be allowed to drive home.   Name and phone number of your driver:    Special instructions:  [place]  Please read over the following fact sheets that you were given. Pain Booklet, Coughing and Deep Breathing and Surgical Site Infection Prevention

## 2014-12-21 ENCOUNTER — Encounter (HOSPITAL_COMMUNITY): Payer: Self-pay

## 2014-12-21 LAB — HEMOGLOBIN A1C
HEMOGLOBIN A1C: 7.8 % — AB (ref 4.8–5.6)
Mean Plasma Glucose: 177 mg/dL

## 2014-12-21 NOTE — Progress Notes (Signed)
Anesthesia Chart Review: Patient is a 69 year old female scheduled for left breast lumpectomy (seed localization), left axillary SN biopsy on 12/25/14 by Dr. Alphonsa Overall.  History includes left breast cancer, former smoker, post-operative N/V, DM2, morbid HTN, COPD, anemia, "enlarged heart" (11/24/14 echo showed normal LV cavity size and wall thickness), OSA (no CPAP since weight loss), hidradenitis suppurativa, normal coronaries in 2012, mild AS 11/2014. BMI is consistent with super morbid obesity. PCP is Dr. Dione Housekeeper. HEM-ONC is Dr. Lindi Adie. Cardiologist is Dr. Percival Spanish. She was seen by Kerin Ransom, PA-C for preoperative clearance and cleared after echo showed preserved LVE and stable mild AS.  Meds include albuterol, Lipitor, Pepcid, 65 Fe, Lasix, Diflucan, glipizide, Norco, losartan, metformin, Toprol XL, Protonix, Bactrim, verapamil.  11/24/14 Echo: Normal LV systolic function with LVEF 21-30%; grade 1 diastolic dysfunction; with elevated LV filling pressure; mild LAE; calcified aortic valve with mild AS (mean gradient 18 mmHg); trace MR.  11/23/14 EKG: NSR, cannot rule out anterior infarct (age undetermined).   02/21/11 Cardiac cath (done following abnormal stress test showing small, reversible apical defect and medium reversible mid to basal lateral defect on 02/03/11 at [facility], scanned under Media tab): Conclusions:  1. Smooth and normal coronary arteries 2. Normal left ventricular systolic function.  Preoperative labs noted. A1C 7.8.   If no acute changes then I anticipate that she can proceed as planned.  George Hugh [facility] Short Stay Center/Anesthesiology Phone [phone] 12/21/2014 1:57 PM

## 2014-12-24 NOTE — H&P (Signed)
Kristy Harris. [facility]  Location: [facility] Surgery Patient #: [id number] DOB: January 06, 1946 Widowed / Language: [place] / Race: Black or African American Female  History of Present Illness   The patient is a 69 year old female who presents with breast cancer.   Her PCP is Dr. Alger Harris.  Oncology - Kristy Harris. She sees Dr. Lisbeth Harris on Monday, 11/20/2014.  She is accompanied by her daughter, Kristy Harris.   [name] comes with a new problem. She had an US guided left breast biopsy on 11/01/2014 through [facility]. The mass is in the UOQ of the left breast and measures 1.3 cm. The path report ([id number]) - well differentiated IDC. The tumor is ER - 90%, PR - 905, Ki67 - 29%, and HER2Neu - negative  She has a GM and 2 aunts on her mother's side who had breast cnacer. her sister just had breast cancer surgery by Dr. Ninfa Harris about 3 months ago. Her last mammogram was about 3 years ago. The hidradinitis interferred with her mammograms. She is not on hormone therapy.   I discussed the options for breast cancer treatment with the patient. I discussed a multidisciplinary approach to the treatment of breast cancer, which includes medical oncology and radiation oncology. I discussed the surgical options of lumpectomy vs. mastectomy. If mastectomy, there is the possibility of reconstruction. I discussed the options of lymph node biopsy. The treatment plan depends on the pathologic staging of the tumor and the patient's personal wishes.  The risks of surgery include, but are not limited to, bleeding, infection, the need for further surgery, and nerve injury.  Other medical problems: 1. Abscess, both buttocks. Left axilla and left groin. Hidradinitis. Excision of right buttocks infection - 12/23/2012 - Kristy Harris Excision of left axilla and left groins hidradinitis on 12/08/2013 by Dr. Keturah Harris. Kristy Harris She is still having some issues with her right groin  2. Morbid  obesity - BMI 46.8 She has lost 20 pounds since 06/18/2011 We have talked about this is a big cause of her recurrent infections. She had a peak weight of 331, so she is down in weight, but has stalled around 270. 3. Diabetes mellitus Not on insulin. She said that her last BS was 96. 4. Using a cane to walk. Limited mobility. 5. Aortic stenosis, mild Cardiac nuclear scan - 02/02/2014 - EF 58% [place] - 02/21/2011 Card clearance from Dr. Marigene Harris on 11/13/2012 - but she has not really seen cardiology since then. As best she can tell, she has no chronic follow up with cardiology. 6. Chronic lower extremity edema. 7. GI bleed secondary to diverticulosis - March 2016 She has stopped her aspirin and has not restarted it. She has been seen by Dr. Doretha Harris. 8. She takes vicodin jsut about daily  Social History: Unmarried. Grandson lives with her at night. Daughter, Kristy Harris, is with patient. She also has a and granddaughter, Kristy Harris.   Allergies Elbert Ewings, [place]; 11/16/2014 1:32 PM) Penicillins  Medication History Elbert Ewings, [place]; 11/16/2014 1:36 PM) Verapamil HCl ER (120MG  Tablet ER, Oral) Active. Protonix (40MG  Tablet DR, Oral) Active. Zofran (4MG  Tablet, Oral every six hours) Active. Metoprolol Succinate ER (100MG  Tablet ER 24HR, Oral) Active. MetFORMIN HCl ER (MOD) (500MG  Tablet ER 24HR, Oral) Active. Losartan Potassium (100MG  Tablet, Oral) Active. GlipiZIDE ER (10MG  Tablet ER 24HR, Oral) Active. Famotidine (20MG  Tablet, Oral) Active. Ferrous Fumarate (325MG  Capsule, Oral) Active. Cholecalciferol (5000UNIT Tablet, Oral) Active. Pepto-Bismol (262MG  Tablet, Oral) Active. Atorvastatin Calcium (20MG  Tablet, Oral) Active. Albuterol Sulfate (108 (  90 Base)MCG/ACT Aero Pow Br Act, Inhalation) Active. Tylenol Allergy Sinus (500-2-30MG  Capsule, Oral) Active. Medications Reconciled  Review of Systems Kristy Harris; 11/16/2014  2:08 PM) General Not Present- Appetite Loss, Chills, Fatigue, Fever, Night Sweats, Weight Gain and Weight Loss. Skin Not Present- Change in Wart/Mole, Dryness, Hives, Jaundice, New Lesions, Non-Healing Wounds, Rash and Ulcer. HEENT Present- Wears glasses/contact lenses. Not Present- Earache, Hearing Loss, Hoarseness, Nose Bleed, Oral Ulcers, Ringing in the Ears, Seasonal Allergies, Sinus Pain, Sore Throat, Visual Disturbances and Yellow Eyes. Respiratory Present- Snoring. Not Present- Bloody sputum, Chronic Cough, Difficulty Breathing and Wheezing. Breast Not Present- Breast Mass, Breast Pain, Nipple Discharge and Skin Changes. Cardiovascular Present- Difficulty Breathing Lying Down, Rapid Heart Rate and Shortness of Breath. Not Present- Chest Pain, Leg Cramps, Palpitations and Swelling of Extremities. Gastrointestinal Not Present- Abdominal Pain, Bloating, Bloody Stool, Change in Bowel Habits, Chronic diarrhea, Constipation, Difficulty Swallowing, Excessive gas, Gets full quickly at meals, Hemorrhoids, Indigestion, Nausea, Rectal Pain and Vomiting. Female Genitourinary Not Present- Frequency, Nocturia, Painful Urination, Pelvic Pain and Urgency. Musculoskeletal Present- Back Pain, Joint Pain and Joint Stiffness. Not Present- Muscle Pain, Muscle Weakness and Swelling of Extremities. Neurological Present- Trouble walking. Not Present- Decreased Memory, Fainting, Headaches, Numbness, Seizures, Tingling, Tremor and Weakness. Psychiatric Not Present- Anxiety, Bipolar, Change in Sleep Pattern, Depression, Fearful and Frequent crying. Endocrine Not Present- Cold Intolerance, Excessive Hunger, Hair Changes, Heat Intolerance, Hot flashes and New Diabetes. Hematology Not Present- Easy Bruising, Excessive bleeding, Gland problems, HIV and Persistent Infections.  Vitals Elbert Ewings CMA; 11/16/2014 1:37 PM) 11/16/2014 1:36 PM Weight: 300.6 lb Height: 63in Body Surface Area: 2.46 m Body Mass Index: 53.25  kg/m  Temp.: 97.30F(Temporal)  Pulse: 84 (Regular)  BP: 142/82 (Sitting, Left Arm, Standard)   Physical Exam  General: Obese AA F alert. She needs some help getting on the exam table and has some trouble laying flat. HEENT: Normal. Pupils equal.  Neck: Supple. No mass. No thyroid mass. Lymph Nodes: No supraclavicular or cervical nodes.  Lungs: Clear to auscultation and symmetric breath sounds. Heart: RRR. III/VI systolic murmur  Breast: Right - no mass  Left - nodule at 3 o'clock. i am not sure if this is the tumor.  Left Axilla - the wound is healed except for one spot of granulation tissue  Abdomen: Soft. No mass. No tenderness. No hernia. Normal bowel sounds. Obese.   Left groin - the wound is healed except for one spot of granulation tissue  Buttocks: I looked at her left buttocks. She has an area of chronic infection/inflammation, but it is under good control right now. She does not want anything done to this.  Neurologic: Grossly intact to motor and sensory function.   Assessment & Plan  1.  BREAST CANCER, STAGE 1, LEFT (C50.912)  Story: Biopsy 11/01/2014 at [facility] - path report [id number]) - well differentiated IDC. The tumor is ER - 90%, PR - 905, Ki67 - 29%, and HER2Neu - negative  Impression: Plan:   1) Cardiac clearance   2) left breast lumpectomy (seed loc), left axillary SLNbx She was presented at [facility] 11/22/2014   2. Aortic stenosis, mild  Cardiac nuclear scan - 02/02/2014 - EF 58%  [place] - 02/21/2011  Reviewed with Dr Burt Knack in the office today. Plan is for repeat echo. If LVF is normal and AS not severe she is cleared from our standpoint for surgery. We will be available peri op for any cardiac issues. From [facility] -  11/23/2014 Echo - 11/24/2014 - EF - 55-60%  3.  HIDRADENITIS  - axilla and buttocks  Excision of right buttocks infection - 12/23/2012 - D. Tandrea Kommer  Excision of left axilla and left groins  hidradinitis on 12/08/2013 by Dr. Keturah Harris. Asencion Guisinger  Photos in [employer] while these areas were healing - 02/09/2014  She is still having some issues with her right groin  4. Morbid obesity - BMI 46.8  She has lost 20 pounds since 06/18/2011  We have talked about this is a big cause of her recurrent infections. She had a peak weight of 331, so she is down in weight, but has stalled around 270. 5. Diabetes mellitus  Not on insulin.  She said that her last BS was 96. 6. Using a cane to walk. Limited mobility.  7. Chronic lower extremity edema. 8. GI bleed secondary to diverticulosis - March 2016  She has stopped her aspirin and has not restarted it.  She has been seen by Dr. Doretha Harris. 9. She takes vicodin jsut about daily  Alphonsa Overall, Harris, [facility] Surgery Pager: [phone] Office phone:  [phone]

## 2014-12-25 ENCOUNTER — Ambulatory Visit (HOSPITAL_COMMUNITY)
Admission: RE | Admit: 2014-12-25 | Discharge: 2014-12-25 | Disposition: A | Discharge status: Home / Self Care | Payer: Medicare Other | Source: Ambulatory Visit | Attending: Surgery | Admitting: Surgery

## 2014-12-25 ENCOUNTER — Ambulatory Visit (HOSPITAL_COMMUNITY): Payer: Medicare Other | Admitting: Vascular Surgery

## 2014-12-25 ENCOUNTER — Ambulatory Visit (HOSPITAL_COMMUNITY): Payer: Medicare Other | Admitting: Certified Registered Nurse Anesthetist

## 2014-12-25 ENCOUNTER — Ambulatory Visit
Admission: RE | Admit: 2014-12-25 | Discharge: 2014-12-25 | Disposition: A | Discharge status: Home / Self Care | Payer: Medicare Other | Source: Ambulatory Visit | Attending: Surgery | Admitting: Surgery

## 2014-12-25 ENCOUNTER — Encounter (HOSPITAL_COMMUNITY)
Admission: RE | Disposition: A | Discharge status: Home / Self Care | Payer: Self-pay | Source: Ambulatory Visit | Attending: Surgery

## 2014-12-25 ENCOUNTER — Encounter (HOSPITAL_COMMUNITY): Payer: Self-pay | Admitting: Surgery

## 2014-12-25 DIAGNOSIS — D0512 Intraductal carcinoma in situ of left breast: Secondary | ICD-10-CM | POA: Insufficient documentation

## 2014-12-25 DIAGNOSIS — Z6841 Body Mass Index (BMI) 40.0 and over, adult: Secondary | ICD-10-CM | POA: Diagnosis not present

## 2014-12-25 DIAGNOSIS — Z7984 Long term (current) use of oral hypoglycemic drugs: Secondary | ICD-10-CM | POA: Insufficient documentation

## 2014-12-25 DIAGNOSIS — C50912 Malignant neoplasm of unspecified site of left female breast: Secondary | ICD-10-CM | POA: Diagnosis present

## 2014-12-25 DIAGNOSIS — I10 Essential (primary) hypertension: Secondary | ICD-10-CM | POA: Diagnosis not present

## 2014-12-25 DIAGNOSIS — J449 Chronic obstructive pulmonary disease, unspecified: Secondary | ICD-10-CM | POA: Insufficient documentation

## 2014-12-25 DIAGNOSIS — Z87891 Personal history of nicotine dependence: Secondary | ICD-10-CM | POA: Diagnosis not present

## 2014-12-25 DIAGNOSIS — Z993 Dependence on wheelchair: Secondary | ICD-10-CM | POA: Diagnosis not present

## 2014-12-25 DIAGNOSIS — L0231 Cutaneous abscess of buttock: Secondary | ICD-10-CM | POA: Insufficient documentation

## 2014-12-25 DIAGNOSIS — E119 Type 2 diabetes mellitus without complications: Secondary | ICD-10-CM | POA: Diagnosis not present

## 2014-12-25 DIAGNOSIS — I350 Nonrheumatic aortic (valve) stenosis: Secondary | ICD-10-CM | POA: Diagnosis not present

## 2014-12-25 DIAGNOSIS — L732 Hidradenitis suppurativa: Secondary | ICD-10-CM | POA: Insufficient documentation

## 2014-12-25 DIAGNOSIS — Z803 Family history of malignant neoplasm of breast: Secondary | ICD-10-CM | POA: Diagnosis not present

## 2014-12-25 HISTORY — PX: BREAST LUMPECTOMY: SHX2

## 2014-12-25 HISTORY — PX: BREAST LUMPECTOMY WITH RADIOACTIVE SEED AND SENTINEL LYMPH NODE BIOPSY: SHX6550

## 2014-12-25 LAB — GLUCOSE, CAPILLARY
GLUCOSE-CAPILLARY: 99 mg/dL (ref 65–99)
GLUCOSE-CAPILLARY: 90 mg/dL (ref 65–99)
GLUCOSE-CAPILLARY: 133 mg/dL — AB (ref 65–99)

## 2014-12-25 SURGERY — BREAST LUMPECTOMY WITH RADIOACTIVE SEED AND SENTINEL LYMPH NODE BIOPSY
Anesthesia: Regional | Site: Breast | Laterality: Left

## 2014-12-25 MED ORDER — MIDAZOLAM HCL 5 MG/5ML IJ SOLN
INTRAMUSCULAR | Status: DC | PRN
  Administered 2014-12-25: 2 mg via INTRAVENOUS

## 2014-12-25 MED ORDER — HYDROMORPHONE HCL 1 MG/ML IJ SOLN
INTRAMUSCULAR | Status: AC
  Filled 2014-12-25: qty 1

## 2014-12-25 MED ORDER — GLYCOPYRROLATE 0.2 MG/ML IJ SOLN
INTRAMUSCULAR | Status: DC | PRN
  Administered 2014-12-25: 0.4 mg via INTRAVENOUS

## 2014-12-25 MED ORDER — LACTATED RINGERS IV SOLN
INTRAVENOUS | Status: DC
  Administered 2014-12-25 (×3): via INTRAVENOUS

## 2014-12-25 MED ORDER — METHYLENE BLUE 1 % INJ SOLN
INTRAMUSCULAR | Status: AC
  Filled 2014-12-25: qty 10

## 2014-12-25 MED ORDER — SODIUM CHLORIDE 0.9 % IJ SOLN
INTRAMUSCULAR | Status: AC
  Filled 2014-12-25: qty 10

## 2014-12-25 MED ORDER — CIPROFLOXACIN IN D5W 400 MG/200ML IV SOLN
400.0000 mg | Freq: Two times a day (BID) | INTRAVENOUS | Status: DC
  Administered 2014-12-25: 400 mg via INTRAVENOUS
  Filled 2014-12-25: qty 200

## 2014-12-25 MED ORDER — LACTATED RINGERS IV SOLN: INTRAVENOUS | Status: DC

## 2014-12-25 MED ORDER — BUPIVACAINE-EPINEPHRINE (PF) 0.25% -1:200000 IJ SOLN
INTRAMUSCULAR | Status: AC
  Filled 2014-12-25: qty 30

## 2014-12-25 MED ORDER — DEXAMETHASONE SODIUM PHOSPHATE 4 MG/ML IJ SOLN
INTRAMUSCULAR | Status: DC | PRN
  Administered 2014-12-25: 4 mg via INTRAVENOUS

## 2014-12-25 MED ORDER — ONDANSETRON HCL 4 MG/2ML IJ SOLN
INTRAMUSCULAR | Status: DC | PRN
  Administered 2014-12-25: 4 mg via INTRAVENOUS

## 2014-12-25 MED ORDER — PROMETHAZINE HCL 25 MG/ML IJ SOLN: 6.2500 mg | INTRAMUSCULAR | Status: DC | PRN

## 2014-12-25 MED ORDER — FENTANYL CITRATE (PF) 100 MCG/2ML IJ SOLN
INTRAMUSCULAR | Status: DC | PRN
  Administered 2014-12-25: 50 ug via INTRAVENOUS

## 2014-12-25 MED ORDER — LIDOCAINE HCL (CARDIAC) 20 MG/ML IV SOLN
INTRAVENOUS | Status: DC | PRN
  Administered 2014-12-25: 80 mg via INTRAVENOUS

## 2014-12-25 MED ORDER — MIDAZOLAM HCL 2 MG/2ML IJ SOLN
INTRAMUSCULAR | Status: AC
  Filled 2014-12-25: qty 4

## 2014-12-25 MED ORDER — 0.9 % SODIUM CHLORIDE (POUR BTL) OPTIME
TOPICAL | Status: DC | PRN
  Administered 2014-12-25: 1000 mL

## 2014-12-25 MED ORDER — MEPERIDINE HCL 25 MG/ML IJ SOLN: 6.2500 mg | INTRAMUSCULAR | Status: DC | PRN

## 2014-12-25 MED ORDER — PROPOFOL 10 MG/ML IV BOLUS
INTRAVENOUS | Status: AC
  Filled 2014-12-25: qty 20

## 2014-12-25 MED ORDER — FENTANYL CITRATE (PF) 100 MCG/2ML IJ SOLN
INTRAMUSCULAR | Status: AC
  Administered 2014-12-25: 100 ug
  Filled 2014-12-25: qty 2

## 2014-12-25 MED ORDER — HYDROMORPHONE HCL 1 MG/ML IJ SOLN
0.2500 mg | INTRAMUSCULAR | Status: DC | PRN
  Administered 2014-12-25 (×2): 0.5 mg via INTRAVENOUS

## 2014-12-25 MED ORDER — PROPOFOL 10 MG/ML IV BOLUS
INTRAVENOUS | Status: DC | PRN
  Administered 2014-12-25: 140 mg via INTRAVENOUS

## 2014-12-25 MED ORDER — BUPIVACAINE-EPINEPHRINE (PF) 0.5% -1:200000 IJ SOLN
INTRAMUSCULAR | Status: DC | PRN
  Administered 2014-12-25: 5 mL

## 2014-12-25 MED ORDER — FENTANYL CITRATE (PF) 250 MCG/5ML IJ SOLN
INTRAMUSCULAR | Status: AC
  Filled 2014-12-25: qty 5

## 2014-12-25 MED ORDER — ROCURONIUM BROMIDE 100 MG/10ML IV SOLN
INTRAVENOUS | Status: DC | PRN
  Administered 2014-12-25: 30 mg via INTRAVENOUS

## 2014-12-25 MED ORDER — BUPIVACAINE-EPINEPHRINE 0.25% -1:200000 IJ SOLN
INTRAMUSCULAR | Status: DC | PRN
  Administered 2014-12-25: 30 mL

## 2014-12-25 MED ORDER — TECHNETIUM TC 99M SULFUR COLLOID FILTERED
1.0000 | Freq: Once | INTRAVENOUS | Status: AC | PRN
  Administered 2014-12-25: 1 via INTRADERMAL

## 2014-12-25 MED ORDER — MIDAZOLAM HCL 2 MG/2ML IJ SOLN
INTRAMUSCULAR | Status: AC
  Administered 2014-12-25: 2 mg
  Filled 2014-12-25: qty 2

## 2014-12-25 MED ORDER — NEOSTIGMINE METHYLSULFATE 10 MG/10ML IV SOLN
INTRAVENOUS | Status: DC | PRN
  Administered 2014-12-25: 3 mg via INTRAVENOUS

## 2014-12-25 SURGICAL SUPPLY — 47 items
APPLIER CLIP 9.375 MED OPEN (MISCELLANEOUS) ×3
BINDER BREAST LRG (GAUZE/BANDAGES/DRESSINGS) IMPLANT
BINDER BREAST XLRG (GAUZE/BANDAGES/DRESSINGS) ×3 IMPLANT
BLADE SURG 15 STRL LF DISP TIS (BLADE) ×1 IMPLANT
BLADE SURG 15 STRL SS (BLADE) ×2
CANISTER SUCTION 2500CC (MISCELLANEOUS) ×3 IMPLANT
CHLORAPREP W/TINT 26ML (MISCELLANEOUS) ×3 IMPLANT
CLIP APPLIE 9.375 MED OPEN (MISCELLANEOUS) ×1 IMPLANT
CLIP TI WIDE RED SMALL 6 (CLIP) ×3 IMPLANT
CONT SPEC 4OZ CLIKSEAL STRL BL (MISCELLANEOUS) ×9 IMPLANT
COVER PROBE W GEL 5X96 (DRAPES) ×3 IMPLANT
COVER SURGICAL LIGHT HANDLE (MISCELLANEOUS) ×3 IMPLANT
DRAPE CHEST BREAST 15X10 FENES (DRAPES) IMPLANT
DRAPE LAPAROSCOPIC ABDOMINAL (DRAPES) ×3 IMPLANT
DRAPE UTILITY W/TAPE 26X15 (DRAPES) IMPLANT
DRAPE UTILITY XL STRL (DRAPES) ×3 IMPLANT
ELECT CAUTERY BLADE 6.4 (BLADE) ×3 IMPLANT
ELECT REM PT RETURN 9FT ADLT (ELECTROSURGICAL) ×3
ELECTRODE REM PT RTRN 9FT ADLT (ELECTROSURGICAL) ×1 IMPLANT
GAUZE SPONGE 4X4 12PLY STRL (GAUZE/BANDAGES/DRESSINGS) ×3 IMPLANT
GLOVE BIOGEL PI IND STRL 7.5 (GLOVE) ×1 IMPLANT
GLOVE BIOGEL PI INDICATOR 7.5 (GLOVE) ×2
GLOVE SURG SIGNA 7.5 PF LTX (GLOVE) ×6 IMPLANT
GLOVE SURG SS PI 7.0 STRL IVOR (GLOVE) ×3 IMPLANT
GOWN STRL REUS W/ TWL LRG LVL3 (GOWN DISPOSABLE) ×1 IMPLANT
GOWN STRL REUS W/ TWL XL LVL3 (GOWN DISPOSABLE) ×1 IMPLANT
GOWN STRL REUS W/TWL LRG LVL3 (GOWN DISPOSABLE) ×2
GOWN STRL REUS W/TWL XL LVL3 (GOWN DISPOSABLE) ×2
KIT BASIN OR (CUSTOM PROCEDURE TRAY) ×3 IMPLANT
KIT MARKER MARGIN INK (KITS) ×3 IMPLANT
LIQUID BAND (GAUZE/BANDAGES/DRESSINGS) ×3 IMPLANT
NDL SAFETY ECLIPSE 18X1.5 (NEEDLE) IMPLANT
NEEDLE HYPO 18GX1.5 SHARP (NEEDLE)
NEEDLE HYPO 25X1 1.5 SAFETY (NEEDLE) ×3 IMPLANT
NS IRRIG 1000ML POUR BTL (IV SOLUTION) ×3 IMPLANT
PACK SURGICAL SETUP 50X90 (CUSTOM PROCEDURE TRAY) ×3 IMPLANT
PENCIL BUTTON HOLSTER BLD 10FT (ELECTRODE) ×3 IMPLANT
SPONGE LAP 18X18 X RAY DECT (DISPOSABLE) ×3 IMPLANT
SUT MNCRL AB 4-0 PS2 18 (SUTURE) ×6 IMPLANT
SUT VIC AB 3-0 SH 18 (SUTURE) ×3 IMPLANT
SYR BULB 3OZ (MISCELLANEOUS) ×3 IMPLANT
SYR CONTROL 10ML LL (SYRINGE) ×3 IMPLANT
TOWEL OR 17X24 6PK STRL BLUE (TOWEL DISPOSABLE) ×3 IMPLANT
TOWEL OR 17X26 10 PK STRL BLUE (TOWEL DISPOSABLE) IMPLANT
TUBE CONNECTING 12'X1/4 (SUCTIONS) ×1
TUBE CONNECTING 12X1/4 (SUCTIONS) ×2 IMPLANT
YANKAUER SUCT BULB TIP NO VENT (SUCTIONS) ×3 IMPLANT

## 2014-12-25 NOTE — Anesthesia Preprocedure Evaluation (Addendum)
Anesthesia Evaluation  Patient identified by MRN, date of birth, ID band Patient awake    Reviewed: Allergy & Precautions, NPO status , Patient's Chart, lab work & pertinent test results, reviewed documented beta blocker date and time   History of Anesthesia Complications (+) PONV  Airway Mallampati: II  TM Distance: >3 FB Neck ROM: Full    Dental  (+) Teeth Intact   Pulmonary sleep apnea , COPD, former smoker,    breath sounds clear to auscultation       Cardiovascular hypertension, Pt. on medications and Pt. on home beta blockers + Peripheral Vascular Disease   Rhythm:Regular Rate:Normal     Neuro/Psych negative neurological ROS  negative psych ROS   GI/Hepatic GERD  Medicated,  Endo/Other  diabetes, Type 2, Oral Hypoglycemic Agents  Renal/GU   negative genitourinary   Musculoskeletal  (+) Arthritis , Osteoarthritis,    Abdominal   Peds negative pediatric ROS (+)  Hematology negative hematology ROS (+)   Anesthesia Other Findings   Reproductive/Obstetrics negative OB ROS                           Lab Results  Component Value Date   WBC 5.9 12/20/2014   HGB 11.7* 12/20/2014   HCT 37.6 12/20/2014   MCV 82.6 12/20/2014   PLT 239 12/20/2014   Lab Results  Component Value Date   CREATININE 0.76 12/20/2014   BUN 11 12/20/2014   NA 138 12/20/2014   K 4.3 12/20/2014   CL 100* 12/20/2014   CO2 27 12/20/2014   No results found for: INR, PROTIME  EKG: normal EKG, normal sinus rhythm.   Anesthesia Physical Anesthesia Plan  ASA: III  Anesthesia Plan: General and Regional   Post-op Pain Management: GA combined w/ Regional for post-op pain   Induction: Intravenous  Airway Management Planned: LMA  Additional Equipment:   Intra-op Plan:   Post-operative Plan: Extubation in OR  Informed Consent: I have reviewed the patients History and Physical, chart, labs and discussed  the procedure including the risks, benefits and alternatives for the proposed anesthesia with the patient or authorized representative who has indicated his/her understanding and acceptance.   Dental advisory given  Plan Discussed with: CRNA  Anesthesia Plan Comments: (Possible ETT if GERD not well controlled or weight distribution not conducive to LMA.  Pt did not tolerate Pect Block. )       Anesthesia Quick Evaluation

## 2014-12-25 NOTE — Discharge Instructions (Signed)
[facility] - DISCHARGE INSTRUCTIONS TO PATIENT  Activity:  Driving - May drive in 3 or 4 days, if doing well and off pain meds   Lifting - Take it easy for 1 week, no lifting more than 15 pounds.  Then no limit.  Wound Care:   Leave bandage for 2 days, then may remove the bandage and shower.  Diet:  As tolerated.  Follow up appointment:  Call Dr. Pollie Friar office [facility]) at [phone] for an appointment in 2 to 3 weeks.  Medications and dosages:  Resume your home medications.             She has vicodin at home already.  Call Dr. Lucia Gaskins or his office  [phone]) if you have:  Temperature greater than 100.4,  Persistent nausea and vomiting,  Severe uncontrolled pain,  Redness, tenderness, or signs of infection (pain, swelling, redness, odor or green/yellow discharge around the site),  Difficulty breathing, headache or visual disturbances,  Any other questions or concerns you may have after discharge.  In an emergency, call 911 or go to an Emergency Department at a nearby hospital.

## 2014-12-25 NOTE — Op Note (Signed)
12/25/2014  4:35 PM  PATIENT:  Kristy Harris DOB: [birth year] MRN: [id number]  PREOP DIAGNOSIS:  LEFT BREAST CANCER  POSTOP DIAGNOSIS:   Left breast cancer, 3 o'clock position (T1, N0)  PROCEDURE:   Procedure(s):  RADIOACTIVE SEED GIUDED LEFT BREAST LUMPECTOMY, LEFT AXILLARY SENTINEL LYMPH NODE BIOPSY,   SURGEON:   Alphonsa Overall, M.D.  ANESTHESIA:   general  Anesthesiologist: Lorrene Reid, MD; Effie Berkshire, MD CRNA: Laretta Alstrom, CRNA  General  EBL:  Minimal  ml  DRAINS: none   LOCAL MEDICATIONS USED:   30 cc 1/4% marcaine  SPECIMEN:   Left breast lumpectomy (suture medial), Inferior lumpectomy margin (suture medial), Superior lumpectomy margin (suture medial), Left axillary sentinel lymph node (counts 1700/background 5)  COUNTS CORRECT:  YES  INDICATIONS FOR PROCEDURE:  Kristy Harris is a 69 y.o. (DOB: 12-Jan-1946) AA  female whose primary care physician is Kristy Mustache, MD and comes for left breast lumpectomy and left axillary sentinel lymph node biopsy.   She had a US guided biopsy of her left breast on 11/01/2014 at [facility] of a 1.3 cm mass that showed invasive ductal ca.   She has seen Drs Lindi Adie and [facility] for oncology.   The options for breast cancer treatment have been discussed with the patient. She elected to proceed with lumpectomy and axillary sentinel lymph node.     The indications and potential complications of surgery were explained to the patient. Potential complications include, but are not limited to, bleeding, infection, the need for further surgery, and nerve injury.     She had a I131 seed placed on 12/20/2014 in her right breast at [facility].  I confirmed the presence of the I131 seed in the pre op area using the Neoprobe.  The seed is in the 3 o'clock position of the left breast.   In the holding area, her left areola was injected with 1 millicurie of Technitium Sulfur Colloid.  OPERATIVE NOTE:   The patient was taken to  room # 2 at [facility] where she underwent a general anesthesia  supervised by Anesthesiologist: Lorrene Reid, MD; Effie Berkshire, MD CRNA: Laretta Alstrom, CRNA. Her left breast and axilla were prepped with  ChloraPrep and sterilely draped.    A time-out and the surgical check list was reviewed.    I turned attention to the cancer which was about at the 3 o'clock position of the left breast.   I used the Neoprobe to identify the I131 seed.  I tried to excise an area around the tumor of at least 1 cm.    I excised this block of breast tissue approximately 3 cm by 4 cm  in diameter.  I placed a suture in the medial aspect of the biopsy specimen.   I painted the lumpectomy specimen with the 6 color paint kit and did a specimen mammogram which confirmed the mass, clip, and the seed were all in the right position in the specimen.  The specimen was sent to pathology who called back to confirm that they have the seed and the specimen.   I did excise the superior margin (suture medial) and the inferior margin (suture medial) and painted the specimens.  Note, the superior margin was between the lumpectomy site and the left axillary sentinel lymph node site.   I then started the left axillary sentinel lymph node biopsy. I went through the left breast lumpectomy site to get to the sentinel lymph node.  I found a hot area at the junction of the breast and the pectoralis major muscle. I cut down and  identified a hot node that had counts of 1,700 and the background has 5 counts.   I checked her internal mammary nodes and supraclavicular nodes with the neoprobe and found no other hot area. The axillary node was then sent to pathology.    I then irrigated the wound with saline. I infiltrated approximately 30 mL of 1% local between the incisions.  I placed 6 clips to mark biopsy cavity, at 12, 3, 6, and 9 o'clock. Two clips were placed on the pectoralis major.   I then closed all the wounds in layers using 3-0 Vicryl  sutures for the deep layer. At the skin, I closed the incisions with a 5-0 Monocryl suture. The incisions were then painted with LiquiBand.  She had gauze place over the wounds and placed in a breast binder.   The patient tolerated the procedure well, was transported to the recovery room in good condition. Sponge and needle count were correct at the end of the case.   Final pathology is pending.   Alphonsa Overall, MD, [facility] Surgery Pager: [phone] Office phone:  [phone]

## 2014-12-25 NOTE — Interval H&P Note (Signed)
History and Physical Interval Note:  12/25/2014 3:03 PM  Kristy Harris  has presented today for surgery, with the diagnosis of LEFT BREAST CANCER  The various methods of treatment have been discussed with the patient and family.  Seed in good position.  Family in room.  After consideration of risks, benefits and other options for treatment, the patient has consented to  Procedure(s): RADIOACTIVE SEED GIUDED LEFT BREAST LUMPECTOMY, LEFT AXILLARY SENTINEL LYMPH NODE BIOPSY (Left) as a surgical intervention .  The patient's history has been reviewed, patient examined, no change in status, stable for surgery.  I have reviewed the patient's chart and labs.  Questions were answered to the patient's satisfaction.     Kahne Helfand H

## 2014-12-25 NOTE — Anesthesia Procedure Notes (Signed)
Anesthesia Regional Block:  Pectoralis block  Pre-Anesthetic Checklist: ,, timeout performed, Correct Patient, Correct Site, Correct Laterality, Correct Procedure, Correct Position, site marked, Risks and benefits discussed,  Surgical consent,  Pre-op evaluation,  At surgeon's request and post-op pain management  Laterality: Left  Prep: chloraprep       Needles:  Injection technique: Single-shot  Needle Type: Stimiplex     Needle Length: 5cm 5 cm Needle Gauge: 22 and 22 G    Additional Needles: Pectoralis block  Nerve Stimulator or Paresthesia:  Response: biceps flexion,   Additional Responses:   Narrative:  Start time: 12/25/2014 1:55 PM End time: 12/25/2014 2:00 PM Injection made incrementally with aspirations every 5 mL.  Performed by: Personally  Anesthesiologist: Suella Broad D  Additional Notes: Functioning IV was confirmed and monitors were applied.  A 12mm 22ga Stimuplex needle was used. Sterile prep and drape,hand hygiene and sterile gloves were used.  Negative aspiration and negative test dose. Patient unable tolerate procedure due to positioning and probe pressure. Block aborted after 5cc 0.5% Bup.

## 2014-12-25 NOTE — Anesthesia Postprocedure Evaluation (Signed)
  Anesthesia Post-op Note  Patient: Kristy Harris  Procedure(s) Performed: Procedure(s) (LRB): RADIOACTIVE SEED GIUDED LEFT BREAST LUMPECTOMY, LEFT AXILLARY SENTINEL LYMPH NODE BIOPSY (Left)  Patient Location: PACU  Anesthesia Type: General  Level of Consciousness: awake and alert   Airway and Oxygen Therapy: Patient Spontanous Breathing  Post-op Pain: mild  Post-op Assessment: Post-op Vital signs reviewed, Patient's Cardiovascular Status Stable, Respiratory Function Stable, Patent Airway and No signs of Nausea or vomiting  Last Vitals:  Filed Vitals:   12/25/14 1711  BP:   Pulse: 78  Temp:   Resp: 22    Post-op Vital Signs: stable   Complications: No apparent anesthesia complications

## 2014-12-25 NOTE — Transfer of Care (Signed)
Immediate Anesthesia Transfer of Care Note  Patient: Kristy Harris  Procedure(s) Performed: Procedure(s): RADIOACTIVE SEED GIUDED LEFT BREAST LUMPECTOMY, LEFT AXILLARY SENTINEL LYMPH NODE BIOPSY (Left)  Patient Location: PACU  Anesthesia Type:General  Level of Consciousness: awake, alert , oriented and patient cooperative  Airway & Oxygen Therapy: Patient Spontanous Breathing and Patient connected to nasal cannula oxygen  Post-op Assessment: Report given to RN and Post -op Vital signs reviewed and stable  Post vital signs: Reviewed and stable  Last Vitals:  Filed Vitals:   12/25/14 1234  BP: 151/64  Pulse: 87  Temp: 36.4 C  Resp: 18    Complications: No apparent anesthesia complications

## 2014-12-26 ENCOUNTER — Encounter (HOSPITAL_COMMUNITY): Payer: Self-pay | Admitting: Surgery

## 2015-01-01 ENCOUNTER — Encounter: Payer: Self-pay | Admitting: Hematology and Oncology

## 2015-01-01 ENCOUNTER — Encounter: Payer: Self-pay | Admitting: *Deleted

## 2015-01-01 ENCOUNTER — Ambulatory Visit (HOSPITAL_BASED_OUTPATIENT_CLINIC_OR_DEPARTMENT_OTHER): Payer: Medicare Other | Admitting: Hematology and Oncology

## 2015-01-01 VITALS — BP 157/65 | HR 82 | Temp 98.5°F | Resp 19 | Ht 63.5 in | Wt 301.9 lb

## 2015-01-01 DIAGNOSIS — C50412 Malignant neoplasm of upper-outer quadrant of left female breast: Secondary | ICD-10-CM

## 2015-01-01 NOTE — Addendum Note (Signed)
Addended by: Prentiss Bells on: 01/01/2015 03:41 PM   Modules accepted: Medications

## 2015-01-01 NOTE — Assessment & Plan Note (Addendum)
Left lumpectomy 12/25/2014: IDC 1.7 cm, positive for LVI, with DCIS, 1/1 sentinel node positive deposit 1.9 cm with extracapsular extension, ER 90%, PR 90%, HER-2 negative, Ki-67 29% T1 cN1 stage II a  Pathology review: I discuss final pathology report and provided her with a copy of this report. She has significant involvement of one sentinel lymph node with extracapsular extension. This raises the risk of recurrence significantly.  Recommendation: 1. Mammaprint testing to determine if she would benefit from chemotherapy 2. Followed by adjuvant radiation 3. Followed by adjuvant antiestrogen therapy  Mammaprint counseling: MINDACT is a prospective, randomized phase III controlled trial that investigates the clinical utility of MammaPrint, when compared to standard clinical pathological criteria, with 6,693 patients enrolled from over 111 institutions. Clinical high-risk patients with a Low Risk MammaPrint result, including 48% node-positive, had 5-year distant metastasis-free survival rate in excess of 94 percent, whether randomized to receive adjuvant chemotherapy or not proving MammaPrint's ability to safely identify Low Risk patients.  Return to clinic based on Mammaprint test results. We will make an appointment if with radiation oncology if she is low risk or with thousand she is high risk and needs chemotherapy. She may only be able to tolerate Taxotere and Cytoxan.

## 2015-01-01 NOTE — Progress Notes (Signed)
Patient Care Team: Dione Housekeeper, MD as PCP - General (Family Medicine) Druscilla Brownie, MD as Consulting Physician (Dermatology) Minus Breeding, MD as Consulting Physician (Cardiology) Larey Dresser, MD as Consulting Physician (Cardiology) Nicholas Lose, MD as Consulting Physician (Hematology and Oncology) Kyung Rudd, MD as Consulting Physician (Radiation Oncology) Alphonsa Overall, MD as Consulting Physician (General Surgery)  DIAGNOSIS: No matching staging information was found for the patient.  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 She is here today to discuss the pathology report. She is healing very well from the surgery. She is accompanied by her daughter today.  REVIEW OF SYSTEMS:   Constitutional: Denies fevers, chills or abnormal weight loss Eyes: Denies blurriness of vision Ears, nose, mouth, throat, and face: Denies mucositis or sore throat Respiratory: Denies cough, dyspnea or wheezes Cardiovascular: Denies palpitation, chest discomfort or lower extremity swelling Gastrointestinal:  Denies nausea, heartburn or change in  bowel habits Skin: Denies abnormal skin rashes Lymphatics: Denies new lymphadenopathy or easy bruising Neurological:Denies numbness, tingling or new weaknesses Behavioral/Psych: Mood is stable, no new changes  Breast: Healing very well from recent surgery All other systems were reviewed with the patient and are negative.  I have reviewed the past medical history, past surgical history, social history and family history with the patient and they are unchanged from previous note.  ALLERGIES:  is allergic to penicillins.  MEDICATIONS:  Current Outpatient Prescriptions  Medication Sig Dispense Refill  . acetaminophen (TYLENOL) 325 MG tablet Take 325-650 mg by mouth every 6 (six) hours as needed for pain.    Marland Kitchen albuterol (PROVENTIL HFA;VENTOLIN HFA) 108 (90 BASE) MCG/ACT inhaler Inhale 2 puffs into the lungs every 4 (four) hours as needed for shortness of breath.    Marland Kitchen atorvastatin (LIPITOR) 20 MG tablet Take 20 mg by mouth every morning.     . bismuth subsalicylate (PEPTO BISMOL) 262 MG chewable tablet Chew 2 tablets (524 mg total) by mouth 2 (two) times daily. (Patient taking differently: Chew 524 mg by mouth as needed. ) 22 tablet 0  . Cholecalciferol (VITAMIN D-3) 5000 UNITS TABS Take 5,000 Units by mouth every morning.    . famotidine (PEPCID) 20 MG tablet Take 1 tablet (20 mg total) by mouth 2 (two) times daily. (Patient taking differently: Take 20 mg by mouth daily. ) 60 tablet 3  . ferrous sulfate 325 (65 FE) MG tablet Take 1 tablet (325 mg total) by mouth every morning. (Patient taking differently: Take 325 mg by mouth 3 (three) times daily with meals. ) 30 tablet 0  . fluconazole (DIFLUCAN) 100 MG tablet TAKE ONE (1) TABLET EACH DAY    . furosemide (LASIX) 20 MG tablet TAKE  ONE TABLET BY MOUTH TWICE DAILY    . glipiZIDE (GLUCOTROL XL) 10 MG 24 hr tablet Take 1 tablet by mouth daily.    Marland Kitchen HYDROcodone-acetaminophen (NORCO/VICODIN) 5-325 MG per tablet Take 1 tablet by mouth 2 (two) times  daily as needed for moderate pain.     Marland Kitchen losartan (COZAAR) 100 MG tablet Take 100 mg by mouth daily.    . metFORMIN (GLUCOPHAGE-XR) 500 MG 24 hr tablet Take 500 mg by mouth 2 (two) times daily.     . metoprolol (TOPROL-XL) 100 MG 24 hr tablet Take 100 mg by mouth every morning.     . ondansetron (ZOFRAN) 4 MG tablet Take 1 tablet (4 mg total) by mouth every 6 (six) hours as needed for nausea. 20 tablet 0  . pantoprazole (PROTONIX) 40 MG tablet Take 1 tablet (40 mg total) by mouth daily. 90 tablet 0  . polyethylene glycol (MIRALAX / GLYCOLAX) packet Take 17 g by mouth daily as needed for mild constipation. 14 each 0  . sulfamethoxazole-trimethoprim (BACTRIM,SEPTRA) 400-80 MG per tablet TAKE ONE TABLET BY MOUTH TWICE DAILY    . verapamil (CALAN) 120 MG tablet Take 120 mg by mouth every morning.      No current facility-administered medications for this visit.    PHYSICAL EXAMINATION: ECOG PERFORMANCE STATUS: 1 - Symptomatic but completely ambulatory  Filed Vitals:   01/01/15 1409  BP: 157/65  Pulse: 82  Temp: 98.5 F (36.9 C)  Resp: 19   Filed Weights   01/01/15 1409  Weight: 301 lb 14.4 oz (136.941 kg)    GENERAL:alert, no distress and comfortable, wheelchair-bound SKIN: skin color, texture, turgor are normal, no rashes or significant lesions EYES: normal, Conjunctiva are pink and non-injected, sclera clear OROPHARYNX:no exudate, no erythema and lips, buccal mucosa, and tongue normal  NECK: supple, thyroid normal size, non-tender, without nodularity LYMPH:  no palpable lymphadenopathy in the cervical, axillary or inguinal LUNGS: clear to auscultation and percussion with normal breathing effort HEART: regular rate & rhythm and no murmurs and no lower extremity edema ABDOMEN:abdomen soft, non-tender and normal bowel sounds Musculoskeletal:no cyanosis of digits and no clubbing  NEURO: alert & oriented x 3 with fluent speech, no focal motor/sensory deficits  LABORATORY DATA:  I  have reviewed the data as listed   Chemistry      Component Value Date/Time   NA 138 12/20/2014 0833   K 4.3 12/20/2014 0833   CL 100* 12/20/2014 0833   CO2 27 12/20/2014 0833   BUN 11 12/20/2014 0833   CREATININE 0.76 12/20/2014 0833      Component Value Date/Time   CALCIUM 9.4 12/20/2014 0833   ALKPHOS 104 05/28/2014 1111   AST 30 05/28/2014 1111   ALT 21 05/28/2014 1111   BILITOT 0.8 05/28/2014 1111       Lab Results  Component Value Date   WBC 5.9 12/20/2014   HGB 11.7* 12/20/2014   HCT 37.6 12/20/2014   MCV 82.6 12/20/2014   PLT 239 12/20/2014   NEUTROABS 3.3 05/28/2014   ASSESSMENT & PLAN:  Breast cancer of upper-outer quadrant of left female breast Left lumpectomy 12/25/2014: IDC 1.7 cm, positive for LVI, with DCIS, 1/1 sentinel node positive deposit 1.9 cm with extracapsular extension, ER 90%, PR 90%, HER-2 negative, Ki-67 29% T1 cN1 stage II a  Pathology review: I discuss final pathology report and provided her with a copy of this report. She has significant involvement of one sentinel lymph node with extracapsular extension. This raises  the risk of recurrence significantly.  Recommendation: 1. Mammaprint testing to determine if she would benefit from chemotherapy 2. Followed by adjuvant radiation 3. Followed by adjuvant antiestrogen therapy  Mammaprint counseling: MINDACT is a prospective, randomized phase III controlled trial that investigates the clinical utility of MammaPrint, when compared to standard clinical pathological criteria, with 6,693 patients enrolled from over 111 institutions. Clinical high-risk patients with a Low Risk MammaPrint result, including 48% node-positive, had 5-year distant metastasis-free survival rate in excess of 94 percent, whether randomized to receive adjuvant chemotherapy or not proving MammaPrint's ability to safely identify Low Risk patients.  Return to clinic based on Mammaprint test results. We will make an appointment if  with radiation oncology if she is low risk or with thousand she is high risk and needs chemotherapy. She may only be able to tolerate Taxotere and Cytoxan.  No orders of the defined types were placed in this encounter.   The patient has a good understanding of the overall plan. she agrees with it. she will call with any problems that may develop before the next visit here.   Rulon Eisenmenger, MD 01/01/2015

## 2015-01-01 NOTE — Progress Notes (Signed)
Ordered mammoprint per Dr. Lindi Adie order.  Faxed requisition to [facility] and informed pathology.

## 2015-01-02 ENCOUNTER — Encounter: Payer: Self-pay | Admitting: *Deleted

## 2015-01-04 ENCOUNTER — Telehealth: Payer: Self-pay | Admitting: *Deleted

## 2015-01-04 NOTE — Telephone Encounter (Signed)
Spoke to pt and discussed recommendations for genetic counseling and further discussion with Dr. Lindi Adie. Scheduled and confirmed genetics on 11/8 at 1100 followed by appt with Dr. Lindi Adie.

## 2015-01-09 ENCOUNTER — Other Ambulatory Visit: Payer: Medicare Other

## 2015-01-09 ENCOUNTER — Other Ambulatory Visit: Payer: Self-pay | Admitting: Surgery

## 2015-01-09 ENCOUNTER — Ambulatory Visit (HOSPITAL_BASED_OUTPATIENT_CLINIC_OR_DEPARTMENT_OTHER): Payer: Medicare Other | Admitting: Genetic Counselor

## 2015-01-09 ENCOUNTER — Encounter (HOSPITAL_COMMUNITY): Payer: Self-pay

## 2015-01-09 ENCOUNTER — Encounter: Payer: Self-pay | Admitting: Genetic Counselor

## 2015-01-09 ENCOUNTER — Ambulatory Visit (HOSPITAL_BASED_OUTPATIENT_CLINIC_OR_DEPARTMENT_OTHER): Payer: Medicare Other | Admitting: Hematology and Oncology

## 2015-01-09 ENCOUNTER — Other Ambulatory Visit: Payer: Self-pay | Admitting: *Deleted

## 2015-01-09 ENCOUNTER — Telehealth: Payer: Self-pay | Admitting: *Deleted

## 2015-01-09 ENCOUNTER — Encounter: Payer: Self-pay | Admitting: *Deleted

## 2015-01-09 ENCOUNTER — Encounter: Payer: Self-pay | Admitting: Hematology and Oncology

## 2015-01-09 VITALS — BP 160/61 | HR 72 | Temp 97.6°F | Resp 18 | Ht 63.5 in | Wt 301.6 lb

## 2015-01-09 DIAGNOSIS — C50412 Malignant neoplasm of upper-outer quadrant of left female breast: Secondary | ICD-10-CM

## 2015-01-09 DIAGNOSIS — Z803 Family history of malignant neoplasm of breast: Secondary | ICD-10-CM

## 2015-01-09 DIAGNOSIS — Z801 Family history of malignant neoplasm of trachea, bronchus and lung: Secondary | ICD-10-CM

## 2015-01-09 DIAGNOSIS — Z800 Family history of malignant neoplasm of digestive organs: Secondary | ICD-10-CM

## 2015-01-09 DIAGNOSIS — Z315 Encounter for genetic counseling: Secondary | ICD-10-CM

## 2015-01-09 DIAGNOSIS — C773 Secondary and unspecified malignant neoplasm of axilla and upper limb lymph nodes: Secondary | ICD-10-CM | POA: Diagnosis not present

## 2015-01-09 DIAGNOSIS — Z8042 Family history of malignant neoplasm of prostate: Secondary | ICD-10-CM

## 2015-01-09 DIAGNOSIS — Z170 Estrogen receptor positive status [ER+]: Secondary | ICD-10-CM

## 2015-01-09 DIAGNOSIS — Z808 Family history of malignant neoplasm of other organs or systems: Secondary | ICD-10-CM | POA: Diagnosis not present

## 2015-01-09 NOTE — Progress Notes (Signed)
REFERRING PROVIDER: Serena Croissant, MD  PRIMARY PROVIDER:  Josue Hector, MD  PRIMARY REASON FOR VISIT:  1. Breast cancer of upper-outer quadrant of left female breast (HCC)   2. Family history of breast cancer   3. Family history of prostate cancer   4. Family history of lung cancer   5. Family history of throat cancer      HISTORY OF PRESENT ILLNESS:   Kristy Harris, a 69 y.o. female, was seen for a [facility] cancer genetics consultation at the request of Dr. Pamelia Hoit due to a personal history of breast cancer and family history of breast, prostate and other cancers.  Ms. Lienhard presents to clinic today with her daughter to discuss the possibility of a hereditary predisposition to cancer, genetic testing, and to further clarify her future cancer risks, as well as potential cancer risks for family members.   In 2016, at the age of 58, Ms. 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 This was treated with lumpectomy and Mammaprint is currently pending.   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  First live birth at age 56.  OCP use for approximately 0 years.  Ovaries intact: yes.  Hysterectomy: yes in her 30s for fibroids Menopausal status:  postmenopausal.  HRT use: 0 years. Colonoscopy: yes; normal in 2015 Mammogram within the last year: was having them annually until 2014-2015 when she was trying to treat a boil underneath her arm. Number of breast biopsies: 1. Up to date with pelvic exams:  yes. Any excessive radiation exposure in the past:  no  Past Medical History  Diagnosis Date  . Diabetes mellitus   . Morbid obesity (HCC)   . Recurrent boils     of perineal and buttocks  . Hypertension   . Hyperlipidemia   . COPD (chronic obstructive pulmonary disease) (HCC)   . Enlarged heart   . Anemia   . Arthritis   . Heart murmur   . Urinary frequency   . Sleep apnea     no cpap used since weight loss  . H. pylori infection   . Diverticulosis   . Hidradenitis suppurativa   . Breast cancer (HCC) 11/01/14    left breast   . PONV (postoperative nausea and vomiting)   . Aortic stenosis     mild AS 11/24/14 echo    Past Surgical History  Procedure Laterality Date  . Pilonidal cyst excision    . Bladder suspension      x 2  . Abcess drainage    . Cardiac catheterization  02/21/2011    Normal coronary arteries, normal EF  . Tonsillectomy  yrs ago  . Abdominal hysterectomy  25 yrs ago  . Irrigation and debridement abscess Right 12/23/2012  Procedure: incision  AND DEBRIDEMENT right buttock infection ;  Surgeon: Shann Medal, MD;  Location: [facility];  Service: General;  Laterality: Right;  . Hydradenitis excision Left 12/08/2013    Procedure: EXCISION HIDRADENITIS AXILLA;  Surgeon: Alphonsa Overall, MD;  Location: [facility];  Service: General;  Laterality: Left;  . Excision hydradenitis labia N/A 12/08/2013    Procedure: EXCISION HIDRADENITIS PUBIC AREA;  Surgeon: Alphonsa Overall, MD;  Location: [facility];  Service: General;  Laterality: N/A;  . Breast lumpectomy with radioactive seed and sentinel lymph node biopsy Left 12/25/2014    Procedure: RADIOACTIVE SEED GIUDED LEFT BREAST LUMPECTOMY, LEFT AXILLARY SENTINEL LYMPH NODE  BIOPSY;  Surgeon: Alphonsa Overall, MD;  Location: [facility];  Service: General;  Laterality: Left;    Social History   Social History  . Marital Status: Widowed    Spouse Name: N/A  . Number of Children: 5  . Years of Education: N/A   Occupational History  . Textile work     Disabled   Social History Main Topics  . Smoking status: Former Smoker -- 1.00 packs/day for 10 years    Types: Cigarettes    Quit date: 03/03/1994  . Smokeless tobacco: Never Used  . Alcohol Use: No  . Drug Use: No  . Sexual Activity: Not on file   Other Topics Concern  . Not on file   Social History Narrative   Lives with grandson.  Widowed.  Has 2 sons and 3 daughters     FAMILY HISTORY:  We obtained a detailed, 4-generation family history.  Significant diagnoses are listed below: Family History  Problem Relation Age of Onset  . Heart attack Mother     had multiple health problems  . Lung cancer Father 4    smoker  . Prostate cancer Brother   . Lung cancer Brother     dx. 30s; smoker  . Throat cancer Brother     dx. 34s; smoker  . Diabetes Sister     has 3 living sisters with multiple health problems  . Hypertension Brother   . Hypertension Son   . Hypertension Daughter   . Breast cancer Sister 77  . Breast cancer Sister     dx. 76s  . Breast cancer Maternal Grandmother     dx. 75s  . Breast cancer Maternal Aunt     dx. older than 76  . Dementia Maternal Aunt   . Breast cancer Cousin     dx. 69s    Ms. Caulfield has three daughters, ages 5-51.  She had two sons, one who is currently 18 and one who passed away at 73 after he fell at work.  None of Ms. Iseman's children have ever had cancer.  Ms. Guillen has five full sisters and three full brothers--all of whom have passed away except three sisters.  The sisters currently living are between 24 and 77--one was diagnosed with breast cancer at 8 and has no children.  Another sister was diagnosed with breast cancer in her 27s and passed away  at 28.  The fifth sister was murdered at 24.  Ms. Enyeart believes that one sister has had genetic testing in the past, but tested negative.  Two of Ms. Lick's brothers have had cancer.  One was diagnosed with prostate cancer and passed away in his early 58s.  The other was diagnosed with throat cancer in his 34s, then with lung cancer in his 7s (he was a smoker).    There  is not cancer in any nieces or nephews.  Ms. Vitale mother died of a heart attack at 7.  Her mother had three full sisters and four full brothers--all of whom have passed away.  One sister was diagnosed with breast cancer over the age of 4.  She had three daughters and four sons--none of whom have had cancer.  The other two sisters passed way between 25s-70s; the four brothers never had cancer and passed away in their 58s or 77s.  Ms. Heick maternal grandmother was diagnosed with breast cancer in her 71s.  Her maternal grandfather died at a later age in life.  She has no further information for any maternal great aunts/uncles or grandparents.  Ms. Coluccio's father died of lung cancer at 96; he was a smoker.  He had five full sisters who all passed away in their 60s-70s, but whom never had cancer.  One of Ms. Beever's paternal first cousins was diagnosed with breast cancer in her 37s.  Ms. Alesi paternal grandparents both passed away at older ages.  She has no further information for any paternal great uncles/aunts or great grandparents.  Patient's maternal ancestors are of African-American descent, and paternal ancestors are of Native [place] and African-American descent. There is no reported Ashkenazi Jewish ancestry. There is no known consanguinity.  GENETIC COUNSELING ASSESSMENT: JONEE LAMORE is a 69 y.o. female with a personal and family history of cancer which is somewhat suggestive of a hereditary breast cancer syndrome and predisposition to cancer. We, therefore, discussed and recommended the following at  today's visit.   DISCUSSION: We reviewed the characteristics, features and inheritance patterns of hereditary cancer syndromes, particularly those caused by mutations within the BRCA1/2 genes. We also discussed genetic testing, including the appropriate family members to test, the process of testing, insurance coverage and turn-around-time for results. We discussed the implications of a negative, positive and/or variant of uncertain significant result. We recommended Ms. Rosita Fire pursue genetic testing for the 20-gene Breast/Ovarian Cancer Panel through [facility] Hope Pigeon, MD).  The Breast/Ovarian Cancer Panel offered by [facility] includes sequencing and deletion/duplication analysis for the following 19 genes:  ATM, BARD1, BRCA1, BRCA2, BRIP1, CDH1, CHEK2, FANCC, MLH1, MSH2, MSH6, NBN, PALB2, PMS2, PTEN, RAD51C, RAD51D, TP53, and XRCC2.  This panel also includes deletion/duplication analysis (without sequencing) for one gene, EPCAM.  Based on Ms. Cutright's personal and family history of cancer, she meets medical criteria for genetic testing. Despite that she meets criteria, she may still have an out of pocket cost. We discussed that if her out of pocket cost for testing is over $100, the laboratory will call and confirm whether she wants to proceed with testing.  If the out of pocket cost of testing is less than $100 she will be billed by the genetic testing laboratory.   PLAN: After considering the risks, benefits, and limitations, Ms. Burgin  provided informed consent to pursue genetic testing and the blood sample was sent to [facility] for analysis of the 20-gene Breast Ovarian Cancer Panel. Results should be available within approximately 2-3 weeks' time, at which point they will be disclosed by telephone to Ms. Boston, as will any additional recommendations warranted by these results. Ms. Klostermann will receive a summary of her genetic counseling visit and a copy of her results once  available. This information will also be available in Epic. We encouraged Ms. Stang to remain in contact with cancer genetics annually so that we can continuously update the family history and inform her  of any changes in cancer genetics and testing that may be of benefit for her family. Ms. Sterbenz questions were answered to her satisfaction today. Our contact information was provided should additional questions or concerns arise.  Thank you for the referral and allowing [place] to share in the care of your patient.   Jeanine Luz, MS Genetic Counselor [email or web address] Phone: [phone]  The patient was seen for a total of 60 minutes in face-to-face genetic counseling.  This patient was discussed with Drs. Magrinat, Lindi Adie and/or Burr Medico who agrees with the above.    _______________________________________________________________________ For Office Staff:  Number of people involved in session: 2 Was an Intern/ student involved with case: no

## 2015-01-09 NOTE — Assessment & Plan Note (Signed)
Left lumpectomy: IDC 1.7 cm, positive for LVI, with DCIS, 1/1 sentinel node positive deposit 1.9 cm with extracapsular extension, ER 90%, PR 90%, HER-2 positive ratio 2.4, Ki-67 29% T1 cN1 stage II a  Pathology review: I discussed the pathology report in great detail including the fact that on the original biopsy the HER-2 was negative but on the final pathology HER-2 was actually positive. Certainly raises the risk of cancer recurrence and hence she would not be a candidate to do Mammaprint testing.  Recommendation: 1. Adjuvant chemotherapy with Abraxane Herceptin weekly 12 followed by Herceptin maintenance every 3 weeks for 1 year 2. Followed by adjuvant radiation 3. Followed by adjuvant antiestrogen therapy  Plan: 1. Genetic counseling appointment today 2. Echocardiogram 3. Port placement 4. Chemotherapy class  Return to clinic in 2 weeks to start chemotherapy

## 2015-01-09 NOTE — Telephone Encounter (Signed)
Called and left message for Amy/scheduling at [facility] for patient to see Dr. Whitney Muse and receive her chemo treatments there.

## 2015-01-09 NOTE — Progress Notes (Signed)
Patient Care Team: Dione Housekeeper, MD as PCP - General (Family Medicine) Druscilla Brownie, MD as Consulting Physician (Dermatology) Minus Breeding, MD as Consulting Physician (Cardiology) Larey Dresser, MD as Consulting Physician (Cardiology) Nicholas Lose, MD as Consulting Physician (Hematology and Oncology) Kyung Rudd, MD as Consulting Physician (Radiation Oncology) Alphonsa Overall, MD as Consulting Physician (General Surgery)  DIAGNOSIS: No matching staging information was found for the patient.  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+ by IHC, ratio 1.15, KI 67: 29%, T1 cN0 stage IA clinical stage   12/25/2014 Surgery Left lumpectomy: IDC 1.7 cm, positive for LVI, with DCIS, 1/1 sentinel node positive deposit 1.9 cm with extracapsular extension, ER 90%, PR 90%, HER-2 positive ratio 2.4, Ki-67 29% T1 cN1 stage II a    CHIEF COMPLIANT: follow-up to discuss final pathology report showing that she has HER-2 positive disease  INTERVAL HISTORY: Kristy Harris is a 69 year old with above-mentioned history of left breast cancer underwent lumpectomy and final pathology revealed one positive lymph node with extracapsular extension that was ER/PR positive and HER-2 positive. Originally the biopsy was HER-2 negative. When I saw her last week I did not have the HER-2 result from the final lumpectomy specimen and I discussed sending Mammaprint testing. Once the HER-2 result came back we urgently brought her in for an appointment discussed the significance of that. She is here today to discuss the results. She is in a  wheelchair but can drive by herself. Her daughter is accompanying her but she does not have driver's license.  REVIEW OF SYSTEMS:   Constitutional: Denies fevers, chills or abnormal weight loss Eyes: Denies blurriness of vision Ears, nose, mouth, throat, and face: Denies mucositis or sore throat Respiratory: Denies cough, dyspnea or wheezes Cardiovascular: Denies palpitation, chest discomfort or lower extremity swelling Gastrointestinal:  Denies nausea, heartburn or change in bowel habits Skin: Denies abnormal skin rashes Lymphatics: Denies new lymphadenopathy or easy bruising Neurological:diabetic neuropathy mild Behavioral/Psych: Mood is stable, no new changes  All other systems were reviewed with the patient and are negative.  I have reviewed the past medical history, past surgical history, social history and family history with the patient and they are unchanged from previous note.  ALLERGIES:  is allergic to penicillins.  MEDICATIONS:  Current Outpatient Prescriptions  Medication Sig Dispense Refill  . acetaminophen (TYLENOL) 325 MG tablet Take 325-650 mg by mouth every 6 (six) hours as needed for pain.    Marland Kitchen albuterol (PROVENTIL HFA;VENTOLIN HFA) 108 (90 BASE) MCG/ACT inhaler Inhale 2 puffs into the lungs every 4 (four) hours as needed for shortness of breath.    Marland Kitchen atorvastatin (LIPITOR) 20 MG tablet Take 20 mg by mouth every morning.     . bismuth subsalicylate (PEPTO BISMOL) 262 MG chewable tablet Chew 2 tablets (524 mg total) by mouth 2 (two) times daily. (Patient taking differently: Chew 524 mg by mouth as needed. ) 22 tablet 0  . Cholecalciferol (VITAMIN D-3) 5000 UNITS TABS Take 5,000 Units by mouth every morning.    . famotidine (PEPCID) 20 MG tablet Take 1 tablet (20 mg total) by mouth 2 (two) times daily. (Patient taking differently: Take  20 mg by mouth daily. ) 60 tablet 3  . ferrous sulfate 325 (65 FE) MG tablet Take 1 tablet (325 mg total) by mouth every morning. (Patient  taking differently: Take 325 mg by mouth 3 (three) times daily with meals. ) 30 tablet 0  . fluconazole (DIFLUCAN) 100 MG tablet TAKE ONE (1) TABLET EACH DAY    . furosemide (LASIX) 20 MG tablet TAKE ONE TABLET BY MOUTH TWICE DAILY    . glipiZIDE (GLUCOTROL XL) 10 MG 24 hr tablet Take 1 tablet by mouth daily.    Marland Kitchen HYDROcodone-acetaminophen (NORCO/VICODIN) 5-325 MG per tablet Take 1 tablet by mouth 2 (two) times daily as needed for moderate pain.     Marland Kitchen ketoconazole (NIZORAL) 2 % cream APPLY TOPICALLY DAILY. USE EXTERNALLY FOR VAGINAL ITCHING DAILY AS NEEDED  0  . losartan (COZAAR) 100 MG tablet Take 100 mg by mouth daily.    . metFORMIN (GLUCOPHAGE-XR) 500 MG 24 hr tablet Take 500 mg by mouth 2 (two) times daily.     . metoprolol (TOPROL-XL) 100 MG 24 hr tablet Take 100 mg by mouth every morning.     . ondansetron (ZOFRAN) 4 MG tablet Take 1 tablet (4 mg total) by mouth every 6 (six) hours as needed for nausea. 20 tablet 0  . pantoprazole (PROTONIX) 40 MG tablet Take 1 tablet (40 mg total) by mouth daily. 90 tablet 0  . polyethylene glycol (MIRALAX / GLYCOLAX) packet Take 17 g by mouth daily as needed for mild constipation. 14 each 0  . sulfamethoxazole-trimethoprim (BACTRIM,SEPTRA) 400-80 MG per tablet TAKE ONE TABLET BY MOUTH TWICE DAILY    . verapamil (CALAN) 120 MG tablet Take 120 mg by mouth every morning.      No current facility-administered medications for this visit.    PHYSICAL EXAMINATION: ECOG PERFORMANCE STATUS: 2 - Symptomatic, <50% confined to bed  Filed Vitals:   01/09/15 1232  BP: 160/61  Pulse: 72  Temp: 97.6 F (36.4 C)  Resp: 18   Filed Weights   01/09/15 1232  Weight: 301 lb 9.6 oz (136.805 kg)    GENERAL:alert, no distress and comfortable SKIN: skin color, texture, turgor are normal, no rashes or significant lesions EYES: normal, Conjunctiva are pink and non-injected, sclera clear OROPHARYNX:no exudate, no erythema and lips, buccal mucosa, and tongue normal    NECK: supple, thyroid normal size, non-tender, without nodularity LYMPH:  no palpable lymphadenopathy in the cervical, axillary or inguinal LUNGS: clear to auscultation and percussion with normal breathing effort HEART: regular rate & rhythm and no murmurs and no lower extremity edema ABDOMEN:abdomen soft, non-tender and normal bowel sounds Musculoskeletal:no cyanosis of digits and no clubbing  NEURO: alert & oriented x 3 with fluent speech,grade 1 neuropathy   LABORATORY DATA:  I have reviewed the data as listed   Chemistry      Component Value Date/Time   NA 138 12/20/2014 0833   K 4.3 12/20/2014 0833   CL 100* 12/20/2014 0833   CO2 27 12/20/2014 0833   BUN 11 12/20/2014 0833   CREATININE 0.76 12/20/2014 0833      Component Value Date/Time   CALCIUM 9.4 12/20/2014 0833   ALKPHOS 104 05/28/2014 1111   AST 30 05/28/2014 1111   ALT 21 05/28/2014 1111   BILITOT 0.8 05/28/2014 1111       Lab Results  Component Value Date   WBC 5.9 12/20/2014   HGB 11.7* 12/20/2014   HCT 37.6 12/20/2014   MCV 82.6 12/20/2014  PLT 239 12/20/2014   NEUTROABS 3.3 05/28/2014   ASSESSMENT & PLAN:  Breast cancer of upper-outer quadrant of left female breast Left lumpectomy: IDC 1.7 cm, positive for LVI, with DCIS, 1/1 sentinel node positive deposit 1.9 cm with extracapsular extension, ER 90%, PR 90%, HER-2 positive ratio 2.4 (HER-2 on the biopsy specimen was negative), Ki-67 29% T1 cN1 stage II a  Pathology review: I discussed the pathology report in great detail including the fact that on the original biopsy the HER-2 was negative but on the final pathology HER-2 was actually positive.   Mammaprint was obtained prior to my knowledge that the HER-2 was positive and it surprisingly came back as low risk and luminal type. Based on clinical high risk features including node positive disease, lymphovascular invasion, HER-2 positive disease, I will ignore the Mammaprint result and recommended  systemic chemotherapy.  Recommendation: 1. Adjuvant chemotherapy with Abraxane Herceptin weekly 12 followed by Herceptin maintenance every 3 weeks for 1 year (I recommended Abraxane and Herceptin mainly because of her borderline performance status and being diabetic we cannot use steroids) 2. Followed by adjuvant radiation 3. Followed by adjuvant antiestrogen therapy  Chemotherapy counseling: I discussed the risks and benefits of chemotherapy including the risk of hair loss, nausea, decrease in blood counts, neuropathy, taste changes, fatigue as potential side effects of Abraxane. We also discussed the risk of Herceptin in decreasing cardiac ejection fraction.  Plan: 1. Genetic counseling appointment today 2. Echocardiogram was done by her cardiologist in September 3. Port placement will be requested through Dr. Lucia Gaskins 4. Chemotherapy class  Chemotherapy could be done closer to her home. I recommended that she see Dr. Ancil Linsey at [place] to receive oncology care closer to home. I will inform Dr. Whitney Muse to get her an appointment to see her soon. Since she had her surgery on 12/25/2014, she could be treated after Thanksgiving.   No orders of the defined types were placed in this encounter.   The patient has a good understanding of the overall plan. she agrees with it. she will call with any problems that may develop before the next visit here.   Rulon Eisenmenger, MD 01/09/2015

## 2015-01-09 NOTE — Progress Notes (Signed)
Received mammaprint results from [place], sent to scan.

## 2015-01-11 ENCOUNTER — Other Ambulatory Visit (HOSPITAL_COMMUNITY): Payer: Self-pay | Admitting: *Deleted

## 2015-01-11 ENCOUNTER — Encounter (HOSPITAL_COMMUNITY): Payer: Self-pay | Admitting: *Deleted

## 2015-01-11 NOTE — H&P (Signed)
[facility]  Location: [facility] Surgery Patient #: [id number] DOB: 1945-05-11 Widowed / Language: [place] / Race: Black or African American Female  History of Present Illness Shanon Brow H. Lucia Gaskins MD; 11/16/2014 3:00 PM)  The patient is a 69 year old female who presents with breast cancer.   Her PCP is Dr. Alger Simons.  Oncology - Lindi Adie. She sees Dr. Lisbeth Renshaw on Monday, 11/20/2014.  She is accompanied by her daughter, Freda Munro.   Ms. Slutsky comes with a new problem. She had an US guided left breast biopsy on 11/01/2014 through [facility]. The mass is in the UOQ of the left breast and measures 1.3 cm. The path report ([id number]) - well differentiated IDC. The tumor is ER - 90%, PR - 905, Ki67 - 29%, and HER2Neu - positive. Shehas a GM and 2 aunts on her mother's side who had breast cnacer. her sister just had breast cancer surgery by Dr. Ninfa Linden about 3 months ago. Her last mammogram was about 3 years ago. The hidradinitis interferred with her mammograms. She is not on hormone therapy.   I discussed the options for breast cancer treatment with the patient. I discussed a multidisciplinary approach to the treatment of breast cancer, which includes medical oncology and radiation oncology. I discussed the surgical options of lumpectomy vs. mastectomy. If mastectomy, there is the possibility of reconstruction. I discussed the options of lymph node biopsy. The treatment plan depends on the pathologic staging of the tumor and the patient's personal wishes.  The risks of surgery include, but are not limited to, bleeding, infection, the need for further surgery, and nerve injury.  Power port placement I discussed the indications and potential complications of the power port placement. The primary complications of the power port, include, but are not limited to, bleeding, infection, nerve injury, thrombosis, and pneumothorax.  Other medical problems: 1.  Abscess, both buttocks. Left axilla and left groin. Hidradinitis. Excision of right buttocks infection - 12/23/2012 - D. Glenisha Gundry Excision of left axilla and left groins hidradinitis on 12/08/2013 by Dr. Keturah Barre. Lucia Gaskins She is still having some issues with her right groin  2. Morbid obesity - BMI 46.8 She has lost 20 pounds since 06/18/2011 We have talked about this is a big cause of her recurrent infections. She had a peak weight of 331, so she is down in weight, but has stalled around 270. 3. Diabetes mellitus Not on insulin. She said that her last BS was 96. 4. Using a cane to walk. Limited mobility. 5. Aortic stenosis, mild Cardiac nuclear scan - 02/02/2014 - EF 58% [place] - 02/21/2011 Card clearance from Dr. Marigene Ehlers on 11/13/2012 - but she has not really seen cardiology since then. As best she can tell, she has no chronic follow up with cardiology. 6. Chronic lower extremity edema. 7. GI bleed secondary to diverticulosis - March 2016 She has stopped her aspirin and has not restarted it. She has been seen by Dr. Doretha Sou. 8. She takes vicodin jsut about daily  Social History: Unmarried. Grandson lives with her at night. Daughter, Freda Munro, is with patient. She also has a and granddaughter, Tessa Lerner.  Allergies Elbert Ewings, [place]; 11/16/2014 1:32 PM) Penicillins  Medication History Elbert Ewings, [place]; 11/16/2014 1:36 PM) Verapamil HCl ER (120MG Tablet ER, Oral) Active. Protonix (40MG Tablet DR, Oral) Active. Zofran (4MG Tablet, Oral every six hours) Active. Metoprolol Succinate ER (100MG Tablet ER 24HR, Oral) Active. MetFORMIN HCl ER (MOD) (500MG Tablet ER 24HR, Oral) Active. Losartan Potassium (100MG Tablet, Oral)  Active. GlipiZIDE ER (10MG Tablet ER 24HR, Oral) Active. Famotidine (20MG Tablet, Oral) Active. Ferrous Fumarate (325MG Capsule, Oral) Active. Cholecalciferol (5000UNIT Tablet, Oral) Active. Pepto-Bismol  (262MG Tablet, Oral) Active. Atorvastatin Calcium (20MG Tablet, Oral) Active. Albuterol Sulfate (108 (90 Base)MCG/ACT Aero Pow Br Act, Inhalation) Active. Tylenol Allergy Sinus (500-2-30MG Capsule, Oral) Active. Medications Reconciled    Review of Systems Shanon Brow H. Lucia Gaskins MD; 11/16/2014 2:08 PM) General Not Present- Appetite Loss, Chills, Fatigue, Fever, Night Sweats, Weight Gain and Weight Loss. Skin Not Present- Change in Wart/Mole, Dryness, Hives, Jaundice, New Lesions, Non-Healing Wounds, Rash and Ulcer. HEENT Present- Wears glasses/contact lenses. Not Present- Earache, Hearing Loss, Hoarseness, Nose Bleed, Oral Ulcers, Ringing in the Ears, Seasonal Allergies, Sinus Pain, Sore Throat, Visual Disturbances and Yellow Eyes. Respiratory Present- Snoring. Not Present- Bloody sputum, Chronic Cough, Difficulty Breathing and Wheezing. Breast Not Present- Breast Mass, Breast Pain, Nipple Discharge and Skin Changes. Cardiovascular Present- Difficulty Breathing Lying Down, Rapid Heart Rate and Shortness of Breath. Not Present- Chest Pain, Leg Cramps, Palpitations and Swelling of Extremities. Gastrointestinal Not Present- Abdominal Pain, Bloating, Bloody Stool, Change in Bowel Habits, Chronic diarrhea, Constipation, Difficulty Swallowing, Excessive gas, Gets full quickly at meals, Hemorrhoids, Indigestion, Nausea, Rectal Pain and Vomiting. Female Genitourinary Not Present- Frequency, Nocturia, Painful Urination, Pelvic Pain and Urgency. Musculoskeletal Present- Back Pain, Joint Pain and Joint Stiffness. Not Present- Muscle Pain, Muscle Weakness and Swelling of Extremities. Neurological Present- Trouble walking. Not Present- Decreased Memory, Fainting, Headaches, Numbness, Seizures, Tingling, Tremor and Weakness. Psychiatric Not Present- Anxiety, Bipolar, Change in Sleep Pattern, Depression, Fearful and Frequent crying. Endocrine Not Present- Cold Intolerance, Excessive Hunger, Hair Changes, Heat  Intolerance, Hot flashes and New Diabetes. Hematology Not Present- Easy Bruising, Excessive bleeding, Gland problems, HIV and Persistent Infections.  Vitals Elbert Ewings CMA; 11/16/2014 1:37 PM) 11/16/2014 1:36 PM Weight: 300.6 lb Height: 63in Body Surface Area: 2.46 m Body Mass Index: 53.25 kg/m  Temp.: 97.82F(Temporal)  Pulse: 84 (Regular)  BP: 142/82 (Sitting, Left Arm, Standard)   Physical Exam  General: Obese AA F alert. She needs some help getting on the exam table and has some trouble laying flat. HEENT: Normal. Pupils equal.  Neck: Supple. No mass. No thyroid mass. Lymph Nodes: No supraclavicular or cervical nodes.  Lungs: Clear to auscultation and symmetric breath sounds. Heart: RRR. III/VI systolic murmur   Breast: Right - no mass  Left - Incision in the left breast looks good.  Abdomen: Soft. No mass. No tenderness. No hernia. Normal bowel sounds. Obese. She has hidradinits in the right axila.  Neurologic: Grossly intact to motor and sensory function.    Assessment & Plan: 1.  BREAST CANCER, STAGE 1, LEFT (C50.912)  Story: Biopsy 11/01/2014 at [facility] - path report [phone]) - well differentiated IDC. The tumor is ER - 90%, PR - 905, Ki67 - 29%, and HER2Neu - positive Impression: Plan:   1)  Reviewed with Dr Burt Knack in the office today. Plan is for repeat echo. If LVF is normal and AS not severe she is cleared from our standpoint for surgery. We will be available peri op for any cardiac issues. From [facility] - 11/23/2014 Echo - 11/24/2014 - EF - 55-60%   2) left breast lumpectomy (seed loc), left axillary SLNbx Left lumpectomy and SLNBx - 12/25/2014 - 1.7 IDC with 1/1 nodes positive, margin are clear I discussed findings with the patient. She sees Dr. Lindi Adie next week.   3)  Briseis's breast cancer is Her2Neu positive. She met with Dr.  Gudena today for discussion of chemotx.     From [facility]: "Hello Dr. Lucia Gaskins,  We need a port placed for Ms  Wiker as she will need chemo.  She is currently seeing Dr. Lindi Adie, but will transfer to Dr. Whitney Muse and have chemo at [facility]."  I discussed the port placement and the chemotx with Tyann.  2. Aortic stenosis, mild Cardiac nuclear scan - 02/02/2014 - EF 58% [place] - 02/21/2011 Reviewed with Dr Burt Knack in the office today. Plan is for repeat echo. If LVF is normal and AS not severe she is cleared from our standpoint for surgery. We will be available peri op for any cardiac issues. From [facility] - 11/23/2014 Echo - 11/24/2014 - EF - 55-60%  3. HIDRADENITIS - axilla and buttocks Excision of right buttocks infection - 12/23/2012 - D. Herminio Kniskern Excision of left axilla and left groins hidradinitis on 12/08/2013 by Dr. Keturah Barre. Loretta Doutt Photos in [employer] while these areas were healing - 02/09/2014 She is still having some issues with her right groin  4. Morbid obesity - BMI 46.8 She has lost 20 pounds since 06/18/2011 We have talked about this is a big cause of her recurrent infections. She had a peak weight of 331, so she is down in weight, but has stalled around 270. 5. Diabetes mellitus Not on insulin. She said that her last BS was 96. 6. Using a cane to walk. Limited mobility.  7. Chronic lower extremity edema. 8. GI bleed secondary to diverticulosis - March 2016 She has stopped her aspirin and has not restarted it. She has been seen by Dr. Doretha Sou. 9. She takes vicodin jsut about daily  Alphonsa Overall, MD, [facility] Surgery Pager: [phone] Office phone:  [phone]

## 2015-01-11 NOTE — Progress Notes (Signed)
Had pre op labs 12-25-14 prior to lumpectomy surgery, labs to be repeated day of surgery 01-1115.

## 2015-01-12 ENCOUNTER — Ambulatory Visit (HOSPITAL_COMMUNITY): Payer: Medicare Other

## 2015-01-12 ENCOUNTER — Ambulatory Visit (HOSPITAL_COMMUNITY): Payer: Medicare Other | Admitting: Certified Registered Nurse Anesthetist

## 2015-01-12 ENCOUNTER — Ambulatory Visit (HOSPITAL_COMMUNITY)
Admission: RE | Admit: 2015-01-12 | Discharge: 2015-01-12 | Disposition: A | Discharge status: Home / Self Care | Payer: Medicare Other | Source: Ambulatory Visit | Attending: Surgery | Admitting: Surgery

## 2015-01-12 ENCOUNTER — Encounter (HOSPITAL_COMMUNITY): Payer: Self-pay | Admitting: Anesthesiology

## 2015-01-12 ENCOUNTER — Encounter (HOSPITAL_COMMUNITY)
Admission: RE | Disposition: A | Discharge status: Home / Self Care | Payer: Self-pay | Source: Ambulatory Visit | Attending: Surgery

## 2015-01-12 DIAGNOSIS — M1990 Unspecified osteoarthritis, unspecified site: Secondary | ICD-10-CM | POA: Diagnosis not present

## 2015-01-12 DIAGNOSIS — Z79899 Other long term (current) drug therapy: Secondary | ICD-10-CM | POA: Insufficient documentation

## 2015-01-12 DIAGNOSIS — J449 Chronic obstructive pulmonary disease, unspecified: Secondary | ICD-10-CM | POA: Diagnosis not present

## 2015-01-12 DIAGNOSIS — I350 Nonrheumatic aortic (valve) stenosis: Secondary | ICD-10-CM | POA: Diagnosis not present

## 2015-01-12 DIAGNOSIS — C50912 Malignant neoplasm of unspecified site of left female breast: Secondary | ICD-10-CM | POA: Insufficient documentation

## 2015-01-12 DIAGNOSIS — Z87891 Personal history of nicotine dependence: Secondary | ICD-10-CM | POA: Diagnosis not present

## 2015-01-12 DIAGNOSIS — K219 Gastro-esophageal reflux disease without esophagitis: Secondary | ICD-10-CM | POA: Insufficient documentation

## 2015-01-12 DIAGNOSIS — Z7984 Long term (current) use of oral hypoglycemic drugs: Secondary | ICD-10-CM | POA: Diagnosis not present

## 2015-01-12 DIAGNOSIS — Z6841 Body Mass Index (BMI) 40.0 and over, adult: Secondary | ICD-10-CM | POA: Diagnosis not present

## 2015-01-12 DIAGNOSIS — R600 Localized edema: Secondary | ICD-10-CM | POA: Insufficient documentation

## 2015-01-12 DIAGNOSIS — G4730 Sleep apnea, unspecified: Secondary | ICD-10-CM | POA: Diagnosis not present

## 2015-01-12 DIAGNOSIS — Z171 Estrogen receptor negative status [ER-]: Secondary | ICD-10-CM | POA: Insufficient documentation

## 2015-01-12 DIAGNOSIS — L732 Hidradenitis suppurativa: Secondary | ICD-10-CM | POA: Insufficient documentation

## 2015-01-12 DIAGNOSIS — E1151 Type 2 diabetes mellitus with diabetic peripheral angiopathy without gangrene: Secondary | ICD-10-CM | POA: Insufficient documentation

## 2015-01-12 DIAGNOSIS — Z452 Encounter for adjustment and management of vascular access device: Secondary | ICD-10-CM | POA: Diagnosis not present

## 2015-01-12 DIAGNOSIS — Z95828 Presence of other vascular implants and grafts: Secondary | ICD-10-CM

## 2015-01-12 DIAGNOSIS — I10 Essential (primary) hypertension: Secondary | ICD-10-CM | POA: Diagnosis not present

## 2015-01-12 HISTORY — PX: PORTACATH PLACEMENT: SHX2246

## 2015-01-12 HISTORY — DX: Gastro-esophageal reflux disease without esophagitis: K21.9

## 2015-01-12 HISTORY — DX: Reserved for inherently not codable concepts without codable children: IMO0001

## 2015-01-12 LAB — CBC
HEMATOCRIT: 35.7 % — AB (ref 36.0–46.0)
Hemoglobin: 11 g/dL — ABNORMAL LOW (ref 12.0–15.0)
MCH: 25.7 pg — AB (ref 26.0–34.0)
MCHC: 30.8 g/dL (ref 30.0–36.0)
MCV: 83.4 fL (ref 78.0–100.0)
Platelets: 229 10*3/uL (ref 150–400)
RBC: 4.28 MIL/uL (ref 3.87–5.11)
RDW: 15.6 % — AB (ref 11.5–15.5)
WBC: 5.8 10*3/uL (ref 4.0–10.5)

## 2015-01-12 LAB — BASIC METABOLIC PANEL
Anion gap: 8 (ref 5–15)
BUN: 11 mg/dL (ref 6–20)
CO2: 30 mmol/L (ref 22–32)
Calcium: 9.1 mg/dL (ref 8.9–10.3)
Chloride: 100 mmol/L — ABNORMAL LOW (ref 101–111)
Creatinine, Ser: 0.77 mg/dL (ref 0.44–1.00)
GFR calc Af Amer: >60 mL/min (ref >60)
GLUCOSE: 152 mg/dL — AB (ref 65–99)
POTASSIUM: 3.7 mmol/L (ref 3.5–5.1)
Sodium: 138 mmol/L (ref 135–145)

## 2015-01-12 LAB — GLUCOSE, CAPILLARY
Glucose-Capillary: 156 mg/dL — ABNORMAL HIGH (ref 65–99)
Glucose-Capillary: 139 mg/dL — ABNORMAL HIGH (ref 65–99)

## 2015-01-12 SURGERY — INSERTION, TUNNELED CENTRAL VENOUS DEVICE, WITH PORT
Anesthesia: General | Site: Neck

## 2015-01-12 MED ORDER — CIPROFLOXACIN IN D5W 400 MG/200ML IV SOLN
INTRAVENOUS | Status: AC
  Filled 2015-01-12: qty 200

## 2015-01-12 MED ORDER — ONDANSETRON HCL 4 MG/2ML IJ SOLN
INTRAMUSCULAR | Status: AC
  Filled 2015-01-12: qty 2

## 2015-01-12 MED ORDER — SUCCINYLCHOLINE CHLORIDE 20 MG/ML IJ SOLN
INTRAMUSCULAR | Status: DC | PRN
  Administered 2015-01-12: 160 mg via INTRAVENOUS

## 2015-01-12 MED ORDER — LIDOCAINE HCL (CARDIAC) 20 MG/ML IV SOLN
INTRAVENOUS | Status: AC
  Filled 2015-01-12: qty 5

## 2015-01-12 MED ORDER — SODIUM CHLORIDE 0.9 % IR SOLN
Status: DC | PRN
  Administered 2015-01-12: 1000 mL

## 2015-01-12 MED ORDER — PROPOFOL 10 MG/ML IV BOLUS
INTRAVENOUS | Status: AC
  Filled 2015-01-12: qty 20

## 2015-01-12 MED ORDER — CIPROFLOXACIN IN D5W 400 MG/200ML IV SOLN
400.0000 mg | INTRAVENOUS | Status: AC
  Administered 2015-01-12: 400 mg via INTRAVENOUS

## 2015-01-12 MED ORDER — PROMETHAZINE HCL 25 MG/ML IJ SOLN: 6.2500 mg | INTRAMUSCULAR | Status: DC | PRN

## 2015-01-12 MED ORDER — PHENYLEPHRINE HCL 10 MG/ML IJ SOLN
INTRAMUSCULAR | Status: DC | PRN
  Administered 2015-01-12 (×2): 80 ug via INTRAVENOUS

## 2015-01-12 MED ORDER — ONDANSETRON HCL 4 MG/2ML IJ SOLN
INTRAMUSCULAR | Status: DC | PRN
  Administered 2015-01-12: 4 mg via INTRAVENOUS

## 2015-01-12 MED ORDER — DEXAMETHASONE SODIUM PHOSPHATE 10 MG/ML IJ SOLN
INTRAMUSCULAR | Status: DC | PRN
  Administered 2015-01-12: 10 mg via INTRAVENOUS

## 2015-01-12 MED ORDER — MIDAZOLAM HCL 5 MG/5ML IJ SOLN
INTRAMUSCULAR | Status: DC | PRN
  Administered 2015-01-12: 1 mg via INTRAVENOUS

## 2015-01-12 MED ORDER — LACTATED RINGERS IV SOLN
INTRAVENOUS | Status: DC
  Administered 2015-01-12: 11:00:00 via INTRAVENOUS
  Administered 2015-01-12: 1000 mL via INTRAVENOUS

## 2015-01-12 MED ORDER — CHLORHEXIDINE GLUCONATE 4 % EX LIQD
1.0000 | Freq: Once | CUTANEOUS | Status: DC
Start: 2015-01-13 — End: 2015-01-12

## 2015-01-12 MED ORDER — FENTANYL CITRATE (PF) 100 MCG/2ML IJ SOLN
INTRAMUSCULAR | Status: AC
  Filled 2015-01-12: qty 2

## 2015-01-12 MED ORDER — SODIUM CHLORIDE 0.9 % IV SOLN
Freq: Once | INTRAVENOUS | Status: AC
  Administered 2015-01-12: 10:00:00
  Filled 2015-01-12: qty 1.2

## 2015-01-12 MED ORDER — LIDOCAINE HCL (PF) 1 % IJ SOLN
INTRAMUSCULAR | Status: DC | PRN
  Administered 2015-01-12: 17 mL

## 2015-01-12 MED ORDER — LIDOCAINE HCL 1 % IJ SOLN
INTRAMUSCULAR | Status: AC
  Filled 2015-01-12: qty 20

## 2015-01-12 MED ORDER — CHLORHEXIDINE GLUCONATE 4 % EX LIQD: 1.0000 "application " | Freq: Once | CUTANEOUS | Status: DC

## 2015-01-12 MED ORDER — HEPARIN SOD (PORK) LOCK FLUSH 100 UNIT/ML IV SOLN
INTRAVENOUS | Status: AC
  Filled 2015-01-12: qty 5

## 2015-01-12 MED ORDER — LIDOCAINE HCL (CARDIAC) 20 MG/ML IV SOLN
INTRAVENOUS | Status: DC | PRN
  Administered 2015-01-12: 100 mg via INTRAVENOUS

## 2015-01-12 MED ORDER — PROPOFOL 10 MG/ML IV BOLUS
INTRAVENOUS | Status: DC | PRN
  Administered 2015-01-12: 200 mg via INTRAVENOUS
  Administered 2015-01-12: 50 mg via INTRAVENOUS

## 2015-01-12 MED ORDER — FENTANYL CITRATE (PF) 100 MCG/2ML IJ SOLN
INTRAMUSCULAR | Status: AC
  Filled 2015-01-12: qty 4

## 2015-01-12 MED ORDER — FENTANYL CITRATE (PF) 100 MCG/2ML IJ SOLN
INTRAMUSCULAR | Status: DC | PRN
  Administered 2015-01-12 (×2): 50 ug via INTRAVENOUS

## 2015-01-12 MED ORDER — PHENYLEPHRINE 40 MCG/ML (10ML) SYRINGE FOR IV PUSH (FOR BLOOD PRESSURE SUPPORT)
PREFILLED_SYRINGE | INTRAVENOUS | Status: AC
  Filled 2015-01-12: qty 10

## 2015-01-12 MED ORDER — FENTANYL CITRATE (PF) 100 MCG/2ML IJ SOLN
25.0000 ug | INTRAMUSCULAR | Status: DC | PRN
  Administered 2015-01-12 (×2): 50 ug via INTRAVENOUS

## 2015-01-12 MED ORDER — MIDAZOLAM HCL 2 MG/2ML IJ SOLN
INTRAMUSCULAR | Status: AC
  Filled 2015-01-12: qty 4

## 2015-01-12 MED ORDER — HEPARIN SOD (PORK) LOCK FLUSH 100 UNIT/ML IV SOLN
INTRAVENOUS | Status: DC | PRN
Start: 2015-01-12 — End: 2015-01-12
  Administered 2015-01-12: 500 [IU] via INTRAVENOUS

## 2015-01-12 SURGICAL SUPPLY — 31 items
BAG DECANTER FOR FLEXI CONT (MISCELLANEOUS) ×3 IMPLANT
BENZOIN TINCTURE PRP APPL 2/3 (GAUZE/BANDAGES/DRESSINGS) ×3 IMPLANT
BLADE HEX COATED 2.75 (ELECTRODE) ×3 IMPLANT
BLADE SURG 15 STRL LF DISP TIS (BLADE) ×1 IMPLANT
BLADE SURG 15 STRL SS (BLADE) ×2
CHLORAPREP W/TINT 26ML (MISCELLANEOUS) ×3 IMPLANT
COVER SURGICAL LIGHT HANDLE (MISCELLANEOUS) ×3 IMPLANT
DECANTER SPIKE VIAL GLASS SM (MISCELLANEOUS) ×3 IMPLANT
DERMABOND ADVANCED (GAUZE/BANDAGES/DRESSINGS) ×2
DERMABOND ADVANCED .7 DNX12 (GAUZE/BANDAGES/DRESSINGS) ×1 IMPLANT
DRAPE C-ARM 42X120 X-RAY (DRAPES) ×3 IMPLANT
DRAPE LAPAROSCOPIC ABDOMINAL (DRAPES) ×3 IMPLANT
DRSG TEGADERM 4X4.75 (GAUZE/BANDAGES/DRESSINGS) ×3 IMPLANT
ELECT PENCIL ROCKER SW 15FT (MISCELLANEOUS) ×3 IMPLANT
ELECT REM PT RETURN 9FT ADLT (ELECTROSURGICAL) ×3
ELECTRODE REM PT RTRN 9FT ADLT (ELECTROSURGICAL) ×1 IMPLANT
GAUZE SPONGE 2X2 8PLY STRL LF (GAUZE/BANDAGES/DRESSINGS) ×1 IMPLANT
GAUZE SPONGE 4X4 12PLY STRL (GAUZE/BANDAGES/DRESSINGS) ×3 IMPLANT
GAUZE SPONGE 4X4 16PLY XRAY LF (GAUZE/BANDAGES/DRESSINGS) ×3 IMPLANT
GLOVE SURG SIGNA 7.5 PF LTX (GLOVE) ×3 IMPLANT
GOWN STRL REUS W/TWL XL LVL3 (GOWN DISPOSABLE) ×6 IMPLANT
KIT BASIN OR (CUSTOM PROCEDURE TRAY) ×3 IMPLANT
KIT PORT POWER 8FR ISP CVUE (Catheter) ×3 IMPLANT
NEEDLE HYPO 25X1 1.5 SAFETY (NEEDLE) ×3 IMPLANT
PACK BASIC VI WITH GOWN DISP (CUSTOM PROCEDURE TRAY) ×3 IMPLANT
SPONGE GAUZE 2X2 STER 10/PKG (GAUZE/BANDAGES/DRESSINGS) ×2
SUT MNCRL AB 4-0 PS2 18 (SUTURE) ×3 IMPLANT
SUT VIC AB 3-0 SH 18 (SUTURE) ×3 IMPLANT
SYR 20CC LL (SYRINGE) ×3 IMPLANT
SYRINGE 10CC LL (SYRINGE) ×3 IMPLANT
TOWEL OR 17X26 10 PK STRL BLUE (TOWEL DISPOSABLE) ×3 IMPLANT

## 2015-01-12 NOTE — Interval H&P Note (Signed)
History and Physical Interval Note:  01/12/2015 8:43 AM  Kristy Harris  has presented today for surgery, with the diagnosis of left breast cancer  The various methods of treatment have been discussed with the patient and family.   Son and daughter are here with the patient.  After consideration of risks, benefits and other options for treatment, the patient has consented to  Procedure(s): INSERTION PORT-A-CATH (N/A) as a surgical intervention .  The patient's history has been reviewed, patient examined, no change in status, stable for surgery.  I have reviewed the patient's chart and labs.  Questions were answered to the patient's satisfaction.     [name]

## 2015-01-12 NOTE — Anesthesia Procedure Notes (Signed)
Procedure Name: Intubation Date/Time: 01/12/2015 9:53 AM Performed by: Montel Clock Pre-anesthesia Checklist: Patient identified, Emergency Drugs available, Suction available, Patient being monitored and Timeout performed Patient Re-evaluated:Patient Re-evaluated prior to inductionOxygen Delivery Method: Circle system utilized Preoxygenation: Pre-oxygenation with 100% oxygen Intubation Type: IV induction Ventilation: Mask ventilation without difficulty and Oral airway inserted - appropriate to patient size Laryngoscope Size: Mac and 3 Grade View: Grade I Tube type: Oral Tube size: 7.0 mm Number of attempts: 1 Airway Equipment and Method: Stylet Placement Confirmation: ETT inserted through vocal cords under direct vision,  positive ETCO2 and breath sounds checked- equal and bilateral Secured at: 21 cm Tube secured with: Tape Dental Injury: Teeth and Oropharynx as per pre-operative assessment

## 2015-01-12 NOTE — Progress Notes (Signed)
Patient and family given instructions for patient to use incentive spirometer when she gets home and to cough and deep breath.  She will take her pain med after she gets home and has lunch

## 2015-01-12 NOTE — Transfer of Care (Signed)
Immediate Anesthesia Transfer of Care Note  Patient: Kristy Harris  Procedure(s) Performed: Procedure(s): INSERTION PORT-A-CATH (N/A)  Patient Location: PACU  Anesthesia Type:General  Level of Consciousness:  sedated, patient cooperative and responds to stimulation  Airway & Oxygen Therapy:Patient Spontanous Breathing and Patient connected to face mask oxgen  Post-op Assessment:  Report given to PACU RN and Post -op Vital signs reviewed and stable  Post vital signs:  Reviewed and stable  Last Vitals:  Filed Vitals:   01/12/15 0745  BP: 177/70  Pulse: 80  Temp: 36.7 C  Resp: 20    Complications: No apparent anesthesia complications

## 2015-01-12 NOTE — Op Note (Signed)
01/12/2015  10:34 AM  PATIENT:  Kristy Harris, 69 y.o., female MRN: [id number] DOB: 30-Dec-1945  PREOP DIAGNOSIS:  left breast cancer, anticipate chemotherapy  POSTOP DIAGNOSIS:   left breast cancer, anticipate chemotherapy  PROCEDURE:   Procedure(s):  Right subclavian, INSERTION PORT-A-CATH  SURGEON:   Alphonsa Overall, M.D.  ANESTHESIA:   general  Anesthesiologist: Franne Grip, MD CRNA: Montel Clock, CRNA  General  EBL:  minimal  ml  COUNTS CORRECT:  YES  INDICATIONS FOR PROCEDURE:  Kristy Harris is a 69 y.o. (DOB: 1945/06/14) AA female whose primary care physician is [name], MD and comes for power port placement for the treatment of left breast cancer.  Dr. Whitney Muse is her treating oncologist.  Dr. Lindi Adie has seen her in [place], but will transfer care to Dr. Whitney Muse.  Dr. Lisbeth Renshaw is her rad onc.   Kristy Harris had a left lumpectomy and left axillary sentinel lymph node biopsy on 12/25/2014.  Her path showed 1.7 IDC with 1/1 nodes positive, margin are clear.  The tumor is ER - 90%, PR - 905, Ki67 - 29%, and HER2Neu - positive   The indications and risks of the surgery were explained to the patient.  The risks include, but are not limited to, infection, bleeding, pneumothorax, nerve injury, and thrombosis of the vein.  OPERATIVE NOTE:  The patient was taken to Room #4 at [facility]  Anesthesia was provided by Anesthesiologist: Franne Grip, MD CRNA: Montel Clock, CRNA.  At the beginning of the operation, the patient was given 2 gm Ancef, had a roll placed under her back, and had the upper chest/neck prepped with Chloroprep and draped.   A time out was held and the surgery checklist reviewed.   The patient was placed in Trendelenburg position.  The left subclavian vein was accessed with a 16 gauge needle and a guide wire threaded through the needle into the vein.  The position of the wire was checked with fluoroscopy.  Because of her size, it was  difficult to see the wire and tubing in the mediastinum.   I then developed a pocket in the upper inner aspect of the right chest for the port reservoir.  I used the [employer] for venous access.  The reservoir was sewn in place with a 3-0 Vicryl suture.  The reservoir had been flushed with dilute (10 units/cc) heparin.   I then passed the silastic tubing from the reservoir incision to the subclavian stick site and used the 8 French introducer to pass it into the vein.  The tip of the silastic catheter was position at the junction of the SVC and the right atrium under fluoroscopy.  This was difficult to see because of her size.  The silastic catheter was then attached to the port with the bayonet device.     The entire port and tubing were checked with fluoroscopy and then the port was flushed with 4 cc of concentrated heparin (100 units/cc).   The wounds were then closed with 3-0 vicryl subcutaneous sutures and the skin closed with a 5-0 Monocryl suture.  The skin was painted with tincture of benzoin and painted with LiquidBand.   The patient was transferred to the recovery room in good condition.  The sponge and needle count were correct at the end of the case.  A CXR is ordered for port placement and pending at the time of this note.  Alphonsa Overall, MD, [facility] Pager: [phone]  Office phone:  [phone]

## 2015-01-12 NOTE — Anesthesia Postprocedure Evaluation (Signed)
  Anesthesia Post-op Note  Patient: Kristy Harris  Procedure(s) Performed: Procedure(s) (LRB): INSERTION PORT-A-CATH (N/A)  Patient Location: PACU  Anesthesia Type: General  Level of Consciousness: awake and alert   Airway and Oxygen Therapy: Patient Spontanous Breathing  Post-op Pain: mild  Post-op Assessment: Post-op Vital signs reviewed, Patient's Cardiovascular Status Stable, Respiratory Function Stable, Patent Airway and No signs of Nausea or vomiting  Last Vitals:  Filed Vitals:   01/12/15 1245  BP: 169/79  Pulse: 84  Temp:   Resp: 16    Post-op Vital Signs: stable   Complications: No apparent anesthesia complications

## 2015-01-12 NOTE — Anesthesia Preprocedure Evaluation (Addendum)
Anesthesia Evaluation  Patient identified by MRN, date of birth, ID band Patient awake    Reviewed: Allergy & Precautions, NPO status , Patient's Chart, lab work & pertinent test results  History of Anesthesia Complications (+) PONV and history of anesthetic complications  Airway Mallampati: II  TM Distance: >3 FB Neck ROM: Full    Dental no notable dental hx.    Pulmonary shortness of breath, sleep apnea , pneumonia, resolved, COPD,  COPD inhaler, former smoker,    Pulmonary exam normal breath sounds clear to auscultation       Cardiovascular hypertension, Pt. on medications and Pt. on home beta blockers + Peripheral Vascular Disease  Normal cardiovascular exam+ Valvular Problems/Murmurs  Rhythm:Regular Rate:Normal  ECHO 11-24-14: Study Conclusions  - Left ventricle: The cavity size was normal. Wall thickness was normal. Systolic function was normal. The estimated ejection fraction was in the range of 55% to 60%. Wall motion was normal; there were no regional wall motion abnormalities. Doppler parameters are consistent with abnormal left ventricular relaxation (grade 1 diastolic dysfunction). Doppler parameters are consistent with high ventricular filling pressure. - Aortic valve: There was mild stenosis. - Mitral valve: Calcified annulus. - Left atrium: The atrium was mildly dilated. - Pericardium, extracardiac: A trivial pericardial effusion was identified.    Neuro/Psych negative neurological ROS  negative psych ROS   GI/Hepatic Neg liver ROS, GERD  ,  Endo/Other  diabetes, Type 2, Oral Hypoglycemic AgentsMorbid obesity  Renal/GU negative Renal ROS  negative genitourinary   Musculoskeletal  (+) Arthritis ,   Abdominal (+) + obese,   Peds negative pediatric ROS (+)  Hematology  (+) anemia ,   Anesthesia Other Findings   Reproductive/Obstetrics negative OB ROS                             Anesthesia Physical Anesthesia Plan  ASA: III  Anesthesia Plan: General   Post-op Pain Management:    Induction: Intravenous  Airway Management Planned: Oral ETT  Additional Equipment:   Intra-op Plan:   Post-operative Plan: Extubation in OR  Informed Consent: I have reviewed the patients History and Physical, chart, labs and discussed the procedure including the risks, benefits and alternatives for the proposed anesthesia with the patient or authorized representative who has indicated his/her understanding and acceptance.   Dental advisory given  Plan Discussed with: CRNA  Anesthesia Plan Comments:         Anesthesia Quick Evaluation

## 2015-01-12 NOTE — Discharge Instructions (Signed)
[facility] - DISCHARGE INSTRUCTIONS TO PATIENT  Activity:  Driving - May drive tomorrow   Lifting - No lifting for one week.  Wound Care:   Leave the incisions dry for 2 days, then may shower  Diet:  Regular  Follow up appointment:  Call Dr. Pollie Friar office [facility]) at [phone] for an appointment in 2 to 4 weeks.  Medications and dosages:  Resume your home medications.  You have a prescription for:  Vicodin.  Call Dr. Lucia Gaskins or his office  [phone]) if you have:  Temperature greater than 100.4,  Persistent nausea and vomiting,  Severe uncontrolled pain,  Redness, tenderness, or signs of infection (pain, swelling, redness, odor or green/yellow discharge around the site),  Difficulty breathing, headache or visual disturbances,  Any other questions or concerns you may have after discharge.  In an emergency, call 911 or go to an Emergency Department at a nearby hospital.

## 2015-01-23 ENCOUNTER — Encounter (HOSPITAL_COMMUNITY): Payer: Medicare Other | Attending: Hematology & Oncology | Admitting: Hematology & Oncology

## 2015-01-23 ENCOUNTER — Ambulatory Visit: Payer: Self-pay | Admitting: Genetic Counselor

## 2015-01-23 ENCOUNTER — Telehealth: Payer: Self-pay | Admitting: Genetic Counselor

## 2015-01-23 ENCOUNTER — Encounter (HOSPITAL_COMMUNITY): Payer: Self-pay | Admitting: Hematology & Oncology

## 2015-01-23 VITALS — BP 150/46 | HR 83 | Temp 97.6°F | Resp 16 | Wt 300.7 lb

## 2015-01-23 DIAGNOSIS — E669 Obesity, unspecified: Secondary | ICD-10-CM | POA: Diagnosis not present

## 2015-01-23 DIAGNOSIS — C50412 Malignant neoplasm of upper-outer quadrant of left female breast: Secondary | ICD-10-CM

## 2015-01-23 DIAGNOSIS — N61 Mastitis without abscess: Secondary | ICD-10-CM | POA: Diagnosis not present

## 2015-01-23 DIAGNOSIS — E119 Type 2 diabetes mellitus without complications: Secondary | ICD-10-CM | POA: Diagnosis not present

## 2015-01-23 DIAGNOSIS — Z170 Estrogen receptor positive status [ER+]: Secondary | ICD-10-CM

## 2015-01-23 DIAGNOSIS — Z1379 Encounter for other screening for genetic and chromosomal anomalies: Secondary | ICD-10-CM

## 2015-01-23 MED ORDER — CEPHALEXIN 500 MG PO CAPS: 500.0000 mg | ORAL_CAPSULE | Freq: Four times a day (QID) | ORAL | Status: DC

## 2015-01-23 NOTE — Progress Notes (Signed)
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 female is here because of newly diagnosed breast cancer. She was initially seen and evaluated in [place] at the multi-disciplinary breast clinic. She has stage II disease. Because of co-morbidities abraxane/herceptin was recommended in the adjuvant setting for her HER 2 positive disease.   Kristy Harris is accompanied by her daughter and daughter-in-law. She presents in a wheelchair today.  Dr. Lucia Gaskins performed her surgery. Genetics testing came back negative.  She had an echocardiogram with Dr. Exie Parody September 23rd. Her PCP is Dr. 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  She does not have any anti nausea medication. She has not been through chemotherapy teaching. She previously met with the [facility] patient navigator, [place]. Dr. Lindi Adie explained her breast cancer at length but she does not remember the details. Dr. Lindi Adie mentioned about a pill.   Notes that she recently noticed a bump under her left breast that busted and drained on its own. The bump came to a head on Sunday night. She considered calling Dr. Lucia Gaskins about it until it began draining..  It was not there when she  saw Dr. Lucia Gaskins last Tuesday. She denies fever or chills.  She uses a cane and walker while at home. Her mobility is limited because she needs knee replacements and she has neuropathy.  Her appetite is good. She received a flu shot in October. She sleeps well, though she does experience urinary frequency at night.    She is allergic to penicillin. When she took sulfa medications, they made her "groin itchy". She has taken antibiotics for years for hidradenitis. She has taken keflex before.     Breast cancer of upper-outer quadrant of left female breast ([place])   11/01/2014 Mammogram Possible mass in the left breast upper outer quadrant measuring 13 mm suspicious for breast cancer confirmed through ultrasound a spiculated hypoechoic mass ill-defined, no enlarged lymph nodes   11/01/2014 Initial Diagnosis Invasive ductal carcinoma, moderately differentiated, ER > 90%, PR> 90%, HER-2 -2+ by IHC, ratio 1.15, KI 67: 29%, T1 cN0 stage IA clinical stage   12/25/2014 Surgery Left lumpectomy: IDC 1.7 cm, positive for LVI, with DCIS, 1/1 sentinel node positive deposit 1.9 cm with extracapsular extension, ER 90%, PR 90%, HER-2 positive ratio 2.4, Ki-67 29% T1 cN1 stage II a   02/01/2015 -  Chemotherapy Abraxane/Herceptin     MEDICAL HISTORY:  Past Medical History  Diagnosis Date  . Diabetes mellitus   . Morbid obesity ([place])   . Recurrent boils     of perineal and buttocks  . Hypertension   . Hyperlipidemia   . COPD (chronic obstructive pulmonary disease) ([place])   . Enlarged heart   . Anemia   . Arthritis   . Heart murmur   . Urinary frequency   . Sleep apnea  no cpap used since weight loss  . H. pylori infection   . Diverticulosis april 2016  . Hidradenitis suppurativa   . PONV (postoperative nausea and vomiting)   . Aortic stenosis     mild AS 11/24/14 echo  . Shortness of breath dyspnea     with exertion  . Pneumonia years ago  . GERD (gastroesophageal reflux disease)   . Breast cancer  ([place]) 11/01/14    left breast     SURGICAL HISTORY: Past Surgical History  Procedure Laterality Date  . Pilonidal cyst excision    . Bladder suspension      x 2  . Abcess drainage      on bottom  . Cardiac catheterization  02/21/2011    Normal coronary arteries, normal EF  . Tonsillectomy  yrs ago  . Irrigation and debridement abscess Right 12/23/2012    Procedure: incision  AND DEBRIDEMENT right buttock infection ;  Surgeon: Shann Medal, MD;  Location: [facility];  Service: General;  Laterality: Right;  . Hydradenitis excision Left 12/08/2013    Procedure: EXCISION HIDRADENITIS AXILLA;  Surgeon: Alphonsa Overall, MD;  Location: [facility];  Service: General;  Laterality: Left;  . Excision hydradenitis labia N/A 12/08/2013    Procedure: EXCISION HIDRADENITIS PUBIC AREA;  Surgeon: Alphonsa Overall, MD;  Location: [facility];  Service: General;  Laterality: N/A;  . Breast lumpectomy with radioactive seed and sentinel lymph node biopsy Left 12/25/2014    Procedure: RADIOACTIVE SEED GIUDED LEFT BREAST LUMPECTOMY, LEFT AXILLARY SENTINEL LYMPH NODE BIOPSY;  Surgeon: Alphonsa Overall, MD;  Location: [place];  Service: General;  Laterality: Left;  . Abdominal hysterectomy  25 yrs ago    partial  . Portacath placement N/A 01/12/2015    Procedure: INSERTION PORT-A-CATH;  Surgeon: Alphonsa Overall, MD;  Location: [facility];  Service: General;  Laterality: N/A;    SOCIAL HISTORY: Social History   Social History  . Marital Status: Widowed    Spouse Name: N/A  . Number of Children: 5  . Years of Education: N/A   Occupational History  . Textile work     Disabled   Social History Main Topics  . Smoking status: Former Smoker -- 1.00 packs/day for 10 years    Types: Cigarettes    Quit date: 03/03/1994  . Smokeless tobacco: Never Used  . Alcohol Use: No  . Drug Use: No  . Sexual Activity: Not on file   Other Topics Concern  . Not on file   Social History Narrative   Lives with grandson.  Widowed.  Has 2 sons and  3 daughters  Widowed 79 years 37 children, oldest is 39 yo 8 grandchildren 1 great grandchild Worked in a factory She used to enjoy fishing and play softball. Ex smoker, quit 20 years ago. ETOH, none  FAMILY HISTORY: Family History  Problem Relation Age of Onset  . Heart attack Mother     had multiple health problems  . Lung cancer Father 37    smoker  . Prostate cancer Brother   . Lung cancer Brother     dx. 80s; smoker  . Throat cancer Brother     dx. 56s; smoker  . Diabetes Sister     has 3 living sisters with multiple health problems  . Hypertension Brother   . Hypertension Son   . Hypertension Daughter   . Breast cancer Sister 84  . Breast cancer Sister     dx. 21s  . Breast cancer Maternal Grandmother  dx. 84s  . Breast cancer Maternal Aunt     dx. older than 27  . Dementia Maternal Aunt   . Breast cancer Cousin     dx. 9s   indicated that her mother is deceased. She indicated that her father is deceased. She indicated that two of her four sisters are alive. She indicated that all of her three brothers are deceased. She indicated that her maternal grandmother is deceased. She indicated that her maternal grandfather is deceased. She indicated that her paternal grandmother is deceased. She indicated that her paternal grandfather is deceased. She indicated that her daughter is alive. She indicated that only one of her two sons is alive. She indicated that both of her maternal aunts are deceased. She indicated that her maternal uncle is deceased. She indicated that her paternal aunt is deceased. She indicated that her cousin is alive.  Mother died at 52 of a heart attack, she had a large heart and a hole in her heart. Non-smoker Father died at 31 of lung cancer, he was a smoker. 9 siblings, 4 living. One sister with breast cancer bilaterally, she is still living Another sister with breast cancer  ALLERGIES:  is allergic to lisinopril and penicillins.  MEDICATIONS:    Current Outpatient Prescriptions  Medication Sig Dispense Refill  . acetaminophen (TYLENOL) 500 MG tablet Take 1,000 mg by mouth every 6 (six) hours as needed for mild pain or headache.    . albuterol (PROVENTIL HFA;VENTOLIN HFA) 108 (90 BASE) MCG/ACT inhaler Inhale 2 puffs into the lungs every 4 (four) hours as needed for shortness of breath.    Marland Kitchen atorvastatin (LIPITOR) 20 MG tablet Take 20 mg by mouth every morning.     . bismuth subsalicylate (PEPTO BISMOL) 262 MG chewable tablet Chew 2 tablets (524 mg total) by mouth 2 (two) times daily. (Patient taking differently: Chew 524 mg by mouth 2 (two) times daily as needed for indigestion (acid reflux). ) 22 tablet 0  . Cholecalciferol (VITAMIN D-3) 5000 UNITS TABS Take 5,000 Units by mouth every morning.    . ferrous sulfate 325 (65 FE) MG tablet Take 1 tablet (325 mg total) by mouth every morning. (Patient taking differently: Take 325 mg by mouth 3 (three) times daily with meals. ) 30 tablet 0  . furosemide (LASIX) 20 MG tablet TAKE ONE TABLET BY MOUTH ONCE DAILY.    Marland Kitchen glipiZIDE (GLUCOTROL XL) 10 MG 24 hr tablet Take 10 mg by mouth daily with breakfast.     . HYDROcodone-acetaminophen (NORCO/VICODIN) 5-325 MG per tablet Take 1 tablet by mouth 2 (two) times daily as needed for moderate pain.     Marland Kitchen ketoconazole (NIZORAL) 2 % cream APPLY TOPICALLY DAILY. USE EXTERNALLY FOR VAGINAL ITCHING DAILY AS NEEDED  0  . losartan (COZAAR) 100 MG tablet Take 100 mg by mouth daily.    . metFORMIN (GLUCOPHAGE-XR) 500 MG 24 hr tablet Take 500 mg by mouth 2 (two) times daily.     . metoprolol (TOPROL-XL) 100 MG 24 hr tablet Take 100 mg by mouth every morning.     . pantoprazole (PROTONIX) 40 MG tablet Take 1 tablet (40 mg total) by mouth daily. 90 tablet 0  . polyethylene glycol (MIRALAX / GLYCOLAX) packet Take 17 g by mouth daily as needed for mild constipation. 14 each 0  . verapamil (CALAN-SR) 120 MG CR tablet Take 120 mg by mouth daily.    . cephALEXin (KEFLEX)  500 MG capsule Take 1 capsule (500 mg  total) by mouth 4 (four) times daily. 40 capsule 0  . fluconazole (DIFLUCAN) 100 MG tablet TAKE ONE (1) TABLET EACH DAY as needed for yeast infection caused by Bactrim.    Marland Kitchen lidocaine-prilocaine (EMLA) cream Apply a quarter size amount to port site 1 hour prior to chemo. Do not rub in. Cover with plastic wrap. 30 g 3  . ondansetron (ZOFRAN) 8 MG tablet Take 1 tablet (8 mg total) by mouth every 8 (eight) hours as needed for nausea or vomiting. 30 tablet 2  . PACLitaxel Protein-Bound Part (ABRAXANE IV) Inject into the vein. To be given weekly    . prochlorperazine (COMPAZINE) 10 MG tablet Take 1 tablet (10 mg total) by mouth every 6 (six) hours as needed for nausea or vomiting. 30 tablet 2  . sulfamethoxazole-trimethoprim (BACTRIM,SEPTRA) 400-80 MG per tablet TAKE ONE TABLET BY MOUTH ONCE DAILY.    . Trastuzumab (HERCEPTIN IV) Inject into the vein. To be given weekly     No current facility-administered medications for this visit.   Facility-Administered Medications Ordered in Other Visits  Medication Dose Route Frequency Provider Last Rate Last Dose  . heparin lock flush 100 unit/mL  500 Units Intracatheter Once PRN Patrici Ranks, MD      . sodium chloride 0.9 % injection 10 mL  10 mL Intracatheter PRN Patrici Ranks, MD   10 mL at 02/01/15 1018    Review of Systems  Constitutional: Negative.  Negative for fever.  HENT: Negative.   Eyes: Negative.   Respiratory: Negative.   Cardiovascular: Negative.   Gastrointestinal: Negative.   Genitourinary: Positive for frequency.       Nocturia.  Musculoskeletal: Negative.   Skin: Negative.        Open, draining spot underneath her left breast.  Neurological: Positive for tingling.       Neuropathy.  Endo/Heme/Allergies: Negative.   Psychiatric/Behavioral: Negative.   All other systems reviewed and are negative.  14 point ROS was done and is otherwise as detailed above or in HPI   PHYSICAL  EXAMINATION: ECOG PERFORMANCE STATUS: 2 - Symptomatic, <50% confined to bed  Filed Vitals:   01/23/15 1511  BP: 150/46  Pulse: 83  Temp: 97.6 F (36.4 C)  Resp: 16   Filed Weights   01/23/15 1511  Weight: 300 lb 11.2 oz (136.397 kg)     Physical Exam  Constitutional: She is oriented to person, place, and time and well-developed, well-nourished, and in no distress.  Obese. In wheelchair. She is unable to get on the examination table.  HENT:  Head: Normocephalic and atraumatic.  Mouth/Throat: Oropharynx is clear and moist. No oropharyngeal exudate.  Eyes: Conjunctivae and EOM are normal. Pupils are equal, round, and reactive to light. Right eye exhibits no discharge. Left eye exhibits no discharge. Scleral icterus is present.  Neck: Normal range of motion. Neck supple. No JVD present. No tracheal deviation present. No thyromegaly present.  Cardiovascular: Normal rate and regular rhythm.   Murmur heard. Pulmonary/Chest: Effort normal and breath sounds normal. No respiratory distress. She has no wheezes. She has no rales. She exhibits no tenderness.    Port in place.  Abdominal: Soft. Bowel sounds are normal. She exhibits no distension. There is no tenderness. There is no rebound and no guarding.  Musculoskeletal: Normal range of motion. She exhibits no tenderness.  Lymphadenopathy:    She has no cervical adenopathy.  Neurological: She is alert and oriented to person, place, and time. No cranial nerve deficit. Gait  normal.  Skin: Skin is warm and dry.  Psychiatric: Mood, memory, affect and judgment normal.  Nursing note and vitals reviewed.   LABORATORY DATA:  I have reviewed the data as listed Lab Results  Component Value Date   WBC 6.1 02/01/2015   HGB 11.2* 02/01/2015   HCT 35.9* 02/01/2015   MCV 82.9 02/01/2015   PLT 218 02/01/2015   CMP     Component Value Date/Time   NA 139 02/01/2015 0940   K 3.7 02/01/2015 0940   CL 101 02/01/2015 0940   CO2 31 02/01/2015  0940   GLUCOSE 161* 02/01/2015 0940   BUN 15 02/01/2015 0940   CREATININE 0.70 02/01/2015 0940   CALCIUM 8.8* 02/01/2015 0940   PROT 7.9 02/01/2015 0940   ALBUMIN 3.6 02/01/2015 0940   AST 20 02/01/2015 0940   ALT 19 02/01/2015 0940   ALKPHOS 98 02/01/2015 0940   BILITOT 0.5 02/01/2015 0940   GFRNONAA >60 02/01/2015 0940   GFRAA >60 02/01/2015 0940     ASSESSMENT & PLAN:  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 She needs to undergo formal chemotherapy teaching and we will arrange for this. She still has a lot of questions. She will need anti-nausea medications and EMLA cream called in. She was given reading information today.   Her breast is actively draining. I am calling her in a prescription for Keflex. I have advised her that if her breast is not improved by Monday she is to call Dr. Lucia Gaskins as she may need to see him. I advised her that I do not want to start treatment with an active infection.  We will tentatively schedule her for chemotherapy next Thursday, 12/1. I will see her the same day to re-examine her breast and to answer any questions she or her family may have prior to chemotherapy.  All questions were answered. The patient knows to call the clinic with any problems, questions or concerns.  This note was electronically signed.    This document serves as a record of services personally performed by Ancil Linsey, MD. It was created on her behalf by Arlyce Harman, a trained medical scribe. The creation of this record is based on the scribe's personal observations and the provider's statements to them. This document has been checked and approved by the attending provider.  I have reviewed the above  documentation for accuracy and completeness, and I agree with the above.  Molli Hazard, MD  02/01/2015 3:11 PM

## 2015-01-23 NOTE — Patient Instructions (Addendum)
..[facility] Discharge Instructions  RECOMMENDATIONS MADE BY THE CONSULTANT AND ANY TEST RESULTS WILL BE SENT TO YOUR REFERRING PHYSICIAN. 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  Main side-effects will be fatigue After you finish about 3 months of treatment you will start the hormonal therapy, the radiation and continue herceptin for 1 year total from start to finish  Your breast is red and draining, we will get you an antibiotic and talk to Dr. Lucia Gaskins. He needs to see this before we start chemotherapy. We will have you meet Lupita Raider our navigator today and arrange teaching  Thank you for choosing [facility] to provide your oncology and hematology care.  To afford each patient quality time with our provider, please arrive at least 15 minutes before your scheduled appointment time.    You need to re-schedule your appointment should you arrive 10 or more minutes late.  We strive to give you quality time with our providers, and arriving late affects you and other patients whose appointments are after yours.  Also, if you no show three or more times for appointments you may be dismissed from the clinic at the providers discretion.       Again, thank you for choosing [facility].  Our hope is that these requests will decrease the amount of time that you wait before being seen by our physicians.       _____________________________________________________________  Should you have questions after your visit to [facility], please  contact our office at [phone] between the hours of 8:30 a.m. and 4:30 p.m.  Voicemails left after 4:30 p.m. will not be returned until the following business day.  For prescription refill requests, have your pharmacy contact our office.   Trastuzumab injection for infusion What is this medicine? TRASTUZUMAB (tras TOO zoo mab) is a monoclonal antibody. It is used to treat breast cancer and stomach cancer. This medicine may be used for other purposes; ask your health care provider or pharmacist if you have questions. What should I tell my health care provider before I take this medicine? They need to know if you have any of these conditions: -heart disease -heart failure -infection (especially a virus infection such as chickenpox, cold sores, or herpes) -lung or breathing disease, like asthma -recent or ongoing radiation therapy -an unusual or allergic reaction to trastuzumab, benzyl alcohol, or other medications, foods, dyes, or preservatives -pregnant or trying to get pregnant -breast-feeding How should I use this medicine? This drug is given as an infusion into a vein. It is administered in a hospital or clinic by a specially trained health care professional. Talk to your pediatrician regarding the use of this medicine in children. This medicine is not approved for use in children. Overdosage: If you think you have taken too much of this medicine contact a poison control center or emergency room at once. NOTE: This medicine is only for you. Do not share this medicine with others. What if I miss  a dose? It is important not to miss a dose. Call your doctor or health care professional if you are unable to keep an appointment. What may interact with this medicine? -doxorubicin -warfarin This list may not describe all possible interactions. Give your health care provider a list of all the medicines, herbs, non-prescription drugs, or dietary supplements you use. Also tell them if you  smoke, drink alcohol, or use illegal drugs. Some items may interact with your medicine. What should I watch for while using this medicine? Visit your doctor for checks on your progress. Report any side effects. Continue your course of treatment even though you feel ill unless your doctor tells you to stop. Call your doctor or health care professional for advice if you get a fever, chills or sore throat, or other symptoms of a cold or flu. Do not treat yourself. Try to avoid being around people who are sick. You may experience fever, chills and shaking during your first infusion. These effects are usually mild and can be treated with other medicines. Report any side effects during the infusion to your health care professional. Fever and chills usually do not happen with later infusions. Do not become pregnant while taking this medicine or for 7 months after stopping it. Women should inform their doctor if they wish to become pregnant or think they might be pregnant. Women of child-bearing potential will need to have a negative pregnancy test before starting this medicine. There is a potential for serious side effects to an unborn child. Talk to your health care professional or pharmacist for more information. Do not breast-feed an infant while taking this medicine or for 7 months after stopping it. Women must use effective birth control with this medicine. What side effects may I notice from receiving this medicine? Side effects that you should report to your doctor or other health care professional as soon as possible: -breathing difficulties -chest pain or palpitations -cough -dizziness or fainting -fever or chills, sore throat -skin rash, itching or hives -swelling of the legs or ankles -unusually weak or tired Side effects that usually do not require medical attention (report to your doctor or other health care professional if they continue or are bothersome): -loss of  appetite -headache -muscle aches -nausea This list may not describe all possible side effects. Call your doctor for medical advice about side effects. You may report side effects to FDA at 1-800-FDA-1088. Where should I keep my medicine? This drug is given in a hospital or clinic and will not be stored at home. NOTE: This sheet is a summary. It may not cover all possible information. If you have questions about this medicine, talk to your doctor, pharmacist, or health care provider.    2016, Elsevier/Gold Standard. (2014-05-26 11:49:32) Nanoparticle Albumin-Bound Paclitaxel injection What is this medicine? NANOPARTICLE ALBUMIN-BOUND PACLITAXEL (Na no PAHR ti kuhl al BYOO muhn-bound PAK li TAX el) is a chemotherapy drug. It targets fast dividing cells, like cancer cells, and causes these cells to die. This medicine is used to treat advanced breast cancer and advanced lung cancer. This medicine may be used for other purposes; ask your health care provider or pharmacist if you have questions. What should I tell my health care provider before I take this medicine? They need to know if you have any of these conditions: -kidney disease -liver disease -low blood counts, like low platelets, red blood cells, or white blood cells -recent or ongoing radiation therapy -an unusual or allergic reaction to paclitaxel,  albumin, other chemotherapy, other medicines, foods, dyes, or preservatives -pregnant or trying to get pregnant -breast-feeding How should I use this medicine? This drug is given as an infusion into a vein. It is administered in a hospital or clinic by a specially trained health care professional. Talk to your pediatrician regarding the use of this medicine in children. Special care may be needed. Overdosage: If you think you have taken too much of this medicine contact a poison control center or emergency room at once. NOTE: This medicine is only for you. Do not share this medicine with  others. What if I miss a dose? It is important not to miss your dose. Call your doctor or health care professional if you are unable to keep an appointment. What may interact with this medicine? -cyclosporine -diazepam -ketoconazole -medicines to increase blood counts like filgrastim, pegfilgrastim, sargramostim -other chemotherapy drugs like cisplatin, doxorubicin, epirubicin, etoposide, teniposide, vincristine -quinidine -testosterone -vaccines -verapamil Talk to your doctor or health care professional before taking any of these medicines: -acetaminophen -aspirin -ibuprofen -ketoprofen -naproxen This list may not describe all possible interactions. Give your health care provider a list of all the medicines, herbs, non-prescription drugs, or dietary supplements you use. Also tell them if you smoke, drink alcohol, or use illegal drugs. Some items may interact with your medicine. What should I watch for while using this medicine? Your condition will be monitored carefully while you are receiving this medicine. You will need important blood work done while you are taking this medicine. This drug may make you feel generally unwell. This is not uncommon, as chemotherapy can affect healthy cells as well as cancer cells. Report any side effects. Continue your course of treatment even though you feel ill unless your doctor tells you to stop. In some cases, you may be given additional medicines to help with side effects. Follow all directions for their use. Call your doctor or health care professional for advice if you get a fever, chills or sore throat, or other symptoms of a cold or flu. Do not treat yourself. This drug decreases your body's ability to fight infections. Try to avoid being around people who are sick. This medicine may increase your risk to bruise or bleed. Call your doctor or health care professional if you notice any unusual bleeding. Be careful brushing and flossing your teeth  or using a toothpick because you may get an infection or bleed more easily. If you have any dental work done, tell your dentist you are receiving this medicine. Avoid taking products that contain aspirin, acetaminophen, ibuprofen, naproxen, or ketoprofen unless instructed by your doctor. These medicines may hide a fever. Do not become pregnant while taking this medicine. Women should inform their doctor if they wish to become pregnant or think they might be pregnant. There is a potential for serious side effects to an unborn child. Talk to your health care professional or pharmacist for more information. Do not breast-feed an infant while taking this medicine. Men are advised not to father a child while receiving this medicine. What side effects may I notice from receiving this medicine? Side effects that you should report to your doctor or health care professional as soon as possible: -allergic reactions like skin rash, itching or hives, swelling of the face, lips, or tongue -low blood counts - This drug may decrease the number of white blood cells, red blood cells and platelets. You may be at increased risk for infections and bleeding. -signs of infection - fever  or chills, cough, sore throat, pain or difficulty passing urine -signs of decreased platelets or bleeding - bruising, pinpoint red spots on the skin, black, tarry stools, nosebleeds -signs of decreased red blood cells - unusually weak or tired, fainting spells, lightheadedness -breathing problems -changes in vision -chest pain -high or low blood pressure -mouth sores -nausea and vomiting -pain, swelling, redness or irritation at the injection site -pain, tingling, numbness in the hands or feet -slow or irregular heartbeat -swelling of the ankle, feet, hands Side effects that usually do not require medical attention (report to your doctor or health care professional if they continue or are bothersome): -aches, pains -changes in the  color of fingernails -diarrhea -hair loss -loss of appetite This list may not describe all possible side effects. Call your doctor for medical advice about side effects. You may report side effects to FDA at 1-800-FDA-1088. Where should I keep my medicine? This drug is given in a hospital or clinic and will not be stored at home. NOTE: This sheet is a summary. It may not cover all possible information. If you have questions about this medicine, talk to your doctor, pharmacist, or health care provider.    2016, Elsevier/Gold Standard. (2012-04-12 16:48:50)

## 2015-01-23 NOTE — Telephone Encounter (Signed)
Discussed with Ms. Kasa that her genetic test results were negative for pathogenic mutations within any of 20 genes that would cause her to be at an increased risk for breast, ovarian, or other related cancers.  Discussed that one uncertain change was found in the PMS2 gene.  We reviewed that we just treat this result like a negative result until it gets reclassified by the lab.  Ms. Notaro should keep her phone number up-to-date with [place], so that we can inform her if/when the lab reclassifies this result.  Discussed that Ms. Micale's sister could have genetic counseling and testing, since she was also diagnosed with breast cancer.  Ms. Rothman thinks this sister may have already had genetic testing (she lives outside of [place]).  She will get in touch with her to find out more about her result; encouraged her to update [place] with this information as well.  Discussed that women in the family are still considered to be at an increased risk for breast cancer based on the family history.  They should continue annual mammogram screening.  Those who have not begun mammogram screening should begin at 94 or earlier based on their providers' recommendations.  Also discussed that Ms. Dorsi' son should let his doctor know about his uncle's prostate cancer history, so that he can receive appropriate prostate cancer screening.  Ms. Daza is welcome to call or email me with any further questions.

## 2015-01-23 NOTE — Progress Notes (Signed)
GENETIC TEST RESULT  HPI: [name] was previously seen in the [place] clinic due to a personal history of breast cancer, family history of breast and other cancers, and concerns regarding a hereditary predisposition to cancer. Please refer to our prior cancer genetics clinic note from January 09, 2015 for more information regarding [name]'s medical, social and family histories, and our assessment and recommendations, at the time. [name] recent genetic test results were disclosed to her, as were recommendations warranted by these results. These results and recommendations are discussed in more detail below.  GENETIC TEST RESULTS: At the time of [name] visit on 01/09/15, we recommended she pursue genetic testing of the 20-gene Breast/Ovarian Cancer Panel through [facility] Hope Pigeon, MD).  The Breast/Ovarian Cancer Panel offered by [facility] Hope Pigeon, MD) includes sequencing and deletion/duplication analysis for the following 19 genes:  ATM, BARD1, BRCA1, BRCA2, BRIP1, CDH1, CHEK2, FANCC, MLH1, MSH2, MSH6, NBN, PALB2, PMS2, PTEN, RAD51C, RAD51D, TP53, and XRCC2.  This panel also includes deletion/duplication analysis (without sequencing) for one gene, EPCAM.  Those results are now back, the report date for which is January 22, 2015.  Genetic testing was normal, and did not reveal a deleterious mutation in these genes.  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  The test report will be scanned into EPIC and will be located under the Results Review tab in the Pathology>Molecular Pathology section.   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 At this time, it is unknown if this VUS is associated with an increased risk for cancer or if this is a normal finding. Since this VUS  result is uncertain, it cannot help guide screening recommendations, and family members should not be tested for this VUS to help define their own cancer risks.  Also, we all have variants within our genes that make [place] unique individuals--most of these variants are benign.  Thus, we treat this VUS as a negative result.   With time, we suspect the lab will reclassify this variant and when they do, we will try to re-contact [name] to discuss the reclassification further.  We also encouraged [name] to contact us in a year or two to obtain an update on the status of this VUS.  We discussed with [name] that since the current genetic testing is not perfect, it is possible there may be a gene mutation in one of these genes that current testing cannot detect, but that chance is small. We also discussed, that it is possible that another gene that has not yet been discovered, or that we have not yet tested, is responsible for the cancer diagnoses in the family, and it is, therefore, important to remain in touch with cancer genetics in the future so that we can continue to offer [name] the most up to date genetic testing.   CANCER SCREENING RECOMMENDATIONS: Thus, we still do not have an explanation for the personal and family history of cancer.  It could be that this result is reassuring and indicates that [name] likely does not have an increased risk for a future cancer due to a mutation in one of these genes. In that case, this normal test would suggests that [name] cancer was most likely not due to an inherited predisposition associated with one of these genes.  Most cancers  happen by chance and this negative test could suggest that her cancer falls into this category.  Many of the cancers in the family were diagnosed at later ages in life, so that is somewhat reassuring.  However, it could be the case that there is a hereditary cancer syndrome in the family that [name] herself just  did not inherit.  Thus, it could also be helpful if [name]'s sister, who also has a history of breast cancer, were to have genetic counseling and testing.  It could also be the case that we are just not testing the right genes at this point in time.  We, therefore, recommended she continue to follow the cancer management and screening guidelines provided by her oncology and primary healthcare providers.   RECOMMENDATIONS FOR FAMILY MEMBERS: Women in this family might be at some increased risk of developing cancer, over the general population risk, simply due to the family history of cancer. We recommended women in this family have a yearly mammogram beginning at age 36, or 45 years younger than the earliest onset of cancer, an an annual clinical breast exam, and perform monthly breast self-exams.  This includes [name] daughters and nieces.  Women in this family should also have a gynecological exam as recommended by their primary provider. All family members should have a colonoscopy by age 48.  [name] son should make his doctor aware of the history of prostate cancer in his uncle, so that he can receive the most appropriate prostate cancer screening.    Based on [name] family history, we recommended her sister, who was diagnosed with breast cancer at age 62, have genetic counseling and testing.  [name] sister's results may be helpful in our better understanding of the personal and familial cancer risks.  If she has a positive test result, than we would feel more reassured about [name] own test result.  If [name] sister also carries this same VUS in the PMS2 gene, then we would be somewhat more suspicious of this finding.  [name] believes this sister may have already had genetic testing.  She lives outside of the [place] area, but will find out more information. [name] will let us know if we can be of any assistance in coordinating genetic counseling  and/or testing for this family member.   FOLLOW-UP: Lastly, we discussed with [name] that cancer genetics is a rapidly advancing field and it is possible that new genetic tests will be appropriate for her and/or her family members in the future. We encouraged her to remain in contact with cancer genetics on an annual basis so we can update her personal and family histories and let her know of advances in cancer genetics that may benefit this family.   Our contact number was provided. [name] questions were answered to her satisfaction, and she knows she is welcome to call us at anytime with additional questions or concerns.   Jeanine Luz, MS Genetic Counselor [email or web address] Phone: [phone]

## 2015-01-29 MED ORDER — PROCHLORPERAZINE MALEATE 10 MG PO TABS: 10.0000 mg | ORAL_TABLET | Freq: Four times a day (QID) | ORAL | Status: DC | PRN

## 2015-01-29 MED ORDER — ONDANSETRON HCL 8 MG PO TABS
8.0000 mg | ORAL_TABLET | Freq: Three times a day (TID) | ORAL | Status: DC | PRN
Start: 2015-01-29 — End: 2015-03-23

## 2015-01-29 MED ORDER — LIDOCAINE-PRILOCAINE 2.5-2.5 % EX CREA: TOPICAL_CREAM | CUTANEOUS | Status: DC

## 2015-01-29 NOTE — Patient Instructions (Addendum)
[place]   CHEMOTHERAPY INSTRUCTIONS  Premeds: Aloxi - for nausea/vomiting prevention/reduction. This will last approximately 48-72 hours in your body. (given through port)                  Tylenol - we are giving this to you prior to the Herceptin treatment to decrease the risk of you having fever/chills from the Herceptin. (given in pill form)                   Benadryl - antihistamine- given to reduce the risk of you having an allergic reaction to the Herceptin. 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 Side Effects that may occur during infusion include: chills, fever, headache, dizziness, shortness of breath, low blood pressure, rash. This does not generally happen. We will have to perform MUGA scans or 2D echoes periodically during treatment however because a side effect of this drug can be cardiotoxicity. Cardiotoxicity is a weakening of the pumping muscle of the heart. The MUGA scan or 2D echo will show [place] how strong the pumping muscle of your heart is.  You will receive these drugs once a week.    POTENTIAL SIDE EFFECTS OF TREATMENT: Increased Susceptibility to Infection, Vomiting, Constipation, Hair Thinning, Changes in Character of Skin and Nails (brittleness, dryness,etc.), Bone Marrow Suppression, Nausea, Diarrhea, Sun Sensitivity and Mouth Sores   SELF IMAGE NEEDS AND REFERRALS MADE: Referral to Look Good, Feel Better consultant paper with number provided   EDUCATIONAL MATERIALS GIVEN AND REVIEWED: Chemotherapy and You booklet Specific Instructions Sheets: Abraxane, Herceptin, Aloxi, EMLA cream, Zofran, Compazine, MUGA/2D echo   SELF CARE ACTIVITIES WHILE ON CHEMOTHERAPY: Increase your  fluid intake 48 hours prior to treatment and drink at least 2 quarts per day after treatment., No alcohol intake., No aspirin or other medications unless approved by your oncologist., Eat foods that are light and easy to digest., Eat foods at cold or room temperature., No fried, fatty, or spicy foods immediately before or after treatment., Have teeth cleaned professionally before starting treatment. Keep dentures and partial plates clean., Use soft toothbrush and do not use mouthwashes that contain alcohol. Biotene is a good mouthwash that is available at most pharmacies or may be ordered by calling [phone]., Use warm salt water gargles (1 teaspoon salt per 1 quart warm water) before and after meals and at bedtime. Or you may rinse with 2 tablespoons of three -percent hydrogen peroxide mixed in eight ounces of water., Always use sunscreen with SPF (Sun Protection Factor) of 30 or higher., Use your nausea medication as directed to prevent nausea., Use your stool softener or laxative as directed to prevent constipation. and Use your anti-diarrheal medication as directed to stop diarrhea.  Please wash your hands for at least 30 seconds using warm soapy water. Handwashing is the #1 way to prevent the spread of germs. Stay away from sick people or people who are getting over a cold. If you develop respiratory systems such as green/yellow mucus production or productive cough or persistent cough let us know and we will see if you need an antibiotic. It is a good idea to keep a pair of gloves on when going into grocery stores/[employer] to decrease your risk of coming into contact with germs  on the carts, etc. Carry alcohol hand gel with you at all times and use it frequently if out in public. All foods need to be cooked thoroughly. No raw foods. No medium or undercooked meats, eggs. If your food is cooked medium well, it does not need to be hot pink or saturated with bloody liquid at all. Vegetables and fruits need  to be washed/rinsed under the faucet with a dish detergent before being consumed. You can eat raw fruits and vegetables unless we tell you otherwise but it would be best if you cooked them or bought frozen. Do not eat off of salad bars or hot bars unless you really trust the cleanliness of the restaurant. If you need dental work, please let Dr. Whitney Muse know before you go for your appointment so that we can coordinate the best possible time for you in regards to your chemo regimen. You need to also let your dentist know that you are actively taking chemo. We may need to do labs prior to your dental appointment. We also want your bowels moving at least every other day. If this is not happening, we need to know so that we can get you on a bowel regimen to help you go. If you are going to have sex, your partner will need to wear a condom -- this is to protect your partner from potential chemotherapy exposure. This will need to occur for up to 28 days after chemo completion.       MEDICATIONS: You have been given prescriptions for the following medications:  Zofran $Remov'8mg'QntyLc$  tablet. Take 1 tablet every 8 hours as needed for nausea/vomiting. (#1 nausea med to take, this can constipate)  Compazine $RemoveBe'10mg'OIqhyrNGd$  tablet. Take 1 tablet every 6 hours as needed for nausea/vomiting. (#2 nausea med to take, this can make you sleepy)  EMLA cream. Apply a quarter size amount to port site 1 hour prior to chemo. Do not rub in. Cover with plastic wrap.   Over-the-Counter Meds:  Miralax 1 capful in 8 oz of fluid daily. May increase to two times a day if needed. This is a stool softener. If this doesn't work proceed you can add:  Senokot S  - start with 1 tablet two times a day and increase to 4 tablets two times a day if needed. (total of 8 tablets in a 24 hour period). This is a stimulant laxative.   Call us if this does not help your bowels move.   Imodium $Remove'2mg'sRKGbGj$  capsule. Take 2 capsules after the 1st loose stool and then 1  capsule every 2 hours until you go a total of 12 hours without having a loose stool. Call the [facility] if loose stools continue. If diarrhea occurs @ bedtime, take 2 capsules @ bedtime. Then take 2 capsules every 4 hours until morning. Call [facility].    SYMPTOMS TO REPORT AS SOON AS POSSIBLE AFTER TREATMENT:  FEVER GREATER THAN 100.5 F  CHILLS WITH OR WITHOUT FEVER  NAUSEA AND VOMITING THAT IS NOT CONTROLLED WITH YOUR NAUSEA MEDICATION  UNUSUAL SHORTNESS OF BREATH  UNUSUAL BRUISING OR BLEEDING  TENDERNESS IN MOUTH AND THROAT WITH OR WITHOUT PRESENCE OF ULCERS  URINARY PROBLEMS  BOWEL PROBLEMS  UNUSUAL RASH    Wear comfortable clothing and clothing appropriate for easy access to any Portacath or PICC line. Let us know if there is anything that we can do to make your therapy better!      I have been informed and understand all  of the instructions given to me and have received a copy. I have been instructed to call the clinic [phone] or my family physician as soon as possible for continued medical care, if indicated. I do not have any more questions at this time but understand that I may call the [facility] or the Patient Navigator at [phone] during office hours should I have questions or need assistance in obtaining follow-up care.           Nanoparticle Albumin-Bound Paclitaxel injection What is this medicine? NANOPARTICLE ALBUMIN-BOUND PACLITAXEL (Na no PAHR ti kuhl al BYOO muhn-bound PAK li TAX el) is a chemotherapy drug. It targets fast dividing cells, like cancer cells, and causes these cells to die. This medicine is used to treat advanced breast cancer and advanced lung cancer. This medicine may be used for other purposes; ask your health care provider or pharmacist if you have questions. What should I tell my health care provider before I take this medicine? They need to know if you have any of these conditions: -kidney disease -liver  disease -low blood counts, like low platelets, red blood cells, or white blood cells -recent or ongoing radiation therapy -an unusual or allergic reaction to paclitaxel, albumin, other chemotherapy, other medicines, foods, dyes, or preservatives -pregnant or trying to get pregnant -breast-feeding How should I use this medicine? This drug is given as an infusion into a vein. It is administered in a hospital or clinic by a specially trained health care professional. Talk to your pediatrician regarding the use of this medicine in children. Special care may be needed. Overdosage: If you think you have taken too much of this medicine contact a poison control center or emergency room at once. NOTE: This medicine is only for you. Do not share this medicine with others. What if I miss a dose? It is important not to miss your dose. Call your doctor or health care professional if you are unable to keep an appointment. What may interact with this medicine? -cyclosporine -diazepam -ketoconazole -medicines to increase blood counts like filgrastim, pegfilgrastim, sargramostim -other chemotherapy drugs like cisplatin, doxorubicin, epirubicin, etoposide, teniposide, vincristine -quinidine -testosterone -vaccines -verapamil Talk to your doctor or health care professional before taking any of these medicines: -acetaminophen -aspirin -ibuprofen -ketoprofen -naproxen This list may not describe all possible interactions. Give your health care provider a list of all the medicines, herbs, non-prescription drugs, or dietary supplements you use. Also tell them if you smoke, drink alcohol, or use illegal drugs. Some items may interact with your medicine. What should I watch for while using this medicine? Your condition will be monitored carefully while you are receiving this medicine. You will need important blood work done while you are taking this medicine. This drug may make you feel generally unwell. This  is not uncommon, as chemotherapy can affect healthy cells as well as cancer cells. Report any side effects. Continue your course of treatment even though you feel ill unless your doctor tells you to stop. In some cases, you may be given additional medicines to help with side effects. Follow all directions for their use. Call your doctor or health care professional for advice if you get a fever, chills or sore throat, or other symptoms of a cold or flu. Do not treat yourself. This drug decreases your body's ability to fight infections. Try to avoid being around people who are sick. This medicine may increase your risk to bruise or bleed. Call your doctor or health  care professional if you notice any unusual bleeding. Be careful brushing and flossing your teeth or using a toothpick because you may get an infection or bleed more easily. If you have any dental work done, tell your dentist you are receiving this medicine. Avoid taking products that contain aspirin, acetaminophen, ibuprofen, naproxen, or ketoprofen unless instructed by your doctor. These medicines may hide a fever. Do not become pregnant while taking this medicine. Women should inform their doctor if they wish to become pregnant or think they might be pregnant. There is a potential for serious side effects to an unborn child. Talk to your health care professional or pharmacist for more information. Do not breast-feed an infant while taking this medicine. Men are advised not to father a child while receiving this medicine. What side effects may I notice from receiving this medicine? Side effects that you should report to your doctor or health care professional as soon as possible: -allergic reactions like skin rash, itching or hives, swelling of the face, lips, or tongue -low blood counts - This drug may decrease the number of white blood cells, red blood cells and platelets. You may be at increased risk for infections and bleeding. -signs of  infection - fever or chills, cough, sore throat, pain or difficulty passing urine -signs of decreased platelets or bleeding - bruising, pinpoint red spots on the skin, black, tarry stools, nosebleeds -signs of decreased red blood cells - unusually weak or tired, fainting spells, lightheadedness -breathing problems -changes in vision -chest pain -high or low blood pressure -mouth sores -nausea and vomiting -pain, swelling, redness or irritation at the injection site -pain, tingling, numbness in the hands or feet -slow or irregular heartbeat -swelling of the ankle, feet, hands Side effects that usually do not require medical attention (report to your doctor or health care professional if they continue or are bothersome): -aches, pains -changes in the color of fingernails -diarrhea -hair loss -loss of appetite This list may not describe all possible side effects. Call your doctor for medical advice about side effects. You may report side effects to FDA at 1-800-FDA-1088. Where should I keep my medicine? This drug is given in a hospital or clinic and will not be stored at home. NOTE: This sheet is a summary. It may not cover all possible information. If you have questions about this medicine, talk to your doctor, pharmacist, or health care provider.    2016, Elsevier/Gold Standard. (2012-04-12 16:48:50) Trastuzumab injection for infusion What is this medicine? TRASTUZUMAB (tras TOO zoo mab) is a monoclonal antibody. It is used to treat breast cancer and stomach cancer. This medicine may be used for other purposes; ask your health care provider or pharmacist if you have questions. What should I tell my health care provider before I take this medicine? They need to know if you have any of these conditions: -heart disease -heart failure -infection (especially a virus infection such as chickenpox, cold sores, or herpes) -lung or breathing disease, like asthma -recent or ongoing radiation  therapy -an unusual or allergic reaction to trastuzumab, benzyl alcohol, or other medications, foods, dyes, or preservatives -pregnant or trying to get pregnant -breast-feeding How should I use this medicine? This drug is given as an infusion into a vein. It is administered in a hospital or clinic by a specially trained health care professional. Talk to your pediatrician regarding the use of this medicine in children. This medicine is not approved for use in children. Overdosage: If you think you have  taken too much of this medicine contact a poison control center or emergency room at once. NOTE: This medicine is only for you. Do not share this medicine with others. What if I miss a dose? It is important not to miss a dose. Call your doctor or health care professional if you are unable to keep an appointment. What may interact with this medicine? -doxorubicin -warfarin This list may not describe all possible interactions. Give your health care provider a list of all the medicines, herbs, non-prescription drugs, or dietary supplements you use. Also tell them if you smoke, drink alcohol, or use illegal drugs. Some items may interact with your medicine. What should I watch for while using this medicine? Visit your doctor for checks on your progress. Report any side effects. Continue your course of treatment even though you feel ill unless your doctor tells you to stop. Call your doctor or health care professional for advice if you get a fever, chills or sore throat, or other symptoms of a cold or flu. Do not treat yourself. Try to avoid being around people who are sick. You may experience fever, chills and shaking during your first infusion. These effects are usually mild and can be treated with other medicines. Report any side effects during the infusion to your health care professional. Fever and chills usually do not happen with later infusions. Do not become pregnant while taking this medicine or  for 7 months after stopping it. Women should inform their doctor if they wish to become pregnant or think they might be pregnant. Women of child-bearing potential will need to have a negative pregnancy test before starting this medicine. There is a potential for serious side effects to an unborn child. Talk to your health care professional or pharmacist for more information. Do not breast-feed an infant while taking this medicine or for 7 months after stopping it. Women must use effective birth control with this medicine. What side effects may I notice from receiving this medicine? Side effects that you should report to your doctor or other health care professional as soon as possible: -breathing difficulties -chest pain or palpitations -cough -dizziness or fainting -fever or chills, sore throat -skin rash, itching or hives -swelling of the legs or ankles -unusually weak or tired Side effects that usually do not require medical attention (report to your doctor or other health care professional if they continue or are bothersome): -loss of appetite -headache -muscle aches -nausea This list may not describe all possible side effects. Call your doctor for medical advice about side effects. You may report side effects to FDA at 1-800-FDA-1088. Where should I keep my medicine? This drug is given in a hospital or clinic and will not be stored at home. NOTE: This sheet is a summary. It may not cover all possible information. If you have questions about this medicine, talk to your doctor, pharmacist, or health care provider.    2016, Elsevier/Gold Standard. (2014-05-26 11:49:32) Palonosetron Injection What is this medicine? PALONOSETRON (pal oh NOE se tron) is used to prevent nausea and vomiting caused by chemotherapy. It also helps prevent delayed nausea and vomiting that may occur a few days after your treatment. This medicine may be used for other purposes; ask your health care provider or  pharmacist if you have questions. What should I tell my health care provider before I take this medicine? They need to know if you have any of these conditions: -an unusual or allergic reaction to palonosetron, dolasetron, granisetron, ondansetron,  other medicines, foods, dyes, or preservatives -pregnant or trying to get pregnant -breast-feeding How should I use this medicine? This medicine is for infusion into a vein. It is given by a health care professional in a hospital or clinic setting. Talk to your pediatrician regarding the use of this medicine in children. While this drug may be prescribed for children as young as 1 month for selected conditions, precautions do apply. Overdosage: If you think you have taken too much of this medicine contact a poison control center or emergency room at once. NOTE: This medicine is only for you. Do not share this medicine with others. What if I miss a dose? This does not apply. What may interact with this medicine? -certain medicines for depression, anxiety, or psychotic disturbances -fentanyl -linezolid -MAOIs like Carbex, Eldepryl, Marplan, Nardil, and Parnate -methylene blue (injected into a vein) -tramadol This list may not describe all possible interactions. Give your health care provider a list of all the medicines, herbs, non-prescription drugs, or dietary supplements you use. Also tell them if you smoke, drink alcohol, or use illegal drugs. Some items may interact with your medicine. What should I watch for while using this medicine? Your condition will be monitored carefully while you are receiving this medicine. What side effects may I notice from receiving this medicine? Side effects that you should report to your doctor or health care professional as soon as possible: -allergic reactions like skin rash, itching or hives, swelling of the face, lips, or tongue -breathing problems -confusion -dizziness -fast, irregular heartbeat -fever  and chills -loss of balance or coordination -seizures -sweating -swelling of the hands and feet -tremors -unusually weak or tired Side effects that usually do not require medical attention (report to your doctor or health care professional if they continue or are bothersome): -constipation or diarrhea -headache This list may not describe all possible side effects. Call your doctor for medical advice about side effects. You may report side effects to FDA at 1-800-FDA-1088. Where should I keep my medicine? This drug is given in a hospital or clinic and will not be stored at home. NOTE: This sheet is a summary. It may not cover all possible information. If you have questions about this medicine, talk to your doctor, pharmacist, or health care provider.    2016, Elsevier/Gold Standard. (2012-12-24 10:38:36) Lidocaine; Prilocaine cream What is this medicine? LIDOCAINE; PRILOCAINE (LYE doe kane; PRIL oh kane) is a topical anesthetic that causes loss of feeling in the skin and surrounding tissues. It is used to numb the skin before procedures or injections. This medicine may be used for other purposes; ask your health care provider or pharmacist if you have questions. What should I tell my health care provider before I take this medicine? They need to know if you have any of these conditions: -glucose-6-phosphate deficiencies -heart disease -kidney or liver disease -methemoglobinemia -an unusual or allergic reaction to lidocaine, prilocaine, other medicines, foods, dyes, or preservatives -pregnant or trying to get pregnant -breast-feeding How should I use this medicine? This medicine is for external use only on the skin. Do not take by mouth. Follow the directions on the prescription label. Wash hands before and after use. Do not use more or leave in contact with the skin longer than directed. Do not apply to eyes or open wounds. It can cause irritation and blurred or temporary loss of  vision. If this medicine comes in contact with your eyes, immediately rinse the eye with water. Do not touch  or rub the eye. Contact your health care provider right away. Talk to your pediatrician regarding the use of this medicine in children. While this medicine may be prescribed for children for selected conditions, precautions do apply. Overdosage: If you think you have taken too much of this medicine contact a poison control center or emergency room at once. NOTE: This medicine is only for you. Do not share this medicine with others. What if I miss a dose? This medicine is usually only applied once prior to each procedure. It must be in contact with the skin for a period of time for it to work. If you applied this medicine later than directed, tell your health care professional before starting the procedure. What may interact with this medicine? -acetaminophen -chloroquine -dapsone -medicines to control heart rhythm -nitrates like nitroglycerin and nitroprusside -other ointments, creams, or sprays that may contain anesthetic medicine -phenobarbital -phenytoin -quinine -sulfonamides like sulfacetamide, sulfamethoxazole, sulfasalazine and others This list may not describe all possible interactions. Give your health care provider a list of all the medicines, herbs, non-prescription drugs, or dietary supplements you use. Also tell them if you smoke, drink alcohol, or use illegal drugs. Some items may interact with your medicine. What should I watch for while using this medicine? Be careful to avoid injury to the treated area while it is numb and you are not aware of pain. Avoid scratching, rubbing, or exposing the treated area to hot or cold temperatures until complete sensation has returned. The numb feeling will wear off a few hours after applying the cream. What side effects may I notice from receiving this medicine? Side effects that you should report to your doctor or health care  professional as soon as possible: -blurred vision -chest pain -difficulty breathing -dizziness -drowsiness -fast or irregular heartbeat -skin rash or itching -swelling of your throat, lips, or face -trembling Side effects that usually do not require medical attention (report to your doctor or health care professional if they continue or are bothersome): -changes in ability to feel hot or cold -redness and swelling at the application site This list may not describe all possible side effects. Call your doctor for medical advice about side effects. You may report side effects to FDA at 1-800-FDA-1088. Where should I keep my medicine? Keep out of reach of children. Store at room temperature between 15 and 30 degrees C (59 and 86 degrees F). Keep container tightly closed. Throw away any unused medicine after the expiration date. NOTE: This sheet is a summary. It may not cover all possible information. If you have questions about this medicine, talk to your doctor, pharmacist, or health care provider.    2016, Elsevier/Gold Standard. (2007-08-23 17:14:35) Ondansetron injection What is this medicine? ONDANSETRON (on DAN se tron) is used to treat nausea and vomiting caused by chemotherapy. It is also used to prevent or treat nausea and vomiting after surgery. This medicine may be used for other purposes; ask your health care provider or pharmacist if you have questions. What should I tell my health care provider before I take this medicine? They need to know if you have any of these conditions: -heart disease -history of irregular heartbeat -liver disease -low levels of magnesium or potassium in the blood -an unusual or allergic reaction to ondansetron, granisetron, other medicines, foods, dyes, or preservatives -pregnant or trying to get pregnant -breast-feeding How should I use this medicine? This medicine is for infusion into a vein. It is given by a health care professional  in a  hospital or clinic setting. Talk to your pediatrician regarding the use of this medicine in children. Special care may be needed. Overdosage: If you think you have taken too much of this medicine contact a poison control center or emergency room at once. NOTE: This medicine is only for you. Do not share this medicine with others. What if I miss a dose? This does not apply. What may interact with this medicine? Do not take this medicine with any of the following medications: -apomorphine -certain medicines for fungal infections like fluconazole, itraconazole, ketoconazole, posaconazole, voriconazole -cisapride -dofetilide -dronedarone -pimozide -thioridazine -ziprasidone This medicine may also interact with the following medications: -carbamazepine -certain medicines for depression, anxiety, or psychotic disturbances -fentanyl -linezolid -MAOIs like Carbex, Eldepryl, Marplan, Nardil, and Parnate -methylene blue (injected into a vein) -other medicines that prolong the QT interval (cause an abnormal heart rhythm) -phenytoin -rifampicin -tramadol This list may not describe all possible interactions. Give your health care provider a list of all the medicines, herbs, non-prescription drugs, or dietary supplements you use. Also tell them if you smoke, drink alcohol, or use illegal drugs. Some items may interact with your medicine. What should I watch for while using this medicine? Your condition will be monitored carefully while you are receiving this medicine. What side effects may I notice from receiving this medicine? Side effects that you should report to your doctor or health care professional as soon as possible: -allergic reactions like skin rash, itching or hives, swelling of the face, lips, or tongue -breathing problems -confusion -dizziness -fast or irregular heartbeat -feeling faint or lightheaded, falls -fever and chills -loss of balance or  coordination -seizures -sweating -swelling of the hands and feet -tightness in the chest -tremors -unusually weak or tired Side effects that usually do not require medical attention (report to your doctor or health care professional if they continue or are bothersome): -constipation or diarrhea -headache This list may not describe all possible side effects. Call your doctor for medical advice about side effects. You may report side effects to FDA at 1-800-FDA-1088. Where should I keep my medicine? This drug is given in a hospital or clinic and will not be stored at home. NOTE: This sheet is a summary. It may not cover all possible information. If you have questions about this medicine, talk to your doctor, pharmacist, or health care provider.    2016, Elsevier/Gold Standard. (2012-11-24 16:18:28) Prochlorperazine injection What is this medicine? PROCHLORPERAZINE (proe klor PER a zeen) helps to control severe nausea and vomiting. This medicine is also used to treat schizophrenia. It can also help patients who experience anxiety that is not due to psychological illness. This medicine may be used for other purposes; ask your health care provider or pharmacist if you have questions. What should I tell my health care provider before I take this medicine? They need to know if you have any of these conditions: -blood disorders or disease -dementia -liver disease or jaundice -Parkinson's disease -uncontrollable movement disorder -an unusual or allergic reaction to prochlorperazine, other medicines, foods, dyes, or preservatives -pregnant or trying to get pregnant -breast-feeding How should I use this medicine? This medicine is for injection into a muscle, or injection or infusion into a vein. It is given by a health care professional in a hospital or clinic setting. Talk to your pediatrician regarding the use of this medicine in children. While this drug may be prescribed for children as  young as 74 years of age for selected conditions,  precautions do apply. Overdosage: If you think you have taken too much of this medicine contact a poison control center or emergency room at once. NOTE: This medicine is only for you. Do not share this medicine with others. What if I miss a dose? This does not apply. What may interact with this medicine? Do not take this medicine with any of the following medications: -amoxapine -antidepressants like citalopram, escitalopram, fluoxetine, paroxetine, and sertraline -deferoxamine -dofetilide -maprotiline -tricyclic antidepressants like amitriptyline, clomipramine, imipramine, nortriptyline and others This medicine may also interact with the following medications: -lithium -medicines for pain -phenytoin -propranolol -warfarin This list may not describe all possible interactions. Give your health care provider a list of all the medicines, herbs, non-prescription drugs, or dietary supplements you use. Also tell them if you smoke, drink alcohol, or use illegal drugs. Some items may interact with your medicine. What should I watch for while using this medicine? Your condition will be monitored carefully while you are receiving this medicine. You may get drowsy or dizzy. Do not drive, use machinery, or do anything that needs mental alertness until you know how this medicine affects you. Do not stand or sit up quickly, especially if you are an older patient. This reduces the risk of dizzy or fainting spells. Alcohol may interfere with the effect of this medicine. Avoid alcoholic drinks. This medicine can reduce the response of your body to heat or cold. Dress warm in cold weather and stay hydrated in hot weather. If possible, avoid extreme temperatures like saunas, hot tubs, very hot or cold showers, or activities that can cause dehydration such as vigorous exercise. This medicine can make you more sensitive to the sun. Keep out of the sun. If you  cannot avoid being in the sun, wear protective clothing and use sunscreen. Do not use sun lamps or tanning beds/booths. Your mouth may get dry. Chewing sugarless gum or sucking hard candy, and drinking plenty of water may help. Contact your doctor if the problem does not go away or is severe. What side effects may I notice from receiving this medicine? Side effects that you should report to your doctor or health care professional as soon as possible: -abnormal production of milk in females -allergic reactions like skin rash, itching or hives, swelling of the face, lips, or tongue -blurred vision -breast enlargement in both males and females -breathing problems -chest pain, fast or irregular heartbeat -confusion, restlessness -dark yellow or brown urine -dizziness or fainting spells -drooling, shaking, movement difficulty, or rigidity -fever, chills, sore throat -involuntary or uncontrollable movements of the eyes, mouth, head, arms, and legs -seizures -stomach area pain -unusually weak or tired -unusual bleeding or bruising -yellowing of skin or eyes Side effects that usually do not require medical attention (report to your doctor or health care professional if they continue or are bothersome): -difficulty passing urine -difficulty sleeping -headache -sexual dysfunction This list may not describe all possible side effects. Call your doctor for medical advice about side effects. You may report side effects to FDA at 1-800-FDA-1088. Where should I keep my medicine? This drug is given in a hospital or clinic and will not be stored at home. NOTE: This sheet is a summary. It may not cover all possible information. If you have questions about this medicine, talk to your doctor, pharmacist, or health care provider.    2016, Elsevier/Gold Standard. (2011-07-08 16:58:03)

## 2015-01-30 ENCOUNTER — Encounter: Payer: Self-pay | Admitting: *Deleted

## 2015-01-30 ENCOUNTER — Encounter (HOSPITAL_BASED_OUTPATIENT_CLINIC_OR_DEPARTMENT_OTHER): Payer: Medicare Other

## 2015-01-30 DIAGNOSIS — C50412 Malignant neoplasm of upper-outer quadrant of left female breast: Secondary | ICD-10-CM | POA: Diagnosis present

## 2015-01-30 NOTE — Progress Notes (Signed)
Chemo teaching done and consent signed for Herceptin/Abraxane. Distress screening done. Referral to Angie made for [employer] gift card potentially. Chemo calendar given to patient.

## 2015-01-30 NOTE — Progress Notes (Signed)
  [facility] Clinical Social Work  Clinical Social Work was referred by patient navigator for assessment of psychosocial needs due to new pt starting treatment for breast cancer. Clinical Social Worker met with pt at chemo teaching to introduce self, explain role of CSW and support programs. Pt aware to reach out as needed and was provided with CSW contact info, support group, [facility] and Duanne Limerick info. Pt and daughter deny current concerns, but agree to reach out as needed.   Clinical Social Work interventions:  Resource education  Loren Racer, [place] Tuesdays   [phone]

## 2015-01-31 NOTE — Progress Notes (Signed)
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), one positive sentinel node, intranodal tumor deposit is 1.9cm with extracapsular extension L upper outer quadrant +LVI   SUMMARY OF ONCOLOGIC HISTORY:   Breast cancer of upper-outer quadrant of left female breast ([place])   11/01/2014 Mammogram Possible mass in the left breast upper outer quadrant measuring 13 mm suspicious for breast cancer confirmed through ultrasound a spiculated hypoechoic mass ill-defined, no enlarged lymph nodes   11/01/2014 Initial Diagnosis Invasive ductal carcinoma, moderately differentiated, ER > 90%, PR> 90%, HER-2 -2+ by IHC, ratio 1.15, KI 67: 29%, T1 cN0 stage IA clinical stage   12/25/2014 Surgery Left lumpectomy: IDC 1.7 cm, positive for LVI, with DCIS, 1/1 sentinel node positive deposit 1.9 cm with extracapsular extension, ER 90%, PR 90%, HER-2 positive ratio 2.4, Ki-67 29% T1 cN1 stage II a    CURRENT THERAPY: Abraxane/Herceptin to start today 02/01/2015  INTERVAL HISTORY: Kristy Harris 69 y.o. female returns for follow-up of stage IIa ER+, PR+ HER 2 neu positive carcinoma of the L breast. She is doing better. Her breast is healed with no further redness, warmth or drainage. She is here today to start chemotherapy.  She has no major complaints or concerns today.  MEDICAL HISTORY: Past Medical History  Diagnosis Date  . Diabetes mellitus   . Morbid obesity ([place])   . Recurrent boils     of perineal and buttocks  . Hypertension   . Hyperlipidemia   . COPD (chronic obstructive pulmonary disease) ([place])   . Enlarged heart   . Anemia   . Arthritis   . Heart murmur   . Urinary frequency   . Sleep apnea     no cpap used since weight loss  . H. pylori infection   . Diverticulosis april 2016  . Hidradenitis suppurativa   . PONV (postoperative nausea and vomiting)   . Aortic stenosis      mild AS 11/24/14 echo  . Shortness of breath dyspnea     with exertion  . Pneumonia years ago  . GERD (gastroesophageal reflux disease)   . Breast cancer ([place]) 11/01/14    left breast     has Dyspnea; Hypertension; Hyperlipidemia; Obesity; Bruit; Murmur; Abnormal cardiovascular function study; Peripheral vascular disease ([place]); Diabetes mellitus ([place]); Arteritis ([place]); Abscess of buttock, right; Abscess of left thigh; Abscess of left axilla; Iron deficiency anemia; Abscess of skin and subcutaneous tissue; H. pylori infection; Diverticulosis of colon with hemorrhage; Breast cancer of upper-outer quadrant of left female breast ([place]); Encounter for pre-operative cardiovascular clearance; Aortic stenosis; Normal coronary arteries; Morbid obesity ([place]); DJD (degenerative joint disease); Family history of breast cancer; and Genetic testing on her problem list.     is allergic to lisinopril and penicillins.   SURGICAL HISTORY: Past Surgical History  Procedure Laterality Date  . Pilonidal cyst excision    . Bladder suspension      x 2  . Abcess drainage      on bottom  . Cardiac catheterization  02/21/2011    Normal coronary arteries, normal EF  . Tonsillectomy  yrs ago  . Irrigation and debridement abscess Right 12/23/2012    Procedure: incision  AND DEBRIDEMENT right buttock infection ;  Surgeon: Shann Medal, MD;  Location: [facility];  Service: General;  Laterality: Right;  . Hydradenitis excision Left 12/08/2013  Procedure: EXCISION HIDRADENITIS AXILLA;  Surgeon: Alphonsa Overall, MD;  Location: [facility];  Service: General;  Laterality: Left;  . Excision hydradenitis labia N/A 12/08/2013    Procedure: EXCISION HIDRADENITIS PUBIC AREA;  Surgeon: Alphonsa Overall, MD;  Location: [facility];  Service: General;  Laterality: N/A;  . Breast lumpectomy with radioactive seed and sentinel lymph node biopsy Left 12/25/2014    Procedure: RADIOACTIVE SEED GIUDED LEFT BREAST LUMPECTOMY, LEFT AXILLARY SENTINEL  LYMPH NODE BIOPSY;  Surgeon: Alphonsa Overall, MD;  Location: [facility];  Service: General;  Laterality: Left;  . Abdominal hysterectomy  25 yrs ago    partial  . Portacath placement N/A 01/12/2015    Procedure: INSERTION PORT-A-CATH;  Surgeon: Alphonsa Overall, MD;  Location: [facility];  Service: General;  Laterality: N/A;    SOCIAL HISTORY: Social History   Social History  . Marital Status: Widowed    Spouse Name: N/A  . Number of Children: 5  . Years of Education: N/A   Occupational History  . Textile work     Disabled   Social History Main Topics  . Smoking status: Former Smoker -- 1.00 packs/day for 10 years    Types: Cigarettes    Quit date: 03/03/1994  . Smokeless tobacco: Never Used  . Alcohol Use: No  . Drug Use: No  . Sexual Activity: Not on file   Other Topics Concern  . Not on file   Social History Narrative   Lives with grandson.  Widowed.  Has 2 sons and 3 daughters  Widowed 11 years 40 children, oldest is 34 yo 8 grandchildren 1 great grandchild Worked in a factory She used to enjoy fishing and play softball. Ex smoker, quit 20 years ago. ETOH, none  FAMILY HISTORY: Family History  Problem Relation Age of Onset  . Heart attack Mother     had multiple health problems  . Lung cancer Father 34    smoker  . Prostate cancer Brother   . Lung cancer Brother     dx. 83s; smoker  . Throat cancer Brother     dx. 73s; smoker  . Diabetes Sister     has 3 living sisters with multiple health problems  . Hypertension Brother   . Hypertension Son   . Hypertension Daughter   . Breast cancer Sister 76  . Breast cancer Sister     dx. 50s  . Breast cancer Maternal Grandmother     dx. 18s  . Breast cancer Maternal Aunt     dx. older than 85  . Dementia Maternal Aunt   . Breast cancer Cousin     dx. 22s  Mother died at 13 of a heart attack, she had a large heart and a hole in her heart. Non-smoker Father died at 75 of lung cancer, he was a smoker. 9 siblings, 4  living. One sister with breast cancer bilaterally, she is still living Another sister with breast cancer  Review of Systems  Constitutional: Positive for malaise/fatigue.  HENT: Negative.   Eyes: Negative.   Cardiovascular: Negative.   Gastrointestinal: Negative.   Genitourinary: Negative.   Musculoskeletal: Positive for joint pain.  Skin: Negative.   Neurological: Negative.   Psychiatric/Behavioral: Negative.     PHYSICAL EXAMINATION  ECOG PERFORMANCE STATUS: 2 - Symptomatic, <50% confined to bed  There were no vitals filed for this visit.  Physical Exam  Constitutional: She is oriented to person, place, and time and well-developed, well-nourished, and in no distress. No distress.  Obese,  pleasant  HENT:  Head: Normocephalic and atraumatic.  Nose: Nose normal.  Mouth/Throat: Oropharynx is clear and moist. No oropharyngeal exudate.  Eyes: Conjunctivae and EOM are normal. Pupils are equal, round, and reactive to light. Right eye exhibits no discharge. Left eye exhibits no discharge. No scleral icterus.  Neck: Normal range of motion. Neck supple. No tracheal deviation present. No thyromegaly present.  Cardiovascular: Normal rate, regular rhythm and normal heart sounds.  Exam reveals no gallop and no friction rub.   No murmur heard. Pulmonary/Chest: Effort normal and breath sounds normal. She has no wheezes. She has no rales.  Abdominal: Soft. Bowel sounds are normal. She exhibits no distension and no mass. There is no tenderness. There is no rebound and no guarding.  Musculoskeletal: She exhibits no edema.  Lymphadenopathy:    She has no cervical adenopathy.  Neurological: She is alert and oriented to person, place, and time. No cranial nerve deficit. She exhibits normal muscle tone. Coordination normal.  Skin: Skin is warm and dry. No rash noted. She is not diaphoretic.  Psychiatric: Mood, memory, affect and judgment normal.  Nursing note and vitals reviewed.   LABORATORY  DATA: I have reviewed the data as listed. Results for JENNESS, STEMLER (MRN [id number])   Ref. Range 02/01/2015 09:40  Sodium Latest Ref Range: 135-145 mmol/L 139  Potassium Latest Ref Range: 3.5-5.1 mmol/L 3.7  Chloride Latest Ref Range: 101-111 mmol/L 101  CO2 Latest Ref Range: 22-32 mmol/L 31  BUN Latest Ref Range: 6-20 mg/dL 15  Creatinine Latest Ref Range: 0.44-1.00 mg/dL 0.70  Calcium Latest Ref Range: 8.9-10.3 mg/dL 8.8 (L)  EGFR (Non-African Amer.) Latest Ref Range: >60 mL/min >60  EGFR (African American) Latest Ref Range: >60 mL/min >60  Glucose Latest Ref Range: 65-99 mg/dL 161 (H)  Anion gap Latest Ref Range: 5-15  7  Alkaline Phosphatase Latest Ref Range: 38-126 U/L 98  Albumin Latest Ref Range: 3.5-5.0 g/dL 3.6  AST Latest Ref Range: 15-41 U/L 20  ALT Latest Ref Range: 14-54 U/L 19  Total Protein Latest Ref Range: 6.5-8.1 g/dL 7.9  Total Bilirubin Latest Ref Range: 0.3-1.2 mg/dL 0.5  WBC Latest Ref Range: 4.0-10.5 K/uL 6.1  RBC Latest Ref Range: 3.87-5.11 MIL/uL 4.33  Hemoglobin Latest Ref Range: 12.0-15.0 g/dL 11.2 (L)  HCT Latest Ref Range: 36.0-46.0 % 35.9 (L)  MCV Latest Ref Range: 78.0-100.0 fL 82.9  MCH Latest Ref Range: 26.0-34.0 pg 25.9 (L)  MCHC Latest Ref Range: 30.0-36.0 g/dL 31.2  RDW Latest Ref Range: 11.5-15.5 % 15.2  Platelets Latest Ref Range: 150-400 K/uL 218  Neutrophils Latest Units: % 54  Lymphocytes Latest Units: % 40  Monocytes Relative Latest Units: % 4  Eosinophil Latest Units: % 2  Basophil Latest Units: % 0  NEUT# Latest Ref Range: 1.7-7.7 K/uL 3.3  Lymphocyte # Latest Ref Range: 0.7-4.0 K/uL 2.4  Monocyte # Latest Ref Range: 0.1-1.0 K/uL 0.3  Eosinophils Absolute Latest Ref Range: 0.0-0.7 K/uL 0.1  Basophils Absolute Latest Ref Range: 0.0-0.1 K/uL 0.0    RADIOGRAPHIC STUDIES: CLINICAL DATA: Status post port placement  EXAM: PORTABLE CHEST - 1 VIEW  COMPARISON: 12/08/2013  FINDINGS: Cardiac shadow remains enlarged. A new  right-sided chest wall port is noted with the catheter tip at the cavoatrial junction. Downward depression of the catheter is noted at the first costoclavicular space. Mild atelectasis is noted bilaterally due to a poor inspiratory effort. Some vascular crowding is noted as well. No pneumothorax is seen.  IMPRESSION: No evidence of  pneumothorax.  Poor inspiratory effort with bilateral atelectatic changes.  Downward depression of the catheter as described. This may predispose the catheter to potential pinch off syndrome.   Electronically Signed  By: Inez Catalina M.D.  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 She has undergone teaching. She has no additional questions. Her breast is healed well.    We will see her back in one week to assess tolerance prior to cycle #2.   I emphasized the importance of calling us with problems or concerns.   All questions were answered. The patient knows to call the clinic with any problems, questions or concerns. We can certainly see the patient much sooner if necessary.  This document serves as a record of services personally performed by Ancil Linsey, MD. It was created on her behalf by Arlyce Harman, a trained medical scribe. The creation of this record is based on the scribe's personal observations and the provider's statements to them. This document has been checked and approved by the attending provider.  I have reviewed the above documentation for accuracy and completeness, and I agree with the above.  This note was electronically signed. Molli Hazard,  MD  01/31/2015

## 2015-02-01 ENCOUNTER — Encounter (HOSPITAL_BASED_OUTPATIENT_CLINIC_OR_DEPARTMENT_OTHER): Payer: Medicare Other | Admitting: Hematology & Oncology

## 2015-02-01 ENCOUNTER — Encounter (HOSPITAL_COMMUNITY): Payer: Medicare Other | Attending: Hematology & Oncology

## 2015-02-01 ENCOUNTER — Encounter (HOSPITAL_COMMUNITY): Payer: Self-pay | Admitting: Hematology & Oncology

## 2015-02-01 VITALS — BP 125/47 | HR 91 | Temp 98.4°F | Resp 20

## 2015-02-01 VITALS — BP 159/83 | HR 79 | Temp 98.1°F | Resp 18 | Wt 301.2 lb

## 2015-02-01 DIAGNOSIS — L738 Other specified follicular disorders: Secondary | ICD-10-CM | POA: Insufficient documentation

## 2015-02-01 DIAGNOSIS — Z5111 Encounter for antineoplastic chemotherapy: Secondary | ICD-10-CM | POA: Diagnosis not present

## 2015-02-01 DIAGNOSIS — D509 Iron deficiency anemia, unspecified: Secondary | ICD-10-CM | POA: Insufficient documentation

## 2015-02-01 DIAGNOSIS — Z5112 Encounter for antineoplastic immunotherapy: Secondary | ICD-10-CM

## 2015-02-01 DIAGNOSIS — E119 Type 2 diabetes mellitus without complications: Secondary | ICD-10-CM

## 2015-02-01 DIAGNOSIS — C50412 Malignant neoplasm of upper-outer quadrant of left female breast: Secondary | ICD-10-CM | POA: Insufficient documentation

## 2015-02-01 DIAGNOSIS — C773 Secondary and unspecified malignant neoplasm of axilla and upper limb lymph nodes: Secondary | ICD-10-CM

## 2015-02-01 DIAGNOSIS — E669 Obesity, unspecified: Secondary | ICD-10-CM | POA: Diagnosis not present

## 2015-02-01 DIAGNOSIS — D649 Anemia, unspecified: Secondary | ICD-10-CM | POA: Diagnosis present

## 2015-02-01 DIAGNOSIS — E1165 Type 2 diabetes mellitus with hyperglycemia: Secondary | ICD-10-CM

## 2015-02-01 LAB — CBC WITH DIFFERENTIAL/PLATELET
BASOS PCT: 0 %
Basophils Absolute: 0 10*3/uL (ref 0.0–0.1)
Eosinophils Absolute: 0.1 10*3/uL (ref 0.0–0.7)
Eosinophils Relative: 2 %
HEMATOCRIT: 35.9 % — AB (ref 36.0–46.0)
HEMOGLOBIN: 11.2 g/dL — AB (ref 12.0–15.0)
LYMPHS ABS: 2.4 10*3/uL (ref 0.7–4.0)
Lymphocytes Relative: 40 %
MCH: 25.9 pg — AB (ref 26.0–34.0)
MCHC: 31.2 g/dL (ref 30.0–36.0)
MCV: 82.9 fL (ref 78.0–100.0)
MONOS PCT: 4 %
Monocytes Absolute: 0.3 10*3/uL (ref 0.1–1.0)
NEUTROS ABS: 3.3 10*3/uL (ref 1.7–7.7)
NEUTROS PCT: 54 %
Platelets: 218 10*3/uL (ref 150–400)
RBC: 4.33 MIL/uL (ref 3.87–5.11)
RDW: 15.2 % (ref 11.5–15.5)
WBC: 6.1 10*3/uL (ref 4.0–10.5)

## 2015-02-01 LAB — COMPREHENSIVE METABOLIC PANEL
ALBUMIN: 3.6 g/dL (ref 3.5–5.0)
ALK PHOS: 98 U/L (ref 38–126)
ALT: 19 U/L (ref 14–54)
ANION GAP: 7 (ref 5–15)
AST: 20 U/L (ref 15–41)
BILIRUBIN TOTAL: 0.5 mg/dL (ref 0.3–1.2)
BUN: 15 mg/dL (ref 6–20)
CALCIUM: 8.8 mg/dL — AB (ref 8.9–10.3)
CO2: 31 mmol/L (ref 22–32)
CREATININE: 0.7 mg/dL (ref 0.44–1.00)
Chloride: 101 mmol/L (ref 101–111)
GFR calc Af Amer: >60 mL/min (ref >60)
GFR calc non Af Amer: >60 mL/min (ref >60)
GLUCOSE: 161 mg/dL — AB (ref 65–99)
Potassium: 3.7 mmol/L (ref 3.5–5.1)
Sodium: 139 mmol/L (ref 135–145)
TOTAL PROTEIN: 7.9 g/dL (ref 6.5–8.1)

## 2015-02-01 MED ORDER — SODIUM CHLORIDE 0.9 % IJ SOLN
10.0000 mL | INTRAMUSCULAR | Status: DC | PRN
  Administered 2015-02-01: 10 mL
  Filled 2015-02-01: qty 10

## 2015-02-01 MED ORDER — PALONOSETRON HCL INJECTION 0.25 MG/5ML
0.2500 mg | Freq: Once | INTRAVENOUS | Status: AC
  Administered 2015-02-01: 0.25 mg via INTRAVENOUS
  Filled 2015-02-01: qty 5

## 2015-02-01 MED ORDER — PACLITAXEL PROTEIN-BOUND CHEMO INJECTION 100 MG
100.0000 mg/m2 | Freq: Once | INTRAVENOUS | Status: AC
  Administered 2015-02-01: 250 mg via INTRAVENOUS
  Filled 2015-02-01: qty 50

## 2015-02-01 MED ORDER — TRASTUZUMAB CHEMO INJECTION 440 MG
4.0000 mg/kg | Freq: Once | INTRAVENOUS | Status: AC
  Administered 2015-02-01: 546 mg via INTRAVENOUS
  Filled 2015-02-01: qty 26

## 2015-02-01 MED ORDER — SODIUM CHLORIDE 0.9 % IV SOLN
Freq: Once | INTRAVENOUS | Status: AC
  Administered 2015-02-01: 10:00:00 via INTRAVENOUS

## 2015-02-01 MED ORDER — DIPHENHYDRAMINE HCL 25 MG PO CAPS
50.0000 mg | ORAL_CAPSULE | Freq: Once | ORAL | Status: AC
  Administered 2015-02-01: 50 mg via ORAL
  Filled 2015-02-01: qty 2

## 2015-02-01 MED ORDER — ACETAMINOPHEN 325 MG PO TABS
650.0000 mg | ORAL_TABLET | Freq: Once | ORAL | Status: AC
  Administered 2015-02-01: 650 mg via ORAL
  Filled 2015-02-01: qty 2

## 2015-02-01 MED ORDER — HEPARIN SOD (PORK) LOCK FLUSH 100 UNIT/ML IV SOLN
500.0000 [IU] | Freq: Once | INTRAVENOUS | Status: AC | PRN
  Administered 2015-02-01: 500 [IU]
  Filled 2015-02-01: qty 5

## 2015-02-01 NOTE — Patient Instructions (Addendum)
[place] at [facility] Discharge Instructions  RECOMMENDATIONS MADE BY THE CONSULTANT AND ANY TEST RESULTS WILL BE SENT TO YOUR REFERRING PHYSICIAN.   Exam completed by Dr Whitney Muse today Return to see the doctor next week after your treatment to see how you done. First chemotherapy today! If your nausea medication is not working then please let us know and we can get you something different. We will call to check on you tomorrow Please call the clinic if you have any questions or concerns   Thank you for choosing [place] at [facility] to provide your oncology and hematology care.  To afford each patient quality time with our provider, please arrive at least 15 minutes before your scheduled appointment time.    You need to re-schedule your appointment should you arrive 10 or more minutes late.  We strive to give you quality time with our providers, and arriving late affects you and other patients whose appointments are after yours.  Also, if you no show three or more times for appointments you may be dismissed from the clinic at the providers discretion.     Again, thank you for choosing [facility].  Our hope is that these requests will decrease the amount of time that you wait before being seen by our physicians.       _____________________________________________________________  Should you have questions after your visit to [facility], please contact our office at [phone] between the hours of 8:30 a.m. and 4:30 p.m.  Voicemails left after 4:30 p.m. will not be returned until the following business day.  For prescription refill requests, have your pharmacy contact our office.

## 2015-02-01 NOTE — Progress Notes (Signed)
30 patients daughter in law reports that patient was complaining of being SOB. Went to room and patient said that she was a little SOB but used her inhaler and feels a little better. Patient complains of being very cold and said she had been shaking. VSS. O2 sat 93%. Patient denies any other complaints. Herceptin is complete. Reported complaints to Dr.Penland. Instructed to reassure patient this is not an adverse event. Will continue to monitor patient for any complaints.  Tolerated chemo well. No complaints voiced. Discharged home with family via wheelchair.

## 2015-02-01 NOTE — Patient Instructions (Signed)
[facility] Discharge Instructions for Patients Receiving Chemotherapy  Today you received the following chemotherapy agents Herceptin and Abraxane.  To help prevent nausea and vomiting after your treatment, we encourage you to take your nausea medication as instructed.  If you develop nausea and vomiting that is not controlled by your nausea medication, call the clinic. If it is after clinic hours your family physician or the after hours number for the clinic or go to the Emergency Department.  BELOW ARE SYMPTOMS THAT SHOULD BE REPORTED IMMEDIATELY:  *FEVER GREATER THAN 101.0 F  *CHILLS WITH OR WITHOUT FEVER  NAUSEA AND VOMITING THAT IS NOT CONTROLLED WITH YOUR NAUSEA MEDICATION  *UNUSUAL SHORTNESS OF BREATH  *UNUSUAL BRUISING OR BLEEDING  TENDERNESS IN MOUTH AND THROAT WITH OR WITHOUT PRESENCE OF ULCERS  *URINARY PROBLEMS  *BOWEL PROBLEMS  UNUSUAL RASH Items with * indicate a potential emergency and should be followed up as soon as possible.  One of the nurses will contact you 24 hours after your treatment. Please let the nurse know about any problems that you may have experienced. Feel free to call the clinic you have any questions or concerns. The clinic phone number is [phone]. Return as scheduled.  I have been informed and understand all the instructions given to me. I know to contact the clinic, my physician, or go to the Emergency Department if any problems should occur. I do not have any questions at this time, but understand that I may call the clinic during office hours or the Patient Navigator at [phone] should I have any questions or need assistance in obtaining follow up care.    __________________________________________  _____________  __________ Signature of Patient or Authorized Representative            Date                   Time    __________________________________________ Nurse's Signature

## 2015-02-02 ENCOUNTER — Telehealth (HOSPITAL_COMMUNITY): Payer: Self-pay | Admitting: *Deleted

## 2015-02-02 ENCOUNTER — Other Ambulatory Visit: Payer: Self-pay | Admitting: Gastroenterology

## 2015-02-02 NOTE — Telephone Encounter (Signed)
Spoke with patient. Reports she was just really tired when she got home yesterday after chemo and she just went to bed. Reports she was nauseated this morning when she got up but took a prochlorperazine and the nausea subsided. Denies any further complaints post chemo. Instructed to call clinic with any issues/concerns. Return as scheduled.

## 2015-02-06 NOTE — Telephone Encounter (Signed)
[facility] calling in regarding the status on the refill rx.

## 2015-02-06 NOTE — Telephone Encounter (Signed)
Med sent under doc of the day  Dr Silverio Decamp   Patient was just seen by Dr Deatra Ina in August

## 2015-02-08 ENCOUNTER — Encounter (HOSPITAL_BASED_OUTPATIENT_CLINIC_OR_DEPARTMENT_OTHER): Payer: Medicare Other

## 2015-02-08 ENCOUNTER — Encounter (HOSPITAL_BASED_OUTPATIENT_CLINIC_OR_DEPARTMENT_OTHER): Payer: Medicare Other | Admitting: Oncology

## 2015-02-08 VITALS — BP 167/89 | HR 82 | Temp 97.8°F | Resp 18 | Wt 301.0 lb

## 2015-02-08 DIAGNOSIS — C773 Secondary and unspecified malignant neoplasm of axilla and upper limb lymph nodes: Secondary | ICD-10-CM

## 2015-02-08 DIAGNOSIS — C50412 Malignant neoplasm of upper-outer quadrant of left female breast: Secondary | ICD-10-CM

## 2015-02-08 DIAGNOSIS — Z5112 Encounter for antineoplastic immunotherapy: Secondary | ICD-10-CM | POA: Diagnosis present

## 2015-02-08 DIAGNOSIS — Z5111 Encounter for antineoplastic chemotherapy: Secondary | ICD-10-CM | POA: Diagnosis not present

## 2015-02-08 LAB — COMPREHENSIVE METABOLIC PANEL
ALBUMIN: 3.5 g/dL (ref 3.5–5.0)
ALK PHOS: 98 U/L (ref 38–126)
ALT: 22 U/L (ref 14–54)
ANION GAP: 6 (ref 5–15)
AST: 19 U/L (ref 15–41)
BILIRUBIN TOTAL: 0.5 mg/dL (ref 0.3–1.2)
BUN: 10 mg/dL (ref 6–20)
CALCIUM: 8.6 mg/dL — AB (ref 8.9–10.3)
CO2: 31 mmol/L (ref 22–32)
Chloride: 100 mmol/L — ABNORMAL LOW (ref 101–111)
Creatinine, Ser: 0.64 mg/dL (ref 0.44–1.00)
GLUCOSE: 137 mg/dL — AB (ref 65–99)
Potassium: 3.6 mmol/L (ref 3.5–5.1)
Sodium: 137 mmol/L (ref 135–145)
TOTAL PROTEIN: 7.6 g/dL (ref 6.5–8.1)

## 2015-02-08 LAB — CBC WITH DIFFERENTIAL/PLATELET
Basophils Absolute: 0 10*3/uL (ref 0.0–0.1)
Basophils Relative: 0 %
Eosinophils Absolute: 0.2 10*3/uL (ref 0.0–0.7)
Eosinophils Relative: 3 %
HEMATOCRIT: 34 % — AB (ref 36.0–46.0)
HEMOGLOBIN: 10.4 g/dL — AB (ref 12.0–15.0)
LYMPHS ABS: 1.9 10*3/uL (ref 0.7–4.0)
LYMPHS PCT: 31 %
MCH: 25.7 pg — AB (ref 26.0–34.0)
MCHC: 30.6 g/dL (ref 30.0–36.0)
MCV: 84 fL (ref 78.0–100.0)
MONO ABS: 0.2 10*3/uL (ref 0.1–1.0)
MONOS PCT: 3 %
NEUTROS ABS: 3.8 10*3/uL (ref 1.7–7.7)
NEUTROS PCT: 63 %
Platelets: 244 10*3/uL (ref 150–400)
RBC: 4.05 MIL/uL (ref 3.87–5.11)
RDW: 15.4 % (ref 11.5–15.5)
WBC: 6.1 10*3/uL (ref 4.0–10.5)

## 2015-02-08 MED ORDER — HEPARIN SOD (PORK) LOCK FLUSH 100 UNIT/ML IV SOLN
500.0000 [IU] | Freq: Once | INTRAVENOUS | Status: AC | PRN
  Administered 2015-02-08: 500 [IU]
  Filled 2015-02-08: qty 5

## 2015-02-08 MED ORDER — PALONOSETRON HCL INJECTION 0.25 MG/5ML
INTRAVENOUS | Status: AC
  Filled 2015-02-08: qty 5

## 2015-02-08 MED ORDER — TRASTUZUMAB CHEMO INJECTION 440 MG
2.0000 mg/kg | Freq: Once | INTRAVENOUS | Status: AC
  Administered 2015-02-08: 273 mg via INTRAVENOUS
  Filled 2015-02-08: qty 13

## 2015-02-08 MED ORDER — ACETAMINOPHEN 325 MG PO TABS
650.0000 mg | ORAL_TABLET | Freq: Once | ORAL | Status: AC
  Administered 2015-02-08: 650 mg via ORAL

## 2015-02-08 MED ORDER — PACLITAXEL PROTEIN-BOUND CHEMO INJECTION 100 MG
100.0000 mg/m2 | Freq: Once | INTRAVENOUS | Status: AC
  Administered 2015-02-08: 250 mg via INTRAVENOUS
  Filled 2015-02-08: qty 50

## 2015-02-08 MED ORDER — ACETAMINOPHEN 325 MG PO TABS
ORAL_TABLET | ORAL | Status: AC
  Filled 2015-02-08: qty 2

## 2015-02-08 MED ORDER — DIPHENHYDRAMINE HCL 25 MG PO CAPS
ORAL_CAPSULE | ORAL | Status: AC
  Filled 2015-02-08: qty 2

## 2015-02-08 MED ORDER — SODIUM CHLORIDE 0.9 % IV SOLN
Freq: Once | INTRAVENOUS | Status: AC
  Administered 2015-02-08: 10:00:00 via INTRAVENOUS

## 2015-02-08 MED ORDER — PALONOSETRON HCL INJECTION 0.25 MG/5ML
0.2500 mg | Freq: Once | INTRAVENOUS | Status: AC
Start: 2015-02-08 — End: 2015-02-08
  Administered 2015-02-08: 0.25 mg via INTRAVENOUS

## 2015-02-08 MED ORDER — DIPHENHYDRAMINE HCL 25 MG PO CAPS
50.0000 mg | ORAL_CAPSULE | Freq: Once | ORAL | Status: AC
  Administered 2015-02-08: 50 mg via ORAL

## 2015-02-08 MED ORDER — SODIUM CHLORIDE 0.9 % IJ SOLN: 10.0000 mL | INTRAMUSCULAR | Status: DC | PRN

## 2015-02-08 NOTE — Progress Notes (Signed)
Kristy Mustache, MD [place] [address]  Breast cancer of upper-outer quadrant of left female breast [facility])  CURRENT THERAPY: Adjuvant Abraxane/Herceptin  INTERVAL HISTORY: Kristy Harris 69 y.o. 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    Breast cancer of upper-outer quadrant of left female breast ([name])   11/01/2014 Mammogram Possible mass in the left breast upper outer quadrant measuring 13 mm suspicious for breast cancer confirmed through ultrasound a spiculated hypoechoic mass ill-defined, no enlarged lymph nodes   11/01/2014 Initial Diagnosis Invasive ductal carcinoma, moderately differentiated, ER > 90%, PR> 90%, HER-2 -2+ by IHC, ratio 1.15, KI 67: 29%, T1 cN0 stage IA clinical stage   12/25/2014 Surgery Left lumpectomy: IDC 1.7 cm, positive for LVI, with DCIS, 1/1 sentinel node positive deposit 1.9 cm with extracapsular extension, ER 90%, PR 90%, HER-2 positive ratio 2.4, Ki-67 29% T1 cN1 stage II a   02/01/2015 -  Chemotherapy Abraxane/Herceptin    I personally reviewed and went over laboratory results with the patient.  The results are noted within this dictation.  She tolerated treatment well.  She notes a 30 min episode of chills following treatment in the clinic.  This resolved witout difficulty.  Her PN is at baseline.  Her breathing is at baseline.  She notes an appetite of 75% but no documented weight loss in [facility].  She still weighs 301 lbs.  Past Medical History  Diagnosis Date  . Diabetes mellitus   . Morbid obesity ([place])   . Recurrent boils     of perineal and buttocks  . Hypertension   . Hyperlipidemia   . COPD (chronic obstructive pulmonary disease) ([place])   . Enlarged heart   . Anemia   . Arthritis   . Heart murmur   . Urinary frequency   . Sleep apnea     no cpap used since weight loss  . H. pylori  infection   . Diverticulosis april 2016  . Hidradenitis suppurativa   . PONV (postoperative nausea and vomiting)   . Aortic stenosis     mild AS 11/24/14 echo  . Shortness of breath dyspnea     with exertion  . Pneumonia years ago  . GERD (gastroesophageal reflux disease)   . Breast cancer ([place]) 11/01/14    left breast     has Dyspnea; Hypertension; Hyperlipidemia; Obesity; Bruit; Murmur; Abnormal cardiovascular function study; Peripheral vascular disease ([place]); Diabetes mellitus ([place]); Arteritis ([place]); Abscess of buttock, right; Abscess of left thigh; Abscess of left axilla; Iron deficiency anemia; Abscess of skin and subcutaneous tissue; H. pylori infection; Diverticulosis of colon with hemorrhage; Breast cancer of upper-outer quadrant of left female breast ([place]); Encounter for pre-operative cardiovascular clearance; Aortic stenosis; Normal coronary arteries; Morbid obesity ([place]); DJD (degenerative joint disease); Family history of breast cancer; and Genetic testing on her problem list.     is allergic to lisinopril and penicillins.  Current Outpatient Prescriptions on File Prior to Visit  Medication Sig Dispense Refill  . acetaminophen (TYLENOL) 500 MG tablet Take 1,000 mg by mouth every 6 (six) hours as needed for mild pain or headache.    . albuterol (PROVENTIL HFA;VENTOLIN HFA) 108 (90 BASE) MCG/ACT inhaler Inhale 2 puffs into the lungs every 4 (four) hours as needed for shortness of breath.    Marland Kitchen atorvastatin (LIPITOR) 20 MG tablet Take 20 mg by mouth every  morning.     . bismuth subsalicylate (PEPTO BISMOL) 262 MG chewable tablet Chew 2 tablets (524 mg total) by mouth 2 (two) times daily. (Patient taking differently: Chew 524 mg by mouth 2 (two) times daily as needed for indigestion (acid reflux). ) 22 tablet 0  . cephALEXin (KEFLEX) 500 MG capsule Take 1 capsule (500 mg total) by mouth 4 (four) times daily. 40 capsule 0  . Cholecalciferol (VITAMIN D-3) 5000 UNITS TABS Take 5,000 Units  by mouth every morning.    . ferrous sulfate 325 (65 FE) MG tablet Take 1 tablet (325 mg total) by mouth every morning. (Patient taking differently: Take 325 mg by mouth 3 (three) times daily with meals. ) 30 tablet 0  . fluconazole (DIFLUCAN) 100 MG tablet TAKE ONE (1) TABLET EACH DAY as needed for yeast infection caused by Bactrim.    . furosemide (LASIX) 20 MG tablet TAKE ONE TABLET BY MOUTH ONCE DAILY.    Marland Kitchen glipiZIDE (GLUCOTROL XL) 10 MG 24 hr tablet Take 10 mg by mouth daily with breakfast.     . HYDROcodone-acetaminophen (NORCO/VICODIN) 5-325 MG per tablet Take 1 tablet by mouth 2 (two) times daily as needed for moderate pain.     Marland Kitchen ketoconazole (NIZORAL) 2 % cream APPLY TOPICALLY DAILY. USE EXTERNALLY FOR VAGINAL ITCHING DAILY AS NEEDED  0  . lidocaine-prilocaine (EMLA) cream Apply a quarter size amount to port site 1 hour prior to chemo. Do not rub in. Cover with plastic wrap. 30 g 3  . losartan (COZAAR) 100 MG tablet Take 100 mg by mouth daily.    . metFORMIN (GLUCOPHAGE-XR) 500 MG 24 hr tablet Take 500 mg by mouth 2 (two) times daily.     . metoprolol (TOPROL-XL) 100 MG 24 hr tablet Take 100 mg by mouth every morning.     . ondansetron (ZOFRAN) 8 MG tablet Take 1 tablet (8 mg total) by mouth every 8 (eight) hours as needed for nausea or vomiting. 30 tablet 2  . PACLitaxel Protein-Bound Part (ABRAXANE IV) Inject into the vein. To be given weekly    . pantoprazole (PROTONIX) 40 MG tablet TAKE 1 TABLET (40 MG TOTAL) BY MOUTH DAILY. 90 tablet 0  . polyethylene glycol (MIRALAX / GLYCOLAX) packet Take 17 g by mouth daily as needed for mild constipation. 14 each 0  . prochlorperazine (COMPAZINE) 10 MG tablet Take 1 tablet (10 mg total) by mouth every 6 (six) hours as needed for nausea or vomiting. 30 tablet 2  . sulfamethoxazole-trimethoprim (BACTRIM,SEPTRA) 400-80 MG per tablet TAKE ONE TABLET BY MOUTH ONCE DAILY.    . Trastuzumab (HERCEPTIN IV) Inject into the vein. To be given weekly    .  verapamil (CALAN-SR) 120 MG CR tablet Take 120 mg by mouth daily.     Current Facility-Administered Medications on File Prior to Visit  Medication Dose Route Frequency Provider Last Rate Last Dose  . heparin lock flush 100 unit/mL  500 Units Intracatheter Once PRN Patrici Ranks, MD      . PACLitaxel-protein bound (ABRAXANE) chemo infusion 250 mg  100 mg/m2 (Treatment Plan Actual) Intravenous Once Patrici Ranks, MD      . sodium chloride 0.9 % injection 10 mL  10 mL Intracatheter PRN Patrici Ranks, MD      . trastuzumab (HERCEPTIN) 273 mg in sodium chloride 0.9 % 250 mL chemo infusion  2 mg/kg (Treatment Plan Actual) Intravenous Once Patrici Ranks, MD        Past  Surgical History  Procedure Laterality Date  . Pilonidal cyst excision    . Bladder suspension      x 2  . Abcess drainage      on bottom  . Cardiac catheterization  02/21/2011    Normal coronary arteries, normal EF  . Tonsillectomy  yrs ago  . Irrigation and debridement abscess Right 12/23/2012    Procedure: incision  AND DEBRIDEMENT right buttock infection ;  Surgeon: Kandis Cocking, MD;  Location: [facility];  Service: General;  Laterality: Right;  . Hydradenitis excision Left 12/08/2013    Procedure: EXCISION HIDRADENITIS AXILLA;  Surgeon: [name], MD;  Location: [facility];  Service: General;  Laterality: Left;  . Excision hydradenitis labia N/A 12/08/2013    Procedure: EXCISION HIDRADENITIS PUBIC AREA;  Surgeon: [name], MD;  Location: [facility];  Service: General;  Laterality: N/A;  . Breast lumpectomy with radioactive seed and sentinel lymph node biopsy Left 12/25/2014    Procedure: RADIOACTIVE SEED GIUDED LEFT BREAST LUMPECTOMY, LEFT AXILLARY SENTINEL LYMPH NODE BIOPSY;  Surgeon: [name], MD;  Location: [facility];  Service: General;  Laterality: Left;  . Abdominal hysterectomy  25 yrs ago    partial  . Portacath placement N/A 01/12/2015    Procedure: INSERTION PORT-A-CATH;  Surgeon: [name], MD;   Location: [facility];  Service: General;  Laterality: N/A;    Denies any headaches, dizziness, double vision, fevers, chills, night sweats, nausea, vomiting, diarrhea, constipation, chest pain, heart palpitations, shortness of breath, blood in stool, black tarry stool, urinary pain, urinary burning, urinary frequency, hematuria.   PHYSICAL EXAMINATION  ECOG PERFORMANCE STATUS: 2 - Symptomatic, <50% confined to bed  There were no vitals filed for this visit.  GENERAL:alert, no distress, comfortable, cooperative, obese, smiling and in chemo-bed, accompanied by her two daughters with interesting family dynamics. SKIN: skin color, texture, turgor are normal, no rashes or significant lesions HEAD: Normocephalic, No masses, lesions, tenderness or abnormalities EYES: normal, PERRLA, EOMI, Conjunctiva are pink and non-injected EARS: External ears normal OROPHARYNX:lips, buccal mucosa, and tongue normal and mucous membranes are moist  NECK: supple, trachea midline LYMPH:  no palpable lymphadenopathy BREAST:not examined LUNGS: clear to auscultation, anteriorly HEART: regular rate & rhythm ABDOMEN:abdomen soft, non-tender, obese and normal bowel sounds BACK: Back symmetric, no curvature., No CVA tenderness EXTREMITIES:less then 2 second capillary refill, no joint deformities, effusion, or inflammation, no skin discoloration, no cyanosis, positive findings:  edema 1+ pitting edema in B/L LE.  NEURO: alert & oriented x 3 with fluent speech, no focal motor/sensory deficits   LABORATORY DATA: CBC    Component Value Date/Time   WBC 6.1 02/08/2015 0935   RBC 4.05 02/08/2015 0935   HGB 10.4* 02/08/2015 0935   HCT 34.0* 02/08/2015 0935   PLT 244 02/08/2015 0935   MCV 84.0 02/08/2015 0935   MCH 25.7* 02/08/2015 0935   MCHC 30.6 02/08/2015 0935   RDW 15.4 02/08/2015 0935   LYMPHSABS 1.9 02/08/2015 0935   MONOABS 0.2 02/08/2015 0935   EOSABS 0.2 02/08/2015 0935   BASOSABS 0.0 02/08/2015 0935       Chemistry      Component Value Date/Time   NA 137 02/08/2015 0935   K 3.6 02/08/2015 0935   CL 100* 02/08/2015 0935   CO2 31 02/08/2015 0935   BUN 10 02/08/2015 0935   CREATININE 0.64 02/08/2015 0935      Component Value Date/Time   CALCIUM 8.6* 02/08/2015 0935   ALKPHOS 98 02/08/2015 0935  AST 19 02/08/2015 0935   ALT 22 02/08/2015 0935   BILITOT 0.5 02/08/2015 0935        PENDING LABS:   RADIOGRAPHIC STUDIES:  Dg Chest Port 1 View  01/12/2015  CLINICAL DATA:  Status post port placement EXAM: PORTABLE CHEST - 1 VIEW COMPARISON:  12/08/2013 FINDINGS: Cardiac shadow remains enlarged. A new right-sided chest wall port is noted with the catheter tip at the cavoatrial junction. Downward depression of the catheter is noted at the first costoclavicular space. Mild atelectasis is noted bilaterally due to a poor inspiratory effort. Some vascular crowding is noted as well. No pneumothorax is seen. IMPRESSION: No evidence of pneumothorax. Poor inspiratory effort with bilateral atelectatic changes. Downward depression of the catheter as described. This may predispose the catheter to potential pinch off syndrome. Electronically Signed   By: Inez Catalina M.D.   On: 01/12/2015 11:12   Dg C-arm 1-60 Min-no Report  01/12/2015  CLINICAL DATA: port cath C-ARM 1-60 MINUTES Fluoroscopy was utilized by the requesting physician.  No radiographic interpretation.     PATHOLOGY:    ASSESSMENT AND PLAN:  Breast cancer of upper-outer quadrant of left female breast ([place]) Stage IIA (T1N1c) invasive ductal carcinoma of left breast, ER+/PR+/HER2+, with 1/1 sentinel lymph node for metastatic disease.  Oncology history is updated.  Staging completed in [facility] problem list.  Pre-chemo labs today: CBC diff, CMET.  Labs meet treatment parameters today.  HGB decrease is noted.  No intervention needed at this time for a HGB of 10.4 g/dL.  Will need to continue monitor HGB.  Since she is being treated with  curative intent, she is not a candidate for ESA therapy and will need PRBC transfusions.  I would recommend keeping her HGB around 9 g/dL due to her heart history.  Return in 1 week for follow-up and treatment.  THERAPY PLAN:  Continue with weekly treatment as planned.  All questions were answered. The patient knows to call the clinic with any problems, questions or concerns. We can certainly see the patient much sooner if necessary.  Patient and plan discussed with Dr. Ancil Linsey and she is in agreement with the aforementioned.   This note is electronically signed by: Doy Mince 02/08/2015 10:18 AM

## 2015-02-08 NOTE — Patient Instructions (Signed)
[facility] Discharge Instructions for Patients Receiving Chemotherapy  Today you received the following chemotherapy agents: Herceptin and Abraxane.   If you develop nausea and vomiting, or diarrhea that is not controlled by your medication, call the clinic.  The clinic phone number is [phone]. Office hours are Monday-Friday 8:30am-5:00pm.  BELOW ARE SYMPTOMS THAT SHOULD BE REPORTED IMMEDIATELY:  *FEVER GREATER THAN 101.0 F  *CHILLS WITH OR WITHOUT FEVER  NAUSEA AND VOMITING THAT IS NOT CONTROLLED WITH YOUR NAUSEA MEDICATION  *UNUSUAL SHORTNESS OF BREATH  *UNUSUAL BRUISING OR BLEEDING  TENDERNESS IN MOUTH AND THROAT WITH OR WITHOUT PRESENCE OF ULCERS  *URINARY PROBLEMS  *BOWEL PROBLEMS  UNUSUAL RASH Items with * indicate a potential emergency and should be followed up as soon as possible. If you have an emergency after office hours please contact your primary care physician or go to the nearest emergency department.  Please call the clinic during office hours if you have any questions or concerns.   You may also contact the Patient Navigator at [phone] should you have any questions or need assistance in obtaining follow up care.

## 2015-02-08 NOTE — Patient Instructions (Signed)
[place] at [facility] Discharge Instructions  RECOMMENDATIONS MADE BY THE CONSULTANT AND ANY TEST RESULTS WILL BE SENT TO YOUR REFERRING PHYSICIAN.  Exam and discussion by Robynn Pane, PA-C Will treat today Report fevers, uncontrolled nausea, vomiting, rashes, etc Take anti-nausea medication as needed.  Follow-up in 1 week with labs, office visit and possible.chemotherapy.  Thank you for choosing [place] at [facility] to provide your oncology and hematology care.  To afford each patient quality time with our provider, please arrive at least 15 minutes before your scheduled appointment time.    You need to re-schedule your appointment should you arrive 10 or more minutes late.  We strive to give you quality time with our providers, and arriving late affects you and other patients whose appointments are after yours.  Also, if you no show three or more times for appointments you may be dismissed from the clinic at the providers discretion.     Again, thank you for choosing [facility].  Our hope is that these requests will decrease the amount of time that you wait before being seen by our physicians.       _____________________________________________________________  Should you have questions after your visit to [facility], please contact our office at [phone] between the hours of 8:30 a.m. and 4:30 p.m.  Voicemails left after 4:30 p.m. will not be returned until the following business day.  For prescription refill requests, have your pharmacy contact our office.

## 2015-02-08 NOTE — Progress Notes (Signed)
Patient tolerated infusion well.  VSS post infusion.   

## 2015-02-08 NOTE — Assessment & Plan Note (Addendum)
Stage IIA (T1N1c) invasive ductal carcinoma of left breast, ER+/PR+/HER2+, with 1/1 sentinel lymph node for metastatic disease.  Oncology history is updated.  Staging completed in [facility] problem list.  Pre-chemo labs today: CBC diff, CMET.  Labs meet treatment parameters today.  HGB decrease is noted.  No intervention needed at this time for a HGB of 10.4 g/dL.  Will need to continue monitor HGB.  Since she is being treated with curative intent, she is not a candidate for ESA therapy and will need PRBC transfusions.  I would recommend keeping her HGB around 9 g/dL due to her heart history.  Return in 1 week for follow-up and treatment.

## 2015-02-15 ENCOUNTER — Encounter (HOSPITAL_COMMUNITY): Payer: Self-pay | Admitting: Oncology

## 2015-02-15 ENCOUNTER — Encounter (HOSPITAL_BASED_OUTPATIENT_CLINIC_OR_DEPARTMENT_OTHER): Payer: Medicare Other | Admitting: Oncology

## 2015-02-15 ENCOUNTER — Encounter (HOSPITAL_BASED_OUTPATIENT_CLINIC_OR_DEPARTMENT_OTHER): Payer: Medicare Other

## 2015-02-15 VITALS — BP 141/68 | HR 76 | Temp 98.7°F | Resp 20

## 2015-02-15 VITALS — BP 168/81 | HR 85 | Temp 97.9°F | Resp 20 | Wt 296.9 lb

## 2015-02-15 DIAGNOSIS — C50412 Malignant neoplasm of upper-outer quadrant of left female breast: Secondary | ICD-10-CM

## 2015-02-15 DIAGNOSIS — C773 Secondary and unspecified malignant neoplasm of axilla and upper limb lymph nodes: Secondary | ICD-10-CM

## 2015-02-15 DIAGNOSIS — Z5112 Encounter for antineoplastic immunotherapy: Secondary | ICD-10-CM

## 2015-02-15 DIAGNOSIS — Z5111 Encounter for antineoplastic chemotherapy: Secondary | ICD-10-CM | POA: Diagnosis not present

## 2015-02-15 LAB — CBC WITH DIFFERENTIAL/PLATELET
BASOS PCT: 0 %
Basophils Absolute: 0 10*3/uL (ref 0.0–0.1)
EOS ABS: 0.1 10*3/uL (ref 0.0–0.7)
EOS PCT: 3 %
HCT: 35.5 % — ABNORMAL LOW (ref 36.0–46.0)
Hemoglobin: 10.8 g/dL — ABNORMAL LOW (ref 12.0–15.0)
LYMPHS ABS: 2.4 10*3/uL (ref 0.7–4.0)
Lymphocytes Relative: 45 %
MCH: 25.9 pg — AB (ref 26.0–34.0)
MCHC: 30.4 g/dL (ref 30.0–36.0)
MCV: 85.1 fL (ref 78.0–100.0)
Monocytes Absolute: 0.3 10*3/uL (ref 0.1–1.0)
Monocytes Relative: 6 %
NEUTROS PCT: 46 %
Neutro Abs: 2.4 10*3/uL (ref 1.7–7.7)
PLATELETS: 291 10*3/uL (ref 150–400)
RBC: 4.17 MIL/uL (ref 3.87–5.11)
RDW: 16.2 % — ABNORMAL HIGH (ref 11.5–15.5)
WBC: 5.2 10*3/uL (ref 4.0–10.5)

## 2015-02-15 LAB — COMPREHENSIVE METABOLIC PANEL
ALBUMIN: 3.6 g/dL (ref 3.5–5.0)
ALT: 21 U/L (ref 14–54)
ANION GAP: 8 (ref 5–15)
AST: 20 U/L (ref 15–41)
Alkaline Phosphatase: 81 U/L (ref 38–126)
BUN: 11 mg/dL (ref 6–20)
CHLORIDE: 99 mmol/L — AB (ref 101–111)
CO2: 31 mmol/L (ref 22–32)
Calcium: 8.9 mg/dL (ref 8.9–10.3)
Creatinine, Ser: 0.93 mg/dL (ref 0.44–1.00)
GFR calc non Af Amer: >60 mL/min (ref >60)
Glucose, Bld: 135 mg/dL — ABNORMAL HIGH (ref 65–99)
Potassium: 3.7 mmol/L (ref 3.5–5.1)
SODIUM: 138 mmol/L (ref 135–145)
Total Bilirubin: 0.5 mg/dL (ref 0.3–1.2)
Total Protein: 7.6 g/dL (ref 6.5–8.1)

## 2015-02-15 MED ORDER — DIPHENHYDRAMINE HCL 25 MG PO CAPS
50.0000 mg | ORAL_CAPSULE | Freq: Once | ORAL | Status: AC
  Administered 2015-02-15: 50 mg via ORAL
  Filled 2015-02-15: qty 2

## 2015-02-15 MED ORDER — ACETAMINOPHEN 325 MG PO TABS
650.0000 mg | ORAL_TABLET | Freq: Once | ORAL | Status: AC
  Administered 2015-02-15: 650 mg via ORAL
  Filled 2015-02-15: qty 2

## 2015-02-15 MED ORDER — TRASTUZUMAB CHEMO INJECTION 440 MG
2.0000 mg/kg | Freq: Once | INTRAVENOUS | Status: AC
  Administered 2015-02-15: 273 mg via INTRAVENOUS
  Filled 2015-02-15: qty 13

## 2015-02-15 MED ORDER — SODIUM CHLORIDE 0.9 % IV SOLN
Freq: Once | INTRAVENOUS | Status: AC
  Administered 2015-02-15: 12:00:00 via INTRAVENOUS

## 2015-02-15 MED ORDER — HEPARIN SOD (PORK) LOCK FLUSH 100 UNIT/ML IV SOLN
500.0000 [IU] | Freq: Once | INTRAVENOUS | Status: AC | PRN
  Administered 2015-02-15: 500 [IU]

## 2015-02-15 MED ORDER — DIPHENHYDRAMINE HCL 25 MG PO CAPS
ORAL_CAPSULE | ORAL | Status: AC
  Filled 2015-02-15: qty 1

## 2015-02-15 MED ORDER — PACLITAXEL PROTEIN-BOUND CHEMO INJECTION 100 MG
100.0000 mg/m2 | Freq: Once | INTRAVENOUS | Status: AC
  Administered 2015-02-15: 250 mg via INTRAVENOUS
  Filled 2015-02-15: qty 50

## 2015-02-15 MED ORDER — SODIUM CHLORIDE 0.9 % IJ SOLN
10.0000 mL | INTRAMUSCULAR | Status: DC | PRN
  Administered 2015-02-15: 10 mL
  Filled 2015-02-15: qty 10

## 2015-02-15 MED ORDER — HEPARIN SOD (PORK) LOCK FLUSH 100 UNIT/ML IV SOLN
INTRAVENOUS | Status: AC
  Filled 2015-02-15: qty 5

## 2015-02-15 MED ORDER — PALONOSETRON HCL INJECTION 0.25 MG/5ML
0.2500 mg | Freq: Once | INTRAVENOUS | Status: AC
  Administered 2015-02-15: 0.25 mg via INTRAVENOUS
  Filled 2015-02-15: qty 5

## 2015-02-15 NOTE — Assessment & Plan Note (Addendum)
Stage IIA (T1N1c) invasive ductal carcinoma of left breast, ER+/PR+/HER2+, with 1/1 sentinel lymph node for metastatic disease.  Oncology history is up-to-date.  She notes a 1-2 day history of increased loose stools, about 5/24 hours.  She notes that it resolved 2 days ago.  She notes that at baseline she has 2-3 per day.  I have recommended [place] spray to her nose for infrequent dried blood in nares.  She may also place Vasoline to her nares to maintain moist mucous membranes.  Pre-chemo labs today: CBC diff, CMET.  Labs meet treatment parameters today.  HGB is stable.  Platelets are WNL.  Return as scheduled in 1 week for follow-up and treatment.

## 2015-02-15 NOTE — Progress Notes (Signed)
Tolerated chemo without problems 

## 2015-02-15 NOTE — Progress Notes (Signed)
Kristy Mustache, MD [place] [address]  Breast cancer of upper-outer quadrant of left female breast [facility])  CURRENT THERAPY: Adjuvant Abraxane/Herceptin  INTERVAL HISTORY: [name] 69 y.o. 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    Breast cancer of upper-outer quadrant of left female breast ([place])   11/01/2014 Mammogram Possible mass in the left breast upper outer quadrant measuring 13 mm suspicious for breast cancer confirmed through ultrasound a spiculated hypoechoic mass ill-defined, no enlarged lymph nodes   11/01/2014 Initial Diagnosis Invasive ductal carcinoma, moderately differentiated, ER > 90%, PR> 90%, HER-2 -2+ by IHC, ratio 1.15, KI 67: 29%, T1 cN0 stage IA clinical stage   12/25/2014 Surgery Left lumpectomy: IDC 1.7 cm, positive for LVI, with DCIS, 1/1 sentinel node positive deposit 1.9 cm with extracapsular extension, ER 90%, PR 90%, HER-2 positive ratio 2.4, Ki-67 29% T1 cN1 stage II a   02/01/2015 -  Chemotherapy Abraxane/Herceptin    I personally reviewed and went over laboratory results with the patient.  The results are noted within this dictation.  Her vitals are reviewed.  Her weight is down 3-4 lbs.  However, she was 301 lbs.  This minimal weight loss is not of concern at this time.  This is only a 1.3% weight loss.  This is not significant at this time.  We will continue to monitor.  She notes a short course of increased loose stools, 5 per day.  She notes that her baseline is 2-3 BM/day.  She notes that it resolved 2 days ago.  Otherwise, she notes some dried blood in her nose infrequently.  She otherwise denies any complaints and reports good tolerability of her last treatment cycle.   Past Medical History  Diagnosis Date  . Diabetes mellitus   . Morbid obesity ([place])   . Recurrent boils     of  perineal and buttocks  . Hypertension   . Hyperlipidemia   . COPD (chronic obstructive pulmonary disease) ([place])   . Enlarged heart   . Anemia   . Arthritis   . Heart murmur   . Urinary frequency   . Sleep apnea     no cpap used since weight loss  . H. pylori infection   . Diverticulosis april 2016  . Hidradenitis suppurativa   . PONV (postoperative nausea and vomiting)   . Aortic stenosis     mild AS 11/24/14 echo  . Shortness of breath dyspnea     with exertion  . Pneumonia years ago  . GERD (gastroesophageal reflux disease)   . Breast cancer ([place]) 11/01/14    left breast     has Dyspnea; Hypertension; Hyperlipidemia; Obesity; Bruit; Murmur; Abnormal cardiovascular function study; Peripheral vascular disease ([place]); Diabetes mellitus ([place]); Arteritis ([place]); Abscess of buttock, right; Abscess of left thigh; Abscess of left axilla; Iron deficiency anemia; Abscess of skin and subcutaneous tissue; H. pylori infection; Diverticulosis of colon with hemorrhage; Breast cancer of upper-outer quadrant of left female breast ([place]); Encounter for pre-operative cardiovascular clearance; Aortic stenosis; Normal coronary arteries; Morbid obesity ([place]); DJD (degenerative joint disease); Family history of breast cancer; and Genetic testing on her problem list.     is allergic to lisinopril and penicillins.  Current Outpatient Prescriptions on File Prior to Visit  Medication Sig Dispense Refill  . acetaminophen (TYLENOL) 500 MG tablet Take 1,000 mg by mouth every  6 (six) hours as needed for mild pain or headache.    . albuterol (PROVENTIL HFA;VENTOLIN HFA) 108 (90 BASE) MCG/ACT inhaler Inhale 2 puffs into the lungs every 4 (four) hours as needed for shortness of breath.    Marland Kitchen atorvastatin (LIPITOR) 20 MG tablet Take 20 mg by mouth every morning.     . bismuth subsalicylate (PEPTO BISMOL) 262 MG chewable tablet Chew 2 tablets (524 mg total) by mouth 2 (two) times daily. (Patient taking differently: Chew  524 mg by mouth 2 (two) times daily as needed for indigestion (acid reflux). ) 22 tablet 0  . cephALEXin (KEFLEX) 500 MG capsule Take 1 capsule (500 mg total) by mouth 4 (four) times daily. 40 capsule 0  . Cholecalciferol (VITAMIN D-3) 5000 UNITS TABS Take 5,000 Units by mouth every morning.    . ferrous sulfate 325 (65 FE) MG tablet Take 1 tablet (325 mg total) by mouth every morning. (Patient taking differently: Take 325 mg by mouth 3 (three) times daily with meals. ) 30 tablet 0  . fluconazole (DIFLUCAN) 100 MG tablet TAKE ONE (1) TABLET EACH DAY as needed for yeast infection caused by Bactrim.    . furosemide (LASIX) 20 MG tablet TAKE ONE TABLET BY MOUTH ONCE DAILY.    Marland Kitchen glipiZIDE (GLUCOTROL XL) 10 MG 24 hr tablet Take 10 mg by mouth daily with breakfast.     . HYDROcodone-acetaminophen (NORCO/VICODIN) 5-325 MG per tablet Take 1 tablet by mouth 2 (two) times daily as needed for moderate pain.     Marland Kitchen ketoconazole (NIZORAL) 2 % cream APPLY TOPICALLY DAILY. USE EXTERNALLY FOR VAGINAL ITCHING DAILY AS NEEDED  0  . lidocaine-prilocaine (EMLA) cream Apply a quarter size amount to port site 1 hour prior to chemo. Do not rub in. Cover with plastic wrap. 30 g 3  . losartan (COZAAR) 100 MG tablet Take 100 mg by mouth daily.    . metFORMIN (GLUCOPHAGE-XR) 500 MG 24 hr tablet Take 500 mg by mouth 2 (two) times daily.     . metoprolol (TOPROL-XL) 100 MG 24 hr tablet Take 100 mg by mouth every morning.     . ondansetron (ZOFRAN) 8 MG tablet Take 1 tablet (8 mg total) by mouth every 8 (eight) hours as needed for nausea or vomiting. 30 tablet 2  . PACLitaxel Protein-Bound Part (ABRAXANE IV) Inject into the vein. To be given weekly    . pantoprazole (PROTONIX) 40 MG tablet TAKE 1 TABLET (40 MG TOTAL) BY MOUTH DAILY. 90 tablet 0  . polyethylene glycol (MIRALAX / GLYCOLAX) packet Take 17 g by mouth daily as needed for mild constipation. 14 each 0  . prochlorperazine (COMPAZINE) 10 MG tablet Take 1 tablet (10 mg  total) by mouth every 6 (six) hours as needed for nausea or vomiting. 30 tablet 2  . sulfamethoxazole-trimethoprim (BACTRIM,SEPTRA) 400-80 MG per tablet TAKE ONE TABLET BY MOUTH ONCE DAILY.    . Trastuzumab (HERCEPTIN IV) Inject into the vein. To be given weekly    . verapamil (CALAN-SR) 120 MG CR tablet Take 120 mg by mouth daily.     Current Facility-Administered Medications on File Prior to Visit  Medication Dose Route Frequency Provider Last Rate Last Dose  . 0.9 %  sodium chloride infusion   Intravenous Once Patrici Ranks, MD      . acetaminophen (TYLENOL) tablet 650 mg  650 mg Oral Once Patrici Ranks, MD      . diphenhydrAMINE (BENADRYL) capsule 50 mg  50  mg Oral Once Patrici Ranks, MD      . palonosetron (ALOXI) injection 0.25 mg  0.25 mg Intravenous Once Patrici Ranks, MD      . sodium chloride 0.9 % injection 10 mL  10 mL Intracatheter PRN Patrici Ranks, MD        Past Surgical History  Procedure Laterality Date  . Pilonidal cyst excision    . Bladder suspension      x 2  . Abcess drainage      on bottom  . Cardiac catheterization  02/21/2011    Normal coronary arteries, normal EF  . Tonsillectomy  yrs ago  . Irrigation and debridement abscess Right 12/23/2012    Procedure: incision  AND DEBRIDEMENT right buttock infection ;  Surgeon: Shann Medal, MD;  Location: [facility];  Service: General;  Laterality: Right;  . Hydradenitis excision Left 12/08/2013    Procedure: EXCISION HIDRADENITIS AXILLA;  Surgeon: Alphonsa Overall, MD;  Location: [facility];  Service: General;  Laterality: Left;  . Excision hydradenitis labia N/A 12/08/2013    Procedure: EXCISION HIDRADENITIS PUBIC AREA;  Surgeon: Alphonsa Overall, MD;  Location: [facility];  Service: General;  Laterality: N/A;  . Breast lumpectomy with radioactive seed and sentinel lymph node biopsy Left 12/25/2014    Procedure: RADIOACTIVE SEED GIUDED LEFT BREAST LUMPECTOMY, LEFT AXILLARY SENTINEL LYMPH NODE BIOPSY;  Surgeon: Alphonsa Overall, MD;  Location: [place];  Service: General;  Laterality: Left;  . Abdominal hysterectomy  25 yrs ago    partial  . Portacath placement N/A 01/12/2015    Procedure: INSERTION PORT-A-CATH;  Surgeon: Alphonsa Overall, MD;  Location: [facility];  Service: General;  Laterality: N/A;    Denies any headaches, dizziness, double vision, fevers, chills, night sweats, nausea, vomiting, diarrhea, constipation, chest pain, heart palpitations, shortness of breath, blood in stool, black tarry stool, urinary pain, urinary burning, urinary frequency, hematuria.   PHYSICAL EXAMINATION  ECOG PERFORMANCE STATUS: 2 - Symptomatic, <50% confined to bed  Filed Vitals:   02/15/15 1045  BP: 168/81  Pulse: 85  Temp: 97.9 F (36.6 C)  Resp: 20    GENERAL:alert, no distress, comfortable, cooperative, obese, smiling and in chemo-bed, accompanied by her daughter. SKIN: skin color, texture, turgor are normal, no rashes or significant lesions HEAD: Normocephalic, No masses, lesions, tenderness or abnormalities EYES: normal, PERRLA, EOMI, Conjunctiva are pink and non-injected EARS: External ears normal OROPHARYNX:lips, buccal mucosa, and tongue normal and mucous membranes are moist  NECK: supple, trachea midline LYMPH:  no palpable lymphadenopathy BREAST:not examined LUNGS: clear to auscultation, anteriorly HEART: regular rate & rhythm ABDOMEN:abdomen soft, non-tender, obese and normal bowel sounds BACK: Back symmetric, no curvature., No CVA tenderness EXTREMITIES:less then 2 second capillary refill, no joint deformities, effusion, or inflammation, no skin discoloration, no cyanosis, positive findings:  edema 1+ pitting edema in B/L LE.  NEURO: alert & oriented x 3 with fluent speech, no focal motor/sensory deficits   LABORATORY DATA: CBC    Component Value Date/Time   WBC 5.2 02/15/2015 1055   RBC 4.17 02/15/2015 1055   HGB 10.8* 02/15/2015 1055   HCT 35.5* 02/15/2015 1055   PLT 291 02/15/2015 1055   MCV  85.1 02/15/2015 1055   MCH 25.9* 02/15/2015 1055   MCHC 30.4 02/15/2015 1055   RDW 16.2* 02/15/2015 1055   LYMPHSABS 2.4 02/15/2015 1055   MONOABS 0.3 02/15/2015 1055   EOSABS 0.1 02/15/2015 1055   BASOSABS 0.0 02/15/2015 1055  Chemistry      Component Value Date/Time   NA 138 02/15/2015 1055   K 3.7 02/15/2015 1055   CL 99* 02/15/2015 1055   CO2 31 02/15/2015 1055   BUN 11 02/15/2015 1055   CREATININE 0.93 02/15/2015 1055      Component Value Date/Time   CALCIUM 8.9 02/15/2015 1055   ALKPHOS 81 02/15/2015 1055   AST 20 02/15/2015 1055   ALT 21 02/15/2015 1055   BILITOT 0.5 02/15/2015 1055        PENDING LABS:   RADIOGRAPHIC STUDIES:  No results found.   PATHOLOGY:    ASSESSMENT AND PLAN:  Breast cancer of upper-outer quadrant of left female breast ([place]) Stage IIA (T1N1c) invasive ductal carcinoma of left breast, ER+/PR+/HER2+, with 1/1 sentinel lymph node for metastatic disease.  Oncology history is up-to-date.  She notes a 1-2 day history of increased loose stools, about 5/24 hours.  She notes that it resolved 2 days ago.  She notes that at baseline she has 2-3 per day.  I have recommended Ocean spray to her nose for infrequent dried blood in nares.  She may also place Vasoline to her nares to maintain moist mucous membranes.  Pre-chemo labs today: CBC diff, CMET.  Labs meet treatment parameters today.  HGB is stable.  Platelets are WNL.  Return as scheduled in 1 week for follow-up and treatment.   THERAPY PLAN:  Continue with weekly treatment as planned.  All questions were answered. The patient knows to call the clinic with any problems, questions or concerns. We can certainly see the patient much sooner if necessary.  Patient and plan discussed with Dr. Ancil Linsey and she is in agreement with the aforementioned.   This note is electronically signed by: Doy Mince 02/15/2015 11:46 AM

## 2015-02-15 NOTE — Patient Instructions (Signed)
[place] at [facility] Discharge Instructions  RECOMMENDATIONS MADE BY THE CONSULTANT AND ANY TEST RESULTS WILL BE SENT TO YOUR REFERRING PHYSICIAN.  Exam and discussion by Robynn Pane, PA-C Will treat today. Report fevers, uncontrolled nausea, vomiting or other concerns. Can use over the counter Ocean Nasal Spray as needed Use nausea medications as needed  Follow-up as scheduled.  Thank you for choosing [place] at [facility] to provide your oncology and hematology care.  To afford each patient quality time with our provider, please arrive at least 15 minutes before your scheduled appointment time.    You need to re-schedule your appointment should you arrive 10 or more minutes late.  We strive to give you quality time with our providers, and arriving late affects you and other patients whose appointments are after yours.  Also, if you no show three or more times for appointments you may be dismissed from the clinic at the providers discretion.     Again, thank you for choosing [facility].  Our hope is that these requests will decrease the amount of time that you wait before being seen by our physicians.       _____________________________________________________________  Should you have questions after your visit to [facility], please contact our office at [phone] between the hours of 8:30 a.m. and 4:30 p.m.  Voicemails left after 4:30 p.m. will not be returned until the following business day.  For prescription refill requests, have your pharmacy contact our office.

## 2015-02-22 ENCOUNTER — Telehealth (HOSPITAL_COMMUNITY): Payer: Self-pay | Admitting: Emergency Medicine

## 2015-02-22 ENCOUNTER — Encounter (HOSPITAL_COMMUNITY): Payer: Self-pay

## 2015-02-22 ENCOUNTER — Encounter (HOSPITAL_BASED_OUTPATIENT_CLINIC_OR_DEPARTMENT_OTHER): Payer: Medicare Other

## 2015-02-22 VITALS — BP 172/68 | HR 74 | Temp 98.0°F | Resp 18 | Wt 297.0 lb

## 2015-02-22 DIAGNOSIS — C50412 Malignant neoplasm of upper-outer quadrant of left female breast: Secondary | ICD-10-CM | POA: Diagnosis not present

## 2015-02-22 DIAGNOSIS — Z5112 Encounter for antineoplastic immunotherapy: Secondary | ICD-10-CM

## 2015-02-22 DIAGNOSIS — Z5111 Encounter for antineoplastic chemotherapy: Secondary | ICD-10-CM | POA: Diagnosis present

## 2015-02-22 LAB — CBC WITH DIFFERENTIAL/PLATELET
BASOS ABS: 0 10*3/uL (ref 0.0–0.1)
BASOS PCT: 0 %
EOS ABS: 0.1 10*3/uL (ref 0.0–0.7)
Eosinophils Relative: 2 %
HCT: 33.7 % — ABNORMAL LOW (ref 36.0–46.0)
HEMOGLOBIN: 10.4 g/dL — AB (ref 12.0–15.0)
LYMPHS ABS: 1.9 10*3/uL (ref 0.7–4.0)
Lymphocytes Relative: 42 %
MCH: 26.3 pg (ref 26.0–34.0)
MCHC: 30.9 g/dL (ref 30.0–36.0)
MCV: 85.1 fL (ref 78.0–100.0)
MONO ABS: 0.2 10*3/uL (ref 0.1–1.0)
MONOS PCT: 5 %
NEUTROS ABS: 2.3 10*3/uL (ref 1.7–7.7)
NEUTROS PCT: 51 %
Platelets: 306 10*3/uL (ref 150–400)
RBC: 3.96 MIL/uL (ref 3.87–5.11)
RDW: 16.3 % — ABNORMAL HIGH (ref 11.5–15.5)
WBC: 4.5 10*3/uL (ref 4.0–10.5)

## 2015-02-22 LAB — COMPREHENSIVE METABOLIC PANEL
ALBUMIN: 3.4 g/dL — AB (ref 3.5–5.0)
ALK PHOS: 81 U/L (ref 38–126)
ALT: 18 U/L (ref 14–54)
ANION GAP: 6 (ref 5–15)
AST: 17 U/L (ref 15–41)
BUN: 10 mg/dL (ref 6–20)
CALCIUM: 8.8 mg/dL — AB (ref 8.9–10.3)
CO2: 32 mmol/L (ref 22–32)
Chloride: 100 mmol/L — ABNORMAL LOW (ref 101–111)
Creatinine, Ser: 0.76 mg/dL (ref 0.44–1.00)
GFR calc Af Amer: >60 mL/min (ref >60)
GFR calc non Af Amer: >60 mL/min (ref >60)
GLUCOSE: 118 mg/dL — AB (ref 65–99)
Potassium: 3.8 mmol/L (ref 3.5–5.1)
SODIUM: 138 mmol/L (ref 135–145)
Total Bilirubin: 0.6 mg/dL (ref 0.3–1.2)
Total Protein: 7.4 g/dL (ref 6.5–8.1)

## 2015-02-22 MED ORDER — DIPHENHYDRAMINE HCL 25 MG PO CAPS
ORAL_CAPSULE | ORAL | Status: AC
  Filled 2015-02-22: qty 1

## 2015-02-22 MED ORDER — PALONOSETRON HCL INJECTION 0.25 MG/5ML
0.2500 mg | Freq: Once | INTRAVENOUS | Status: AC
  Administered 2015-02-22: 0.25 mg via INTRAVENOUS
  Filled 2015-02-22: qty 5

## 2015-02-22 MED ORDER — DIPHENHYDRAMINE HCL 25 MG PO CAPS
50.0000 mg | ORAL_CAPSULE | Freq: Once | ORAL | Status: AC
Start: 2015-02-22 — End: 2015-02-22
  Administered 2015-02-22: 50 mg via ORAL
  Filled 2015-02-22: qty 2

## 2015-02-22 MED ORDER — TRASTUZUMAB CHEMO INJECTION 440 MG
2.0000 mg/kg | Freq: Once | INTRAVENOUS | Status: AC
  Administered 2015-02-22: 273 mg via INTRAVENOUS
  Filled 2015-02-22: qty 13

## 2015-02-22 MED ORDER — ACETAMINOPHEN 325 MG PO TABS
650.0000 mg | ORAL_TABLET | Freq: Once | ORAL | Status: AC
Start: 2015-02-22 — End: 2015-02-22
  Administered 2015-02-22: 650 mg via ORAL

## 2015-02-22 MED ORDER — HEPARIN SOD (PORK) LOCK FLUSH 100 UNIT/ML IV SOLN
500.0000 [IU] | Freq: Once | INTRAVENOUS | Status: AC | PRN
  Administered 2015-02-22: 500 [IU]
  Filled 2015-02-22: qty 5

## 2015-02-22 MED ORDER — ACETAMINOPHEN 325 MG PO TABS
ORAL_TABLET | ORAL | Status: AC
  Filled 2015-02-22: qty 2

## 2015-02-22 MED ORDER — SODIUM CHLORIDE 0.9 % IJ SOLN
10.0000 mL | INTRAMUSCULAR | Status: DC | PRN
  Administered 2015-02-22: 10 mL
  Filled 2015-02-22: qty 10

## 2015-02-22 MED ORDER — SODIUM CHLORIDE 0.9 % IV SOLN
Freq: Once | INTRAVENOUS | Status: AC
  Administered 2015-02-22: 10:00:00 via INTRAVENOUS

## 2015-02-22 MED ORDER — PACLITAXEL PROTEIN-BOUND CHEMO INJECTION 100 MG
100.0000 mg/m2 | Freq: Once | INTRAVENOUS | Status: AC
  Administered 2015-02-22: 250 mg via INTRAVENOUS
  Filled 2015-02-22: qty 50

## 2015-02-22 NOTE — Telephone Encounter (Signed)
-----   Message from Baird Cancer, PA-C sent at 02/22/2015  4:00 PM EST ----- I have reviewed all lab results which are normal or stable. Please inform the patient.

## 2015-02-22 NOTE — Progress Notes (Signed)
Patient tolerated infusion well.  VSS.   

## 2015-02-22 NOTE — Telephone Encounter (Signed)
Pt notified of lab work, verbalized understanding

## 2015-02-22 NOTE — Patient Instructions (Signed)
[facility] Discharge Instructions for Patients Receiving Chemotherapy  Today you received the following chemotherapy agents: Herceptin and Abraxane.   If you develop nausea and vomiting, or diarrhea that is not controlled by your medication, call the clinic.  The clinic phone number is [phone]. Office hours are Monday-Friday 8:30am-5:00pm.  BELOW ARE SYMPTOMS THAT SHOULD BE REPORTED IMMEDIATELY:  *FEVER GREATER THAN 101.0 F  *CHILLS WITH OR WITHOUT FEVER  NAUSEA AND VOMITING THAT IS NOT CONTROLLED WITH YOUR NAUSEA MEDICATION  *UNUSUAL SHORTNESS OF BREATH  *UNUSUAL BRUISING OR BLEEDING  TENDERNESS IN MOUTH AND THROAT WITH OR WITHOUT PRESENCE OF ULCERS  *URINARY PROBLEMS  *BOWEL PROBLEMS  UNUSUAL RASH Items with * indicate a potential emergency and should be followed up as soon as possible. If you have an emergency after office hours please contact your primary care physician or go to the nearest emergency department.  Please call the clinic during office hours if you have any questions or concerns.   You may also contact the Patient Navigator at [phone] should you have any questions or need assistance in obtaining follow up care.

## 2015-03-01 ENCOUNTER — Encounter (HOSPITAL_COMMUNITY): Payer: Self-pay | Admitting: Oncology

## 2015-03-01 ENCOUNTER — Encounter (HOSPITAL_BASED_OUTPATIENT_CLINIC_OR_DEPARTMENT_OTHER): Payer: Medicare Other | Admitting: Oncology

## 2015-03-01 ENCOUNTER — Encounter (HOSPITAL_BASED_OUTPATIENT_CLINIC_OR_DEPARTMENT_OTHER): Payer: Medicare Other

## 2015-03-01 VITALS — BP 153/73 | HR 73 | Temp 97.8°F | Resp 18

## 2015-03-01 VITALS — BP 162/73 | HR 77 | Temp 98.0°F | Resp 18 | Wt 294.0 lb

## 2015-03-01 DIAGNOSIS — C773 Secondary and unspecified malignant neoplasm of axilla and upper limb lymph nodes: Secondary | ICD-10-CM

## 2015-03-01 DIAGNOSIS — Z5111 Encounter for antineoplastic chemotherapy: Secondary | ICD-10-CM

## 2015-03-01 DIAGNOSIS — D509 Iron deficiency anemia, unspecified: Secondary | ICD-10-CM

## 2015-03-01 DIAGNOSIS — C50412 Malignant neoplasm of upper-outer quadrant of left female breast: Secondary | ICD-10-CM

## 2015-03-01 DIAGNOSIS — D649 Anemia, unspecified: Secondary | ICD-10-CM

## 2015-03-01 DIAGNOSIS — Z5112 Encounter for antineoplastic immunotherapy: Secondary | ICD-10-CM | POA: Diagnosis not present

## 2015-03-01 DIAGNOSIS — L738 Other specified follicular disorders: Secondary | ICD-10-CM

## 2015-03-01 LAB — COMPREHENSIVE METABOLIC PANEL
ALK PHOS: 72 U/L (ref 38–126)
ALT: 15 U/L (ref 14–54)
AST: 15 U/L (ref 15–41)
Albumin: 3.5 g/dL (ref 3.5–5.0)
Anion gap: 6 (ref 5–15)
BUN: 10 mg/dL (ref 6–20)
CALCIUM: 8.8 mg/dL — AB (ref 8.9–10.3)
CO2: 31 mmol/L (ref 22–32)
CREATININE: 0.7 mg/dL (ref 0.44–1.00)
Chloride: 100 mmol/L — ABNORMAL LOW (ref 101–111)
Glucose, Bld: 112 mg/dL — ABNORMAL HIGH (ref 65–99)
Potassium: 3.5 mmol/L (ref 3.5–5.1)
Sodium: 137 mmol/L (ref 135–145)
Total Bilirubin: 0.5 mg/dL (ref 0.3–1.2)
Total Protein: 7.3 g/dL (ref 6.5–8.1)

## 2015-03-01 LAB — CBC WITH DIFFERENTIAL/PLATELET
BASOS ABS: 0 10*3/uL (ref 0.0–0.1)
Basophils Relative: 0 %
Eosinophils Absolute: 0.1 10*3/uL (ref 0.0–0.7)
Eosinophils Relative: 2 %
HEMATOCRIT: 31.3 % — AB (ref 36.0–46.0)
Hemoglobin: 9.7 g/dL — ABNORMAL LOW (ref 12.0–15.0)
LYMPHS PCT: 33 %
Lymphs Abs: 1.7 10*3/uL (ref 0.7–4.0)
MCH: 26.4 pg (ref 26.0–34.0)
MCHC: 31 g/dL (ref 30.0–36.0)
MCV: 85.1 fL (ref 78.0–100.0)
Monocytes Absolute: 0.3 10*3/uL (ref 0.1–1.0)
Monocytes Relative: 6 %
NEUTROS ABS: 3.1 10*3/uL (ref 1.7–7.7)
Neutrophils Relative %: 59 %
Platelets: 289 10*3/uL (ref 150–400)
RBC: 3.68 MIL/uL — ABNORMAL LOW (ref 3.87–5.11)
RDW: 16.7 % — ABNORMAL HIGH (ref 11.5–15.5)
WBC: 5.3 10*3/uL (ref 4.0–10.5)

## 2015-03-01 MED ORDER — PALONOSETRON HCL INJECTION 0.25 MG/5ML
INTRAVENOUS | Status: AC
  Filled 2015-03-01: qty 5

## 2015-03-01 MED ORDER — PACLITAXEL PROTEIN-BOUND CHEMO INJECTION 100 MG
100.0000 mg/m2 | Freq: Once | INTRAVENOUS | Status: AC
  Administered 2015-03-01: 250 mg via INTRAVENOUS
  Filled 2015-03-01: qty 50

## 2015-03-01 MED ORDER — SODIUM CHLORIDE 0.9 % IJ SOLN
10.0000 mL | INTRAMUSCULAR | Status: DC | PRN
  Administered 2015-03-01: 10 mL
  Filled 2015-03-01: qty 10

## 2015-03-01 MED ORDER — DIPHENHYDRAMINE HCL 25 MG PO CAPS
50.0000 mg | ORAL_CAPSULE | Freq: Once | ORAL | Status: AC
  Administered 2015-03-01: 50 mg via ORAL

## 2015-03-01 MED ORDER — TRASTUZUMAB CHEMO INJECTION 440 MG
2.0000 mg/kg | Freq: Once | INTRAVENOUS | Status: AC
  Administered 2015-03-01: 273 mg via INTRAVENOUS
  Filled 2015-03-01: qty 13

## 2015-03-01 MED ORDER — ACETAMINOPHEN 325 MG PO TABS
ORAL_TABLET | ORAL | Status: AC
  Filled 2015-03-01: qty 2

## 2015-03-01 MED ORDER — DOXYCYCLINE HYCLATE 100 MG PO TABS: 100.0000 mg | ORAL_TABLET | Freq: Two times a day (BID) | ORAL | Status: DC

## 2015-03-01 MED ORDER — SODIUM CHLORIDE 0.9 % IV SOLN
Freq: Once | INTRAVENOUS | Status: AC
  Administered 2015-03-01: 11:00:00 via INTRAVENOUS

## 2015-03-01 MED ORDER — PALONOSETRON HCL INJECTION 0.25 MG/5ML
0.2500 mg | Freq: Once | INTRAVENOUS | Status: AC
  Administered 2015-03-01: 0.25 mg via INTRAVENOUS

## 2015-03-01 MED ORDER — ACETAMINOPHEN 325 MG PO TABS
650.0000 mg | ORAL_TABLET | Freq: Once | ORAL | Status: AC
  Administered 2015-03-01: 650 mg via ORAL

## 2015-03-01 MED ORDER — DIPHENHYDRAMINE HCL 25 MG PO CAPS
ORAL_CAPSULE | ORAL | Status: AC
  Filled 2015-03-01: qty 2

## 2015-03-01 MED ORDER — HEPARIN SOD (PORK) LOCK FLUSH 100 UNIT/ML IV SOLN
500.0000 [IU] | Freq: Once | INTRAVENOUS | Status: AC | PRN
  Administered 2015-03-01: 500 [IU]
  Filled 2015-03-01: qty 5

## 2015-03-01 NOTE — Patient Instructions (Signed)
[place] at [facility] Discharge Instructions  RECOMMENDATIONS MADE BY THE CONSULTANT AND ANY TEST RESULTS WILL BE SENT TO YOUR REFERRING PHYSICIAN.  Exam and discussion by Robynn Pane, PA-C Doxycycline - take as directed for 7 days. Will check some additional labs next week. 2D - echo as ordered Report fevers, uncontrolled nausea, vomiting or other concerns.  Follow-up as scheduled.  Thank you for choosing [place] at [facility] to provide your oncology and hematology care.  To afford each patient quality time with our provider, please arrive at least 15 minutes before your scheduled appointment time.    You need to re-schedule your appointment should you arrive 10 or more minutes late.  We strive to give you quality time with our providers, and arriving late affects you and other patients whose appointments are after yours.  Also, if you no show three or more times for appointments you may be dismissed from the clinic at the providers discretion.     Again, thank you for choosing [facility].  Our hope is that these requests will decrease the amount of time that you wait before being seen by our physicians.       _____________________________________________________________  Should you have questions after your visit to [facility], please contact our office at [phone] between the hours of 8:30 a.m. and 4:30 p.m.  Voicemails left after 4:30 p.m. will not be returned until the following business day.  For prescription refill requests, have your pharmacy contact our office.

## 2015-03-01 NOTE — Progress Notes (Signed)
Kristy Mustache, MD [address] [place] [address]  Breast cancer of upper-outer quadrant of left female breast [facility]) - Plan: ECHOCARDIOGRAM COMPLETE, CBC with Differential, Comprehensive metabolic panel  Bacterial folliculitis - Plan: doxycycline (VIBRA-TABS) 100 MG tablet  Iron deficiency anemia - Plan: Iron and TIBC, Ferritin  Anemia, unspecified anemia type - Plan: Vitamin B12, Folate  CURRENT THERAPY: Adjuvant Abraxane/Herceptin  INTERVAL HISTORY: Kristy Harris 69 y.o. 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    Breast cancer of upper-outer quadrant of left female breast ([place])   11/01/2014 Mammogram Possible mass in the left breast upper outer quadrant measuring 13 mm suspicious for breast cancer confirmed through ultrasound a spiculated hypoechoic mass ill-defined, no enlarged lymph nodes   11/01/2014 Initial Diagnosis Invasive ductal carcinoma, moderately differentiated, ER > 90%, PR> 90%, HER-2 -2+ by IHC, ratio 1.15, KI 67: 29%, T1 cN0 stage IA clinical stage   11/24/2014 Echocardiogram Systolic function was normal. The estimated ejection fraction was in the range of 55% to 60%.    12/25/2014 Surgery Left lumpectomy: IDC 1.7 cm, positive for LVI, with DCIS, 1/1 sentinel node positive deposit 1.9 cm with extracapsular extension, ER 90%, PR 90%, HER-2 positive ratio 2.4, Ki-67 29% T1 cN1 stage II a   02/01/2015 -  Chemotherapy Abraxane/Herceptin    I personally reviewed and went over laboratory results with the patient.  The results are noted within this dictation.  She meets parameters for treatment today.  She notes bilateral LE pain.  She describes it as an ache. She is given other descriptors and she denies any of those including burning, numbness, tingling.  She denies any unilateral discomfort.  She denies any falls.  She denies  any interference with ability to ambulate.  She notes "new lumps."  She shows me a left medial clavicular lesion that is erythematous, raised, pustule.  She also has acne-like rash on scalp.  She otherwise denies any complaints or issues.  Past Medical History  Diagnosis Date  . Diabetes mellitus   . Morbid obesity ([place])   . Recurrent boils     of perineal and buttocks  . Hypertension   . Hyperlipidemia   . COPD (chronic obstructive pulmonary disease) ([place])   . Enlarged heart   . Anemia   . Arthritis   . Heart murmur   . Urinary frequency   . Sleep apnea     no cpap used since weight loss  . H. pylori infection   . Diverticulosis april 2016  . Hidradenitis suppurativa   . PONV (postoperative nausea and vomiting)   . Aortic stenosis     mild AS 11/24/14 echo  . Shortness of breath dyspnea     with exertion  . Pneumonia years ago  . GERD (gastroesophageal reflux disease)   . Breast cancer ([place]) 11/01/14    left breast     has Dyspnea; Hypertension; Hyperlipidemia; Obesity; Bruit; Murmur; Abnormal cardiovascular function study; Peripheral vascular disease ([place]); Diabetes mellitus ([place]); Arteritis ([place]); Abscess of buttock, right; Abscess of left thigh; Abscess of left axilla; Iron deficiency anemia; Abscess of skin and subcutaneous tissue; H. pylori infection; Diverticulosis of colon with hemorrhage; Breast cancer of upper-outer quadrant of left female breast ([place]); Encounter for pre-operative cardiovascular clearance; Aortic stenosis; Normal coronary arteries; Morbid obesity ([place]); DJD (degenerative joint disease); Family history of breast cancer; and Genetic testing  on her problem list.     is allergic to lisinopril and penicillins.  Current Outpatient Prescriptions on File Prior to Visit  Medication Sig Dispense Refill  . acetaminophen (TYLENOL) 500 MG tablet Take 1,000 mg by mouth every 6 (six) hours as needed for mild pain or headache.    . albuterol (PROVENTIL HFA;VENTOLIN  HFA) 108 (90 BASE) MCG/ACT inhaler Inhale 2 puffs into the lungs every 4 (four) hours as needed for shortness of breath.    Marland Kitchen atorvastatin (LIPITOR) 20 MG tablet Take 20 mg by mouth every morning.     . bismuth subsalicylate (PEPTO BISMOL) 262 MG chewable tablet Chew 2 tablets (524 mg total) by mouth 2 (two) times daily. (Patient taking differently: Chew 524 mg by mouth 2 (two) times daily as needed for indigestion (acid reflux). ) 22 tablet 0  . Cholecalciferol (VITAMIN D-3) 5000 UNITS TABS Take 5,000 Units by mouth every morning.    . ferrous sulfate 325 (65 FE) MG tablet Take 1 tablet (325 mg total) by mouth every morning. (Patient taking differently: Take 325 mg by mouth 3 (three) times daily with meals. ) 30 tablet 0  . furosemide (LASIX) 20 MG tablet TAKE ONE TABLET BY MOUTH ONCE DAILY.    Marland Kitchen glipiZIDE (GLUCOTROL XL) 10 MG 24 hr tablet Take 10 mg by mouth daily with breakfast.     . HYDROcodone-acetaminophen (NORCO/VICODIN) 5-325 MG per tablet Take 1 tablet by mouth 2 (two) times daily as needed for moderate pain.     Marland Kitchen lidocaine-prilocaine (EMLA) cream Apply a quarter size amount to port site 1 hour prior to chemo. Do not rub in. Cover with plastic wrap. 30 g 3  . losartan (COZAAR) 100 MG tablet Take 100 mg by mouth daily.    . metFORMIN (GLUCOPHAGE-XR) 500 MG 24 hr tablet Take 500 mg by mouth 2 (two) times daily.     . metoprolol (TOPROL-XL) 100 MG 24 hr tablet Take 100 mg by mouth every morning.     . ondansetron (ZOFRAN) 8 MG tablet Take 1 tablet (8 mg total) by mouth every 8 (eight) hours as needed for nausea or vomiting. 30 tablet 2  . PACLitaxel Protein-Bound Part (ABRAXANE IV) Inject into the vein. To be given weekly    . pantoprazole (PROTONIX) 40 MG tablet TAKE 1 TABLET (40 MG TOTAL) BY MOUTH DAILY. 90 tablet 0  . polyethylene glycol (MIRALAX / GLYCOLAX) packet Take 17 g by mouth daily as needed for mild constipation. 14 each 0  . prochlorperazine (COMPAZINE) 10 MG tablet Take 1 tablet  (10 mg total) by mouth every 6 (six) hours as needed for nausea or vomiting. 30 tablet 2  . Trastuzumab (HERCEPTIN IV) Inject into the vein. To be given weekly    . verapamil (CALAN-SR) 120 MG CR tablet Take 120 mg by mouth daily.    Marland Kitchen ketoconazole (NIZORAL) 2 % cream Reported on 03/01/2015  0  . sulfamethoxazole-trimethoprim (BACTRIM,SEPTRA) 400-80 MG per tablet Reported on 03/01/2015     Current Facility-Administered Medications on File Prior to Visit  Medication Dose Route Frequency Provider Last Rate Last Dose  . heparin lock flush 100 unit/mL  500 Units Intracatheter Once PRN Patrici Ranks, MD      . PACLitaxel-protein bound (ABRAXANE) chemo infusion 250 mg  100 mg/m2 (Treatment Plan Actual) Intravenous Once Patrici Ranks, MD      . sodium chloride 0.9 % injection 10 mL  10 mL Intracatheter PRN Patrici Ranks, MD  10 mL at 03/01/15 1100  . trastuzumab (HERCEPTIN) 273 mg in sodium chloride 0.9 % 250 mL chemo infusion  2 mg/kg (Treatment Plan Actual) Intravenous Once Patrici Ranks, MD        Past Surgical History  Procedure Laterality Date  . Pilonidal cyst excision    . Bladder suspension      x 2  . Abcess drainage      on bottom  . Cardiac catheterization  02/21/2011    Normal coronary arteries, normal EF  . Tonsillectomy  yrs ago  . Irrigation and debridement abscess Right 12/23/2012    Procedure: incision  AND DEBRIDEMENT right buttock infection ;  Surgeon: Shann Medal, MD;  Location: [facility];  Service: General;  Laterality: Right;  . Hydradenitis excision Left 12/08/2013    Procedure: EXCISION HIDRADENITIS AXILLA;  Surgeon: Alphonsa Overall, MD;  Location: [facility];  Service: General;  Laterality: Left;  . Excision hydradenitis labia N/A 12/08/2013    Procedure: EXCISION HIDRADENITIS PUBIC AREA;  Surgeon: Alphonsa Overall, MD;  Location: [facility];  Service: General;  Laterality: N/A;  . Breast lumpectomy with radioactive seed and sentinel lymph node biopsy Left 12/25/2014      Procedure: RADIOACTIVE SEED GIUDED LEFT BREAST LUMPECTOMY, LEFT AXILLARY SENTINEL LYMPH NODE BIOPSY;  Surgeon: Alphonsa Overall, MD;  Location: [facility];  Service: General;  Laterality: Left;  . Abdominal hysterectomy  25 yrs ago    partial  . Portacath placement N/A 01/12/2015    Procedure: INSERTION PORT-A-CATH;  Surgeon: Alphonsa Overall, MD;  Location: [facility];  Service: General;  Laterality: N/A;    Denies any headaches, dizziness, double vision, fevers, chills, night sweats, nausea, vomiting, diarrhea, constipation, chest pain, heart palpitations, shortness of breath, blood in stool, black tarry stool, urinary pain, urinary burning, urinary frequency, hematuria.   PHYSICAL EXAMINATION  ECOG PERFORMANCE STATUS: 2 - Symptomatic, <50% confined to bed  Filed Vitals:   03/01/15 1009  BP: 162/73  Pulse: 77  Temp: 98 F (36.7 C)  Resp: 18    GENERAL:alert, no distress, comfortable, cooperative, obese, smiling and in an exam room, accompanied by multiple family members. SKIN: skin color, texture, turgor are normal. Right medial clavicular pustular lesion, measuring 5 mm in size that is raised and erythematous. HEAD: Normocephalic, No masses, tenderness.  Diffuse acne-like rash on scalp. EYES: normal, EOMI, Conjunctiva are pink and non-injected EARS: External ears normal OROPHARYNX:lips, buccal mucosa, and tongue normal and mucous membranes are moist  NECK: supple, trachea midline LYMPH:  Not examined BREAST:not examined LUNGS: clear to auscultation HEART: RRR ABDOMEN:abdomen soft, non-tender, obese and normal bowel sounds BACK: Back symmetric, no curvature., No CVA tenderness EXTREMITIES:less then 2 second capillary refill, no joint deformities, effusion, or inflammation, no skin discoloration, no cyanosis, positive findings:  edema 1+ pitting edema in B/L LE.  NEURO: alert & oriented x 3 with fluent speech, no focal motor/sensory deficits   LABORATORY DATA: CBC    Component Value  Date/Time   WBC 5.3 03/01/2015 1024   RBC 3.68* 03/01/2015 1024   HGB 9.7* 03/01/2015 1024   HCT 31.3* 03/01/2015 1024   PLT 289 03/01/2015 1024   MCV 85.1 03/01/2015 1024   MCH 26.4 03/01/2015 1024   MCHC 31.0 03/01/2015 1024   RDW 16.7* 03/01/2015 1024   LYMPHSABS 1.7 03/01/2015 1024   MONOABS 0.3 03/01/2015 1024   EOSABS 0.1 03/01/2015 1024   BASOSABS 0.0 03/01/2015 1024      Chemistry  Component Value Date/Time   NA 137 03/01/2015 1024   K 3.5 03/01/2015 1024   CL 100* 03/01/2015 1024   CO2 31 03/01/2015 1024   BUN 10 03/01/2015 1024   CREATININE 0.70 03/01/2015 1024      Component Value Date/Time   CALCIUM 8.8* 03/01/2015 1024   ALKPHOS 72 03/01/2015 1024   AST 15 03/01/2015 1024   ALT 15 03/01/2015 1024   BILITOT 0.5 03/01/2015 1024        PENDING LABS:   RADIOGRAPHIC STUDIES:  No results found.   PATHOLOGY:    ASSESSMENT AND PLAN:  Breast cancer of upper-outer quadrant of left female breast ([place]) Stage IIA (T1N1c) invasive ductal carcinoma of left breast, ER+/PR+/HER2+, with 1/1 sentinel lymph node for metastatic disease.  Oncology history is updated.  Pre-chemo labs today: CBC diff, CMET.  Labs meet treatment parameters today.  HGB is stable but has declined slightly.  Platelets are WNL.  I have added an anemia panel to her labs next week when she returns for her next treatment.  She is not a candidate for ESA therapy as she is being treated with curative intent.  It has been 12 weeks since her last echo, but has only received 5-6 weeks worth of Herceptin.  It is noted that she has grade 1 diastolic heart disease on previous echo in September.  I will get her set-up for another in 1-2 weeks.  I have escribed a 7 day course of Doxycycline for folliculitis of her scalp. She has a strong history of skin abscesses.  Return as scheduled in 2 weeks for follow-up and treatment.   THERAPY PLAN:  Continue with weekly treatment as planned.  All  questions were answered. The patient knows to call the clinic with any problems, questions or concerns. We can certainly see the patient much sooner if necessary.  Patient and plan discussed with Dr. Ancil Linsey and she is in agreement with the aforementioned.   This note is electronically signed by: Doy Mince 03/01/2015 11:23 AM

## 2015-03-01 NOTE — Progress Notes (Signed)
Tolerated chemo without problems 

## 2015-03-01 NOTE — Assessment & Plan Note (Addendum)
Stage IIA (T1N1c) invasive ductal carcinoma of left breast, ER+/PR+/HER2+, with 1/1 sentinel lymph node for metastatic disease.  Oncology history is updated.  Pre-chemo labs today: CBC diff, CMET.  Labs meet treatment parameters today.  HGB is stable but has declined slightly.  Platelets are WNL.  I have added an anemia panel to her labs next week when she returns for her next treatment.  She is not a candidate for ESA therapy as she is being treated with curative intent.  It has been 12 weeks since her last echo, but has only received 5-6 weeks worth of Herceptin.  It is noted that she has grade 1 diastolic heart disease on previous echo in September.  I will get her set-up for another in 1-2 weeks.  I have escribed a 7 day course of Doxycycline for folliculitis of her scalp. She has a strong history of skin abscesses.  Return as scheduled in 2 weeks for follow-up and treatment.

## 2015-03-01 NOTE — Patient Instructions (Signed)
..  [facility] Discharge Instructions for Patients Receiving Chemotherapy  Today you received the following chemotherapy agents herceptin and abraxane Take your nausea meds as prescribed  If you develop nausea and vomiting, or diarrhea that is not controlled by your medication, call the clinic.  The clinic phone number is [phone]. Office hours are Monday-Friday 8:30am-5:00pm.  BELOW ARE SYMPTOMS THAT SHOULD BE REPORTED IMMEDIATELY:  *FEVER GREATER THAN 101.0 F  *CHILLS WITH OR WITHOUT FEVER  NAUSEA AND VOMITING THAT IS NOT CONTROLLED WITH YOUR NAUSEA MEDICATION  *UNUSUAL SHORTNESS OF BREATH  *UNUSUAL BRUISING OR BLEEDING  TENDERNESS IN MOUTH AND THROAT WITH OR WITHOUT PRESENCE OF ULCERS  *URINARY PROBLEMS  *BOWEL PROBLEMS  UNUSUAL RASH Items with * indicate a potential emergency and should be followed up as soon as possible. If you have an emergency after office hours please contact your primary care physician or go to the nearest emergency department.  Please call the clinic during office hours if you have any questions or concerns.   You may also contact the Patient Navigator at [phone] should you have any questions or need assistance in obtaining follow up care.

## 2015-03-08 ENCOUNTER — Encounter (HOSPITAL_COMMUNITY): Payer: Medicare Other | Attending: Hematology & Oncology

## 2015-03-08 ENCOUNTER — Encounter (HOSPITAL_COMMUNITY): Payer: Self-pay

## 2015-03-08 VITALS — BP 142/56 | HR 71 | Temp 97.7°F | Resp 20

## 2015-03-08 DIAGNOSIS — D509 Iron deficiency anemia, unspecified: Secondary | ICD-10-CM | POA: Insufficient documentation

## 2015-03-08 DIAGNOSIS — Z5111 Encounter for antineoplastic chemotherapy: Secondary | ICD-10-CM | POA: Diagnosis not present

## 2015-03-08 DIAGNOSIS — Z5112 Encounter for antineoplastic immunotherapy: Secondary | ICD-10-CM

## 2015-03-08 DIAGNOSIS — C50412 Malignant neoplasm of upper-outer quadrant of left female breast: Secondary | ICD-10-CM | POA: Insufficient documentation

## 2015-03-08 DIAGNOSIS — L738 Other specified follicular disorders: Secondary | ICD-10-CM | POA: Diagnosis present

## 2015-03-08 DIAGNOSIS — D649 Anemia, unspecified: Secondary | ICD-10-CM | POA: Insufficient documentation

## 2015-03-08 LAB — COMPREHENSIVE METABOLIC PANEL
ALT: 17 U/L (ref 14–54)
AST: 17 U/L (ref 15–41)
Albumin: 3.5 g/dL (ref 3.5–5.0)
Alkaline Phosphatase: 85 U/L (ref 38–126)
Anion gap: 6 (ref 5–15)
BILIRUBIN TOTAL: 0.4 mg/dL (ref 0.3–1.2)
BUN: 8 mg/dL (ref 6–20)
CHLORIDE: 99 mmol/L — AB (ref 101–111)
CO2: 32 mmol/L (ref 22–32)
CREATININE: 0.79 mg/dL (ref 0.44–1.00)
Calcium: 8.8 mg/dL — ABNORMAL LOW (ref 8.9–10.3)
Glucose, Bld: 109 mg/dL — ABNORMAL HIGH (ref 65–99)
POTASSIUM: 3.5 mmol/L (ref 3.5–5.1)
Sodium: 137 mmol/L (ref 135–145)
TOTAL PROTEIN: 7.3 g/dL (ref 6.5–8.1)

## 2015-03-08 LAB — CBC WITH DIFFERENTIAL/PLATELET
Basophils Absolute: 0 10*3/uL (ref 0.0–0.1)
Basophils Relative: 0 %
EOS PCT: 3 %
Eosinophils Absolute: 0.1 10*3/uL (ref 0.0–0.7)
HCT: 32.4 % — ABNORMAL LOW (ref 36.0–46.0)
Hemoglobin: 9.9 g/dL — ABNORMAL LOW (ref 12.0–15.0)
LYMPHS ABS: 1.9 10*3/uL (ref 0.7–4.0)
LYMPHS PCT: 34 %
MCH: 25.9 pg — AB (ref 26.0–34.0)
MCHC: 30.6 g/dL (ref 30.0–36.0)
MCV: 84.8 fL (ref 78.0–100.0)
MONO ABS: 0.4 10*3/uL (ref 0.1–1.0)
Monocytes Relative: 7 %
Neutro Abs: 3.2 10*3/uL (ref 1.7–7.7)
Neutrophils Relative %: 56 %
PLATELETS: 322 10*3/uL (ref 150–400)
RBC: 3.82 MIL/uL — ABNORMAL LOW (ref 3.87–5.11)
RDW: 17.3 % — AB (ref 11.5–15.5)
WBC: 5.7 10*3/uL (ref 4.0–10.5)

## 2015-03-08 LAB — FERRITIN: Ferritin: 58 ng/mL (ref 11–307)

## 2015-03-08 LAB — IRON AND TIBC
IRON: 32 ug/dL (ref 28–170)
Saturation Ratios: 11 % (ref 10.4–31.8)
TIBC: 284 ug/dL (ref 250–450)
UIBC: 252 ug/dL

## 2015-03-08 LAB — FOLATE: FOLATE: 12.2 ng/mL (ref >5.9)

## 2015-03-08 LAB — VITAMIN B12: Vitamin B-12: 687 pg/mL (ref 180–914)

## 2015-03-08 MED ORDER — ACETAMINOPHEN 325 MG PO TABS
650.0000 mg | ORAL_TABLET | Freq: Once | ORAL | Status: AC
  Administered 2015-03-08: 650 mg via ORAL
  Filled 2015-03-08: qty 2

## 2015-03-08 MED ORDER — DIPHENHYDRAMINE HCL 25 MG PO CAPS
50.0000 mg | ORAL_CAPSULE | Freq: Once | ORAL | Status: AC
  Administered 2015-03-08: 50 mg via ORAL
  Filled 2015-03-08: qty 2

## 2015-03-08 MED ORDER — HEPARIN SOD (PORK) LOCK FLUSH 100 UNIT/ML IV SOLN
500.0000 [IU] | Freq: Once | INTRAVENOUS | Status: AC | PRN
  Administered 2015-03-08: 500 [IU]
  Filled 2015-03-08: qty 5

## 2015-03-08 MED ORDER — PACLITAXEL PROTEIN-BOUND CHEMO INJECTION 100 MG
100.0000 mg/m2 | Freq: Once | INTRAVENOUS | Status: AC
  Administered 2015-03-08: 250 mg via INTRAVENOUS
  Filled 2015-03-08: qty 50

## 2015-03-08 MED ORDER — TRASTUZUMAB CHEMO INJECTION 440 MG
2.0000 mg/kg | Freq: Once | INTRAVENOUS | Status: AC
  Administered 2015-03-08: 273 mg via INTRAVENOUS
  Filled 2015-03-08: qty 13

## 2015-03-08 MED ORDER — SODIUM CHLORIDE 0.9 % IJ SOLN
10.0000 mL | INTRAMUSCULAR | Status: DC | PRN
  Administered 2015-03-08: 10 mL
  Filled 2015-03-08: qty 10

## 2015-03-08 MED ORDER — SODIUM CHLORIDE 0.9 % IV SOLN
Freq: Once | INTRAVENOUS | Status: AC
  Administered 2015-03-08: 11:00:00 via INTRAVENOUS

## 2015-03-08 MED ORDER — PALONOSETRON HCL INJECTION 0.25 MG/5ML
0.2500 mg | Freq: Once | INTRAVENOUS | Status: AC
  Administered 2015-03-08: 0.25 mg via INTRAVENOUS
  Filled 2015-03-08: qty 5

## 2015-03-08 NOTE — Progress Notes (Signed)
1315:  Tolerated tx w/o adverse reaction.  VSS.  A&ox4, in no distress.  Discharged via wheelchair in c/o family.

## 2015-03-08 NOTE — Patient Instructions (Signed)
[facility] Discharge Instructions for Patients Receiving Chemotherapy  Today you received the following chemotherapy agents:  Abraxane and Herceptin  If you develop nausea and vomiting, or diarrhea that is not controlled by your medication, call the clinic.  The clinic phone number is [phone]. Office hours are Monday-Friday 8:30am-5:00pm.  BELOW ARE SYMPTOMS THAT SHOULD BE REPORTED IMMEDIATELY:  *FEVER GREATER THAN 101.0 F  *CHILLS WITH OR WITHOUT FEVER  NAUSEA AND VOMITING THAT IS NOT CONTROLLED WITH YOUR NAUSEA MEDICATION  *UNUSUAL SHORTNESS OF BREATH  *UNUSUAL BRUISING OR BLEEDING  TENDERNESS IN MOUTH AND THROAT WITH OR WITHOUT PRESENCE OF ULCERS  *URINARY PROBLEMS  *BOWEL PROBLEMS  UNUSUAL RASH Items with * indicate a potential emergency and should be followed up as soon as possible. If you have an emergency after office hours please contact your primary care physician or go to the nearest emergency department.  Please call the clinic during office hours if you have any questions or concerns.   You may also contact the Patient Navigator at [phone] should you have any questions or need assistance in obtaining follow up care.

## 2015-03-09 ENCOUNTER — Other Ambulatory Visit (HOSPITAL_COMMUNITY): Payer: Self-pay | Admitting: Oncology

## 2015-03-09 DIAGNOSIS — C50412 Malignant neoplasm of upper-outer quadrant of left female breast: Secondary | ICD-10-CM

## 2015-03-09 DIAGNOSIS — D509 Iron deficiency anemia, unspecified: Secondary | ICD-10-CM

## 2015-03-12 ENCOUNTER — Other Ambulatory Visit (HOSPITAL_COMMUNITY): Payer: Medicare Other

## 2015-03-12 ENCOUNTER — Other Ambulatory Visit: Payer: Self-pay | Admitting: Gastroenterology

## 2015-03-15 ENCOUNTER — Encounter (HOSPITAL_BASED_OUTPATIENT_CLINIC_OR_DEPARTMENT_OTHER): Payer: Medicare Other | Admitting: Hematology & Oncology

## 2015-03-15 ENCOUNTER — Encounter (HOSPITAL_BASED_OUTPATIENT_CLINIC_OR_DEPARTMENT_OTHER): Payer: Medicare Other

## 2015-03-15 ENCOUNTER — Encounter (HOSPITAL_COMMUNITY): Payer: Self-pay | Admitting: Hematology & Oncology

## 2015-03-15 ENCOUNTER — Encounter (HOSPITAL_COMMUNITY): Payer: Medicare Other

## 2015-03-15 VITALS — BP 150/72 | HR 72 | Temp 98.3°F | Resp 18

## 2015-03-15 VITALS — BP 159/53 | HR 87 | Temp 97.7°F | Resp 18 | Wt 290.5 lb

## 2015-03-15 DIAGNOSIS — E669 Obesity, unspecified: Secondary | ICD-10-CM | POA: Diagnosis not present

## 2015-03-15 DIAGNOSIS — G629 Polyneuropathy, unspecified: Secondary | ICD-10-CM | POA: Diagnosis not present

## 2015-03-15 DIAGNOSIS — D509 Iron deficiency anemia, unspecified: Secondary | ICD-10-CM

## 2015-03-15 DIAGNOSIS — Z5111 Encounter for antineoplastic chemotherapy: Secondary | ICD-10-CM

## 2015-03-15 DIAGNOSIS — E611 Iron deficiency: Secondary | ICD-10-CM | POA: Diagnosis not present

## 2015-03-15 DIAGNOSIS — T451X5A Adverse effect of antineoplastic and immunosuppressive drugs, initial encounter: Secondary | ICD-10-CM

## 2015-03-15 DIAGNOSIS — D6481 Anemia due to antineoplastic chemotherapy: Secondary | ICD-10-CM

## 2015-03-15 DIAGNOSIS — Z5112 Encounter for antineoplastic immunotherapy: Secondary | ICD-10-CM | POA: Diagnosis not present

## 2015-03-15 DIAGNOSIS — E119 Type 2 diabetes mellitus without complications: Secondary | ICD-10-CM

## 2015-03-15 DIAGNOSIS — C50412 Malignant neoplasm of upper-outer quadrant of left female breast: Secondary | ICD-10-CM | POA: Diagnosis present

## 2015-03-15 DIAGNOSIS — E1165 Type 2 diabetes mellitus with hyperglycemia: Secondary | ICD-10-CM

## 2015-03-15 LAB — COMPREHENSIVE METABOLIC PANEL
ALBUMIN: 3.5 g/dL (ref 3.5–5.0)
ALK PHOS: 88 U/L (ref 38–126)
ALT: 16 U/L (ref 14–54)
ANION GAP: 8 (ref 5–15)
AST: 16 U/L (ref 15–41)
BUN: 8 mg/dL (ref 6–20)
CALCIUM: 8.9 mg/dL (ref 8.9–10.3)
CHLORIDE: 101 mmol/L (ref 101–111)
CO2: 29 mmol/L (ref 22–32)
Creatinine, Ser: 0.82 mg/dL (ref 0.44–1.00)
GFR calc non Af Amer: >60 mL/min (ref >60)
GLUCOSE: 128 mg/dL — AB (ref 65–99)
POTASSIUM: 3.6 mmol/L (ref 3.5–5.1)
SODIUM: 138 mmol/L (ref 135–145)
Total Bilirubin: 0.9 mg/dL (ref 0.3–1.2)
Total Protein: 7.4 g/dL (ref 6.5–8.1)

## 2015-03-15 LAB — CBC WITH DIFFERENTIAL/PLATELET
BASOS PCT: 0 %
Basophils Absolute: 0 10*3/uL (ref 0.0–0.1)
EOS ABS: 0.1 10*3/uL (ref 0.0–0.7)
EOS PCT: 2 %
HCT: 32 % — ABNORMAL LOW (ref 36.0–46.0)
HEMOGLOBIN: 9.9 g/dL — AB (ref 12.0–15.0)
Lymphocytes Relative: 35 %
Lymphs Abs: 1.8 10*3/uL (ref 0.7–4.0)
MCH: 26.4 pg (ref 26.0–34.0)
MCHC: 30.9 g/dL (ref 30.0–36.0)
MCV: 85.3 fL (ref 78.0–100.0)
MONO ABS: 0.2 10*3/uL (ref 0.1–1.0)
MONOS PCT: 4 %
Neutro Abs: 2.9 10*3/uL (ref 1.7–7.7)
Neutrophils Relative %: 59 %
PLATELETS: 301 10*3/uL (ref 150–400)
RBC: 3.75 MIL/uL — ABNORMAL LOW (ref 3.87–5.11)
RDW: 17.6 % — AB (ref 11.5–15.5)
WBC: 5 10*3/uL (ref 4.0–10.5)

## 2015-03-15 MED ORDER — SODIUM CHLORIDE 0.9 % IV SOLN
125.0000 mg | Freq: Once | INTRAVENOUS | Status: AC
  Administered 2015-03-15: 125 mg via INTRAVENOUS
  Filled 2015-03-15: qty 10

## 2015-03-15 MED ORDER — PALONOSETRON HCL INJECTION 0.25 MG/5ML
0.2500 mg | Freq: Once | INTRAVENOUS | Status: AC
  Administered 2015-03-15: 0.25 mg via INTRAVENOUS
  Filled 2015-03-15: qty 5

## 2015-03-15 MED ORDER — HEPARIN SOD (PORK) LOCK FLUSH 100 UNIT/ML IV SOLN
500.0000 [IU] | Freq: Once | INTRAVENOUS | Status: AC | PRN
  Administered 2015-03-15: 500 [IU]
  Filled 2015-03-15: qty 5

## 2015-03-15 MED ORDER — TRASTUZUMAB CHEMO INJECTION 440 MG
2.0000 mg/kg | Freq: Once | INTRAVENOUS | Status: AC
  Administered 2015-03-15: 273 mg via INTRAVENOUS
  Filled 2015-03-15: qty 13

## 2015-03-15 MED ORDER — SODIUM CHLORIDE 0.9 % IV SOLN
INTRAVENOUS | Status: DC
  Administered 2015-03-15: 10:00:00 via INTRAVENOUS

## 2015-03-15 MED ORDER — PACLITAXEL PROTEIN-BOUND CHEMO INJECTION 100 MG
100.0000 mg/m2 | Freq: Once | INTRAVENOUS | Status: AC
  Administered 2015-03-15: 250 mg via INTRAVENOUS
  Filled 2015-03-15: qty 50

## 2015-03-15 MED ORDER — ACETAMINOPHEN 325 MG PO TABS
650.0000 mg | ORAL_TABLET | Freq: Once | ORAL | Status: AC
  Administered 2015-03-15: 650 mg via ORAL
  Filled 2015-03-15: qty 2

## 2015-03-15 MED ORDER — SODIUM CHLORIDE 0.9 % IV SOLN: Freq: Once | INTRAVENOUS | Status: AC

## 2015-03-15 MED ORDER — SODIUM CHLORIDE 0.9 % IJ SOLN: 10.0000 mL | INTRAMUSCULAR | Status: DC | PRN

## 2015-03-15 MED ORDER — DIPHENHYDRAMINE HCL 25 MG PO CAPS
50.0000 mg | ORAL_CAPSULE | Freq: Once | ORAL | Status: AC
  Administered 2015-03-15: 50 mg via ORAL
  Filled 2015-03-15: qty 2

## 2015-03-15 NOTE — Progress Notes (Signed)
Tolerated iron infusion and chemotherapy infusion well. Stable on discharge home via wheelchair with daughter.

## 2015-03-15 NOTE — Progress Notes (Signed)
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 female is here for follow-up of her ER+, PR+ HER 2 neu + breast cancer. She is doing extremely well with therapy. She is on weekly abraxane and herceptin.   Ms. Barno is accompanied by her sister and receiving treatment during our visit.  Her previous scalp and skin issues have not completely resolved but are improved. I suggested using steroid cream on these areas to further improve this.  Her is mood is good. She is sleeping well. States that she gets a taste for things but then it does not taste the same. Denies breathing issues. Denies bowels issues, however she occasionally has diarrhea and if it persists for more than 1 day she takes an imodium that resolves it. Denies neuropathy in her feet. 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 Denies dysuria. Reports that the zofran resolves her nausea "pretty good" and is unsure if she has a refill for it.  Patient was curious if she could drink orange juice.Denies any problems with heartburn or reflux. She does take protonix and took it previous to beginning treatment.  Her last colonoscopy was performed last September. Denies blood in stool.  Denies pain in breast.    Breast cancer of upper-outer quadrant of left female breast ([place])   11/01/2014 Mammogram Possible mass in the left breast upper outer quadrant measuring 13 mm suspicious for breast cancer confirmed through ultrasound a spiculated hypoechoic mass ill-defined, no enlarged lymph nodes   11/01/2014 Initial Diagnosis Invasive ductal carcinoma, moderately differentiated, ER > 90%, PR> 90%, HER-2 -2+ by IHC, ratio 1.15, KI 67: 29%, T1 cN0 stage IA clinical stage   11/24/2014 Echocardiogram Systolic function was normal. The estimated ejection fraction was in the range of 55% to 60%.    12/25/2014 Surgery Left lumpectomy: IDC 1.7 cm, positive for LVI, with DCIS, 1/1 sentinel node positive deposit 1.9 cm with extracapsular extension, ER 90%, PR 90%, HER-2 positive ratio 2.4, Ki-67 29% T1 cN1 stage II a   02/01/2015 -  Chemotherapy Abraxane/Herceptin     MEDICAL HISTORY:  Past Medical History  Diagnosis Date  . Diabetes mellitus   . Morbid obesity ([place])   . Recurrent boils     of perineal and buttocks  . Hypertension   . Hyperlipidemia   . COPD (chronic obstructive pulmonary disease) ([place])   . Enlarged heart   . Anemia   . Arthritis   . Heart murmur   . Urinary frequency   . Sleep apnea     no cpap used since weight loss  . H. pylori infection   . Diverticulosis april 2016  . Hidradenitis suppurativa   . PONV (postoperative nausea and vomiting)   . Aortic stenosis     mild AS 11/24/14 echo  . Shortness of breath dyspnea     with exertion  .  Pneumonia years ago  . GERD (gastroesophageal reflux disease)   . Breast cancer (HCC) 11/01/14    left breast     SURGICAL HISTORY: Past Surgical History  Procedure Laterality Date  . Pilonidal cyst excision    . Bladder suspension      x 2  . Abcess drainage      on bottom  . Cardiac catheterization  02/21/2011    Normal coronary arteries, normal EF  . Tonsillectomy  yrs ago  . Irrigation and debridement abscess Right 12/23/2012     Procedure: incision  AND DEBRIDEMENT right buttock infection ;  Surgeon: Kandis Cocking, MD;  Location: [facility];  Service: General;  Laterality: Right;  . Hydradenitis excision Left 12/08/2013    Procedure: EXCISION HIDRADENITIS AXILLA;  Surgeon: [name], MD;  Location: [facility];  Service: General;  Laterality: Left;  . Excision hydradenitis labia N/A 12/08/2013    Procedure: EXCISION HIDRADENITIS PUBIC AREA;  Surgeon: [name], MD;  Location: [facility];  Service: General;  Laterality: N/A;  . Breast lumpectomy with radioactive seed and sentinel lymph node biopsy Left 12/25/2014    Procedure: RADIOACTIVE SEED GIUDED LEFT BREAST LUMPECTOMY, LEFT AXILLARY SENTINEL LYMPH NODE BIOPSY;  Surgeon: [name], MD;  Location: [facility];  Service: General;  Laterality: Left;  . Abdominal hysterectomy  25 yrs ago    partial  . Portacath placement N/A 01/12/2015    Procedure: INSERTION PORT-A-CATH;  Surgeon: [name], MD;  Location: [facility];  Service: General;  Laterality: N/A;    SOCIAL HISTORY: Social History   Social History  . Marital Status: Widowed    Spouse Name: N/A  . Number of Children: 5  . Years of Education: N/A   Occupational History  . Textile work     Disabled   Social History Main Topics  . Smoking status: Former Smoker -- 1.00 packs/day for 10 years    Types: Cigarettes    Quit date: 03/03/1994  . Smokeless tobacco: Never Used  . Alcohol Use: No  . Drug Use: No  . Sexual Activity: Not on file   Other Topics Concern  . Not on file   Social History Narrative   Lives with grandson.  Widowed.  Has 2 sons and 3 daughters  Widowed 20 years 5 children, oldest is 68 yo 8 grandchildren 1 great grandchild Worked in a factory She used to enjoy fishing and play softball. Ex smoker, quit 20 years ago. ETOH, none  FAMILY HISTORY: Family History  Problem Relation Age of Onset  . Heart attack Mother     had multiple health problems  . Lung cancer Father 10    smoker  .  Prostate cancer Brother   . Lung cancer Brother     dx. 64s; smoker  . Throat cancer Brother     dx. 30s; smoker  . Diabetes Sister     has 3 living sisters with multiple health problems  . Hypertension Brother   . Hypertension Son   . Hypertension Daughter   . Breast cancer Sister 29  . Breast cancer Sister     dx. 35s  . Breast cancer Maternal Grandmother     dx. 75s  . Breast cancer Maternal Aunt     dx. older than 50  . Dementia Maternal Aunt   . Breast cancer Cousin     dx. 50s   indicated that her mother is deceased. She indicated that her father is deceased. She indicated that two  of her four sisters are alive. She indicated that all of her three brothers are deceased. She indicated that her maternal grandmother is deceased. She indicated that her maternal grandfather is deceased. She indicated that her paternal grandmother is deceased. She indicated that her paternal grandfather is deceased. She indicated that her daughter is alive. She indicated that only one of her two sons is alive. She indicated that both of her maternal aunts are deceased. She indicated that her maternal uncle is deceased. She indicated that her paternal aunt is deceased. She indicated that her cousin is alive.  Mother died at 66 of a heart attack, she had a large heart and a hole in her heart. Non-smoker Father died at 89 of lung cancer, he was a smoker. 9 siblings, 4 living. One sister with breast cancer bilaterally, she is still living Another sister with breast cancer  ALLERGIES:  is allergic to lisinopril and penicillins.  MEDICATIONS:  Current Outpatient Prescriptions  Medication Sig Dispense Refill  . acetaminophen (TYLENOL) 500 MG tablet Take 1,000 mg by mouth every 6 (six) hours as needed for mild pain or headache.    . albuterol (PROVENTIL HFA;VENTOLIN HFA) 108 (90 BASE) MCG/ACT inhaler Inhale 2 puffs into the lungs every 4 (four) hours as needed for shortness of breath.    Marland Kitchen atorvastatin  (LIPITOR) 20 MG tablet Take 20 mg by mouth every morning.     . bismuth subsalicylate (PEPTO BISMOL) 262 MG chewable tablet Chew 2 tablets (524 mg total) by mouth 2 (two) times daily. (Patient taking differently: Chew 524 mg by mouth 2 (two) times daily as needed for indigestion (acid reflux). ) 22 tablet 0  . Cholecalciferol (VITAMIN D-3) 5000 UNITS TABS Take 5,000 Units by mouth every morning.    Marland Kitchen doxycycline (VIBRA-TABS) 100 MG tablet Take 1 tablet (100 mg total) by mouth 2 (two) times daily. 14 tablet 0  . ferrous sulfate 325 (65 FE) MG tablet Take 1 tablet (325 mg total) by mouth every morning. (Patient taking differently: Take 325 mg by mouth 3 (three) times daily with meals. ) 30 tablet 0  . furosemide (LASIX) 20 MG tablet TAKE ONE TABLET BY MOUTH ONCE DAILY.    Marland Kitchen glipiZIDE (GLUCOTROL XL) 10 MG 24 hr tablet Take 10 mg by mouth daily with breakfast.     . HYDROcodone-acetaminophen (NORCO/VICODIN) 5-325 MG per tablet Take 1 tablet by mouth 2 (two) times daily as needed for moderate pain.     Marland Kitchen ketoconazole (NIZORAL) 2 % cream Reported on 03/01/2015  0  . lidocaine-prilocaine (EMLA) cream Apply a quarter size amount to port site 1 hour prior to chemo. Do not rub in. Cover with plastic wrap. 30 g 3  . losartan (COZAAR) 100 MG tablet Take 100 mg by mouth daily.    . metFORMIN (GLUCOPHAGE-XR) 500 MG 24 hr tablet Take 500 mg by mouth 2 (two) times daily.     . metoprolol (TOPROL-XL) 100 MG 24 hr tablet Take 100 mg by mouth every morning.     . ondansetron (ZOFRAN) 8 MG tablet Take 1 tablet (8 mg total) by mouth every 8 (eight) hours as needed for nausea or vomiting. 30 tablet 2  . PACLitaxel Protein-Bound Part (ABRAXANE IV) Inject into the vein. To be given weekly    . pantoprazole (PROTONIX) 40 MG tablet TAKE 1 TABLET (40 MG TOTAL) BY MOUTH DAILY. 90 tablet 0  . polyethylene glycol (MIRALAX / GLYCOLAX) packet Take 17 g by mouth daily as needed  for mild constipation. 14 each 0  . prochlorperazine  (COMPAZINE) 10 MG tablet Take 1 tablet (10 mg total) by mouth every 6 (six) hours as needed for nausea or vomiting. 30 tablet 2  . sulfamethoxazole-trimethoprim (BACTRIM,SEPTRA) 400-80 MG per tablet Reported on 03/01/2015    . Trastuzumab (HERCEPTIN IV) Inject into the vein. To be given weekly    . verapamil (CALAN-SR) 120 MG CR tablet Take 120 mg by mouth daily.     No current facility-administered medications for this visit.   Facility-Administered Medications Ordered in Other Visits  Medication Dose Route Frequency Provider Last Rate Last Dose  . 0.9 %  sodium chloride infusion   Intravenous Continuous Patrici Ranks, MD 10 mL/hr at 03/15/15 1007    . ferric gluconate (NULECIT) 125 mg in sodium chloride 0.9 % 100 mL IVPB  125 mg Intravenous Once Baird Cancer, PA-C   125 mg at 03/15/15 1007    Review of Systems  Constitutional: Negative.  Negative for fever.       Taste change.  HENT: Negative.   Eyes: Negative.   Respiratory: Negative.  Negative for shortness of breath.   Cardiovascular: Negative.   Gastrointestinal: Positive for diarrhea. Negative for heartburn, nausea and blood in stool.       Nausea managed with zofran. Occasional diarrhea, if it persists for more than 1 day she takes an imodium that resolves it.  Genitourinary: Negative for dysuria.  Musculoskeletal: Negative.   Skin: Negative.   Neurological: Positive for tingling.       Neuropathy in hands, unchanged.  Endo/Heme/Allergies: Negative.   Psychiatric/Behavioral: Negative.   All other systems reviewed and are negative.  14 point ROS was done and is otherwise as detailed above or in HPI   PHYSICAL EXAMINATION: ECOG PERFORMANCE STATUS: 2 - Symptomatic, <50% confined to bed  Filed Vitals:   03/15/15 0951  BP: 159/53  Pulse: 87  Temp: 97.7 F (36.5 C)  Resp: 18   Filed Weights   03/15/15 0951  Weight: 290 lb 8 oz (131.77 kg)    Physical Exam  Constitutional: She is oriented to person, place,  and time and well-developed, well-nourished, and in no distress.  Obese. In bed receiving treatment.  HENT:  Head: Normocephalic and atraumatic.  Mouth/Throat: Oropharynx is clear and moist. No oropharyngeal exudate.  Eyes: Conjunctivae and EOM are normal. Pupils are equal, round, and reactive to light. Right eye exhibits no discharge. Left eye exhibits no discharge.  Neck: Normal range of motion. Neck supple. No JVD present. No tracheal deviation present. No thyromegaly present.  Cardiovascular: Normal rate and regular rhythm.   Murmur heard. Systolic ejection murmur.  Pulmonary/Chest: Effort normal and breath sounds normal. No respiratory distress. She has no wheezes. She has no rales. She exhibits no tenderness.    Port in place.  Abdominal: Soft. Bowel sounds are normal. She exhibits no distension. There is no tenderness. There is no rebound and no guarding.  Musculoskeletal: Normal range of motion. She exhibits no tenderness.  Lymphadenopathy:    She has no cervical adenopathy.  Neurological: She is alert and oriented to person, place, and time. No cranial nerve deficit.  Moves LE easily but notes knee pain  Skin: Skin is warm and dry.  Psychiatric: Mood, memory, affect and judgment normal.  Nursing note and vitals reviewed.   LABORATORY DATA:  I have reviewed the data as listed Lab Results  Component Value Date   WBC 5.0 03/15/2015   HGB 9.9*  03/15/2015   HCT 32.0* 03/15/2015   MCV 85.3 03/15/2015   PLT 301 03/15/2015   CMP     Component Value Date/Time   NA 138 03/15/2015 0955   K 3.6 03/15/2015 0955   CL 101 03/15/2015 0955   CO2 29 03/15/2015 0955   GLUCOSE 128* 03/15/2015 0955   BUN 8 03/15/2015 0955   CREATININE 0.82 03/15/2015 0955   CALCIUM 8.9 03/15/2015 0955   PROT 7.4 03/15/2015 0955   ALBUMIN 3.5 03/15/2015 0955   AST 16 03/15/2015 0955   ALT 16 03/15/2015 0955   ALKPHOS 88 03/15/2015 0955   BILITOT 0.9 03/15/2015 0955   GFRNONAA >60 03/15/2015  0955   GFRAA >60 03/15/2015 0955   RADIOLOGY: I have personally reviewed the radiological images as listed and agreed with the findings in the report.  CLINICAL DATA: Status post port placement  EXAM: PORTABLE CHEST - 1 VIEW  COMPARISON: 12/08/2013  FINDINGS: Cardiac shadow remains enlarged. A new right-sided chest wall port is noted with the catheter tip at the cavoatrial junction. Downward depression of the catheter is noted at the first costoclavicular space. Mild atelectasis is noted bilaterally due to a poor inspiratory effort. Some vascular crowding is noted as well. No pneumothorax is seen.  IMPRESSION: No evidence of pneumothorax.  Poor inspiratory effort with bilateral atelectatic changes.  Downward depression of the catheter as described. This may predispose the catheter to potential pinch off syndrome.   Electronically Signed  By: Inez Catalina M.D.  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 She is cleared for treatment today.  She missed her scheduled ECHO because of the weather and we have rescheduled this for next Tuesday.  She is unsure if she needs refills at this time, she will call if she does.   The patient would prefer to receive radiation treatment in [place]. She has previously spoken with Dr. Lisbeth Renshaw. We will plan on referring her to [facility] in the next few weeks.   She will return to see me in 2 weeks.   All questions were answered. The patient knows to call the clinic with any problems, questions or concerns.  This note was electronically signed.    This document serves as a record of services personally performed by Ancil Linsey, MD. It was created on her behalf by Arlyce Harman, a trained medical scribe. The creation of this record is based on the scribe's personal observations and the provider's statements to them. This document has been checked and approved by the attending provider.  I have reviewed the above documentation for accuracy and completeness, and I agree with the above.  Molli Hazard, MD  03/15/2015 10:45 AM

## 2015-03-15 NOTE — Patient Instructions (Signed)
[place] at [facility] Discharge Instructions  RECOMMENDATIONS MADE BY THE CONSULTANT AND ANY TEST RESULTS WILL BE SENT TO YOUR REFERRING PHYSICIAN.  Exam and discussion with Dr. Whitney Muse. Echocardiogram rescheduled. Chemotherapy and Herceptin as planned today. You may use over the counter hydrocortisone cream on scalp as needed.   Thank you for choosing [place] at [facility] to provide your oncology and hematology care.  To afford each patient quality time with our provider, please arrive at least 15 minutes before your scheduled appointment time.    You need to re-schedule your appointment should you arrive 10 or more minutes late.  We strive to give you quality time with our providers, and arriving late affects you and other patients whose appointments are after yours.  Also, if you no show three or more times for appointments you may be dismissed from the clinic at the providers discretion.     Again, thank you for choosing [facility].  Our hope is that these requests will decrease the amount of time that you wait before being seen by our physicians.       _____________________________________________________________  Should you have questions after your visit to [facility], please contact our office at [phone] between the hours of 8:30 a.m. and 4:30 p.m.  Voicemails left after 4:30 p.m. will not be returned until the following business day.  For prescription refill requests, have your pharmacy contact our office.

## 2015-03-15 NOTE — Patient Instructions (Addendum)
[facility] Discharge Instructions for Patients Receiving Chemotherapy  Today you received the following chemotherapy agents Herceptin and Abraxane. You also received Ferric Gluconate 125 mg iron infusion.  To help prevent nausea and vomiting after your treatment, we encourage you to take your nausea medication as instructed. If you develop nausea and vomiting that is not controlled by your nausea medication, call the clinic. If it is after clinic hours your family physician or the after hours number for the clinic or go to the Emergency Department. BELOW ARE SYMPTOMS THAT SHOULD BE REPORTED IMMEDIATELY:  *FEVER GREATER THAN 101.0 F  *CHILLS WITH OR WITHOUT FEVER  NAUSEA AND VOMITING THAT IS NOT CONTROLLED WITH YOUR NAUSEA MEDICATION  *UNUSUAL SHORTNESS OF BREATH  *UNUSUAL BRUISING OR BLEEDING  TENDERNESS IN MOUTH AND THROAT WITH OR WITHOUT PRESENCE OF ULCERS  *URINARY PROBLEMS  *BOWEL PROBLEMS  UNUSUAL RASH Items with * indicate a potential emergency and should be followed up as soon as possible.  Return as scheduled.  I have been informed and understand all the instructions given to me. I know to contact the clinic, my physician, or go to the Emergency Department if any problems should occur. I do not have any questions at this time, but understand that I may call the clinic during office hours or the Patient Navigator at [phone] should I have any questions or need assistance in obtaining follow up care.    __________________________________________  _____________  __________ Signature of Patient or Authorized Representative            Date                   Time    __________________________________________ Nurse's Signature

## 2015-03-20 ENCOUNTER — Ambulatory Visit (HOSPITAL_COMMUNITY)
Admission: RE | Admit: 2015-03-20 | Discharge: 2015-03-20 | Disposition: A | Discharge status: Home / Self Care | Payer: Medicare Other | Source: Ambulatory Visit | Attending: Oncology | Admitting: Oncology

## 2015-03-20 DIAGNOSIS — C50412 Malignant neoplasm of upper-outer quadrant of left female breast: Secondary | ICD-10-CM

## 2015-03-20 DIAGNOSIS — I10 Essential (primary) hypertension: Secondary | ICD-10-CM | POA: Diagnosis not present

## 2015-03-20 DIAGNOSIS — E785 Hyperlipidemia, unspecified: Secondary | ICD-10-CM | POA: Insufficient documentation

## 2015-03-20 DIAGNOSIS — E119 Type 2 diabetes mellitus without complications: Secondary | ICD-10-CM | POA: Insufficient documentation

## 2015-03-22 ENCOUNTER — Encounter (HOSPITAL_COMMUNITY): Payer: Medicare Other

## 2015-03-22 ENCOUNTER — Encounter (HOSPITAL_BASED_OUTPATIENT_CLINIC_OR_DEPARTMENT_OTHER): Payer: Medicare Other

## 2015-03-22 VITALS — BP 129/55 | HR 74 | Temp 98.4°F | Resp 18

## 2015-03-22 DIAGNOSIS — C50412 Malignant neoplasm of upper-outer quadrant of left female breast: Secondary | ICD-10-CM | POA: Diagnosis not present

## 2015-03-22 DIAGNOSIS — D509 Iron deficiency anemia, unspecified: Secondary | ICD-10-CM | POA: Diagnosis not present

## 2015-03-22 DIAGNOSIS — Z5112 Encounter for antineoplastic immunotherapy: Secondary | ICD-10-CM | POA: Diagnosis not present

## 2015-03-22 DIAGNOSIS — Z5111 Encounter for antineoplastic chemotherapy: Secondary | ICD-10-CM | POA: Diagnosis not present

## 2015-03-22 LAB — COMPREHENSIVE METABOLIC PANEL
ALT: 19 U/L (ref 14–54)
AST: 25 U/L (ref 15–41)
Albumin: 3.5 g/dL (ref 3.5–5.0)
Alkaline Phosphatase: 89 U/L (ref 38–126)
Anion gap: 7 (ref 5–15)
BUN: 7 mg/dL (ref 6–20)
CHLORIDE: 97 mmol/L — AB (ref 101–111)
CO2: 31 mmol/L (ref 22–32)
Calcium: 8.8 mg/dL — ABNORMAL LOW (ref 8.9–10.3)
Creatinine, Ser: 0.9 mg/dL (ref 0.44–1.00)
GFR calc Af Amer: >60 mL/min (ref >60)
Glucose, Bld: 134 mg/dL — ABNORMAL HIGH (ref 65–99)
POTASSIUM: 3.3 mmol/L — AB (ref 3.5–5.1)
Sodium: 135 mmol/L (ref 135–145)
Total Bilirubin: 0.7 mg/dL (ref 0.3–1.2)
Total Protein: 7.6 g/dL (ref 6.5–8.1)

## 2015-03-22 LAB — CBC WITH DIFFERENTIAL/PLATELET
Basophils Absolute: 0 10*3/uL (ref 0.0–0.1)
Basophils Relative: 1 %
EOS PCT: 1 %
Eosinophils Absolute: 0 10*3/uL (ref 0.0–0.7)
HCT: 32.4 % — ABNORMAL LOW (ref 36.0–46.0)
Hemoglobin: 10.1 g/dL — ABNORMAL LOW (ref 12.0–15.0)
LYMPHS ABS: 0.9 10*3/uL (ref 0.7–4.0)
LYMPHS PCT: 23 %
MCH: 26.6 pg (ref 26.0–34.0)
MCHC: 31.2 g/dL (ref 30.0–36.0)
MCV: 85.5 fL (ref 78.0–100.0)
MONO ABS: 0.2 10*3/uL (ref 0.1–1.0)
Monocytes Relative: 5 %
Neutro Abs: 3 10*3/uL (ref 1.7–7.7)
Neutrophils Relative %: 71 %
PLATELETS: 276 10*3/uL (ref 150–400)
RBC: 3.79 MIL/uL — ABNORMAL LOW (ref 3.87–5.11)
RDW: 18 % — AB (ref 11.5–15.5)
WBC: 4.2 10*3/uL (ref 4.0–10.5)

## 2015-03-22 MED ORDER — DIPHENHYDRAMINE HCL 25 MG PO CAPS
50.0000 mg | ORAL_CAPSULE | Freq: Once | ORAL | Status: AC
  Administered 2015-03-22: 50 mg via ORAL
  Filled 2015-03-22: qty 2

## 2015-03-22 MED ORDER — PALONOSETRON HCL INJECTION 0.25 MG/5ML
0.2500 mg | Freq: Once | INTRAVENOUS | Status: AC
  Administered 2015-03-22: 0.25 mg via INTRAVENOUS
  Filled 2015-03-22: qty 5

## 2015-03-22 MED ORDER — POTASSIUM CHLORIDE CRYS ER 20 MEQ PO TBCR: 20.0000 meq | EXTENDED_RELEASE_TABLET | Freq: Every day | ORAL | Status: DC

## 2015-03-22 MED ORDER — PACLITAXEL PROTEIN-BOUND CHEMO INJECTION 100 MG
100.0000 mg/m2 | Freq: Once | INTRAVENOUS | Status: AC
  Administered 2015-03-22: 250 mg via INTRAVENOUS
  Filled 2015-03-22: qty 50

## 2015-03-22 MED ORDER — SODIUM CHLORIDE 0.9 % IJ SOLN
10.0000 mL | INTRAMUSCULAR | Status: DC | PRN
  Administered 2015-03-22: 10 mL
  Filled 2015-03-22: qty 10

## 2015-03-22 MED ORDER — TRASTUZUMAB CHEMO INJECTION 440 MG
2.0000 mg/kg | Freq: Once | INTRAVENOUS | Status: AC
  Administered 2015-03-22: 273 mg via INTRAVENOUS
  Filled 2015-03-22: qty 13

## 2015-03-22 MED ORDER — ACETAMINOPHEN 325 MG PO TABS
650.0000 mg | ORAL_TABLET | Freq: Once | ORAL | Status: AC
  Administered 2015-03-22: 650 mg via ORAL
  Filled 2015-03-22: qty 2

## 2015-03-22 MED ORDER — SODIUM CHLORIDE 0.9 % IV SOLN
125.0000 mg | Freq: Once | INTRAVENOUS | Status: AC
  Administered 2015-03-22: 125 mg via INTRAVENOUS
  Filled 2015-03-22: qty 10

## 2015-03-22 MED ORDER — SODIUM CHLORIDE 0.9 % IV SOLN
Freq: Once | INTRAVENOUS | Status: AC
  Administered 2015-03-22: 10:00:00 via INTRAVENOUS

## 2015-03-22 MED ORDER — HEPARIN SOD (PORK) LOCK FLUSH 100 UNIT/ML IV SOLN
500.0000 [IU] | Freq: Once | INTRAVENOUS | Status: AC | PRN
  Administered 2015-03-22: 500 [IU]
  Filled 2015-03-22: qty 5

## 2015-03-22 NOTE — Patient Instructions (Signed)
[facility] Discharge Instructions for Patients Receiving Chemotherapy   Beginning January 23rd 2017 lab work for the [facility] will be done in the  Main lab at [facility] on 1st floor. If you have a lab appointment with the [facility] please come in thru the  Main Entrance and check in at the main information desk   Today you received the following chemotherapy agents: Herceptin and Abraxane.     If you develop nausea and vomiting, or diarrhea that is not controlled by your medication, call the clinic.  The clinic phone number is [phone]. Office hours are Monday-Friday 8:30am-5:00pm.  BELOW ARE SYMPTOMS THAT SHOULD BE REPORTED IMMEDIATELY:  *FEVER GREATER THAN 101.0 F  *CHILLS WITH OR WITHOUT FEVER  NAUSEA AND VOMITING THAT IS NOT CONTROLLED WITH YOUR NAUSEA MEDICATION  *UNUSUAL SHORTNESS OF BREATH  *UNUSUAL BRUISING OR BLEEDING  TENDERNESS IN MOUTH AND THROAT WITH OR WITHOUT PRESENCE OF ULCERS  *URINARY PROBLEMS  *BOWEL PROBLEMS  UNUSUAL RASH Items with * indicate a potential emergency and should be followed up as soon as possible. If you have an emergency after office hours please contact your primary care physician or go to the nearest emergency department.  Please call the clinic during office hours if you have any questions or concerns.   You may also contact the Patient Navigator at [phone] should you have any questions or need assistance in obtaining follow up care.

## 2015-03-22 NOTE — Progress Notes (Signed)
See other encounter for the day for documentation.

## 2015-03-22 NOTE — Progress Notes (Signed)
Potassium level reported to MD.  Dr. Theoden Mauch Muse ordered Potassium 71meq daily.  Prescription sent to [facility] per patient request.  Patient aware of medication as well as lab results for today.  Patient tolerated infusion well.  VSS.

## 2015-03-23 ENCOUNTER — Other Ambulatory Visit (HOSPITAL_COMMUNITY): Payer: Self-pay | Admitting: Emergency Medicine

## 2015-03-23 DIAGNOSIS — C50412 Malignant neoplasm of upper-outer quadrant of left female breast: Secondary | ICD-10-CM

## 2015-03-23 MED ORDER — ONDANSETRON HCL 8 MG PO TABS: 8.0000 mg | ORAL_TABLET | Freq: Three times a day (TID) | ORAL | Status: DC | PRN

## 2015-03-29 ENCOUNTER — Encounter (HOSPITAL_BASED_OUTPATIENT_CLINIC_OR_DEPARTMENT_OTHER): Payer: Medicare Other | Admitting: Hematology & Oncology

## 2015-03-29 ENCOUNTER — Encounter (HOSPITAL_COMMUNITY): Payer: Self-pay | Admitting: Lab

## 2015-03-29 ENCOUNTER — Encounter (HOSPITAL_BASED_OUTPATIENT_CLINIC_OR_DEPARTMENT_OTHER): Payer: Medicare Other

## 2015-03-29 ENCOUNTER — Other Ambulatory Visit (HOSPITAL_COMMUNITY): Payer: Self-pay | Admitting: *Deleted

## 2015-03-29 ENCOUNTER — Inpatient Hospital Stay (HOSPITAL_COMMUNITY): Payer: Medicare Other

## 2015-03-29 ENCOUNTER — Encounter (HOSPITAL_COMMUNITY): Payer: Self-pay | Admitting: Hematology & Oncology

## 2015-03-29 VITALS — BP 132/54 | HR 75 | Temp 97.5°F | Resp 18

## 2015-03-29 VITALS — BP 150/69 | HR 89 | Temp 97.7°F | Resp 18 | Wt 281.6 lb

## 2015-03-29 DIAGNOSIS — E611 Iron deficiency: Secondary | ICD-10-CM | POA: Diagnosis not present

## 2015-03-29 DIAGNOSIS — Z1379 Encounter for other screening for genetic and chromosomal anomalies: Secondary | ICD-10-CM

## 2015-03-29 DIAGNOSIS — D509 Iron deficiency anemia, unspecified: Secondary | ICD-10-CM

## 2015-03-29 DIAGNOSIS — D6481 Anemia due to antineoplastic chemotherapy: Secondary | ICD-10-CM | POA: Diagnosis not present

## 2015-03-29 DIAGNOSIS — Z5111 Encounter for antineoplastic chemotherapy: Secondary | ICD-10-CM | POA: Diagnosis not present

## 2015-03-29 DIAGNOSIS — E669 Obesity, unspecified: Secondary | ICD-10-CM | POA: Diagnosis not present

## 2015-03-29 DIAGNOSIS — E119 Type 2 diabetes mellitus without complications: Secondary | ICD-10-CM | POA: Diagnosis not present

## 2015-03-29 DIAGNOSIS — T451X5A Adverse effect of antineoplastic and immunosuppressive drugs, initial encounter: Secondary | ICD-10-CM

## 2015-03-29 DIAGNOSIS — C50412 Malignant neoplasm of upper-outer quadrant of left female breast: Secondary | ICD-10-CM

## 2015-03-29 DIAGNOSIS — Z5112 Encounter for antineoplastic immunotherapy: Secondary | ICD-10-CM | POA: Diagnosis present

## 2015-03-29 DIAGNOSIS — G620 Drug-induced polyneuropathy: Secondary | ICD-10-CM

## 2015-03-29 LAB — COMPREHENSIVE METABOLIC PANEL
ALBUMIN: 3.4 g/dL — AB (ref 3.5–5.0)
ALK PHOS: 91 U/L (ref 38–126)
ALT: 23 U/L (ref 14–54)
AST: 24 U/L (ref 15–41)
Anion gap: 10 (ref 5–15)
BILIRUBIN TOTAL: 0.7 mg/dL (ref 0.3–1.2)
BUN: 10 mg/dL (ref 6–20)
CALCIUM: 9 mg/dL (ref 8.9–10.3)
CO2: 29 mmol/L (ref 22–32)
Chloride: 99 mmol/L — ABNORMAL LOW (ref 101–111)
Creatinine, Ser: 0.91 mg/dL (ref 0.44–1.00)
GFR calc Af Amer: >60 mL/min (ref >60)
GFR calc non Af Amer: >60 mL/min (ref >60)
GLUCOSE: 120 mg/dL — AB (ref 65–99)
Potassium: 3.4 mmol/L — ABNORMAL LOW (ref 3.5–5.1)
Sodium: 138 mmol/L (ref 135–145)
TOTAL PROTEIN: 7.5 g/dL (ref 6.5–8.1)

## 2015-03-29 LAB — CBC WITH DIFFERENTIAL/PLATELET
BASOS ABS: 0 10*3/uL (ref 0.0–0.1)
BASOS PCT: 0 %
Eosinophils Absolute: 0.1 10*3/uL (ref 0.0–0.7)
Eosinophils Relative: 2 %
HCT: 32.7 % — ABNORMAL LOW (ref 36.0–46.0)
HEMOGLOBIN: 10 g/dL — AB (ref 12.0–15.0)
Lymphocytes Relative: 34 %
Lymphs Abs: 1.9 10*3/uL (ref 0.7–4.0)
MCH: 26.3 pg (ref 26.0–34.0)
MCHC: 30.6 g/dL (ref 30.0–36.0)
MCV: 86.1 fL (ref 78.0–100.0)
MONOS PCT: 4 %
Monocytes Absolute: 0.2 10*3/uL (ref 0.1–1.0)
NEUTROS ABS: 3.4 10*3/uL (ref 1.7–7.7)
NEUTROS PCT: 60 %
Platelets: 319 10*3/uL (ref 150–400)
RBC: 3.8 MIL/uL — AB (ref 3.87–5.11)
RDW: 18.2 % — ABNORMAL HIGH (ref 11.5–15.5)
WBC: 5.7 10*3/uL (ref 4.0–10.5)

## 2015-03-29 MED ORDER — SODIUM CHLORIDE 0.9 % IV SOLN
Freq: Once | INTRAVENOUS | Status: AC
  Administered 2015-03-29: 10:00:00 via INTRAVENOUS

## 2015-03-29 MED ORDER — SODIUM CHLORIDE 0.9 % IJ SOLN
10.0000 mL | INTRAMUSCULAR | Status: DC | PRN
  Administered 2015-03-29: 10 mL
  Filled 2015-03-29: qty 10

## 2015-03-29 MED ORDER — PALONOSETRON HCL INJECTION 0.25 MG/5ML
0.2500 mg | Freq: Once | INTRAVENOUS | Status: AC
  Administered 2015-03-29: 0.25 mg via INTRAVENOUS
  Filled 2015-03-29: qty 5

## 2015-03-29 MED ORDER — HEPARIN SOD (PORK) LOCK FLUSH 100 UNIT/ML IV SOLN
INTRAVENOUS | Status: AC
  Filled 2015-03-29: qty 5

## 2015-03-29 MED ORDER — DIPHENHYDRAMINE HCL 25 MG PO CAPS
50.0000 mg | ORAL_CAPSULE | Freq: Once | ORAL | Status: AC
  Administered 2015-03-29: 50 mg via ORAL
  Filled 2015-03-29: qty 2

## 2015-03-29 MED ORDER — PACLITAXEL PROTEIN-BOUND CHEMO INJECTION 100 MG
90.0000 mg/m2 | Freq: Once | INTRAVENOUS | Status: AC
  Administered 2015-03-29: 225 mg via INTRAVENOUS
  Filled 2015-03-29: qty 45

## 2015-03-29 MED ORDER — ACETAMINOPHEN 325 MG PO TABS
650.0000 mg | ORAL_TABLET | Freq: Once | ORAL | Status: AC
  Administered 2015-03-29: 650 mg via ORAL
  Filled 2015-03-29: qty 2

## 2015-03-29 MED ORDER — HEPARIN SOD (PORK) LOCK FLUSH 100 UNIT/ML IV SOLN
500.0000 [IU] | Freq: Once | INTRAVENOUS | Status: AC | PRN
  Administered 2015-03-29: 500 [IU]

## 2015-03-29 MED ORDER — TRASTUZUMAB CHEMO INJECTION 440 MG
2.0000 mg/kg | Freq: Once | INTRAVENOUS | Status: AC
  Administered 2015-03-29: 273 mg via INTRAVENOUS
  Filled 2015-03-29: qty 13

## 2015-03-29 NOTE — Progress Notes (Signed)
Referral sent to [facility].  Records faxed on 1/26.  They will contact patient.

## 2015-03-29 NOTE — Progress Notes (Signed)
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 female is here for follow-up of her ER+, PR+ HER 2 neu + breast cancer. She is doing extremely well with therapy. She is on weekly abraxane and herceptin.   Her is mood is good. She is sleeping well. States that she gets a taste for things but then it does not taste the same. Denies breathing issues.   Denies dysuria. Reports that the zofran resolves her nausea "pretty good" and is unsure if she has a refill for it.  She reports some discomfort under the R axillae, no breast pain. No lymphedema.  Because of her decreased appetite from treatment she has been drinking protein shakes daily.   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     Breast cancer of upper-outer quadrant of left female breast ([place])   11/01/2014 Mammogram Possible mass in the left breast upper outer quadrant measuring 13 mm suspicious for breast cancer confirmed through ultrasound a spiculated hypoechoic mass ill-defined, no enlarged lymph nodes   11/01/2014 Initial Diagnosis Invasive ductal carcinoma, moderately differentiated, ER > 90%,  PR> 90%, HER-2 -2+ by IHC, ratio 1.15, KI 67: 29%, T1 cN0 stage IA clinical stage   11/24/2014 Echocardiogram Systolic function was normal. The estimated ejection fraction was in the range of 55% to 60%.    12/25/2014 Surgery Left lumpectomy: IDC 1.7 cm, positive for LVI, with DCIS, 1/1 sentinel node positive deposit 1.9 cm with extracapsular extension, ER 90%, PR 90%, HER-2 positive ratio 2.4, Ki-67 29% T1 cN1 stage II a   02/01/2015 -  Chemotherapy Abraxane/Herceptin     MEDICAL HISTORY:  Past Medical History  Diagnosis Date  . Diabetes mellitus   . Morbid obesity ([place])   . Recurrent boils     of perineal and buttocks  . Hypertension   . Hyperlipidemia   . COPD (chronic obstructive pulmonary disease) ([place])   . Enlarged heart   . Anemia   . Arthritis   . Heart murmur   . Urinary frequency   . Sleep apnea     no cpap used since weight loss  . H. pylori infection   . Diverticulosis april 2016  . Hidradenitis suppurativa   . PONV (postoperative nausea and vomiting)   . Aortic stenosis     mild AS 11/24/14 echo  . Shortness of breath dyspnea     with exertion  . Pneumonia years ago  . GERD (gastroesophageal reflux disease)   . Breast cancer ([place]) 11/01/14    left breast     SURGICAL HISTORY: Past Surgical History  Procedure Laterality Date  . Pilonidal cyst excision    . Bladder suspension      x 2  . Abcess drainage  on bottom  . Cardiac catheterization  02/21/2011    Normal coronary arteries, normal EF  . Tonsillectomy  yrs ago  . Irrigation and debridement abscess Right 12/23/2012    Procedure: incision  AND DEBRIDEMENT right buttock infection ;  Surgeon: Shann Medal, MD;  Location: [facility];  Service: General;  Laterality: Right;  . Hydradenitis excision Left 12/08/2013    Procedure: EXCISION HIDRADENITIS AXILLA;  Surgeon: Alphonsa Overall, MD;  Location: [facility];  Service: General;  Laterality: Left;  . Excision hydradenitis labia N/A 12/08/2013    Procedure:  EXCISION HIDRADENITIS PUBIC AREA;  Surgeon: Alphonsa Overall, MD;  Location: [facility];  Service: General;  Laterality: N/A;  . Breast lumpectomy with radioactive seed and sentinel lymph node biopsy Left 12/25/2014    Procedure: RADIOACTIVE SEED GIUDED LEFT BREAST LUMPECTOMY, LEFT AXILLARY SENTINEL LYMPH NODE BIOPSY;  Surgeon: Alphonsa Overall, MD;  Location: [place];  Service: General;  Laterality: Left;  . Abdominal hysterectomy  25 yrs ago    partial  . Portacath placement N/A 01/12/2015    Procedure: INSERTION PORT-A-CATH;  Surgeon: Alphonsa Overall, MD;  Location: [facility];  Service: General;  Laterality: N/A;    SOCIAL HISTORY: Social History   Social History  . Marital Status: Widowed    Spouse Name: N/A  . Number of Children: 5  . Years of Education: N/A   Occupational History  . Textile work     Disabled   Social History Main Topics  . Smoking status: Former Smoker -- 1.00 packs/day for 10 years    Types: Cigarettes    Quit date: 03/03/1994  . Smokeless tobacco: Never Used  . Alcohol Use: No  . Drug Use: No  . Sexual Activity: Not on file   Other Topics Concern  . Not on file   Social History Narrative   Lives with grandson.  Widowed.  Has 2 sons and 3 daughters  Widowed 28 years 6 children, oldest is 63 yo 8 grandchildren 1 great grandchild Worked in a factory She used to enjoy fishing and play softball. Ex smoker, quit 20 years ago. ETOH, none  FAMILY HISTORY: Family History  Problem Relation Age of Onset  . Heart attack Mother     had multiple health problems  . Lung cancer Father 34    smoker  . Prostate cancer Brother   . Lung cancer Brother     dx. 64s; smoker  . Throat cancer Brother     dx. 18s; smoker  . Diabetes Sister     has 3 living sisters with multiple health problems  . Hypertension Brother   . Hypertension Son   . Hypertension Daughter   . Breast cancer Sister 97  . Breast cancer Sister     dx. 49s  . Breast cancer Maternal Grandmother     dx.  21s  . Breast cancer Maternal Aunt     dx. older than 10  . Dementia Maternal Aunt   . Breast cancer Cousin     dx. 44s   indicated that her mother is deceased. She indicated that her father is deceased. She indicated that two of her four sisters are alive. She indicated that all of her three brothers are deceased. She indicated that her maternal grandmother is deceased. She indicated that her maternal grandfather is deceased. She indicated that her paternal grandmother is deceased. She indicated that her paternal grandfather is deceased. She indicated that her daughter is alive. She indicated that only one of  her two sons is alive. She indicated that both of her maternal aunts are deceased. She indicated that her maternal uncle is deceased. She indicated that her paternal aunt is deceased. She indicated that her cousin is alive.  Mother died at 61 of a heart attack, she had a large heart and a hole in her heart. Non-smoker Father died at 53 of lung cancer, he was a smoker. 9 siblings, 4 living. One sister with breast cancer bilaterally, she is still living Another sister with breast cancer  ALLERGIES:  is allergic to lisinopril and penicillins.  MEDICATIONS:  Current Outpatient Prescriptions  Medication Sig Dispense Refill  . acetaminophen (TYLENOL) 500 MG tablet Take 1,000 mg by mouth every 6 (six) hours as needed for mild pain or headache.    . albuterol (PROVENTIL HFA;VENTOLIN HFA) 108 (90 BASE) MCG/ACT inhaler Inhale 2 puffs into the lungs every 4 (four) hours as needed for shortness of breath.    Marland Kitchen atorvastatin (LIPITOR) 20 MG tablet Take 20 mg by mouth every morning.     . bismuth subsalicylate (PEPTO BISMOL) 262 MG chewable tablet Chew 2 tablets (524 mg total) by mouth 2 (two) times daily. (Patient taking differently: Chew 524 mg by mouth 2 (two) times daily as needed for indigestion (acid reflux). ) 22 tablet 0  . Cholecalciferol (VITAMIN D-3) 5000 UNITS TABS Take 5,000 Units by mouth  every morning.    Marland Kitchen doxycycline (VIBRA-TABS) 100 MG tablet Take 1 tablet (100 mg total) by mouth 2 (two) times daily. 14 tablet 0  . ferrous sulfate 325 (65 FE) MG tablet Take 1 tablet (325 mg total) by mouth every morning. (Patient taking differently: Take 325 mg by mouth 3 (three) times daily with meals. ) 30 tablet 0  . furosemide (LASIX) 20 MG tablet TAKE ONE TABLET BY MOUTH ONCE DAILY.    Marland Kitchen glipiZIDE (GLUCOTROL XL) 10 MG 24 hr tablet Take 10 mg by mouth daily with breakfast.     . HYDROcodone-acetaminophen (NORCO/VICODIN) 5-325 MG per tablet Take 1 tablet by mouth 2 (two) times daily as needed for moderate pain.     Marland Kitchen ketoconazole (NIZORAL) 2 % cream Reported on 03/01/2015  0  . lidocaine-prilocaine (EMLA) cream Apply a quarter size amount to port site 1 hour prior to chemo. Do not rub in. Cover with plastic wrap. 30 g 3  . losartan (COZAAR) 100 MG tablet Take 100 mg by mouth daily.    . metFORMIN (GLUCOPHAGE-XR) 500 MG 24 hr tablet Take 500 mg by mouth 2 (two) times daily.     . metoprolol (TOPROL-XL) 100 MG 24 hr tablet Take 100 mg by mouth every morning.     . ondansetron (ZOFRAN) 8 MG tablet Take 1 tablet (8 mg total) by mouth every 8 (eight) hours as needed for nausea or vomiting. 30 tablet 3  . PACLitaxel Protein-Bound Part (ABRAXANE IV) Inject into the vein. To be given weekly    . pantoprazole (PROTONIX) 40 MG tablet TAKE 1 TABLET (40 MG TOTAL) BY MOUTH DAILY. 90 tablet 0  . potassium chloride SA (K-DUR,KLOR-CON) 20 MEQ tablet Take 1 tablet (20 mEq total) by mouth daily. 30 tablet 2  . sulfamethoxazole-trimethoprim (BACTRIM,SEPTRA) 400-80 MG per tablet Reported on 03/01/2015    . Trastuzumab (HERCEPTIN IV) Inject into the vein. To be given weekly    . verapamil (CALAN-SR) 120 MG CR tablet Take 120 mg by mouth daily.    . polyethylene glycol (MIRALAX / GLYCOLAX) packet Take 17 g by  mouth daily as needed for mild constipation. (Patient not taking: Reported on 03/29/2015) 14 each 0  .  prochlorperazine (COMPAZINE) 10 MG tablet Take 1 tablet (10 mg total) by mouth every 6 (six) hours as needed for nausea or vomiting. (Patient not taking: Reported on 03/29/2015) 30 tablet 2   No current facility-administered medications for this visit.    Review of Systems  Constitutional: Negative.  Negative for fever.       Taste change.  HENT: Negative.   Eyes: Negative.   Respiratory: Negative.  Negative for shortness of breath.   Cardiovascular: Negative.   Gastrointestinal: Positive for diarrhea. Negative for heartburn, nausea and blood in stool.       Nausea managed with zofran.   Genitourinary: Negative for dysuria.  Musculoskeletal: Negative.   Skin: Negative.   Neurological: Positive for tingling.       Neuropathy in fingers  Endo/Heme/Allergies: Negative.   Psychiatric/Behavioral: Negative.   All other systems reviewed and are negative.  14 point ROS was done and is otherwise as detailed above or in HPI   PHYSICAL EXAMINATION: ECOG PERFORMANCE STATUS: 2 - Symptomatic, <50% confined to bed  Filed Vitals:   03/29/15 0833  BP: 150/69  Pulse: 89  Temp: 97.7 F (36.5 C)  Resp: 18   Filed Weights   03/29/15 0833  Weight: 281 lb 9.6 oz (127.733 kg)    Physical Exam  Constitutional: She is oriented to person, place, and time and well-developed, well-nourished, and in no distress.  Obese, wearing head scarf  HENT:  Head: Normocephalic and atraumatic.  Nose: Nose normal.  Mouth/Throat: Oropharynx is clear and moist. No oropharyngeal exudate.  Eyes: Conjunctivae and EOM are normal. Pupils are equal, round, and reactive to light. Right eye exhibits no discharge. Left eye exhibits no discharge. No scleral icterus.  Neck: Normal range of motion. Neck supple. No tracheal deviation present. No thyromegaly present.  Cardiovascular: Normal rate and regular rhythm.  Exam reveals no gallop and no friction rub.   Murmur heard. Pulmonary/Chest: Effort normal and breath  sounds normal. She has no wheezes. She has no rales.  Abdominal: Soft. Bowel sounds are normal. She exhibits no distension and no mass. There is no tenderness. There is no rebound and no guarding.  Obese  Musculoskeletal: Normal range of motion. She exhibits no edema.  Lymphadenopathy:    She has no cervical adenopathy.  Neurological: She is alert and oriented to person, place, and time. No cranial nerve deficit. Coordination normal.  Skin: Skin is warm and dry. No rash noted.  Psychiatric: Mood, memory, affect and judgment normal.  Nursing note and vitals reviewed.   LABORATORY DATA:  I have reviewed the data as listed Lab Results  Component Value Date   WBC 4.2 03/22/2015   HGB 10.1* 03/22/2015   HCT 32.4* 03/22/2015   MCV 85.5 03/22/2015   PLT 276 03/22/2015   CMP     Component Value Date/Time   NA 135 03/22/2015 1020   K 3.3* 03/22/2015 1020   CL 97* 03/22/2015 1020   CO2 31 03/22/2015 1020   GLUCOSE 134* 03/22/2015 1020   BUN 7 03/22/2015 1020   CREATININE 0.90 03/22/2015 1020   CALCIUM 8.8* 03/22/2015 1020   PROT 7.6 03/22/2015 1020   ALBUMIN 3.5 03/22/2015 1020   AST 25 03/22/2015 1020   ALT 19 03/22/2015 1020   ALKPHOS 89 03/22/2015 1020   BILITOT 0.7 03/22/2015 1020   GFRNONAA >60 03/22/2015 1020   GFRAA >60  03/22/2015 1020   RADIOLOGY: I have personally reviewed the radiological images as listed and agreed with the findings in the report.  CLINICAL DATA: Status post port placement  EXAM: PORTABLE CHEST - 1 VIEW  COMPARISON: 12/08/2013  FINDINGS: Cardiac shadow remains enlarged. A new right-sided chest wall port is noted with the catheter tip at the cavoatrial junction. Downward depression of the catheter is noted at the first costoclavicular space. Mild atelectasis is noted bilaterally due to a poor inspiratory effort. Some vascular crowding is noted as well. No pneumothorax is seen.  IMPRESSION: No evidence of pneumothorax.  Poor  inspiratory effort with bilateral atelectatic changes.  Downward depression of the catheter as described. This may predispose the catheter to potential pinch off syndrome.   Electronically Signed  By: Inez Catalina M.D.  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 She is cleared for treatment today. ECHO reviewed.  I have dose reduced her abraxane by 10%. She has underlying diabetes. Her neuropathy is becoming a concern to her. Her neuropathy is not interfering with ADL's.   The patient would prefer to receive radiation treatment in [place]. She has previously spoken with Dr. Lisbeth Renshaw. Will refer to [facility] today.  She will return to see me in 1 week for reassessment of neuropathy.   All questions were answered. The patient knows to call the clinic with any problems, questions or concerns.  This note was electronically signed.   Molli Hazard, MD  03/29/2015 9:01 AM

## 2015-03-29 NOTE — Progress Notes (Signed)
Tolerated chemo well. Stable on discharge home with daughter via wheelchair. 

## 2015-03-29 NOTE — Patient Instructions (Signed)
[place] at [facility] Discharge Instructions  RECOMMENDATIONS MADE BY THE CONSULTANT AND ANY TEST RESULTS WILL BE SENT TO YOUR REFERRING PHYSICIAN.   Exam and discussion completed by Dr Whitney Muse today We are going to dose reduce your abraxane. We are going to continue to monitor your neuropathy, we will see you back next week to see the doctor. Referral to [facility], Dr Lisbeth Renshaw, they will call you with this appt You can add a protein drink, you can try this once a day After you finish your chemotherapy you will continue your herceptin every 3 weeks. Return to see the doctor if you have any questions or concerns   Thank you for choosing [place] at [facility] to provide your oncology and hematology care.  To afford each patient quality time with our provider, please arrive at least 15 minutes before your scheduled appointment time.   Beginning January 23rd 2017 lab work for the [facility] will be done in the  Main lab at [facility] on 1st floor. If you have a lab appointment with the [facility] please come in thru the  Main Entrance and check in at the main information desk  You need to re-schedule your appointment should you arrive 10 or more minutes late.  We strive to give you quality time with our providers, and arriving late affects you and other patients whose appointments are after yours.  Also, if you no show three or more times for appointments you may be dismissed from the clinic at the providers discretion.     Again, thank you for choosing [facility].  Our hope is that these requests will decrease the amount of time that you wait before being seen by our physicians.       _____________________________________________________________  Should you have questions after your visit to [facility], please contact our office at [phone] between the hours of 8:30 a.m. and 4:30 p.m.   Voicemails left after 4:30 p.m. will not be returned until the following business day.  For prescription refill requests, have your pharmacy contact our office.

## 2015-03-29 NOTE — Patient Instructions (Signed)
[facility] Discharge Instructions for Patients Receiving Chemotherapy  Today you received the following chemotherapy agents Abraxane and Herceptin.  To help prevent nausea and vomiting after your treatment, we encourage you to take your nausea medication as instructed. If you develop nausea and vomiting that is not controlled by your nausea medication, call the clinic. If it is after clinic hours your family physician or the after hours number for the clinic or go to the Emergency Department. BELOW ARE SYMPTOMS THAT SHOULD BE REPORTED IMMEDIATELY:  *FEVER GREATER THAN 101.0 F  *CHILLS WITH OR WITHOUT FEVER  NAUSEA AND VOMITING THAT IS NOT CONTROLLED WITH YOUR NAUSEA MEDICATION  *UNUSUAL SHORTNESS OF BREATH  *UNUSUAL BRUISING OR BLEEDING  TENDERNESS IN MOUTH AND THROAT WITH OR WITHOUT PRESENCE OF ULCERS  *URINARY PROBLEMS  *BOWEL PROBLEMS  UNUSUAL RASH Items with * indicate a potential emergency and should be followed up as soon as possible.  Return as scheduled.  I have been informed and understand all the instructions given to me. I know to contact the clinic, my physician, or go to the Emergency Department if any problems should occur. I do not have any questions at this time, but understand that I may call the clinic during office hours or the Patient Navigator at [phone] should I have any questions or need assistance in obtaining follow up care.    __________________________________________  _____________  __________ Signature of Patient or Authorized Representative            Date                   Time    __________________________________________ Nurse's Signature

## 2015-03-30 ENCOUNTER — Encounter (HOSPITAL_COMMUNITY): Payer: Self-pay

## 2015-04-04 NOTE — Progress Notes (Addendum)
[name], MD [place] [address]  Breast cancer of upper-outer quadrant of left female breast (Kristy Harris) - Plan: potassium chloride SA (K-DUR,KLOR-CON) 20 MEQ tablet  Iron deficiency anemia - Plan: potassium chloride SA (K-DUR,KLOR-CON) 20 MEQ tablet  CURRENT THERAPY: Adjuvant Abraxane/Herceptin  INTERVAL HISTORY: Kristy Harris 70 y.o. 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    Breast cancer of upper-outer quadrant of left female breast (Kristy Harris)   11/01/2014 Mammogram Possible mass in the left breast upper outer quadrant measuring 13 mm suspicious for breast cancer confirmed through ultrasound a spiculated hypoechoic mass ill-defined, no enlarged lymph nodes   11/01/2014 Initial Diagnosis Invasive ductal carcinoma, moderately differentiated, ER > 90%, PR> 90%, HER-2 -2+ by IHC, ratio 1.15, KI 67: 29%, T1 cN0 stage IA clinical stage   11/24/2014 Echocardiogram Systolic function was normal. The estimated ejection fraction was in the range of 55% to 60%.    12/25/2014 Surgery Left lumpectomy: IDC 1.7 cm, positive for LVI, with DCIS, 1/1 sentinel node positive deposit 1.9 cm with extracapsular extension, ER 90%, PR 90%, HER-2 positive ratio 2.4, Ki-67 29% T1 cN1 stage II a   02/01/2015 -  Chemotherapy Abraxane/Herceptin   03/20/2015 Echocardiogram Systolic function wasnormal. The estimated ejection fraction was in the range of 60%to 65%.    I personally reviewed and went over laboratory results with the patient.  The results are noted within this dictation.  Labs are pending at this current time.  I personally reviewed and went over radiographic studies with the patient.  The results are noted within this dictation.  2D ECHO on 03/20/2015 was stable.   She continues to tolerate treatment well.  She notes that her scalp rash is stable and  maybe improved.  Does not appear to have an infectious component at this time.  Will follow along.    She denies any nausea/vomiting, and PN.     Past Medical History  Diagnosis Date  . Diabetes mellitus   . Morbid obesity ([place])   . Recurrent boils     of perineal and buttocks  . Hypertension   . Hyperlipidemia   . COPD (chronic obstructive pulmonary disease) ([place])   . Enlarged heart   . Anemia   . Arthritis   . Heart murmur   . Urinary frequency   . Sleep apnea     no cpap used since weight loss  . H. pylori infection   . Diverticulosis april 2016  . Hidradenitis suppurativa   . PONV (postoperative nausea and vomiting)   . Aortic stenosis     mild AS 11/24/14 echo  . Shortness of breath dyspnea     with exertion  . Pneumonia years ago  . GERD (gastroesophageal reflux disease)   . Breast cancer ([place]) 11/01/14    left breast     has Dyspnea; Hypertension; Hyperlipidemia; Obesity; Bruit; Murmur; Abnormal cardiovascular function study; Peripheral vascular disease (Kristy Harris); Diabetes mellitus (Kristy Harris); Arteritis (Kristy Harris); Abscess of buttock, right; Abscess of left thigh; Abscess of left axilla; Iron deficiency anemia; Abscess of skin and subcutaneous tissue; H. pylori infection; Diverticulosis of colon with hemorrhage; Breast cancer of upper-outer quadrant of left female breast (Kristy Harris); Encounter for pre-operative cardiovascular clearance; Aortic stenosis; Normal coronary arteries; Morbid obesity (Kristy Harris); DJD (degenerative joint disease); Family history of breast cancer; and Genetic testing on her problem list.  is allergic to lisinopril and penicillins.  Current Outpatient Prescriptions on File Prior to Visit  Medication Sig Dispense Refill  . acetaminophen (TYLENOL) 500 MG tablet Take 1,000 mg by mouth every 6 (six) hours as needed for mild pain or headache.    . albuterol (PROVENTIL HFA;VENTOLIN HFA) 108 (90 BASE) MCG/ACT inhaler Inhale 2 puffs into the lungs every 4 (four) hours as  needed for shortness of breath.    Marland Kitchen atorvastatin (LIPITOR) 20 MG tablet Take 20 mg by mouth every morning.     . bismuth subsalicylate (PEPTO BISMOL) 262 MG chewable tablet Chew 2 tablets (524 mg total) by mouth 2 (two) times daily. (Patient taking differently: Chew 524 mg by mouth 2 (two) times daily as needed for indigestion (acid reflux). ) 22 tablet 0  . Cholecalciferol (VITAMIN D-3) 5000 UNITS TABS Take 5,000 Units by mouth every morning.    Marland Kitchen doxycycline (VIBRA-TABS) 100 MG tablet Take 1 tablet (100 mg total) by mouth 2 (two) times daily. 14 tablet 0  . ferrous sulfate 325 (65 FE) MG tablet Take 1 tablet (325 mg total) by mouth every morning. (Patient taking differently: Take 325 mg by mouth 3 (three) times daily with meals. ) 30 tablet 0  . furosemide (LASIX) 20 MG tablet TAKE ONE TABLET BY MOUTH ONCE DAILY.    Marland Kitchen glipiZIDE (GLUCOTROL XL) 10 MG 24 hr tablet Take 10 mg by mouth daily with breakfast.     . HYDROcodone-acetaminophen (NORCO/VICODIN) 5-325 MG per tablet Take 1 tablet by mouth 2 (two) times daily as needed for moderate pain.     Marland Kitchen ketoconazole (NIZORAL) 2 % cream Reported on 03/01/2015  0  . lidocaine-prilocaine (EMLA) cream Apply a quarter size amount to port site 1 hour prior to chemo. Do not rub in. Cover with plastic wrap. 30 g 3  . losartan (COZAAR) 100 MG tablet Take 100 mg by mouth daily.    . metFORMIN (GLUCOPHAGE-XR) 500 MG 24 hr tablet Take 500 mg by mouth 2 (two) times daily.     . metoprolol (TOPROL-XL) 100 MG 24 hr tablet Take 100 mg by mouth every morning.     . ondansetron (ZOFRAN) 8 MG tablet Take 1 tablet (8 mg total) by mouth every 8 (eight) hours as needed for nausea or vomiting. 30 tablet 3  . PACLitaxel Protein-Bound Part (ABRAXANE IV) Inject into the vein. To be given weekly    . pantoprazole (PROTONIX) 40 MG tablet TAKE 1 TABLET (40 MG TOTAL) BY MOUTH DAILY. 90 tablet 0  . polyethylene glycol (MIRALAX / GLYCOLAX) packet Take 17 g by mouth daily as needed for  mild constipation. 14 each 0  . prochlorperazine (COMPAZINE) 10 MG tablet Take 1 tablet (10 mg total) by mouth every 6 (six) hours as needed for nausea or vomiting. 30 tablet 2  . sulfamethoxazole-trimethoprim (BACTRIM,SEPTRA) 400-80 MG per tablet Reported on 03/01/2015    . Trastuzumab (HERCEPTIN IV) Inject into the vein. To be given weekly    . verapamil (CALAN-SR) 120 MG CR tablet Take 120 mg by mouth daily.     No current facility-administered medications on file prior to visit.    Past Surgical History  Procedure Laterality Date  . Pilonidal cyst excision    . Bladder suspension      x 2  . Abcess drainage      on bottom  . Cardiac catheterization  02/21/2011    Normal coronary arteries, normal EF  . Tonsillectomy  yrs ago  .  Irrigation and debridement abscess Right 12/23/2012    Procedure: incision  AND DEBRIDEMENT right buttock infection ;  Surgeon: Kandis Cocking, MD;  Location: [facility];  Service: General;  Laterality: Right;  . Hydradenitis excision Left 12/08/2013    Procedure: EXCISION HIDRADENITIS AXILLA;  Surgeon: [name], MD;  Location: [facility];  Service: General;  Laterality: Left;  . Excision hydradenitis labia N/A 12/08/2013    Procedure: EXCISION HIDRADENITIS PUBIC AREA;  Surgeon: [name], MD;  Location: [facility];  Service: General;  Laterality: N/A;  . Breast lumpectomy with radioactive seed and sentinel lymph node biopsy Left 12/25/2014    Procedure: RADIOACTIVE SEED GIUDED LEFT BREAST LUMPECTOMY, LEFT AXILLARY SENTINEL LYMPH NODE BIOPSY;  Surgeon: [name], MD;  Location: [facility];  Service: General;  Laterality: Left;  . Abdominal hysterectomy  25 yrs ago    partial  . Portacath placement N/A 01/12/2015    Procedure: INSERTION PORT-A-CATH;  Surgeon: [name], MD;  Location: [facility];  Service: General;  Laterality: N/A;    Denies any headaches, dizziness, double vision, fevers, chills, night sweats, nausea, vomiting, diarrhea, constipation, chest pain,  heart palpitations, shortness of breath, blood in stool, black tarry stool, urinary pain, urinary burning, urinary frequency, hematuria.   PHYSICAL EXAMINATION  ECOG PERFORMANCE STATUS: 2 - Symptomatic, <50% confined to bed  There were no vitals filed for this visit.  GENERAL:alert, no distress, comfortable, cooperative, obese, smiling and in an exam room, accompanied by family member. SKIN: skin color, texture, turgor are normal.  HEAD: Normocephalic, No masses, tenderness.  Diffuse acne-like rash on scalp. EYES: normal, EOMI, Conjunctiva are pink and non-injected EARS: External ears normal OROPHARYNX:lips, buccal mucosa, and tongue normal and mucous membranes are moist  NECK: supple, trachea midline LYMPH:  Not examined BREAST:not examined LUNGS: clear to auscultation HEART: RRR ABDOMEN:abdomen soft, non-tender, obese and normal bowel sounds BACK: Back symmetric, no curvature., No CVA tenderness EXTREMITIES:less then 2 second capillary refill, no joint deformities, effusion, or inflammation, no skin discoloration, no cyanosis, positive findings:  edema 1+ pitting edema in B/L LE.  NEURO: alert & oriented x 3 with fluent speech, no focal motor/sensory deficits   LABORATORY DATA: CBC    Component Value Date/Time   WBC 5.4 04/05/2015 1016   RBC 3.75* 04/05/2015 1016   HGB 9.9* 04/05/2015 1016   HCT 32.6* 04/05/2015 1016   PLT 309 04/05/2015 1016   MCV 86.9 04/05/2015 1016   MCH 26.4 04/05/2015 1016   MCHC 30.4 04/05/2015 1016   RDW 18.5* 04/05/2015 1016   LYMPHSABS 1.6 04/05/2015 1016   MONOABS 0.2 04/05/2015 1016   EOSABS 0.1 04/05/2015 1016   BASOSABS 0.0 04/05/2015 1016      Chemistry      Component Value Date/Time   NA 138 04/05/2015 1016   K 3.4* 04/05/2015 1016   CL 100* 04/05/2015 1016   CO2 29 04/05/2015 1016   BUN 9 04/05/2015 1016   CREATININE 0.81 04/05/2015 1016      Component Value Date/Time   CALCIUM 9.0 04/05/2015 1016   ALKPHOS 95 04/05/2015 1016    AST 23 04/05/2015 1016   ALT 20 04/05/2015 1016   BILITOT 0.8 04/05/2015 1016        PENDING LABS:   RADIOGRAPHIC STUDIES:  No results found.   PATHOLOGY:    ASSESSMENT AND PLAN:  Breast cancer of upper-outer quadrant of left female breast (HCC) Stage IIA (T1N1c) invasive ductal carcinoma of left breast, ER+/PR+/HER2+, with 1/1 sentinel lymph  node for metastatic disease.  Oncology history is updated.  Pre-chemo labs today: CBC diff, CMET.    Return as scheduled in 2 weeks for follow-up and her final Abraxane treatment.  Her port was placed by Dr. Lucia Gaskins.  It is tilted according to nurses.  This causes discomfort at time of access.  No change at this time, but if it becomes an issues, we may need to consider revision/manipulation of Gen Surg.  She is scheduled for Rad Onc consultation next Thursday AM at 7:30 with Dr. Lisbeth Renshaw.  I suspect she will start XRT ~3-4 weeks out from completion of Abraxane which is scheduled to be completed on 04/19/2015.    I have built future Herceptin antibody plan (every 3 weeks) to begin 1 week following her 12th treatment of Abraxane/herceptin without loading dose.  Antibody plan manipulated accordingly.  Hypokalemia noted.  I will increase Kdur to 40 mEq daily.  New Rx escribed.   THERAPY PLAN:  Continue with weekly treatment as planned.  She will finish Abraxane on 04/19/2015 according to her current treatment plan schedule.  She will continue with Herceptin x 52 weeks.  She is scheduled to see Dr. Lisbeth Renshaw next Thursday, 04/12/2015.  She will move on with XRT following completion of systemic chemotherapy.  This will be followed by anti-endocrine therapy.  All questions were answered. The patient knows to call the clinic with any problems, questions or concerns. We can certainly see the patient much sooner if necessary.  Patient and plan discussed with Dr. Ancil Linsey and she is in agreement with the aforementioned.   This note is  electronically signed by: Doy Mince 04/05/2015 11:31 AM

## 2015-04-04 NOTE — Assessment & Plan Note (Addendum)
Stage IIA (T1N1c) invasive ductal carcinoma of left breast, ER+/PR+/HER2+, with 1/1 sentinel lymph node for metastatic disease.  Oncology history is updated.  Pre-chemo labs today: CBC diff, CMET.    Return as scheduled in 2 weeks for follow-up and her final Abraxane treatment.  Her port was placed by Dr. Lucia Gaskins.  It is tilted according to nurses.  This causes discomfort at time of access.  No change at this time, but if it becomes an issues, we may need to consider revision/manipulation of Gen Surg.  She is scheduled for Rad Onc consultation next Thursday AM at 7:30 with Dr. Lisbeth Renshaw.  I suspect she will start XRT ~3-4 weeks out from completion of Abraxane which is scheduled to be completed on 04/19/2015.    I have built future Herceptin antibody plan (every 3 weeks) to begin 1 week following her 12th treatment of Abraxane/herceptin without loading dose.  Antibody plan manipulated accordingly.  Hypokalemia noted.  I will increase Kdur to 40 mEq daily.  New Rx escribed.

## 2015-04-05 ENCOUNTER — Encounter (HOSPITAL_BASED_OUTPATIENT_CLINIC_OR_DEPARTMENT_OTHER): Payer: Medicare Other

## 2015-04-05 ENCOUNTER — Encounter: Payer: Self-pay | Admitting: Dietician

## 2015-04-05 ENCOUNTER — Inpatient Hospital Stay (HOSPITAL_COMMUNITY): Payer: Medicare Other

## 2015-04-05 ENCOUNTER — Encounter (HOSPITAL_COMMUNITY): Payer: Medicare Other | Attending: Hematology & Oncology | Admitting: Oncology

## 2015-04-05 VITALS — BP 138/55 | HR 73 | Temp 97.8°F | Resp 18

## 2015-04-05 DIAGNOSIS — C50412 Malignant neoplasm of upper-outer quadrant of left female breast: Secondary | ICD-10-CM

## 2015-04-05 DIAGNOSIS — D509 Iron deficiency anemia, unspecified: Secondary | ICD-10-CM | POA: Diagnosis present

## 2015-04-05 DIAGNOSIS — D649 Anemia, unspecified: Secondary | ICD-10-CM | POA: Diagnosis present

## 2015-04-05 DIAGNOSIS — Z5111 Encounter for antineoplastic chemotherapy: Secondary | ICD-10-CM

## 2015-04-05 DIAGNOSIS — C773 Secondary and unspecified malignant neoplasm of axilla and upper limb lymph nodes: Secondary | ICD-10-CM

## 2015-04-05 DIAGNOSIS — Z5112 Encounter for antineoplastic immunotherapy: Secondary | ICD-10-CM | POA: Diagnosis not present

## 2015-04-05 DIAGNOSIS — L738 Other specified follicular disorders: Secondary | ICD-10-CM | POA: Diagnosis present

## 2015-04-05 LAB — CBC WITH DIFFERENTIAL/PLATELET
Basophils Absolute: 0 10*3/uL (ref 0.0–0.1)
Basophils Relative: 0 %
Eosinophils Absolute: 0.1 10*3/uL (ref 0.0–0.7)
Eosinophils Relative: 2 %
HEMATOCRIT: 32.6 % — AB (ref 36.0–46.0)
HEMOGLOBIN: 9.9 g/dL — AB (ref 12.0–15.0)
LYMPHS ABS: 1.6 10*3/uL (ref 0.7–4.0)
Lymphocytes Relative: 30 %
MCH: 26.4 pg (ref 26.0–34.0)
MCHC: 30.4 g/dL (ref 30.0–36.0)
MCV: 86.9 fL (ref 78.0–100.0)
MONOS PCT: 4 %
Monocytes Absolute: 0.2 10*3/uL (ref 0.1–1.0)
NEUTROS ABS: 3.5 10*3/uL (ref 1.7–7.7)
NEUTROS PCT: 64 %
Platelets: 309 10*3/uL (ref 150–400)
RBC: 3.75 MIL/uL — ABNORMAL LOW (ref 3.87–5.11)
RDW: 18.5 % — ABNORMAL HIGH (ref 11.5–15.5)
WBC: 5.4 10*3/uL (ref 4.0–10.5)

## 2015-04-05 LAB — COMPREHENSIVE METABOLIC PANEL
ALK PHOS: 95 U/L (ref 38–126)
ALT: 20 U/L (ref 14–54)
ANION GAP: 9 (ref 5–15)
AST: 23 U/L (ref 15–41)
Albumin: 3.5 g/dL (ref 3.5–5.0)
BILIRUBIN TOTAL: 0.8 mg/dL (ref 0.3–1.2)
BUN: 9 mg/dL (ref 6–20)
CALCIUM: 9 mg/dL (ref 8.9–10.3)
CO2: 29 mmol/L (ref 22–32)
Chloride: 100 mmol/L — ABNORMAL LOW (ref 101–111)
Creatinine, Ser: 0.81 mg/dL (ref 0.44–1.00)
GFR calc non Af Amer: >60 mL/min (ref >60)
Glucose, Bld: 113 mg/dL — ABNORMAL HIGH (ref 65–99)
Potassium: 3.4 mmol/L — ABNORMAL LOW (ref 3.5–5.1)
Sodium: 138 mmol/L (ref 135–145)
TOTAL PROTEIN: 7.5 g/dL (ref 6.5–8.1)

## 2015-04-05 MED ORDER — SODIUM CHLORIDE 0.9 % IV SOLN
Freq: Once | INTRAVENOUS | Status: AC
  Administered 2015-04-05: 11:00:00 via INTRAVENOUS

## 2015-04-05 MED ORDER — PACLITAXEL PROTEIN-BOUND CHEMO INJECTION 100 MG
90.0000 mg/m2 | Freq: Once | INTRAVENOUS | Status: AC
  Administered 2015-04-05: 225 mg via INTRAVENOUS
  Filled 2015-04-05: qty 45

## 2015-04-05 MED ORDER — TRASTUZUMAB CHEMO INJECTION 440 MG
2.0000 mg/kg | Freq: Once | INTRAVENOUS | Status: AC
  Administered 2015-04-05: 273 mg via INTRAVENOUS
  Filled 2015-04-05: qty 13

## 2015-04-05 MED ORDER — ACETAMINOPHEN 325 MG PO TABS
650.0000 mg | ORAL_TABLET | Freq: Once | ORAL | Status: AC
  Administered 2015-04-05: 650 mg via ORAL

## 2015-04-05 MED ORDER — HEPARIN SOD (PORK) LOCK FLUSH 100 UNIT/ML IV SOLN
500.0000 [IU] | Freq: Once | INTRAVENOUS | Status: AC | PRN
  Administered 2015-04-05: 500 [IU]

## 2015-04-05 MED ORDER — PALONOSETRON HCL INJECTION 0.25 MG/5ML
INTRAVENOUS | Status: AC
  Filled 2015-04-05: qty 5

## 2015-04-05 MED ORDER — DIPHENHYDRAMINE HCL 25 MG PO CAPS
50.0000 mg | ORAL_CAPSULE | Freq: Once | ORAL | Status: AC
  Administered 2015-04-05: 50 mg via ORAL

## 2015-04-05 MED ORDER — ACETAMINOPHEN 325 MG PO TABS
ORAL_TABLET | ORAL | Status: AC
  Filled 2015-04-05: qty 2

## 2015-04-05 MED ORDER — HEPARIN SOD (PORK) LOCK FLUSH 100 UNIT/ML IV SOLN
INTRAVENOUS | Status: AC
  Filled 2015-04-05: qty 5

## 2015-04-05 MED ORDER — PALONOSETRON HCL INJECTION 0.25 MG/5ML
0.2500 mg | Freq: Once | INTRAVENOUS | Status: AC
  Administered 2015-04-05: 0.25 mg via INTRAVENOUS

## 2015-04-05 MED ORDER — SODIUM CHLORIDE 0.9 % IJ SOLN: 10.0000 mL | INTRAMUSCULAR | Status: DC | PRN

## 2015-04-05 MED ORDER — POTASSIUM CHLORIDE CRYS ER 20 MEQ PO TBCR: 20.0000 meq | EXTENDED_RELEASE_TABLET | Freq: Two times a day (BID) | ORAL | Status: DC

## 2015-04-05 MED ORDER — DIPHENHYDRAMINE HCL 25 MG PO CAPS
ORAL_CAPSULE | ORAL | Status: AC
  Filled 2015-04-05: qty 2

## 2015-04-05 NOTE — Patient Instructions (Signed)
[place] at [facility] Discharge Instructions  RECOMMENDATIONS MADE BY THE CONSULTANT AND ANY TEST RESULTS WILL BE SENT TO YOUR REFERRING PHYSICIAN.  Exam and discussion today with Kirby Crigler, PA-C. Office visit in 2 weeks with Dr. Whitney Muse. Appointment with Dr. Lisbeth Renshaw scheduled on Thursday, 04/12/15. Chemotherapy as scheduled.   Thank you for choosing [place] at [facility] to provide your oncology and hematology care.  To afford each patient quality time with our provider, please arrive at least 15 minutes before your scheduled appointment time.   Beginning January 23rd 2017 lab work for the [facility] will be done in the  Main lab at [facility] on 1st floor. If you have a lab appointment with the [facility] please come in thru the  Main Entrance and check in at the main information desk  You need to re-schedule your appointment should you arrive 10 or more minutes late.  We strive to give you quality time with our providers, and arriving late affects you and other patients whose appointments are after yours.  Also, if you no show three or more times for appointments you may be dismissed from the clinic at the providers discretion.     Again, thank you for choosing [facility].  Our hope is that these requests will decrease the amount of time that you wait before being seen by our physicians.       _____________________________________________________________  Should you have questions after your visit to [facility], please contact our office at [phone] between the hours of 8:30 a.m. and 4:30 p.m.  Voicemails left after 4:30 p.m. will not be returned until the following business day.  For prescription refill requests, have your pharmacy contact our office.

## 2015-04-05 NOTE — Progress Notes (Signed)
Patient tolerated infusion well.  VSS.   

## 2015-04-05 NOTE — Patient Instructions (Signed)
[facility] Discharge Instructions for Patients Receiving Chemotherapy   Beginning January 23rd 2017 lab work for the [facility] will be done in the  Main lab at [facility] on 1st floor. If you have a lab appointment with the [place] please come in thru the  Main Entrance and check in at the main information desk   Today you received the following chemotherapy agents: Herceptin and Abraxane.   Please see MD visit AVS for more information.      If you develop nausea and vomiting, or diarrhea that is not controlled by your medication, call the clinic.  The clinic phone number is [phone]. Office hours are Monday-Friday 8:30am-5:00pm.  BELOW ARE SYMPTOMS THAT SHOULD BE REPORTED IMMEDIATELY:  *FEVER GREATER THAN 101.0 F  *CHILLS WITH OR WITHOUT FEVER  NAUSEA AND VOMITING THAT IS NOT CONTROLLED WITH YOUR NAUSEA MEDICATION  *UNUSUAL SHORTNESS OF BREATH  *UNUSUAL BRUISING OR BLEEDING  TENDERNESS IN MOUTH AND THROAT WITH OR WITHOUT PRESENCE OF ULCERS  *URINARY PROBLEMS  *BOWEL PROBLEMS  UNUSUAL RASH Items with * indicate a potential emergency and should be followed up as soon as possible. If you have an emergency after office hours please contact your primary care physician or go to the nearest emergency department.  Please call the clinic during office hours if you have any questions or concerns.   You may also contact the Patient Navigator at [phone] should you have any questions or need assistance in obtaining follow up care.

## 2015-04-05 NOTE — Addendum Note (Signed)
Addended by: Baird Cancer on: 04/05/2015 11:31 AM   Modules accepted: Orders

## 2015-04-05 NOTE — Progress Notes (Signed)
Patient identified to be at risk for malnutrition on the MST secondary to wt loss and decreased PO intake  Contacted Pt by Visiting during chemo infusion   Wt Readings from Last 10 Encounters:  03/29/15 281 lb 9.6 oz (127.733 kg)  03/15/15 290 lb 8 oz (131.77 kg)  03/01/15 294 lb (133.358 kg)  02/22/15 297 lb (134.718 kg)  02/15/15 296 lb 14.4 oz (134.673 kg)  02/08/15 301 lb (136.533 kg)  02/01/15 301 lb 3.2 oz (136.623 kg)  01/23/15 300 lb 11.2 oz (136.397 kg)  01/12/15 301 lb 9 oz (136.788 kg)  01/09/15 301 lb 9.6 oz (136.805 kg)   Patient weight has has decreased by 20 lbs in 2 months in a little over 1 month.   Patient reports oral intake as poor and is suffering from symptoms including taste changes, poor taste and nauseating smells.  Pt reports that she notices the biggest taste changes in meats/grains/sweets. The way vegetables taste has not changed as much. Because of the taste changes, she has been less inclined to eat her favorite foods and, as a result, her PO intake has suffered.   Discussed with pt and family member that taste changes in chemo are extremely common and there may be dietary/behavior changes that help alleviate the symptoms. We discussed maintaining good oral care and using a bicarb wash to "perk up" the taste buds. RD reccommended adding sweeteners and tart flavorings, such as lemon, to her foods as these can help mask the changes. RD suggested using plastic silver ware in placed of metal to help decrease metallic taste.    RD emphasized importance of protein. If she is unable to eat the meat due to taste changes, went over alternative sources: legumes, eggs, milk. cheese or protein supplements. She states she eats most of these and drinks Premier protein.   Finally discussed ways to mitigate nauseating smells. Reccommended eating frozen foods, which patient has already had success with, and eating foods at room temperature as opposed to hot. Suggested eating  foods that need little preparation-sandwiches, cereal, yogurt and fruit etc.  Pt seemed to be thankful for the tips/suggestions.  Left handout titled "Taste and Smell Changes"  Burtis Junes RD, LDN Nutrition Pager: [phone] 04/05/2015 11:04 AM

## 2015-04-12 ENCOUNTER — Encounter (HOSPITAL_BASED_OUTPATIENT_CLINIC_OR_DEPARTMENT_OTHER): Payer: Medicare Other

## 2015-04-12 VITALS — BP 157/76 | HR 71 | Temp 97.6°F | Resp 18

## 2015-04-12 DIAGNOSIS — C50412 Malignant neoplasm of upper-outer quadrant of left female breast: Secondary | ICD-10-CM

## 2015-04-12 DIAGNOSIS — Z5112 Encounter for antineoplastic immunotherapy: Secondary | ICD-10-CM

## 2015-04-12 LAB — COMPREHENSIVE METABOLIC PANEL
ALBUMIN: 3.6 g/dL (ref 3.5–5.0)
ALK PHOS: 96 U/L (ref 38–126)
ALT: 23 U/L (ref 14–54)
ANION GAP: 7 (ref 5–15)
AST: 22 U/L (ref 15–41)
BUN: 8 mg/dL (ref 6–20)
CALCIUM: 9 mg/dL (ref 8.9–10.3)
CO2: 30 mmol/L (ref 22–32)
Chloride: 101 mmol/L (ref 101–111)
Creatinine, Ser: 0.95 mg/dL (ref 0.44–1.00)
GFR calc Af Amer: >60 mL/min (ref >60)
GFR calc non Af Amer: >60 mL/min (ref >60)
GLUCOSE: 109 mg/dL — AB (ref 65–99)
POTASSIUM: 4.2 mmol/L (ref 3.5–5.1)
SODIUM: 138 mmol/L (ref 135–145)
Total Bilirubin: 0.7 mg/dL (ref 0.3–1.2)
Total Protein: 7.6 g/dL (ref 6.5–8.1)

## 2015-04-12 LAB — CBC WITH DIFFERENTIAL/PLATELET
BASOS ABS: 0 10*3/uL (ref 0.0–0.1)
BASOS PCT: 0 %
EOS ABS: 0.1 10*3/uL (ref 0.0–0.7)
Eosinophils Relative: 2 %
HCT: 32.9 % — ABNORMAL LOW (ref 36.0–46.0)
HEMOGLOBIN: 10.1 g/dL — AB (ref 12.0–15.0)
Lymphocytes Relative: 36 %
Lymphs Abs: 2.3 10*3/uL (ref 0.7–4.0)
MCH: 26.8 pg (ref 26.0–34.0)
MCHC: 30.7 g/dL (ref 30.0–36.0)
MCV: 87.3 fL (ref 78.0–100.0)
MONOS PCT: 5 %
Monocytes Absolute: 0.3 10*3/uL (ref 0.1–1.0)
NEUTROS PCT: 57 %
Neutro Abs: 3.5 10*3/uL (ref 1.7–7.7)
Platelets: 312 10*3/uL (ref 150–400)
RBC: 3.77 MIL/uL — ABNORMAL LOW (ref 3.87–5.11)
RDW: 18.8 % — ABNORMAL HIGH (ref 11.5–15.5)
WBC: 6.2 10*3/uL (ref 4.0–10.5)

## 2015-04-12 MED ORDER — SODIUM CHLORIDE 0.9 % IJ SOLN: 10.0000 mL | INTRAMUSCULAR | Status: DC | PRN

## 2015-04-12 MED ORDER — SODIUM CHLORIDE 0.9 % IV SOLN
Freq: Once | INTRAVENOUS | Status: AC
  Administered 2015-04-12: 11:00:00 via INTRAVENOUS

## 2015-04-12 MED ORDER — HEPARIN SOD (PORK) LOCK FLUSH 100 UNIT/ML IV SOLN
500.0000 [IU] | Freq: Once | INTRAVENOUS | Status: AC | PRN
  Administered 2015-04-12: 500 [IU]
  Filled 2015-04-12: qty 5

## 2015-04-12 MED ORDER — PALONOSETRON HCL INJECTION 0.25 MG/5ML
0.2500 mg | Freq: Once | INTRAVENOUS | Status: AC
  Administered 2015-04-12: 0.25 mg via INTRAVENOUS

## 2015-04-12 MED ORDER — PALONOSETRON HCL INJECTION 0.25 MG/5ML
INTRAVENOUS | Status: AC
  Filled 2015-04-12: qty 5

## 2015-04-12 MED ORDER — TRASTUZUMAB CHEMO INJECTION 440 MG
2.0000 mg/kg | Freq: Once | INTRAVENOUS | Status: AC
  Administered 2015-04-12: 273 mg via INTRAVENOUS
  Filled 2015-04-12: qty 13

## 2015-04-12 MED ORDER — DIPHENHYDRAMINE HCL 25 MG PO CAPS
50.0000 mg | ORAL_CAPSULE | Freq: Once | ORAL | Status: AC
  Administered 2015-04-12: 50 mg via ORAL
  Filled 2015-04-12 (×2): qty 2

## 2015-04-12 MED ORDER — PACLITAXEL PROTEIN-BOUND CHEMO INJECTION 100 MG
90.0000 mg/m2 | Freq: Once | INTRAVENOUS | Status: AC
  Administered 2015-04-12: 225 mg via INTRAVENOUS
  Filled 2015-04-12: qty 45

## 2015-04-12 MED ORDER — ACETAMINOPHEN 325 MG PO TABS
650.0000 mg | ORAL_TABLET | Freq: Once | ORAL | Status: AC
  Administered 2015-04-12: 650 mg via ORAL
  Filled 2015-04-12: qty 2

## 2015-04-12 NOTE — Progress Notes (Signed)
Patient tolerated infusion well.  VSS.  Patient wheeled out in wheelchair by family member.

## 2015-04-12 NOTE — Patient Instructions (Signed)
[facility] Discharge Instructions for Patients Receiving Chemotherapy   Beginning January 23rd 2017 lab work for the [facility] will be done in the  Main lab at [facility] on 1st floor. If you have a lab appointment with the [facility] please come in thru the  Main Entrance and check in at the main information desk   Today you received the following chemotherapy agents: Abraxane and Herceptin.     If you develop nausea and vomiting, or diarrhea that is not controlled by your medication, call the clinic.  The clinic phone number is [phone]. Office hours are Monday-Friday 8:30am-5:00pm.  BELOW ARE SYMPTOMS THAT SHOULD BE REPORTED IMMEDIATELY:  *FEVER GREATER THAN 101.0 F  *CHILLS WITH OR WITHOUT FEVER  NAUSEA AND VOMITING THAT IS NOT CONTROLLED WITH YOUR NAUSEA MEDICATION  *UNUSUAL SHORTNESS OF BREATH  *UNUSUAL BRUISING OR BLEEDING  TENDERNESS IN MOUTH AND THROAT WITH OR WITHOUT PRESENCE OF ULCERS  *URINARY PROBLEMS  *BOWEL PROBLEMS  UNUSUAL RASH Items with * indicate a potential emergency and should be followed up as soon as possible. If you have an emergency after office hours please contact your primary care physician or go to the nearest emergency department.  Please call the clinic during office hours if you have any questions or concerns.   You may also contact the Patient Navigator at [phone] should you have any questions or need assistance in obtaining follow up care.

## 2015-04-19 ENCOUNTER — Encounter (HOSPITAL_BASED_OUTPATIENT_CLINIC_OR_DEPARTMENT_OTHER): Payer: Medicare Other

## 2015-04-19 ENCOUNTER — Encounter (HOSPITAL_COMMUNITY): Payer: Self-pay | Admitting: Hematology & Oncology

## 2015-04-19 ENCOUNTER — Encounter (HOSPITAL_BASED_OUTPATIENT_CLINIC_OR_DEPARTMENT_OTHER): Payer: Medicare Other | Admitting: Hematology & Oncology

## 2015-04-19 VITALS — BP 127/79 | HR 87 | Temp 98.0°F | Resp 18 | Wt 274.0 lb

## 2015-04-19 VITALS — BP 117/56 | HR 73 | Temp 98.2°F | Resp 18

## 2015-04-19 DIAGNOSIS — Z5111 Encounter for antineoplastic chemotherapy: Secondary | ICD-10-CM

## 2015-04-19 DIAGNOSIS — E669 Obesity, unspecified: Secondary | ICD-10-CM

## 2015-04-19 DIAGNOSIS — E611 Iron deficiency: Secondary | ICD-10-CM | POA: Diagnosis not present

## 2015-04-19 DIAGNOSIS — E119 Type 2 diabetes mellitus without complications: Secondary | ICD-10-CM

## 2015-04-19 DIAGNOSIS — G620 Drug-induced polyneuropathy: Secondary | ICD-10-CM

## 2015-04-19 DIAGNOSIS — C50412 Malignant neoplasm of upper-outer quadrant of left female breast: Secondary | ICD-10-CM

## 2015-04-19 DIAGNOSIS — Z5112 Encounter for antineoplastic immunotherapy: Secondary | ICD-10-CM | POA: Diagnosis present

## 2015-04-19 DIAGNOSIS — D6481 Anemia due to antineoplastic chemotherapy: Secondary | ICD-10-CM | POA: Diagnosis not present

## 2015-04-19 DIAGNOSIS — G629 Polyneuropathy, unspecified: Secondary | ICD-10-CM | POA: Diagnosis not present

## 2015-04-19 DIAGNOSIS — T451X5A Adverse effect of antineoplastic and immunosuppressive drugs, initial encounter: Secondary | ICD-10-CM

## 2015-04-19 DIAGNOSIS — E1165 Type 2 diabetes mellitus with hyperglycemia: Secondary | ICD-10-CM

## 2015-04-19 LAB — COMPREHENSIVE METABOLIC PANEL
ALT: 22 U/L (ref 14–54)
ANION GAP: 8 (ref 5–15)
AST: 27 U/L (ref 15–41)
Albumin: 3.6 g/dL (ref 3.5–5.0)
Alkaline Phosphatase: 93 U/L (ref 38–126)
BILIRUBIN TOTAL: 0.5 mg/dL (ref 0.3–1.2)
BUN: 9 mg/dL (ref 6–20)
CO2: 28 mmol/L (ref 22–32)
Calcium: 9 mg/dL (ref 8.9–10.3)
Chloride: 100 mmol/L — ABNORMAL LOW (ref 101–111)
Creatinine, Ser: 0.88 mg/dL (ref 0.44–1.00)
GFR calc Af Amer: >60 mL/min (ref >60)
Glucose, Bld: 122 mg/dL — ABNORMAL HIGH (ref 65–99)
POTASSIUM: 3.9 mmol/L (ref 3.5–5.1)
Sodium: 136 mmol/L (ref 135–145)
TOTAL PROTEIN: 7.8 g/dL (ref 6.5–8.1)

## 2015-04-19 LAB — CBC WITH DIFFERENTIAL/PLATELET
Basophils Absolute: 0 10*3/uL (ref 0.0–0.1)
Basophils Relative: 0 %
Eosinophils Absolute: 0 10*3/uL (ref 0.0–0.7)
Eosinophils Relative: 0 %
HEMATOCRIT: 31 % — AB (ref 36.0–46.0)
Hemoglobin: 9.7 g/dL — ABNORMAL LOW (ref 12.0–15.0)
LYMPHS ABS: 1.8 10*3/uL (ref 0.7–4.0)
LYMPHS PCT: 40 %
MCH: 27.4 pg (ref 26.0–34.0)
MCHC: 31.3 g/dL (ref 30.0–36.0)
MCV: 87.6 fL (ref 78.0–100.0)
MONO ABS: 0.3 10*3/uL (ref 0.1–1.0)
MONOS PCT: 6 %
NEUTROS ABS: 2.4 10*3/uL (ref 1.7–7.7)
Neutrophils Relative %: 54 %
Platelets: 277 10*3/uL (ref 150–400)
RBC: 3.54 MIL/uL — ABNORMAL LOW (ref 3.87–5.11)
RDW: 18.6 % — AB (ref 11.5–15.5)
WBC: 4.5 10*3/uL (ref 4.0–10.5)

## 2015-04-19 MED ORDER — SODIUM CHLORIDE 0.9 % IV SOLN
Freq: Once | INTRAVENOUS | Status: AC
  Administered 2015-04-19: 10:00:00 via INTRAVENOUS

## 2015-04-19 MED ORDER — ACETAMINOPHEN 325 MG PO TABS
ORAL_TABLET | ORAL | Status: AC
  Filled 2015-04-19: qty 2

## 2015-04-19 MED ORDER — SODIUM CHLORIDE 0.9 % IJ SOLN
10.0000 mL | INTRAMUSCULAR | Status: DC | PRN
  Administered 2015-04-19: 10 mL
  Filled 2015-04-19: qty 10

## 2015-04-19 MED ORDER — HEPARIN SOD (PORK) LOCK FLUSH 100 UNIT/ML IV SOLN
500.0000 [IU] | Freq: Once | INTRAVENOUS | Status: AC
  Administered 2015-04-19: 500 [IU] via INTRAVENOUS

## 2015-04-19 MED ORDER — ACETAMINOPHEN 325 MG PO TABS
650.0000 mg | ORAL_TABLET | Freq: Once | ORAL | Status: AC
  Administered 2015-04-19: 650 mg via ORAL

## 2015-04-19 MED ORDER — PALONOSETRON HCL INJECTION 0.25 MG/5ML
0.2500 mg | Freq: Once | INTRAVENOUS | Status: AC
  Administered 2015-04-19: 0.25 mg via INTRAVENOUS
  Filled 2015-04-19: qty 5

## 2015-04-19 MED ORDER — DIPHENHYDRAMINE HCL 25 MG PO CAPS
50.0000 mg | ORAL_CAPSULE | Freq: Once | ORAL | Status: AC
  Administered 2015-04-19: 50 mg via ORAL

## 2015-04-19 MED ORDER — PACLITAXEL PROTEIN-BOUND CHEMO INJECTION 100 MG
90.0000 mg/m2 | Freq: Once | INTRAVENOUS | Status: AC
  Administered 2015-04-19: 225 mg via INTRAVENOUS
  Filled 2015-04-19: qty 45

## 2015-04-19 MED ORDER — DIPHENHYDRAMINE HCL 25 MG PO CAPS
ORAL_CAPSULE | ORAL | Status: AC
  Filled 2015-04-19: qty 2

## 2015-04-19 MED ORDER — HEPARIN SOD (PORK) LOCK FLUSH 100 UNIT/ML IV SOLN
INTRAVENOUS | Status: AC
  Filled 2015-04-19: qty 5

## 2015-04-19 MED ORDER — TRASTUZUMAB CHEMO INJECTION 440 MG
2.0000 mg/kg | Freq: Once | INTRAVENOUS | Status: AC
  Administered 2015-04-19: 273 mg via INTRAVENOUS
  Filled 2015-04-19: qty 13

## 2015-04-19 NOTE — Patient Instructions (Addendum)
[place] at [facility] Discharge Instructions  RECOMMENDATIONS MADE BY THE CONSULTANT AND ANY TEST RESULTS WILL BE SENT TO YOUR REFERRING PHYSICIAN.   Exam and discussion by Dr Whitney Muse today Today is your last treatment, you will just continue herceptin every 3 weeks. You will start radiation. Lab work is good Return to see the doctor in 3 weeks  Please call the clinic if you have any questions or concerns   Thank you for choosing [place] at [facility] to provide your oncology and hematology care.  To afford each patient quality time with our provider, please arrive at least 15 minutes before your scheduled appointment time.   Beginning January 23rd 2017 lab work for the [facility] will be done in the  Main lab at [facility] on 1st floor. If you have a lab appointment with the [facility] please come in thru the  Main Entrance and check in at the main information desk  You need to re-schedule your appointment should you arrive 10 or more minutes late.  We strive to give you quality time with our providers, and arriving late affects you and other patients whose appointments are after yours.  Also, if you no show three or more times for appointments you may be dismissed from the clinic at the providers discretion.     Again, thank you for choosing [facility].  Our hope is that these requests will decrease the amount of time that you wait before being seen by our physicians.       _____________________________________________________________  Should you have questions after your visit to [facility], please contact our office at [phone] between the hours of 8:30 a.m. and 4:30 p.m.  Voicemails left after 4:30 p.m. will not be returned until the following business day.  For prescription refill requests, have your pharmacy contact our office.

## 2015-04-19 NOTE — Progress Notes (Signed)
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 female is here for follow-up of her ER+, PR+ HER 2 neu + breast cancer. Kristy Harris is doing extremely well with therapy. Kristy Harris is on weekly abraxane and herceptin.   Her is mood is good. Kristy Harris is sleeping well. States that Kristy Harris gets a taste for things but then it does not taste the same. Denies breathing issues.   Denies dysuria. Reports that the zofran resolves her nausea "pretty good" and is unsure if Kristy Harris has a refill for it.  Kristy Harris is here for cycle #12 herceptin and abraxane, Kristy Harris was receiving treatment during our visit.  Her toes and the ball of her feet continue to feel numb, noting that they have not worsened. Kristy Harris is still able to get around and is able to feel the floor. Her fingers are also numb, with no issues writing or using them. Kristy Harris has been using [employer] on her hands and feet. Kristy Harris notes that the numbness is "mild tingling" and that it does not limit her in anyway.  Reports tenderness in the left axillary region.  Denies bowel issues. Kristy Harris is relieved to be completed with therapy today.  Kristy Harris has already met with XRT and will begin soon.      Breast cancer of upper-outer quadrant of left female breast ([place])   11/01/2014 Mammogram  Possible mass in the left breast upper outer quadrant measuring 13 mm suspicious for breast cancer confirmed through ultrasound a spiculated hypoechoic mass ill-defined, no enlarged lymph nodes   11/01/2014 Initial Diagnosis Invasive ductal carcinoma, moderately differentiated, ER > 90%, PR> 90%, HER-2 -2+ by IHC, ratio 1.15, KI 67: 29%, T1 cN0 stage IA clinical stage   11/24/2014 Echocardiogram Systolic function was normal. The estimated ejection fraction was in the range of 55% to 60%.    12/25/2014 Surgery Left lumpectomy: IDC 1.7 cm, positive for LVI, with DCIS, 1/1 sentinel node positive deposit 1.9 cm with extracapsular extension, ER 90%, PR 90%, HER-2 positive ratio 2.4, Ki-67 29% T1 cN1 stage II a   02/01/2015 -  Chemotherapy Abraxane/Herceptin   03/20/2015 Echocardiogram Systolic function wasnormal. The estimated ejection fraction was in the range of 60%to 65%.     MEDICAL HISTORY:  Past Medical History  Diagnosis Date  . Diabetes mellitus   . Morbid obesity ([place])   . Recurrent boils     of perineal and buttocks  . Hypertension   . Hyperlipidemia   . COPD (chronic obstructive pulmonary disease) ([place])   . Enlarged heart   . Anemia   . Arthritis   . Heart murmur   . Urinary frequency   . Sleep apnea     no cpap used since weight loss  . H. pylori infection   . Diverticulosis april 2016  . Hidradenitis suppurativa   . PONV (postoperative nausea and vomiting)   . Aortic stenosis  mild AS 11/24/14 echo  . Shortness of breath dyspnea     with exertion  . Pneumonia years ago  . GERD (gastroesophageal reflux disease)   . Breast cancer ([place]) 11/01/14    left breast     SURGICAL HISTORY: Past Surgical History  Procedure Laterality Date  . Pilonidal cyst excision    . Bladder suspension      x 2  . Abcess drainage      on bottom  . Cardiac catheterization  02/21/2011    Normal coronary arteries, normal EF  . Tonsillectomy  yrs ago  . Irrigation and debridement  abscess Right 12/23/2012    Procedure: incision  AND DEBRIDEMENT right buttock infection ;  Surgeon: Shann Medal, MD;  Location: [facility];  Service: General;  Laterality: Right;  . Hydradenitis excision Left 12/08/2013    Procedure: EXCISION HIDRADENITIS AXILLA;  Surgeon: Alphonsa Overall, MD;  Location: [facility];  Service: General;  Laterality: Left;  . Excision hydradenitis labia N/A 12/08/2013    Procedure: EXCISION HIDRADENITIS PUBIC AREA;  Surgeon: Alphonsa Overall, MD;  Location: [facility];  Service: General;  Laterality: N/A;  . Breast lumpectomy with radioactive seed and sentinel lymph node biopsy Left 12/25/2014    Procedure: RADIOACTIVE SEED GIUDED LEFT BREAST LUMPECTOMY, LEFT AXILLARY SENTINEL LYMPH NODE BIOPSY;  Surgeon: Alphonsa Overall, MD;  Location: [facility];  Service: General;  Laterality: Left;  . Abdominal hysterectomy  25 yrs ago    partial  . Portacath placement N/A 01/12/2015    Procedure: INSERTION PORT-A-CATH;  Surgeon: Alphonsa Overall, MD;  Location: [facility];  Service: General;  Laterality: N/A;    SOCIAL HISTORY: Social History   Social History  . Marital Status: Widowed    Spouse Name: N/A  . Number of Children: 5  . Years of Education: N/A   Occupational History  . Textile work     Disabled   Social History Main Topics  . Smoking status: Former Smoker -- 1.00 packs/day for 10 years    Types: Cigarettes    Quit date: 03/03/1994  . Smokeless tobacco: Never Used  . Alcohol Use: No  . Drug Use: No  . Sexual Activity: Not on file   Other Topics Concern  . Not on file   Social History Narrative   Lives with grandson.  Widowed.  Has 2 sons and 3 daughters  Widowed 16 years 9 children, oldest is 60 yo 8 grandchildren 1 great grandchild Worked in a factory Kristy Harris used to enjoy fishing and play softball. Ex smoker, quit 20 years ago. ETOH, none  FAMILY HISTORY: Family History  Problem Relation Age of Onset  . Heart attack Mother     had multiple health problems  . Lung  cancer Father 93    smoker  . Prostate cancer Brother   . Lung cancer Brother     dx. 76s; smoker  . Throat cancer Brother     dx. 60s; smoker  . Diabetes Sister     has 3 living sisters with multiple health problems  . Hypertension Brother   . Hypertension Son   . Hypertension Daughter   . Breast cancer Sister 93  . Breast cancer Sister     dx. 109s  . Breast cancer Maternal Grandmother     dx. 35s  . Breast cancer Maternal Aunt     dx. older than 37  . Dementia Maternal Aunt   . Breast cancer Cousin     dx. 61s  indicated that her mother is deceased. Kristy Harris indicated that her father is deceased. Kristy Harris indicated that two of her four sisters are alive. Kristy Harris indicated that all of her three brothers are deceased. Kristy Harris indicated that her maternal grandmother is deceased. Kristy Harris indicated that her maternal grandfather is deceased. Kristy Harris indicated that her paternal grandmother is deceased. Kristy Harris indicated that her paternal grandfather is deceased. Kristy Harris indicated that her daughter is alive. Kristy Harris indicated that only one of her two sons is alive. Kristy Harris indicated that both of her maternal aunts are deceased. Kristy Harris indicated that her maternal uncle is deceased. Kristy Harris indicated that her paternal aunt is deceased. Kristy Harris indicated that her cousin is alive.  Mother died at 74 of a heart attack, Kristy Harris had a large heart and a hole in her heart. Non-smoker Father died at 21 of lung cancer, he was a smoker. 9 siblings, 4 living. One sister with breast cancer bilaterally, Kristy Harris is still living Another sister with breast cancer  ALLERGIES:  is allergic to lisinopril and penicillins.  MEDICATIONS:  Current Outpatient Prescriptions  Medication Sig Dispense Refill  . acetaminophen (TYLENOL) 500 MG tablet Take 1,000 mg by mouth every 6 (six) hours as needed for mild pain or headache.    . albuterol (PROVENTIL HFA;VENTOLIN HFA) 108 (90 BASE) MCG/ACT inhaler Inhale 2 puffs into the lungs every 4 (four) hours as needed for shortness  of breath.    Marland Kitchen atorvastatin (LIPITOR) 20 MG tablet Take 20 mg by mouth every morning.     . bismuth subsalicylate (PEPTO BISMOL) 262 MG chewable tablet Chew 2 tablets (524 mg total) by mouth 2 (two) times daily. (Patient taking differently: Chew 524 mg by mouth 2 (two) times daily as needed for indigestion (acid reflux). ) 22 tablet 0  . Cholecalciferol (VITAMIN D-3) 5000 UNITS TABS Take 5,000 Units by mouth every morning.    Marland Kitchen doxycycline (VIBRA-TABS) 100 MG tablet Take 1 tablet (100 mg total) by mouth 2 (two) times daily. 14 tablet 0  . ferrous sulfate 325 (65 FE) MG tablet Take 1 tablet (325 mg total) by mouth every morning. (Patient taking differently: Take 325 mg by mouth 3 (three) times daily with meals. ) 30 tablet 0  . furosemide (LASIX) 20 MG tablet TAKE ONE TABLET BY MOUTH ONCE DAILY.    Marland Kitchen glipiZIDE (GLUCOTROL XL) 10 MG 24 hr tablet Take 10 mg by mouth daily with breakfast.     . HYDROcodone-acetaminophen (NORCO/VICODIN) 5-325 MG per tablet Take 1 tablet by mouth 2 (two) times daily as needed for moderate pain.     Marland Kitchen ketoconazole (NIZORAL) 2 % cream Reported on 03/01/2015  0  . lidocaine-prilocaine (EMLA) cream Apply a quarter size amount to port site 1 hour prior to chemo. Do not rub in. Cover with plastic wrap. 30 g 3  . losartan (COZAAR) 100 MG tablet Take 100 mg by mouth daily.    . metFORMIN (GLUCOPHAGE-XR) 500 MG 24 hr tablet Take 500 mg by mouth 2 (two) times daily.     . metoprolol (TOPROL-XL) 100 MG 24 hr tablet Take 100 mg by mouth every morning.     . ondansetron (ZOFRAN) 8 MG tablet Take 1 tablet (8 mg total) by mouth every 8 (eight) hours as needed for nausea or vomiting. 30 tablet 3  . PACLitaxel Protein-Bound Part (ABRAXANE IV) Inject into the vein. To be given weekly    . pantoprazole (PROTONIX) 40 MG tablet TAKE 1 TABLET (40 MG TOTAL) BY MOUTH DAILY. 90 tablet  0  . polyethylene glycol (MIRALAX / GLYCOLAX) packet Take 17 g by mouth daily as needed for mild constipation. 14  each 0  . potassium chloride SA (K-DUR,KLOR-CON) 20 MEQ tablet Take 1 tablet (20 mEq total) by mouth 2 (two) times daily. 60 tablet 1  . prochlorperazine (COMPAZINE) 10 MG tablet Take 1 tablet (10 mg total) by mouth every 6 (six) hours as needed for nausea or vomiting. 30 tablet 2  . sulfamethoxazole-trimethoprim (BACTRIM,SEPTRA) 400-80 MG per tablet Reported on 03/01/2015    . Trastuzumab (HERCEPTIN IV) Inject into the vein. To be given weekly    . verapamil (CALAN-SR) 120 MG CR tablet Take 120 mg by mouth daily.     No current facility-administered medications for this visit.   Facility-Administered Medications Ordered in Other Visits  Medication Dose Route Frequency Provider Last Rate Last Dose  . PACLitaxel-protein bound (ABRAXANE) chemo infusion 225 mg  90 mg/m2 (Treatment Plan Actual) Intravenous Once Patrici Ranks, MD      . sodium chloride 0.9 % injection 10 mL  10 mL Intracatheter PRN Patrici Ranks, MD   10 mL at 04/19/15 1010    Review of Systems  Constitutional: Negative.  Negative for fever.       Taste change.  HENT: Negative.   Eyes: Negative.   Respiratory: Negative.  Negative for shortness of breath.   Cardiovascular: Negative.   Gastrointestinal: Negative for heartburn, nausea and blood in stool.       Nausea managed with zofran.  Genitourinary: Negative for dysuria.  Musculoskeletal: Negative.   Skin: Negative.   Neurological: Positive for tingling.       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  Endo/Heme/Allergies: Negative.   Psychiatric/Behavioral: Negative.   All other systems reviewed and are negative.  14 point ROS was done and is otherwise as detailed above or in HPI  PHYSICAL EXAMINATION: ECOG PERFORMANCE STATUS: 2 - Symptomatic, <50% confined to bed  Filed Vitals:   04/19/15 0929  BP: 127/79  Pulse: 87  Temp: 98 F (36.7 C)  Resp: 18   Filed Weights   04/19/15 0929  Weight: 274 lb (124.286 kg)    Physical Exam  Constitutional: Kristy Harris is  oriented to person, place, and time and well-developed, well-nourished, and in no distress.  Obese, wearing head scarf  HENT:  Head: Normocephalic and atraumatic.  Nose: Nose normal.  Mouth/Throat: Oropharynx is clear and moist. No oropharyngeal exudate.  Eyes: Conjunctivae and EOM are normal. Pupils are equal, round, and reactive to light. Right eye exhibits no discharge. Left eye exhibits no discharge. No scleral icterus.  Neck: Normal range of motion. Neck supple. No tracheal deviation present. No thyromegaly present.  Cardiovascular: Normal rate and regular rhythm.  Exam reveals no gallop and no friction rub.   Murmur heard. Pulmonary/Chest: Effort normal and breath sounds normal. Kristy Harris has no wheezes. Kristy Harris has no rales.  Abdominal: Soft. Bowel sounds are normal. Kristy Harris exhibits no distension and no mass. There is no tenderness. There is no rebound and no guarding.  Obese  Musculoskeletal: Normal range of motion. Kristy Harris exhibits no edema.  Lymphadenopathy:    Kristy Harris has no cervical adenopathy.  Neurological: Kristy Harris is alert and oriented to person, place, and time. No cranial nerve deficit. Coordination normal.  Skin: Skin is warm and dry. No rash noted.  Scar tissue palpated in the left axillary region along prior surgical incision site. No palpable mass  Psychiatric: Mood, memory, affect and judgment normal.  Nursing note and  vitals reviewed.   LABORATORY DATA:  I have reviewed the data as listed Lab Results  Component Value Date   WBC 4.5 04/19/2015   HGB 9.7* 04/19/2015   HCT 31.0* 04/19/2015   MCV 87.6 04/19/2015   PLT 277 04/19/2015   CMP     Component Value Date/Time   NA 136 04/19/2015 0920   K 3.9 04/19/2015 0920   CL 100* 04/19/2015 0920   CO2 28 04/19/2015 0920   GLUCOSE 122* 04/19/2015 0920   BUN 9 04/19/2015 0920   CREATININE 0.88 04/19/2015 0920   CALCIUM 9.0 04/19/2015 0920   PROT 7.8 04/19/2015 0920   ALBUMIN 3.6 04/19/2015 0920   AST 27 04/19/2015 0920   ALT 22  04/19/2015 0920   ALKPHOS 93 04/19/2015 0920   BILITOT 0.5 04/19/2015 0920   GFRNONAA >60 04/19/2015 0920   GFRAA >60 04/19/2015 0920    RADIOLOGY: I have personally reviewed the radiological images as listed and agreed with the findings in the report.  CLINICAL DATA: Status post port placement  EXAM: PORTABLE CHEST - 1 VIEW  COMPARISON: 12/08/2013  FINDINGS: Cardiac shadow remains enlarged. A new right-sided chest wall port is noted with the catheter tip at the cavoatrial junction. Downward depression of the catheter is noted at the first costoclavicular space. Mild atelectasis is noted bilaterally due to a poor inspiratory effort. Some vascular crowding is noted as well. No pneumothorax is seen.  IMPRESSION: No evidence of pneumothorax.  Poor inspiratory effort with bilateral atelectatic changes.  Downward depression of the catheter as described. This may predispose the catheter to potential pinch off syndrome.   Electronically Signed  By: Inez Catalina M.D.  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  I reviewed how Kristy Harris will proceed forward with herceptin over the next year.  Kristy Harris is going to be starting adjuvant XRT with Dr. Lisbeth Renshaw.  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 It is not limiting. Kristy Harris is done exceptionally well with therapy.  Kristy Harris will return for follow up with me when Kristy Harris is due for her next herceptin on 05/10/15.  Kristy Harris is up to date on cardiac monitoring.  All questions were answered. The patient knows to call the clinic with any problems, questions or  concerns.  This document serves as a record of services personally performed by Ancil Linsey, MD. It was created on her behalf by Arlyce Harman, a trained medical scribe. The creation of this record is based on the scribe's personal observations and the provider's statements to them. This document has been checked and approved by the attending provider.  I have reviewed the above documentation for accuracy and completeness, and I agree with the above.  This note was electronically signed.   Molli Hazard, MD  04/19/2015 10:21 AM

## 2015-04-19 NOTE — Progress Notes (Signed)
Tolerated chemo well. Stable on discharge home with family via wheelchair. 

## 2015-04-19 NOTE — Patient Instructions (Signed)
[facility] Discharge Instructions for Patients Receiving Chemotherapy  Today you received the following chemotherapy agents Herceptin and Abraxane.  To help prevent nausea and vomiting after your treatment, we encourage you to take your nausea medication as instructed. If you develop nausea and vomiting that is not controlled by your nausea medication, call the clinic. If it is after clinic hours your family physician or the after hours number for the clinic or go to the Emergency Department. BELOW ARE SYMPTOMS THAT SHOULD BE REPORTED IMMEDIATELY:  *FEVER GREATER THAN 101.0 F  *CHILLS WITH OR WITHOUT FEVER  NAUSEA AND VOMITING THAT IS NOT CONTROLLED WITH YOUR NAUSEA MEDICATION  *UNUSUAL SHORTNESS OF BREATH  *UNUSUAL BRUISING OR BLEEDING  TENDERNESS IN MOUTH AND THROAT WITH OR WITHOUT PRESENCE OF ULCERS  *URINARY PROBLEMS  *BOWEL PROBLEMS  UNUSUAL RASH Items with * indicate a potential emergency and should be followed up as soon as possible.  Return as scheduled.  I have been informed and understand all the instructions given to me. I know to contact the clinic, my physician, or go to the Emergency Department if any problems should occur. I do not have any questions at this time, but understand that I may call the clinic during office hours or the Patient Navigator at [phone] should I have any questions or need assistance in obtaining follow up care.    __________________________________________  _____________  __________ Signature of Patient or Authorized Representative            Date                   Time    __________________________________________ Nurse's Signature

## 2015-04-25 ENCOUNTER — Encounter (HOSPITAL_BASED_OUTPATIENT_CLINIC_OR_DEPARTMENT_OTHER): Payer: Medicare Other

## 2015-04-25 ENCOUNTER — Other Ambulatory Visit (HOSPITAL_COMMUNITY): Payer: Self-pay | Admitting: *Deleted

## 2015-04-25 DIAGNOSIS — C50412 Malignant neoplasm of upper-outer quadrant of left female breast: Secondary | ICD-10-CM | POA: Diagnosis not present

## 2015-04-25 LAB — CBC WITH DIFFERENTIAL/PLATELET
BASOS ABS: 0 10*3/uL (ref 0.0–0.1)
BASOS PCT: 0 %
EOS PCT: 1 %
Eosinophils Absolute: 0.1 10*3/uL (ref 0.0–0.7)
HEMATOCRIT: 26.6 % — AB (ref 36.0–46.0)
Hemoglobin: 8.2 g/dL — ABNORMAL LOW (ref 12.0–15.0)
LYMPHS PCT: 41 %
Lymphs Abs: 2.5 10*3/uL (ref 0.7–4.0)
MCH: 27.3 pg (ref 26.0–34.0)
MCHC: 30.8 g/dL (ref 30.0–36.0)
MCV: 88.7 fL (ref 78.0–100.0)
Monocytes Absolute: 0.3 10*3/uL (ref 0.1–1.0)
Monocytes Relative: 5 %
NEUTROS ABS: 3.2 10*3/uL (ref 1.7–7.7)
Neutrophils Relative %: 53 %
PLATELETS: 279 10*3/uL (ref 150–400)
RBC: 3 MIL/uL — AB (ref 3.87–5.11)
RDW: 19.1 % — ABNORMAL HIGH (ref 11.5–15.5)
WBC: 6.1 10*3/uL (ref 4.0–10.5)

## 2015-04-25 LAB — SAMPLE TO BLOOD BANK

## 2015-04-25 NOTE — Progress Notes (Signed)
Kristy Harris presented for labwork. Labs per MD order drawn via Peripheral Line 23 gauge needle inserted in rt arm Good blood return present. Procedure without incident.  Needle removed intact. Patient tolerated procedure well.

## 2015-04-27 ENCOUNTER — Encounter (HOSPITAL_COMMUNITY): Payer: Medicare Other

## 2015-05-03 ENCOUNTER — Other Ambulatory Visit (HOSPITAL_COMMUNITY): Payer: Self-pay | Admitting: Hematology & Oncology

## 2015-05-10 ENCOUNTER — Encounter (HOSPITAL_BASED_OUTPATIENT_CLINIC_OR_DEPARTMENT_OTHER): Payer: Medicare Other | Admitting: Hematology & Oncology

## 2015-05-10 ENCOUNTER — Encounter (HOSPITAL_COMMUNITY): Payer: Self-pay | Admitting: Hematology & Oncology

## 2015-05-10 ENCOUNTER — Encounter (HOSPITAL_COMMUNITY): Payer: Medicare Other | Attending: Hematology & Oncology

## 2015-05-10 VITALS — BP 127/50 | HR 74 | Temp 97.7°F | Resp 18

## 2015-05-10 VITALS — BP 144/55 | HR 88 | Temp 97.4°F | Resp 18 | Wt 267.0 lb

## 2015-05-10 DIAGNOSIS — C773 Secondary and unspecified malignant neoplasm of axilla and upper limb lymph nodes: Secondary | ICD-10-CM

## 2015-05-10 DIAGNOSIS — L738 Other specified follicular disorders: Secondary | ICD-10-CM | POA: Diagnosis present

## 2015-05-10 DIAGNOSIS — G620 Drug-induced polyneuropathy: Secondary | ICD-10-CM

## 2015-05-10 DIAGNOSIS — C50412 Malignant neoplasm of upper-outer quadrant of left female breast: Secondary | ICD-10-CM | POA: Diagnosis present

## 2015-05-10 DIAGNOSIS — D6481 Anemia due to antineoplastic chemotherapy: Secondary | ICD-10-CM | POA: Diagnosis not present

## 2015-05-10 DIAGNOSIS — D649 Anemia, unspecified: Secondary | ICD-10-CM | POA: Insufficient documentation

## 2015-05-10 DIAGNOSIS — D509 Iron deficiency anemia, unspecified: Secondary | ICD-10-CM | POA: Diagnosis present

## 2015-05-10 DIAGNOSIS — E669 Obesity, unspecified: Secondary | ICD-10-CM

## 2015-05-10 DIAGNOSIS — Z5112 Encounter for antineoplastic immunotherapy: Secondary | ICD-10-CM

## 2015-05-10 DIAGNOSIS — E119 Type 2 diabetes mellitus without complications: Secondary | ICD-10-CM | POA: Diagnosis not present

## 2015-05-10 DIAGNOSIS — E611 Iron deficiency: Secondary | ICD-10-CM | POA: Diagnosis not present

## 2015-05-10 DIAGNOSIS — T451X5A Adverse effect of antineoplastic and immunosuppressive drugs, initial encounter: Secondary | ICD-10-CM

## 2015-05-10 LAB — COMPREHENSIVE METABOLIC PANEL
ALK PHOS: 87 U/L (ref 38–126)
ALT: 24 U/L (ref 14–54)
AST: 24 U/L (ref 15–41)
Albumin: 3.4 g/dL — ABNORMAL LOW (ref 3.5–5.0)
Anion gap: 7 (ref 5–15)
BUN: 11 mg/dL (ref 6–20)
CALCIUM: 8.7 mg/dL — AB (ref 8.9–10.3)
CO2: 29 mmol/L (ref 22–32)
CREATININE: 0.84 mg/dL (ref 0.44–1.00)
Chloride: 103 mmol/L (ref 101–111)
GFR calc non Af Amer: >60 mL/min (ref >60)
GLUCOSE: 143 mg/dL — AB (ref 65–99)
Potassium: 3.6 mmol/L (ref 3.5–5.1)
SODIUM: 139 mmol/L (ref 135–145)
Total Bilirubin: 0.6 mg/dL (ref 0.3–1.2)
Total Protein: 7.1 g/dL (ref 6.5–8.1)

## 2015-05-10 LAB — CBC WITH DIFFERENTIAL/PLATELET
Basophils Absolute: 0 10*3/uL (ref 0.0–0.1)
Basophils Relative: 0 %
EOS ABS: 0.2 10*3/uL (ref 0.0–0.7)
Eosinophils Relative: 4 %
HCT: 29 % — ABNORMAL LOW (ref 36.0–46.0)
HEMOGLOBIN: 8.9 g/dL — AB (ref 12.0–15.0)
LYMPHS ABS: 2 10*3/uL (ref 0.7–4.0)
LYMPHS PCT: 41 %
MCH: 27.6 pg (ref 26.0–34.0)
MCHC: 30.7 g/dL (ref 30.0–36.0)
MCV: 90.1 fL (ref 78.0–100.0)
Monocytes Absolute: 0.4 10*3/uL (ref 0.1–1.0)
Monocytes Relative: 7 %
NEUTROS PCT: 48 %
Neutro Abs: 2.3 10*3/uL (ref 1.7–7.7)
Platelets: 231 10*3/uL (ref 150–400)
RBC: 3.22 MIL/uL — AB (ref 3.87–5.11)
RDW: 17.6 % — ABNORMAL HIGH (ref 11.5–15.5)
WBC: 4.8 10*3/uL (ref 4.0–10.5)

## 2015-05-10 MED ORDER — HEPARIN SOD (PORK) LOCK FLUSH 100 UNIT/ML IV SOLN
500.0000 [IU] | Freq: Once | INTRAVENOUS | Status: AC | PRN
  Administered 2015-05-10: 500 [IU]
  Filled 2015-05-10: qty 5

## 2015-05-10 MED ORDER — SODIUM CHLORIDE 0.9% FLUSH
10.0000 mL | INTRAVENOUS | Status: DC | PRN
  Administered 2015-05-10: 10 mL
  Filled 2015-05-10: qty 10

## 2015-05-10 MED ORDER — ACETAMINOPHEN 325 MG PO TABS
650.0000 mg | ORAL_TABLET | Freq: Once | ORAL | Status: AC
  Administered 2015-05-10: 650 mg via ORAL
  Filled 2015-05-10: qty 2

## 2015-05-10 MED ORDER — SODIUM CHLORIDE 0.9 % IV SOLN
Freq: Once | INTRAVENOUS | Status: AC
  Administered 2015-05-10: 10:00:00 via INTRAVENOUS

## 2015-05-10 MED ORDER — SODIUM CHLORIDE 0.9 % IV SOLN
8.0000 mg/kg | Freq: Once | INTRAVENOUS | Status: AC
  Administered 2015-05-10: 1029 mg via INTRAVENOUS
  Filled 2015-05-10: qty 49

## 2015-05-10 MED ORDER — DIPHENHYDRAMINE HCL 25 MG PO CAPS
50.0000 mg | ORAL_CAPSULE | Freq: Once | ORAL | Status: AC
  Administered 2015-05-10: 50 mg via ORAL
  Filled 2015-05-10: qty 2

## 2015-05-10 NOTE — Patient Instructions (Addendum)
[place] at [facility] Discharge Instructions  RECOMMENDATIONS MADE BY THE CONSULTANT AND ANY TEST RESULTS WILL BE SENT TO YOUR REFERRING PHYSICIAN.   Exam and discussion by Dr Whitney Muse today If you keep having neuropathy after your radiation, we could consider rehab. Herceptin today Herceptin in 3 weeks Next echo in april Return to see the doctor in 3 weeks  Please call the clinic if you have any questions or concerns    Thank you for choosing [place] at [facility] to provide your oncology and hematology care.  To afford each patient quality time with our provider, please arrive at least 15 minutes before your scheduled appointment time.   Beginning January 23rd 2017 lab work for the [facility] will be done in the  Main lab at [facility] on 1st floor. If you have a lab appointment with the [facility] please come in thru the  Main Entrance and check in at the main information desk  You need to re-schedule your appointment should you arrive 10 or more minutes late.  We strive to give you quality time with our providers, and arriving late affects you and other patients whose appointments are after yours.  Also, if you no show three or more times for appointments you may be dismissed from the clinic at the providers discretion.     Again, thank you for choosing [facility]  Our hope is that these requests will decrease the amount of time that you wait before being seen by our physicians.       _____________________________________________________________  Should you have questions after your visit to [facility], please contact our office at [phone] between the hours of 8:30 a.m. and 4:30 p.m.  Voicemails left after 4:30 p.m. will not be returned until the following business day.  For prescription refill requests, have your pharmacy contact our office.         Resources For Cancer Patients and  their Caregivers ? American Cancer Society: Can assist with transportation, wigs, general needs, runs Look Good Feel Better.        636-846-7168 ? Cancer Care: Provides financial assistance, online support groups, medication/co-pay assistance.  1-800-813-HOPE (225)637-6537) ? [employer] Assists [place] cancer patients and their families through emotional , educational and financial support.  575-444-4261 ? [place] DSS Where to apply for food stamps, Medicaid and utility assistance. 838-051-6273 ? [facility]: Transportation to medical appointments. 931-281-4323 ? Social Security Administration: May apply for disability if have a Stage IV cancer. (510)417-4817 (778)398-1936 ? [employer], Disability and Transit Services: Assists with nutrition, care and transit needs. (650)670-4157

## 2015-05-10 NOTE — Progress Notes (Signed)
Patient tolerated infusion well.  VSS.   

## 2015-05-10 NOTE — Progress Notes (Signed)
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 female is here for follow-up of her ER+, PR+ HER 2 neu + breast cancer. She is doing extremely well with therapy. She is on weekly abraxane and herceptin.   [name] was here with her daughter and granddaughter and was in a treatment bed. She was here today to get cycle #2 of Herceptin.   She says that she feels good and that her radiation is doing well.   She has had some numbness but it "has not been bad enough for me to scream and holler" She says that she sits up and works her fingers. She also was able to feel her toes during PE. She stated that she "can feel real good and all"  She says that she has been able to walk.  She said that her appetite has gotten better and that she can tell the difference now from when she was receiving the "bad stuff" She says that she has been eating and that the smell is not as bad. She asked if she could drink Noni.     Breast cancer of upper-outer quadrant of left female breast ([place])   11/01/2014 Mammogram Possible mass in the left breast upper outer quadrant measuring 13 mm suspicious for breast cancer confirmed through ultrasound a spiculated hypoechoic mass ill-defined, no enlarged lymph  nodes   11/01/2014 Initial Diagnosis Invasive ductal carcinoma, moderately differentiated, ER > 90%, PR> 90%, HER-2 -2+ by IHC, ratio 1.15, KI 67: 29%, T1 cN0 stage IA clinical stage   11/24/2014 Echocardiogram Systolic function was normal. The estimated ejection fraction was in the range of 55% to 60%.    12/25/2014 Surgery Left lumpectomy: IDC 1.7 cm, positive for LVI, with DCIS, 1/1 sentinel node positive deposit 1.9 cm with extracapsular extension, ER 90%, PR 90%, HER-2 positive ratio 2.4, Ki-67 29% T1 cN1 stage II a   02/01/2015 -  Chemotherapy Abraxane/Herceptin   03/20/2015 Echocardiogram Systolic function wasnormal. The estimated ejection fraction was in the range of 60%to 65%.     MEDICAL HISTORY:  Past Medical History  Diagnosis Date  . Diabetes mellitus   . Morbid obesity ([place])   . Recurrent boils     of perineal and buttocks  . Hypertension   . Hyperlipidemia   . COPD (chronic obstructive pulmonary disease) ([place])   . Enlarged heart   . Anemia   . Arthritis   . Heart murmur   . Urinary frequency   . Sleep apnea     no cpap used since weight loss  . H. pylori infection   . Diverticulosis april 2016  . Hidradenitis suppurativa   . PONV (postoperative nausea and vomiting)   . Aortic stenosis     mild AS 11/24/14 echo  . Shortness of breath dyspnea     with exertion  . Pneumonia years ago  .  GERD (gastroesophageal reflux disease)   . Breast cancer ([place]) 11/01/14    left breast     SURGICAL HISTORY: Past Surgical History  Procedure Laterality Date  . Pilonidal cyst excision    . Bladder suspension      x 2  . Abcess drainage      on bottom  . Cardiac catheterization  02/21/2011    Normal coronary arteries, normal EF  . Tonsillectomy  yrs ago  . Irrigation and debridement abscess Right 12/23/2012    Procedure: incision  AND DEBRIDEMENT right buttock infection ;  Surgeon: Shann Medal, MD;  Location: [facility];  Service: General;  Laterality: Right;  .  Hydradenitis excision Left 12/08/2013    Procedure: EXCISION HIDRADENITIS AXILLA;  Surgeon: Alphonsa Overall, MD;  Location: [facility];  Service: General;  Laterality: Left;  . Excision hydradenitis labia N/A 12/08/2013    Procedure: EXCISION HIDRADENITIS PUBIC AREA;  Surgeon: Alphonsa Overall, MD;  Location: [facility];  Service: General;  Laterality: N/A;  . Breast lumpectomy with radioactive seed and sentinel lymph node biopsy Left 12/25/2014    Procedure: RADIOACTIVE SEED GIUDED LEFT BREAST LUMPECTOMY, LEFT AXILLARY SENTINEL LYMPH NODE BIOPSY;  Surgeon: Alphonsa Overall, MD;  Location: [facility];  Service: General;  Laterality: Left;  . Abdominal hysterectomy  25 yrs ago    partial  . Portacath placement N/A 01/12/2015    Procedure: INSERTION PORT-A-CATH;  Surgeon: Alphonsa Overall, MD;  Location: [facility];  Service: General;  Laterality: N/A;    SOCIAL HISTORY: Social History   Social History  . Marital Status: Widowed    Spouse Name: N/A  . Number of Children: 5  . Years of Education: N/A   Occupational History  . Textile work     Disabled   Social History Main Topics  . Smoking status: Former Smoker -- 1.00 packs/day for 10 years    Types: Cigarettes    Quit date: 03/03/1994  . Smokeless tobacco: Never Used  . Alcohol Use: No  . Drug Use: No  . Sexual Activity: Not on file   Other Topics Concern  . Not on file   Social History Narrative   Lives with grandson.  Widowed.  Has 2 sons and 3 daughters  Widowed 18 years 20 children, oldest is 56 yo 8 grandchildren 1 great grandchild Worked in a factory She used to enjoy fishing and play softball. Ex smoker, quit 20 years ago. ETOH, none  FAMILY HISTORY: Family History  Problem Relation Age of Onset  . Heart attack Mother     had multiple health problems  . Lung cancer Father 36    smoker  . Prostate cancer Brother   . Lung cancer Brother     dx. 58s; smoker  . Throat cancer Brother     dx. 7s; smoker  . Diabetes Sister     has 3 living  sisters with multiple health problems  . Hypertension Brother   . Hypertension Son   . Hypertension Daughter   . Breast cancer Sister 76  . Breast cancer Sister     dx. 97s  . Breast cancer Maternal Grandmother     dx. 87s  . Breast cancer Maternal Aunt     dx. older than 29  . Dementia Maternal Aunt   . Breast cancer Cousin     dx. 46s   indicated that her mother is deceased. She indicated that her father is deceased. She indicated that two of her four sisters are  alive. She indicated that all of her three brothers are deceased. She indicated that her maternal grandmother is deceased. She indicated that her maternal grandfather is deceased. She indicated that her paternal grandmother is deceased. She indicated that her paternal grandfather is deceased. She indicated that her daughter is alive. She indicated that only one of her two sons is alive. She indicated that both of her maternal aunts are deceased. She indicated that her maternal uncle is deceased. She indicated that her paternal aunt is deceased. She indicated that her cousin is alive.  Mother died at 75 of a heart attack, she had a large heart and a hole in her heart. Non-smoker Father died at 46 of lung cancer, he was a smoker. 9 siblings, 4 living. One sister with breast cancer bilaterally, she is still living Another sister with breast cancer  ALLERGIES:  is allergic to lisinopril and penicillins.  MEDICATIONS:  Current Outpatient Prescriptions  Medication Sig Dispense Refill  . acetaminophen (TYLENOL) 500 MG tablet Take 1,000 mg by mouth every 6 (six) hours as needed for mild pain or headache.    . albuterol (PROVENTIL HFA;VENTOLIN HFA) 108 (90 BASE) MCG/ACT inhaler Inhale 2 puffs into the lungs every 4 (four) hours as needed for shortness of breath.    Marland Kitchen atorvastatin (LIPITOR) 20 MG tablet Take 20 mg by mouth every morning.     . bismuth subsalicylate (PEPTO BISMOL) 262 MG chewable tablet Chew 2 tablets (524 mg total) by  mouth 2 (two) times daily. (Patient taking differently: Chew 524 mg by mouth 2 (two) times daily as needed for indigestion (acid reflux). ) 22 tablet 0  . Cholecalciferol (VITAMIN D-3) 5000 UNITS TABS Take 5,000 Units by mouth every morning.    Marland Kitchen doxycycline (VIBRA-TABS) 100 MG tablet Take 1 tablet (100 mg total) by mouth 2 (two) times daily. 14 tablet 0  . ferrous sulfate 325 (65 FE) MG tablet Take 1 tablet (325 mg total) by mouth every morning. (Patient taking differently: Take 325 mg by mouth 3 (three) times daily with meals. ) 30 tablet 0  . furosemide (LASIX) 20 MG tablet TAKE ONE TABLET BY MOUTH ONCE DAILY.    Marland Kitchen glipiZIDE (GLUCOTROL XL) 10 MG 24 hr tablet Take 10 mg by mouth daily with breakfast.     . HYDROcodone-acetaminophen (NORCO/VICODIN) 5-325 MG per tablet Take 1 tablet by mouth 2 (two) times daily as needed for moderate pain.     Marland Kitchen ketoconazole (NIZORAL) 2 % cream Reported on 03/01/2015  0  . lidocaine-prilocaine (EMLA) cream APPLY A QUARTER SIXE AMOUNT TO PORT SITE 1 HOUR PRIO TO CHEMO. DO NOT RUB IN. COVER W/ PLASTIC WRAP 30 g 3  . losartan (COZAAR) 100 MG tablet Take 100 mg by mouth daily.    . metFORMIN (GLUCOPHAGE-XR) 500 MG 24 hr tablet Take 500 mg by mouth 2 (two) times daily.     . metoprolol (TOPROL-XL) 100 MG 24 hr tablet Take 100 mg by mouth every morning.     . ondansetron (ZOFRAN) 8 MG tablet Take 1 tablet (8 mg total) by mouth every 8 (eight) hours as needed for nausea or vomiting. 30 tablet 3  . PACLitaxel Protein-Bound Part (ABRAXANE IV) Inject into the vein. To be given weekly    . pantoprazole (PROTONIX) 40 MG tablet TAKE 1 TABLET (40 MG TOTAL) BY MOUTH DAILY. 90 tablet 0  . polyethylene glycol (MIRALAX / GLYCOLAX) packet Take 17 g by mouth daily as needed for mild constipation. 14 each  0  . potassium chloride SA (K-DUR,KLOR-CON) 20 MEQ tablet Take 1 tablet (20 mEq total) by mouth 2 (two) times daily. 60 tablet 1  . prochlorperazine (COMPAZINE) 10 MG tablet Take 1  tablet (10 mg total) by mouth every 6 (six) hours as needed for nausea or vomiting. 30 tablet 2  . Trastuzumab (HERCEPTIN IV) Inject into the vein. To be given weekly    . verapamil (CALAN-SR) 120 MG CR tablet Take 120 mg by mouth daily.     No current facility-administered medications for this visit.   Facility-Administered Medications Ordered in Other Visits  Medication Dose Route Frequency Provider Last Rate Last Dose  . heparin lock flush 100 unit/mL  500 Units Intracatheter Once PRN Patrici Ranks, MD      . sodium chloride flush (NS) 0.9 % injection 10 mL  10 mL Intracatheter PRN Patrici Ranks, MD      . trastuzumab (HERCEPTIN) 1,029 mg in sodium chloride 0.9 % 250 mL chemo infusion  8 mg/kg (Treatment Plan Actual) Intravenous Once Patrici Ranks, MD        Review of Systems  Constitutional: Negative.  Negative for fever.       Taste change.  HENT: Negative.   Eyes: Negative.   Respiratory: Negative.  Negative for shortness of breath.   Cardiovascular: Negative.   Gastrointestinal: Negative for heartburn, nausea and blood in stool.       Nausea managed with zofran.  Genitourinary: Negative for dysuria.  Musculoskeletal: Negative.   Skin: Negative.   Neurological: Positive for tingling.       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  Endo/Heme/Allergies: Negative.   Psychiatric/Behavioral: Negative.   All other systems reviewed and are negative.  14 point ROS was done and is otherwise as detailed above or in HPI  PHYSICAL EXAMINATION: ECOG PERFORMANCE STATUS: 2 - Symptomatic, <50% confined to bed   Filed Vitals:   05/10/15 0843  BP: 144/55  Pulse: 88  Temp: 97.4 F (36.3 C)  Resp: 18   Filed Weights   05/10/15 0843  Weight: 267 lb (121.11 kg)    Physical Exam  Constitutional: She is oriented to person, place, and time and well-developed, well-nourished, and in no distress.  Obese, wearing head scarf  HENT:  Head: Normocephalic and atraumatic.  Nose: Nose  normal.  Mouth/Throat: Oropharynx is clear and moist. No oropharyngeal exudate.  Eyes: Conjunctivae and EOM are normal. Pupils are equal, round, and reactive to light. Right eye exhibits no discharge. Left eye exhibits no discharge. No scleral icterus.  Neck: Normal range of motion. Neck supple. No tracheal deviation present. No thyromegaly present.  Cardiovascular: Normal rate and regular rhythm.  Exam reveals no gallop and no friction rub.   No murmur heard. Pulmonary/Chest: Effort normal and breath sounds normal. She has no wheezes. She has no rales.  Abdominal: Soft. Bowel sounds are normal. She exhibits no distension and no mass. There is no tenderness. There is no rebound and no guarding.  Obese  Musculoskeletal: Normal range of motion. She exhibits no edema.  Lymphadenopathy:    She has no cervical adenopathy.  Neurological: She is alert and oriented to person, place, and time. No cranial nerve deficit. Coordination normal.  Skin: Skin is warm and dry. No rash noted.  Scar tissue palpated in the left axillary region along prior surgical incision site. No palpable mass  Psychiatric: Mood, memory, affect and judgment normal.  Nursing note and vitals reviewed.   LABORATORY DATA:  I have reviewed the data as listed Lab Results  Component Value Date   WBC 4.8 05/10/2015   HGB 8.9* 05/10/2015   HCT 29.0* 05/10/2015   MCV 90.1 05/10/2015   PLT 231 05/10/2015   CMP     Component Value Date/Time   NA 139 05/10/2015 0922   K 3.6 05/10/2015 0922   CL 103 05/10/2015 0922   CO2 29 05/10/2015 0922   GLUCOSE 143* 05/10/2015 0922   BUN 11 05/10/2015 0922   CREATININE 0.84 05/10/2015 0922   CALCIUM 8.7* 05/10/2015 0922   PROT 7.1 05/10/2015 0922   ALBUMIN 3.4* 05/10/2015 0922   AST 24 05/10/2015 0922   ALT 24 05/10/2015 0922   ALKPHOS 87 05/10/2015 0922   BILITOT 0.6 05/10/2015 0922   GFRNONAA >60 05/10/2015 0922   GFRAA >60 05/10/2015 8182    RADIOLOGY: I have personally  reviewed the radiological images as listed and agreed with the findings in the report.  CLINICAL DATA: Status post port placement  EXAM: PORTABLE CHEST - 1 VIEW  COMPARISON: 12/08/2013  FINDINGS: Cardiac shadow remains enlarged. A new right-sided chest wall port is noted with the catheter tip at the cavoatrial junction. Downward depression of the catheter is noted at the first costoclavicular space. Mild atelectasis is noted bilaterally due to a poor inspiratory effort. Some vascular crowding is noted as well. No pneumothorax is seen.  IMPRESSION: No evidence of pneumothorax.  Poor inspiratory effort with bilateral atelectatic changes.  Downward depression of the catheter as described. This may predispose the catheter to potential pinch off syndrome.   Electronically Signed  By: Inez Catalina M.D.  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 She has started XRT. She is here today for cycle #1 of Q3week herceptin.   She needs another ECHO in April.  She will return in 3 weeks for a follow up and ongoing herceptin. She does not need refills.   All questions were answered. The patient knows to call the clinic with any problems, questions or concerns.  This document serves as a record of services personally performed by Ancil Linsey, MD. It was created on her behalf by Kandace Blitz, a trained medical scribe. The creation of this record is based on the scribe's personal observations and the provider's statements to them. This document has been checked and approved by the attending provider.  I have reviewed the above documentation  for accuracy and completeness, and I agree with the above.  This note was electronically signed.  Molli Hazard, MD  05/10/2015 10:42 AM

## 2015-05-10 NOTE — Patient Instructions (Signed)
[facility] Discharge Instructions for Patients Receiving Chemotherapy   Beginning January 23rd 2017 lab work for the Cancer Center will be done in the  Main lab at [place] on 1st floor. If you have a lab appointment with the Cancer Center please come in thru the  Main Entrance and check in at the main information desk   Today you received the following chemotherapy agent: Herceptin   If you develop nausea and vomiting, or diarrhea that is not controlled by your medication, call the clinic.  The clinic phone number is [phone]. Office hours are Monday-Friday 8:30am-5:00pm.  BELOW ARE SYMPTOMS THAT SHOULD BE REPORTED IMMEDIATELY:  *FEVER GREATER THAN 101.0 F  *CHILLS WITH OR WITHOUT FEVER  NAUSEA AND VOMITING THAT IS NOT CONTROLLED WITH YOUR NAUSEA MEDICATION  *UNUSUAL SHORTNESS OF BREATH  *UNUSUAL BRUISING OR BLEEDING  TENDERNESS IN MOUTH AND THROAT WITH OR WITHOUT PRESENCE OF ULCERS  *URINARY PROBLEMS  *BOWEL PROBLEMS  UNUSUAL RASH Items with * indicate a potential emergency and should be followed up as soon as possible. If you have an emergency after office hours please contact your primary care physician or go to the nearest emergency department.  Please call the clinic during office hours if you have any questions or concerns.   You may also contact the Patient Navigator at [phone] should you have any questions or need assistance in obtaining follow up care.      Resources For Cancer Patients and their Caregivers ? American Cancer Society: Can assist with transportation, wigs, general needs, runs Look Good Feel Better.        1-888-227-6333 ? Cancer Care: Provides financial assistance, online support groups, medication/co-pay assistance.  1-800-813-HOPE (4673) ? [facility] Assists [place] cancer patients and their families through emotional , educational and financial support.   336-427-4357 ? [place] DSS Where to apply for food stamps, Medicaid and utility assistance. 336-342-1394 ? [facility]: Transportation to medical appointments. 336-347-2287 ? Social Security Administration: May apply for disability if have a Stage IV cancer. 336-342-7796 1-800-772-1213 ? [place] Aging, Disability and Transit Services: Assists with nutrition, care and transit needs. 336-349-2343          

## 2015-05-16 ENCOUNTER — Telehealth: Payer: Self-pay | Admitting: *Deleted

## 2015-05-16 NOTE — Telephone Encounter (Signed)
Spoke with patient to follow up after start of radiation.  She states she is doing ok.  Encouraged her to call with any needs or concerns.

## 2015-05-17 ENCOUNTER — Telehealth (HOSPITAL_COMMUNITY): Payer: Self-pay | Admitting: *Deleted

## 2015-05-17 NOTE — Telephone Encounter (Signed)
Expand All Collapse All   Pt called to let me know that she has some blood standing underneath the rt middle finger and when she presses on it - she can get blood (a drop) out from under the nail bed. She also had some oozing of clear fluid (a drop or two) from her rt great toe a couple of days - this has stopped. No odor or infectious looking process to either area. Pt has applied Peroxide to these areas. Will notify Dr. Whitney Muse.

## 2015-05-17 NOTE — Telephone Encounter (Signed)
Dr. Whitney Muse asked that patient soak her right foot daily in warm epsom salt waters. She is not really concerned with her finger nail. Pt is to watch these areas and notify me of improvement or worsening. She verbalized understanding of these instructions.

## 2015-05-29 NOTE — Assessment & Plan Note (Addendum)
Stage IIA (T1N1c) invasive ductal carcinoma of left breast, ER+/PR+/HER2+, with 1/1 sentinel lymph node for metastatic disease.  Oncology history is updated.  Pre-chemo labs today: CBC diff, CMET.    She is due for 2D echo in approximately 3 weeks.  Order is placed.  In preparation for AI therapy, will order a baseline bone density exam for the future.  Due to logistics, preferable after completion of radiation therapy.  Return in 3 weeks for next treatment and follow-up appointment.  At this time, she will need further information regarding AI therapy (informed consent, risks, benefits, alternatives, and side effects) and selection of AI medication.  She will continue with symptom management of her nails (side effect of chemotherapy).

## 2015-05-29 NOTE — Progress Notes (Signed)
[name], MD [address] [place] [address]  Breast cancer of upper-outer quadrant of left female breast ([place]) - Plan: ECHOCARDIOGRAM COMPLETE, DG Bone Density  Post-menopausal - Plan: ECHOCARDIOGRAM COMPLETE, DG Bone Density  CURRENT THERAPY: Herceptin and XRT.  INTERVAL HISTORY: Kristy Harris 70 y.o. 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    Breast cancer of upper-outer quadrant of left female breast ([place])   11/01/2014 Mammogram Possible mass in the left breast upper outer quadrant measuring 13 mm suspicious for breast cancer confirmed through ultrasound a spiculated hypoechoic mass ill-defined, no enlarged lymph nodes   11/01/2014 Initial Diagnosis Invasive ductal carcinoma, moderately differentiated, ER > 90%, PR> 90%, HER-2 -2+ by IHC, ratio 1.15, KI 67: 29%, T1 cN0 stage IA clinical stage   11/24/2014 Echocardiogram Systolic function was normal. The estimated ejection fraction was in the range of 55% to 60%.    12/25/2014 Surgery Left lumpectomy: IDC 1.7 cm, positive for LVI, with DCIS, 1/1 sentinel node positive deposit 1.9 cm with extracapsular extension, ER 90%, PR 90%, HER-2 positive ratio 2.4, Ki-67 29% T1 cN1 stage II a   02/01/2015 - 04/19/2015 Chemotherapy Abraxane/Herceptin   03/20/2015 Echocardiogram Systolic function wasnormal. The estimated ejection fraction was in the range of 60%to 65%.   05/08/2015 -  Radiation Therapy Dr. Lisbeth Renshaw (planned to be completed on 4/21)   05/10/2015 -  Antibody Plan Herceptin every 21 days    I personally reviewed and went over laboratory results with the patient.  The results are noted within this dictation.  She is due for a 2 D echo upcoming and given the ER/PR + cancer, we will ascertain a baseline bone density to evaluate her status prior to initiation of AI therapy.  She notes that her  nails are improving.  She has a few nails that are dark and 2-3 that have lifted from the nail bed.  She is soaking them in epsom salts daily and she is encouraged to continue to do so.  She is also advised that she can use OTC Neosporin (or similar).  She is using Band-Aids impregnated with something similar for an antibacterial effect.  Her appetite is strong.  She denies any nausea/vomiting.  She denies any other complaints today.    Past Medical History  Diagnosis Date  . Diabetes mellitus   . Morbid obesity ([place])   . Recurrent boils     of perineal and buttocks  . Hypertension   . Hyperlipidemia   . COPD (chronic obstructive pulmonary disease) ([place])   . Enlarged heart   . Anemia   . Arthritis   . Heart murmur   . Urinary frequency   . Sleep apnea     no cpap used since weight loss  . H. pylori infection   . Diverticulosis april 2016  . Hidradenitis suppurativa   . PONV (postoperative nausea and vomiting)   . Aortic stenosis     mild AS 11/24/14 echo  . Shortness of breath dyspnea     with exertion  . Pneumonia years ago  . GERD (gastroesophageal reflux disease)   . Breast cancer ([place]) 11/01/14    left breast     has Dyspnea; Hypertension; Hyperlipidemia; Obesity; Bruit; Murmur; Abnormal cardiovascular function study; Peripheral vascular disease ([place]); Diabetes mellitus ([place]); Arteritis ([place]); Abscess of buttock, right; Abscess of left thigh; Abscess of left  axilla; Iron deficiency anemia; Abscess of skin and subcutaneous tissue; H. pylori infection; Diverticulosis of colon with hemorrhage; Breast cancer of upper-outer quadrant of left female breast ([place]); Encounter for pre-operative cardiovascular clearance; Aortic stenosis; Normal coronary arteries; Morbid obesity ([place]); DJD (degenerative joint disease); Family history of breast cancer; and Genetic testing on her problem list.     is allergic to lisinopril and penicillins.  Current Outpatient Prescriptions on File Prior to  Visit  Medication Sig Dispense Refill  . acetaminophen (TYLENOL) 500 MG tablet Take 1,000 mg by mouth every 6 (six) hours as needed for mild pain or headache.    . albuterol (PROVENTIL HFA;VENTOLIN HFA) 108 (90 BASE) MCG/ACT inhaler Inhale 2 puffs into the lungs every 4 (four) hours as needed for shortness of breath.    Marland Kitchen atorvastatin (LIPITOR) 20 MG tablet Take 20 mg by mouth every morning.     . bismuth subsalicylate (PEPTO BISMOL) 262 MG chewable tablet Chew 2 tablets (524 mg total) by mouth 2 (two) times daily. (Patient taking differently: Chew 524 mg by mouth 2 (two) times daily as needed for indigestion (acid reflux). ) 22 tablet 0  . Cholecalciferol (VITAMIN D-3) 5000 UNITS TABS Take 5,000 Units by mouth every morning.    . ferrous sulfate 325 (65 FE) MG tablet Take 1 tablet (325 mg total) by mouth every morning. (Patient taking differently: Take 325 mg by mouth 3 (three) times daily with meals. ) 30 tablet 0  . furosemide (LASIX) 20 MG tablet TAKE ONE TABLET BY MOUTH ONCE DAILY.    Marland Kitchen glipiZIDE (GLUCOTROL XL) 10 MG 24 hr tablet Take 10 mg by mouth daily with breakfast.     . HYDROcodone-acetaminophen (NORCO/VICODIN) 5-325 MG per tablet Take 1 tablet by mouth 2 (two) times daily as needed for moderate pain.     Marland Kitchen ketoconazole (NIZORAL) 2 % cream Reported on 03/01/2015  0  . lidocaine-prilocaine (EMLA) cream APPLY A QUARTER SIXE AMOUNT TO PORT SITE 1 HOUR PRIO TO CHEMO. DO NOT RUB IN. COVER W/ PLASTIC WRAP 30 g 3  . losartan (COZAAR) 100 MG tablet Take 100 mg by mouth daily.    . metFORMIN (GLUCOPHAGE-XR) 500 MG 24 hr tablet Take 500 mg by mouth 2 (two) times daily.     . metoprolol (TOPROL-XL) 100 MG 24 hr tablet Take 100 mg by mouth every morning.     . ondansetron (ZOFRAN) 8 MG tablet Take 1 tablet (8 mg total) by mouth every 8 (eight) hours as needed for nausea or vomiting. 30 tablet 3  . pantoprazole (PROTONIX) 40 MG tablet TAKE 1 TABLET (40 MG TOTAL) BY MOUTH DAILY. 90 tablet 0  .  polyethylene glycol (MIRALAX / GLYCOLAX) packet Take 17 g by mouth daily as needed for mild constipation. 14 each 0  . potassium chloride SA (K-DUR,KLOR-CON) 20 MEQ tablet Take 1 tablet (20 mEq total) by mouth 2 (two) times daily. 60 tablet 1  . prochlorperazine (COMPAZINE) 10 MG tablet Take 1 tablet (10 mg total) by mouth every 6 (six) hours as needed for nausea or vomiting. 30 tablet 2  . Trastuzumab (HERCEPTIN IV) Inject into the vein. To be given weekly    . verapamil (CALAN-SR) 120 MG CR tablet Take 120 mg by mouth daily.     Current Facility-Administered Medications on File Prior to Visit  Medication Dose Route Frequency Provider Last Rate Last Dose  . heparin lock flush 100 unit/mL  500 Units Intracatheter Once PRN Patrici Ranks, MD      .  sodium chloride flush (NS) 0.9 % injection 10 mL  10 mL Intracatheter PRN Patrici Ranks, MD      . trastuzumab (HERCEPTIN) 756 mg in sodium chloride 0.9 % 250 mL chemo infusion  6 mg/kg (Treatment Plan Actual) Intravenous Once Patrici Ranks, MD        Past Surgical History  Procedure Laterality Date  . Pilonidal cyst excision    . Bladder suspension      x 2  . Abcess drainage      on bottom  . Cardiac catheterization  02/21/2011    Normal coronary arteries, normal EF  . Tonsillectomy  yrs ago  . Irrigation and debridement abscess Right 12/23/2012    Procedure: incision  AND DEBRIDEMENT right buttock infection ;  Surgeon: Shann Medal, MD;  Location: [facility];  Service: General;  Laterality: Right;  . Hydradenitis excision Left 12/08/2013    Procedure: EXCISION HIDRADENITIS AXILLA;  Surgeon: Alphonsa Overall, MD;  Location: [facility];  Service: General;  Laterality: Left;  . Excision hydradenitis labia N/A 12/08/2013    Procedure: EXCISION HIDRADENITIS PUBIC AREA;  Surgeon: Alphonsa Overall, MD;  Location: [facility];  Service: General;  Laterality: N/A;  . Breast lumpectomy with radioactive seed and sentinel lymph node biopsy Left 12/25/2014     Procedure: RADIOACTIVE SEED GIUDED LEFT BREAST LUMPECTOMY, LEFT AXILLARY SENTINEL LYMPH NODE BIOPSY;  Surgeon: Alphonsa Overall, MD;  Location: [facility];  Service: General;  Laterality: Left;  . Abdominal hysterectomy  25 yrs ago    partial  . Portacath placement N/A 01/12/2015    Procedure: INSERTION PORT-A-CATH;  Surgeon: Alphonsa Overall, MD;  Location: [facility];  Service: General;  Laterality: N/A;    Denies any headaches, dizziness, double vision, fevers, chills, night sweats, nausea, vomiting, diarrhea, constipation, chest pain, heart palpitations, shortness of breath, blood in stool, black tarry stool, urinary pain, urinary burning, urinary frequency, hematuria.   PHYSICAL EXAMINATION  ECOG PERFORMANCE STATUS: 2 - Symptomatic, <50% confined to bed  Filed Vitals:   05/31/15 0908  BP: 158/61  Pulse: 74  Temp: 97.6 F (36.4 C)  Resp: 20    GENERAL:alert, no distress, comfortable, cooperative, obese, smiling and Accompanied by her daughter and granddaughter. SKIN: skin color, texture, turgor are normal, no rashes or significant lesions HEAD: Normocephalic, No masses, lesions, tenderness or abnormalities EYES: normal, EOMI, Conjunctiva are pink and non-injected EARS: External ears normal OROPHARYNX:lips, buccal mucosa, and tongue normal and mucous membranes are moist  NECK: supple, trachea midline LYMPH:  no palpable lymphadenopathy BREAST:not examined LUNGS: clear to auscultation anteriorly HEART: regular rate & rhythm, no murmurs, no gallops, S1 normal and S2 normal ABDOMEN:abdomen soft, non-tender, obese and normal bowel sounds BACK: Back symmetric, no curvature. EXTREMITIES:less then 2 second capillary refill, no cyanosis, positive findings:  Some darkening of fingernails/toenails with 2-3 that have lifted off the nail bed without erythema, discharge, or odor.  NEURO: alert & oriented x 3 with fluent speech, no focal motor/sensory deficits   LABORATORY DATA: CBC    Component Value  Date/Time   WBC 3.2* 05/31/2015 0912   RBC 3.75* 05/31/2015 0912   HGB 10.5* 05/31/2015 0912   HCT 33.9* 05/31/2015 0912   PLT 218 05/31/2015 0912   MCV 90.4 05/31/2015 0912   MCH 28.0 05/31/2015 0912   MCHC 31.0 05/31/2015 0912   RDW 14.8 05/31/2015 0912   LYMPHSABS 1.0 05/31/2015 0912   MONOABS 0.3 05/31/2015 0912   EOSABS 0.2 05/31/2015 0912   BASOSABS 0.0  05/31/2015 0912      Chemistry      Component Value Date/Time   NA 138 05/31/2015 0912   K 3.8 05/31/2015 0912   CL 102 05/31/2015 0912   CO2 28 05/31/2015 0912   BUN 11 05/31/2015 0912   CREATININE 0.64 05/31/2015 0912      Component Value Date/Time   CALCIUM 8.8* 05/31/2015 0912   ALKPHOS 109 05/31/2015 0912   AST 29 05/31/2015 0912   ALT 23 05/31/2015 0912   BILITOT 0.4 05/31/2015 0912        PENDING LABS:   RADIOGRAPHIC STUDIES:  No results found.   PATHOLOGY:    ASSESSMENT AND PLAN:  Breast cancer of upper-outer quadrant of left female breast ([place]) Stage IIA (T1N1c) invasive ductal carcinoma of left breast, ER+/PR+/HER2+, with 1/1 sentinel lymph node for metastatic disease.  Oncology history is updated.  Pre-chemo labs today: CBC diff, CMET.    She is due for 2D echo in approximately 3 weeks.  Order is placed.  In preparation for AI therapy, will order a baseline bone density exam for the future.  Due to logistics, preferable after completion of radiation therapy.  Return in 3 weeks for next treatment and follow-up appointment.  At this time, she will need further information regarding AI therapy (informed consent, risks, benefits, alternatives, and side effects) and selection of AI medication.  She will continue with symptom management of her nails (side effect of chemotherapy).   THERAPY PLAN:  Continue Herceptin and complete 52 weeks worth of therapy.  Following completion of XRT, will discuss endocrine therapy.  All questions were answered. The patient knows to call the clinic with any  problems, questions or concerns. We can certainly see the patient much sooner if necessary.  Patient and plan discussed with Dr. Ancil Linsey and she is in agreement with the aforementioned.   This note is electronically signed by: Doy Mince 05/31/2015 9:37 AM

## 2015-05-31 ENCOUNTER — Encounter (HOSPITAL_BASED_OUTPATIENT_CLINIC_OR_DEPARTMENT_OTHER): Payer: Medicare Other | Admitting: Oncology

## 2015-05-31 ENCOUNTER — Encounter (HOSPITAL_COMMUNITY): Payer: Self-pay | Admitting: Oncology

## 2015-05-31 ENCOUNTER — Encounter (HOSPITAL_BASED_OUTPATIENT_CLINIC_OR_DEPARTMENT_OTHER): Payer: Medicare Other

## 2015-05-31 VITALS — BP 164/84 | HR 84 | Temp 97.8°F | Resp 18 | Wt 273.9 lb

## 2015-05-31 VITALS — BP 158/61 | HR 74 | Temp 97.6°F | Resp 20 | Wt 273.9 lb

## 2015-05-31 DIAGNOSIS — C50412 Malignant neoplasm of upper-outer quadrant of left female breast: Secondary | ICD-10-CM | POA: Diagnosis present

## 2015-05-31 DIAGNOSIS — Z5112 Encounter for antineoplastic immunotherapy: Secondary | ICD-10-CM

## 2015-05-31 DIAGNOSIS — Z780 Asymptomatic menopausal state: Secondary | ICD-10-CM

## 2015-05-31 LAB — COMPREHENSIVE METABOLIC PANEL
ALBUMIN: 3.6 g/dL (ref 3.5–5.0)
ALK PHOS: 109 U/L (ref 38–126)
ALT: 23 U/L (ref 14–54)
AST: 29 U/L (ref 15–41)
Anion gap: 8 (ref 5–15)
BUN: 11 mg/dL (ref 6–20)
CALCIUM: 8.8 mg/dL — AB (ref 8.9–10.3)
CO2: 28 mmol/L (ref 22–32)
CREATININE: 0.64 mg/dL (ref 0.44–1.00)
Chloride: 102 mmol/L (ref 101–111)
GFR calc Af Amer: >60 mL/min (ref >60)
GFR calc non Af Amer: >60 mL/min (ref >60)
GLUCOSE: 145 mg/dL — AB (ref 65–99)
Potassium: 3.8 mmol/L (ref 3.5–5.1)
SODIUM: 138 mmol/L (ref 135–145)
Total Bilirubin: 0.4 mg/dL (ref 0.3–1.2)
Total Protein: 7.7 g/dL (ref 6.5–8.1)

## 2015-05-31 LAB — CBC WITH DIFFERENTIAL/PLATELET
BASOS PCT: 0 %
Basophils Absolute: 0 10*3/uL (ref 0.0–0.1)
EOS ABS: 0.2 10*3/uL (ref 0.0–0.7)
EOS PCT: 5 %
HEMATOCRIT: 33.9 % — AB (ref 36.0–46.0)
HEMOGLOBIN: 10.5 g/dL — AB (ref 12.0–15.0)
LYMPHS PCT: 30 %
Lymphs Abs: 1 10*3/uL (ref 0.7–4.0)
MCH: 28 pg (ref 26.0–34.0)
MCHC: 31 g/dL (ref 30.0–36.0)
MCV: 90.4 fL (ref 78.0–100.0)
MONO ABS: 0.3 10*3/uL (ref 0.1–1.0)
MONOS PCT: 8 %
Neutro Abs: 1.8 10*3/uL (ref 1.7–7.7)
Neutrophils Relative %: 57 %
Platelets: 218 10*3/uL (ref 150–400)
RBC: 3.75 MIL/uL — AB (ref 3.87–5.11)
RDW: 14.8 % (ref 11.5–15.5)
WBC: 3.2 10*3/uL — ABNORMAL LOW (ref 4.0–10.5)

## 2015-05-31 MED ORDER — TRASTUZUMAB CHEMO INJECTION 440 MG
6.0000 mg/kg | Freq: Once | INTRAVENOUS | Status: AC
  Administered 2015-05-31: 756 mg via INTRAVENOUS
  Filled 2015-05-31: qty 36

## 2015-05-31 MED ORDER — SODIUM CHLORIDE 0.9 % IV SOLN
Freq: Once | INTRAVENOUS | Status: AC
  Administered 2015-05-31: 09:00:00 via INTRAVENOUS

## 2015-05-31 MED ORDER — HEPARIN SOD (PORK) LOCK FLUSH 100 UNIT/ML IV SOLN
500.0000 [IU] | Freq: Once | INTRAVENOUS | Status: AC | PRN
  Administered 2015-05-31: 500 [IU]
  Filled 2015-05-31: qty 5

## 2015-05-31 MED ORDER — DIPHENHYDRAMINE HCL 25 MG PO CAPS
50.0000 mg | ORAL_CAPSULE | Freq: Once | ORAL | Status: AC
  Administered 2015-05-31: 50 mg via ORAL

## 2015-05-31 MED ORDER — SODIUM CHLORIDE 0.9% FLUSH: 10.0000 mL | INTRAVENOUS | Status: DC | PRN

## 2015-05-31 MED ORDER — ACETAMINOPHEN 325 MG PO TABS
650.0000 mg | ORAL_TABLET | Freq: Once | ORAL | Status: AC
  Administered 2015-05-31: 650 mg via ORAL

## 2015-05-31 MED ORDER — ACETAMINOPHEN 325 MG PO TABS
ORAL_TABLET | ORAL | Status: AC
  Filled 2015-05-31: qty 2

## 2015-05-31 MED ORDER — DIPHENHYDRAMINE HCL 25 MG PO CAPS
ORAL_CAPSULE | ORAL | Status: AC
  Filled 2015-05-31: qty 2

## 2015-05-31 NOTE — Progress Notes (Signed)
Patient tolerated infusion well.  VSS.   

## 2015-05-31 NOTE — Patient Instructions (Signed)
[facility] Discharge Instructions for Patients Receiving Chemotherapy   Beginning January 23rd 2017 lab work for the Cancer Center will be done in the  Main lab at [facility] on 1st floor. If you have a lab appointment with the Cancer Center please come in thru the  Main Entrance and check in at the main information desk   Today you received the following chemotherapy agent: Herceptin   If you develop nausea and vomiting, or diarrhea that is not controlled by your medication, call the clinic.  The clinic phone number is [phone]. Office hours are Monday-Friday 8:30am-5:00pm.  BELOW ARE SYMPTOMS THAT SHOULD BE REPORTED IMMEDIATELY:  *FEVER GREATER THAN 101.0 F  *CHILLS WITH OR WITHOUT FEVER  NAUSEA AND VOMITING THAT IS NOT CONTROLLED WITH YOUR NAUSEA MEDICATION  *UNUSUAL SHORTNESS OF BREATH  *UNUSUAL BRUISING OR BLEEDING  TENDERNESS IN MOUTH AND THROAT WITH OR WITHOUT PRESENCE OF ULCERS  *URINARY PROBLEMS  *BOWEL PROBLEMS  UNUSUAL RASH Items with * indicate a potential emergency and should be followed up as soon as possible. If you have an emergency after office hours please contact your primary care physician or go to the nearest emergency department.  Please call the clinic during office hours if you have any questions or concerns.   You may also contact the Patient Navigator at [phone] should you have any questions or need assistance in obtaining follow up care.      Resources For Cancer Patients and their Caregivers ? American Cancer Society: Can assist with transportation, wigs, general needs, runs Look Good Feel Better.        1-888-227-6333 ? Cancer Care: Provides financial assistance, online support groups, medication/co-pay assistance.  1-800-813-HOPE (4673) ? [facility] Assists [place] cancer patients and their families through emotional , educational and financial support.   336-427-4357 ? [place] DSS Where to apply for food stamps, Medicaid and utility assistance. 336-342-1394 ? [facility]: Transportation to medical appointments. 336-347-2287 ? Social Security Administration: May apply for disability if have a Stage IV cancer. [phone] 1-800-772-1213 ? [place] Aging, Disability and Transit Services: Assists with nutrition, care and transit needs. [phone]          

## 2015-05-31 NOTE — Patient Instructions (Signed)
[place] at [facility] Discharge Instructions  RECOMMENDATIONS MADE BY THE CONSULTANT AND ANY TEST RESULTS WILL BE SENT TO YOUR REFERRING PHYSICIAN.  Exam done and seen today by Kirby Crigler Will need 2D Echo in 3 weeks before next herceptin treatment Bone density after they finish radiation, end of April. Return to see the Doctor in 3weeks with Dr.Penland Call the clinic for any concerns or questions.   Thank you for choosing [place] at [facility] to provide your oncology and hematology care.  To afford each patient quality time with our provider, please arrive at least 15 minutes before your scheduled appointment time.   Beginning January 23rd 2017 lab work for the [facility] will be done in the  Main lab at [facility] on 1st floor. If you have a lab appointment with the [facility] please come in thru the  Main Entrance and check in at the main information desk  You need to re-schedule your appointment should you arrive 10 or more minutes late.  We strive to give you quality time with our providers, and arriving late affects you and other patients whose appointments are after yours.  Also, if you no show three or more times for appointments you may be dismissed from the clinic at the providers discretion.     Again, thank you for choosing [facility].  Our hope is that these requests will decrease the amount of time that you wait before being seen by our physicians.       _____________________________________________________________  Should you have questions after your visit to [facility], please contact our office at [phone] between the hours of 8:30 a.m. and 4:30 p.m.  Voicemails left after 4:30 p.m. will not be returned until the following business day.  For prescription refill requests, have your pharmacy contact our office.         Resources For Cancer Patients and their  Caregivers ? American Cancer Society: Can assist with transportation, wigs, general needs, runs Look Good Feel Better.        4142431276 ? Cancer Care: Provides financial assistance, online support groups, medication/co-pay assistance.  1-800-813-HOPE 602-321-6513) ? [facility] Assists [place] cancer patients and their families through emotional , educational and financial support.  579-787-7290 ? [place] DSS Where to apply for food stamps, Medicaid and utility assistance. 980-860-5835 ? RCATS: Transportation to medical appointments. (671) 165-7369 ? Social Security Administration: May apply for disability if have a Stage IV cancer. [phone] 306-108-7574 ? [employer], Disability and Transit Services: Assists with nutrition, care and transit needs. [phone]

## 2015-06-13 ENCOUNTER — Ambulatory Visit (HOSPITAL_COMMUNITY)
Admission: RE | Admit: 2015-06-13 | Discharge: 2015-06-13 | Disposition: A | Discharge status: Home / Self Care | Payer: Medicare Other | Source: Ambulatory Visit | Attending: Oncology | Admitting: Oncology

## 2015-06-13 DIAGNOSIS — Z6841 Body Mass Index (BMI) 40.0 and over, adult: Secondary | ICD-10-CM | POA: Insufficient documentation

## 2015-06-13 DIAGNOSIS — I119 Hypertensive heart disease without heart failure: Secondary | ICD-10-CM | POA: Diagnosis not present

## 2015-06-13 DIAGNOSIS — E119 Type 2 diabetes mellitus without complications: Secondary | ICD-10-CM | POA: Diagnosis not present

## 2015-06-13 DIAGNOSIS — Z780 Asymptomatic menopausal state: Secondary | ICD-10-CM

## 2015-06-13 DIAGNOSIS — I340 Nonrheumatic mitral (valve) insufficiency: Secondary | ICD-10-CM | POA: Diagnosis not present

## 2015-06-13 DIAGNOSIS — C50412 Malignant neoplasm of upper-outer quadrant of left female breast: Secondary | ICD-10-CM | POA: Diagnosis not present

## 2015-06-13 DIAGNOSIS — E785 Hyperlipidemia, unspecified: Secondary | ICD-10-CM | POA: Insufficient documentation

## 2015-06-13 DIAGNOSIS — Z09 Encounter for follow-up examination after completed treatment for conditions other than malignant neoplasm: Secondary | ICD-10-CM | POA: Diagnosis present

## 2015-06-13 DIAGNOSIS — E669 Obesity, unspecified: Secondary | ICD-10-CM | POA: Diagnosis not present

## 2015-06-13 DIAGNOSIS — I350 Nonrheumatic aortic (valve) stenosis: Secondary | ICD-10-CM | POA: Diagnosis not present

## 2015-06-21 ENCOUNTER — Encounter (HOSPITAL_COMMUNITY): Payer: Medicare Other | Attending: Hematology & Oncology | Admitting: Hematology & Oncology

## 2015-06-21 ENCOUNTER — Encounter (HOSPITAL_BASED_OUTPATIENT_CLINIC_OR_DEPARTMENT_OTHER): Payer: Medicare Other

## 2015-06-21 ENCOUNTER — Encounter (HOSPITAL_COMMUNITY): Payer: Self-pay | Admitting: Hematology & Oncology

## 2015-06-21 ENCOUNTER — Encounter (HOSPITAL_COMMUNITY): Payer: Medicare Other

## 2015-06-21 ENCOUNTER — Telehealth: Payer: Self-pay | Admitting: *Deleted

## 2015-06-21 VITALS — BP 167/71 | HR 73 | Temp 97.6°F | Resp 20

## 2015-06-21 VITALS — BP 172/72 | HR 85 | Temp 97.2°F | Resp 20 | Wt 269.0 lb

## 2015-06-21 DIAGNOSIS — M2550 Pain in unspecified joint: Secondary | ICD-10-CM | POA: Diagnosis not present

## 2015-06-21 DIAGNOSIS — E611 Iron deficiency: Secondary | ICD-10-CM

## 2015-06-21 DIAGNOSIS — E669 Obesity, unspecified: Secondary | ICD-10-CM | POA: Diagnosis not present

## 2015-06-21 DIAGNOSIS — Z5112 Encounter for antineoplastic immunotherapy: Secondary | ICD-10-CM

## 2015-06-21 DIAGNOSIS — E559 Vitamin D deficiency, unspecified: Secondary | ICD-10-CM

## 2015-06-21 DIAGNOSIS — C50412 Malignant neoplasm of upper-outer quadrant of left female breast: Secondary | ICD-10-CM | POA: Insufficient documentation

## 2015-06-21 DIAGNOSIS — E119 Type 2 diabetes mellitus without complications: Secondary | ICD-10-CM

## 2015-06-21 DIAGNOSIS — D6481 Anemia due to antineoplastic chemotherapy: Secondary | ICD-10-CM

## 2015-06-21 DIAGNOSIS — L738 Other specified follicular disorders: Secondary | ICD-10-CM | POA: Insufficient documentation

## 2015-06-21 DIAGNOSIS — D649 Anemia, unspecified: Secondary | ICD-10-CM | POA: Insufficient documentation

## 2015-06-21 DIAGNOSIS — D509 Iron deficiency anemia, unspecified: Secondary | ICD-10-CM | POA: Insufficient documentation

## 2015-06-21 MED ORDER — ACETAMINOPHEN 325 MG PO TABS
650.0000 mg | ORAL_TABLET | Freq: Once | ORAL | Status: AC
  Administered 2015-06-21: 650 mg via ORAL
  Filled 2015-06-21: qty 2

## 2015-06-21 MED ORDER — HEPARIN SOD (PORK) LOCK FLUSH 100 UNIT/ML IV SOLN
500.0000 [IU] | Freq: Once | INTRAVENOUS | Status: AC | PRN
  Administered 2015-06-21: 500 [IU]
  Filled 2015-06-21: qty 5

## 2015-06-21 MED ORDER — DIPHENHYDRAMINE HCL 25 MG PO CAPS
50.0000 mg | ORAL_CAPSULE | Freq: Once | ORAL | Status: AC
  Administered 2015-06-21: 50 mg via ORAL
  Filled 2015-06-21: qty 2

## 2015-06-21 MED ORDER — TRASTUZUMAB CHEMO INJECTION 440 MG
6.0000 mg/kg | Freq: Once | INTRAVENOUS | Status: AC
  Administered 2015-06-21: 756 mg via INTRAVENOUS
  Filled 2015-06-21: qty 36

## 2015-06-21 MED ORDER — SODIUM CHLORIDE 0.9 % IV SOLN
Freq: Once | INTRAVENOUS | Status: AC
  Administered 2015-06-21: 09:00:00 via INTRAVENOUS

## 2015-06-21 MED ORDER — SODIUM CHLORIDE 0.9% FLUSH
10.0000 mL | INTRAVENOUS | Status: DC | PRN
  Administered 2015-06-21: 10 mL
  Filled 2015-06-21: qty 10

## 2015-06-21 NOTE — Patient Instructions (Signed)
[facility] Discharge Instructions for Patients Receiving Chemotherapy   Beginning January 23rd 2017 lab work for the [facility] will be done in the  Main lab at [facility] on 1st floor. If you have a lab appointment with the [facility] please come in thru the  Main Entrance and check in at the main information desk   Today you received the following chemotherapy agents herceptin  To help prevent nausea and vomiting after your treatment, we encourage you to take your nausea medication  If you develop nausea and vomiting, or diarrhea that is not controlled by your medication, call the clinic.  The clinic phone number is [phone]. Office hours are Monday-Friday 8:30am-5:00pm.  BELOW ARE SYMPTOMS THAT SHOULD BE REPORTED IMMEDIATELY:  *FEVER GREATER THAN 101.0 F  *CHILLS WITH OR WITHOUT FEVER  NAUSEA AND VOMITING THAT IS NOT CONTROLLED WITH YOUR NAUSEA MEDICATION  *UNUSUAL SHORTNESS OF BREATH  *UNUSUAL BRUISING OR BLEEDING  TENDERNESS IN MOUTH AND THROAT WITH OR WITHOUT PRESENCE OF ULCERS  *URINARY PROBLEMS  *BOWEL PROBLEMS  UNUSUAL RASH Items with * indicate a potential emergency and should be followed up as soon as possible. If you have an emergency after office hours please contact your primary care physician or go to the nearest emergency department.  Please call the clinic during office hours if you have any questions or concerns.   You may also contact the Patient Navigator at [phone] should you have any questions or need assistance in obtaining follow up care.      Resources For Cancer Patients and their Caregivers ? American Cancer Society: Can assist with transportation, wigs, general needs, runs Look Good Feel Better.        (774) 071-3336 ? Cancer Care: Provides financial assistance, online support groups, medication/co-pay assistance.  1-800-813-HOPE 276-838-3389) ? [facility] Assists [place] cancer  patients and their families through emotional , educational and financial support.  619-045-8063 ? [place] DSS Where to apply for food stamps, Medicaid and utility assistance. 216-213-2280 ? RCATS: Transportation to medical appointments. 6051552103 ? Social Security Administration: May apply for disability if have a Stage IV cancer. 3076051154 (201)668-9823 ? [employer], Disability and Transit Services: Assists with nutrition, care and transit needs. [phone]

## 2015-06-21 NOTE — Progress Notes (Signed)
Tolerated well

## 2015-06-21 NOTE — Patient Instructions (Addendum)
[place] at [facility] Discharge Instructions  RECOMMENDATIONS MADE BY THE CONSULTANT AND ANY TEST RESULTS WILL BE SENT TO YOUR REFERRING PHYSICIAN.    Exam and discussion by Dr Whitney Muse today Herceptin today Herceptin every 3 weeks  Echo of your heart was good Bone density next week Referral for home health for physical therapy, they will be in touch with you. Vitamin D level added for the next visit   Return to see the doctor in 3 weeks  Please call the clinic if you have any questions or concerns    Thank you for choosing [place] at [facility] to provide your oncology and hematology care.  To afford each patient quality time with our provider, please arrive at least 15 minutes before your scheduled appointment time.   Beginning January 23rd 2017 lab work for the [facility] will be done in the  Main lab at [facility] on 1st floor. If you have a lab appointment with the [facility] please come in thru the  Main Entrance and check in at the main information desk  You need to re-schedule your appointment should you arrive 10 or more minutes late.  We strive to give you quality time with our providers, and arriving late affects you and other patients whose appointments are after yours.  Also, if you no show three or more times for appointments you may be dismissed from the clinic at the providers discretion.     Again, thank you for choosing [facility].  Our hope is that these requests will decrease the amount of time that you wait before being seen by our physicians.       _____________________________________________________________  Should you have questions after your visit to [facility], please contact our office at [phone] between the hours of 8:30 a.m. and 4:30 p.m.  Voicemails left after 4:30 p.m. will not be returned until the following business day.  For prescription refill requests, have  your pharmacy contact our office.         Resources For Cancer Patients and their Caregivers ? American Cancer Society: Can assist with transportation, wigs, general needs, runs Look Good Feel Better.        219-609-1876 ? Cancer Care: Provides financial assistance, online support groups, medication/co-pay assistance.  1-800-813-HOPE (914)259-0047) ? [facility] Assists [place] cancer patients and their families through emotional , educational and financial support.  229-294-6403 ? [place] DSS Where to apply for food stamps, Medicaid and utility assistance. [phone] ? RCATS: Transportation to medical appointments. 640-349-7496 ? Social Security Administration: May apply for disability if have a Stage IV cancer. 234-005-9063 785-783-6386 ? [employer], Disability and Transit Services: Assists with nutrition, care and transit needs. [phone]

## 2015-06-21 NOTE — Telephone Encounter (Signed)
Called pt to congratulate on completion of xrt tomorrow 06/22/15. Relate doing well and without complaints. Encourage pt to call with needs. Received verbal understanding.

## 2015-06-21 NOTE — Progress Notes (Signed)
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 female is here for follow-up of her ER+, PR+ HER 2 neu + breast cancer. She is doing extremely well with therapy. She is on q 3 week herceptin.  Kristy Harris was here with her daughter and granddaughter and was in a treatment bed. She was here today to get cycle #3 of Herceptin.   She said that her legs have been hurting her worse now. It hurts mostly in her knees and ankles. She thinks that this may be because she is active every day now and her body is not used to this. She said that she can hardly walk now and has to use her walker to get around. She also said that she cannot put a lot of weight on her feet or legs because they hurt so bad. Some days her legs feel weak and some days they do not. She has arthritis. She notes she had problems before therapy but feels her pain is worse now. She denies "numbness" or tingling.  She stated that she has red spots on her fingernails and that her toenails have gotten darker. She wonders if her nail changes will just have to grow out.  She said that she occasionally takes her pain pill but some days she does not take it.   She said that she feels pretty good and that her appetite is returning.     Her recent Echo looked good.   She is finished with her radiation tomorrow and is happy about this.  She is having a bone density test on 4/27    Breast cancer of upper-outer quadrant of left female breast ([place])   11/01/2014 Mammogram Possible mass in the left breast upper outer quadrant measuring 13 mm suspicious for breast cancer confirmed through ultrasound a spiculated hypoechoic mass ill-defined, no enlarged lymph nodes   11/01/2014 Initial Diagnosis Invasive ductal carcinoma, moderately differentiated, ER > 90%, PR> 90%, HER-2 -2+ by IHC, ratio 1.15, KI 67: 29%, T1 cN0 stage IA clinical stage   11/24/2014 Echocardiogram Systolic function was normal. The estimated ejection fraction was in the range of 55% to 60%.    12/25/2014 Surgery Left lumpectomy: IDC 1.7 cm, positive for LVI, with DCIS, 1/1 sentinel node positive deposit 1.9 cm with extracapsular extension, ER 90%, PR 90%, HER-2 positive ratio 2.4, Ki-67 29% T1 cN1 stage II a   02/01/2015 - 04/19/2015 Chemotherapy Abraxane/Herceptin   03/20/2015 Echocardiogram Systolic function wasnormal. The estimated ejection fraction was in the range of 60%to 65%.   05/08/2015 -  Radiation Therapy Dr. Lisbeth Renshaw (planned to be completed on 4/21)   05/10/2015 -  Antibody Plan Herceptin every 21 days   06/13/2015 Echocardiogram 2D echo- The estimated ejection fraction was in the range of 60% to 65%. Diastolic function is abnormal, indeterminate grade. Wallmotion was normal.     MEDICAL HISTORY:  Past Medical History  Diagnosis Date  . Diabetes mellitus   . Morbid obesity ([place])   . Recurrent boils     of perineal and buttocks  . Hypertension   . Hyperlipidemia   . COPD (chronic obstructive pulmonary disease) ([place])   . Enlarged heart   . Anemia   . Arthritis   . Heart murmur   . Urinary frequency   . Sleep apnea     no cpap used since weight loss  . H. pylori infection   . Diverticulosis april 2016  . Hidradenitis suppurativa   . PONV  (postoperative nausea and vomiting)   . Aortic stenosis     mild AS 11/24/14 echo  . Shortness of breath dyspnea     with exertion  . Pneumonia years ago  . GERD (gastroesophageal reflux disease)   . Breast cancer ([place]) 11/01/14    left breast     SURGICAL HISTORY: Past Surgical History  Procedure Laterality Date  . Pilonidal cyst excision    . Bladder suspension      x 2  . Abcess drainage      on bottom  . Cardiac catheterization  02/21/2011    Normal coronary arteries, normal EF  . Tonsillectomy  yrs ago  . Irrigation and debridement abscess Right 12/23/2012    Procedure: incision  AND DEBRIDEMENT right buttock infection ;  Surgeon: Shann Medal, MD;  Location: [facility];  Service: General;  Laterality: Right;  . Hydradenitis excision Left 12/08/2013    Procedure: EXCISION HIDRADENITIS AXILLA;  Surgeon: Alphonsa Overall, MD;  Location: [facility];  Service: General;  Laterality: Left;  . Excision hydradenitis labia N/A 12/08/2013    Procedure: EXCISION HIDRADENITIS PUBIC AREA;  Surgeon: Alphonsa Overall, MD;  Location: [facility];  Service: General;  Laterality: N/A;  . Breast lumpectomy with radioactive seed and sentinel lymph node biopsy Left 12/25/2014    Procedure: RADIOACTIVE SEED GIUDED LEFT BREAST LUMPECTOMY, LEFT AXILLARY SENTINEL LYMPH NODE BIOPSY;  Surgeon: Alphonsa Overall, MD;  Location: [place];  Service: General;  Laterality: Left;  . Abdominal hysterectomy  25 yrs ago    partial  . Portacath placement N/A 01/12/2015    Procedure: INSERTION PORT-A-CATH;  Surgeon: Alphonsa Overall, MD;  Location: [facility];  Service: General;  Laterality: N/A;    SOCIAL HISTORY: Social History   Social History  . Marital Status: Widowed    Spouse Name: N/A  . Number of Children: 5  . Years of Education: N/A   Occupational History  . Textile work     Disabled   Social History Main Topics  . Smoking status: Former Smoker -- 1.00 packs/day for 10 years    Types: Cigarettes    Quit date: 03/03/1994  .  Smokeless tobacco: Never Used  . Alcohol Use: No  . Drug Use: No  . Sexual Activity: Not on file   Other Topics Concern  . Not on file   Social History Narrative   Lives with grandson.  Widowed.  Has 2 sons and 3 daughters  Widowed 79 years 75 children, oldest is 57 yo 8 grandchildren 1 great grandchild Worked in a factory She used to enjoy fishing and play softball. Ex smoker, quit 20 years ago. ETOH, none  FAMILY HISTORY: Family History  Problem Relation Age of Onset  . Heart attack Mother     had multiple health problems  . Lung cancer Father 31    smoker  . Prostate cancer Brother   .  Lung cancer Brother     dx. 5s; smoker  . Throat cancer Brother     dx. 78s; smoker  . Diabetes Sister     has 3 living sisters with multiple health problems  . Hypertension Brother   . Hypertension Son   . Hypertension Daughter   . Breast cancer Sister 31  . Breast cancer Sister     dx. 64s  . Breast cancer Maternal Grandmother     dx. 42s  . Breast cancer Maternal Aunt     dx. older than 35  . Dementia Maternal Aunt   . Breast cancer Cousin     dx. 44s   indicated that her mother is deceased. She indicated that her father is deceased. She indicated that two of her four sisters are alive. She indicated that all of her three brothers are deceased. She indicated that her maternal grandmother is deceased. She indicated that her maternal grandfather is deceased. She indicated that her paternal grandmother is deceased. She indicated that her paternal grandfather is deceased. She indicated that her daughter is alive. She indicated that only one of her two sons is alive. She indicated that both of her maternal aunts are deceased. She indicated that her maternal uncle is deceased. She indicated that her paternal aunt is deceased. She indicated that her cousin is alive.  Mother died at 45 of a heart attack, she had a large heart and a hole in her heart. Non-smoker Father died at 62 of lung  cancer, he was a smoker. 9 siblings, 4 living. One sister with breast cancer bilaterally, she is still living Another sister with breast cancer  ALLERGIES:  is allergic to lisinopril and penicillins.  MEDICATIONS:  Current Outpatient Prescriptions  Medication Sig Dispense Refill  . acetaminophen (TYLENOL) 500 MG tablet Take 1,000 mg by mouth every 6 (six) hours as needed for mild pain or headache.    . albuterol (PROVENTIL HFA;VENTOLIN HFA) 108 (90 BASE) MCG/ACT inhaler Inhale 2 puffs into the lungs every 4 (four) hours as needed for shortness of breath.    Marland Kitchen atorvastatin (LIPITOR) 20 MG tablet Take 20 mg by mouth every morning.     . bismuth subsalicylate (PEPTO BISMOL) 262 MG chewable tablet Chew 2 tablets (524 mg total) by mouth 2 (two) times daily. (Patient taking differently: Chew 524 mg by mouth 2 (two) times daily as needed for indigestion (acid reflux). ) 22 tablet 0  . Cholecalciferol (VITAMIN D-3) 5000 UNITS TABS Take 5,000 Units by mouth every morning.    . ferrous sulfate 325 (65 FE) MG tablet Take 1 tablet (325 mg total) by mouth every morning. (Patient taking differently: Take 325 mg by mouth 3 (three) times daily with meals. ) 30 tablet 0  . furosemide (LASIX) 20 MG tablet TAKE ONE TABLET BY MOUTH ONCE DAILY.    Marland Kitchen glipiZIDE (GLUCOTROL XL) 10 MG 24 hr tablet Take 10 mg by mouth daily with breakfast.     . HYDROcodone-acetaminophen (NORCO/VICODIN) 5-325 MG per tablet Take 1 tablet by mouth 2 (two) times daily as needed for moderate pain.     Marland Kitchen ketoconazole (NIZORAL) 2 % cream Reported on 03/01/2015  0  . lidocaine-prilocaine (EMLA) cream APPLY A QUARTER SIXE AMOUNT TO PORT SITE 1 HOUR PRIO TO CHEMO. DO NOT RUB IN. COVER W/ PLASTIC WRAP 30 g 3  . losartan (COZAAR) 100 MG tablet Take 100 mg by mouth daily.    . metFORMIN (GLUCOPHAGE-XR) 500 MG 24 hr tablet Take  500 mg by mouth 2 (two) times daily.     . metoprolol (TOPROL-XL) 100 MG 24 hr tablet Take 100 mg by mouth every morning.      . ondansetron (ZOFRAN) 8 MG tablet Take 1 tablet (8 mg total) by mouth every 8 (eight) hours as needed for nausea or vomiting. 30 tablet 3  . pantoprazole (PROTONIX) 40 MG tablet TAKE 1 TABLET (40 MG TOTAL) BY MOUTH DAILY. 90 tablet 0  . polyethylene glycol (MIRALAX / GLYCOLAX) packet Take 17 g by mouth daily as needed for mild constipation. 14 each 0  . potassium chloride SA (K-DUR,KLOR-CON) 20 MEQ tablet Take 1 tablet (20 mEq total) by mouth 2 (two) times daily. 60 tablet 1  . prochlorperazine (COMPAZINE) 10 MG tablet Take 1 tablet (10 mg total) by mouth every 6 (six) hours as needed for nausea or vomiting. 30 tablet 2  . Trastuzumab (HERCEPTIN IV) Inject into the vein. To be given weekly    . verapamil (CALAN-SR) 120 MG CR tablet Take 120 mg by mouth daily.     No current facility-administered medications for this visit.    Review of Systems  Constitutional: Negative.  Negative for fever, chills, weight loss and malaise/fatigue.  HENT: Negative.  Negative for congestion, hearing loss, nosebleeds, sore throat and tinnitus.   Eyes: Negative.  Negative for blurred vision, double vision, pain and discharge.  Respiratory: Negative.  Negative for cough, hemoptysis, sputum production, shortness of breath and wheezing.   Cardiovascular: Negative.  Negative for chest pain, palpitations, claudication, leg swelling and PND.  Gastrointestinal: Negative for heartburn, nausea, vomiting, abdominal pain, diarrhea, constipation, blood in stool and melena.  Genitourinary: Negative for dysuria, urgency, frequency and hematuria.  Musculoskeletal: Positive for joint pain. Negative for myalgias and falls.       Hurts mostly in her ankles and knees.  Skin: Negative.  Negative for itching and rash.  Neurological: Positive for tingling. Negative for dizziness, tremors, sensory change, speech change, focal weakness, seizures, loss of consciousness, weakness and headaches.       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    Endo/Heme/Allergies: Negative.  Does not bruise/bleed easily.  Psychiatric/Behavioral: Negative.  Negative for depression, suicidal ideas, memory loss and substance abuse. The patient is not nervous/anxious and does not have insomnia.   All other systems reviewed and are negative.  14 point ROS was done and is otherwise as detailed above or in HPI  PHYSICAL EXAMINATION: ECOG PERFORMANCE STATUS: 2 - Symptomatic, <50% confined to bed   Filed Vitals:   06/21/15 0800  BP: 172/72  Pulse: 85  Temp: 97.2 F (36.2 C)  Resp: 20   Filed Weights   06/21/15 0800  Weight: 269 lb (122.018 kg)    Physical Exam  Constitutional: She is oriented to person, place, and time and well-developed, well-nourished, and in no distress.  Obese, wearing head scarf  HENT:  Head: Normocephalic and atraumatic.  Nose: Nose normal.  Mouth/Throat: Oropharynx is clear and moist. No oropharyngeal exudate.  Eyes: Conjunctivae and EOM are normal. Pupils are equal, round, and reactive to light. Right eye exhibits no discharge. Left eye exhibits no discharge. No scleral icterus.  Neck: Normal range of motion. Neck supple. No tracheal deviation present. No thyromegaly present.  Cardiovascular: Normal rate and regular rhythm.  Exam reveals no gallop and no friction rub.   No murmur heard. Pulmonary/Chest: Effort normal and breath sounds normal. She has no wheezes. She has no rales.  Abdominal: Soft. Bowel sounds are  normal. She exhibits no distension and no mass. There is no tenderness. There is no rebound and no guarding.  Obese  Musculoskeletal: Normal range of motion. She exhibits no edema.  Lymphadenopathy:    She has no cervical adenopathy.  Neurological: She is alert and oriented to person, place, and time. No cranial nerve deficit. Coordination normal.  Skin: Skin is warm and dry. No rash noted.  XRT changes to the left breast  Psychiatric: Mood, memory, affect and judgment normal.  Nursing note and vitals  reviewed.   LABORATORY DATA:  I have reviewed the data as listed Lab Results  Component Value Date   WBC 3.2* 05/31/2015   HGB 10.5* 05/31/2015   HCT 33.9* 05/31/2015   MCV 90.4 05/31/2015   PLT 218 05/31/2015   CMP     Component Value Date/Time   NA 138 05/31/2015 0912   K 3.8 05/31/2015 0912   CL 102 05/31/2015 0912   CO2 28 05/31/2015 0912   GLUCOSE 145* 05/31/2015 0912   BUN 11 05/31/2015 0912   CREATININE 0.64 05/31/2015 0912   CALCIUM 8.8* 05/31/2015 0912   PROT 7.7 05/31/2015 0912   ALBUMIN 3.6 05/31/2015 0912   AST 29 05/31/2015 0912   ALT 23 05/31/2015 0912   ALKPHOS 109 05/31/2015 0912   BILITOT 0.4 05/31/2015 0912   GFRNONAA >60 05/31/2015 0912   GFRAA >60 05/31/2015 0912     RADIOLOGY: I have personally reviewed the radiological images as listed and agreed with the findings in the report.  Study Result    Result status: Final result     *[facility] [address], [place] [address]  [phone]  ------------------------------------------------------------------- Transthoracic Echocardiography  Patient: Olena, Willy MR #: [id number] Study Date: 06/13/2015 Gender: F Age: 70 Height: 161.3 cm Weight: 124.2 kg BSA: 2.43 m^2 Pt. Status: Room:  ATTENDING Virgina Norfolk PJKDTOI, ZTIWPY K REFERRING [facility] PERFORMING [facility], Forestine Na SONOGRAPHER Alvino Chapel, RCS  cc:  ------------------------------------------------------------------- LV EF: 60% - 65%  ------------------------------------------------------------------- Indications: Chemo V67.2.  ------------------------------------------------------------------- History: PMH: Left breast cancer with Chemotherapy, Obesity. Risk factors: Hypertension. Diabetes mellitus.  Dyslipidemia.  ------------------------------------------------------------------- Study Conclusions  - Left ventricle: The cavity size was normal. Wall thickness was  increased in a pattern of moderate LVH. Systolic function was  normal. The estimated ejection fraction was in the range of 60%  to 65%. Diastolic function is abnormal, indeterminate grade. Wall  motion was normal; there were no regional wall motion  abnormalities. - Aortic valve: Moderately calcified annulus. Trileaflet. There was  mild to moderate stenosis. Valve area (VTI): 1.13 cm^2. - Mitral valve: Mildly calcified annulus. Mildly thickened leaflets  . There was mild regurgitation. - Pulmonary arteries: Systolic pressure was mildly increased. PA  peak pressure: 34 mm Hg (S). - Technically adequate study.  Transthoracic echocardiography. M-mode, complete 2D, spectral Doppler, and color Doppler. Birthdate: Patient birthdate: Apr 24, 1945. Age: Patient is 70 yr old. Sex: Gender: female. BMI: 47.8 kg/m^2. Blood pressure: 131/52 Patient status: Inpatient. Study date: Study date: 06/13/2015. Study time: 11:45 AM. Location: Echo laboratory.  -------------------------------------------------------------------  ------------------------------------------------------------------- Left ventricle: The cavity size was normal. Wall thickness was increased in a pattern of moderate LVH. Systolic function was normal. The estimated ejection fraction was in the range of 60% to 65%. Diastolic function is abnormal, indeterminate grade. Wall motion was normal; there were no regional wall motion abnormalities.  ------------------------------------------------------------------- Aortic valve: Moderately calcified annulus. Trileaflet. Doppler:  There  was mild to moderate stenosis. There was no significant regurgitation. VTI ratio of LVOT to aortic valve: 0.4. Valve area (VTI): 1.13 cm^2. Indexed  valve area (VTI): 0.46 cm^2/m^2. Peak velocity ratio of LVOT to aortic valve: 0.35. Mean gradient (S): 18 mm Hg. Peak gradient (S): 35 mm Hg.  ------------------------------------------------------------------- Aorta: Aortic root: The aortic root was normal in size.  ------------------------------------------------------------------- Mitral valve: Mildly calcified annulus. Mildly thickened leaflets . Doppler: There was no evidence for stenosis. There was mild regurgitation. Mean gradient (D): 4 mm Hg. Peak gradient (D): 7 mm Hg.  ------------------------------------------------------------------- Left atrium: The atrium was normal in size.  ------------------------------------------------------------------- Atrial septum: No defect or patent foramen ovale was identified.  ------------------------------------------------------------------- Right ventricle: Poorly visualized. The cavity size was normal. Wall thickness was normal. Systolic function was normal.  ------------------------------------------------------------------- Pulmonic valve: Not well visualized. Doppler: There was no evidence for stenosis. There was mild regurgitation.  ------------------------------------------------------------------- Tricuspid valve: Normal thickness leaflets. Doppler: There was no evidence for stenosis. There was mild regurgitation.  ------------------------------------------------------------------- Pulmonary artery: Systolic pressure was mildly increased.  ------------------------------------------------------------------- Right atrium: Poorly visualized. The atrium was normal in size.  ------------------------------------------------------------------- Pericardium: There was no pericardial effusion.  ------------------------------------------------------------------- Systemic veins: Inferior vena cava: The vessel was normal in size.  The respirophasic diameter changes were in the normal range (>= 50%), consistent with normal central venous pressure.  ------------------------------------------------------------------- Measurements  Left ventricle Value Reference LV ID, ED, PLAX chordal 50.2 mm 43 - 52 LV ID, ES, PLAX chordal 32.9 mm 23 - 38 LV fx shortening, PLAX chordal 34 % >=29 LV PW thickness, ED 12 mm --------- IVS/LV PW ratio, ED 1.08 <=1.3  Ventricular septum Value Reference IVS thickness, ED 12.9 mm ---------  LVOT Value Reference LVOT ID, S 19 mm --------- LVOT area 2.84 cm^2 --------- LVOT peak velocity, S 103.92 cm/s --------- LVOT mean velocity, S 76.7 cm/s --------- LVOT VTI, S 26.05 cm ---------  Aortic valve Value Reference Aortic valve peak velocity, S 296 cm/s --------- Aortic valve mean velocity, S 193 cm/s --------- Aortic valve VTI, S 65.5 cm --------- Aortic mean gradient, S 18 mm Hg --------- Aortic peak gradient, S 35 mm Hg --------- VTI ratio, LVOT/AV 0.4 --------- Aortic valve area, VTI 1.13 cm^2 --------- Aortic valve area/bsa, VTI 0.46 cm^2/m^2 --------- Velocity ratio, peak, LVOT/AV 0.35 ---------  Aorta Value Reference Aortic root ID, ED 33 mm  ---------  Left atrium Value Reference LA ID, A-P, ES 48 mm --------- LA volume/bsa, S 26.8 ml/m^2 ---------  Mitral valve Value Reference Mitral E-wave peak velocity 135 cm/s --------- Mitral A-wave peak velocity 123 cm/s --------- Mitral mean velocity, D 94.48 cm/s --------- Mitral deceleration time (H) 283 ms 150 - 230 Mitral mean gradient, D 4 mm Hg --------- Mitral peak gradient, D 7 mm Hg --------- Mitral E/A ratio, peak 1.1 ---------  Pulmonary arteries Value Reference PA pressure, S, DP (H) 34 mm Hg <=30  Right ventricle Value Reference RV ID, ED, PLAX 26.5 mm 19 - 38 TAPSE 27 mm --------- RV s&', lateral, S 15.3 cm/s ---------  Legend: (L) and (H) mark values outside specified reference range.  ------------------------------------------------------------------- Prepared and Electronically Authenticated by  Kerry Hough, M.D. 2017-04-12T15:31:45     CLINICAL DATA: Status post port placement  EXAM: PORTABLE CHEST - 1 VIEW  COMPARISON: 12/08/2013  FINDINGS: Cardiac shadow remains enlarged. A new right-sided chest wall port is noted with the catheter tip at the cavoatrial junction. Downward depression of the catheter is noted at the first costoclavicular  space. Mild atelectasis is noted bilaterally due to a poor inspiratory effort. Some vascular crowding is noted as well. No pneumothorax is seen.  IMPRESSION: No evidence of pneumothorax.  Poor inspiratory  effort with bilateral atelectatic changes.  Downward depression of the catheter as described. This may predispose the catheter to potential pinch off syndrome.   Electronically Signed  By: Inez Catalina M.D.  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 She has almost completed XRT. She is here today for cycle #3 of Q3week herceptin. ECHO was reviewed with the patient. She has an upcoming bone density which we will review at follow-up. We will discuss AI therapy at her next appointment and get her started on treatment.  She was referred to PT for evaluation and management.   She will return in 3 weeks for a follow up and ongoing herceptin.   All questions were answered. The patient knows to call the clinic with any problems, questions or concerns.  This document serves as a record of services personally performed by Ancil Linsey, MD. It was created on her behalf by Kandace Blitz, a trained medical scribe. The creation of this record is based on the scribe's personal observations and the provider's statements to them. This document has been checked and approved by the attending provider.  I have reviewed the above documentation for accuracy and completeness, and I agree with the above.  This note was electronically signed.  Molli Hazard, MD  06/23/2015 5:12 PM

## 2015-06-25 ENCOUNTER — Other Ambulatory Visit (HOSPITAL_COMMUNITY): Payer: Self-pay | Admitting: Hematology & Oncology

## 2015-06-28 ENCOUNTER — Ambulatory Visit (HOSPITAL_COMMUNITY)
Admission: RE | Admit: 2015-06-28 | Discharge: 2015-06-28 | Disposition: A | Discharge status: Home / Self Care | Payer: Medicare Other | Source: Ambulatory Visit | Attending: Oncology | Admitting: Oncology

## 2015-06-28 DIAGNOSIS — C50412 Malignant neoplasm of upper-outer quadrant of left female breast: Secondary | ICD-10-CM

## 2015-06-28 DIAGNOSIS — Z780 Asymptomatic menopausal state: Secondary | ICD-10-CM | POA: Insufficient documentation

## 2015-07-12 ENCOUNTER — Encounter (HOSPITAL_COMMUNITY): Payer: Medicare Other | Attending: Hematology & Oncology

## 2015-07-12 ENCOUNTER — Encounter (HOSPITAL_BASED_OUTPATIENT_CLINIC_OR_DEPARTMENT_OTHER): Payer: Medicare Other | Admitting: Hematology & Oncology

## 2015-07-12 ENCOUNTER — Encounter (HOSPITAL_COMMUNITY): Payer: Self-pay | Admitting: Hematology & Oncology

## 2015-07-12 VITALS — BP 142/55 | HR 60 | Temp 97.5°F | Resp 18

## 2015-07-12 VITALS — BP 132/43 | HR 78 | Temp 97.7°F | Resp 16 | Wt 270.6 lb

## 2015-07-12 DIAGNOSIS — E119 Type 2 diabetes mellitus without complications: Secondary | ICD-10-CM

## 2015-07-12 DIAGNOSIS — D509 Iron deficiency anemia, unspecified: Secondary | ICD-10-CM | POA: Diagnosis present

## 2015-07-12 DIAGNOSIS — E559 Vitamin D deficiency, unspecified: Secondary | ICD-10-CM

## 2015-07-12 DIAGNOSIS — L738 Other specified follicular disorders: Secondary | ICD-10-CM | POA: Insufficient documentation

## 2015-07-12 DIAGNOSIS — M79605 Pain in left leg: Secondary | ICD-10-CM | POA: Diagnosis not present

## 2015-07-12 DIAGNOSIS — C773 Secondary and unspecified malignant neoplasm of axilla and upper limb lymph nodes: Secondary | ICD-10-CM

## 2015-07-12 DIAGNOSIS — E669 Obesity, unspecified: Secondary | ICD-10-CM | POA: Diagnosis not present

## 2015-07-12 DIAGNOSIS — E611 Iron deficiency: Secondary | ICD-10-CM | POA: Diagnosis not present

## 2015-07-12 DIAGNOSIS — D649 Anemia, unspecified: Secondary | ICD-10-CM | POA: Insufficient documentation

## 2015-07-12 DIAGNOSIS — M79604 Pain in right leg: Secondary | ICD-10-CM | POA: Diagnosis not present

## 2015-07-12 DIAGNOSIS — C50412 Malignant neoplasm of upper-outer quadrant of left female breast: Secondary | ICD-10-CM

## 2015-07-12 DIAGNOSIS — G620 Drug-induced polyneuropathy: Secondary | ICD-10-CM

## 2015-07-12 DIAGNOSIS — Z5112 Encounter for antineoplastic immunotherapy: Secondary | ICD-10-CM

## 2015-07-12 DIAGNOSIS — D6481 Anemia due to antineoplastic chemotherapy: Secondary | ICD-10-CM

## 2015-07-12 DIAGNOSIS — T451X5A Adverse effect of antineoplastic and immunosuppressive drugs, initial encounter: Secondary | ICD-10-CM

## 2015-07-12 LAB — CBC WITH DIFFERENTIAL/PLATELET
Basophils Absolute: 0 10*3/uL (ref 0.0–0.1)
Basophils Relative: 0 %
EOS ABS: 0.2 10*3/uL (ref 0.0–0.7)
Eosinophils Relative: 4 %
HEMATOCRIT: 35.6 % — AB (ref 36.0–46.0)
HEMOGLOBIN: 11.1 g/dL — AB (ref 12.0–15.0)
LYMPHS ABS: 1.1 10*3/uL (ref 0.7–4.0)
LYMPHS PCT: 25 %
MCH: 27 pg (ref 26.0–34.0)
MCHC: 31.2 g/dL (ref 30.0–36.0)
MCV: 86.6 fL (ref 78.0–100.0)
MONOS PCT: 7 %
Monocytes Absolute: 0.3 10*3/uL (ref 0.1–1.0)
NEUTROS ABS: 2.8 10*3/uL (ref 1.7–7.7)
NEUTROS PCT: 64 %
Platelets: 218 10*3/uL (ref 150–400)
RBC: 4.11 MIL/uL (ref 3.87–5.11)
RDW: 14.1 % (ref 11.5–15.5)
WBC: 4.3 10*3/uL (ref 4.0–10.5)

## 2015-07-12 LAB — COMPREHENSIVE METABOLIC PANEL
ALK PHOS: 105 U/L (ref 38–126)
ALT: 22 U/L (ref 14–54)
AST: 23 U/L (ref 15–41)
Albumin: 3.4 g/dL — ABNORMAL LOW (ref 3.5–5.0)
Anion gap: 7 (ref 5–15)
BILIRUBIN TOTAL: 0.4 mg/dL (ref 0.3–1.2)
BUN: 14 mg/dL (ref 6–20)
CALCIUM: 9 mg/dL (ref 8.9–10.3)
CO2: 28 mmol/L (ref 22–32)
CREATININE: 0.69 mg/dL (ref 0.44–1.00)
Chloride: 102 mmol/L (ref 101–111)
GFR calc non Af Amer: >60 mL/min (ref >60)
GLUCOSE: 156 mg/dL — AB (ref 65–99)
Potassium: 3.6 mmol/L (ref 3.5–5.1)
SODIUM: 137 mmol/L (ref 135–145)
TOTAL PROTEIN: 7.6 g/dL (ref 6.5–8.1)

## 2015-07-12 MED ORDER — ANASTROZOLE 1 MG PO TABS: 1.0000 mg | ORAL_TABLET | Freq: Every day | ORAL | Status: DC

## 2015-07-12 MED ORDER — ACETAMINOPHEN 325 MG PO TABS
650.0000 mg | ORAL_TABLET | Freq: Once | ORAL | Status: AC
  Administered 2015-07-12: 650 mg via ORAL
  Filled 2015-07-12: qty 2

## 2015-07-12 MED ORDER — HEPARIN SOD (PORK) LOCK FLUSH 100 UNIT/ML IV SOLN
500.0000 [IU] | Freq: Once | INTRAVENOUS | Status: AC | PRN
  Administered 2015-07-12: 500 [IU]
  Filled 2015-07-12: qty 5

## 2015-07-12 MED ORDER — SODIUM CHLORIDE 0.9 % IV SOLN
Freq: Once | INTRAVENOUS | Status: AC
  Administered 2015-07-12: 09:00:00 via INTRAVENOUS

## 2015-07-12 MED ORDER — DIPHENHYDRAMINE HCL 25 MG PO CAPS
50.0000 mg | ORAL_CAPSULE | Freq: Once | ORAL | Status: AC
  Administered 2015-07-12: 50 mg via ORAL
  Filled 2015-07-12: qty 2

## 2015-07-12 MED ORDER — TRASTUZUMAB CHEMO INJECTION 440 MG
6.0000 mg/kg | Freq: Once | INTRAVENOUS | Status: AC
  Administered 2015-07-12: 756 mg via INTRAVENOUS
  Filled 2015-07-12: qty 36

## 2015-07-12 MED ORDER — SODIUM CHLORIDE 0.9% FLUSH: 10.0000 mL | INTRAVENOUS | Status: DC | PRN

## 2015-07-12 NOTE — Progress Notes (Signed)
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 female is here for follow-up of her ER+, PR+ HER 2 neu + breast cancer. She is doing extremely well with therapy. She is on q 3 week herceptin.  Kristy Harris was here with her daughter and was in a wheelchair. She was here today for ongoing herceptin therapy.  Her legs have still been hurting her and it is making it hard for her to walk. At first she only had to use the cane in her house but now she has to use her walker wherever she goes. She said that her leg muscles feel weaker especially in the crease of her legs.  She is still able to get up and shower herself but it hurts a lot and is hard. We made a PT referral but she notes she never heard from them.   Other than this she feels pretty good.  She has mild tingling in her fingers and toes but notes it it not limiting.   She occasionally hears her pulse in her ears. This feels like there is something in her ears going "boom boom". This does not happen all the time. She notes however that this is new.   She is fatigued, but this is improving. Her taste is completely back to normal and she notes her appetite is too good.      Breast cancer of upper-outer quadrant of left female  breast ([place])   11/01/2014 Mammogram Possible mass in the left breast upper outer quadrant measuring 13 mm suspicious for breast cancer confirmed through ultrasound a spiculated hypoechoic mass ill-defined, no enlarged lymph nodes   11/01/2014 Initial Diagnosis Invasive ductal carcinoma, moderately differentiated, ER > 90%, PR> 90%, HER-2 -2+ by IHC, ratio 1.15, KI 67: 29%, T1 cN0 stage IA clinical stage   11/24/2014 Echocardiogram Systolic function was normal. The estimated ejection fraction was in the range of 55% to 60%.    12/25/2014 Surgery Left lumpectomy: IDC 1.7 cm, positive for LVI, with DCIS, 1/1 sentinel node positive deposit 1.9 cm with extracapsular extension, ER 90%, PR 90%, HER-2 positive ratio 2.4, Ki-67 29% T1 cN1 stage II a   02/01/2015 - 04/19/2015 Chemotherapy Abraxane/Herceptin   03/20/2015 Echocardiogram Systolic function wasnormal. The estimated ejection fraction was in the range of 60%to 65%.   05/08/2015 -  Radiation Therapy Dr. Lisbeth Renshaw (planned to be completed on 4/21)   05/10/2015 -  Antibody Plan Herceptin every 21 days   06/13/2015 Echocardiogram 2D echo- The estimated ejection fraction was in the range of 60% to 65%. Diastolic function is abnormal, indeterminate grade. Wallmotion was normal.   06/28/2015 Imaging Bone density- BMD as determined from Femur Total Left is 0.994 g/cm2 with a T-Score of -0.1. This patient is considered normal according to [facility]) criteria.      MEDICAL HISTORY:  Past Medical History  Diagnosis Date  . Diabetes mellitus   . Morbid obesity ([place])   . Recurrent boils     of perineal and buttocks  . Hypertension   . Hyperlipidemia   . COPD (chronic obstructive pulmonary disease) ([place])   . Enlarged heart   . Anemia   . Arthritis   . Heart murmur   . Urinary frequency   . Sleep apnea     no cpap used since weight loss  . H. pylori infection   . Diverticulosis april 2016  . Hidradenitis suppurativa   . PONV  (postoperative nausea and vomiting)   . Aortic stenosis     mild AS 11/24/14 echo  . Shortness of breath dyspnea     with exertion  . Pneumonia years ago  . GERD (gastroesophageal reflux disease)   . Breast cancer ([place]) 11/01/14    left breast     SURGICAL HISTORY: Past Surgical History  Procedure Laterality Date  . Pilonidal cyst excision    . Bladder suspension      x 2  . Abcess drainage      on bottom  . Cardiac catheterization  02/21/2011    Normal coronary arteries, normal EF  . Tonsillectomy  yrs ago  . Irrigation and debridement abscess Right 12/23/2012    Procedure: incision  AND DEBRIDEMENT right buttock infection ;  Surgeon: Shann Medal, MD;  Location: [facility];  Service: General;  Laterality: Right;  . Hydradenitis excision Left 12/08/2013    Procedure: EXCISION HIDRADENITIS AXILLA;  Surgeon: Alphonsa Overall, MD;  Location: [facility];  Service: General;  Laterality: Left;  . Excision hydradenitis labia N/A 12/08/2013    Procedure: EXCISION HIDRADENITIS PUBIC AREA;  Surgeon: Alphonsa Overall, MD;  Location: [facility];  Service: General;  Laterality: N/A;  . Breast lumpectomy with radioactive seed and sentinel lymph node biopsy Left 12/25/2014    Procedure: RADIOACTIVE SEED GIUDED LEFT BREAST LUMPECTOMY, LEFT AXILLARY SENTINEL LYMPH NODE BIOPSY;  Surgeon: Alphonsa Overall, MD;  Location: [facility];  Service: General;  Laterality: Left;  . Abdominal hysterectomy  25 yrs ago    partial  . Portacath placement N/A 01/12/2015    Procedure: INSERTION PORT-A-CATH;  Surgeon: Alphonsa Overall, MD;  Location: [facility];  Service: General;  Laterality: N/A;    SOCIAL HISTORY: Social History   Social History  . Marital Status: Widowed    Spouse Name: N/A  . Number of Children: 5  . Years of Education: N/A   Occupational History  . Textile work     Disabled   Social History Main Topics  . Smoking status: Former Smoker -- 1.00 packs/day for 10 years    Types: Cigarettes    Quit date: 03/03/1994  .  Smokeless tobacco: Never Used  . Alcohol Use: No  . Drug Use: No  . Sexual Activity: Not on file   Other Topics Concern  . Not on file   Social History Narrative   Lives with grandson.  Widowed.  Has 2 sons and 3 daughters  Widowed 27 years 68 children, oldest is 53 yo 8 grandchildren 1 great grandchild Worked in a factory She used to enjoy fishing and play softball. Ex smoker, quit 20 years ago. ETOH, none  FAMILY HISTORY: Family History  Problem Relation Age of Onset  . Heart attack Mother     had multiple health problems  . Lung cancer Father 70    smoker  . Prostate cancer Brother   . Lung cancer Brother  dx. 78s; smoker  . Throat cancer Brother     dx. 40s; smoker  . Diabetes Sister     has 3 living sisters with multiple health problems  . Hypertension Brother   . Hypertension Son   . Hypertension Daughter   . Breast cancer Sister 66  . Breast cancer Sister     dx. 61s  . Breast cancer Maternal Grandmother     dx. 23s  . Breast cancer Maternal Aunt     dx. older than 62  . Dementia Maternal Aunt   . Breast cancer Cousin     dx. 76s   indicated that her mother is deceased. She indicated that her father is deceased. She indicated that two of her four sisters are alive. She indicated that all of her three brothers are deceased. She indicated that her maternal grandmother is deceased. She indicated that her maternal grandfather is deceased. She indicated that her paternal grandmother is deceased. She indicated that her paternal grandfather is deceased. She indicated that her daughter is alive. She indicated that only one of her two sons is alive. She indicated that both of her maternal aunts are deceased. She indicated that her maternal uncle is deceased. She indicated that her paternal aunt is deceased. She indicated that her cousin is alive.  Mother died at 25 of a heart attack, she had a large heart and a hole in her heart. Non-smoker Father died at 60 of lung  cancer, he was a smoker. 9 siblings, 4 living. One sister with breast cancer bilaterally, she is still living Another sister with breast cancer  ALLERGIES:  is allergic to lisinopril and penicillins.  MEDICATIONS:  Current Outpatient Prescriptions  Medication Sig Dispense Refill  . acetaminophen (TYLENOL) 500 MG tablet Take 1,000 mg by mouth every 6 (six) hours as needed for mild pain or headache.    . albuterol (PROVENTIL HFA;VENTOLIN HFA) 108 (90 BASE) MCG/ACT inhaler Inhale 2 puffs into the lungs every 4 (four) hours as needed for shortness of breath.    Marland Kitchen atorvastatin (LIPITOR) 20 MG tablet Take 20 mg by mouth every morning.     . bismuth subsalicylate (PEPTO BISMOL) 262 MG chewable tablet Chew 2 tablets (524 mg total) by mouth 2 (two) times daily. (Patient taking differently: Chew 524 mg by mouth 2 (two) times daily as needed for indigestion (acid reflux). ) 22 tablet 0  . ferrous sulfate 325 (65 FE) MG tablet Take 1 tablet (325 mg total) by mouth every morning. (Patient taking differently: Take 325 mg by mouth 3 (three) times daily with meals. ) 30 tablet 0  . furosemide (LASIX) 20 MG tablet TAKE ONE TABLET BY MOUTH ONCE DAILY.    Marland Kitchen glipiZIDE (GLUCOTROL XL) 10 MG 24 hr tablet Take 10 mg by mouth daily with breakfast.     . HYDROcodone-acetaminophen (NORCO/VICODIN) 5-325 MG per tablet Take 1 tablet by mouth 2 (two) times daily as needed for moderate pain.     Marland Kitchen KLOR-CON M20 20 MEQ tablet TAKE 1 TABLET (20 MEQ TOTAL) BY MOUTH DAILY. 30 tablet 2  . lidocaine-prilocaine (EMLA) cream APPLY A QUARTER SIXE AMOUNT TO PORT SITE 1 HOUR PRIO TO CHEMO. DO NOT RUB IN. COVER W/ PLASTIC WRAP 30 g 3  . losartan (COZAAR) 100 MG tablet Take 100 mg by mouth daily.    . metFORMIN (GLUCOPHAGE-XR) 500 MG 24 hr tablet Take 500 mg by mouth 2 (two) times daily.     . metoprolol (TOPROL-XL) 100 MG  24 hr tablet Take 100 mg by mouth every morning.     . pantoprazole (PROTONIX) 40 MG tablet TAKE 1 TABLET (40 MG  TOTAL) BY MOUTH DAILY. 90 tablet 0  . polyethylene glycol (MIRALAX / GLYCOLAX) packet Take 17 g by mouth daily as needed for mild constipation. 14 each 0  . Trastuzumab (HERCEPTIN IV) Inject into the vein. To be given weekly    . verapamil (CALAN-SR) 120 MG CR tablet Take 120 mg by mouth daily.    Marland Kitchen anastrozole (ARIMIDEX) 1 MG tablet Take 1 tablet (1 mg total) by mouth daily. 30 tablet 2  . Cholecalciferol (VITAMIN D-3) 5000 UNITS TABS Take 5,000 Units by mouth every morning. Reported on 07/12/2015    . ketoconazole (NIZORAL) 2 % cream Reported on 07/12/2015  0  . ondansetron (ZOFRAN) 8 MG tablet Take 1 tablet (8 mg total) by mouth every 8 (eight) hours as needed for nausea or vomiting. (Patient not taking: Reported on 07/12/2015) 30 tablet 3  . prochlorperazine (COMPAZINE) 10 MG tablet Take 1 tablet (10 mg total) by mouth every 6 (six) hours as needed for nausea or vomiting. (Patient not taking: Reported on 07/12/2015) 30 tablet 2   No current facility-administered medications for this visit.   Facility-Administered Medications Ordered in Other Visits  Medication Dose Route Frequency Provider Last Rate Last Dose  . sodium chloride flush (NS) 0.9 % injection 10 mL  10 mL Intracatheter PRN Patrici Ranks, MD        Review of Systems  Constitutional: Positive for malaise/fatigue. Negative for fever, chills and weight loss.  HENT: Negative.  Negative for congestion, hearing loss, nosebleeds, sore throat and tinnitus.        Hears her pulse in her ears occasionally. Feels like something is in her ear.   Eyes: Negative.  Negative for blurred vision, double vision, pain and discharge.  Respiratory: Negative.  Negative for cough, hemoptysis, sputum production, shortness of breath and wheezing.   Cardiovascular: Negative.  Negative for chest pain, palpitations, claudication, leg swelling and PND.  Gastrointestinal: Negative for heartburn, nausea, vomiting, abdominal pain, diarrhea, constipation, blood  in stool and melena.  Genitourinary: Negative for dysuria, urgency, frequency and hematuria.  Musculoskeletal: Positive for joint pain. Negative for myalgias and falls.       Muscles in legs feel weaker. Has to use walker to walk. In crease of leg hurts. Legs are sore.  Skin: Negative.  Negative for itching and rash.  Neurological: Positive for tingling. Negative for dizziness, tremors, sensory change, speech change, focal weakness, seizures, loss of consciousness, weakness and headaches.       Neuropathy in fingers and toes. Feels numbness  Endo/Heme/Allergies: Negative.  Does not bruise/bleed easily.  Psychiatric/Behavioral: Negative.  Negative for depression, suicidal ideas, memory loss and substance abuse. The patient is not nervous/anxious and does not have insomnia.   All other systems reviewed and are negative.  14 point ROS was done and is otherwise as detailed above or in HPI  PHYSICAL EXAMINATION: ECOG PERFORMANCE STATUS: 2 - Symptomatic, <50% confined to bed   Filed Vitals:   07/12/15 0818  BP: 132/43  Pulse: 78  Temp: 97.7 F (36.5 C)  Resp: 16   Filed Weights   07/12/15 0818  Weight: 270 lb 9.6 oz (122.743 kg)    Physical Exam  Constitutional: She is oriented to person, place, and time and well-developed, well-nourished, and in no distress.  In wheelchair. Hair regrowth noted  HENT:  Head: Normocephalic  and atraumatic.  Nose: Nose normal.  Mouth/Throat: Oropharynx is clear and moist. No oropharyngeal exudate.  Eyes: Conjunctivae and EOM are normal. Pupils are equal, round, and reactive to light. Right eye exhibits no discharge. Left eye exhibits no discharge. No scleral icterus.  Neck: Normal range of motion. Neck supple. No tracheal deviation present. No thyromegaly present.  Cardiovascular: Normal rate and regular rhythm.  Exam reveals no gallop and no friction rub.   No murmur heard. Pulmonary/Chest: Effort normal and breath sounds normal. She has no wheezes.  She has no rales.  Abdominal: Soft. Bowel sounds are normal. She exhibits no distension and no mass. There is no tenderness. There is no rebound and no guarding.  Obese  Musculoskeletal: Normal range of motion. She exhibits no edema.  Lymphadenopathy:    She has no cervical adenopathy.  Neurological: She is alert and oriented to person, place, and time. No cranial nerve deficit. Coordination normal.  Skin: Skin is warm and dry. No rash noted.  Psychiatric: Mood, memory, affect and judgment normal.  Nursing note and vitals reviewed.   LABORATORY DATA:  I have reviewed the data as listed Lab Results  Component Value Date   WBC 4.3 07/12/2015   HGB 11.1* 07/12/2015   HCT 35.6* 07/12/2015   MCV 86.6 07/12/2015   PLT 218 07/12/2015   CMP     Component Value Date/Time   NA 137 07/12/2015 0940   K 3.6 07/12/2015 0940   CL 102 07/12/2015 0940   CO2 28 07/12/2015 0940   GLUCOSE 156* 07/12/2015 0940   BUN 14 07/12/2015 0940   CREATININE 0.69 07/12/2015 0940   CALCIUM 9.0 07/12/2015 0940   PROT 7.6 07/12/2015 0940   ALBUMIN 3.4* 07/12/2015 0940   AST 23 07/12/2015 0940   ALT 22 07/12/2015 0940   ALKPHOS 105 07/12/2015 0940   BILITOT 0.4 07/12/2015 0940   GFRNONAA >60 07/12/2015 0940   GFRAA >60 07/12/2015 0940     RADIOLOGY: I have personally reviewed the radiological images as listed and agreed with the findings in the report.   Study Result     EXAM: DUAL X-RAY ABSORPTIOMETRY (DXA) FOR BONE MINERAL DENSITY  IMPRESSION: Ordering Physician: Dr. Baird Cancer,  Your patient Keyonta Madrid completed a BMD test on 06/28/2015 using the [employer] (software version: 14.10) manufactured by [employer]. The following summarizes the results of our evaluation. PATIENT BIOGRAPHICAL: Name: ALEXIZ, COTHRAN Patient ID: [id number] Birth Date: 04-Sep-1945 Height: 63.5 in. Gender: Female Exam Date: 06/28/2015 Weight: 269.0 lbs. Indications: Breast  Ca, Height Loss, Low Calcium Intake, Partial Hysterectomy, Post Menopausal Fractures: Treatments: Vitamin D DENSITOMETRY RESULTS: Site Region Measured Date Measured Age WHO Classification Young Adult T-score BMD %Change vs. Previous Significant Change (*) DualFemur Total Left 06/28/2015 67.6 Normal -0.1 0.994 g/cm2 - -  Left Forearm Radius 33% 06/28/2015 67.6 Normal 1.6 0.830 g/cm2 - - ASSESSMENT: BMD as determined from Femur Total Left is 0.994 g/cm2 with a T-Score of -0.1. This patient is considered normal according to [facility]) criteria. (Lumbar spine was not utilized due to advanced degenerative changes.)  World Pharmacologist (WHO) criteria for post-menopausal, Caucasian Women: Normal: T-score at or above -1 SD Osteopenia: T-score between -1 and -2.5 SD Osteoporosis: T-score at or below -2.5 SD  RECOMMENDATIONS: [place] recommends that FDA-approved medial therapies be considered in postmenopausal women and men age 65 or older with a: 1. Hip or vertebral (clinical or morphometric) fracture. 2.  T-Score of < -2.5 at the spine or hip. 3. Ten-year fracture probability by FRAX of 3% or greater for hip fracture or 20% or greater for major osteoporotic fracture.  All treatment decisions require clinical judgment and consideration of individual patient factors, including patient preferences, co-morbidities, previous drug use, risk factors not captured in the FRAX model (e.g. falls, vitamin D deficiency, increased bone turnover, interval significant decline in bone density) and possible under-or over-estimation of fracture risk by FRAX.  All patients should ensure an adequate intake of dietary calcium (1200 mg/d) and vitamin D (800 IU daily) unless contraindicated.  FOLLOW-UP: People with diagnosed cases of osteoporosis or osteopenia should be regularly tested for bone mineral density. For  patients eligible for Medicare, routine testing is allowed once every 2 years. Testing frequency can be increased for patients who have rapidly progressing disease, or for those who are receiving medical therapy to restore bone mass.  I have reviewed this report, and agree with the above findings.  [facility]   Electronically Signed  By: David Martinique M.D.  On: 06/28/2015 11:28   Study Result    Result status: Final result     *[place], [place] [address]  [phone]  ------------------------------------------------------------------- Transthoracic Echocardiography  Patient: Arlen, Legendre MR #: [id number] Study Date: 06/13/2015 Gender: F Age: 71 Height: 161.3 cm Weight: 124.2 kg BSA: 2.43 m^2 Pt. Status: Room:  ATTENDING Virgina [place] [id number], LOVFIE P REFERRING Baird Cancer PERFORMING [facility], Forestine Na SONOGRAPHER Alvino Chapel, RCS  cc:  ------------------------------------------------------------------- LV EF: 60% - 65%  ------------------------------------------------------------------- Indications: Chemo V67.2.  ------------------------------------------------------------------- History: PMH: Left breast cancer with Chemotherapy, Obesity. Risk factors: Hypertension. Diabetes mellitus. Dyslipidemia.  ------------------------------------------------------------------- Study Conclusions  - Left ventricle: The cavity size was normal. Wall thickness was  increased in a pattern of moderate LVH. Systolic function was  normal. The estimated ejection fraction was in the range of 60%  to 65%. Diastolic function is abnormal, indeterminate grade. Wall  motion was normal; there were no regional wall motion   abnormalities. - Aortic valve: Moderately calcified annulus. Trileaflet. There was  mild to moderate stenosis. Valve area (VTI): 1.13 cm^2. - Mitral valve: Mildly calcified annulus. Mildly thickened leaflets  . There was mild regurgitation. - Pulmonary arteries: Systolic pressure was mildly increased. PA  peak pressure: 34 mm Hg (S). - Technically adequate study.  Transthoracic echocardiography. M-mode, complete 2D, spectral Doppler, and color Doppler. Birthdate: Patient birthdate: [birth year]. Age: Patient is 70 yr old. Sex: Gender: female. BMI: 47.8 kg/m^2. Blood pressure: 131/52 Patient status: Inpatient. Study date: Study date: 06/13/2015. Study time: 11:45 AM. Location: Echo laboratory.  -------------------------------------------------------------------  ------------------------------------------------------------------- Left ventricle: The cavity size was normal. Wall thickness was increased in a pattern of moderate LVH. Systolic function was normal. The estimated ejection fraction was in the range of 60% to 65%. Diastolic function is abnormal, indeterminate grade. Wall motion was normal; there were no regional wall motion abnormalities.  ------------------------------------------------------------------- Aortic valve: Moderately calcified annulus. Trileaflet. Doppler:  There was mild to moderate stenosis. There was no significant regurgitation. VTI ratio of LVOT to aortic valve: 0.4. Valve area (VTI): 1.13 cm^2. Indexed valve area (VTI): 0.46 cm^2/m^2. Peak velocity ratio of LVOT to aortic valve: 0.35. Mean gradient (S): 18 mm Hg. Peak gradient (S): 35 mm Hg.  ------------------------------------------------------------------- Aorta: Aortic root: The aortic root was normal in size.  ------------------------------------------------------------------- Mitral valve: Mildly calcified annulus. Mildly thickened leaflets . Doppler:  There was no evidence for stenosis. There was mild regurgitation. Mean gradient (D): 4 mm Hg. Peak gradient (D): 7 mm Hg.  ------------------------------------------------------------------- Left atrium: The atrium was normal in size.  ------------------------------------------------------------------- Atrial septum: No defect or patent foramen ovale was identified.  ------------------------------------------------------------------- Right ventricle: Poorly visualized. The cavity size was normal. Wall thickness was normal. Systolic function was normal.  ------------------------------------------------------------------- Pulmonic valve: Not well visualized. Doppler: There was no evidence for stenosis. There was mild regurgitation.  ------------------------------------------------------------------- Tricuspid valve: Normal thickness leaflets. Doppler: There was no evidence for stenosis. There was mild regurgitation.  ------------------------------------------------------------------- Pulmonary artery: Systolic pressure was mildly increased.  ------------------------------------------------------------------- Right atrium: Poorly visualized. The atrium was normal in size.  ------------------------------------------------------------------- Pericardium: There was no pericardial effusion.  ------------------------------------------------------------------- Systemic veins: Inferior vena cava: The vessel was normal in size. The respirophasic diameter changes were in the normal range (>= 50%), consistent with normal central venous pressure.  ------------------------------------------------------------------- Measurements  Left ventricle Value Reference LV ID, ED, PLAX chordal 50.2 mm 43 - 52 LV ID, ES, PLAX chordal 32.9 mm 23 - 38 LV fx shortening, PLAX  chordal 34 % >=29 LV PW thickness, ED 12 mm --------- IVS/LV PW ratio, ED 1.08 <=1.3  Ventricular septum Value Reference IVS thickness, ED 12.9 mm ---------  LVOT Value Reference LVOT ID, S 19 mm --------- LVOT area 2.84 cm^2 --------- LVOT peak velocity, S 103.92 cm/s --------- LVOT mean velocity, S 76.7 cm/s --------- LVOT VTI, S 26.05 cm ---------  Aortic valve Value Reference Aortic valve peak velocity, S 296 cm/s --------- Aortic valve mean velocity, S 193 cm/s --------- Aortic valve VTI, S 65.5 cm --------- Aortic mean gradient, S 18 mm Hg --------- Aortic peak gradient, S 35 mm Hg --------- VTI ratio, LVOT/AV 0.4 --------- Aortic valve area, VTI 1.13 cm^2 --------- Aortic valve area/bsa, VTI 0.46 cm^2/m^2 --------- Velocity ratio, peak, LVOT/AV 0.35 ---------  Aorta Value Reference Aortic root ID, ED 33 mm ---------  Left atrium Value Reference LA ID, A-P, ES 48 mm --------- LA volume/bsa, S 26.8 ml/m^2 ---------  Mitral valve Value Reference Mitral E-wave peak velocity 135 cm/s --------- Mitral A-wave peak velocity 123 cm/s --------- Mitral mean  velocity, D 94.48 cm/s --------- Mitral deceleration time (H) 283 ms 150 - 230 Mitral mean gradient, D 4 mm Hg --------- Mitral peak gradient, D 7 mm Hg --------- Mitral E/A ratio, peak 1.1 ---------  Pulmonary arteries Value Reference PA pressure, S, DP (H) 34 mm Hg <=30  Right ventricle Value Reference RV ID, ED, PLAX 26.5 mm 19 - 38 TAPSE 27 mm --------- RV s&', lateral, S 15.3 cm/s ---------  Legend: (L) and (H) mark values outside specified reference range.  ------------------------------------------------------------------- Prepared and Electronically Authenticated by  Kerry Hough, M.D. 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 She has completed XRT. She is here today for ongoing Q3week herceptin. ECHO was reviewed with the patient. DEXA was reviewed. She has normal bone density. She was counseled to take calcium 1200 mg daily and vitamin D at least 1000 to 2000 IU daily.   We discuss AI Therapy in detail. We discussed the risks and benefits of anti-estrogen therapy with aromatase inhibitors.  These include but not limited to insomnia, hot flashes, mood changes, vaginal dryness, bone density loss, and weight gain. Although rare, serious side effects including endometrial cancer,  risk of blood clots were also discussed. We strongly believe that the benefits far outweigh the risks. Patient understands these risks and consented to starting treatment. Planned treatment duration is 5 years.  She was referred to PT for evaluation and management. A prescription for Arimidex was called into her pharmacy.    She will return in 3 weeks for a follow up and ongoing herceptin.  All questions were answered. The patient knows to call the clinic with any problems, questions or concerns.  This document serves as a record of services personally performed by Ancil Linsey, MD. It was created on her behalf by Kandace Blitz, a trained medical scribe. The creation of this record is based on the scribe's personal observations and the provider's statements to them. This document has been checked and approved by the attending provider.  I have reviewed the above documentation for accuracy and completeness, and I agree with the above.  This note was electronically signed.  Molli Hazard, MD  07/12/2015 11:41 AM

## 2015-07-12 NOTE — Patient Instructions (Addendum)
[place] [facility] Discharge Instructions  RECOMMENDATIONS MADE BY THE CONSULTANT AND ANY TEST RESULTS WILL BE SENT TO YOUR REFERRING PHYSICIAN.  Physical therapy referral for help with ambulation I will send a prescription to your drug store for arimidex You also need to take  vitamin D 2000 units a day and calcium 1200 mg daily - these are over the counter  Your bone density was good  Thank you for choosing [place] [facility] to provide your oncology and hematology care.  To afford each patient quality time with our provider, please arrive at least 15 minutes before your scheduled appointment time.   Beginning January 23rd 2017 lab work for the [facility] will be done in the  Main lab at [facility] on 1st floor. If you have a lab appointment with the [facility] please come in thru the  Main Entrance and check in at the main information desk  You need to re-schedule your appointment should you arrive 10 or more minutes late.  We strive to give you quality time with our providers, and arriving late affects you and other patients whose appointments are after yours.  Also, if you no show three or more times for appointments you may be dismissed from the clinic at the providers discretion.     Again, thank you for choosing [facility].  Our hope is that these requests will decrease the amount of time that you wait before being seen by our physicians.       _____________________________________________________________  Should you have questions after your visit to [facility], please contact our office at [phone] between the hours of 8:30 a.m. and 4:30 p.m.  Voicemails left after 4:30 p.m. will not be returned until the following business day.  For prescription refill requests, have your pharmacy contact our office.         Resources For Cancer Patients and their Caregivers ? American Cancer  Society: Can assist with transportation, wigs, general needs, runs Look Good Feel Better.        219-853-3858 ? Cancer Care: Provides financial assistance, online support groups, medication/co-pay assistance.  1-800-813-HOPE 207-152-4352) ? [facility] Assists [place] cancer patients and their families through emotional , educational and financial support.  (863)048-3407 ? [place] DSS Where to apply for food stamps, Medicaid and utility assistance. 213-683-3459 ? [employer]: Transportation to medical appointments. 352-050-2165 ? Social Security Administration: May apply for disability if have a Stage IV cancer. (431)820-1328 8721641032 ? [employer], Disability and Transit Services: Assists with nutrition, care and transit needs. [place] Support Programs: @10RELATIVEDAYS @ > Cancer Support Group  2nd Tuesday of the month 1pm-2pm, Journey Room  > Creative Journey  3rd Tuesday of the month 1130am-1pm, Journey Room  > Look Good Feel Better  1st Wednesday of the month 10am-12 noon, Journey Room (Call [place] to register [phone])

## 2015-07-12 NOTE — Patient Instructions (Signed)
[facility] Discharge Instructions for Patients Receiving Chemotherapy   Beginning January 23rd 2017 lab work for the Cancer Center will be done in the  Main lab at [facility] on 1st floor. If you have a lab appointment with the Cancer Center please come in thru the  Main Entrance and check in at the main information desk   Today you received the following chemotherapy agent: Herceptin   If you develop nausea and vomiting, or diarrhea that is not controlled by your medication, call the clinic.  The clinic phone number is [phone]. Office hours are Monday-Friday 8:30am-5:00pm.  BELOW ARE SYMPTOMS THAT SHOULD BE REPORTED IMMEDIATELY:  *FEVER GREATER THAN 101.0 F  *CHILLS WITH OR WITHOUT FEVER  NAUSEA AND VOMITING THAT IS NOT CONTROLLED WITH YOUR NAUSEA MEDICATION  *UNUSUAL SHORTNESS OF BREATH  *UNUSUAL BRUISING OR BLEEDING  TENDERNESS IN MOUTH AND THROAT WITH OR WITHOUT PRESENCE OF ULCERS  *URINARY PROBLEMS  *BOWEL PROBLEMS  UNUSUAL RASH Items with * indicate a potential emergency and should be followed up as soon as possible. If you have an emergency after office hours please contact your primary care physician or go to the nearest emergency department.  Please call the clinic during office hours if you have any questions or concerns.   You may also contact the Patient Navigator at [phone] should you have any questions or need assistance in obtaining follow up care.      Resources For Cancer Patients and their Caregivers ? American Cancer Society: Can assist with transportation, wigs, general needs, runs Look Good Feel Better.        1-888-227-6333 ? Cancer Care: Provides financial assistance, online support groups, medication/co-pay assistance.  1-800-813-HOPE (4673) ? [facility] Assists [place] cancer patients and their families through emotional , educational and financial support.   336-427-4357 ? [place] DSS Where to apply for food stamps, Medicaid and utility assistance. 336-342-1394 ? [employer]: Transportation to medical appointments. 336-347-2287 ? Social Security Administration: May apply for disability if have a Stage IV cancer. 336-342-7796 1-800-772-1213 ? [place] Aging, Disability and Transit Services: Assists with nutrition, care and transit needs. 336-349-2343          

## 2015-07-12 NOTE — Progress Notes (Signed)
Patient tolerated infusion well.  VSS.   

## 2015-07-13 ENCOUNTER — Other Ambulatory Visit (HOSPITAL_COMMUNITY): Payer: Self-pay | Admitting: Oncology

## 2015-07-13 DIAGNOSIS — E559 Vitamin D deficiency, unspecified: Secondary | ICD-10-CM

## 2015-07-13 LAB — VITAMIN D 25 HYDROXY (VIT D DEFICIENCY, FRACTURES): VIT D 25 HYDROXY: 25.4 ng/mL — AB (ref 30.0–100.0)

## 2015-07-13 MED ORDER — ERGOCALCIFEROL 1.25 MG (50000 UT) PO CAPS: 50000.0000 [IU] | ORAL_CAPSULE | ORAL | Status: DC

## 2015-08-02 ENCOUNTER — Encounter (HOSPITAL_COMMUNITY): Payer: Self-pay | Admitting: Hematology & Oncology

## 2015-08-02 ENCOUNTER — Encounter (HOSPITAL_BASED_OUTPATIENT_CLINIC_OR_DEPARTMENT_OTHER): Payer: Medicare Other | Admitting: Hematology & Oncology

## 2015-08-02 ENCOUNTER — Encounter (HOSPITAL_COMMUNITY): Payer: Medicare Other | Attending: Hematology & Oncology

## 2015-08-02 VITALS — BP 140/50 | HR 82 | Temp 97.9°F | Resp 20 | Wt 267.3 lb

## 2015-08-02 DIAGNOSIS — Z5112 Encounter for antineoplastic immunotherapy: Secondary | ICD-10-CM

## 2015-08-02 DIAGNOSIS — D6481 Anemia due to antineoplastic chemotherapy: Secondary | ICD-10-CM | POA: Diagnosis not present

## 2015-08-02 DIAGNOSIS — D509 Iron deficiency anemia, unspecified: Secondary | ICD-10-CM | POA: Insufficient documentation

## 2015-08-02 DIAGNOSIS — L738 Other specified follicular disorders: Secondary | ICD-10-CM | POA: Insufficient documentation

## 2015-08-02 DIAGNOSIS — C50412 Malignant neoplasm of upper-outer quadrant of left female breast: Secondary | ICD-10-CM | POA: Diagnosis present

## 2015-08-02 DIAGNOSIS — E119 Type 2 diabetes mellitus without complications: Secondary | ICD-10-CM | POA: Diagnosis not present

## 2015-08-02 DIAGNOSIS — Z79899 Other long term (current) drug therapy: Secondary | ICD-10-CM

## 2015-08-02 DIAGNOSIS — E669 Obesity, unspecified: Secondary | ICD-10-CM

## 2015-08-02 DIAGNOSIS — D649 Anemia, unspecified: Secondary | ICD-10-CM | POA: Insufficient documentation

## 2015-08-02 DIAGNOSIS — E611 Iron deficiency: Secondary | ICD-10-CM | POA: Diagnosis not present

## 2015-08-02 MED ORDER — TRASTUZUMAB CHEMO INJECTION 440 MG
6.0000 mg/kg | Freq: Once | INTRAVENOUS | Status: AC
  Administered 2015-08-02: 756 mg via INTRAVENOUS
  Filled 2015-08-02: qty 36

## 2015-08-02 MED ORDER — DIPHENHYDRAMINE HCL 25 MG PO CAPS
50.0000 mg | ORAL_CAPSULE | Freq: Once | ORAL | Status: AC
Start: 2015-08-02 — End: 2015-08-02
  Administered 2015-08-02: 50 mg via ORAL
  Filled 2015-08-02: qty 2

## 2015-08-02 MED ORDER — HEPARIN SOD (PORK) LOCK FLUSH 100 UNIT/ML IV SOLN
500.0000 [IU] | Freq: Once | INTRAVENOUS | Status: AC | PRN
  Administered 2015-08-02: 500 [IU]
  Filled 2015-08-02: qty 5

## 2015-08-02 MED ORDER — DIPHENHYDRAMINE HCL 25 MG PO CAPS
ORAL_CAPSULE | ORAL | Status: AC
  Filled 2015-08-02: qty 1

## 2015-08-02 MED ORDER — SODIUM CHLORIDE 0.9% FLUSH
10.0000 mL | INTRAVENOUS | Status: DC | PRN
  Administered 2015-08-02: 10 mL
  Filled 2015-08-02: qty 10

## 2015-08-02 MED ORDER — SODIUM CHLORIDE 0.9 % IV SOLN
Freq: Once | INTRAVENOUS | Status: AC
  Administered 2015-08-02: 10:00:00 via INTRAVENOUS

## 2015-08-02 MED ORDER — ACETAMINOPHEN 325 MG PO TABS
650.0000 mg | ORAL_TABLET | Freq: Once | ORAL | Status: AC
  Administered 2015-08-02: 650 mg via ORAL
  Filled 2015-08-02: qty 2

## 2015-08-02 NOTE — Patient Instructions (Signed)
[facility] Discharge Instructions for Patients Receiving Chemotherapy   Beginning January 23rd 2017 lab work for the Cancer Center will be done in the  Main lab at [place] on 1st floor. If you have a lab appointment with the Cancer Center please come in thru the  Main Entrance and check in at the main information desk   Today you received the following chemotherapy agents:  herceptin    If you develop nausea and vomiting, or diarrhea that is not controlled by your medication, call the clinic.  The clinic phone number is [phone]. Office hours are Monday-Friday 8:30am-5:00pm.  BELOW ARE SYMPTOMS THAT SHOULD BE REPORTED IMMEDIATELY:  *FEVER GREATER THAN 101.0 F  *CHILLS WITH OR WITHOUT FEVER  NAUSEA AND VOMITING THAT IS NOT CONTROLLED WITH YOUR NAUSEA MEDICATION  *UNUSUAL SHORTNESS OF BREATH  *UNUSUAL BRUISING OR BLEEDING  TENDERNESS IN MOUTH AND THROAT WITH OR WITHOUT PRESENCE OF ULCERS  *URINARY PROBLEMS  *BOWEL PROBLEMS  UNUSUAL RASH Items with * indicate a potential emergency and should be followed up as soon as possible. If you have an emergency after office hours please contact your primary care physician or go to the nearest emergency department.  Please call the clinic during office hours if you have any questions or concerns.   You may also contact the Patient Navigator at [phone] should you have any questions or need assistance in obtaining follow up care.      Resources For Cancer Patients and their Caregivers ? American Cancer Society: Can assist with transportation, wigs, general needs, runs Look Good Feel Better.        1-888-227-6333 ? Cancer Care: Provides financial assistance, online support groups, medication/co-pay assistance.  1-800-813-HOPE (4673) ? [facility] Assists [place] cancer patients and their families through emotional , educational and financial support.   336-427-4357 ? [place] DSS Where to apply for food stamps, Medicaid and utility assistance. 336-342-1394 ? [employer]: Transportation to medical appointments. 336-347-2287 ? Social Security Administration: May apply for disability if have a Stage IV cancer. 336-342-7796 1-800-772-1213 ? [place] Aging, Disability and Transit Services: Assists with nutrition, care and transit needs. 336-349-2343         

## 2015-08-02 NOTE — Progress Notes (Signed)
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 female is here for follow-up of her ER+, PR+ HER 2 neu + breast cancer. She is doing extremely well with therapy. She is on q 3 week herceptin.  Kristy Harris returns to the [facility] today accompanied by her daughter.   She says she's getting physical therapy; going good but it "whoops me up afterwards." She says "she comes three days a week." they are working on her legs and walking, and she says she's noticed an improvement. Kristy Harris says "I think it's helping some!" She notes "I ain't no spring chicken and of course am going to be stiff some." She was reminded that the goal of the therapy is to recover some range of motion.  She denies any problems on her pill and confirms taking it every day.  She also confirms that her breathing and sleeping are okay, and that her appetite is "wonderful."  She is not due for another MUGA until July. She is due for another mammogram in August.  She is healing really well. She notes that she feels drowsy, weak some days, but other than that "no need to complain, thank God."     Breast cancer of upper-outer quadrant of left female breast ([place])   11/01/2014 Mammogram Possible mass in the  left breast upper outer quadrant measuring 13 mm suspicious for breast cancer confirmed through ultrasound a spiculated hypoechoic mass ill-defined, no enlarged lymph nodes   11/01/2014 Initial Diagnosis Invasive ductal carcinoma, moderately differentiated, ER > 90%, PR> 90%, HER-2 -2+ by IHC, ratio 1.15, KI 67: 29%, T1 cN0 stage IA clinical stage   11/24/2014 Echocardiogram Systolic function was normal. The estimated ejection fraction was in the range of 55% to 60%.    12/25/2014 Surgery Left lumpectomy: IDC 1.7 cm, positive for LVI, with DCIS, 1/1 sentinel node positive deposit 1.9 cm with extracapsular extension, ER 90%, PR 90%, HER-2 positive ratio 2.4, Ki-67 29% T1 cN1 stage II a   02/01/2015 - 04/19/2015 Chemotherapy Abraxane/Herceptin   03/20/2015 Echocardiogram Systolic function wasnormal. The estimated ejection fraction was in the range of 60%to 65%.   05/08/2015 -  Radiation Therapy Dr. Lisbeth Renshaw (planned to be completed on 4/21)   05/10/2015 -  Antibody Plan Herceptin every 21 days   06/13/2015 Echocardiogram 2D echo- The estimated ejection fraction was in the range of 60% to 65%. Diastolic function is abnormal, indeterminate grade. Wallmotion was normal.   06/28/2015 Imaging Bone density- BMD as determined from Femur Total Left is 0.994 g/cm2 with a T-Score of -0.1. This patient is considered normal according to [facility]) criteria.      MEDICAL HISTORY:  Past Medical History  Diagnosis Date  . Diabetes mellitus   . Morbid obesity ([place])   . Recurrent boils     of perineal and buttocks  . Hypertension   .  Hyperlipidemia   . COPD (chronic obstructive pulmonary disease) ([place])   . Enlarged heart   . Anemia   . Arthritis   . Heart murmur   . Urinary frequency   . Sleep apnea     no cpap used since weight loss  . H. pylori infection   . Diverticulosis april 2016  . Hidradenitis suppurativa   . PONV (postoperative nausea and vomiting)   . Aortic stenosis     mild  AS 11/24/14 echo  . Shortness of breath dyspnea     with exertion  . Pneumonia years ago  . GERD (gastroesophageal reflux disease)   . Breast cancer ([place]) 11/01/14    left breast     SURGICAL HISTORY: Past Surgical History  Procedure Laterality Date  . Pilonidal cyst excision    . Bladder suspension      x 2  . Abcess drainage      on bottom  . Cardiac catheterization  02/21/2011    Normal coronary arteries, normal EF  . Tonsillectomy  yrs ago  . Irrigation and debridement abscess Right 12/23/2012    Procedure: incision  AND DEBRIDEMENT right buttock infection ;  Surgeon: Shann Medal, MD;  Location: [facility];  Service: General;  Laterality: Right;  . Hydradenitis excision Left 12/08/2013    Procedure: EXCISION HIDRADENITIS AXILLA;  Surgeon: Alphonsa Overall, MD;  Location: [facility];  Service: General;  Laterality: Left;  . Excision hydradenitis labia N/A 12/08/2013    Procedure: EXCISION HIDRADENITIS PUBIC AREA;  Surgeon: Alphonsa Overall, MD;  Location: [facility];  Service: General;  Laterality: N/A;  . Breast lumpectomy with radioactive seed and sentinel lymph node biopsy Left 12/25/2014    Procedure: RADIOACTIVE SEED GIUDED LEFT BREAST LUMPECTOMY, LEFT AXILLARY SENTINEL LYMPH NODE BIOPSY;  Surgeon: Alphonsa Overall, MD;  Location: [facility];  Service: General;  Laterality: Left;  . Abdominal hysterectomy  25 yrs ago    partial  . Portacath placement N/A 01/12/2015    Procedure: INSERTION PORT-A-CATH;  Surgeon: Alphonsa Overall, MD;  Location: [facility];  Service: General;  Laterality: N/A;    SOCIAL HISTORY: Social History   Social History  . Marital Status: Widowed    Spouse Name: N/A  . Number of Children: 5  . Years of Education: N/A   Occupational History  . Textile work     Disabled   Social History Main Topics  . Smoking status: Former Smoker -- 1.00 packs/day for 10 years    Types: Cigarettes    Quit date: 03/03/1994  . Smokeless tobacco: Never Used  . Alcohol Use: No  . Drug Use: No    . Sexual Activity: Not on file   Other Topics Concern  . Not on file   Social History Narrative   Lives with grandson.  Widowed.  Has 2 sons and 3 daughters  Widowed 77 years 16 children, oldest is 55 yo 8 grandchildren 1 great grandchild Worked in a factory She used to enjoy fishing and play softball. Ex smoker, quit 20 years ago. ETOH, none  FAMILY HISTORY: Family History  Problem Relation Age of Onset  . Heart attack Mother     had multiple health problems  . Lung cancer Father 36    smoker  . Prostate cancer Brother   . Lung cancer Brother     dx. 67s; smoker  . Throat cancer Brother     dx. 36s; smoker  . Diabetes Sister     has 3 living  sisters with multiple health problems  . Hypertension Brother   . Hypertension Son   . Hypertension Daughter   . Breast cancer Sister 30  . Breast cancer Sister     dx. 18s  . Breast cancer Maternal Grandmother     dx. 4s  . Breast cancer Maternal Aunt     dx. older than 32  . Dementia Maternal Aunt   . Breast cancer Cousin     dx. 45s   indicated that her mother is deceased. She indicated that her father is deceased. She indicated that two of her four sisters are alive. She indicated that all of her three brothers are deceased. She indicated that her maternal grandmother is deceased. She indicated that her maternal grandfather is deceased. She indicated that her paternal grandmother is deceased. She indicated that her paternal grandfather is deceased. She indicated that her daughter is alive. She indicated that only one of her two sons is alive. She indicated that both of her maternal aunts are deceased. She indicated that her maternal uncle is deceased. She indicated that her paternal aunt is deceased. She indicated that her cousin is alive.  Mother died at 92 of a heart attack, she had a large heart and a hole in her heart. Non-smoker Father died at 56 of lung cancer, he was a smoker. 9 siblings, 4 living. One sister with  breast cancer bilaterally, she is still living Another sister with breast cancer  ALLERGIES:  is allergic to lisinopril and penicillins.  MEDICATIONS:  Current Outpatient Prescriptions  Medication Sig Dispense Refill  . acetaminophen (TYLENOL) 500 MG tablet Take 1,000 mg by mouth every 6 (six) hours as needed for mild pain or headache.    . albuterol (PROVENTIL HFA;VENTOLIN HFA) 108 (90 BASE) MCG/ACT inhaler Inhale 2 puffs into the lungs every 4 (four) hours as needed for shortness of breath.    . anastrozole (ARIMIDEX) 1 MG tablet Take 1 tablet (1 mg total) by mouth daily. 30 tablet 2  . atorvastatin (LIPITOR) 20 MG tablet Take 20 mg by mouth every morning.     . bismuth subsalicylate (PEPTO BISMOL) 262 MG chewable tablet Chew 2 tablets (524 mg total) by mouth 2 (two) times daily. (Patient taking differently: Chew 524 mg by mouth 2 (two) times daily as needed for indigestion (acid reflux). ) 22 tablet 0  . Cholecalciferol (VITAMIN D-3) 5000 UNITS TABS Take 5,000 Units by mouth every morning. Reported on 07/12/2015    . ergocalciferol (VITAMIN D2) 50000 units capsule Take 1 capsule (50,000 Units total) by mouth once a week. 12 capsule 0  . ferrous sulfate 325 (65 FE) MG tablet Take 1 tablet (325 mg total) by mouth every morning. (Patient taking differently: Take 325 mg by mouth 3 (three) times daily with meals. ) 30 tablet 0  . furosemide (LASIX) 20 MG tablet TAKE ONE TABLET BY MOUTH ONCE DAILY.    Marland Kitchen glipiZIDE (GLUCOTROL XL) 10 MG 24 hr tablet Take 10 mg by mouth daily with breakfast.     . HYDROcodone-acetaminophen (NORCO/VICODIN) 5-325 MG per tablet Take 1 tablet by mouth 2 (two) times daily as needed for moderate pain.     Marland Kitchen ketoconazole (NIZORAL) 2 % cream Reported on 07/12/2015  0  . KLOR-CON M20 20 MEQ tablet TAKE 1 TABLET (20 MEQ TOTAL) BY MOUTH DAILY. 30 tablet 2  . lidocaine-prilocaine (EMLA) cream APPLY A QUARTER SIXE AMOUNT TO PORT SITE 1 HOUR PRIO TO CHEMO. DO NOT RUB IN. COVER W/  PLASTIC WRAP 30 g 3  . losartan (COZAAR) 100 MG tablet Take 100 mg by mouth daily.    . metFORMIN (GLUCOPHAGE-XR) 500 MG 24 hr tablet Take 500 mg by mouth 2 (two) times daily.     . metoprolol (TOPROL-XL) 100 MG 24 hr tablet Take 100 mg by mouth every morning.     . ondansetron (ZOFRAN) 8 MG tablet Take 1 tablet (8 mg total) by mouth every 8 (eight) hours as needed for nausea or vomiting. 30 tablet 3  . pantoprazole (PROTONIX) 40 MG tablet TAKE 1 TABLET (40 MG TOTAL) BY MOUTH DAILY. 90 tablet 0  . polyethylene glycol (MIRALAX / GLYCOLAX) packet Take 17 g by mouth daily as needed for mild constipation. 14 each 0  . prochlorperazine (COMPAZINE) 10 MG tablet Take 1 tablet (10 mg total) by mouth every 6 (six) hours as needed for nausea or vomiting. 30 tablet 2  . Trastuzumab (HERCEPTIN IV) Inject into the vein. To be given weekly    . verapamil (CALAN-SR) 120 MG CR tablet Take 120 mg by mouth daily.     No current facility-administered medications for this visit.   Facility-Administered Medications Ordered in Other Visits  Medication Dose Route Frequency Provider Last Rate Last Dose  . heparin lock flush 100 unit/mL  500 Units Intracatheter Once PRN Patrici Ranks, MD      . sodium chloride flush (NS) 0.9 % injection 10 mL  10 mL Intracatheter PRN Patrici Ranks, MD      . trastuzumab (HERCEPTIN) 756 mg in sodium chloride 0.9 % 250 mL chemo infusion  6 mg/kg (Treatment Plan Actual) Intravenous Once Patrici Ranks, MD 572 mL/hr at 08/02/15 0958 756 mg at 08/02/15 7673    Review of Systems  Constitutional: Positive for malaise/fatigue. Negative for fever, chills and weight loss.  HENT: Negative.  Negative for congestion, hearing loss, nosebleeds, sore throat and tinnitus.   Eyes: Negative.  Negative for blurred vision, double vision, pain and discharge.  Respiratory: Negative.  Negative for cough, hemoptysis, sputum production, shortness of breath and wheezing.   Cardiovascular: Negative.   Negative for chest pain, palpitations, claudication, leg swelling and PND.  Gastrointestinal: Negative for heartburn, nausea, vomiting, abdominal pain, diarrhea, constipation, blood in stool and melena.  Genitourinary: Negative for dysuria, urgency, frequency and hematuria.  Musculoskeletal: Positive for joint pain. Negative for myalgias and falls.       Muscles in legs feel weaker. Has to use walker to walk. In crease of leg hurts. Legs are sore.  Skin: Negative.  Negative for itching and rash.  Neurological: Positive for tingling. Negative for dizziness, tremors, sensory change, speech change, focal weakness, seizures, loss of consciousness, weakness and headaches.       Neuropathy in fingers and toes. Feels numbness  Endo/Heme/Allergies: Negative.  Does not bruise/bleed easily.  Psychiatric/Behavioral: Negative.  Negative for depression, suicidal ideas, memory loss and substance abuse. The patient is not nervous/anxious and does not have insomnia.   All other systems reviewed and are negative.  14 point ROS was done and is otherwise as detailed above or in HPI  PHYSICAL EXAMINATION: ECOG PERFORMANCE STATUS: 2 - Symptomatic, <50% confined to bed   Filed Vitals:   08/02/15 0933  BP: 140/50  Pulse: 82  Temp: 97.9 F (36.6 C)  Resp: 20   Filed Weights   08/02/15 0933  Weight: 267 lb 4.8 oz (121.246 kg)    Physical Exam  Constitutional: She is oriented to person, place,  and time and well-developed, well-nourished, and in no distress.  Hair regrowth noted  HENT:  Head: Normocephalic and atraumatic.  Nose: Nose normal.  Mouth/Throat: Oropharynx is clear and moist. No oropharyngeal exudate.  Eyes: Conjunctivae and EOM are normal. Pupils are equal, round, and reactive to light. Right eye exhibits no discharge. Left eye exhibits no discharge. No scleral icterus.  Neck: Normal range of motion. Neck supple. No tracheal deviation present. No thyromegaly present.  Cardiovascular: Normal  rate and regular rhythm.  Exam reveals no gallop and no friction rub.   No murmur heard. Pulmonary/Chest: Effort normal and breath sounds normal. She has no wheezes. She has no rales.  Abdominal: Soft. Bowel sounds are normal. She exhibits no distension and no mass. There is no tenderness. There is no rebound and no guarding.  Obese  Musculoskeletal: Normal range of motion. She exhibits no edema.  Lymphadenopathy:    She has no cervical adenopathy.  Neurological: She is alert and oriented to person, place, and time. No cranial nerve deficit. Coordination normal.  Skin: Skin is warm and dry. No rash noted.  Psychiatric: Mood, memory, affect and judgment normal.  Nursing note and vitals reviewed.   LABORATORY DATA:  I have reviewed the data as listed Lab Results  Component Value Date   WBC 4.3 07/12/2015   HGB 11.1* 07/12/2015   HCT 35.6* 07/12/2015   MCV 86.6 07/12/2015   PLT 218 07/12/2015   CMP     Component Value Date/Time   NA 137 07/12/2015 0940   K 3.6 07/12/2015 0940   CL 102 07/12/2015 0940   CO2 28 07/12/2015 0940   GLUCOSE 156* 07/12/2015 0940   BUN 14 07/12/2015 0940   CREATININE 0.69 07/12/2015 0940   CALCIUM 9.0 07/12/2015 0940   PROT 7.6 07/12/2015 0940   ALBUMIN 3.4* 07/12/2015 0940   AST 23 07/12/2015 0940   ALT 22 07/12/2015 0940   ALKPHOS 105 07/12/2015 0940   BILITOT 0.4 07/12/2015 0940   GFRNONAA >60 07/12/2015 0940   GFRAA >60 07/12/2015 0940     RADIOLOGY: I have personally reviewed the radiological images as listed and agreed with the findings in the report.   Study Result     EXAM: DUAL X-RAY ABSORPTIOMETRY (DXA) FOR BONE MINERAL DENSITY  IMPRESSION: Ordering Physician: Dr. Baird Cancer,  Your patient Kristy Harris completed a BMD test on 06/28/2015 using the [employer] [place] (software version: 14.10) manufactured by [employer]. The following summarizes the results of our evaluation. PATIENT  BIOGRAPHICAL: Name: Kristy Harris, Kristy Harris Patient ID: [id number] Birth Date: 1945-03-06 Height: 63.5 in. Gender: Female Exam Date: 06/28/2015 Weight: 269.0 lbs. Indications: Breast Ca, Height Loss, Low Calcium Intake, Partial Hysterectomy, Post Menopausal Fractures: Treatments: Vitamin D DENSITOMETRY RESULTS: Site Region Measured Date Measured Age WHO Classification Young Adult T-score BMD %Change vs. Previous Significant Change (*) DualFemur Total Left 06/28/2015 69.6 Normal -0.1 0.994 g/cm2 - -  Left Forearm Radius 33% 06/28/2015 67.6 Normal 1.6 0.830 g/cm2 - - ASSESSMENT: BMD as determined from Femur Total Left is 0.994 g/cm2 with a T-Score of -0.1. This patient is considered normal according to [facility]) criteria. (Lumbar spine was not utilized due to advanced degenerative changes.)  World Pharmacologist (WHO) criteria for post-menopausal, Caucasian Women: Normal: T-score at or above -1 SD Osteopenia: T-score between -1 and -2.5 SD Osteoporosis: T-score at or below -2.5 SD  RECOMMENDATIONS: [facility] recommends that FDA-approved medial therapies be considered in postmenopausal  women and men age 40 or older with a: 1. Hip or vertebral (clinical or morphometric) fracture. 2. T-Score of < -2.5 at the spine or hip. 3. Ten-year fracture probability by FRAX of 3% or greater for hip fracture or 20% or greater for major osteoporotic fracture.  All treatment decisions require clinical judgment and consideration of individual patient factors, including patient preferences, co-morbidities, previous drug use, risk factors not captured in the FRAX model (e.g. falls, vitamin D deficiency, increased bone turnover, interval significant decline in bone density) and possible under-or over-estimation of fracture risk by FRAX.  All patients should ensure an adequate intake of dietary calcium (1200 mg/d) and  vitamin D (800 IU daily) unless contraindicated.  FOLLOW-UP: People with diagnosed cases of osteoporosis or osteopenia should be regularly tested for bone mineral density. For patients eligible for Medicare, routine testing is allowed once every 2 years. Testing frequency can be increased for patients who have rapidly progressing disease, or for those who are receiving medical therapy to restore bone mass.  I have reviewed this report, and agree with the above findings.  [facility]   Electronically Signed  By: David Martinique M.D.  On: 06/28/2015 11:28   Study Result    Result status: Final result     *[address], [place] [address]  [phone]  ------------------------------------------------------------------- Transthoracic Echocardiography  Patient: Kristy Harris, Kristy Harris MR #: [id number] Study Date: 06/13/2015 Gender: F Age: 23 Height: 161.3 cm Weight: 124.2 kg BSA: 2.43 m^2 Pt. Status: Room:  ATTENDING Virgina Norfolk HALPFXT, KWIOXB D REFERRING Baird Cancer PERFORMING [facility], Forestine Na SONOGRAPHER Alvino Chapel, RCS  cc:  ------------------------------------------------------------------- LV EF: 60% - 65%  ------------------------------------------------------------------- Indications: Chemo V67.2.  ------------------------------------------------------------------- History: PMH: Left breast cancer with Chemotherapy, Obesity. Risk factors: Hypertension. Diabetes mellitus. Dyslipidemia.  ------------------------------------------------------------------- Study Conclusions  - Left ventricle: The cavity size was normal. Wall thickness was  increased in a pattern of moderate LVH. Systolic function was  normal. The  estimated ejection fraction was in the range of 60%  to 65%. Diastolic function is abnormal, indeterminate grade. Wall  motion was normal; there were no regional wall motion  abnormalities. - Aortic valve: Moderately calcified annulus. Trileaflet. There was  mild to moderate stenosis. Valve area (VTI): 1.13 cm^2. - Mitral valve: Mildly calcified annulus. Mildly thickened leaflets  . There was mild regurgitation. - Pulmonary arteries: Systolic pressure was mildly increased. PA  peak pressure: 34 mm Hg (S). - Technically adequate study.  Transthoracic echocardiography. M-mode, complete 2D, spectral Doppler, and color Doppler. Birthdate: Patient birthdate: 10-23-1945. Age: Patient is 70 yr old. Sex: Gender: female. BMI: 47.8 kg/m^2. Blood pressure: 131/52 Patient status: Inpatient. Study date: Study date: 06/13/2015. Study time: 11:45 AM. Location: Echo laboratory.  -------------------------------------------------------------------  ------------------------------------------------------------------- Left ventricle: The cavity size was normal. Wall thickness was increased in a pattern of moderate LVH. Systolic function was normal. The estimated ejection fraction was in the range of 60% to 65%. Diastolic function is abnormal, indeterminate grade. Wall motion was normal; there were no regional wall motion abnormalities.  ------------------------------------------------------------------- Aortic valve: Moderately calcified annulus. Trileaflet. Doppler:  There was mild to moderate stenosis. There was no significant regurgitation. VTI ratio of LVOT to aortic valve: 0.4. Valve area (VTI): 1.13 cm^2. Indexed valve area (VTI): 0.46 cm^2/m^2. Peak velocity ratio of LVOT to aortic valve: 0.35. Mean gradient (S): 18 mm Hg. Peak gradient (S): 35 mm Hg.  ------------------------------------------------------------------- Aorta: Aortic root: The  aortic  root was normal in size.  ------------------------------------------------------------------- Mitral valve: Mildly calcified annulus. Mildly thickened leaflets . Doppler: There was no evidence for stenosis. There was mild regurgitation. Mean gradient (D): 4 mm Hg. Peak gradient (D): 7 mm Hg.  ------------------------------------------------------------------- Left atrium: The atrium was normal in size.  ------------------------------------------------------------------- Atrial septum: No defect or patent foramen ovale was identified.  ------------------------------------------------------------------- Right ventricle: Poorly visualized. The cavity size was normal. Wall thickness was normal. Systolic function was normal.  ------------------------------------------------------------------- Pulmonic valve: Not well visualized. Doppler: There was no evidence for stenosis. There was mild regurgitation.  ------------------------------------------------------------------- Tricuspid valve: Normal thickness leaflets. Doppler: There was no evidence for stenosis. There was mild regurgitation.  ------------------------------------------------------------------- Pulmonary artery: Systolic pressure was mildly increased.  ------------------------------------------------------------------- Right atrium: Poorly visualized. The atrium was normal in size.  ------------------------------------------------------------------- Pericardium: There was no pericardial effusion.  ------------------------------------------------------------------- Systemic veins: Inferior vena cava: The vessel was normal in size. The respirophasic diameter changes were in the normal range (>= 50%), consistent with normal central venous pressure.  ------------------------------------------------------------------- Measurements  Left ventricle  Value Reference LV ID, ED, PLAX chordal 50.2 mm 43 - 52 LV ID, ES, PLAX chordal 32.9 mm 23 - 38 LV fx shortening, PLAX chordal 34 % >=29 LV PW thickness, ED 12 mm --------- IVS/LV PW ratio, ED 1.08 <=1.3  Ventricular septum Value Reference IVS thickness, ED 12.9 mm ---------  LVOT Value Reference LVOT ID, S 19 mm --------- LVOT area 2.84 cm^2 --------- LVOT peak velocity, S 103.92 cm/s --------- LVOT mean velocity, S 76.7 cm/s --------- LVOT VTI, S 26.05 cm ---------  Aortic valve Value Reference Aortic valve peak velocity, S 296 cm/s --------- Aortic valve mean velocity, S 193 cm/s --------- Aortic valve VTI, S 65.5 cm --------- Aortic mean gradient, S 18 mm Hg --------- Aortic peak gradient, S 35 mm Hg --------- VTI ratio, LVOT/AV 0.4 --------- Aortic valve area, VTI 1.13 cm^2 --------- Aortic valve area/bsa, VTI 0.46 cm^2/m^2 --------- Velocity ratio, peak, LVOT/AV 0.35 ---------  Aorta Value Reference Aortic root ID, ED 33 mm ---------  Left atrium Value Reference LA ID, A-P, ES 48 mm --------- LA volume/bsa, S 26.8 ml/m^2 ---------  Mitral  valve Value Reference Mitral E-wave peak velocity 135 cm/s --------- Mitral A-wave peak velocity 123 cm/s --------- Mitral mean velocity, D 94.48 cm/s --------- Mitral deceleration time (H) 283 ms 150 - 230 Mitral mean gradient, D 4 mm Hg --------- Mitral peak gradient, D 7 mm Hg --------- Mitral E/A ratio, peak 1.1 ---------  Pulmonary arteries Value Reference PA pressure, S, DP (H) 34 mm Hg <=30  Right ventricle Value Reference RV ID, ED, PLAX 26.5 mm 19 - 38 TAPSE 27 mm --------- RV s&', lateral, S 15.3 cm/s ---------  Legend: (L) and (H) mark values outside specified reference range.  ------------------------------------------------------------------- Prepared and Electronically Authenticated by  Kerry Hough, M.D. 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 She has completed XRT. She is here today for ongoing Q3week herceptin.She has normal bone density. She was counseled to take calcium 1200 mg daily and vitamin D at least 1000 to 2000 IU daily. She is on arimidex and doing well. She is to continue  this daily.   She is undergoing PT and is benefiting. She is due for repeat ECHO in July.  She will return in  3 weeks for a follow up and ongoing herceptin.  All questions were answered. The patient knows to call the clinic with any problems, questions or concerns.  This document serves as a record of services personally performed by Ancil Linsey, MD. It was created on her behalf by Toni Amend, a trained medical scribe. The creation of this record is based on the scribe's personal observations and the provider's statements to them. This document has been checked and approved by the attending provider.  I have reviewed the above documentation for accuracy and completeness, and I agree with the above.  This note was electronically signed.  Molli Hazard, MD  08/02/2015 10:05 AM

## 2015-08-02 NOTE — Patient Instructions (Addendum)
[place] at [facility] Discharge Instructions  RECOMMENDATIONS MADE BY THE CONSULTANT AND ANY TEST RESULTS WILL BE SENT TO YOUR REFERRING PHYSICIAN.   Return in 3 weeks for Herceptin   Return 6 weeks with Dr. Whitney Muse  ECHO 7/12 orders in  Thank you for choosing [place] at [facility] to provide your oncology and hematology care.  To afford each patient quality time with our provider, please arrive at least 15 minutes before your scheduled appointment time.   Beginning January 23rd 2017 lab work for the [facility] will be done in the  Main lab at [facility] on 1st floor. If you have a lab appointment with the [facility] please come in thru the  Main Entrance and check in at the main information desk  You need to re-schedule your appointment should you arrive 10 or more minutes late.  We strive to give you quality time with our providers, and arriving late affects you and other patients whose appointments are after yours.  Also, if you no show three or more times for appointments you may be dismissed from the clinic at the providers discretion.     Again, thank you for choosing [facility].  Our hope is that these requests will decrease the amount of time that you wait before being seen by our physicians.       _____________________________________________________________  Should you have questions after your visit to [facility], please contact our office at [phone] between the hours of 8:30 a.m. and 4:30 p.m.  Voicemails left after 4:30 p.m. will not be returned until the following business day.  For prescription refill requests, have your pharmacy contact our office.         Resources For Cancer Patients and their Caregivers ? American Cancer Society: Can assist with transportation, wigs, general needs, runs Look Good Feel Better.        251-017-9779 ? Cancer Care: Provides financial  assistance, online support groups, medication/co-pay assistance.  1-800-813-HOPE (714) 576-6673) ? [place] Assists [place] cancer patients and their families through emotional , educational and financial support.  351-781-4173 ? [place] DSS Where to apply for food stamps, Medicaid and utility assistance. (574)284-4056 ? RCATS: Transportation to medical appointments. (507)200-5416 ? Social Security Administration: May apply for disability if have a Stage IV cancer. 239-735-7112 737 214 6572 ? [employer], Disability and Transit Services: Assists with nutrition, care and transit needs. [place] Support Programs: @10RELATIVEDAYS @ > Cancer Support Group  2nd Tuesday of the month 1pm-2pm, Journey Room  > Creative Journey  3rd Tuesday of the month 1130am-1pm, Journey Room  > Look Good Feel Better  1st Wednesday of the month 10am-12 noon, Journey Room (Call [facility] to register [phone])

## 2015-08-02 NOTE — Progress Notes (Signed)
Tolerated infusion w/o adverse reaction.  Discharged via wheelchair in c/o family for transport home.

## 2015-08-03 ENCOUNTER — Encounter (HOSPITAL_COMMUNITY): Payer: Self-pay | Admitting: Hematology & Oncology

## 2015-08-23 ENCOUNTER — Encounter (HOSPITAL_COMMUNITY): Payer: Self-pay

## 2015-08-23 ENCOUNTER — Encounter (HOSPITAL_BASED_OUTPATIENT_CLINIC_OR_DEPARTMENT_OTHER): Payer: Medicare Other

## 2015-08-23 ENCOUNTER — Ambulatory Visit (HOSPITAL_COMMUNITY): Payer: Medicare Other | Admitting: Oncology

## 2015-08-23 VITALS — BP 165/85 | HR 72 | Temp 98.2°F | Resp 20 | Wt 265.0 lb

## 2015-08-23 DIAGNOSIS — Z5112 Encounter for antineoplastic immunotherapy: Secondary | ICD-10-CM | POA: Diagnosis not present

## 2015-08-23 DIAGNOSIS — D509 Iron deficiency anemia, unspecified: Secondary | ICD-10-CM | POA: Diagnosis present

## 2015-08-23 DIAGNOSIS — D649 Anemia, unspecified: Secondary | ICD-10-CM | POA: Diagnosis present

## 2015-08-23 DIAGNOSIS — C50412 Malignant neoplasm of upper-outer quadrant of left female breast: Secondary | ICD-10-CM | POA: Diagnosis not present

## 2015-08-23 DIAGNOSIS — L738 Other specified follicular disorders: Secondary | ICD-10-CM | POA: Diagnosis present

## 2015-08-23 LAB — CBC WITH DIFFERENTIAL/PLATELET
BASOS ABS: 0 10*3/uL (ref 0.0–0.1)
Basophils Relative: 0 %
EOS PCT: 4 %
Eosinophils Absolute: 0.2 10*3/uL (ref 0.0–0.7)
HCT: 37.3 % (ref 36.0–46.0)
HEMOGLOBIN: 11.7 g/dL — AB (ref 12.0–15.0)
LYMPHS ABS: 1.7 10*3/uL (ref 0.7–4.0)
LYMPHS PCT: 31 %
MCH: 26.1 pg (ref 26.0–34.0)
MCHC: 31.4 g/dL (ref 30.0–36.0)
MCV: 83.3 fL (ref 78.0–100.0)
Monocytes Absolute: 0.3 10*3/uL (ref 0.1–1.0)
Monocytes Relative: 5 %
NEUTROS PCT: 60 %
Neutro Abs: 3.2 10*3/uL (ref 1.7–7.7)
PLATELETS: 207 10*3/uL (ref 150–400)
RBC: 4.48 MIL/uL (ref 3.87–5.11)
RDW: 15.2 % (ref 11.5–15.5)
WBC: 5.4 10*3/uL (ref 4.0–10.5)

## 2015-08-23 LAB — COMPREHENSIVE METABOLIC PANEL
ALK PHOS: 114 U/L (ref 38–126)
ALT: 34 U/L (ref 14–54)
AST: 25 U/L (ref 15–41)
Albumin: 3.8 g/dL (ref 3.5–5.0)
Anion gap: 7 (ref 5–15)
BUN: 16 mg/dL (ref 6–20)
CALCIUM: 9.1 mg/dL (ref 8.9–10.3)
CO2: 29 mmol/L (ref 22–32)
CREATININE: 0.72 mg/dL (ref 0.44–1.00)
Chloride: 102 mmol/L (ref 101–111)
GFR calc non Af Amer: >60 mL/min (ref >60)
Glucose, Bld: 126 mg/dL — ABNORMAL HIGH (ref 65–99)
Potassium: 3.7 mmol/L (ref 3.5–5.1)
SODIUM: 138 mmol/L (ref 135–145)
Total Bilirubin: 0.9 mg/dL (ref 0.3–1.2)
Total Protein: 8 g/dL (ref 6.5–8.1)

## 2015-08-23 MED ORDER — SODIUM CHLORIDE 0.9% FLUSH: 10.0000 mL | INTRAVENOUS | Status: DC | PRN

## 2015-08-23 MED ORDER — DIPHENHYDRAMINE HCL 25 MG PO CAPS
50.0000 mg | ORAL_CAPSULE | Freq: Once | ORAL | Status: AC
  Administered 2015-08-23: 50 mg via ORAL
  Filled 2015-08-23: qty 2

## 2015-08-23 MED ORDER — TRASTUZUMAB CHEMO INJECTION 440 MG
6.0000 mg/kg | Freq: Once | INTRAVENOUS | Status: AC
  Administered 2015-08-23: 756 mg via INTRAVENOUS
  Filled 2015-08-23: qty 36

## 2015-08-23 MED ORDER — ACETAMINOPHEN 325 MG PO TABS
ORAL_TABLET | ORAL | Status: AC
  Filled 2015-08-23: qty 2

## 2015-08-23 MED ORDER — SODIUM CHLORIDE 0.9 % IV SOLN
Freq: Once | INTRAVENOUS | Status: AC
  Administered 2015-08-23: 10:00:00 via INTRAVENOUS

## 2015-08-23 MED ORDER — ACETAMINOPHEN 325 MG PO TABS
650.0000 mg | ORAL_TABLET | Freq: Once | ORAL | Status: AC
  Administered 2015-08-23: 650 mg via ORAL
  Filled 2015-08-23: qty 2

## 2015-08-23 MED ORDER — HEPARIN SOD (PORK) LOCK FLUSH 100 UNIT/ML IV SOLN
500.0000 [IU] | Freq: Once | INTRAVENOUS | Status: AC | PRN
  Administered 2015-08-23: 500 [IU]
  Filled 2015-08-23: qty 5

## 2015-08-23 NOTE — Progress Notes (Signed)
1215:  Tolerated infusion w/o adverse reaction.  A&Ox4, VSS.  In no distress.  Discharged via wheelchair in c/o daughter.

## 2015-08-23 NOTE — Patient Instructions (Signed)
[facility] Discharge Instructions for Patients Receiving Chemotherapy   Beginning January 23rd 2017 lab work for the Cancer Center will be done in the  Main lab at [facility] on 1st floor. If you have a lab appointment with the Cancer Center please come in thru the  Main Entrance and check in at the main information desk   Today you received the following chemotherapy agents Herceptin. If you develop nausea and vomiting, or diarrhea that is not controlled by your medication, call the clinic.  The clinic phone number is [phone]. Office hours are Monday-Friday 8:30am-5:00pm.  BELOW ARE SYMPTOMS THAT SHOULD BE REPORTED IMMEDIATELY:  *FEVER GREATER THAN 101.0 F  *CHILLS WITH OR WITHOUT FEVER  NAUSEA AND VOMITING THAT IS NOT CONTROLLED WITH YOUR NAUSEA MEDICATION  *UNUSUAL SHORTNESS OF BREATH  *UNUSUAL BRUISING OR BLEEDING  TENDERNESS IN MOUTH AND THROAT WITH OR WITHOUT PRESENCE OF ULCERS  *URINARY PROBLEMS  *BOWEL PROBLEMS  UNUSUAL RASH Items with * indicate a potential emergency and should be followed up as soon as possible. If you have an emergency after office hours please contact your primary care physician or go to the nearest emergency department.  Please call the clinic during office hours if you have any questions or concerns.   You may also contact the Patient Navigator at [phone] should you have any questions or need assistance in obtaining follow up care.      Resources For Cancer Patients and their Caregivers ? American Cancer Society: Can assist with transportation, wigs, general needs, runs Look Good Feel Better.        1-888-227-6333 ? Cancer Care: Provides financial assistance, online support groups, medication/co-pay assistance.  1-800-813-HOPE (4673) ? [facility] Assists [place] cancer patients and their families through emotional , educational and financial support.   336-427-4357 ? [place] DSS Where to apply for food stamps, Medicaid and utility assistance. 336-342-1394 ? RCATS: Transportation to medical appointments. 336-347-2287 ? Social Security Administration: May apply for disability if have a Stage IV cancer. 336-342-7796 1-800-772-1213 ? [place] Aging, Disability and Transit Services: Assists with nutrition, care and transit needs. 336-349-2343         

## 2015-09-06 ENCOUNTER — Other Ambulatory Visit (HOSPITAL_COMMUNITY): Payer: Self-pay | Admitting: Hematology & Oncology

## 2015-09-12 ENCOUNTER — Ambulatory Visit (HOSPITAL_COMMUNITY)
Admission: RE | Admit: 2015-09-12 | Discharge: 2015-09-12 | Disposition: A | Discharge status: Home / Self Care | Payer: Medicare Other | Source: Ambulatory Visit | Attending: Hematology & Oncology | Admitting: Hematology & Oncology

## 2015-09-12 DIAGNOSIS — Z79899 Other long term (current) drug therapy: Secondary | ICD-10-CM | POA: Insufficient documentation

## 2015-09-12 DIAGNOSIS — E669 Obesity, unspecified: Secondary | ICD-10-CM | POA: Insufficient documentation

## 2015-09-12 DIAGNOSIS — I071 Rheumatic tricuspid insufficiency: Secondary | ICD-10-CM | POA: Insufficient documentation

## 2015-09-12 DIAGNOSIS — E785 Hyperlipidemia, unspecified: Secondary | ICD-10-CM | POA: Insufficient documentation

## 2015-09-12 DIAGNOSIS — I350 Nonrheumatic aortic (valve) stenosis: Secondary | ICD-10-CM | POA: Insufficient documentation

## 2015-09-12 DIAGNOSIS — Z09 Encounter for follow-up examination after completed treatment for conditions other than malignant neoplasm: Secondary | ICD-10-CM | POA: Insufficient documentation

## 2015-09-12 DIAGNOSIS — I340 Nonrheumatic mitral (valve) insufficiency: Secondary | ICD-10-CM | POA: Diagnosis not present

## 2015-09-12 DIAGNOSIS — C50412 Malignant neoplasm of upper-outer quadrant of left female breast: Secondary | ICD-10-CM | POA: Diagnosis not present

## 2015-09-12 DIAGNOSIS — I119 Hypertensive heart disease without heart failure: Secondary | ICD-10-CM | POA: Diagnosis not present

## 2015-09-12 DIAGNOSIS — Z6841 Body Mass Index (BMI) 40.0 and over, adult: Secondary | ICD-10-CM | POA: Diagnosis not present

## 2015-09-12 DIAGNOSIS — E119 Type 2 diabetes mellitus without complications: Secondary | ICD-10-CM | POA: Diagnosis not present

## 2015-09-12 NOTE — Progress Notes (Signed)
*  PRELIMINARY RESULTS* Echocardiogram 2D Echocardiogram has been performed.  Kristy Harris 09/12/2015, 12:17 PM

## 2015-09-13 ENCOUNTER — Encounter (HOSPITAL_COMMUNITY): Payer: Medicare Other | Attending: Hematology & Oncology | Admitting: Hematology & Oncology

## 2015-09-13 ENCOUNTER — Encounter (HOSPITAL_COMMUNITY): Payer: Self-pay | Admitting: Hematology & Oncology

## 2015-09-13 ENCOUNTER — Encounter (HOSPITAL_BASED_OUTPATIENT_CLINIC_OR_DEPARTMENT_OTHER): Payer: Medicare Other

## 2015-09-13 ENCOUNTER — Inpatient Hospital Stay (HOSPITAL_COMMUNITY): Payer: Medicare Other

## 2015-09-13 VITALS — BP 150/55 | HR 70 | Temp 97.9°F | Resp 18

## 2015-09-13 VITALS — BP 144/66 | HR 75 | Temp 98.1°F | Resp 18 | Wt 265.8 lb

## 2015-09-13 DIAGNOSIS — Z5112 Encounter for antineoplastic immunotherapy: Secondary | ICD-10-CM | POA: Diagnosis not present

## 2015-09-13 DIAGNOSIS — D509 Iron deficiency anemia, unspecified: Secondary | ICD-10-CM | POA: Insufficient documentation

## 2015-09-13 DIAGNOSIS — Z79899 Other long term (current) drug therapy: Secondary | ICD-10-CM

## 2015-09-13 DIAGNOSIS — C50412 Malignant neoplasm of upper-outer quadrant of left female breast: Secondary | ICD-10-CM | POA: Insufficient documentation

## 2015-09-13 DIAGNOSIS — C773 Secondary and unspecified malignant neoplasm of axilla and upper limb lymph nodes: Secondary | ICD-10-CM

## 2015-09-13 DIAGNOSIS — E119 Type 2 diabetes mellitus without complications: Secondary | ICD-10-CM

## 2015-09-13 DIAGNOSIS — D649 Anemia, unspecified: Secondary | ICD-10-CM | POA: Insufficient documentation

## 2015-09-13 DIAGNOSIS — E669 Obesity, unspecified: Secondary | ICD-10-CM | POA: Diagnosis not present

## 2015-09-13 DIAGNOSIS — Z1239 Encounter for other screening for malignant neoplasm of breast: Secondary | ICD-10-CM

## 2015-09-13 DIAGNOSIS — D6481 Anemia due to antineoplastic chemotherapy: Secondary | ICD-10-CM | POA: Diagnosis not present

## 2015-09-13 DIAGNOSIS — L738 Other specified follicular disorders: Secondary | ICD-10-CM | POA: Insufficient documentation

## 2015-09-13 MED ORDER — ACETAMINOPHEN 325 MG PO TABS
ORAL_TABLET | ORAL | Status: AC
  Filled 2015-09-13: qty 2

## 2015-09-13 MED ORDER — LIDOCAINE-PRILOCAINE 2.5-2.5 % EX CREA: TOPICAL_CREAM | CUTANEOUS | Status: DC | PRN

## 2015-09-13 MED ORDER — SODIUM CHLORIDE 0.9 % IV SOLN
Freq: Once | INTRAVENOUS | Status: AC
  Administered 2015-09-13: 12:00:00 via INTRAVENOUS

## 2015-09-13 MED ORDER — ACETAMINOPHEN 325 MG PO TABS
650.0000 mg | ORAL_TABLET | Freq: Once | ORAL | Status: AC
  Administered 2015-09-13: 650 mg via ORAL

## 2015-09-13 MED ORDER — SODIUM CHLORIDE 0.9% FLUSH
10.0000 mL | INTRAVENOUS | Status: DC | PRN
  Administered 2015-09-13: 10 mL
  Filled 2015-09-13: qty 10

## 2015-09-13 MED ORDER — HEPARIN SOD (PORK) LOCK FLUSH 100 UNIT/ML IV SOLN
500.0000 [IU] | Freq: Once | INTRAVENOUS | Status: AC | PRN
  Administered 2015-09-13: 500 [IU]

## 2015-09-13 MED ORDER — HEPARIN SOD (PORK) LOCK FLUSH 100 UNIT/ML IV SOLN
INTRAVENOUS | Status: AC
  Filled 2015-09-13: qty 5

## 2015-09-13 MED ORDER — DIPHENHYDRAMINE HCL 25 MG PO CAPS
50.0000 mg | ORAL_CAPSULE | Freq: Once | ORAL | Status: AC
  Administered 2015-09-13: 50 mg via ORAL

## 2015-09-13 MED ORDER — TRASTUZUMAB CHEMO INJECTION 440 MG
6.0000 mg/kg | Freq: Once | INTRAVENOUS | Status: AC
  Administered 2015-09-13: 756 mg via INTRAVENOUS
  Filled 2015-09-13: qty 20.95

## 2015-09-13 MED ORDER — DIPHENHYDRAMINE HCL 25 MG PO CAPS
ORAL_CAPSULE | ORAL | Status: AC
  Filled 2015-09-13: qty 2

## 2015-09-13 NOTE — Progress Notes (Signed)
Tolerated herceptin infusion well. Stable on discharge home with daughter via wheelchair.

## 2015-09-13 NOTE — Patient Instructions (Signed)
[place] at [facility] Discharge Instructions  RECOMMENDATIONS MADE BY THE CONSULTANT AND ANY TEST RESULTS WILL BE SENT TO YOUR REFERRING PHYSICIAN.  You were seen by Dr. Whitney Muse today. Return to clinic in 3 weeks for chemo, lab work and follow-up appointment. We will call in EMLA cream for you. Call clinic with any questions or concerns.   Thank you for choosing [place] at [facility] to provide your oncology and hematology care.  To afford each patient quality time with our provider, please arrive at least 15 minutes before your scheduled appointment time.   Beginning January 23rd 2017 lab work for the [facility] will be done in the  Main lab at [facility] on 1st floor. If you have a lab appointment with the [facility] please come in thru the  Main Entrance and check in at the main information desk  You need to re-schedule your appointment should you arrive 10 or more minutes late.  We strive to give you quality time with our providers, and arriving late affects you and other patients whose appointments are after yours.  Also, if you no show three or more times for appointments you may be dismissed from the clinic at the providers discretion.     Again, thank you for choosing [facility].  Our hope is that these requests will decrease the amount of time that you wait before being seen by our physicians.       _____________________________________________________________  Should you have questions after your visit to [facility], please contact our office at [phone] between the hours of 8:30 a.m. and 4:30 p.m.  Voicemails left after 4:30 p.m. will not be returned until the following business day.  For prescription refill requests, have your pharmacy contact our office.         Resources For Cancer Patients and their Caregivers ? American Cancer Society: Can assist with transportation, wigs, general  needs, runs Look Good Feel Better.        (256) 563-4457 ? Cancer Care: Provides financial assistance, online support groups, medication/co-pay assistance.  1-800-813-HOPE (431)359-8125) ? [facility] Assists [place] cancer patients and their families through emotional , educational and financial support.  (763)864-3143 ? [place] DSS Where to apply for food stamps, Medicaid and utility assistance. 301 080 7656 ? RCATS: Transportation to medical appointments. 224-373-4533 ? Social Security Administration: May apply for disability if have a Stage IV cancer. (854) 337-4268 364-740-8200 ? [employer], Disability and Transit Services: Assists with nutrition, care and transit needs. [place] Support Programs: @10RELATIVEDAYS @ > Cancer Support Group  2nd Tuesday of the month 1pm-2pm, Journey Room  > Creative Journey  3rd Tuesday of the month 1130am-1pm, Journey Room  > Look Good Feel Better  1st Wednesday of the month 10am-12 noon, Journey Room (Call [place] to register [phone])

## 2015-09-13 NOTE — Progress Notes (Signed)
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), one positive sentinel node, intranodal tumor deposit is 1.9cm with extracapsular extension L upper outer quadrant +LVI    Breast cancer of upper-outer quadrant of left female breast (Kristy Harris)   11/01/2014 Mammogram    Possible mass in the left breast upper outer quadrant measuring 13 mm suspicious for breast cancer confirmed through ultrasound a spiculated hypoechoic mass ill-defined, no enlarged lymph nodes     11/01/2014 Initial Diagnosis    Invasive ductal carcinoma, moderately differentiated, ER > 90%, PR> 90%, HER-2 -2+ by IHC, ratio 1.15, KI 67: 29%, T1 cN0 stage IA clinical stage     11/24/2014 Echocardiogram    Systolic function was normal. The estimated ejection fraction was in the range of 55% to 60%.      12/25/2014 Surgery    Left lumpectomy: IDC 1.7 cm, positive for LVI, with DCIS, 1/1 sentinel node positive deposit 1.9 cm with extracapsular extension, ER 90%, PR 90%, HER-2 positive ratio 2.4, Ki-67 29% T1 cN1 stage II a     02/01/2015 - 04/19/2015 Chemotherapy    Abraxane/Herceptin     03/20/2015 Echocardiogram    Systolic function wasnormal. The estimated ejection fraction was in the range of 60%to 65%.     05/08/2015 - 06/22/2015 Radiation Therapy    Dr. Lisbeth Harris      05/10/2015 -  Antibody Plan    Herceptin every 21 days to complete 52 weeks worth of therapy.     06/13/2015 Echocardiogram    2D echo- The estimated ejection fraction was in the range of 60% to 65%. Diastolic function is abnormal, indeterminate grade. Wallmotion was normal.     06/28/2015 Imaging    Bone density- BMD as determined from Femur Total Left is 0.994 g/cm2 with a T-Score of -0.1. This patient is considered normal according to [facility]) criteria.      09/12/2015 Echocardiogram    The estimated ejection fraction was in the range of 60% to 65%. Wall motion was normal; there were no regional wall motion abnormalities. Doppler parameters are consistent with abnormal L ventricular relaxation (grade 1 diastolic dysfunction).       HISTORY OF PRESENTING ILLNESS:  Kristy Harris 70 y.o. female is here for follow-up of her ER+, PR+ HER 2 neu + breast cancer. She is doing extremely well with therapy. She is on q 3 week herceptin.  [name] returns to the [place] today accompanied by her daughter. She is in a wheelchair.  She looks good today. Her hair is growing back and she notes that "it's trying."   She remarks that she's doing well on her pill, and that physical therapy has stopped working with her. When asked if it helped, she says "I mean, I'm still the same, and I don't think there's no hope." She says "I can get up and move, but it hurts like the devil." When asked about her knees, she says "they won't do any surgery because they're worried about infection." She says the  concern is mainly having to amputate her leg." She notes that she thinks this is because of her diabetes, but also because of her hidradenitis.  She notes some irritation that she believes is scar tissue under her L arm, but "that's it" in terms of complaints. She says "other than that I'm pretty good." During the physical exam, her scar tissue was confirmed under her L arm.  When asked about her appetite she says "Oh Lord have mercy." She confirms taking her pill every day, and taking calcium and vitamin  D.  She notes that she can get her arms up, demonstrating and adding "they're just fat."  She notes that she's sleeping at night except for having to get up to go to the bathroom from time to time.  For the summer plans, she might go visit her son at the beach, or visit her sister as she usually does once or twice a week.  In general, she says her mood has been good. She notes that she's "spoiling my baby," referring to her great grandchild. They say he's smiling all the time.  She notes that she's due for her mammogram next month. She denies any other concerns such as diarrhea, chest pain or palpitations.     MEDICAL HISTORY:  Past Medical History  Diagnosis Date  . Diabetes mellitus   . Morbid obesity ([place])   . Recurrent boils     of perineal and buttocks  . Hypertension   . Hyperlipidemia   . COPD (chronic obstructive pulmonary disease) ([place])   . Enlarged heart   . Anemia   . Arthritis   . Heart murmur   . Urinary frequency   . Sleep apnea     no cpap used since weight loss  . H. pylori infection   . Diverticulosis april 2016  . Hidradenitis suppurativa   . PONV (postoperative nausea and vomiting)   . Aortic stenosis     mild AS 11/24/14 echo  . Shortness of breath dyspnea     with exertion  . Pneumonia years ago  . GERD (gastroesophageal reflux disease)   . Breast cancer ([place]) 11/01/14    left breast     SURGICAL HISTORY: Past Surgical History  Procedure Laterality Date  . Pilonidal cyst excision    . Bladder suspension      x 2  . Abcess drainage      on bottom  . Cardiac catheterization  02/21/2011    Normal coronary arteries, normal EF  . Tonsillectomy  yrs ago  . Irrigation and debridement abscess Right 12/23/2012    Procedure: incision  AND DEBRIDEMENT right buttock infection ;  Surgeon: Shann Medal, MD;  Location: [facility];  Service: General;  Laterality: Right;  . Hydradenitis excision Left 12/08/2013    Procedure: EXCISION HIDRADENITIS AXILLA;   Surgeon: Alphonsa Overall, MD;  Location: [facility];  Service: General;  Laterality: Left;  . Excision hydradenitis labia N/A 12/08/2013    Procedure: EXCISION HIDRADENITIS PUBIC AREA;  Surgeon: Alphonsa Overall, MD;  Location: [facility];  Service: General;  Laterality: N/A;  . Breast lumpectomy with radioactive seed and sentinel lymph node biopsy Left 12/25/2014    Procedure: RADIOACTIVE SEED GIUDED LEFT BREAST LUMPECTOMY, LEFT AXILLARY SENTINEL LYMPH NODE BIOPSY;  Surgeon: Alphonsa Overall, MD;  Location: [facility];  Service: General;  Laterality: Left;  . Abdominal hysterectomy  25 yrs ago    partial  . Portacath placement N/A 01/12/2015    Procedure: INSERTION PORT-A-CATH;  Surgeon: Alphonsa Overall, MD;  Location: [facility];  Service: General;  Laterality: N/A;    SOCIAL HISTORY: Social History   Social History  . Marital Status: Widowed    Spouse Name: N/A  . Number of Children: 5  . Years of Education: N/A   Occupational History  . Textile work     Disabled   Social History Main Topics  . Smoking status: Former Smoker -- 1.00 packs/day for 10 years    Types: Cigarettes    Quit date: 03/03/1994  . Smokeless tobacco: Never Used  . Alcohol Use: No  . Drug Use: No  . Sexual Activity: Not on file   Other Topics Concern  . Not on file   Social History Narrative   Lives with grandson.  Widowed.  Has 2 sons and 3 daughters  Widowed 76 years 41 children, oldest is 35 yo 8 grandchildren 1 great grandchild Worked in a factory She used to enjoy fishing and play softball. Ex smoker, quit 20 years ago. ETOH, none  FAMILY HISTORY: Family History  Problem Relation Age of Onset  . Heart attack Mother     had multiple health problems  . Lung cancer Father 93    smoker  . Prostate cancer Brother   . Lung cancer Brother     dx. 46s; smoker  . Throat cancer Brother     dx. 76s; smoker  . Diabetes Sister     has 3 living sisters with multiple health problems  . Hypertension Brother   . Hypertension  Son   . Hypertension Daughter   . Breast cancer Sister 59  . Breast cancer Sister     dx. 82s  . Breast cancer Maternal Grandmother     dx. 71s  . Breast cancer Maternal Aunt     dx. older than 64  . Dementia Maternal Aunt   . Breast cancer Cousin     dx. 76s   indicated that her mother is deceased. She indicated that her father is deceased. She indicated that two of her four sisters are alive. She indicated that all of her three brothers are deceased. She indicated that her maternal grandmother is deceased. She indicated that her maternal grandfather is deceased. She indicated that her paternal grandmother is deceased. She indicated that her paternal grandfather is deceased. She indicated that her daughter is alive. She indicated that only one of her two sons is alive. She indicated that both of her maternal aunts are deceased. She indicated that her maternal uncle is deceased. She indicated that her paternal aunt is deceased. She indicated that her cousin is alive.  Mother died at 59 of a heart attack, she had a large heart and a hole in her heart. Non-smoker Father died at 62 of lung cancer, he was a smoker. 9 siblings, 4 living. One sister with breast cancer bilaterally, she is still living Another sister with breast cancer  ALLERGIES:  is allergic to lisinopril and penicillins.  MEDICATIONS:  Current Outpatient Prescriptions  Medication Sig Dispense Refill  . acetaminophen (TYLENOL) 500 MG tablet Take 1,000 mg by mouth every 6 (six) hours as needed for mild pain or headache.    . albuterol (PROVENTIL HFA;VENTOLIN HFA) 108 (90 BASE) MCG/ACT inhaler Inhale 2 puffs into the lungs every 4 (four) hours as needed for shortness of breath.    . anastrozole (ARIMIDEX) 1 MG tablet Take 1 tablet (1 mg total) by mouth daily. 30 tablet 2  . atorvastatin (LIPITOR) 20  MG tablet Take 20 mg by mouth every morning.     . bismuth subsalicylate (PEPTO BISMOL) 262 MG chewable tablet Chew 2 tablets (524  mg total) by mouth 2 (two) times daily. (Patient taking differently: Chew 524 mg by mouth 2 (two) times daily as needed for indigestion (acid reflux). ) 22 tablet 0  . Cholecalciferol (VITAMIN D-3) 5000 UNITS TABS Take 5,000 Units by mouth every morning. Reported on 07/12/2015    . ergocalciferol (VITAMIN D2) 50000 units capsule Take 1 capsule (50,000 Units total) by mouth once a week. 12 capsule 0  . ferrous sulfate 325 (65 FE) MG tablet Take 1 tablet (325 mg total) by mouth every morning. (Patient taking differently: Take 325 mg by mouth 3 (three) times daily with meals. ) 30 tablet 0  . furosemide (LASIX) 20 MG tablet TAKE ONE TABLET BY MOUTH ONCE DAILY.    Marland Kitchen glipiZIDE (GLUCOTROL XL) 10 MG 24 hr tablet Take 10 mg by mouth daily with breakfast.     . HYDROcodone-acetaminophen (NORCO/VICODIN) 5-325 MG per tablet Take 1 tablet by mouth 2 (two) times daily as needed for moderate pain.     Marland Kitchen ketoconazole (NIZORAL) 2 % cream Reported on 07/12/2015  0  . KLOR-CON M20 20 MEQ tablet TAKE 1 TABLET (20 MEQ TOTAL) BY MOUTH DAILY. 30 tablet 2  . lidocaine-prilocaine (EMLA) cream APPLY A QUARTER SIXE AMOUNT TO PORT SITE 1 HOUR PRIO TO CHEMO. DO NOT RUB IN. COVER W/ PLASTIC WRAP 30 g 3  . losartan (COZAAR) 100 MG tablet Take 100 mg by mouth daily.    . metFORMIN (GLUCOPHAGE-XR) 500 MG 24 hr tablet Take 500 mg by mouth 2 (two) times daily.     . metoprolol (TOPROL-XL) 100 MG 24 hr tablet Take 100 mg by mouth every morning.     . ondansetron (ZOFRAN) 8 MG tablet Take 1 tablet (8 mg total) by mouth every 8 (eight) hours as needed for nausea or vomiting. 30 tablet 3  . pantoprazole (PROTONIX) 40 MG tablet TAKE 1 TABLET (40 MG TOTAL) BY MOUTH DAILY. 90 tablet 0  . polyethylene glycol (MIRALAX / GLYCOLAX) packet Take 17 g by mouth daily as needed for mild constipation. 14 each 0  . prochlorperazine (COMPAZINE) 10 MG tablet Take 1 tablet (10 mg total) by mouth every 6 (six) hours as needed for nausea or vomiting. 30  tablet 2  . Trastuzumab (HERCEPTIN IV) Inject into the vein. To be given weekly    . verapamil (CALAN-SR) 120 MG CR tablet Take 120 mg by mouth daily.     No current facility-administered medications for this visit.    Review of Systems  Constitutional: Positive for malaise/fatigue. Negative for fever, chills and weight loss.  HENT: Negative.  Negative for congestion, hearing loss, nosebleeds, sore throat and tinnitus.   Eyes: Negative.  Negative for blurred vision, double vision, pain and discharge.  Respiratory: Negative.  Negative for cough, hemoptysis, sputum production, shortness of breath and wheezing.   Cardiovascular: Negative.  Negative for chest pain, palpitations, claudication, leg swelling and PND.  Gastrointestinal: Negative for heartburn, nausea, vomiting, abdominal pain, diarrhea, constipation, blood in stool and melena.  Genitourinary: Negative for dysuria, urgency, frequency and hematuria.  Musculoskeletal: Positive for joint pain. Negative for falls and myalgias.  Skin: Negative.  Negative for itching and rash.  Neurological: Positive for tingling. Negative for dizziness, tremors, sensory change, speech change, focal weakness, seizures, loss of consciousness, weakness and headaches.  Endo/Heme/Allergies: Negative.  Does not  bruise/bleed easily.  Psychiatric/Behavioral: Negative.  Negative for depression, suicidal ideas, memory loss and substance abuse. The patient is not nervous/anxious and does not have insomnia.   All other systems reviewed and are negative.  14 point ROS was done and is otherwise as detailed above or in HPI    PHYSICAL EXAMINATION: ECOG PERFORMANCE STATUS: 2 - Symptomatic, <50% confined to bed   Filed Vitals:   09/13/15 1046  BP: 144/66  Pulse: 75  Temp: 98.1 F (36.7 C)  Resp: 18   Filed Weights   09/13/15 1046  Weight: 265 lb 12.8 oz (120.566 kg)    Physical Exam  Constitutional: She is oriented to person, place, and time and  well-developed, well-nourished, and in no distress.  Hair regrowth noted  HENT:  Head: Normocephalic and atraumatic.  Nose: Nose normal.  Mouth/Throat: Oropharynx is clear and moist. No oropharyngeal exudate.  Eyes: Conjunctivae and EOM are normal. Pupils are equal, round, and reactive to light. Right eye exhibits no discharge. Left eye exhibits no discharge. No scleral icterus.  Neck: Normal range of motion. Neck supple. No tracheal deviation present. No thyromegaly present.  Cardiovascular: Normal rate and regular rhythm.  Exam reveals no gallop and no friction rub.   No murmur heard. Pulmonary/Chest: Effort normal and breath sounds normal. She has no wheezes. She has no rales.  Abdominal: Soft. Bowel sounds are normal. She exhibits no distension and no mass. There is no tenderness. There is no rebound and no guarding.  Obese  Musculoskeletal: Normal range of motion. She exhibits no edema.  Lymphadenopathy:    She has no cervical adenopathy.  Neurological: She is alert and oriented to person, place, and time. No cranial nerve deficit. Coordination normal.  Only ambulatory with assistance and walker  Skin: Skin is warm and dry. No rash noted.  Psychiatric: Mood, memory, affect and judgment normal.  Nursing note and vitals reviewed.   LABORATORY DATA:  I have reviewed the data as listed Results for ARWYN, BESAW (MRN [id number])   Ref. Range 08/23/2015 10:20  Sodium Latest Ref Range: 135 - 145 mmol/L 138  Potassium Latest Ref Range: 3.5 - 5.1 mmol/L 3.7  Chloride Latest Ref Range: 101 - 111 mmol/L 102  CO2 Latest Ref Range: 22 - 32 mmol/L 29  BUN Latest Ref Range: 6 - 20 mg/dL 16  Creatinine Latest Ref Range: 0.44 - 1.00 mg/dL 0.72  Calcium Latest Ref Range: 8.9 - 10.3 mg/dL 9.1  EGFR (Non-African Amer.) Latest Ref Range: >60 mL/min >60  EGFR (African American) Latest Ref Range: >60 mL/min >60  Glucose Latest Ref Range: 65 - 99 mg/dL 126 (H)  Anion gap Latest Ref Range: 5 - 15   7  Alkaline Phosphatase Latest Ref Range: 38 - 126 U/L 114  Albumin Latest Ref Range: 3.5 - 5.0 g/dL 3.8  AST Latest Ref Range: 15 - 41 U/L 25  ALT Latest Ref Range: 14 - 54 U/L 34  Total Protein Latest Ref Range: 6.5 - 8.1 g/dL 8.0  Total Bilirubin Latest Ref Range: 0.3 - 1.2 mg/dL 0.9  WBC Latest Ref Range: 4.0 - 10.5 K/uL 5.4  RBC Latest Ref Range: 3.87 - 5.11 MIL/uL 4.48  Hemoglobin Latest Ref Range: 12.0 - 15.0 g/dL 11.7 (L)  HCT Latest Ref Range: 36.0 - 46.0 % 37.3  MCV Latest Ref Range: 78.0 - 100.0 fL 83.3  MCH Latest Ref Range: 26.0 - 34.0 pg 26.1  MCHC Latest Ref Range: 30.0 - 36.0 g/dL 31.4  RDW Latest Ref Range: 11.5 - 15.5 % 15.2  Platelets Latest Ref Range: 150 - 400 K/uL 207  Neutrophils Latest Units: % 60  Lymphocytes Latest Units: % 31  Monocytes Relative Latest Units: % 5  Eosinophil Latest Units: % 4  Basophil Latest Units: % 0  NEUT# Latest Ref Range: 1.7 - 7.7 K/uL 3.2  Lymphocyte # Latest Ref Range: 0.7 - 4.0 K/uL 1.7  Monocyte # Latest Ref Range: 0.1 - 1.0 K/uL 0.3  Eosinophils Absolute Latest Ref Range: 0.0 - 0.7 K/uL 0.2  Basophils Absolute Latest Ref Range: 0.0 - 0.1 K/uL 0.0    RADIOLOGY: I have personally reviewed the radiological images as listed and agreed with the findings in the report.  Transthoracic Echocardiography  Patient:    Ashby, Leflore MR #:       [id number] Study Date: 09/12/2015 Gender:     F Age:        15 Height:     161.3 cm Weight:     120.2 kg BSA:        2.39 m^2 Pt. 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 She has completed XRT. She is here today for ongoing Q3week herceptin.She has normal bone density. She was counseled to take calcium 1200 mg daily and vitamin D at least 1000 to 2000 IU daily. She is on arimidex and doing well. She is to continue this daily.    Her echo is unchanged.  She is tolerating the Herceptin very well. She is due for her mammogram next month. I would like her to do 3D mammography here.  When she's done with treatment, we can talk about removing her port, since it's bothering her a  bit.  She does not think she needs refills.  We discussed referral to Survivorship today. She is agreeable. We will plan on seeing her back in 3 weeks for ongoing therapy and exam.  Orders Placed This Encounter  Procedures  . MM DIAG BREAST TOMO BILATERAL    Standing Status:   Future    Standing Expiration Date:   11/12/2016    Order Specific Question:   Reason for Exam (SYMPTOM  OR DIAGNOSIS REQUIRED)    Answer:   history breast cancer    Order Specific Question:   Preferred imaging location?    Answer:   [facility]    All questions were answered. The patient knows to call the clinic with any problems, questions or concerns.  This document serves as a record of services personally performed by Ancil Linsey, MD. It was created on her behalf by Toni Amend, a trained medical scribe. The creation of this record is based on the scribe's personal observations and the provider's statements to them. This document has been checked and approved by the attending provider.  I have reviewed the above documentation for accuracy and completeness,  and I agree with the above.  This note was electronically signed.  Molli Hazard,  MD  09/13/2015 11:13 AM

## 2015-09-29 ENCOUNTER — Other Ambulatory Visit (HOSPITAL_COMMUNITY): Payer: Self-pay | Admitting: Hematology & Oncology

## 2015-10-03 ENCOUNTER — Other Ambulatory Visit (HOSPITAL_COMMUNITY): Payer: Self-pay | Admitting: Oncology

## 2015-10-03 DIAGNOSIS — E559 Vitamin D deficiency, unspecified: Secondary | ICD-10-CM

## 2015-10-04 ENCOUNTER — Other Ambulatory Visit (HOSPITAL_COMMUNITY): Payer: Self-pay | Admitting: Hematology & Oncology

## 2015-10-04 ENCOUNTER — Encounter (HOSPITAL_COMMUNITY): Payer: Self-pay | Admitting: Oncology

## 2015-10-04 ENCOUNTER — Encounter (HOSPITAL_COMMUNITY): Payer: Medicare Other | Attending: Hematology & Oncology | Admitting: Oncology

## 2015-10-04 ENCOUNTER — Encounter (HOSPITAL_BASED_OUTPATIENT_CLINIC_OR_DEPARTMENT_OTHER): Payer: Medicare Other

## 2015-10-04 VITALS — BP 174/69 | HR 79 | Temp 98.0°F | Resp 18 | Wt 267.8 lb

## 2015-10-04 VITALS — BP 150/64 | HR 68 | Temp 97.6°F | Resp 18

## 2015-10-04 DIAGNOSIS — C50412 Malignant neoplasm of upper-outer quadrant of left female breast: Secondary | ICD-10-CM

## 2015-10-04 DIAGNOSIS — L738 Other specified follicular disorders: Secondary | ICD-10-CM | POA: Diagnosis present

## 2015-10-04 DIAGNOSIS — Z5112 Encounter for antineoplastic immunotherapy: Secondary | ICD-10-CM | POA: Diagnosis present

## 2015-10-04 DIAGNOSIS — C773 Secondary and unspecified malignant neoplasm of axilla and upper limb lymph nodes: Secondary | ICD-10-CM

## 2015-10-04 DIAGNOSIS — D649 Anemia, unspecified: Secondary | ICD-10-CM | POA: Diagnosis present

## 2015-10-04 DIAGNOSIS — D509 Iron deficiency anemia, unspecified: Secondary | ICD-10-CM | POA: Diagnosis present

## 2015-10-04 LAB — COMPREHENSIVE METABOLIC PANEL
ALBUMIN: 3.8 g/dL (ref 3.5–5.0)
ALK PHOS: 120 U/L (ref 38–126)
ALT: 45 U/L (ref 14–54)
ANION GAP: 5 (ref 5–15)
AST: 30 U/L (ref 15–41)
BILIRUBIN TOTAL: 0.8 mg/dL (ref 0.3–1.2)
BUN: 16 mg/dL (ref 6–20)
CALCIUM: 8.9 mg/dL (ref 8.9–10.3)
CO2: 28 mmol/L (ref 22–32)
Chloride: 103 mmol/L (ref 101–111)
Creatinine, Ser: 0.71 mg/dL (ref 0.44–1.00)
GFR calc Af Amer: >60 mL/min (ref >60)
GLUCOSE: 108 mg/dL — AB (ref 65–99)
POTASSIUM: 3.8 mmol/L (ref 3.5–5.1)
Sodium: 136 mmol/L (ref 135–145)
TOTAL PROTEIN: 7.8 g/dL (ref 6.5–8.1)

## 2015-10-04 LAB — CBC WITH DIFFERENTIAL/PLATELET
BASOS PCT: 0 %
Basophils Absolute: 0 10*3/uL (ref 0.0–0.1)
Eosinophils Absolute: 0.2 10*3/uL (ref 0.0–0.7)
Eosinophils Relative: 5 %
HEMATOCRIT: 34.7 % — AB (ref 36.0–46.0)
HEMOGLOBIN: 11 g/dL — AB (ref 12.0–15.0)
LYMPHS ABS: 1.4 10*3/uL (ref 0.7–4.0)
LYMPHS PCT: 33 %
MCH: 27.3 pg (ref 26.0–34.0)
MCHC: 31.7 g/dL (ref 30.0–36.0)
MCV: 86.1 fL (ref 78.0–100.0)
MONO ABS: 0.3 10*3/uL (ref 0.1–1.0)
MONOS PCT: 6 %
NEUTROS ABS: 2.4 10*3/uL (ref 1.7–7.7)
Neutrophils Relative %: 56 %
Platelets: 203 10*3/uL (ref 150–400)
RBC: 4.03 MIL/uL (ref 3.87–5.11)
RDW: 16.6 % — AB (ref 11.5–15.5)
WBC: 4.3 10*3/uL (ref 4.0–10.5)

## 2015-10-04 MED ORDER — ACETAMINOPHEN 325 MG PO TABS
650.0000 mg | ORAL_TABLET | Freq: Once | ORAL | Status: AC
  Administered 2015-10-04: 650 mg via ORAL
  Filled 2015-10-04: qty 2

## 2015-10-04 MED ORDER — SODIUM CHLORIDE 0.9 % IV SOLN
Freq: Once | INTRAVENOUS | Status: AC
  Administered 2015-10-04: 11:00:00 via INTRAVENOUS

## 2015-10-04 MED ORDER — SODIUM CHLORIDE 0.9% FLUSH: 10.0000 mL | INTRAVENOUS | Status: DC | PRN

## 2015-10-04 MED ORDER — DIPHENHYDRAMINE HCL 25 MG PO CAPS
50.0000 mg | ORAL_CAPSULE | Freq: Once | ORAL | Status: AC
  Administered 2015-10-04: 50 mg via ORAL
  Filled 2015-10-04: qty 2

## 2015-10-04 MED ORDER — TRASTUZUMAB CHEMO INJECTION 440 MG
6.0000 mg/kg | Freq: Once | INTRAVENOUS | Status: AC
  Administered 2015-10-04: 756 mg via INTRAVENOUS
  Filled 2015-10-04: qty 14.28

## 2015-10-04 MED ORDER — HEPARIN SOD (PORK) LOCK FLUSH 100 UNIT/ML IV SOLN
500.0000 [IU] | Freq: Once | INTRAVENOUS | Status: AC | PRN
  Administered 2015-10-04: 500 [IU]
  Filled 2015-10-04: qty 5

## 2015-10-04 NOTE — Assessment & Plan Note (Addendum)
Stage IIA (T1N1c) invasive ductal carcinoma of left breast, ER+/PR+/HER2+, with 1/1 sentinel lymph node for metastatic disease.  Oncology history is updated.  Pre-chemo labs today: CBC diff, CMET.   I personally reviewed and went over laboratory results with the patient.  The results are noted within this dictation.  Vit D level is ordered with her next lab appointment.  She is advised to start OTC Vit D 2000 units daily.  If her Vit D level is low in 3 weeks, will need to restart high dose Vit D replacement therapy.  She will be due for her next 2D echo the 1st week in October 2017.  Order is placed.  I personally reviewed and went over radiographic studies with the patient.  The results are noted within this dictation.    I personally reviewed and went over radiographic studies with the patient.  The results are noted within this dictation.  Mammogram is scheduled for 11/06/2015.  Return in 3 weeks for treatment.  Return in 6 weeks for labs, treatment, and follow-up appointment.

## 2015-10-04 NOTE — Progress Notes (Signed)
Tolerated infusion w/o adverse reaction.  A&Ox4, in no distress.  VSS.  Discharged via wheelchair in c/o daughter.

## 2015-10-04 NOTE — Progress Notes (Signed)
Kristy Mustache, MD [place] [address]  Breast cancer of upper-outer quadrant of left female breast ([place]) - Plan: Cholecalciferol (VITAMIN D3) 5000 units TABS, ECHOCARDIOGRAM COMPLETE, Vitamin D 25 hydroxy  CURRENT THERAPY: Herceptin and Arimidex  INTERVAL HISTORY: Kristy Harris 70 y.o. 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    Breast cancer of upper-outer quadrant of left female breast ([place])   11/01/2014 Mammogram    Possible mass in the left breast upper outer quadrant measuring 13 mm suspicious for breast cancer confirmed through ultrasound a spiculated hypoechoic mass ill-defined, no enlarged lymph nodes     11/01/2014 Initial Diagnosis    Invasive ductal carcinoma, moderately differentiated, ER > 90%, PR> 90%, HER-2 -2+ by IHC, ratio 1.15, KI 67: 29%, T1 cN0 stage IA clinical stage     11/24/2014 Echocardiogram    Systolic function was normal. The estimated ejection fraction was in the range of 55% to 60%.      12/25/2014 Surgery    Left lumpectomy: IDC 1.7 cm, positive for LVI, with DCIS, 1/1 sentinel node positive deposit 1.9 cm with extracapsular extension, ER 90%, PR 90%, HER-2 positive ratio 2.4, Ki-67 29% T1 cN1 stage II a     02/01/2015 - 04/19/2015 Chemotherapy    Abraxane/Herceptin     03/20/2015 Echocardiogram    Systolic function wasnormal. The estimated ejection fraction was in the range of 60%to 65%.     05/08/2015 - 06/22/2015 Radiation Therapy    Dr. Lisbeth Renshaw      05/10/2015 -  Antibody Plan    Herceptin every 21 days to complete 52 weeks worth of therapy.     06/13/2015 Echocardiogram    2D echo- The estimated ejection fraction was in the range of 60% to 65%. Diastolic function is abnormal, indeterminate grade. Wallmotion was normal.     06/28/2015 Imaging    Bone density- BMD as determined from Femur  Total Left is 0.994 g/cm2 with a T-Score of -0.1. This patient is considered normal according to [facility]) criteria.      09/12/2015 Echocardiogram    The estimated ejection fraction was in the range of 60% to 65%. Wall motion was normal; there were no regional wall motion abnormalities. Doppler parameters are consistent with abnormal L ventricular relaxation (grade 1 diastolic dysfunction).      She is tolerating therapy well.  She denies any complaints with her Arimidex including hot flashes, joint aches, muscle aches.  She reports compliance with Arimidex as well.  Review of Systems  Constitutional: Negative.  Negative for chills and fever.  HENT: Negative.   Eyes: Negative.   Respiratory: Negative.   Cardiovascular: Negative.   Gastrointestinal: Negative.   Genitourinary: Negative.   Musculoskeletal: Negative.  Negative for joint pain and myalgias.  Skin: Negative.   Neurological: Negative.  Negative for weakness.  Endo/Heme/Allergies: Negative.   Psychiatric/Behavioral: Negative.     Past Medical History:  Diagnosis Date  . Anemia   . Aortic stenosis    mild AS 11/24/14 echo  . Arthritis   . Breast cancer ([place]) 11/01/14   left breast   . COPD (chronic obstructive pulmonary disease) ([place])   . Diabetes mellitus   . Diverticulosis april 2016  . Enlarged heart   . GERD (gastroesophageal reflux disease)   . H. pylori infection   . Heart murmur   .  Hidradenitis suppurativa   . Hyperlipidemia   . Hypertension   . Morbid obesity ([place])   . Pneumonia years ago  . PONV (postoperative nausea and vomiting)   . Recurrent boils    of perineal and buttocks  . Shortness of breath dyspnea    with exertion  . Sleep apnea    no cpap used since weight loss  . Urinary frequency     Past Surgical History:  Procedure Laterality Date  . ABCESS DRAINAGE     on bottom  . ABDOMINAL HYSTERECTOMY  25 yrs ago   partial  . BLADDER SUSPENSION     x 2  . BREAST  LUMPECTOMY WITH RADIOACTIVE SEED AND SENTINEL LYMPH NODE BIOPSY Left 12/25/2014   Procedure: RADIOACTIVE SEED GIUDED LEFT BREAST LUMPECTOMY, LEFT AXILLARY SENTINEL LYMPH NODE BIOPSY;  Surgeon: Alphonsa Overall, MD;  Location: [place];  Service: General;  Laterality: Left;  . CARDIAC CATHETERIZATION  02/21/2011   Normal coronary arteries, normal EF  . EXCISION HYDRADENITIS LABIA N/A 12/08/2013   Procedure: EXCISION HIDRADENITIS PUBIC AREA;  Surgeon: Alphonsa Overall, MD;  Location: [facility];  Service: General;  Laterality: N/A;  . HYDRADENITIS EXCISION Left 12/08/2013   Procedure: EXCISION HIDRADENITIS AXILLA;  Surgeon: Alphonsa Overall, MD;  Location: [facility];  Service: General;  Laterality: Left;  . IRRIGATION AND DEBRIDEMENT ABSCESS Right 12/23/2012   Procedure: incision  AND DEBRIDEMENT right buttock infection ;  Surgeon: Shann Medal, MD;  Location: [facility];  Service: General;  Laterality: Right;  . PILONIDAL CYST EXCISION    . PORTACATH PLACEMENT N/A 01/12/2015   Procedure: INSERTION PORT-A-CATH;  Surgeon: Alphonsa Overall, MD;  Location: [facility];  Service: General;  Laterality: N/A;  . TONSILLECTOMY  yrs ago    Family History  Problem Relation Age of Onset  . Heart attack Mother     had multiple health problems  . Lung cancer Father 109    smoker  . Prostate cancer Brother   . Lung cancer Brother     dx. 40s; smoker  . Throat cancer Brother     dx. 66s; smoker  . Diabetes Sister     has 3 living sisters with multiple health problems  . Hypertension Son   . Hypertension Daughter   . Breast cancer Sister 28  . Breast cancer Sister     dx. 24s  . Breast cancer Maternal Grandmother     dx. 12s  . Breast cancer Maternal Aunt     dx. older than 62  . Dementia Maternal Aunt   . Breast cancer Cousin     dx. 46s  . Hypertension Brother     Social History   Social History  . Marital status: Widowed    Spouse name: N/A  . Number of children: 5  . Years of education: N/A   Occupational History    . Textile work     Disabled   Social History Main Topics  . Smoking status: Former Smoker    Packs/day: 1.00    Years: 10.00    Types: Cigarettes    Quit date: 03/03/1994  . Smokeless tobacco: Never Used  . Alcohol use No  . Drug use: No  . Sexual activity: Not Asked   Other Topics Concern  . None   Social History Narrative   Lives with grandson.  Widowed.  Has 2 sons and 3 daughters     PHYSICAL EXAMINATION  ECOG PERFORMANCE STATUS: 2 - Symptomatic, <50% confined to bed  There were no vitals filed for this visit.  BP 174/69 P 79 R 18 T 98 O2 100% on RA  GENERAL:alert, no distress, comfortable, cooperative, obese, smiling and accompanied by daughter. SKIN: skin color, texture, turgor are normal, no rashes or significant lesions HEAD: Normocephalic, No masses, lesions, tenderness or abnormalities EYES: normal, EOMI, Conjunctiva are pink and non-injected EARS: External ears normal OROPHARYNX:lips, buccal mucosa, and tongue normal and mucous membranes are moist  NECK: supple, trachea midline LYMPH:  not examined BREAST:not examined LUNGS: clear to auscultation and percussion HEART: regular rate & rhythm, no gallops, S1 normal, S2 normal and soft systolic ejection murmur 1/6. ABDOMEN:abdomen soft, non-tender, obese and normal bowel sounds BACK: Back symmetric, no curvature. EXTREMITIES:less then 2 second capillary refill, no skin discoloration, no cyanosis  NEURO: alert & oriented x 3 with fluent speech, no focal motor/sensory deficits   LABORATORY DATA: CBC    Component Value Date/Time   WBC 4.3 10/04/2015 0933   RBC 4.03 10/04/2015 0933   HGB 11.0 (L) 10/04/2015 0933   HCT 34.7 (L) 10/04/2015 0933   PLT 203 10/04/2015 0933   MCV 86.1 10/04/2015 0933   MCH 27.3 10/04/2015 0933   MCHC 31.7 10/04/2015 0933   RDW 16.6 (H) 10/04/2015 0933   LYMPHSABS 1.4 10/04/2015 0933   MONOABS 0.3 10/04/2015 0933   EOSABS 0.2 10/04/2015 0933   BASOSABS 0.0 10/04/2015 0933       Chemistry      Component Value Date/Time   NA 136 10/04/2015 0933   K 3.8 10/04/2015 0933   CL 103 10/04/2015 0933   CO2 28 10/04/2015 0933   BUN 16 10/04/2015 0933   CREATININE 0.71 10/04/2015 0933      Component Value Date/Time   CALCIUM 8.9 10/04/2015 0933   ALKPHOS 120 10/04/2015 0933   AST 30 10/04/2015 0933   ALT 45 10/04/2015 0933   BILITOT 0.8 10/04/2015 0933        PENDING LABS:   RADIOGRAPHIC STUDIES:  No results found.   PATHOLOGY:    ASSESSMENT AND PLAN:  Breast cancer of upper-outer quadrant of left female breast ([place]) Stage IIA (T1N1c) invasive ductal carcinoma of left breast, ER+/PR+/HER2+, with 1/1 sentinel lymph node for metastatic disease.  Oncology history is updated.  Pre-chemo labs today: CBC diff, CMET.   I personally reviewed and went over laboratory results with the patient.  The results are noted within this dictation.  Vit D level is ordered with her next lab appointment.  She is advised to start OTC Vit D 2000 units daily.  If her Vit D level is low in 3 weeks, will need to restart high dose Vit D replacement therapy.  She will be due for her next 2D echo the 1st week in October 2017.  Order is placed.  I personally reviewed and went over radiographic studies with the patient.  The results are noted within this dictation.    I personally reviewed and went over radiographic studies with the patient.  The results are noted within this dictation.  Mammogram is scheduled for 11/06/2015.  Return in 3 weeks for treatment.  Return in 6 weeks for labs, treatment, and follow-up appointment.   ORDERS PLACED FOR THIS ENCOUNTER: Orders Placed This Encounter  Procedures  . Vitamin D 25 hydroxy  . ECHOCARDIOGRAM COMPLETE    MEDICATIONS PRESCRIBED THIS ENCOUNTER: Meds ordered this encounter  Medications  . Cholecalciferol (VITAMIN D3) 5000 units TABS    Sig: Take by mouth.  THERAPY PLAN:  Continue with Herceptin to complete 52  weeks worth of therapy.  Compliance with Arimidex encouraged.  NCCN guidelines recommends the following surveillance for invasive breast cancer (2.2017):  A. History and Physical exam 1-4 times per year as clinically appropriate for 5 years, then annually.  B. Periodic screening for changes in family history and referral to genetics counseling as indicated  C. Educate, monitor, and refer to lymphedema management.  D. Mammography every 12 months  E. Routine imaging of reconstructed breast is not indicated.  F. In the absence of clinical signs and symptoms suggestive of recurrent disease, there is no indication for laboratory or imaging studies for metastases screening.  G. Women on Tamoxifen: annual gynecologic assessment every 12 months if uterus is present.  H. Women on aromatase inhibitor or who experience ovarian failure secondary to treatment should have monitoring of bone health with a bone mineral density determination at baseline and periodically thereafter.  I. Assess and encourage adherence to adjuvant endocrine therapy.  J. Evidence suggests that active lifestyle, healthy diet, limited alcohol intake, and achieving and maintaining an ideal body weight (20-25 BMI) may lead to optimal breast cancer outcomes.   All questions were answered. The patient knows to call the clinic with any problems, questions or concerns. We can certainly see the patient much sooner if necessary.  Patient and plan discussed with Dr. Ancil Linsey and she is in agreement with the aforementioned.   This note is electronically signed by: Doy Mince 10/04/2015 11:35 AM

## 2015-10-04 NOTE — Patient Instructions (Signed)
[facility] Discharge Instructions for Patients Receiving Chemotherapy   Beginning January 23rd 2017 lab work for the [facility] will be done in the  Main lab at [facility] on 1st floor. If you have a lab appointment with the [place] please come in thru the  Main Entrance and check in at the main information desk   Today you received the following chemotherapy agents:  Herceptin  If you develop nausea and vomiting, or diarrhea that is not controlled by your medication, call the clinic.  Exam and discussion today with Kirby Crigler, PA-C. Continue Arimidex. Start Vitamin D 2000 units daily (over the counter). Vitamin D level in 3 weeks. Return for treatment in 3 weeks. Office visit in 6 weeks. Mammogram and 2D echo as scheduled.  The clinic phone number is [phone]. Office hours are Monday-Friday 8:30am-5:00pm.  BELOW ARE SYMPTOMS THAT SHOULD BE REPORTED IMMEDIATELY:  *FEVER GREATER THAN 101.0 F  *CHILLS WITH OR WITHOUT FEVER  NAUSEA AND VOMITING THAT IS NOT CONTROLLED WITH YOUR NAUSEA MEDICATION  *UNUSUAL SHORTNESS OF BREATH  *UNUSUAL BRUISING OR BLEEDING  TENDERNESS IN MOUTH AND THROAT WITH OR WITHOUT PRESENCE OF ULCERS  *URINARY PROBLEMS  *BOWEL PROBLEMS  UNUSUAL RASH Items with * indicate a potential emergency and should be followed up as soon as possible. If you have an emergency after office hours please contact your primary care physician or go to the nearest emergency department.  Please call the clinic during office hours if you have any questions or concerns.   You may also contact the Patient Navigator at [phone] should you have any questions or need assistance in obtaining follow up care.      Resources For Cancer Patients and their Caregivers ? American Cancer Society: Can assist with transportation, wigs, general needs, runs Look Good Feel Better.        (450)858-2240 ? Cancer Care: Provides financial  assistance, online support groups, medication/co-pay assistance.  1-800-813-HOPE (437) 531-5980) ? [place] Assists [place] cancer patients and their families through emotional , educational and financial support.  [phone] ? [place] DSS Where to apply for food stamps, Medicaid and utility assistance. 639-746-4330 ? RCATS: Transportation to medical appointments. 951-768-7986 ? Social Security Administration: May apply for disability if have a Stage IV cancer. 8570208351 (337)758-1814 ? [employer], Disability and Transit Services: Assists with nutrition, care and transit needs. [phone]

## 2015-10-12 ENCOUNTER — Encounter (HOSPITAL_COMMUNITY): Payer: Self-pay | Admitting: Hematology & Oncology

## 2015-10-25 ENCOUNTER — Encounter (HOSPITAL_BASED_OUTPATIENT_CLINIC_OR_DEPARTMENT_OTHER): Payer: Medicare Other

## 2015-10-25 ENCOUNTER — Other Ambulatory Visit (HOSPITAL_COMMUNITY): Payer: Medicare Other

## 2015-10-25 VITALS — BP 152/70 | HR 76 | Temp 98.5°F | Resp 18 | Wt 269.1 lb

## 2015-10-25 DIAGNOSIS — Z5112 Encounter for antineoplastic immunotherapy: Secondary | ICD-10-CM | POA: Diagnosis present

## 2015-10-25 DIAGNOSIS — C50412 Malignant neoplasm of upper-outer quadrant of left female breast: Secondary | ICD-10-CM | POA: Diagnosis not present

## 2015-10-25 LAB — CBC WITH DIFFERENTIAL/PLATELET
BASOS ABS: 0 10*3/uL (ref 0.0–0.1)
Basophils Relative: 0 %
Eosinophils Absolute: 0.2 10*3/uL (ref 0.0–0.7)
Eosinophils Relative: 4 %
HEMATOCRIT: 35.2 % — AB (ref 36.0–46.0)
Hemoglobin: 10.9 g/dL — ABNORMAL LOW (ref 12.0–15.0)
LYMPHS PCT: 26 %
Lymphs Abs: 1.4 10*3/uL (ref 0.7–4.0)
MCH: 27 pg (ref 26.0–34.0)
MCHC: 31 g/dL (ref 30.0–36.0)
MCV: 87.3 fL (ref 78.0–100.0)
MONO ABS: 0.4 10*3/uL (ref 0.1–1.0)
Monocytes Relative: 7 %
NEUTROS ABS: 3.5 10*3/uL (ref 1.7–7.7)
Neutrophils Relative %: 63 %
Platelets: 212 10*3/uL (ref 150–400)
RBC: 4.03 MIL/uL (ref 3.87–5.11)
RDW: 15.7 % — AB (ref 11.5–15.5)
WBC: 5.5 10*3/uL (ref 4.0–10.5)

## 2015-10-25 LAB — COMPREHENSIVE METABOLIC PANEL
ALK PHOS: 116 U/L (ref 38–126)
ALT: 32 U/L (ref 14–54)
AST: 26 U/L (ref 15–41)
Albumin: 3.7 g/dL (ref 3.5–5.0)
Anion gap: 5 (ref 5–15)
BILIRUBIN TOTAL: 0.7 mg/dL (ref 0.3–1.2)
BUN: 14 mg/dL (ref 6–20)
CALCIUM: 8.9 mg/dL (ref 8.9–10.3)
CO2: 30 mmol/L (ref 22–32)
CREATININE: 0.63 mg/dL (ref 0.44–1.00)
Chloride: 101 mmol/L (ref 101–111)
GFR calc Af Amer: >60 mL/min (ref >60)
Glucose, Bld: 143 mg/dL — ABNORMAL HIGH (ref 65–99)
Potassium: 3.8 mmol/L (ref 3.5–5.1)
Sodium: 136 mmol/L (ref 135–145)
TOTAL PROTEIN: 7.7 g/dL (ref 6.5–8.1)

## 2015-10-25 MED ORDER — FAMOTIDINE IN NACL 20-0.9 MG/50ML-% IV SOLN: 20.0000 mg | Freq: Once | INTRAVENOUS | Status: DC | PRN

## 2015-10-25 MED ORDER — EPINEPHRINE HCL 0.1 MG/ML IJ SOSY: 0.2500 mg | PREFILLED_SYRINGE | Freq: Once | INTRAMUSCULAR | Status: DC | PRN

## 2015-10-25 MED ORDER — DIPHENHYDRAMINE HCL 50 MG/ML IJ SOLN: 25.0000 mg | Freq: Once | INTRAMUSCULAR | Status: DC | PRN

## 2015-10-25 MED ORDER — EPINEPHRINE HCL 1 MG/ML IJ SOLN: 0.5000 mg | Freq: Once | INTRAMUSCULAR | Status: DC | PRN

## 2015-10-25 MED ORDER — ACETAMINOPHEN 325 MG PO TABS
650.0000 mg | ORAL_TABLET | Freq: Once | ORAL | Status: AC
  Administered 2015-10-25: 650 mg via ORAL
  Filled 2015-10-25: qty 2

## 2015-10-25 MED ORDER — SODIUM CHLORIDE 0.9% FLUSH
10.0000 mL | INTRAVENOUS | Status: DC | PRN
  Administered 2015-10-25: 10 mL
  Filled 2015-10-25: qty 10

## 2015-10-25 MED ORDER — SODIUM CHLORIDE 0.9 % IV SOLN
Freq: Once | INTRAVENOUS | Status: AC
  Administered 2015-10-25: 09:00:00 via INTRAVENOUS

## 2015-10-25 MED ORDER — TRASTUZUMAB CHEMO INJECTION 440 MG
6.0000 mg/kg | Freq: Once | INTRAVENOUS | Status: AC
  Administered 2015-10-25: 756 mg via INTRAVENOUS
  Filled 2015-10-25: qty 7.14

## 2015-10-25 MED ORDER — HEPARIN SOD (PORK) LOCK FLUSH 100 UNIT/ML IV SOLN
500.0000 [IU] | Freq: Once | INTRAVENOUS | Status: AC | PRN
  Administered 2015-10-25: 500 [IU]
  Filled 2015-10-25: qty 5

## 2015-10-25 MED ORDER — ALBUTEROL SULFATE (2.5 MG/3ML) 0.083% IN NEBU: 2.5000 mg | INHALATION_SOLUTION | Freq: Once | RESPIRATORY_TRACT | Status: DC | PRN

## 2015-10-25 MED ORDER — SODIUM CHLORIDE 0.9 % IV SOLN: Freq: Once | INTRAVENOUS | Status: DC | PRN

## 2015-10-25 MED ORDER — METHYLPREDNISOLONE SODIUM SUCC 125 MG IJ SOLR: 125.0000 mg | Freq: Once | INTRAMUSCULAR | Status: DC | PRN

## 2015-10-25 MED ORDER — DIPHENHYDRAMINE HCL 50 MG/ML IJ SOLN: 50.0000 mg | Freq: Once | INTRAMUSCULAR | Status: DC | PRN

## 2015-10-25 MED ORDER — DIPHENHYDRAMINE HCL 25 MG PO CAPS
50.0000 mg | ORAL_CAPSULE | Freq: Once | ORAL | Status: AC
  Administered 2015-10-25: 50 mg via ORAL
  Filled 2015-10-25: qty 2

## 2015-10-25 NOTE — Patient Instructions (Signed)
[facility] Discharge Instructions for Patients Receiving Chemotherapy   Beginning January 23rd 2017 lab work for the Cancer Center will be done in the  Main lab at [facility] on 1st floor. If you have a lab appointment with the Cancer Center please come in thru the  Main Entrance and check in at the main information desk   Today you received the following chemotherapy agents Herceptin. Follow-up as scheduled. Call clinic for any questions or concerns  To help prevent nausea and vomiting after your treatment, we encourage you to take your nausea medication.   If you develop nausea and vomiting, or diarrhea that is not controlled by your medication, call the clinic.  The clinic phone number is [phone]. Office hours are Monday-Friday 8:30am-5:00pm.  BELOW ARE SYMPTOMS THAT SHOULD BE REPORTED IMMEDIATELY:  *FEVER GREATER THAN 101.0 F  *CHILLS WITH OR WITHOUT FEVER  NAUSEA AND VOMITING THAT IS NOT CONTROLLED WITH YOUR NAUSEA MEDICATION  *UNUSUAL SHORTNESS OF BREATH  *UNUSUAL BRUISING OR BLEEDING  TENDERNESS IN MOUTH AND THROAT WITH OR WITHOUT PRESENCE OF ULCERS  *URINARY PROBLEMS  *BOWEL PROBLEMS  UNUSUAL RASH Items with * indicate a potential emergency and should be followed up as soon as possible. If you have an emergency after office hours please contact your primary care physician or go to the nearest emergency department.  Please call the clinic during office hours if you have any questions or concerns.   You may also contact the Patient Navigator at [phone] should you have any questions or need assistance in obtaining follow up care.      Resources For Cancer Patients and their Caregivers ? American Cancer Society: Can assist with transportation, wigs, general needs, runs Look Good Feel Better.        1-888-227-6333 ? Cancer Care: Provides financial assistance, online support groups, medication/co-pay assistance.  1-800-813-HOPE  (4673) ? [facility] Assists [place] cancer patients and their families through emotional , educational and financial support.  336-427-4357 ? [place] DSS Where to apply for food stamps, Medicaid and utility assistance. 336-342-1394 ? RCATS: Transportation to medical appointments. 336-347-2287 ? Social Security Administration: May apply for disability if have a Stage IV cancer. 336-342-7796 1-800-772-1213 ? [place] Aging, Disability and Transit Services: Assists with nutrition, care and transit needs. [phone]         

## 2015-10-25 NOTE — Progress Notes (Signed)
Avagail L Schuessler tolerated chemo tx well without complaints. VSS upon discharged. Pt discharged via wheelchair in satisfactory condition with family

## 2015-10-26 LAB — VITAMIN D 25 HYDROXY (VIT D DEFICIENCY, FRACTURES): Vit D, 25-Hydroxy: 37.1 ng/mL (ref 30.0–100.0)

## 2015-10-29 ENCOUNTER — Other Ambulatory Visit (HOSPITAL_COMMUNITY): Payer: Self-pay | Admitting: Hematology & Oncology

## 2015-10-29 DIAGNOSIS — Z9889 Other specified postprocedural states: Secondary | ICD-10-CM

## 2015-10-29 DIAGNOSIS — Z853 Personal history of malignant neoplasm of breast: Secondary | ICD-10-CM

## 2015-11-06 ENCOUNTER — Encounter (HOSPITAL_COMMUNITY): Payer: Medicare Other

## 2015-11-06 ENCOUNTER — Ambulatory Visit (HOSPITAL_COMMUNITY)
Admission: RE | Admit: 2015-11-06 | Discharge: 2015-11-06 | Disposition: A | Discharge status: Home / Self Care | Payer: Medicare Other | Source: Ambulatory Visit | Attending: Hematology & Oncology | Admitting: Hematology & Oncology

## 2015-11-06 DIAGNOSIS — Z853 Personal history of malignant neoplasm of breast: Secondary | ICD-10-CM | POA: Diagnosis not present

## 2015-11-15 ENCOUNTER — Encounter (HOSPITAL_COMMUNITY): Payer: Self-pay | Admitting: Oncology

## 2015-11-15 ENCOUNTER — Encounter (HOSPITAL_BASED_OUTPATIENT_CLINIC_OR_DEPARTMENT_OTHER): Payer: Medicare Other

## 2015-11-15 ENCOUNTER — Encounter (HOSPITAL_COMMUNITY): Payer: Medicare Other | Attending: Hematology & Oncology | Admitting: Oncology

## 2015-11-15 ENCOUNTER — Ambulatory Visit (HOSPITAL_COMMUNITY): Payer: Medicare Other

## 2015-11-15 VITALS — BP 144/59 | HR 73 | Temp 97.9°F | Resp 18

## 2015-11-15 DIAGNOSIS — D509 Iron deficiency anemia, unspecified: Secondary | ICD-10-CM | POA: Diagnosis present

## 2015-11-15 DIAGNOSIS — R609 Edema, unspecified: Secondary | ICD-10-CM | POA: Diagnosis not present

## 2015-11-15 DIAGNOSIS — Z5112 Encounter for antineoplastic immunotherapy: Secondary | ICD-10-CM | POA: Diagnosis present

## 2015-11-15 DIAGNOSIS — C50412 Malignant neoplasm of upper-outer quadrant of left female breast: Secondary | ICD-10-CM

## 2015-11-15 DIAGNOSIS — C773 Secondary and unspecified malignant neoplasm of axilla and upper limb lymph nodes: Secondary | ICD-10-CM

## 2015-11-15 DIAGNOSIS — D649 Anemia, unspecified: Secondary | ICD-10-CM | POA: Diagnosis present

## 2015-11-15 DIAGNOSIS — L738 Other specified follicular disorders: Secondary | ICD-10-CM | POA: Diagnosis present

## 2015-11-15 LAB — COMPREHENSIVE METABOLIC PANEL
ALT: 43 U/L (ref 14–54)
AST: 34 U/L (ref 15–41)
Albumin: 3.4 g/dL — ABNORMAL LOW (ref 3.5–5.0)
Alkaline Phosphatase: 107 U/L (ref 38–126)
Anion gap: 8 (ref 5–15)
BUN: 12 mg/dL (ref 6–20)
CHLORIDE: 101 mmol/L (ref 101–111)
CO2: 30 mmol/L (ref 22–32)
Calcium: 8.8 mg/dL — ABNORMAL LOW (ref 8.9–10.3)
Creatinine, Ser: 0.67 mg/dL (ref 0.44–1.00)
Glucose, Bld: 110 mg/dL — ABNORMAL HIGH (ref 65–99)
POTASSIUM: 3.5 mmol/L (ref 3.5–5.1)
Sodium: 139 mmol/L (ref 135–145)
Total Bilirubin: 0.3 mg/dL (ref 0.3–1.2)
Total Protein: 7.1 g/dL (ref 6.5–8.1)

## 2015-11-15 LAB — CBC WITH DIFFERENTIAL/PLATELET
Basophils Absolute: 0 10*3/uL (ref 0.0–0.1)
Basophils Relative: 0 %
EOS PCT: 4 %
Eosinophils Absolute: 0.2 10*3/uL (ref 0.0–0.7)
HCT: 32.3 % — ABNORMAL LOW (ref 36.0–46.0)
Hemoglobin: 10.2 g/dL — ABNORMAL LOW (ref 12.0–15.0)
LYMPHS ABS: 1.2 10*3/uL (ref 0.7–4.0)
LYMPHS PCT: 28 %
MCH: 27.8 pg (ref 26.0–34.0)
MCHC: 31.6 g/dL (ref 30.0–36.0)
MCV: 88 fL (ref 78.0–100.0)
MONO ABS: 0.3 10*3/uL (ref 0.1–1.0)
Monocytes Relative: 8 %
Neutro Abs: 2.6 10*3/uL (ref 1.7–7.7)
Neutrophils Relative %: 60 %
PLATELETS: 193 10*3/uL (ref 150–400)
RBC: 3.67 MIL/uL — ABNORMAL LOW (ref 3.87–5.11)
RDW: 14.6 % (ref 11.5–15.5)
WBC: 4.4 10*3/uL (ref 4.0–10.5)

## 2015-11-15 MED ORDER — ACETAMINOPHEN 325 MG PO TABS
650.0000 mg | ORAL_TABLET | Freq: Once | ORAL | Status: AC
  Administered 2015-11-15: 650 mg via ORAL

## 2015-11-15 MED ORDER — DIPHENHYDRAMINE HCL 25 MG PO CAPS
50.0000 mg | ORAL_CAPSULE | Freq: Once | ORAL | Status: AC
  Administered 2015-11-15: 50 mg via ORAL

## 2015-11-15 MED ORDER — ACETAMINOPHEN 325 MG PO TABS
ORAL_TABLET | ORAL | Status: AC
  Filled 2015-11-15: qty 2

## 2015-11-15 MED ORDER — DIPHENHYDRAMINE HCL 25 MG PO CAPS
ORAL_CAPSULE | ORAL | Status: AC
  Filled 2015-11-15: qty 2

## 2015-11-15 MED ORDER — TRASTUZUMAB CHEMO INJECTION 440 MG
6.0000 mg/kg | Freq: Once | INTRAVENOUS | Status: AC
  Administered 2015-11-15: 756 mg via INTRAVENOUS
  Filled 2015-11-15: qty 14.29

## 2015-11-15 MED ORDER — SODIUM CHLORIDE 0.9 % IV SOLN
Freq: Once | INTRAVENOUS | Status: AC
  Administered 2015-11-15: 10:00:00 via INTRAVENOUS

## 2015-11-15 MED ORDER — HEPARIN SOD (PORK) LOCK FLUSH 100 UNIT/ML IV SOLN
500.0000 [IU] | Freq: Once | INTRAVENOUS | Status: AC | PRN
  Administered 2015-11-15: 500 [IU]
  Filled 2015-11-15: qty 5

## 2015-11-15 MED ORDER — SODIUM CHLORIDE 0.9% FLUSH: 10.0000 mL | INTRAVENOUS | Status: DC | PRN

## 2015-11-15 NOTE — Patient Instructions (Signed)
[place] at [facility] Discharge Instructions  RECOMMENDATIONS MADE BY THE CONSULTANT AND ANY TEST RESULTS WILL BE SENT TO YOUR REFERRING PHYSICIAN.  You were seen by Gershon Mussel today. 2-D echo on 10/3 Return in 3 weeks for Herceptin Continue Arimidex Daily Return in 6 week for Herceptin and Follow Up  Thank you for choosing [place] at [facility] to provide your oncology and hematology care.  To afford each patient quality time with our provider, please arrive at least 15 minutes before your scheduled appointment time.   Beginning January 23rd 2017 lab work for the [facility] will be done in the  Main lab at [facility] on 1st floor. If you have a lab appointment with the [place] please come in thru the  Main Entrance and check in at the main information desk  You need to re-schedule your appointment should you arrive 10 or more minutes late.  We strive to give you quality time with our providers, and arriving late affects you and other patients whose appointments are after yours.  Also, if you no show three or more times for appointments you may be dismissed from the clinic at the providers discretion.     Again, thank you for choosing [facility].  Our hope is that these requests will decrease the amount of time that you wait before being seen by our physicians.       _____________________________________________________________  Should you have questions after your visit to [facility], please contact our office at [phone] between the hours of 8:30 a.m. and 4:30 p.m.  Voicemails left after 4:30 p.m. will not be returned until the following business day.  For prescription refill requests, have your pharmacy contact our office.         Resources For Cancer Patients and their Caregivers ? American Cancer Society: Can assist with transportation, wigs, general needs, runs Look Good Feel Better.         351-824-6503 ? Cancer Care: Provides financial assistance, online support groups, medication/co-pay assistance.  1-800-813-HOPE 443-449-1010) ? [place] Assists [place] cancer patients and their families through emotional , educational and financial support.  (304)508-5232 ? [place] DSS Where to apply for food stamps, Medicaid and utility assistance. 563-221-4943 ? RCATS: Transportation to medical appointments. 858-482-0535 ? Social Security Administration: May apply for disability if have a Stage IV cancer. ([phone] 3671366826 ? [employer], Disability and Transit Services: Assists with nutrition, care and transit needs. [place] Support Programs: @10RELATIVEDAYS @ > Cancer Support Group  2nd Tuesday of the month 1pm-2pm, Journey Room  > Creative Journey  3rd Tuesday of the month 1130am-1pm, Journey Room  > Look Good Feel Better  1st Wednesday of the month 10am-12 noon, Journey Room (Call [place] to register [phone])

## 2015-11-15 NOTE — Patient Instructions (Signed)
[facility] Discharge Instructions for Patients Receiving Chemotherapy   Beginning January 23rd 2017 lab work for the [facility] will be done in the  Main lab at [facility] on 1st floor. If you have a lab appointment with the [place] please come in thru the  Main Entrance and check in at the main information desk   Today you received the following chemotherapy agent: Herceptin today.     If you develop nausea and vomiting, or diarrhea that is not controlled by your medication, call the clinic.  The clinic phone number is [phone]. Office hours are Monday-Friday 8:30am-5:00pm.  BELOW ARE SYMPTOMS THAT SHOULD BE REPORTED IMMEDIATELY:  *FEVER GREATER THAN 101.0 F  *CHILLS WITH OR WITHOUT FEVER  NAUSEA AND VOMITING THAT IS NOT CONTROLLED WITH YOUR NAUSEA MEDICATION  *UNUSUAL SHORTNESS OF BREATH  *UNUSUAL BRUISING OR BLEEDING  TENDERNESS IN MOUTH AND THROAT WITH OR WITHOUT PRESENCE OF ULCERS  *URINARY PROBLEMS  *BOWEL PROBLEMS  UNUSUAL RASH Items with * indicate a potential emergency and should be followed up as soon as possible. If you have an emergency after office hours please contact your primary care physician or go to the nearest emergency department.  Please call the clinic during office hours if you have any questions or concerns.   You may also contact the Patient Navigator at [phone] should you have any questions or need assistance in obtaining follow up care.      Resources For Cancer Patients and their Caregivers ? American Cancer Society: Can assist with transportation, wigs, general needs, runs Look Good Feel Better.        (857)544-3699 ? Cancer Care: Provides financial assistance, online support groups, medication/co-pay assistance.  1-800-813-HOPE 909-535-2384) ? [place] Assists [place] cancer patients and their families through emotional , educational and financial support.   662-030-4282 ? [place] DSS Where to apply for food stamps, Medicaid and utility assistance. 765-141-8039 ? RCATS: Transportation to medical appointments. (484)656-5193 ? Social Security Administration: May apply for disability if have a Stage IV cancer. (819) 778-2894 847-867-5107 ? [employer], Disability and Transit Services: Assists with nutrition, care and transit needs. [phone]

## 2015-11-15 NOTE — Progress Notes (Signed)
Kristy Mustache, MD [place] [address]  Breast cancer of upper-outer quadrant of left female breast [facility])  CURRENT THERAPY: Herceptin and Arimidex  INTERVAL HISTORY: Kristy Harris 70 y.o. 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    Breast cancer of upper-outer quadrant of left female breast ([place])   11/01/2014 Mammogram    Possible mass in the left breast upper outer quadrant measuring 13 mm suspicious for breast cancer confirmed through ultrasound a spiculated hypoechoic mass ill-defined, no enlarged lymph nodes      11/01/2014 Initial Diagnosis    Invasive ductal carcinoma, moderately differentiated, ER > 90%, PR> 90%, HER-2 -2+ by IHC, ratio 1.15, KI 67: 29%, T1 cN0 stage IA clinical stage      11/24/2014 Echocardiogram    Systolic function was normal. The estimated ejection fraction was in the range of 55% to 60%.       12/25/2014 Surgery    Left lumpectomy: IDC 1.7 cm, positive for LVI, with DCIS, 1/1 sentinel node positive deposit 1.9 cm with extracapsular extension, ER 90%, PR 90%, HER-2 positive ratio 2.4, Ki-67 29% T1 cN1 stage II a      02/01/2015 - 04/19/2015 Chemotherapy    Abraxane/Herceptin      03/20/2015 Echocardiogram    Systolic function wasnormal. The estimated ejection fraction was in the range of 60%to 65%.      05/08/2015 - 06/22/2015 Radiation Therapy    Dr. Lisbeth Renshaw       05/10/2015 -  Antibody Plan    Herceptin every 21 days to complete 52 weeks worth of therapy.      06/13/2015 Echocardiogram    2D echo- The estimated ejection fraction was in the range of 60% to 65%. Diastolic function is abnormal, indeterminate grade. Wallmotion was normal.      06/28/2015 Imaging    Bone density- BMD as determined from Femur Total Left is 0.994 g/cm2 with a T-Score of -0.1. This patient is considered normal  according to [facility]) criteria.       09/12/2015 Echocardiogram    The estimated ejection fraction was in the range of 60% to 65%. Wall motion was normal; there were no regional wall motion abnormalities. Doppler parameters are consistent with abnormal L ventricular relaxation (grade 1 diastolic dysfunction).       She is tolerating therapy well.  She denies any complaints with her Arimidex including hot flashes, joint aches, muscle aches.  She reports compliance with Arimidex as well.  Her birthday is today and she is wished a happy birthday.  She is nearing completion of Herceptin and tolerating very well.  She reports some lower extremity/pedal edema. On exam is unimpressive.  Review of Systems  Constitutional: Negative.  Negative for chills, fever and weight loss.  HENT: Negative.   Eyes: Negative.  Negative for blurred vision and double vision.  Respiratory: Negative.  Negative for cough, sputum production and shortness of breath.   Cardiovascular: Positive for leg swelling. Negative for chest pain.  Gastrointestinal: Negative.  Negative for blood in stool, constipation, diarrhea, nausea and vomiting.  Genitourinary: Negative.   Musculoskeletal: Negative.   Skin: Negative.   Neurological: Negative.  Negative for weakness and headaches.  Endo/Heme/Allergies: Negative.   Psychiatric/Behavioral: Negative.     Past Medical History:  Diagnosis Date  . Anemia   . Aortic stenosis  mild AS 11/24/14 echo  . Arthritis   . Breast cancer ([place]) 11/01/14   left breast   . COPD (chronic obstructive pulmonary disease) ([place])   . Diabetes mellitus   . Diverticulosis april 2016  . Enlarged heart   . GERD (gastroesophageal reflux disease)   . H. pylori infection   . Heart murmur   . Hidradenitis suppurativa   . Hyperlipidemia   . Hypertension   . Morbid obesity ([place])   . Pneumonia years ago  . PONV (postoperative nausea and vomiting)   . Recurrent boils     of perineal and buttocks  . Shortness of breath dyspnea    with exertion  . Sleep apnea    no cpap used since weight loss  . Urinary frequency     Past Surgical History:  Procedure Laterality Date  . ABCESS DRAINAGE     on bottom  . ABDOMINAL HYSTERECTOMY  25 yrs ago   partial  . BLADDER SUSPENSION     x 2  . BREAST LUMPECTOMY WITH RADIOACTIVE SEED AND SENTINEL LYMPH NODE BIOPSY Left 12/25/2014   Procedure: RADIOACTIVE SEED GIUDED LEFT BREAST LUMPECTOMY, LEFT AXILLARY SENTINEL LYMPH NODE BIOPSY;  Surgeon: Alphonsa Overall, MD;  Location: [facility];  Service: General;  Laterality: Left;  . CARDIAC CATHETERIZATION  02/21/2011   Normal coronary arteries, normal EF  . EXCISION HYDRADENITIS LABIA N/A 12/08/2013   Procedure: EXCISION HIDRADENITIS PUBIC AREA;  Surgeon: Alphonsa Overall, MD;  Location: [facility];  Service: General;  Laterality: N/A;  . HYDRADENITIS EXCISION Left 12/08/2013   Procedure: EXCISION HIDRADENITIS AXILLA;  Surgeon: Alphonsa Overall, MD;  Location: [facility];  Service: General;  Laterality: Left;  . IRRIGATION AND DEBRIDEMENT ABSCESS Right 12/23/2012   Procedure: incision  AND DEBRIDEMENT right buttock infection ;  Surgeon: Shann Medal, MD;  Location: [facility];  Service: General;  Laterality: Right;  . PILONIDAL CYST EXCISION    . PORTACATH PLACEMENT N/A 01/12/2015   Procedure: INSERTION PORT-A-CATH;  Surgeon: Alphonsa Overall, MD;  Location: [facility];  Service: General;  Laterality: N/A;  . TONSILLECTOMY  yrs ago    Family History  Problem Relation Age of Onset  . Heart attack Mother     had multiple health problems  . Lung cancer Father 58    smoker  . Prostate cancer Brother   . Lung cancer Brother     dx. 41s; smoker  . Throat cancer Brother     dx. 4s; smoker  . Diabetes Sister     has 3 living sisters with multiple health problems  . Hypertension Son   . Hypertension Daughter   . Breast cancer Sister 11  . Breast cancer Sister     dx. 59s  . Breast cancer Maternal  Grandmother     dx. 36s  . Breast cancer Maternal Aunt     dx. older than 68  . Dementia Maternal Aunt   . Breast cancer Cousin     dx. 33s  . Hypertension Brother     Social History   Social History  . Marital status: Widowed    Spouse name: N/A  . Number of children: 5  . Years of education: N/A   Occupational History  . Textile work     Disabled   Social History Main Topics  . Smoking status: Former Smoker    Packs/day: 1.00    Years: 10.00    Types: Cigarettes    Quit date: 03/03/1994  . Smokeless tobacco:  Never Used  . Alcohol use No  . Drug use: No  . Sexual activity: Not Asked   Other Topics Concern  . None   Social History Narrative   Lives with grandson.  Widowed.  Has 2 sons and 3 daughters     PHYSICAL EXAMINATION  ECOG PERFORMANCE STATUS: 2 - Symptomatic, <50% confined to bed  Vitals:   11/15/15 0900  BP: (!) 134/49  Pulse: 73  Resp: 18  Temp: 98.3 F (36.8 C)     GENERAL:alert, no distress, comfortable, cooperative, obese, smiling and accompanied by daughter. SKIN: skin color, texture, turgor are normal, no rashes or significant lesions HEAD: Normocephalic, No masses, lesions, tenderness or abnormalities EYES: normal, EOMI, Conjunctiva are pink and non-injected EARS: External ears normal OROPHARYNX:lips, buccal mucosa, and tongue normal and mucous membranes are moist  NECK: supple, trachea midline LYMPH:  not examined BREAST:not examined LUNGS: clear to auscultation and percussion HEART: regular rate & rhythm, no gallops, S1 normal, S2 normal and soft systolic ejection murmur 1/6. ABDOMEN:abdomen soft, non-tender, obese and normal bowel sounds BACK: Back symmetric, no curvature. EXTREMITIES:less then 2 second capillary refill, no skin discoloration, no cyanosis, minimal bilateral trace pitting edema in lower extremities. NEURO: alert & oriented x 3 with fluent speech, no focal motor/sensory deficits   LABORATORY DATA: CBC      Component Value Date/Time   WBC 4.4 11/15/2015 1109   RBC 3.67 (L) 11/15/2015 1109   HGB 10.2 (L) 11/15/2015 1109   HCT 32.3 (L) 11/15/2015 1109   PLT 193 11/15/2015 1109   MCV 88.0 11/15/2015 1109   MCH 27.8 11/15/2015 1109   MCHC 31.6 11/15/2015 1109   RDW 14.6 11/15/2015 1109   LYMPHSABS 1.2 11/15/2015 1109   MONOABS 0.3 11/15/2015 1109   EOSABS 0.2 11/15/2015 1109   BASOSABS 0.0 11/15/2015 1109      Chemistry      Component Value Date/Time   NA 139 11/15/2015 1109   K 3.5 11/15/2015 1109   CL 101 11/15/2015 1109   CO2 30 11/15/2015 1109   BUN 12 11/15/2015 1109   CREATININE 0.67 11/15/2015 1109      Component Value Date/Time   CALCIUM 8.8 (L) 11/15/2015 1109   ALKPHOS 107 11/15/2015 1109   AST 34 11/15/2015 1109   ALT 43 11/15/2015 1109   BILITOT 0.3 11/15/2015 1109        PENDING LABS:   RADIOGRAPHIC STUDIES:  Mm Diag Breast Tomo Bilateral  Result Date: 11/06/2015 CLINICAL DATA:  History of malignant lumpectomy of the left breast in 2016. Follow-up evaluation. EXAM: 2D DIGITAL DIAGNOSTIC BILATERAL MAMMOGRAM WITH CAD AND ADJUNCT TOMO COMPARISON:  Previous examinations the most recent of which is dated 11/01/2014. ACR Breast Density Category b: There are scattered areas of fibroglandular density. FINDINGS: There are scarring changes located within the upper-outer quadrant of the left breast related to the patient's lumpectomy. There is skin thickening associated with the left breast related to the patient's radiation therapy. A Port-A-Cath overlies the superior right breast. There are no findings worrisome for recurrent tumor or developing malignancy within either breast. Mammographic images were processed with CAD. IMPRESSION: No findings worrisome for developing malignancy. RECOMMENDATION: Bilateral diagnostic mammography in 1 year. I have discussed the findings and recommendations with the patient. Results were also provided in writing at the conclusion of the  visit. If applicable, a reminder letter will be sent to the patient regarding the next appointment. BI-RADS CATEGORY  1: Negative. Electronically Signed   By:  Altamese Cabal M.D.   On: 11/06/2015 10:16     PATHOLOGY:    ASSESSMENT AND PLAN:  Breast cancer of upper-outer quadrant of left female breast ([place]) Stage IIA (T1N1c) invasive ductal carcinoma of left breast, ER+/PR+/HER2+, with 1/1 sentinel lymph node for metastatic disease.  Oncology history is updated.  Pre-chemo labs today: CBC diff, CMET.   I personally reviewed and went over laboratory results with the patient.  The results are noted within this dictation.    I personally reviewed and went over radiographic studies with the patient.  The results are noted within this dictation.  Mammogram on 11/06/2015 was NEGATIVE.  2D echo is scheduled for 12/04/2015.  She notes an increase in her LE edema.  She is on Lasix 20 mg daily.  It would be reasonable to increase to 40 mg daily for a few days.  She has an upcoming appointment with her primary care provider who also prescribes her lasix.  I have asked her to speak with Dr. Edrick Oh about that.  She declines influenza vaccine today and will have this performed at Dr. Murrell Redden office too.  She SHOULD get the influenza vaccine.  Return in 3 weeks for treatment.  Return in 6 weeks for labs, treatment, and follow-up appointment.   ORDERS PLACED FOR THIS ENCOUNTER: No orders of the defined types were placed in this encounter.   MEDICATIONS PRESCRIBED THIS ENCOUNTER: Meds ordered this encounter  Medications  . atorvastatin (LIPITOR) 20 MG tablet    Sig: TAKE ONE (1) TABLET EACH DAY  . metoprolol succinate (TOPROL-XL) 100 MG 24 hr tablet    Sig: TAKE ONE (1) TABLET EACH DAY  . verapamil (CALAN-SR) 120 MG CR tablet    Sig: TAKE ONE (1) TABLET EACH DAY  . HYDROcodone-acetaminophen (NORCO) 7.5-325 MG tablet  . sodium chloride irrigation 0.9 % irrigation    THERAPY PLAN:  Continue  with Herceptin to complete 52 weeks worth of therapy.  Compliance with Arimidex encouraged.  NCCN guidelines recommends the following surveillance for invasive breast cancer (2.2017):  A. History and Physical exam 1-4 times per year as clinically appropriate for 5 years, then annually.  B. Periodic screening for changes in family history and referral to genetics counseling as indicated  C. Educate, monitor, and refer to lymphedema management.  D. Mammography every 12 months  E. Routine imaging of reconstructed breast is not indicated.  F. In the absence of clinical signs and symptoms suggestive of recurrent disease, there is no indication for laboratory or imaging studies for metastases screening.  G. Women on Tamoxifen: annual gynecologic assessment every 12 months if uterus is present.  H. Women on aromatase inhibitor or who experience ovarian failure secondary to treatment should have monitoring of bone health with a bone mineral density determination at baseline and periodically thereafter.  I. Assess and encourage adherence to adjuvant endocrine therapy.  J. Evidence suggests that active lifestyle, healthy diet, limited alcohol intake, and achieving and maintaining an ideal body weight (20-25 BMI) may lead to optimal breast cancer outcomes.   All questions were answered. The patient knows to call the clinic with any problems, questions or concerns. We can certainly see the patient much sooner if necessary.  Patient and plan discussed with Dr. Ancil Linsey and she is in agreement with the aforementioned.   This note is electronically signed by: Kristy Harris 11/15/2015 10:12 PM

## 2015-11-15 NOTE — Assessment & Plan Note (Addendum)
Stage IIA (T1N1c) invasive ductal carcinoma of left breast, ER+/PR+/HER2+, with 1/1 sentinel lymph node for metastatic disease.  Oncology history is updated.  Pre-chemo labs today: CBC diff, CMET.   I personally reviewed and went over laboratory results with the patient.  The results are noted within this dictation.    I personally reviewed and went over radiographic studies with the patient.  The results are noted within this dictation.  Mammogram on 11/06/2015 was NEGATIVE.  2D echo is scheduled for 12/04/2015.  She notes an increase in her LE edema.  She is on Lasix 20 mg daily.  It would be reasonable to increase to 40 mg daily for a few days.  She has an upcoming appointment with her primary care provider who also prescribes her lasix.  I have asked her to speak with Dr. Edrick Oh about that.  She declines influenza vaccine today and will have this performed at Dr. Murrell Redden office too.  She SHOULD get the influenza vaccine.  Return in 3 weeks for treatment.  Return in 6 weeks for labs, treatment, and follow-up appointment.

## 2015-11-15 NOTE — Progress Notes (Signed)
Patient prefers to get  Her flu vaccination from her PCP later this season.  Patient tolerated infusion well.  VSS.  Patient stable and wheeled out via wheelchair by family upon discharge from clinic.

## 2015-12-03 ENCOUNTER — Other Ambulatory Visit (HOSPITAL_COMMUNITY): Payer: Self-pay

## 2015-12-03 DIAGNOSIS — C50412 Malignant neoplasm of upper-outer quadrant of left female breast: Secondary | ICD-10-CM

## 2015-12-03 MED ORDER — ANASTROZOLE 1 MG PO TABS: 1.0000 mg | ORAL_TABLET | Freq: Every day | ORAL | 1 refills | Status: DC

## 2015-12-04 ENCOUNTER — Ambulatory Visit (HOSPITAL_COMMUNITY)
Admission: RE | Admit: 2015-12-04 | Discharge: 2015-12-04 | Disposition: A | Discharge status: Home / Self Care | Payer: Medicare Other | Source: Ambulatory Visit | Attending: Oncology | Admitting: Oncology

## 2015-12-04 DIAGNOSIS — I350 Nonrheumatic aortic (valve) stenosis: Secondary | ICD-10-CM | POA: Diagnosis not present

## 2015-12-04 DIAGNOSIS — I358 Other nonrheumatic aortic valve disorders: Secondary | ICD-10-CM | POA: Diagnosis not present

## 2015-12-04 DIAGNOSIS — Z9221 Personal history of antineoplastic chemotherapy: Secondary | ICD-10-CM | POA: Diagnosis not present

## 2015-12-04 DIAGNOSIS — C50412 Malignant neoplasm of upper-outer quadrant of left female breast: Secondary | ICD-10-CM

## 2015-12-04 DIAGNOSIS — I342 Nonrheumatic mitral (valve) stenosis: Secondary | ICD-10-CM | POA: Diagnosis not present

## 2015-12-04 DIAGNOSIS — I517 Cardiomegaly: Secondary | ICD-10-CM | POA: Insufficient documentation

## 2015-12-04 NOTE — Progress Notes (Signed)
*  PRELIMINARY RESULTS* Echocardiogram 2D Echocardiogram has been performed.  Kristy Harris 12/04/2015, 10:34 AM

## 2015-12-05 ENCOUNTER — Telehealth (HOSPITAL_COMMUNITY): Payer: Self-pay | Admitting: *Deleted

## 2015-12-05 NOTE — Telephone Encounter (Signed)
Pt aware that Echo is stable.

## 2015-12-05 NOTE — Telephone Encounter (Signed)
-----   Message from [facility], PA-C sent at 12/04/2015  5:07 PM EDT ----- Stable.

## 2015-12-06 ENCOUNTER — Encounter (HOSPITAL_COMMUNITY): Payer: Self-pay

## 2015-12-06 ENCOUNTER — Encounter (HOSPITAL_COMMUNITY): Payer: Medicare Other | Attending: Hematology & Oncology

## 2015-12-06 VITALS — BP 130/59 | HR 68 | Temp 97.4°F | Resp 18 | Wt 270.0 lb

## 2015-12-06 DIAGNOSIS — L738 Other specified follicular disorders: Secondary | ICD-10-CM | POA: Insufficient documentation

## 2015-12-06 DIAGNOSIS — Z5112 Encounter for antineoplastic immunotherapy: Secondary | ICD-10-CM | POA: Diagnosis present

## 2015-12-06 DIAGNOSIS — C50412 Malignant neoplasm of upper-outer quadrant of left female breast: Secondary | ICD-10-CM | POA: Diagnosis not present

## 2015-12-06 DIAGNOSIS — D509 Iron deficiency anemia, unspecified: Secondary | ICD-10-CM | POA: Insufficient documentation

## 2015-12-06 DIAGNOSIS — D649 Anemia, unspecified: Secondary | ICD-10-CM | POA: Insufficient documentation

## 2015-12-06 DIAGNOSIS — Z170 Estrogen receptor positive status [ER+]: Secondary | ICD-10-CM

## 2015-12-06 MED ORDER — DIPHENHYDRAMINE HCL 25 MG PO CAPS
50.0000 mg | ORAL_CAPSULE | Freq: Once | ORAL | Status: AC
  Administered 2015-12-06: 50 mg via ORAL

## 2015-12-06 MED ORDER — HEPARIN SOD (PORK) LOCK FLUSH 100 UNIT/ML IV SOLN
500.0000 [IU] | Freq: Once | INTRAVENOUS | Status: AC | PRN
  Administered 2015-12-06: 500 [IU]
  Filled 2015-12-06: qty 5

## 2015-12-06 MED ORDER — SODIUM CHLORIDE 0.9 % IV SOLN
6.0000 mg/kg | Freq: Once | INTRAVENOUS | Status: AC
  Administered 2015-12-06: 756 mg via INTRAVENOUS
  Filled 2015-12-06: qty 36

## 2015-12-06 MED ORDER — SODIUM CHLORIDE 0.9 % IV SOLN
Freq: Once | INTRAVENOUS | Status: AC
  Administered 2015-12-06: 12:00:00 via INTRAVENOUS

## 2015-12-06 MED ORDER — SODIUM CHLORIDE 0.9% FLUSH: 10.0000 mL | INTRAVENOUS | Status: DC | PRN

## 2015-12-06 MED ORDER — ACETAMINOPHEN 325 MG PO TABS
650.0000 mg | ORAL_TABLET | Freq: Once | ORAL | Status: AC
  Administered 2015-12-06: 650 mg via ORAL

## 2015-12-06 NOTE — Patient Instructions (Signed)
[facility] Discharge Instructions for Patients Receiving Chemotherapy   Beginning January 23rd 2017 lab work for the Cancer Center will be done in the  Main lab at [place] on 1st floor. If you have a lab appointment with the Cancer Center please come in thru the  Main Entrance and check in at the main information desk   Today you received the following chemotherapy agents Herceptin. If you develop nausea and vomiting, or diarrhea that is not controlled by your medication, call the clinic.  The clinic phone number is [phone]. Office hours are Monday-Friday 8:30am-5:00pm.  BELOW ARE SYMPTOMS THAT SHOULD BE REPORTED IMMEDIATELY:  *FEVER GREATER THAN 101.0 F  *CHILLS WITH OR WITHOUT FEVER  NAUSEA AND VOMITING THAT IS NOT CONTROLLED WITH YOUR NAUSEA MEDICATION  *UNUSUAL SHORTNESS OF BREATH  *UNUSUAL BRUISING OR BLEEDING  TENDERNESS IN MOUTH AND THROAT WITH OR WITHOUT PRESENCE OF ULCERS  *URINARY PROBLEMS  *BOWEL PROBLEMS  UNUSUAL RASH Items with * indicate a potential emergency and should be followed up as soon as possible. If you have an emergency after office hours please contact your primary care physician or go to the nearest emergency department.  Please call the clinic during office hours if you have any questions or concerns.   You may also contact the Patient Navigator at [phone] should you have any questions or need assistance in obtaining follow up care.      Resources For Cancer Patients and their Caregivers ? American Cancer Society: Can assist with transportation, wigs, general needs, runs Look Good Feel Better.        1-888-227-6333 ? Cancer Care: Provides financial assistance, online support groups, medication/co-pay assistance.  1-800-813-HOPE (4673) ? [facility] Assists [place] cancer patients and their families through emotional , educational and financial support.   336-427-4357 ? [place] DSS Where to apply for food stamps, Medicaid and utility assistance. 336-342-1394 ? RCATS: Transportation to medical appointments. 336-347-2287 ? Social Security Administration: May apply for disability if have a Stage IV cancer. 336-342-7796 1-800-772-1213 ? [place] Aging, Disability and Transit Services: Assists with nutrition, care and transit needs. 336-349-2343         

## 2015-12-06 NOTE — Progress Notes (Signed)
Tolerated infusion w/o adverse reaction.  Alert, in no distress.  VSS.  Discharged via wheelchair in c/o family for transport home.  

## 2015-12-27 ENCOUNTER — Encounter (HOSPITAL_BASED_OUTPATIENT_CLINIC_OR_DEPARTMENT_OTHER): Payer: Medicare Other | Admitting: Oncology

## 2015-12-27 ENCOUNTER — Encounter (HOSPITAL_COMMUNITY): Payer: Self-pay | Admitting: Oncology

## 2015-12-27 ENCOUNTER — Encounter (HOSPITAL_BASED_OUTPATIENT_CLINIC_OR_DEPARTMENT_OTHER): Payer: Medicare Other

## 2015-12-27 VITALS — BP 142/55 | HR 65 | Temp 97.6°F | Resp 18 | Wt 274.2 lb

## 2015-12-27 DIAGNOSIS — C773 Secondary and unspecified malignant neoplasm of axilla and upper limb lymph nodes: Secondary | ICD-10-CM

## 2015-12-27 DIAGNOSIS — C50412 Malignant neoplasm of upper-outer quadrant of left female breast: Secondary | ICD-10-CM | POA: Diagnosis present

## 2015-12-27 DIAGNOSIS — Z170 Estrogen receptor positive status [ER+]: Secondary | ICD-10-CM

## 2015-12-27 DIAGNOSIS — L738 Other specified follicular disorders: Secondary | ICD-10-CM | POA: Diagnosis present

## 2015-12-27 DIAGNOSIS — Z5112 Encounter for antineoplastic immunotherapy: Secondary | ICD-10-CM | POA: Diagnosis not present

## 2015-12-27 DIAGNOSIS — D649 Anemia, unspecified: Secondary | ICD-10-CM | POA: Diagnosis present

## 2015-12-27 DIAGNOSIS — D509 Iron deficiency anemia, unspecified: Secondary | ICD-10-CM | POA: Diagnosis present

## 2015-12-27 LAB — CBC WITH DIFFERENTIAL/PLATELET
Basophils Absolute: 0 10*3/uL (ref 0.0–0.1)
Basophils Relative: 0 %
EOS ABS: 0.2 10*3/uL (ref 0.0–0.7)
Eosinophils Relative: 4 %
HEMATOCRIT: 35.7 % — AB (ref 36.0–46.0)
HEMOGLOBIN: 11 g/dL — AB (ref 12.0–15.0)
LYMPHS ABS: 1.4 10*3/uL (ref 0.7–4.0)
Lymphocytes Relative: 33 %
MCH: 27.1 pg (ref 26.0–34.0)
MCHC: 30.8 g/dL (ref 30.0–36.0)
MCV: 87.9 fL (ref 78.0–100.0)
MONO ABS: 0.3 10*3/uL (ref 0.1–1.0)
MONOS PCT: 6 %
NEUTROS ABS: 2.4 10*3/uL (ref 1.7–7.7)
NEUTROS PCT: 57 %
Platelets: 186 10*3/uL (ref 150–400)
RBC: 4.06 MIL/uL (ref 3.87–5.11)
RDW: 13.8 % (ref 11.5–15.5)
WBC: 4.3 10*3/uL (ref 4.0–10.5)

## 2015-12-27 LAB — COMPREHENSIVE METABOLIC PANEL
ALBUMIN: 3.5 g/dL (ref 3.5–5.0)
ALT: 32 U/L (ref 14–54)
ANION GAP: 4 — AB (ref 5–15)
AST: 24 U/L (ref 15–41)
Alkaline Phosphatase: 107 U/L (ref 38–126)
BUN: 15 mg/dL (ref 6–20)
CHLORIDE: 100 mmol/L — AB (ref 101–111)
CO2: 31 mmol/L (ref 22–32)
Calcium: 8.8 mg/dL — ABNORMAL LOW (ref 8.9–10.3)
Creatinine, Ser: 0.68 mg/dL (ref 0.44–1.00)
GFR calc Af Amer: >60 mL/min (ref >60)
Glucose, Bld: 208 mg/dL — ABNORMAL HIGH (ref 65–99)
POTASSIUM: 3.6 mmol/L (ref 3.5–5.1)
Sodium: 135 mmol/L (ref 135–145)
Total Bilirubin: 0.5 mg/dL (ref 0.3–1.2)
Total Protein: 7.3 g/dL (ref 6.5–8.1)

## 2015-12-27 MED ORDER — SODIUM CHLORIDE 0.9% FLUSH
10.0000 mL | INTRAVENOUS | Status: DC | PRN
  Administered 2015-12-27: 10 mL
  Filled 2015-12-27: qty 10

## 2015-12-27 MED ORDER — HEPARIN SOD (PORK) LOCK FLUSH 100 UNIT/ML IV SOLN
500.0000 [IU] | Freq: Once | INTRAVENOUS | Status: AC | PRN
  Administered 2015-12-27: 500 [IU]
  Filled 2015-12-27: qty 5

## 2015-12-27 MED ORDER — DIPHENHYDRAMINE HCL 25 MG PO CAPS
50.0000 mg | ORAL_CAPSULE | Freq: Once | ORAL | Status: AC
  Administered 2015-12-27: 50 mg via ORAL
  Filled 2015-12-27: qty 2

## 2015-12-27 MED ORDER — ACETAMINOPHEN 325 MG PO TABS
650.0000 mg | ORAL_TABLET | Freq: Once | ORAL | Status: AC
  Administered 2015-12-27: 650 mg via ORAL
  Filled 2015-12-27: qty 2

## 2015-12-27 MED ORDER — SODIUM CHLORIDE 0.9 % IV SOLN
Freq: Once | INTRAVENOUS | Status: AC
Start: 2015-12-27 — End: 2015-12-27
  Administered 2015-12-27: 11:00:00 via INTRAVENOUS

## 2015-12-27 MED ORDER — TRASTUZUMAB CHEMO 150 MG IV SOLR
750.0000 mg | Freq: Once | INTRAVENOUS | Status: AC
  Administered 2015-12-27: 750 mg via INTRAVENOUS
  Filled 2015-12-27: qty 28.57

## 2015-12-27 NOTE — Patient Instructions (Signed)
Kristy Harris at [facility] Discharge Instructions  RECOMMENDATIONS MADE BY THE CONSULTANT AND ANY TEST RESULTS WILL BE SENT TO YOUR REFERRING PHYSICIAN.  You were seen by Gershon Mussel today. Treatment in 3 weeks Follow up and treatment in 6 weeks Continue Arimidex daily  Thank you for choosing [facility] to provide your oncology and hematology care.  To afford each patient quality time with our provider, please arrive at least 15 minutes before your scheduled appointment time.   Beginning January 23rd 2017 lab work for the [facility] will be done in the  Main lab at [facility] on 1st floor. If you have a lab appointment with the [facility] please come in thru the  Main Entrance and check in at the main information desk  You need to re-schedule your appointment should you arrive 10 or more minutes late.  We strive to give you quality time with our providers, and arriving late affects you and other patients whose appointments are after yours.  Also, if you no show three or more times for appointments you may be dismissed from the clinic at the providers discretion.     Again, thank you for choosing [facility].  Our hope is that these requests will decrease the amount of time that you wait before being seen by our physicians.       _____________________________________________________________  Should you have questions after your visit to [facility], please contact our office at [phone] between the hours of 8:30 a.m. and 4:30 p.m.  Voicemails left after 4:30 p.m. will not be returned until the following business day.  For prescription refill requests, have your pharmacy contact our office.         Resources For Cancer Patients and their Caregivers ? American Cancer Society: Can assist with transportation, wigs, general needs, runs Look Good Feel Better.        480 008 5602 ? Cancer  Care: Provides financial assistance, online support groups, medication/co-pay assistance.  1-800-813-HOPE 647-773-8730) ? [facility] Assists [place] cancer patients and their families through emotional , educational and financial support.  843 808 9017 ? [place] DSS Where to apply for food stamps, Medicaid and utility assistance. 971-288-9914 ? RCATS: Transportation to medical appointments. (639) 113-0720 ? Social Security Administration: May apply for disability if have a Stage IV cancer. 754-842-4127 870-034-2175 ? [employer], Disability and Transit Services: Assists with nutrition, care and transit needs. [place] Support Programs: @10RELATIVEDAYS @ > Cancer Support Group  2nd Tuesday of the month 1pm-2pm, Journey Room  > Creative Journey  3rd Tuesday of the month 1130am-1pm, Journey Room  > Look Good Feel Better  1st Wednesday of the month 10am-12 noon, Journey Room (Call [place] to register [phone])

## 2015-12-27 NOTE — Assessment & Plan Note (Addendum)
Stage IIA (T1N1c) invasive ductal carcinoma of left breast, ER+/PR+/HER2+, with 1/1 sentinel lymph node for metastatic disease.  Now on Hercpetin and Arimidex.  Oncology history is updated.  Pre-chemo labs today: CBC diff, CMET.   I personally reviewed and went over laboratory results with the patient.  The results are noted within this dictation.    I personally reviewed and went over radiographic studies with the patient.  The results are noted within this dictation.  Mammogram on 11/06/2015 was NEGATIVE.  I personally reviewed and went over radiographic studies with the patient.  The results are noted within this dictation.  2D echo on 12/04/2015 was stable without any LVEF changes.  Return in 3 weeks for treatment.  Return in 6 weeks for labs, treatment, and follow-up appointment.  After today's cycle, she has 2 more cycles of Herceptin to complete 52 weeks worth of therapy.

## 2015-12-27 NOTE — Progress Notes (Signed)
[name], MD [place] [address]  Malignant neoplasm of upper-outer quadrant of left breast in female, estrogen receptor positive ([place])  CURRENT THERAPY: Herceptin and Arimidex  INTERVAL HISTORY: Kristy Harris 70 y.o. 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    Breast cancer of upper-outer quadrant of left female breast ([place])   11/01/2014 Mammogram    Possible mass in the left breast upper outer quadrant measuring 13 mm suspicious for breast cancer confirmed through ultrasound a spiculated hypoechoic mass ill-defined, no enlarged lymph nodes      11/01/2014 Initial Diagnosis    Invasive ductal carcinoma, moderately differentiated, ER > 90%, PR> 90%, HER-2 -2+ by IHC, ratio 1.15, KI 67: 29%, T1 cN0 stage IA clinical stage      11/24/2014 Echocardiogram    Systolic function was normal. The estimated ejection fraction was in the range of 55% to 60%.       12/25/2014 Surgery    Left lumpectomy: IDC 1.7 cm, positive for LVI, with DCIS, 1/1 sentinel node positive deposit 1.9 cm with extracapsular extension, ER 90%, PR 90%, HER-2 positive ratio 2.4, Ki-67 29% T1 cN1 stage II a      02/01/2015 - 04/19/2015 Chemotherapy    Abraxane/Herceptin      03/20/2015 Echocardiogram    Systolic function wasnormal. The estimated ejection fraction was in the range of 60%to 65%.      05/08/2015 - 06/22/2015 Radiation Therapy    Dr. Lisbeth Renshaw       05/10/2015 -  Antibody Plan    Herceptin every 21 days to complete 52 weeks worth of therapy.      06/13/2015 Echocardiogram    2D echo- The estimated ejection fraction was in the range of 60% to 65%. Diastolic function is abnormal, indeterminate grade. Wallmotion was normal.      06/28/2015 Imaging    Bone density- BMD as determined from Femur Total Left is 0.994 g/cm2 with a T-Score of -0.1.  This patient is considered normal according to [facility]) criteria.       09/12/2015 Echocardiogram    The estimated ejection fraction was in the range of 60% to 65%. Wall motion was normal; there were no regional wall motion abnormalities. Doppler parameters are consistent with abnormal L ventricular relaxation (grade 1 diastolic dysfunction).      12/04/2015 Echocardiogram    Left ventricle: The cavity size was normal. Wall thickness was   increased increased in a pattern of mild to moderate LVH.   Systolic function was normal. The estimated ejection fraction was   in the range of 60% to 65%. Wall motion was normal; there were no   regional wall motion abnormalities. Features are consistent with   a pseudonormal left ventricular filling pattern, with concomitant   abnormal relaxation and increased filling pressure (grade 2   diastolic dysfunction). Doppler parameters are consistent with   high ventricular filling pressure.       She is doing well.  She is tolerating treatment without any issues.  Compliance with Arimidex is discussed.  She denies any AI-related side effects at this time.  2D echo was completed and is stable.  Review of Systems  Constitutional: Negative.  Negative for chills, fever and weight loss.  HENT: Negative.   Eyes: Negative.  Negative for blurred vision and double vision.  Respiratory: Negative.  Negative for cough, sputum production and shortness of breath.   Cardiovascular: Positive for leg swelling. Negative for chest pain.  Gastrointestinal: Negative.  Negative for blood in stool, constipation, diarrhea, nausea and vomiting.  Genitourinary: Negative.   Musculoskeletal: Negative.   Skin: Negative.   Neurological: Negative.  Negative for weakness and headaches.  Endo/Heme/Allergies: Negative.   Psychiatric/Behavioral: Negative.     Past Medical History:  Diagnosis Date  . Anemia   . Aortic stenosis    mild AS 11/24/14 echo  .  Arthritis   . Breast cancer ([place]) 11/01/14   left breast   . COPD (chronic obstructive pulmonary disease) ([place])   . Diabetes mellitus   . Diverticulosis april 2016  . Enlarged heart   . GERD (gastroesophageal reflux disease)   . H. pylori infection   . Heart murmur   . Hidradenitis suppurativa   . Hyperlipidemia   . Hypertension   . Morbid obesity ([place])   . Pneumonia years ago  . PONV (postoperative nausea and vomiting)   . Recurrent boils    of perineal and buttocks  . Shortness of breath dyspnea    with exertion  . Sleep apnea    no cpap used since weight loss  . Urinary frequency     Past Surgical History:  Procedure Laterality Date  . ABCESS DRAINAGE     on bottom  . ABDOMINAL HYSTERECTOMY  25 yrs ago   partial  . BLADDER SUSPENSION     x 2  . BREAST LUMPECTOMY WITH RADIOACTIVE SEED AND SENTINEL LYMPH NODE BIOPSY Left 12/25/2014   Procedure: RADIOACTIVE SEED GIUDED LEFT BREAST LUMPECTOMY, LEFT AXILLARY SENTINEL LYMPH NODE BIOPSY;  Surgeon: Alphonsa Overall, MD;  Location: [facility];  Service: General;  Laterality: Left;  . CARDIAC CATHETERIZATION  02/21/2011   Normal coronary arteries, normal EF  . EXCISION HYDRADENITIS LABIA N/A 12/08/2013   Procedure: EXCISION HIDRADENITIS PUBIC AREA;  Surgeon: Alphonsa Overall, MD;  Location: [facility];  Service: General;  Laterality: N/A;  . HYDRADENITIS EXCISION Left 12/08/2013   Procedure: EXCISION HIDRADENITIS AXILLA;  Surgeon: Alphonsa Overall, MD;  Location: [facility];  Service: General;  Laterality: Left;  . IRRIGATION AND DEBRIDEMENT ABSCESS Right 12/23/2012   Procedure: incision  AND DEBRIDEMENT right buttock infection ;  Surgeon: Shann Medal, MD;  Location: [facility];  Service: General;  Laterality: Right;  . PILONIDAL CYST EXCISION    . PORTACATH PLACEMENT N/A 01/12/2015   Procedure: INSERTION PORT-A-CATH;  Surgeon: Alphonsa Overall, MD;  Location: [facility];  Service: General;  Laterality: N/A;  . TONSILLECTOMY  yrs ago    Family History  Problem  Relation Age of Onset  . Heart attack Mother     had multiple health problems  . Lung cancer Father 81    smoker  . Prostate cancer Brother   . Lung cancer Brother     dx. 25s; smoker  . Throat cancer Brother     dx. 1s; smoker  . Diabetes Sister     has 3 living sisters with multiple health problems  . Hypertension Son   . Hypertension Daughter   . Breast cancer Sister 5  . Breast cancer Sister     dx. 33s  . Breast cancer Maternal Grandmother     dx. 71s  . Breast cancer Maternal Aunt     dx. older than 89  . Dementia Maternal Aunt   . Breast cancer Cousin     dx. 25s  . Hypertension Brother  Social History   Social History  . Marital status: Widowed    Spouse name: N/A  . Number of children: 5  . Years of education: N/A   Occupational History  . Textile work     Disabled   Social History Main Topics  . Smoking status: Former Smoker    Packs/day: 1.00    Years: 10.00    Types: Cigarettes    Quit date: 03/03/1994  . Smokeless tobacco: Never Used  . Alcohol use No  . Drug use: No  . Sexual activity: Not Asked   Other Topics Concern  . None   Social History Narrative   Lives with grandson.  Widowed.  Has 2 sons and 3 daughters     PHYSICAL EXAMINATION  ECOG PERFORMANCE STATUS: 2 - Symptomatic, <50% confined to bed  There were no vitals filed for this visit.  BP 151/53 P 77 R 18 T 97.6 F O2 96% on RA.  GENERAL:alert, no distress, comfortable, cooperative, obese, smiling and accompanied by daughter, in chemo-bed. SKIN: skin color, texture, turgor are normal, no rashes or significant lesions HEAD: Normocephalic, No masses, lesions, tenderness or abnormalities EYES: normal, EOMI, Conjunctiva are pink and non-injected EARS: External ears normal OROPHARYNX:lips, buccal mucosa, and tongue normal and mucous membranes are moist  NECK: supple, trachea midline LYMPH:  not examined BREAST:not examined LUNGS: clear to auscultation and  percussion HEART: regular rate & rhythm, no gallops, S1 normal, S2 normal and soft systolic ejection murmur 1/6. ABDOMEN:abdomen soft, non-tender, obese and normal bowel sounds BACK: Back symmetric, no curvature. EXTREMITIES:less then 2 second capillary refill, no skin discoloration, no cyanosis, minimal bilateral trace pitting edema in lower extremities. NEURO: alert & oriented x 3 with fluent speech, no focal motor/sensory deficits   LABORATORY DATA: CBC    Component Value Date/Time   WBC 4.4 11/15/2015 1109   RBC 3.67 (L) 11/15/2015 1109   HGB 10.2 (L) 11/15/2015 1109   HCT 32.3 (L) 11/15/2015 1109   PLT 193 11/15/2015 1109   MCV 88.0 11/15/2015 1109   MCH 27.8 11/15/2015 1109   MCHC 31.6 11/15/2015 1109   RDW 14.6 11/15/2015 1109   LYMPHSABS 1.2 11/15/2015 1109   MONOABS 0.3 11/15/2015 1109   EOSABS 0.2 11/15/2015 1109   BASOSABS 0.0 11/15/2015 1109      Chemistry      Component Value Date/Time   NA 139 11/15/2015 1109   K 3.5 11/15/2015 1109   CL 101 11/15/2015 1109   CO2 30 11/15/2015 1109   BUN 12 11/15/2015 1109   CREATININE 0.67 11/15/2015 1109      Component Value Date/Time   CALCIUM 8.8 (L) 11/15/2015 1109   ALKPHOS 107 11/15/2015 1109   AST 34 11/15/2015 1109   ALT 43 11/15/2015 1109   BILITOT 0.3 11/15/2015 1109        PENDING LABS:   RADIOGRAPHIC STUDIES:  No results found.   PATHOLOGY:    ASSESSMENT AND PLAN:  Breast cancer of upper-outer quadrant of left female breast ([place]) Stage IIA (T1N1c) invasive ductal carcinoma of left breast, ER+/PR+/HER2+, with 1/1 sentinel lymph node for metastatic disease.  Now on Hercpetin and Arimidex.  Oncology history is updated.  Pre-chemo labs today: CBC diff, CMET.   I personally reviewed and went over laboratory results with the patient.  The results are noted within this dictation.    I personally reviewed and went over radiographic studies with the patient.  The results are noted within this  dictation.  Mammogram on 11/06/2015 was NEGATIVE.  I personally reviewed and went over radiographic studies with the patient.  The results are noted within this dictation.  2D echo on 12/04/2015 was stable without any LVEF changes.  Return in 3 weeks for treatment.  Return in 6 weeks for labs, treatment, and follow-up appointment.  After today's cycle, she has 2 more cycles of Herceptin to complete 52 weeks worth of therapy.   ORDERS PLACED FOR THIS ENCOUNTER: No orders of the defined types were placed in this encounter.   MEDICATIONS PRESCRIBED THIS ENCOUNTER: Meds ordered this encounter  Medications  . HYDROcodone-acetaminophen (NORCO/VICODIN) 5-325 MG tablet    Sig: TAKE ONE TABLET BY MOUTH TWICE DAILY    THERAPY PLAN:  Continue with Herceptin to complete 52 weeks worth of therapy.  Compliance with Arimidex encouraged.  NCCN guidelines recommends the following surveillance for invasive breast cancer (2.2017):  A. History and Physical exam 1-4 times per year as clinically appropriate for 5 years, then annually.  B. Periodic screening for changes in family history and referral to genetics counseling as indicated  C. Educate, monitor, and refer to lymphedema management.  D. Mammography every 12 months  E. Routine imaging of reconstructed breast is not indicated.  F. In the absence of clinical signs and symptoms suggestive of recurrent disease, there is no indication for laboratory or imaging studies for metastases screening.  G. Women on Tamoxifen: annual gynecologic assessment every 12 months if uterus is present.  H. Women on aromatase inhibitor or who experience ovarian failure secondary to treatment should have monitoring of bone health with a bone mineral density determination at baseline and periodically thereafter.  I. Assess and encourage adherence to adjuvant endocrine therapy.  J. Evidence suggests that active lifestyle, healthy diet, limited alcohol intake, and achieving and  maintaining an ideal body weight (20-25 BMI) may lead to optimal breast cancer outcomes.   All questions were answered. The patient knows to call the clinic with any problems, questions or concerns. We can certainly see the patient much sooner if necessary.  Patient and plan discussed with Dr. Ancil Linsey and she is in agreement with the aforementioned.   This note is electronically signed by: Doy Mince 12/27/2015 11:39 AM

## 2016-01-02 ENCOUNTER — Other Ambulatory Visit (HOSPITAL_COMMUNITY): Payer: Self-pay | Admitting: Hematology & Oncology

## 2016-01-17 ENCOUNTER — Encounter (HOSPITAL_COMMUNITY): Payer: Medicare Other | Attending: Hematology & Oncology

## 2016-01-17 ENCOUNTER — Encounter (HOSPITAL_COMMUNITY): Payer: Self-pay

## 2016-01-17 VITALS — BP 152/74 | HR 70 | Temp 98.0°F | Resp 18 | Wt 273.8 lb

## 2016-01-17 DIAGNOSIS — L738 Other specified follicular disorders: Secondary | ICD-10-CM | POA: Insufficient documentation

## 2016-01-17 DIAGNOSIS — C50412 Malignant neoplasm of upper-outer quadrant of left female breast: Secondary | ICD-10-CM | POA: Diagnosis not present

## 2016-01-17 DIAGNOSIS — Z170 Estrogen receptor positive status [ER+]: Secondary | ICD-10-CM

## 2016-01-17 DIAGNOSIS — C773 Secondary and unspecified malignant neoplasm of axilla and upper limb lymph nodes: Secondary | ICD-10-CM

## 2016-01-17 DIAGNOSIS — D509 Iron deficiency anemia, unspecified: Secondary | ICD-10-CM | POA: Insufficient documentation

## 2016-01-17 DIAGNOSIS — Z5112 Encounter for antineoplastic immunotherapy: Secondary | ICD-10-CM | POA: Diagnosis not present

## 2016-01-17 DIAGNOSIS — D649 Anemia, unspecified: Secondary | ICD-10-CM | POA: Insufficient documentation

## 2016-01-17 MED ORDER — TRASTUZUMAB CHEMO 150 MG IV SOLR
750.0000 mg | Freq: Once | INTRAVENOUS | Status: AC
  Administered 2016-01-17: 750 mg via INTRAVENOUS
  Filled 2016-01-17: qty 7.14

## 2016-01-17 MED ORDER — SODIUM CHLORIDE 0.9 % IV SOLN
Freq: Once | INTRAVENOUS | Status: AC
  Administered 2016-01-17: 11:00:00 via INTRAVENOUS

## 2016-01-17 MED ORDER — SODIUM CHLORIDE 0.9% FLUSH: 10.0000 mL | INTRAVENOUS | Status: DC | PRN

## 2016-01-17 MED ORDER — DIPHENHYDRAMINE HCL 25 MG PO CAPS
50.0000 mg | ORAL_CAPSULE | Freq: Once | ORAL | Status: AC
  Administered 2016-01-17: 50 mg via ORAL

## 2016-01-17 MED ORDER — HEPARIN SOD (PORK) LOCK FLUSH 100 UNIT/ML IV SOLN
500.0000 [IU] | Freq: Once | INTRAVENOUS | Status: AC | PRN
  Administered 2016-01-17: 500 [IU]

## 2016-01-17 MED ORDER — ACETAMINOPHEN 325 MG PO TABS
650.0000 mg | ORAL_TABLET | Freq: Once | ORAL | Status: AC
  Administered 2016-01-17: 650 mg via ORAL

## 2016-01-17 NOTE — Progress Notes (Signed)
Tolerated infusion w/o adverse reaction.  Alert, in no distress.  VSS.  Discharged via wheelchair in c/o family.  

## 2016-01-17 NOTE — Patient Instructions (Signed)
[facility] Discharge Instructions for Patients Receiving Chemotherapy   Beginning January 23rd 2017 lab work for the Cancer Center will be done in the  Main lab at [facility] on 1st floor. If you have a lab appointment with the Cancer Center please come in thru the  Main Entrance and check in at the main information desk   Today you received the following chemotherapy agents Herceptin. If you develop nausea and vomiting, or diarrhea that is not controlled by your medication, call the clinic.  The clinic phone number is [phone]. Office hours are Monday-Friday 8:30am-5:00pm.  BELOW ARE SYMPTOMS THAT SHOULD BE REPORTED IMMEDIATELY:  *FEVER GREATER THAN 101.0 F  *CHILLS WITH OR WITHOUT FEVER  NAUSEA AND VOMITING THAT IS NOT CONTROLLED WITH YOUR NAUSEA MEDICATION  *UNUSUAL SHORTNESS OF BREATH  *UNUSUAL BRUISING OR BLEEDING  TENDERNESS IN MOUTH AND THROAT WITH OR WITHOUT PRESENCE OF ULCERS  *URINARY PROBLEMS  *BOWEL PROBLEMS  UNUSUAL RASH Items with * indicate a potential emergency and should be followed up as soon as possible. If you have an emergency after office hours please contact your primary care physician or go to the nearest emergency department.  Please call the clinic during office hours if you have any questions or concerns.   You may also contact the Patient Navigator at [phone] should you have any questions or need assistance in obtaining follow up care.      Resources For Cancer Patients and their Caregivers ? American Cancer Society: Can assist with transportation, wigs, general needs, runs Look Good Feel Better.        1-888-227-6333 ? Cancer Care: Provides financial assistance, online support groups, medication/co-pay assistance.  1-800-813-HOPE (4673) ? [facility] Assists [place] cancer patients and their families through emotional , educational and financial support.   336-427-4357 ? [place] DSS Where to apply for food stamps, Medicaid and utility assistance. 336-342-1394 ? [facility]: Transportation to medical appointments. 336-347-2287 ? Social Security Administration: May apply for disability if have a Stage IV cancer. 336-342-7796 1-800-772-1213 ? [place] [facility]ging, Disability and Transit Services: Assists with nutrition, care and transit needs. 336-349-2343         

## 2016-02-07 ENCOUNTER — Encounter (HOSPITAL_COMMUNITY): Payer: Self-pay | Admitting: Hematology & Oncology

## 2016-02-07 ENCOUNTER — Encounter (HOSPITAL_BASED_OUTPATIENT_CLINIC_OR_DEPARTMENT_OTHER): Payer: Medicare Other

## 2016-02-07 ENCOUNTER — Encounter (HOSPITAL_COMMUNITY): Payer: Medicare Other | Attending: Hematology & Oncology | Admitting: Hematology & Oncology

## 2016-02-07 VITALS — BP 162/67 | HR 70 | Temp 98.5°F | Resp 18 | Wt 279.4 lb

## 2016-02-07 DIAGNOSIS — C50412 Malignant neoplasm of upper-outer quadrant of left female breast: Secondary | ICD-10-CM

## 2016-02-07 DIAGNOSIS — D509 Iron deficiency anemia, unspecified: Secondary | ICD-10-CM | POA: Diagnosis present

## 2016-02-07 DIAGNOSIS — Z5112 Encounter for antineoplastic immunotherapy: Secondary | ICD-10-CM

## 2016-02-07 DIAGNOSIS — Z170 Estrogen receptor positive status [ER+]: Principal | ICD-10-CM

## 2016-02-07 DIAGNOSIS — L738 Other specified follicular disorders: Secondary | ICD-10-CM | POA: Insufficient documentation

## 2016-02-07 DIAGNOSIS — D649 Anemia, unspecified: Secondary | ICD-10-CM | POA: Diagnosis present

## 2016-02-07 DIAGNOSIS — C773 Secondary and unspecified malignant neoplasm of axilla and upper limb lymph nodes: Secondary | ICD-10-CM

## 2016-02-07 DIAGNOSIS — D6481 Anemia due to antineoplastic chemotherapy: Secondary | ICD-10-CM

## 2016-02-07 DIAGNOSIS — C50919 Malignant neoplasm of unspecified site of unspecified female breast: Secondary | ICD-10-CM

## 2016-02-07 DIAGNOSIS — E119 Type 2 diabetes mellitus without complications: Secondary | ICD-10-CM | POA: Diagnosis not present

## 2016-02-07 DIAGNOSIS — Z79811 Long term (current) use of aromatase inhibitors: Secondary | ICD-10-CM

## 2016-02-07 DIAGNOSIS — E559 Vitamin D deficiency, unspecified: Secondary | ICD-10-CM

## 2016-02-07 LAB — CBC WITH DIFFERENTIAL/PLATELET
Basophils Absolute: 0 10*3/uL (ref 0.0–0.1)
Basophils Relative: 0 %
EOS ABS: 0.2 10*3/uL (ref 0.0–0.7)
EOS PCT: 5 %
HCT: 35.3 % — ABNORMAL LOW (ref 36.0–46.0)
HEMOGLOBIN: 11.1 g/dL — AB (ref 12.0–15.0)
LYMPHS ABS: 1.8 10*3/uL (ref 0.7–4.0)
LYMPHS PCT: 37 %
MCH: 27.7 pg (ref 26.0–34.0)
MCHC: 31.4 g/dL (ref 30.0–36.0)
MCV: 88 fL (ref 78.0–100.0)
MONOS PCT: 5 %
Monocytes Absolute: 0.2 10*3/uL (ref 0.1–1.0)
NEUTROS PCT: 53 %
Neutro Abs: 2.7 10*3/uL (ref 1.7–7.7)
Platelets: 189 10*3/uL (ref 150–400)
RBC: 4.01 MIL/uL (ref 3.87–5.11)
RDW: 14.6 % (ref 11.5–15.5)
WBC: 5 10*3/uL (ref 4.0–10.5)

## 2016-02-07 LAB — COMPREHENSIVE METABOLIC PANEL
ALK PHOS: 112 U/L (ref 38–126)
ALT: 28 U/L (ref 14–54)
AST: 25 U/L (ref 15–41)
Albumin: 3.6 g/dL (ref 3.5–5.0)
Anion gap: 7 (ref 5–15)
BUN: 14 mg/dL (ref 6–20)
CALCIUM: 9 mg/dL (ref 8.9–10.3)
CO2: 29 mmol/L (ref 22–32)
CREATININE: 0.69 mg/dL (ref 0.44–1.00)
Chloride: 102 mmol/L (ref 101–111)
Glucose, Bld: 137 mg/dL — ABNORMAL HIGH (ref 65–99)
Potassium: 3.7 mmol/L (ref 3.5–5.1)
SODIUM: 138 mmol/L (ref 135–145)
TOTAL PROTEIN: 7.7 g/dL (ref 6.5–8.1)
Total Bilirubin: 0.5 mg/dL (ref 0.3–1.2)

## 2016-02-07 MED ORDER — SODIUM CHLORIDE 0.9 % IV SOLN
Freq: Once | INTRAVENOUS | Status: AC
  Administered 2016-02-07: 10:00:00 via INTRAVENOUS

## 2016-02-07 MED ORDER — HEPARIN SOD (PORK) LOCK FLUSH 100 UNIT/ML IV SOLN
500.0000 [IU] | Freq: Once | INTRAVENOUS | Status: AC | PRN
  Administered 2016-02-07: 500 [IU]

## 2016-02-07 MED ORDER — TRASTUZUMAB CHEMO 150 MG IV SOLR
750.0000 mg | Freq: Once | INTRAVENOUS | Status: AC
  Administered 2016-02-07: 750 mg via INTRAVENOUS
  Filled 2016-02-07: qty 14.29

## 2016-02-07 MED ORDER — ACETAMINOPHEN 325 MG PO TABS
650.0000 mg | ORAL_TABLET | Freq: Once | ORAL | Status: AC
  Administered 2016-02-07: 650 mg via ORAL

## 2016-02-07 MED ORDER — DIPHENHYDRAMINE HCL 25 MG PO CAPS
ORAL_CAPSULE | ORAL | Status: AC
  Filled 2016-02-07: qty 2

## 2016-02-07 MED ORDER — DIPHENHYDRAMINE HCL 25 MG PO CAPS
50.0000 mg | ORAL_CAPSULE | Freq: Once | ORAL | Status: AC
  Administered 2016-02-07: 50 mg via ORAL

## 2016-02-07 MED ORDER — HEPARIN SOD (PORK) LOCK FLUSH 100 UNIT/ML IV SOLN
INTRAVENOUS | Status: AC
  Filled 2016-02-07: qty 5

## 2016-02-07 MED ORDER — ACETAMINOPHEN 325 MG PO TABS
ORAL_TABLET | ORAL | Status: AC
  Filled 2016-02-07: qty 2

## 2016-02-07 NOTE — Progress Notes (Signed)
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), one positive sentinel node, intranodal tumor deposit is 1.9cm with extracapsular extension L upper outer quadrant +LVI    Breast cancer of upper-outer quadrant of left female breast ([place])   11/01/2014 Mammogram    Possible mass in the left breast upper outer quadrant measuring 13 mm suspicious for breast cancer confirmed through ultrasound a spiculated hypoechoic mass ill-defined, no enlarged lymph nodes      11/01/2014 Initial Diagnosis    Invasive ductal carcinoma, moderately differentiated, ER > 90%, PR> 90%, HER-2 -2+ by IHC, ratio 1.15, KI 67: 29%, T1 cN0 stage IA clinical stage      11/24/2014 Echocardiogram    Systolic function was normal. The estimated ejection fraction was in the range of 55% to 60%.       12/25/2014 Surgery    Left lumpectomy: IDC 1.7 cm, positive for LVI, with DCIS, 1/1 sentinel node positive deposit 1.9 cm with extracapsular extension, ER 90%, PR 90%, HER-2 positive ratio 2.4, Ki-67 29% T1 cN1 stage II a      02/01/2015 - 04/19/2015 Chemotherapy    Abraxane/Herceptin      03/20/2015 Echocardiogram    Systolic function wasnormal. The estimated ejection fraction was in the range of 60%to 65%.      05/08/2015 - 06/22/2015 Radiation Therapy    Dr. Lisbeth Renshaw       05/10/2015 -  Antibody Plan    Herceptin every 21 days to complete 52 weeks worth of therapy.      06/13/2015  Echocardiogram    2D echo- The estimated ejection fraction was in the range of 60% to 65%. Diastolic function is abnormal, indeterminate grade. Wallmotion was normal.      06/28/2015 Imaging    Bone density- BMD as determined from Femur Total Left is 0.994 g/cm2 with a T-Score of -0.1. This patient is considered normal according to [facility]) criteria.       09/12/2015 Echocardiogram    The estimated ejection fraction was in the range of 60% to 65%. Wall motion was normal; there were no regional wall motion abnormalities. Doppler parameters are consistent with abnormal L ventricular relaxation (grade 1 diastolic dysfunction).      12/04/2015 Echocardiogram    Left ventricle: The cavity size was normal. Wall thickness was   increased increased in a pattern of mild to moderate LVH.   Systolic function was normal. The estimated ejection fraction was   in the range of 60% to 65%. Wall motion was normal; there were no   regional wall motion abnormalities. Features are consistent with   a pseudonormal left ventricular filling pattern, with concomitant   abnormal relaxation and increased filling pressure (grade 2   diastolic dysfunction). Doppler parameters are consistent with   high ventricular filling pressure.        HISTORY OF PRESENTING ILLNESS:  Kristy Harris 70 y.o. female is here for follow-up of her ER+, PR+ HER 2 neu + breast cancer.  Patient is accompanied by her  family and receiving her last cycle of herceptin today. She will be scheduling port removal soon. She notes that she feels great. She is obviously excited to be done with herceptin. She takes her AI daily, calcium and vitamin D.   She had a mammogram on 11/06/15.  She has no complaints, aside from nocturia. 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  She has been trying to increase her activity. Appetite is excellent.   MEDICAL HISTORY:  Past Medical History:    Diagnosis Date  . Anemia   . Aortic stenosis    mild AS 11/24/14 echo  . Arthritis   . Breast cancer ([place]) 11/01/14   left breast   . COPD (chronic obstructive pulmonary disease) ([place])   . Diabetes mellitus   . Diverticulosis april 2016  . Enlarged heart   . GERD (gastroesophageal reflux disease)   . H. pylori infection   . Heart murmur   . Hidradenitis suppurativa   . Hyperlipidemia   . Hypertension   . Morbid obesity ([place])   . Pneumonia years ago  . PONV (postoperative nausea and vomiting)   . Recurrent boils    of perineal and buttocks  . Shortness of breath dyspnea    with exertion  . Sleep apnea    no cpap used since weight loss  . Urinary frequency     SURGICAL HISTORY: Past Surgical History:  Procedure Laterality Date  . ABCESS DRAINAGE     on bottom  . ABDOMINAL HYSTERECTOMY  25 yrs ago   partial  . BLADDER SUSPENSION     x 2  . BREAST LUMPECTOMY WITH RADIOACTIVE SEED AND SENTINEL LYMPH NODE BIOPSY Left 12/25/2014   Procedure: RADIOACTIVE SEED GIUDED LEFT BREAST LUMPECTOMY, LEFT AXILLARY SENTINEL LYMPH NODE BIOPSY;  Surgeon: Alphonsa Overall, MD;  Location: [place];  Service: General;  Laterality: Left;  . CARDIAC CATHETERIZATION  02/21/2011   Normal coronary arteries, normal EF  . EXCISION HYDRADENITIS LABIA N/A 12/08/2013   Procedure: EXCISION HIDRADENITIS PUBIC AREA;  Surgeon: Alphonsa Overall, MD;  Location: [facility];  Service: General;  Laterality: N/A;  . HYDRADENITIS EXCISION Left 12/08/2013   Procedure: EXCISION HIDRADENITIS AXILLA;  Surgeon: Alphonsa Overall, MD;  Location: [facility];  Service: General;  Laterality: Left;  . IRRIGATION AND DEBRIDEMENT ABSCESS Right 12/23/2012   Procedure: incision  AND DEBRIDEMENT right buttock infection ;  Surgeon: Shann Medal, MD;  Location: [facility];  Service: General;  Laterality: Right;  . PILONIDAL CYST EXCISION    . PORTACATH PLACEMENT N/A 01/12/2015   Procedure: INSERTION PORT-A-CATH;  Surgeon: Alphonsa Overall, MD;  Location: [facility];  Service: General;  Laterality: N/A;  . TONSILLECTOMY  yrs ago    SOCIAL HISTORY: Social History   Social History  . Marital status: Widowed    Spouse name: N/A  . Number of children: 5  . Years of education: N/A   Occupational History  . Textile work     Disabled   Social History Main Topics  . Smoking status: Former Smoker    Packs/day: 1.00    Years: 10.00    Types: Cigarettes    Quit date: 03/03/1994  . Smokeless tobacco: Never Used  . Alcohol use No  . Drug use: No  . Sexual activity: Not on file   Other Topics Concern  . Not on file   Social History Narrative   Lives with grandson.  Widowed.  Has 2 sons and 3 daughters  Widowed 14 years 5 children, oldest is 80 yo 8 grandchildren 1 great grandchild Worked in a factory She used to enjoy fishing and play softball. Ex smoker, quit 20 years ago. ETOH, none  FAMILY HISTORY: Family History  Problem Relation Age of Onset  . Heart attack Mother     had multiple health problems  . Lung cancer Father 50    smoker  . Prostate cancer Brother   . Lung cancer Brother     dx. 61s; smoker  . Throat cancer Brother     dx. 4s; smoker  . Diabetes Sister     has 3 living sisters with multiple health problems  . Hypertension Son   . Hypertension Daughter   . Breast cancer Sister 51  . Breast cancer Sister     dx. 31s  . Breast cancer Maternal Grandmother     dx. 44s  . Breast cancer Maternal Aunt     dx. older than 57  . Dementia Maternal Aunt   . Breast cancer Cousin     dx. 53s  . Hypertension Brother    indicated that her mother is deceased. She indicated that her father is deceased. She indicated that two of her four sisters are alive. She indicated that three of her four brothers are deceased. She indicated that her maternal grandmother is deceased. She indicated that her maternal grandfather is deceased. She indicated that her paternal grandmother is deceased. She indicated that her paternal  grandfather is deceased. She indicated that her daughter is alive. She indicated that only one of her two sons is alive. She indicated that both of her maternal aunts are deceased. She indicated that her maternal uncle is deceased. She indicated that her paternal aunt is deceased. She indicated that her cousin is alive.   Mother died at 69 of a heart attack, she had a large heart and a hole in her heart. Non-smoker Father died at 60 of lung cancer, he was a smoker. 9 siblings, 4 living. One sister with breast cancer bilaterally, she is still living Another sister with breast cancer  ALLERGIES:  is allergic to lisinopril and penicillins.  MEDICATIONS:  Current Outpatient Prescriptions  Medication Sig Dispense Refill  . acetaminophen (TYLENOL) 500 MG tablet Take 1,000 mg by mouth every 6 (six) hours as needed for mild pain or headache.    . albuterol (PROVENTIL HFA;VENTOLIN HFA) 108 (90 BASE) MCG/ACT inhaler Inhale 2 puffs into the lungs every 4 (four) hours as needed for shortness of breath.    . anastrozole (ARIMIDEX) 1 MG tablet Take 1 tablet (1 mg total) by mouth daily. 90 tablet 1  . atorvastatin (LIPITOR) 20 MG tablet Take 20 mg by mouth every morning.     Marland Kitchen atorvastatin (LIPITOR) 20 MG tablet TAKE ONE (1) TABLET EACH DAY    . bismuth subsalicylate (PEPTO BISMOL) 262 MG chewable tablet Chew 2 tablets (524 mg total) by mouth 2 (two) times daily. (Patient taking differently: Chew 524 mg by mouth 2 (two) times daily as needed for indigestion (acid reflux). ) 22 tablet 0  . Cholecalciferol (VITAMIN D-3) 5000 UNITS TABS Take 5,000 Units by mouth every morning. Reported on 07/12/2015    . Cholecalciferol (VITAMIN D3) 5000 units TABS Take by mouth.    . ergocalciferol (VITAMIN D2) 50000 units capsule Take 1 capsule (50,000 Units total) by mouth once a week. 12 capsule 0  . ferrous sulfate 325 (65 FE) MG tablet Take 1 tablet (325 mg total) by  mouth every morning. (Patient taking differently: Take  325 mg by mouth 3 (three) times daily with meals. ) 30 tablet 0  . furosemide (LASIX) 20 MG tablet TAKE ONE TABLET BY MOUTH ONCE DAILY.    Marland Kitchen HYDROcodone-acetaminophen (NORCO) 7.5-325 MG tablet     . HYDROcodone-acetaminophen (NORCO/VICODIN) 5-325 MG per tablet Take 1 tablet by mouth 2 (two) times daily as needed for moderate pain.     Marland Kitchen HYDROcodone-acetaminophen (NORCO/VICODIN) 5-325 MG tablet TAKE ONE TABLET BY MOUTH TWICE DAILY    . HYDROcodone-acetaminophen (NORCO/VICODIN) 5-325 MG tablet TAKE ONE TABLET BY MOUTH TWICE DAILY    . ketoconazole (NIZORAL) 2 % cream Reported on 07/12/2015  0  . KLOR-CON M20 20 MEQ tablet TAKE 1 TABLET (20 MEQ TOTAL) BY MOUTH DAILY. 30 tablet 2  . lidocaine-prilocaine (EMLA) cream Apply topically as needed. 30 g 2  . losartan (COZAAR) 100 MG tablet Take 100 mg by mouth daily.    . metFORMIN (GLUCOPHAGE-XR) 500 MG 24 hr tablet Take 500 mg by mouth 2 (two) times daily.     . metoprolol (TOPROL-XL) 100 MG 24 hr tablet Take 100 mg by mouth every morning.     . metoprolol succinate (TOPROL-XL) 100 MG 24 hr tablet TAKE ONE (1) TABLET EACH DAY    . ondansetron (ZOFRAN) 8 MG tablet Take 1 tablet (8 mg total) by mouth every 8 (eight) hours as needed for nausea or vomiting. 30 tablet 3  . pantoprazole (PROTONIX) 40 MG tablet TAKE 1 TABLET (40 MG TOTAL) BY MOUTH DAILY. 90 tablet 0  . pantoprazole (PROTONIX) 40 MG tablet TAKE ONE (1) TABLET EACH DAY    . polyethylene glycol (MIRALAX / GLYCOLAX) packet Take 17 g by mouth daily as needed for mild constipation. 14 each 0  . prochlorperazine (COMPAZINE) 10 MG tablet Take 1 tablet (10 mg total) by mouth every 6 (six) hours as needed for nausea or vomiting. 30 tablet 2  . sodium chloride irrigation 0.9 % irrigation     . Trastuzumab (HERCEPTIN IV) Inject into the vein. To be given weekly    . verapamil (CALAN-SR) 120 MG CR tablet Take 120 mg by mouth daily.    . verapamil (CALAN-SR) 120 MG CR tablet TAKE ONE (1) TABLET EACH DAY      No current facility-administered medications for this visit.     Review of Systems  Constitutional: Positive for malaise/fatigue. Negative for chills, fever and weight loss.  HENT: Negative.  Negative for congestion, hearing loss, nosebleeds, sore throat and tinnitus.   Eyes: Negative.  Negative for blurred vision, double vision, pain and discharge.  Respiratory: Negative.  Negative for cough, hemoptysis, sputum production, shortness of breath and wheezing.   Cardiovascular: Negative.  Negative for chest pain, palpitations, claudication, leg swelling and PND.  Gastrointestinal: Negative for abdominal pain, blood in stool, constipation, diarrhea, heartburn, melena, nausea and vomiting.  Genitourinary: Positive for frequency. Negative for dysuria, hematuria and urgency.  Musculoskeletal: Negative for falls and myalgias.  Skin: Negative.  Negative for itching and rash.  Neurological: Positive for tingling. Negative for dizziness, tremors, sensory change, speech change, focal weakness, seizures, loss of consciousness, weakness and headaches.  Endo/Heme/Allergies: Negative.  Does not bruise/bleed easily.  Psychiatric/Behavioral: Negative.  Negative for depression, memory loss, substance abuse and suicidal ideas. The patient is not nervous/anxious and does not have insomnia.   All other systems reviewed and are negative.  14 point ROS was done and is otherwise as detailed above or in HPI  PHYSICAL EXAMINATION: ECOG PERFORMANCE STATUS: 2 - Symptomatic, <50% confined to bed    Vitals with BMI 02/07/2016  Height   Weight 279 lbs 6 oz  BMI   Systolic 427  Diastolic 86  Pulse 93  Respirations 18   Physical Exam  Constitutional: She is oriented to person, place, and time and well-developed, well-nourished, and in no distress.  Hair regrowth noted  HENT:  Head: Normocephalic and atraumatic.  Nose: Nose normal.  Mouth/Throat: Oropharynx is clear and moist. No oropharyngeal exudate.   Eyes: Conjunctivae and EOM are normal. Pupils are equal, round, and reactive to light. Right eye exhibits no discharge. Left eye exhibits no discharge. No scleral icterus.  Neck: Normal range of motion. Neck supple. No tracheal deviation present. No thyromegaly present.  Cardiovascular: Normal rate and regular rhythm.  Exam reveals no gallop and no friction rub.   No murmur heard. Pulmonary/Chest: Effort normal and breath sounds normal. She has no wheezes. She has no rales.  Abdominal: Soft. Bowel sounds are normal. She exhibits no distension and no mass. There is no tenderness. There is no rebound and no guarding.  Obese  Musculoskeletal: Normal range of motion. She exhibits no edema.  Lymphadenopathy:    She has no cervical adenopathy.  Neurological: She is alert and oriented to person, place, and time. No cranial nerve deficit. Coordination normal.  Only ambulatory with assistance and walker  Skin: Skin is warm and dry. No rash noted.  Psychiatric: Mood, memory, affect and judgment normal.  Nursing note and vitals reviewed.   LABORATORY DATA:  I have reviewed the data as listed Results for DELVA, DERDEN (MRN [id number]) as of 02/07/2016 11:16  Ref. Range 02/07/2016 09:48  Sodium Latest Ref Range: 135 - 145 mmol/L 138  Potassium Latest Ref Range: 3.5 - 5.1 mmol/L 3.7  Chloride Latest Ref Range: 101 - 111 mmol/L 102  CO2 Latest Ref Range: 22 - 32 mmol/L 29  BUN Latest Ref Range: 6 - 20 mg/dL 14  Creatinine Latest Ref Range: 0.44 - 1.00 mg/dL 0.69  Calcium Latest Ref Range: 8.9 - 10.3 mg/dL 9.0  EGFR (Non-African Amer.) Latest Ref Range: >60 mL/min >60  EGFR (African American) Latest Ref Range: >60 mL/min >60  Glucose Latest Ref Range: 65 - 99 mg/dL 137 (H)  Anion gap Latest Ref Range: 5 - 15  7  Alkaline Phosphatase Latest Ref Range: 38 - 126 U/L 112  Albumin Latest Ref Range: 3.5 - 5.0 g/dL 3.6  AST Latest Ref Range: 15 - 41 U/L 25  ALT Latest Ref Range: 14 - 54 U/L 28  Total  Protein Latest Ref Range: 6.5 - 8.1 g/dL 7.7  Total Bilirubin Latest Ref Range: 0.3 - 1.2 mg/dL 0.5  WBC Latest Ref Range: 4.0 - 10.5 K/uL 5.0  RBC Latest Ref Range: 3.87 - 5.11 MIL/uL 4.01  Hemoglobin Latest Ref Range: 12.0 - 15.0 g/dL 11.1 (L)  HCT Latest Ref Range: 36.0 - 46.0 % 35.3 (L)  MCV Latest Ref Range: 78.0 - 100.0 fL 88.0  MCH Latest Ref Range: 26.0 - 34.0 pg 27.7  MCHC Latest Ref Range: 30.0 - 36.0 g/dL 31.4  RDW Latest Ref Range: 11.5 - 15.5 % 14.6  Platelets Latest Ref Range: 150 - 400 K/uL 189  Neutrophils Latest Units: % 53  Lymphocytes Latest Units: % 37  Monocytes Relative Latest Units: % 5  Eosinophil Latest Units: % 5  Basophil Latest Units: % 0  NEUT# Latest Ref Range: 1.7 - 7.7  K/uL 2.7  Lymphocyte # Latest Ref Range: 0.7 - 4.0 K/uL 1.8  Monocyte # Latest Ref Range: 0.1 - 1.0 K/uL 0.2  Eosinophils Absolute Latest Ref Range: 0.0 - 0.7 K/uL 0.2  Basophils Absolute Latest Ref Range: 0.0 - 0.1 K/uL 0.0   RADIOLOGY: I have personally reviewed the radiological images as listed and agreed with the findings in the report.  Study Result   CLINICAL DATA:  History of malignant lumpectomy of the left breast in 2016. Follow-up evaluation.  EXAM: 2D DIGITAL DIAGNOSTIC BILATERAL MAMMOGRAM WITH CAD AND ADJUNCT TOMO  COMPARISON:  Previous examinations the most recent of which is dated 11/01/2014.  ACR Breast Density Category b: There are scattered areas of fibroglandular density.  FINDINGS: There are scarring changes located within the upper-outer quadrant of the left breast related to the patient's lumpectomy. There is skin thickening associated with the left breast related to the patient's radiation therapy. A Port-A-Cath overlies the superior right breast. There are no findings worrisome for recurrent tumor or developing malignancy within either breast.  Mammographic images were processed with CAD.  IMPRESSION: No findings worrisome for developing  malignancy.  RECOMMENDATION: Bilateral diagnostic mammography in 1 year.  I have discussed the findings and recommendations with the patient. Results were also provided in writing at the conclusion of the visit. If applicable, a reminder letter will be sent to the patient regarding the next appointment.  BI-RADS CATEGORY  1: Negative.   Electronically Signed   By: Altamese Cabal M.D.   On: 11/06/2015 10:16     Transthoracic Echocardiography  Patient:    Irine, Heminger MR #:       [id number] Study Date: 09/12/2015 Gender:     F Age:        72 Height:     161.3 cm Weight:     120.2 kg BSA:        2.39 m^2 Pt. 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 She has completed XRT. She is here today for ongoing Q3week herceptin.She has normal bone density. She was counseled to take calcium 1200 mg daily and vitamin D at least 1000 to 2000 IU daily. She is on arimidex and doing well. She is to continue this daily. She will complete herceptin today.   Mammogram is up to date.   Labs are reviewed with the patient. Results are noted above.  She would like to have her port out as it "bothers" her. This has been arranged.  I encouraged her to discuss nocturia with her PCP.   I will follow-up with her in 2 months. If she continues to  feel well, we will space appointments to every three months.   All questions were answered. The patient knows to call the clinic with any problems, questions or concerns.  This document serves as a record of services personally performed by Larene Beach  Penland, MD. It was created on her behalf by Elmyra Ricks, a trained medical scribe. The creation of this record is based on the scribe's personal observations and the provider's statements to them. This document has been checked and approved by the attending provider.  I have reviewed the above documentation for accuracy and completeness, and I agree with the above.  This note was electronically signed.  Molli Hazard, MD  02/07/2016 11:18 AM

## 2016-02-07 NOTE — Progress Notes (Signed)
Tolerated infusion w/o adverse reaction.  Alert, in no distress.  VSS.  Discharged via wheelchair in c/o family.  

## 2016-02-07 NOTE — Patient Instructions (Signed)
[place] at [facility] Discharge Instructions  RECOMMENDATIONS MADE BY THE CONSULTANT AND ANY TEST RESULTS WILL BE SENT TO YOUR REFERRING PHYSICIAN.  You saw Dr.Penland today. Follow up in 8 weeks. See Amy at checkout for appointments.  Thank you for choosing [place] at [facility] to provide your oncology and hematology care.  To afford each patient quality time with our provider, please arrive at least 15 minutes before your scheduled appointment time.   Beginning January 23rd 2017 lab work for the [facility] will be done in the  Main lab at [facility] on 1st floor. If you have a lab appointment with the [facility] please come in thru the  Main Entrance and check in at the main information desk  You need to re-schedule your appointment should you arrive 10 or more minutes late.  We strive to give you quality time with our providers, and arriving late affects you and other patients whose appointments are after yours.  Also, if you no show three or more times for appointments you may be dismissed from the clinic at the providers discretion.     Again, thank you for choosing [facility].  Our hope is that these requests will decrease the amount of time that you wait before being seen by our physicians.       _____________________________________________________________  Should you have questions after your visit to [facility], please contact our office at [phone] between the hours of 8:30 a.m. and 4:30 p.m.  Voicemails left after 4:30 p.m. will not be returned until the following business day.  For prescription refill requests, have your pharmacy contact our office.         Resources For Cancer Patients and their Caregivers ? American Cancer Society: Can assist with transportation, wigs, general needs, runs Look Good Feel Better.        251-626-5631 ? Cancer Care: Provides financial  assistance, online support groups, medication/co-pay assistance.  1-800-813-HOPE 346-648-4658) ? [place] Assists [place] cancer patients and their families through emotional , educational and financial support.  4107470509 ? [place] DSS Where to apply for food stamps, Medicaid and utility assistance. (831) 520-9530 ? [facility]: Transportation to medical appointments. 509-855-6328 ? Social Security Administration: May apply for disability if have a Stage IV cancer. (712) 440-9034 249 850 9474 ? [employer], Disability and Transit Services: Assists with nutrition, care and transit needs. [place] Support Programs: @10RELATIVEDAYS @ > Cancer Support Group  2nd Tuesday of the month 1pm-2pm, Journey Room  > Creative Journey  3rd Tuesday of the month 1130am-1pm, Journey Room  > Look Good Feel Better  1st Wednesday of the month 10am-12 noon, Journey Room (Call [place] to register [phone])

## 2016-02-07 NOTE — Patient Instructions (Signed)
[place] at [facility] Discharge Instructions  RECOMMENDATIONS MADE BY THE CONSULTANT AND ANY TEST RESULTS WILL BE SENT TO YOUR REFERRING PHYSICIAN.  Herceptin infusion today. Return as scheduled for office visit.   Thank you for choosing [place] at [facility] to provide your oncology and hematology care.  To afford each patient quality time with our provider, please arrive at least 15 minutes before your scheduled appointment time.   Beginning January 23rd 2017 lab work for the [facility] will be done in the  Main lab at [facility] on 1st floor. If you have a lab appointment with the [place] please come in thru the  Main Entrance and check in at the main information desk  You need to re-schedule your appointment should you arrive 10 or more minutes late.  We strive to give you quality time with our providers, and arriving late affects you and other patients whose appointments are after yours.  Also, if you no show three or more times for appointments you may be dismissed from the clinic at the providers discretion.     Again, thank you for choosing [facility].  Our hope is that these requests will decrease the amount of time that you wait before being seen by our physicians.       _____________________________________________________________  Should you have questions after your visit to [facility], please contact our office at [phone] between the hours of 8:30 a.m. and 4:30 p.m.  Voicemails left after 4:30 p.m. will not be returned until the following business day.  For prescription refill requests, have your pharmacy contact our office.         Resources For Cancer Patients and their Caregivers ? American Cancer Society: Can assist with transportation, wigs, general needs, runs Look Good Feel Better.        669-757-4301 ? Cancer Care: Provides financial assistance, online support  groups, medication/co-pay assistance.  1-800-813-HOPE (551)815-7861) ? [place] Assists [place] cancer patients and their families through emotional , educational and financial support.  5046239612 ? [place] DSS Where to apply for food stamps, Medicaid and utility assistance. 203-569-3622 ? RCATS: Transportation to medical appointments. 413 427 1294 ? Social Security Administration: May apply for disability if have a Stage IV cancer. 3098325913 [phone])[phone] ? [employer], Disability and Transit Services: Assists with nutrition, care and transit needs. [place] Support Programs: @10RELATIVEDAYS @ > Cancer Support Group  2nd Tuesday of the month 1pm-2pm, Journey Room  > Creative Journey  3rd Tuesday of the month 1130am-1pm, Journey Room  > Look Good Feel Better  1st Wednesday of the month 10am-12 noon, Journey Room (Call [place] to register [phone])

## 2016-02-28 ENCOUNTER — Other Ambulatory Visit: Payer: Self-pay | Admitting: Surgery

## 2016-03-14 ENCOUNTER — Encounter (HOSPITAL_BASED_OUTPATIENT_CLINIC_OR_DEPARTMENT_OTHER): Payer: Self-pay | Admitting: *Deleted

## 2016-03-14 NOTE — Pre-Procedure Instructions (Signed)
To come for BMET, EKG and anesthesia airway evaluation 

## 2016-03-18 ENCOUNTER — Encounter (HOSPITAL_BASED_OUTPATIENT_CLINIC_OR_DEPARTMENT_OTHER)
Admission: RE | Admit: 2016-03-18 | Discharge: 2016-03-18 | Disposition: A | Discharge status: Home / Self Care | Payer: Medicare Other | Source: Ambulatory Visit | Attending: Surgery | Admitting: Surgery

## 2016-03-18 DIAGNOSIS — E119 Type 2 diabetes mellitus without complications: Secondary | ICD-10-CM | POA: Diagnosis not present

## 2016-03-18 DIAGNOSIS — Z923 Personal history of irradiation: Secondary | ICD-10-CM | POA: Diagnosis not present

## 2016-03-18 DIAGNOSIS — Z01812 Encounter for preprocedural laboratory examination: Secondary | ICD-10-CM

## 2016-03-18 DIAGNOSIS — C50919 Malignant neoplasm of unspecified site of unspecified female breast: Secondary | ICD-10-CM

## 2016-03-18 DIAGNOSIS — Z01818 Encounter for other preprocedural examination: Secondary | ICD-10-CM | POA: Insufficient documentation

## 2016-03-18 DIAGNOSIS — R600 Localized edema: Secondary | ICD-10-CM | POA: Diagnosis not present

## 2016-03-18 DIAGNOSIS — Z853 Personal history of malignant neoplasm of breast: Secondary | ICD-10-CM | POA: Diagnosis not present

## 2016-03-18 DIAGNOSIS — I350 Nonrheumatic aortic (valve) stenosis: Secondary | ICD-10-CM | POA: Diagnosis not present

## 2016-03-18 DIAGNOSIS — R9431 Abnormal electrocardiogram [ECG] [EKG]: Secondary | ICD-10-CM

## 2016-03-18 DIAGNOSIS — K219 Gastro-esophageal reflux disease without esophagitis: Secondary | ICD-10-CM | POA: Diagnosis not present

## 2016-03-18 DIAGNOSIS — Z9221 Personal history of antineoplastic chemotherapy: Secondary | ICD-10-CM | POA: Diagnosis not present

## 2016-03-18 DIAGNOSIS — Z803 Family history of malignant neoplasm of breast: Secondary | ICD-10-CM | POA: Diagnosis not present

## 2016-03-18 DIAGNOSIS — Z79899 Other long term (current) drug therapy: Secondary | ICD-10-CM | POA: Diagnosis not present

## 2016-03-18 DIAGNOSIS — Z452 Encounter for adjustment and management of vascular access device: Secondary | ICD-10-CM | POA: Diagnosis present

## 2016-03-18 DIAGNOSIS — E7800 Pure hypercholesterolemia, unspecified: Secondary | ICD-10-CM | POA: Diagnosis not present

## 2016-03-18 DIAGNOSIS — Z6841 Body Mass Index (BMI) 40.0 and over, adult: Secondary | ICD-10-CM | POA: Diagnosis not present

## 2016-03-18 LAB — BASIC METABOLIC PANEL
Anion gap: 10 (ref 5–15)
BUN: 8 mg/dL (ref 6–20)
CHLORIDE: 99 mmol/L — AB (ref 101–111)
CO2: 27 mmol/L (ref 22–32)
CREATININE: 0.7 mg/dL (ref 0.44–1.00)
Calcium: 9.5 mg/dL (ref 8.9–10.3)
GFR calc Af Amer: >60 mL/min (ref >60)
GFR calc non Af Amer: >60 mL/min (ref >60)
Glucose, Bld: 151 mg/dL — ABNORMAL HIGH (ref 65–99)
Potassium: 4.9 mmol/L (ref 3.5–5.1)
Sodium: 136 mmol/L (ref 135–145)

## 2016-03-18 NOTE — Progress Notes (Signed)
EKG and BMP obtained. Dr. Nyoka Cowden reviewed EKG and evaluated pt, will proceed with surgery as scheduled.

## 2016-03-20 NOTE — H&P (Signed)
[facility]  Location: [facility] Surgery Patient #: [id number] DOB: 1945/12/04 Widowed / Language: [place] / Race: Black or African American Female   History of Present Illness   The patient is a 71 year old female who presents with breast cancer.   Her PCP is Dr. Alger Simons.   Oncology - [place] and Dr. Lisbeth Renshaw  She is accompanied by her daughter, Freda Munro.  [place] started her chemotx in Dec 2016.  She comes in a wheelchair. She is doing well. She is excited that the herceptin finishes 02/07/2016. She will call our office after the last treatment about planning removal of her power port. I have already filled out a posting sheet and left it with Ammie. She had mammograms on 11/06/2015 in [place] which were okay. Her next mammogram is in a year. So she will see me back in 6 months (except for the power port removal)  History of left breast cancer (12/2014): Ms. Neuberger comes with a new problem. She had an US guided left breast biopsy on 11/01/2014 through [facility]. The mass is in the UOQ of the left breast and measures 1.3 cm. The path report ([id number]) - well differentiated IDC. The tumor is ER - 90%, PR - 905, Ki67 - 29%, and HER2Neu - neg. She has a GM and 2 aunts on her mother's side who had breast cnacer. her sister just had breast cancer surgery by Dr. Ninfa Linden about 3 months ago. Her last mammogram was about 3 years ago. The hidradinitis interferred with her mammograms. She is not on hormone therapy. The patient underwent a right breast lumpectomy on 25 December 2014. [phone]) - 1.5 cm IDC, 1/1 node positive. Her repeat HER2Neu was **positive**. She finished her radiation ts to her left breast in April 2017 - she had the treatment at [facility].  She is now on Herceptin q 3 wks. She is going to get treatment until December 2017.   Other medical problems: 1. Abscess, both buttocks. Left axilla and left groin.  Hidradinitis. Excision of right buttocks infection - 12/23/2012 - D. Panayiotis [place] Excision of left axilla and left groins hidradinitis on 12/08/2013 by Dr. Keturah Barre. Georganna Maxson 2. Morbid obesity - BMI 46.8 3. Diabetes mellitus Not on insulin. 4. Using a cane to walk. Limited mobility. She came by wheelchair today (01/2016) 5. Aortic stenosis, mild Cardiac nuclear scan - 02/02/2014 - EF 58% [place] - 02/21/2011 Card clearance from Dr. Marigene Ehlers on 11/13/2012 - but she has not really seen cardiology since then. As best she can tell, she has no chronic follow up with cardiology. 6. Chronic lower extremity edema. 7. GI bleed secondary to diverticulosis - March 2016 She has been seen by Dr. Doretha Sou. 8. She takes vicodin jsut about daily 9. Right subclavian power port - 01/12/2015 - D. [employer]  Social History: Unmarried. Grandson lives with her at night. Daughter, Freda Munro, is with patient. She also has a and granddaughter, Tessa Lerner.  Other Problems Shann Medal, MD; 01/23/2016 9:11 AM) Arthritis  Bladder Problems  Diabetes Mellitus  Gastroesophageal Reflux Disease  Heart murmur  High blood pressure  Hypercholesterolemia   Allergies (Sonya Bynum, CMA; 01/23/2016 10:51 AM) Penicillins   Medication History (Sonya Bynum, CMA; 01/23/2016 10:51 AM) Atorvastatin Calcium ('20MG'$  Tablet, Oral) Active. Protonix ('40MG'$  Tablet DR, Oral) Active. Zofran ('4MG'$  Tablet, Oral every six hours) Active. Metoprolol Succinate ER ('100MG'$  Tablet ER 24HR, Oral) Active. MetFORMIN HCl ER (MOD) ('500MG'$  Tablet ER 24HR, Oral) Active. Losartan Potassium ('100MG'$  Tablet, Oral) Active. Ferrous Fumarate (  $'325MG'B$  Capsule, Oral) Active. Cholecalciferol (5000UNIT Tablet, Oral) Active. Pepto-Bismol ('262MG'$  Tablet, Oral) Active. Tylenol Allergy Sinus (500-2-'30MG'$  Capsule, Oral) Active. Hydrocodone-Acetaminophen (5-'325MG'$  Tablet, Oral) Active. Fluconazole ('150MG'$  Tablet, Oral)  Active. Furosemide ('20MG'$  Tablet, Oral) Active. Lisinopril ('40MG'$  Tablet, Oral) Active. Mupirocin (2% Ointment, External) Active. Omeprazole ('20MG'$  Capsule DR, Oral) Active. ProAir HFA (108 (90 Base)MCG/ACT Aerosol Soln, Inhalation) Active. Sodium Chloride (0.9% Solution, Irrigation) Active. Verapamil HCl ER ('120MG'$  Tablet ER, Oral) Active. GlipiZIDE ER ('10MG'$  Tablet ER 24HR, Oral) Active. Famotidine ('20MG'$  Tablet, Oral) Active. Medications Reconciled  Vitals (Sonya Bynum CMA; 01/23/2016 10:51 AM) 01/23/2016 10:50 AM Weight: 274 lb Height: 63in Body Surface Area: 2.21 m Body Mass Index: 48.54 kg/m  Temp.: 8F(Temporal)  Pulse: 75 (Regular)  BP: 128/80 (Sitting, Left Arm, Standard)   Physical Exam  General: Obese AA F alert. She needs some help getting on the exam table and has some trouble laying flat. HEENT: Normal. Pupils equal.  Neck: Supple. No mass. No thyroid mass.  Lymph Nodes: No supraclavicular, cervical or axillary nodes.  Lungs: Clear to auscultation and symmetric breath sounds. Heart: RRR. III/VI systolic murmur  Breast: Right - no mass. Power port in upper inner right breast. Left - Left breast incision looks good. I went through her breast incision to get her axillary SLNBx. Her axilla, for the hidradinitis, looks okay.  Extremities: Both arms okay.   Assessment & Plan  1. BREAST CANCER, STAGE 1, LEFT (C50.912)  Story: Biopsy 11/01/2014 at [facility] - path report [id number]) - well differentiated IDC. The tumor is ER - 90%, PR - 905, Ki67 - 29%, and HER2Neu - neg   right breast lumpectomy on 25 December 2014. [phone]) - 1.5 cm IDC, 1/1 node positive. Her repeat HER2Neu was **positive**.  Plan:   1) On herceptin until 07 February 2016   2) She will call Ammie (Dr. Pollie Friar nurse) about setting up a date for surgery to remove the power port   2) Follow up with me in 6 months.  2.  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 Quayshaun Hubbert  Abscess, both buttocks. Left axilla and left groin. Hidradinitis.  Excision of right buttocks infection - 12/23/2012 - D. Terryl Niziolek  Excision of left axilla and left groins hidradinitis on 12/08/2013 by Dr. Keturah Barre. Deborah Lazcano 3. Morbid obesity - BMI 46.8 4. Diabetes mellitus Not on insulin. 5. Using a cane to walk.  Limited mobility.  6. Aortic stenosis, mild  Cardiac nuclear scan - 02/02/2014 - EF 58%  Cards - has seen Dr. Marigene Ehlers  7. Chronic lower extremity edema.7. GI bleed secondary to diverticulosis - March 2016 She has been seen by Dr. Doretha Sou. 8. She takes vicodin just about daily 9. Right subclavian power port - 01/12/2015 - D. Levada Schilling, MD, [facility] Surgery Pager: [phone] Office phone:  [phone]

## 2016-03-21 ENCOUNTER — Encounter (HOSPITAL_BASED_OUTPATIENT_CLINIC_OR_DEPARTMENT_OTHER)
Admission: RE | Disposition: A | Discharge status: Home / Self Care | Payer: Self-pay | Source: Ambulatory Visit | Attending: Surgery

## 2016-03-21 ENCOUNTER — Encounter (HOSPITAL_BASED_OUTPATIENT_CLINIC_OR_DEPARTMENT_OTHER): Payer: Self-pay | Admitting: Anesthesiology

## 2016-03-21 ENCOUNTER — Encounter (HOSPITAL_BASED_OUTPATIENT_CLINIC_OR_DEPARTMENT_OTHER): Payer: Self-pay | Admitting: *Deleted

## 2016-03-21 ENCOUNTER — Ambulatory Visit (HOSPITAL_BASED_OUTPATIENT_CLINIC_OR_DEPARTMENT_OTHER)
Admission: RE | Admit: 2016-03-21 | Discharge: 2016-03-21 | Disposition: A | Discharge status: Home / Self Care | Payer: Medicare Other | Source: Ambulatory Visit | Attending: Surgery | Admitting: Surgery

## 2016-03-21 DIAGNOSIS — Z452 Encounter for adjustment and management of vascular access device: Secondary | ICD-10-CM | POA: Diagnosis not present

## 2016-03-21 DIAGNOSIS — E119 Type 2 diabetes mellitus without complications: Secondary | ICD-10-CM | POA: Insufficient documentation

## 2016-03-21 DIAGNOSIS — Z803 Family history of malignant neoplasm of breast: Secondary | ICD-10-CM | POA: Insufficient documentation

## 2016-03-21 DIAGNOSIS — I350 Nonrheumatic aortic (valve) stenosis: Secondary | ICD-10-CM | POA: Insufficient documentation

## 2016-03-21 DIAGNOSIS — Z923 Personal history of irradiation: Secondary | ICD-10-CM | POA: Insufficient documentation

## 2016-03-21 DIAGNOSIS — Z9221 Personal history of antineoplastic chemotherapy: Secondary | ICD-10-CM | POA: Insufficient documentation

## 2016-03-21 DIAGNOSIS — K219 Gastro-esophageal reflux disease without esophagitis: Secondary | ICD-10-CM | POA: Insufficient documentation

## 2016-03-21 DIAGNOSIS — E7800 Pure hypercholesterolemia, unspecified: Secondary | ICD-10-CM | POA: Insufficient documentation

## 2016-03-21 DIAGNOSIS — Z853 Personal history of malignant neoplasm of breast: Secondary | ICD-10-CM | POA: Insufficient documentation

## 2016-03-21 DIAGNOSIS — Z6841 Body Mass Index (BMI) 40.0 and over, adult: Secondary | ICD-10-CM | POA: Insufficient documentation

## 2016-03-21 DIAGNOSIS — R600 Localized edema: Secondary | ICD-10-CM | POA: Insufficient documentation

## 2016-03-21 DIAGNOSIS — Z79899 Other long term (current) drug therapy: Secondary | ICD-10-CM | POA: Insufficient documentation

## 2016-03-21 HISTORY — DX: Type 2 diabetes mellitus without complications: E11.9

## 2016-03-21 HISTORY — PX: PORT-A-CATH REMOVAL: SHX5289

## 2016-03-21 HISTORY — DX: Personal history of malignant neoplasm of breast: Z85.3

## 2016-03-21 SURGERY — MINOR REMOVAL PORT-A-CATH
Anesthesia: LOCAL | Site: Chest | Laterality: Right

## 2016-03-21 MED ORDER — CHLORHEXIDINE GLUCONATE CLOTH 2 % EX PADS: 6.0000 | MEDICATED_PAD | Freq: Once | CUTANEOUS | Status: DC

## 2016-03-21 MED ORDER — LIDOCAINE HCL (PF) 1 % IJ SOLN
INTRAMUSCULAR | Status: AC
  Filled 2016-03-21: qty 30

## 2016-03-21 MED ORDER — BUPIVACAINE HCL (PF) 0.25 % IJ SOLN
INTRAMUSCULAR | Status: AC
  Filled 2016-03-21: qty 30

## 2016-03-21 MED ORDER — MIDAZOLAM HCL 2 MG/2ML IJ SOLN: 1.0000 mg | INTRAMUSCULAR | Status: DC | PRN

## 2016-03-21 MED ORDER — SCOPOLAMINE 1 MG/3DAYS TD PT72: 1.0000 | MEDICATED_PATCH | Freq: Once | TRANSDERMAL | Status: DC | PRN

## 2016-03-21 MED ORDER — LACTATED RINGERS IV SOLN: INTRAVENOUS | Status: DC

## 2016-03-21 MED ORDER — FENTANYL CITRATE (PF) 100 MCG/2ML IJ SOLN: 50.0000 ug | INTRAMUSCULAR | Status: DC | PRN

## 2016-03-21 MED ORDER — LIDOCAINE HCL 1 % IJ SOLN
INTRAMUSCULAR | Status: DC | PRN
  Administered 2016-03-21: 8.5 mL

## 2016-03-21 MED ORDER — BUPIVACAINE-EPINEPHRINE (PF) 0.5% -1:200000 IJ SOLN
INTRAMUSCULAR | Status: AC
  Filled 2016-03-21: qty 30

## 2016-03-21 MED ORDER — BUPIVACAINE-EPINEPHRINE 0.5% -1:200000 IJ SOLN
INTRAMUSCULAR | Status: DC | PRN
  Administered 2016-03-21: 8.5 mL

## 2016-03-21 SURGICAL SUPPLY — 29 items
BANDAGE ADH SHEER 1  50/CT (GAUZE/BANDAGES/DRESSINGS) IMPLANT
BENZOIN TINCTURE PRP APPL 2/3 (GAUZE/BANDAGES/DRESSINGS) IMPLANT
BLADE SURG 15 STRL LF DISP TIS (BLADE) ×1 IMPLANT
BLADE SURG 15 STRL SS (BLADE) ×1
COVER SURGICAL LIGHT HANDLE (MISCELLANEOUS) ×2 IMPLANT
DERMABOND ADVANCED (GAUZE/BANDAGES/DRESSINGS) ×1
DERMABOND ADVANCED .7 DNX12 (GAUZE/BANDAGES/DRESSINGS) ×1 IMPLANT
DRAPE UTILITY XL STRL (DRAPES) ×2 IMPLANT
GAUZE SPONGE 4X4 16PLY XRAY LF (GAUZE/BANDAGES/DRESSINGS) IMPLANT
GLOVE SURG SIGNA 7.5 PF LTX (GLOVE) ×4 IMPLANT
MARKER SKIN DUAL TIP RULER LAB (MISCELLANEOUS) ×2 IMPLANT
NDL SAFETY ECLIPSE 18X1.5 (NEEDLE) ×1 IMPLANT
NEEDLE HYPO 18GX1.5 SHARP (NEEDLE) ×1
NEEDLE HYPO 25X1 1.5 SAFETY (NEEDLE) IMPLANT
NEEDLE HYPO 25X5/8 SAFETYGLIDE (NEEDLE) ×2 IMPLANT
NS IRRIG 1000ML POUR BTL (IV SOLUTION) IMPLANT
SPONGE GAUZE 4X4 12PLY STER LF (GAUZE/BANDAGES/DRESSINGS) IMPLANT
STRIP CLOSURE SKIN 1/2X4 (GAUZE/BANDAGES/DRESSINGS) IMPLANT
STRIP CLOSURE SKIN 1/4X4 (GAUZE/BANDAGES/DRESSINGS) IMPLANT
SUT MNCRL AB 4-0 PS2 18 (SUTURE) IMPLANT
SUT VIC AB 3-0 SH 27 (SUTURE)
SUT VIC AB 3-0 SH 27X BRD (SUTURE) IMPLANT
SUT VIC AB 5-0 PS2 18 (SUTURE) ×2 IMPLANT
SUT VICRYL 3-0 CR8 SH (SUTURE) ×2 IMPLANT
SUT VICRYL 4-0 PS2 18IN ABS (SUTURE) IMPLANT
SWABSTICK POVIDONE IODINE SNGL (MISCELLANEOUS) IMPLANT
SYR CONTROL 10ML LL (SYRINGE) ×2 IMPLANT
TOWEL OR NON WOVEN STRL DISP B (DISPOSABLE) ×2 IMPLANT
UNDERPAD 30X30 (UNDERPADS AND DIAPERS) IMPLANT

## 2016-03-21 NOTE — Interval H&P Note (Signed)
History and Physical Interval Note:  03/21/2016 8:47 AM  Kristy Harris  has presented today for surgery, with the diagnosis of BREAST CANCER  The various methods of treatment have been discussed with the patient and family.  She is here with her daughter.  After consideration of risks, benefits and other options for treatment, the patient has consented to  Procedure(s): MINOR REMOVAL PORT-A-CATH (N/A) as a surgical intervention .  The patient's history has been reviewed, patient examined, no change in status, stable for surgery.  I have reviewed the patient's chart and labs.  Questions were answered to the patient's satisfaction.     Cayle Cordoba H

## 2016-03-21 NOTE — Op Note (Signed)
03/21/2016  9:32 AM  PATIENT:  Kristy Harris, 71 y.o., female, MRN: [id number]  PREOP DIAGNOSIS:  BREAST CANCER, Completion of chemotx  POSTOP DIAGNOSIS:   BREAST CANCER, Completion of chemotx  PROCEDURE:   Procedure(s): REMOVAL PORT-A-CATH  SURGEON:   Alphonsa Overall, M.D.  ANESTHESIA:   Local  EBL:  minimal  ml  LOCAL MEDICATIONS USED:   17 cc of mixture of 1% xylocaine + 1/2% marcaine  SPECIMEN:   none  COUNTS CORRECT:  YES  INDICATIONS FOR PROCEDURE:  Kristy Harris is a 71 y.o. (DOB: [birth year]) AA female whose primary care physician is [name], MD and comes for power port removal.   She has completed chemotherapy supervised by Dr. Whitney Muse for a left breast cancer.   The indications and risks of the surgery were explained to the patient.  The risks include, but are not limited to, infection, bleeding, and nerve injury.  PROCEDURE:   The patient was taken to room #8 at [facility] Day Surgery.  Her right upper chest was prepped with chloroprep.   A time out was held and the surgical checklist run.   I infiltrated the skin with 17 cc of a mixture of 1% xylocaine with 1/2% marcaine with epinephrine.   I made an incision through her old scar just above the power port.  I removed the port intact.  The wound was closed in layers, 3-0 vicryl fror the subcutaneous tissues and 5-0 vicryl for the skin.   The wound was painted with DermaBond.  The patient was transported to the recovery room in good condition.  Alphonsa Overall, MD, [facility] Surgery Pager: [phone] Office phone:  [phone]

## 2016-03-21 NOTE — Discharge Instructions (Signed)
[facility] - DISCHARGE INSTRUCTIONS TO PATIENT  Activity:  Driving - May drive any time.   Lifting - No limit  Wound Care:   May shower tomorrow night.  Diet:  As tolerated.  Follow up appointment:  Call Dr. Pollie Friar office [facility]) at [phone] for an appointment in 4 months for your routine breast follow up.  Medications and dosages:  Resume your home medications.  Call Dr. Lucia Gaskins or his office  [phone]) if you have:  Temperature greater than 100.4,  Redness, tenderness, or signs of infection (pain, swelling, redness, odor or green/yellow discharge around the site),  Any other questions or concerns you may have after discharge.  In an emergency, call 911 or go to an Emergency Department at a nearby hospital.

## 2016-03-24 ENCOUNTER — Encounter (HOSPITAL_BASED_OUTPATIENT_CLINIC_OR_DEPARTMENT_OTHER): Payer: Self-pay | Admitting: Surgery

## 2016-03-31 ENCOUNTER — Encounter (HOSPITAL_COMMUNITY): Payer: Self-pay | Admitting: Hematology & Oncology

## 2016-04-04 ENCOUNTER — Ambulatory Visit (HOSPITAL_COMMUNITY): Payer: Medicare Other | Admitting: Hematology & Oncology

## 2016-05-05 ENCOUNTER — Encounter (HOSPITAL_COMMUNITY): Payer: Medicare Other | Attending: Oncology | Admitting: Oncology

## 2016-05-05 ENCOUNTER — Encounter (HOSPITAL_COMMUNITY): Payer: Self-pay | Admitting: Oncology

## 2016-05-05 VITALS — BP 99/65 | HR 73 | Temp 97.8°F | Resp 18 | Ht 64.0 in | Wt 279.0 lb

## 2016-05-05 DIAGNOSIS — C50412 Malignant neoplasm of upper-outer quadrant of left female breast: Secondary | ICD-10-CM | POA: Diagnosis present

## 2016-05-05 DIAGNOSIS — Z170 Estrogen receptor positive status [ER+]: Secondary | ICD-10-CM

## 2016-05-05 DIAGNOSIS — C773 Secondary and unspecified malignant neoplasm of axilla and upper limb lymph nodes: Secondary | ICD-10-CM

## 2016-05-05 NOTE — Patient Instructions (Signed)
[place] at [facility] Discharge Instructions  RECOMMENDATIONS MADE BY THE CONSULTANT AND ANY TEST RESULTS WILL BE SENT TO YOUR REFERRING PHYSICIAN.  You were seen today by Kirby Crigler PA-C. No labs today. Continue taking Arimidex daily. Labs and follow up in 3 months.  Thank you for choosing [place] at [facility] to provide your oncology and hematology care.  To afford each patient quality time with our provider, please arrive at least 15 minutes before your scheduled appointment time.    If you have a lab appointment with the [place] please come in thru the  Main Entrance and check in at the main information desk  You need to re-schedule your appointment should you arrive 10 or more minutes late.  We strive to give you quality time with our providers, and arriving late affects you and other patients whose appointments are after yours.  Also, if you no show three or more times for appointments you may be dismissed from the clinic at the providers discretion.     Again, thank you for choosing [facility].  Our hope is that these requests will decrease the amount of time that you wait before being seen by our physicians.       _____________________________________________________________  Should you have questions after your visit to [facility], please contact our office at [phone] between the hours of 8:30 a.m. and 4:30 p.m.  Voicemails left after 4:30 p.m. will not be returned until the following business day.  For prescription refill requests, have your pharmacy contact our office.       Resources For Cancer Patients and their Caregivers ? American Cancer Society: Can assist with transportation, wigs, general needs, runs Look Good Feel Better.        6363041062 ? Cancer Care: Provides financial assistance, online support groups, medication/co-pay assistance.  1-800-813-HOPE  ([phone]) ? [place] Assists [place] cancer patients and their families through emotional , educational and financial support.  [phone] ? [place] DSS Where to apply for food stamps, Medicaid and utility assistance. 825-370-0516 ? RCATS: Transportation to medical appointments. 351-665-6539 ? Social Security Administration: May apply for disability if have a Stage IV cancer. 641 [phone] ? [employer], Disability and Transit Services: Assists with nutrition, care and transit needs. [place] Support Programs: @10RELATIVEDAYS @ > Cancer Support Group  2nd Tuesday of the month 1pm-2pm, Journey Room  > Creative Journey  3rd Tuesday of the month 1130am-1pm, Journey Room  > Look Good Feel Better  1st Wednesday of the month 10am-12 noon, Journey Room (Call [place] to register [phone])

## 2016-05-05 NOTE — Assessment & Plan Note (Signed)
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  S/P left lumpectomy on 12/25/2014 followed by adjuvant chemotherapy consisting of Abraxane/Herceptin (02/01/2015- 04/19/2015).  She then underwent XRT by Dr. Lisbeth Renshaw (05/08/2015- 06/22/2015) with continuation of Herceptin x 52 weeks finishing on 02/07/2016.  She is now on an aromatase inhibitor, Arimidex, beginning on 07/13/2015.  Oncology history is updated.  No oncology role for labs today.  Labs in 3 months: CBC diff, CMET.  I personally reviewed and went over laboratory results with the patient.  The results are noted within this dictation.  I personally reviewed and went over radiographic studies with the patient.  The results are noted within this dictation.  Mammogram on 11/06/2015 was NEGATIVE.  She will be due for her next mammogram in Sept 2018.  Order is placed for diagnostic mammogram in Sept 2018.  Return in 3 months for follow-up.

## 2016-05-05 NOTE — Progress Notes (Signed)
[name], MD [place] [address]  Malignant neoplasm of upper-outer quadrant of left breast in female, estrogen receptor positive ([place]) - Plan: CBC with Differential, Comprehensive metabolic panel, MM DIAG BREAST TOMO BILATERAL  CURRENT THERAPY: Arimidex beginning on 07/13/2015.  INTERVAL HISTORY: Kristy Harris 71 y.o. 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  S/P left lumpectomy on 12/25/2014 followed by adjuvant chemotherapy consisting of Abraxane/Herceptin (02/01/2015- 04/19/2015).  She then underwent XRT by Dr. Lisbeth Renshaw (05/08/2015- 06/22/2015) with continuation of Herceptin x 52 weeks finishing on 02/07/2016.  She is now on an aromatase inhibitor, Arimidex, beginning on 07/13/2015.    Breast cancer of upper-outer quadrant of left female breast ([place])   11/01/2014 Mammogram    Possible mass in the left breast upper outer quadrant measuring 13 mm suspicious for breast cancer confirmed through ultrasound a spiculated hypoechoic mass ill-defined, no enlarged lymph nodes      11/01/2014 Initial Diagnosis    Invasive ductal carcinoma, moderately differentiated, ER > 90%, PR> 90%, HER-2 -2+ by IHC, ratio 1.15, KI 67: 29%, T1 cN0 stage IA clinical stage      11/24/2014 Echocardiogram    Systolic function was normal. The estimated ejection fraction was in the range of 55% to 60%.       12/25/2014 Surgery    Left lumpectomy: IDC 1.7 cm, positive for LVI, with DCIS, 1/1 sentinel node positive deposit 1.9 cm with extracapsular extension, ER 90%, PR 90%, HER-2 positive ratio 2.4, Ki-67 29% T1 cN1 stage II a      02/01/2015 - 04/19/2015 Chemotherapy    Abraxane/Herceptin      03/20/2015 Echocardiogram    Systolic function wasnormal. The estimated ejection fraction was in the range of 60%to 65%.      05/08/2015 - 06/22/2015 Radiation Therapy     Dr. Lisbeth Renshaw       05/10/2015 - 02/07/2016 Antibody Plan    Herceptin every 21 days to complete 52 weeks worth of therapy.      06/13/2015 Echocardiogram    2D echo- The estimated ejection fraction was in the range of 60% to 65%. Diastolic function is abnormal, indeterminate grade. Wallmotion was normal.      06/28/2015 Imaging    Bone density- BMD as determined from Femur Total Left is 0.994 g/cm2 with a T-Score of -0.1. This patient is considered normal according to [facility]) criteria.       07/13/2015 -  Anti-estrogen oral therapy    Arimidex daily      09/12/2015 Echocardiogram    The estimated ejection fraction was in the range of 60% to 65%. Wall motion was normal; there were no regional wall motion abnormalities. Doppler parameters are consistent with abnormal L ventricular relaxation (grade 1 diastolic dysfunction).      12/04/2015 Echocardiogram    Left ventricle: The cavity size was normal. Wall thickness was   increased increased in a pattern of mild to moderate LVH.   Systolic function was normal. The estimated ejection fraction was   in the range of 60% to 65%. Wall motion was normal; there were no   regional wall motion abnormalities. Features are consistent with   a pseudonormal left ventricular filling pattern, with concomitant   abnormal relaxation and increased filling pressure (grade 2   diastolic dysfunction). Doppler parameters are consistent with   high ventricular filling  pressure.      03/21/2016 Procedure    Port removed by Dr. Lucia Gaskins       "I feel great."  She denies any oncology related complaints.  She is compliant with her AI therapy.  She denies any issues with side effects of AI therapy, including arthralgias, myalgias, hot flashes.  She is performing her own breast exams.  She denies any new findings omn her own exam.  Review of Systems  Constitutional: Negative.  Negative for chills, fever and weight loss.  HENT:  Negative.   Eyes: Negative.   Respiratory: Negative.  Negative for cough.   Cardiovascular: Negative.  Negative for chest pain.  Gastrointestinal: Negative.  Negative for blood in stool, constipation, diarrhea, melena, nausea and vomiting.  Genitourinary: Negative.   Musculoskeletal: Negative.   Skin: Negative.   Neurological: Negative.  Negative for weakness.  Endo/Heme/Allergies: Negative.   Psychiatric/Behavioral: Negative.     Past Medical History:  Diagnosis Date  . Anemia    takes iron supplement  . Arthritis    knees  . COPD (chronic obstructive pulmonary disease) ([place])   . GERD (gastroesophageal reflux disease)   . Hidradenitis suppurativa    buttocks  . History of breast cancer 12/2014  . Hyperlipidemia   . Hypertension    states under control with meds., has been on med. > 40 yrs.  . Morbid obesity ([place])   . Non-insulin dependent type 2 diabetes mellitus ([place])   . Shortness of breath dyspnea    with exertion  . Urinary frequency     Past Surgical History:  Procedure Laterality Date  . ABDOMINAL HYSTERECTOMY  25 yrs ago   partial  . BLADDER SUSPENSION     x 2  . BREAST LUMPECTOMY WITH RADIOACTIVE SEED AND SENTINEL LYMPH NODE BIOPSY Left 12/25/2014   Procedure: RADIOACTIVE SEED GIUDED LEFT BREAST LUMPECTOMY, LEFT AXILLARY SENTINEL LYMPH NODE BIOPSY;  Surgeon: Alphonsa Overall, MD;  Location: [place];  Service: General;  Laterality: Left;  . CARDIAC CATHETERIZATION  02/21/2011   Normal coronary arteries, normal EF  . COLONOSCOPY WITH PROPOFOL  11/18/2013  . ESOPHAGOGASTRODUODENOSCOPY (EGD) WITH PROPOFOL  11/18/2013  . EXCISION HYDRADENITIS LABIA N/A 12/08/2013   Procedure: EXCISION HIDRADENITIS PUBIC AREA;  Surgeon: Alphonsa Overall, MD;  Location: [facility];  Service: General;  Laterality: N/A;  . HYDRADENITIS EXCISION Left 12/08/2013   Procedure: EXCISION HIDRADENITIS AXILLA;  Surgeon: Alphonsa Overall, MD;  Location: [facility];  Service: General;  Laterality: Left;  . INCISION  AND DRAINAGE ABSCESS  08/17/2008   perineum and buttock  . IRRIGATION AND DEBRIDEMENT ABSCESS Right 12/23/2012   Procedure: incision  AND DEBRIDEMENT right buttock infection ;  Surgeon: Shann Medal, MD;  Location: [facility];  Service: General;  Laterality: Right;  . PILONIDAL CYST / SINUS EXCISION  06/04/2004  . PILONIDAL CYST EXCISION    . PORT-A-CATH REMOVAL Right 03/21/2016   Procedure: MINOR REMOVAL PORT-A-CATH;  Surgeon: Alphonsa Overall, MD;  Location: [facility];  Service: General;  Laterality: Right;  MINOR REMOVAL PORT-A-CATH  . PORTACATH PLACEMENT N/A 01/12/2015   Procedure: INSERTION PORT-A-CATH;  Surgeon: Alphonsa Overall, MD;  Location: [facility];  Service: General;  Laterality: N/A;  . TONSILLECTOMY      Family History  Problem Relation Age of Onset  . Heart attack Mother     had multiple health problems  . Lung cancer Father 72    smoker  . Prostate cancer Brother   . Lung cancer Brother  dx. 69s; smoker  . Throat cancer Brother     dx. 31s; smoker  . Diabetes Sister     has 3 living sisters with multiple health problems  . Hypertension Son   . Hypertension Daughter   . Breast cancer Sister 31  . Breast cancer Sister     dx. 68s  . Breast cancer Maternal Grandmother     dx. 61s  . Breast cancer Maternal Aunt     dx. older than 86  . Dementia Maternal Aunt   . Breast cancer Cousin     dx. 53s  . Hypertension Brother     Social History   Social History  . Marital status: Widowed    Spouse name: N/A  . Number of children: 5  . Years of education: N/A   Occupational History  . Textile work     Disabled   Social History Main Topics  . Smoking status: Former Smoker    Years: 0.00    Quit date: 03/03/1994  . Smokeless tobacco: Never Used  . Alcohol use No  . Drug use: No  . Sexual activity: Not Asked   Other Topics Concern  . None   Social History Narrative   Lives with grandson.  Widowed.  Has 2 sons and 3 daughters     PHYSICAL  EXAMINATION  ECOG PERFORMANCE STATUS: 1 - Symptomatic but completely ambulatory  Vitals:   05/05/16 1533  BP: 99/65  Pulse: 73  Resp: 18  Temp: 97.8 F (36.6 C)    GENERAL:alert, no distress, well nourished, well developed, comfortable, cooperative, obese, smiling and in wheelchair, accompanied by daughter and grand-daughter. SKIN: skin color, texture, turgor are normal, no rashes or significant lesions HEAD: Normocephalic, No masses, lesions, tenderness or abnormalities EYES: normal, EOMI, Conjunctiva are pink and non-injected EARS: External ears normal OROPHARYNX:lips, buccal mucosa, and tongue normal and mucous membranes are moist  NECK: supple, no adenopathy, thyroid normal size, non-tender, without nodularity, trachea midline LYMPH:  no palpable lymphadenopathy BREAST:risk and benefit of breast self-exam was discussed LUNGS: clear to auscultation and percussion HEART: regular rate & rhythm, no murmurs, no gallops, S1 normal and S2 normal ABDOMEN:abdomen soft, non-tender, obese and normal bowel sounds BACK: Back symmetric, no curvature. EXTREMITIES:less then 2 second capillary refill, no joint deformities, effusion, or inflammation, no skin discoloration, no cyanosis  NEURO: alert & oriented x 3 with fluent speech, no focal motor/sensory deficits, gait normal   LABORATORY DATA: CBC    Component Value Date/Time   WBC 5.0 02/07/2016 0948   RBC 4.01 02/07/2016 0948   HGB 11.1 (L) 02/07/2016 0948   HCT 35.3 (L) 02/07/2016 0948   PLT 189 02/07/2016 0948   MCV 88.0 02/07/2016 0948   MCH 27.7 02/07/2016 0948   MCHC 31.4 02/07/2016 0948   RDW 14.6 02/07/2016 0948   LYMPHSABS 1.8 02/07/2016 0948   MONOABS 0.2 02/07/2016 0948   EOSABS 0.2 02/07/2016 0948   BASOSABS 0.0 02/07/2016 0948      Chemistry      Component Value Date/Time   NA 136 03/18/2016 1300   K 4.9 03/18/2016 1300   CL 99 (L) 03/18/2016 1300   CO2 27 03/18/2016 1300   BUN 8 03/18/2016 1300   CREATININE  0.70 03/18/2016 1300      Component Value Date/Time   CALCIUM 9.5 03/18/2016 1300   ALKPHOS 112 02/07/2016 0948   AST 25 02/07/2016 0948   ALT 28 02/07/2016 0948   BILITOT 0.5 02/07/2016 0948  PENDING LABS:   RADIOGRAPHIC STUDIES:  No results found.   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  S/P left lumpectomy on 12/25/2014 followed by adjuvant chemotherapy consisting of Abraxane/Herceptin (02/01/2015- 04/19/2015).  She then underwent XRT by Dr. Lisbeth Renshaw (05/08/2015- 06/22/2015) with continuation of Herceptin x 52 weeks finishing on 02/07/2016.  She is now on an aromatase inhibitor, Arimidex, beginning on 07/13/2015.  Oncology history is updated.  No oncology role for labs today.  Labs in 3 months: CBC diff, CMET.  I personally reviewed and went over laboratory results with the patient.  The results are noted within this dictation.  I personally reviewed and went over radiographic studies with the patient.  The results are noted within this dictation.  Mammogram on 11/06/2015 was NEGATIVE.  She will be due for her next mammogram in Sept 2018.  Order is placed for diagnostic mammogram in Sept 2018.  Return in 3 months for follow-up.   ORDERS PLACED FOR THIS ENCOUNTER: Orders Placed This Encounter  Procedures  . MM DIAG BREAST TOMO BILATERAL  . CBC with Differential  . Comprehensive metabolic panel    MEDICATIONS PRESCRIBED THIS ENCOUNTER: Meds ordered this encounter  Medications  . fluconazole (DIFLUCAN) 100 MG tablet    Sig: TAKE ONE (1) TABLET EACH DAY  . gabapentin (NEURONTIN) 100 MG capsule    Sig: Take by mouth.  . DISCONTD: furosemide (LASIX) 20 MG tablet    Sig: TAKE ONE (1) TABLET EACH DAY  . metFORMIN (GLUCOPHAGE-XR) 500 MG 24 hr tablet    Sig: TAKE ONE TABLET BY MOUTH  TWICE DAILY  . lidocaine-prilocaine (EMLA) cream    Sig: APPLY A QUARTER SIXE AMOUNT TO PORT SITE 1 HOUR PRIO TO CHEMO. DO NOT RUB IN. COVER W/ PLASTIC WRAP    Refill:  3    THERAPY PLAN:  NCCN guidelines recommends the following surveillance for invasive breast cancer (2.2017):  A. History and Physical exam 1-4 times per year as clinically appropriate for 5 years, then annually.  B. Periodic screening for changes in family history and referral to genetics counseling as indicated  C. Educate, monitor, and refer to lymphedema management.  D. Mammography every 12 months  E. Routine imaging of reconstructed breast is not indicated.  F. In the absence of clinical signs and symptoms suggestive of recurrent disease, there is no indication for laboratory or imaging studies for metastases screening.  G. Women on Tamoxifen: annual gynecologic assessment every 12 months if uterus is present.  H. Women on aromatase inhibitor or who experience ovarian failure secondary to treatment should have monitoring of bone health with a bone mineral density determination at baseline and periodically thereafter.  I. Assess and encourage adherence to adjuvant endocrine therapy.  J. Evidence suggests that active lifestyle, healthy diet, limited alcohol intake, and achieving and maintaining an ideal body weight (20-25 BMI) may lead to optimal breast cancer outcomes.   All questions were answered. The patient knows to call the clinic with any problems, questions or concerns. We can certainly see the patient much sooner if necessary.  Patient and plan discussed with Dr. Twana First and she is in agreement with the aforementioned.   This note is electronically signed by: Doy Mince 05/05/2016 3:45 PM

## 2016-05-23 ENCOUNTER — Other Ambulatory Visit (HOSPITAL_COMMUNITY): Payer: Self-pay | Admitting: Hematology & Oncology

## 2016-06-05 ENCOUNTER — Other Ambulatory Visit (HOSPITAL_COMMUNITY): Payer: Self-pay | Admitting: Hematology & Oncology

## 2016-06-05 DIAGNOSIS — C50412 Malignant neoplasm of upper-outer quadrant of left female breast: Secondary | ICD-10-CM

## 2016-08-07 ENCOUNTER — Encounter (HOSPITAL_COMMUNITY): Payer: Self-pay | Admitting: Adult Health

## 2016-08-07 ENCOUNTER — Ambulatory Visit (HOSPITAL_COMMUNITY): Payer: Medicare Other | Admitting: Adult Health

## 2016-08-07 ENCOUNTER — Encounter (HOSPITAL_COMMUNITY): Payer: Medicare Other | Attending: Oncology

## 2016-08-07 ENCOUNTER — Encounter (HOSPITAL_BASED_OUTPATIENT_CLINIC_OR_DEPARTMENT_OTHER): Payer: Medicare Other | Admitting: Adult Health

## 2016-08-07 VITALS — BP 144/83 | HR 83 | Temp 97.6°F | Resp 16 | Ht 64.0 in

## 2016-08-07 DIAGNOSIS — N898 Other specified noninflammatory disorders of vagina: Secondary | ICD-10-CM

## 2016-08-07 DIAGNOSIS — C50412 Malignant neoplasm of upper-outer quadrant of left female breast: Secondary | ICD-10-CM | POA: Diagnosis present

## 2016-08-07 DIAGNOSIS — N951 Menopausal and female climacteric states: Secondary | ICD-10-CM

## 2016-08-07 DIAGNOSIS — E119 Type 2 diabetes mellitus without complications: Secondary | ICD-10-CM | POA: Diagnosis not present

## 2016-08-07 DIAGNOSIS — M545 Low back pain: Secondary | ICD-10-CM | POA: Diagnosis not present

## 2016-08-07 DIAGNOSIS — G620 Drug-induced polyneuropathy: Secondary | ICD-10-CM | POA: Diagnosis not present

## 2016-08-07 DIAGNOSIS — G8929 Other chronic pain: Secondary | ICD-10-CM | POA: Diagnosis not present

## 2016-08-07 DIAGNOSIS — Z170 Estrogen receptor positive status [ER+]: Secondary | ICD-10-CM | POA: Insufficient documentation

## 2016-08-07 DIAGNOSIS — C50919 Malignant neoplasm of unspecified site of unspecified female breast: Secondary | ICD-10-CM

## 2016-08-07 LAB — COMPREHENSIVE METABOLIC PANEL
ALBUMIN: 4 g/dL (ref 3.5–5.0)
ALK PHOS: 117 U/L (ref 38–126)
ALT: 28 U/L (ref 14–54)
ANION GAP: 11 (ref 5–15)
AST: 26 U/L (ref 15–41)
BILIRUBIN TOTAL: 0.6 mg/dL (ref 0.3–1.2)
BUN: 15 mg/dL (ref 6–20)
CALCIUM: 9.6 mg/dL (ref 8.9–10.3)
CO2: 26 mmol/L (ref 22–32)
Chloride: 100 mmol/L — ABNORMAL LOW (ref 101–111)
Creatinine, Ser: 0.72 mg/dL (ref 0.44–1.00)
GFR calc Af Amer: >60 mL/min (ref >60)
GLUCOSE: 155 mg/dL — AB (ref 65–99)
Potassium: 4.1 mmol/L (ref 3.5–5.1)
Sodium: 137 mmol/L (ref 135–145)
TOTAL PROTEIN: 8.5 g/dL — AB (ref 6.5–8.1)

## 2016-08-07 LAB — CBC WITH DIFFERENTIAL/PLATELET
BASOS PCT: 0 %
Basophils Absolute: 0 10*3/uL (ref 0.0–0.1)
Eosinophils Absolute: 0.2 10*3/uL (ref 0.0–0.7)
Eosinophils Relative: 3 %
HEMATOCRIT: 37.3 % (ref 36.0–46.0)
HEMOGLOBIN: 12 g/dL (ref 12.0–15.0)
LYMPHS PCT: 29 %
Lymphs Abs: 1.7 10*3/uL (ref 0.7–4.0)
MCH: 28.1 pg (ref 26.0–34.0)
MCHC: 32.2 g/dL (ref 30.0–36.0)
MCV: 87.4 fL (ref 78.0–100.0)
MONO ABS: 0.3 10*3/uL (ref 0.1–1.0)
MONOS PCT: 6 %
NEUTROS ABS: 3.8 10*3/uL (ref 1.7–7.7)
NEUTROS PCT: 63 %
Platelets: ADEQUATE 10*3/uL (ref 150–400)
RBC: 4.27 MIL/uL (ref 3.87–5.11)
RDW: 14.5 % (ref 11.5–15.5)
WBC: 6.1 10*3/uL (ref 4.0–10.5)

## 2016-08-07 NOTE — Patient Instructions (Addendum)
[facility] at [facility] Discharge Instructions  RECOMMENDATIONS MADE BY THE CONSULTANT AND ANY TEST RESULTS WILL BE SENT TO YOUR REFERRING PHYSICIAN.  You were seen today by Gretchen Dawson NP. Return in 6 months for labs and follow up.  Thank you for choosing [facility] at [facility] to provide your oncology and hematology care.  To afford each patient quality time with our provider, please arrive at least 15 minutes before your scheduled appointment time.    If you have a lab appointment with the Cancer Center please come in thru the  Main Entrance and check in at the main information desk  You need to re-schedule your appointment should you arrive 10 or more minutes late.  We strive to give you quality time with our providers, and arriving late affects you and other patients whose appointments are after yours.  Also, if you no show three or more times for appointments you may be dismissed from the clinic at the providers discretion.     Again, thank you for choosing [facility].  Our hope is that these requests will decrease the amount of time that you wait before being seen by our physicians.       _____________________________________________________________  Should you have questions after your visit to [facility], please contact our office at [phone] between the hours of 8:30 a.m. and 4:30 p.m.  Voicemails left after 4:30 p.m. will not be returned until the following business day.  For prescription refill requests, have your pharmacy contact our office.       Resources For Cancer Patients and their Caregivers ? American Cancer Society: Can assist with transportation, wigs, general needs, runs Look Good Feel Better.        1-888-227-6333 ? Cancer Care: Provides financial assistance, online support groups, medication/co-pay assistance.  1-800-813-HOPE (4673) ? [facility] Assists [place] cancer patients and their families through emotional , educational and financial support.  336-427-4357 ? [place] DSS Where to apply for food stamps, Medicaid and utility assistance. 336-342-1394 ? [facility]: Transportation to medical appointments. 336-347-2287 ? Social Security Administration: May apply for disability if have a Stage IV cancer. 336-342-7796 1-800-772-1213 ? [place] Aging, Disability and Transit Services: Assists with nutrition, care and transit needs. 336-349-2343  Cancer Center Support Programs: @10RELATIVEDAYS@ > Cancer Support Group  2nd Tuesday of the month 1pm-2pm, Journey Room  > Creative Journey  3rd Tuesday of the month 1130am-1pm, Journey Room  > Look Good Feel Better  1st Wednesday of the month 10am-12 noon, Journey Room (Call American Cancer Society to register 1-800-395-5775)    

## 2016-08-07 NOTE — Progress Notes (Signed)
[place], [place] [address]   CLINIC:  Medical Oncology/Hematology  PCP:  Dione Housekeeper, MD [address] 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+  CURRENT THERAPY: Arimidex daily    BRIEF ONCOLOGIC HISTORY:    Breast cancer of upper-outer quadrant of left female breast ([facility])   11/01/2014 Mammogram    Possible mass in the left breast upper outer quadrant measuring 13 mm suspicious for breast cancer confirmed through ultrasound a spiculated hypoechoic mass ill-defined, no enlarged lymph nodes      11/01/2014 Initial Diagnosis    Invasive ductal carcinoma, moderately differentiated, ER > 90%, PR> 90%, HER-2 -2+ by IHC, ratio 1.15, KI 67: 29%, T1 cN0 stage IA clinical stage      11/24/2014 Echocardiogram    Systolic function was normal. The estimated ejection fraction was in the range of 55% to 60%.       12/25/2014 Surgery    Left lumpectomy: IDC 1.7 cm, positive for LVI, with DCIS, 1/1 sentinel node positive deposit 1.9 cm with extracapsular extension, ER 90%, PR 90%, HER-2 positive ratio 2.4, Ki-67 29% T1 cN1 stage II a      02/01/2015 - 04/19/2015 Chemotherapy    Abraxane/Herceptin      03/20/2015 Echocardiogram    Systolic function wasnormal. The estimated ejection fraction was in the range of 60%to 65%.      05/08/2015 - 06/22/2015 Radiation Therapy    Dr. Lisbeth Renshaw       05/10/2015 - 02/07/2016 Antibody Plan    Herceptin every 21 days to complete 52 weeks worth of therapy.      06/13/2015 Echocardiogram    2D echo- The estimated ejection fraction was in the range of 60% to 65%. Diastolic function is abnormal, indeterminate grade. Wallmotion was normal.      06/28/2015 Imaging    Bone density- BMD as determined from Fem[facility] Total Left is 0.994 g/cm2 with a T-Score of -0.1. This patient is considered normal according to [facility]) criteria.       07/13/2015 -  Anti-estrogen oral therapy    Arimidex daily      09/12/2015 Echocardiogram    The estimated ejection fraction was in the range of 60% to 65%. Wall motion was normal; there were no regional wall motion abnormalities. Doppler parameters are consistent with abnormal L ventricular relaxation (grade 1 diastolic dysfunction).      12/04/2015 Echocardiogram    Left ventricle: The cavity size was normal. Wall thickness was   increased increased in a pattern of mild to moderate LVH.   Systolic function was normal. The estimated ejection fraction was   in the range of 60% to 65%. Wall motion was normal; there were no   regional wall motion abnormalities. Features are consistent with   a pseudonormal left ventricular filling pattern, with concomitant   abnormal relaxation and increased filling pressure (grade 2   diastolic dysfunction). Doppler parameters are consistent with   high ventricular filling pressure.      03/21/2016 Procedure    Port removed by Dr. Lucia Gaskins         INTERVAL HISTORY:  [name] 71 y.o. female presents to cancer center for routine follow-up for history of Stage II left breast cancer.   She is here today with her daughter; she is seen seated in wheelchair.  She continues on Arimidex with good  tolerance. She has arthralgias, particularly back pain, which has been chronic since before her cancer diagnosis. Generally requires 1-2 pain pills per day (pain managed by outside provider). She has occasional hot flashes, but they are manageable. Endorses occasional vaginal dryness and vaginal itching issues. She wears urinary incontinence pads and attributes her itching to this.  She has peripheral neuropathy to her hands/feet, which is "a little bit better than it was before"; she does have diabetes, which is managed by Dr. Lysbeth Galas.   Ambulates occasionally at home with walker; also has a cane. She uses a wheelchair when she is  out of the house.  A home health aide comes to her home to help with ADLs; her family helps with her ADLs as needed.   Denies any changes with her breasts or new concerns. Her last mammogram was in September.   Overall, she tells me she feels "pretty good."  Her appetite is 100%; energy levels are about 50%.     REVIEW OF SYSTEMS:  Review of Systems  Constitutional: Positive for fatigue.  HENT:  Negative.   Eyes: Negative.   Respiratory: Negative.   Cardiovascular: Negative.   Gastrointestinal: Negative.  Negative for abdominal pain, constipation, diarrhea, nausea and vomiting.  Genitourinary: Positive for bladder incontinence. Negative for dysuria, hematuria and vaginal bleeding.   Musculoskeletal: Positive for arthralgias and back pain.  Neurological: Positive for numbness. Negative for dizziness and headaches.  Hematological: Negative.   Psychiatric/Behavioral: Negative.      PAST MEDICAL/SURGICAL HISTORY:  Past Medical History:  Diagnosis Date  . Anemia    takes iron supplement  . Arthritis    knees  . COPD (chronic obstructive pulmonary disease) (HCC)   . GERD (gastroesophageal reflux disease)   . Hidradenitis suppurativa    buttocks  . History of breast cancer 12/2014  . Hyperlipidemia   . Hypertension    states under control with meds., has been on med. > 40 yrs.  . Morbid obesity (HCC)   . Non-insulin dependent type 2 diabetes mellitus (HCC)   . Shortness of breath dyspnea    with exertion  . Urinary frequency    Past Surgical History:  Procedure Laterality Date  . ABDOMINAL HYSTERECTOMY  25 yrs ago   partial  . BLADDER SUSPENSION     x 2  . BREAST LUMPECTOMY WITH RADIOACTIVE SEED AND SENTINEL LYMPH NODE BIOPSY Left 12/25/2014   Procedure: RADIOACTIVE SEED GIUDED LEFT BREAST LUMPECTOMY, LEFT AXILLARY SENTINEL LYMPH NODE BIOPSY;  Surgeon: [name], MD;  Location: [facility] OR;  Service: General;  Laterality: Left;  . CARDIAC CATHETERIZATION  02/21/2011    Normal coronary arteries, normal EF  . COLONOSCOPY WITH PROPOFOL  11/18/2013  . ESOPHAGOGASTRODUODENOSCOPY (EGD) WITH PROPOFOL  11/18/2013  . EXCISION HYDRADENITIS LABIA N/A 12/08/2013   Procedure: EXCISION HIDRADENITIS PUBIC AREA;  Surgeon: [name], MD;  Location: [facility];  Service: General;  Laterality: N/A;  . HYDRADENITIS EXCISION Left 12/08/2013   Procedure: EXCISION HIDRADENITIS AXILLA;  Surgeon: [name], MD;  Location: [facility];  Service: General;  Laterality: Left;  . INCISION AND DRAINAGE ABSCESS  08/17/2008   perineum and buttock  . IRRIGATION AND DEBRIDEMENT ABSCESS Right 12/23/2012   Procedure: incision  AND DEBRIDEMENT right buttock infection ;  Surgeon: Kandis Cocking, MD;  Location: [facility];  Service: General;  Laterality: Right;  . PILONIDAL CYST / SINUS EXCISION  06/04/2004  . PILONIDAL CYST EXCISION    . PORT-A-CATH REMOVAL Right 03/21/2016   Procedure: MINOR  REMOVAL PORT-A-CATH;  Surgeon: Alphonsa Overall, MD;  Location: [facility];  Service: General;  Laterality: Right;  MINOR REMOVAL PORT-A-CATH  . PORTACATH PLACEMENT N/A 01/12/2015   Procedure: INSERTION PORT-A-CATH;  Surgeon: Alphonsa Overall, MD;  Location: [facility];  Service: General;  Laterality: N/A;  . TONSILLECTOMY       SOCIAL HISTORY:  Social History   Social History  . Marital status: Widowed    Spouse name: N/A  . Number of children: 5  . Years of education: N/A   Occupational History  . Textile work     Disabled   Social History Main Topics  . Smoking status: Former Smoker    Years: 0.00    Quit date: 03/03/1994  . Smokeless tobacco: Never Used  . Alcohol use No  . Drug use: No  . Sexual activity: Not on file   Other Topics Concern  . Not on file   Social History Narrative   Lives with grandson.  Widowed.  Has 2 sons and 3 daughters    FAMILY HISTORY:  Family History  Problem Relation Age of Onset  . Heart attack Mother        had multiple health problems  . Lung cancer  Father 56       smoker  . Prostate cancer Brother   . Lung cancer Brother        dx. 6s; smoker  . Throat cancer Brother        dx. 28s; smoker  . Diabetes Sister        has 3 living sisters with multiple health problems  . Hypertension Son   . Hypertension Daughter   . Breast cancer Sister 66  . Breast cancer Sister        dx. 34s  . Breast cancer Maternal Grandmother        dx. 75s  . Breast cancer Maternal Aunt        dx. older than 41  . Dementia Maternal Aunt   . Breast cancer Cousin        dx. 39s  . Hypertension Brother     CURRENT MEDICATIONS:  Outpatient Encounter Prescriptions as of 08/07/2016  Medication Sig Note  . acetaminophen (TYLENOL) 500 MG tablet Take 1,000 mg by mouth every 6 (six) hours as needed for mild pain or headache.   . albuterol (PROVENTIL HFA;VENTOLIN HFA) 108 (90 BASE) MCG/ACT inhaler Inhale 2 puffs into the lungs every 4 (four) hours as needed for shortness of breath.   . anastrozole (ARIMIDEX) 1 MG tablet TAKE 1 TABLET (1 MG TOTAL) BY MOUTH DAILY.   Marland Kitchen aspirin EC 81 MG tablet Take 81 mg by mouth daily.   Marland Kitchen atorvastatin (LIPITOR) 20 MG tablet Take 20 mg by mouth every morning.    . cholecalciferol (VITAMIN D) 1000 units tablet Take 2,000 Units by mouth daily.   . ferrous sulfate 325 (65 FE) MG tablet Take 325 mg by mouth 3 (three) times daily with meals.   . fluconazole (DIFLUCAN) 100 MG tablet TAKE ONE (1) TABLET EACH DAY   . furosemide (LASIX) 20 MG tablet Take 20 mg by mouth 3 (three) times a week.   . gabapentin (NEURONTIN) 100 MG capsule Take by mouth.   Marland Kitchen HYDROcodone-acetaminophen (NORCO/VICODIN) 5-325 MG per tablet Take 1 tablet by mouth 2 (two) times daily as needed for moderate pain.    Marland Kitchen KLOR-CON M20 20 MEQ tablet TAKE 1 TABLET (20 MEQ TOTAL) BY MOUTH DAILY.   Marland Kitchen  lidocaine-prilocaine (EMLA) cream APPLY A QUARTER SIXE AMOUNT TO PORT SITE 1 HOUR PRIO TO CHEMO. DO NOT RUB IN. COVER W/ PLASTIC WRAP   . losartan (COZAAR) 100 MG tablet Take 100  mg by mouth daily.   . metFORMIN (GLUCOPHAGE-XR) 500 MG 24 hr tablet TAKE ONE TABLET BY MOUTH TWICE DAILY   . metoprolol succinate (TOPROL-XL) 100 MG 24 hr tablet TAKE ONE (1) TABLET EACH DAY 11/15/2015: Received from: [facility]  . pantoprazole (PROTONIX) 40 MG tablet TAKE 1 TABLET (40 MG TOTAL) BY MOUTH DAILY.   Marland Kitchen polyethylene glycol (MIRALAX / GLYCOLAX) packet Take 17 g by mouth daily as needed for mild constipation.   . verapamil (CALAN-SR) 120 MG CR tablet Take 120 mg by mouth daily.    No facility-administered encounter medications on file as of 08/07/2016.     ALLERGIES:  Allergies  Allergen Reactions  . Lisinopril Swelling and Cough    Mouth swelling  . Penicillins Nausea And Vomiting and Rash     PHYSICAL EXAM:  ECOG Performance status: 2-3 - Symptomatic; requires significant assistance with home health aide and family.   Vitals:   08/07/16 1309  BP: (!) 144/83  Pulse: 83  Resp: 16  Temp: 97.6 F (36.4 C)   There were no vitals filed for this visit.  Physical Exam  Constitutional: She is oriented to person, place, and time.  -Chronically-ill appearing female in no acute distress.  -Exam done with patient seated in wheelchair.   HENT:  Head: Normocephalic.  Mouth/Throat: Oropharynx is clear and moist. No oropharyngeal exudate.  Eyes: Conjunctivae are normal. Pupils are equal, round, and reactive to light. No scleral icterus.  Neck: Normal range of motion. Neck supple.  Cardiovascular: Normal rate and regular rhythm.   Pulmonary/Chest: Effort normal and breath sounds normal. No respiratory distress. She has no wheezes. She has no rales.    Abdominal: Soft. Bowel sounds are normal. There is no tenderness. There is no rebound and no guarding.  Musculoskeletal: Normal range of motion. She exhibits edema (Trace BLE edema).  Lymphadenopathy:    She has no cervical adenopathy.       Right: No supraclavicular adenopathy present.       Left: No supraclavicular  adenopathy present.  Neurological: She is alert and oriented to person, place, and time. No cranial nerve deficit.  Skin: Skin is warm and dry. No rash noted.  Psychiatric: Mood, memory, affect and judgment normal.  Nursing note and vitals reviewed.    LABORATORY DATA:  I have reviewed the labs as listed.  CBC    Component Value Date/Time   WBC 6.1 08/07/2016 1236   RBC 4.27 08/07/2016 1236   HGB 12.0 08/07/2016 1236   HCT 37.3 08/07/2016 1236   PLT  08/07/2016 1236    PLATELET CLUMPS NOTED ON SMEAR, COUNT APPEARS ADEQUATE   MCV 87.4 08/07/2016 1236   MCH 28.1 08/07/2016 1236   MCHC 32.2 08/07/2016 1236   RDW 14.5 08/07/2016 1236   LYMPHSABS 1.7 08/07/2016 1236   MONOABS 0.3 08/07/2016 1236   EOSABS 0.2 08/07/2016 1236   BASOSABS 0.0 08/07/2016 1236   CMP Latest Ref Rng & Units 08/07/2016 03/18/2016 02/07/2016  Glucose 65 - 99 mg/dL 155(H) 151(H) 137(H)  BUN 6 - 20 mg/dL '15 8 14  '$ Creatinine 0.44 - 1.00 mg/dL 0.72 0.70 0.69  Sodium 135 - 145 mmol/L 137 136 138  Potassium 3.5 - 5.1 mmol/L 4.1 4.9 3.7  Chloride 101 - 111 mmol/L 100(L) 99(L)  102  CO2 22 - 32 mmol/L '26 27 29  '$ Calcium 8.9 - 10.3 mg/dL 9.6 9.5 9.0  Total Protein 6.5 - 8.1 g/dL 8.5(H) - 7.7  Total Bilirubin 0.3 - 1.2 mg/dL 0.6 - 0.5  Alkaline Phos 38 - 126 U/L 117 - 112  AST 15 - 41 U/L 26 - 25  ALT 14 - 54 U/L 28 - 28    PENDING LABS:    DIAGNOSTIC IMAGING:  *The following radiologic images and reports have been reviewed independently and agree with below findings.  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 Treated with left lumpectomy, followed by adjuvant chemo with Abraxane/Herceptin. Went on to complete adjuvant radiation therapy on 06/28/15. Started anti-estrogen therapy with Arimidex in 07/2015. Completed maintenance Herceptin  on 02/07/16.  -Last mammogram 11/06/15 negative; annual mammogram due in 11/2016 and is already scheduled for 11/11/16.  -Clinical breast exam with (L) breast/areola erythema. Patient states this is secondary to her bar. Encouraged her to contact us if erythema does not resolve, as we will order diagnostic mammogram of (L) breast if needed. Otherwise, negative breast exam today. (although exam partially limited given patient's inability to get onto exam table).  -Continue Arimidex for a total of 5-10 years. She has some arthralgias and hot flashes, but they are no worse.  -Return to cancer center in 6 months for continued surveillance.   Bone health:  -Last DEXA scan 06/28/15 was normal, with T-score -0.1. She understands that aromatase inhibitor therapy can weaken bone density over time.  -Biennial DEXA scan will be due in 06/2016.  -Continue calcium/vitamin D supplementation; increase weight-bearing exercises as tolerated.   Health maintenance/Wellness promotion:  -Encouraged continued follow-up with PCP and other specialists to manage comorbid conditions.   Dispo:  -Return to cancer center in 6 months for continued surveillance.    All questions were answered to patient's stated satisfaction. Encouraged patient to call with any new concerns or questions before her next visit to the cancer center and we can certain see her sooner, if needed.    Plan of care discussed with Dr. Talbert Cage, who agrees with the above aforementioned.    Orders placed this encounter:  No orders of the defined types were placed in this encounter.     Mike Craze, NP [place] [phone]

## 2016-10-30 ENCOUNTER — Other Ambulatory Visit (HOSPITAL_COMMUNITY): Payer: Self-pay | Admitting: Oncology

## 2016-10-30 DIAGNOSIS — Z9889 Other specified postprocedural states: Secondary | ICD-10-CM

## 2016-11-11 ENCOUNTER — Ambulatory Visit (HOSPITAL_COMMUNITY)
Admission: RE | Admit: 2016-11-11 | Discharge: 2016-11-11 | Disposition: A | Discharge status: Home / Self Care | Payer: Medicare Other | Source: Ambulatory Visit | Attending: Oncology | Admitting: Oncology

## 2016-11-11 DIAGNOSIS — C50412 Malignant neoplasm of upper-outer quadrant of left female breast: Secondary | ICD-10-CM | POA: Diagnosis not present

## 2016-11-11 DIAGNOSIS — Z170 Estrogen receptor positive status [ER+]: Secondary | ICD-10-CM | POA: Diagnosis not present

## 2016-12-01 ENCOUNTER — Other Ambulatory Visit (HOSPITAL_COMMUNITY): Payer: Self-pay | Admitting: Adult Health

## 2016-12-01 DIAGNOSIS — C50412 Malignant neoplasm of upper-outer quadrant of left female breast: Secondary | ICD-10-CM

## 2016-12-22 ENCOUNTER — Emergency Department (HOSPITAL_COMMUNITY)
Admission: EM | Admit: 2016-12-22 | Discharge: 2016-12-22 | Disposition: A | Discharge status: Home / Self Care | Payer: Medicare Other | Attending: Emergency Medicine | Admitting: Emergency Medicine

## 2016-12-22 ENCOUNTER — Encounter (HOSPITAL_COMMUNITY): Payer: Self-pay | Admitting: *Deleted

## 2016-12-22 ENCOUNTER — Telehealth (HOSPITAL_COMMUNITY): Payer: Self-pay | Admitting: Emergency Medicine

## 2016-12-22 DIAGNOSIS — Z7984 Long term (current) use of oral hypoglycemic drugs: Secondary | ICD-10-CM | POA: Diagnosis not present

## 2016-12-22 DIAGNOSIS — Z7982 Long term (current) use of aspirin: Secondary | ICD-10-CM | POA: Insufficient documentation

## 2016-12-22 DIAGNOSIS — Z79899 Other long term (current) drug therapy: Secondary | ICD-10-CM | POA: Insufficient documentation

## 2016-12-22 DIAGNOSIS — J449 Chronic obstructive pulmonary disease, unspecified: Secondary | ICD-10-CM | POA: Diagnosis not present

## 2016-12-22 DIAGNOSIS — M542 Cervicalgia: Secondary | ICD-10-CM | POA: Insufficient documentation

## 2016-12-22 DIAGNOSIS — Z87891 Personal history of nicotine dependence: Secondary | ICD-10-CM | POA: Diagnosis not present

## 2016-12-22 DIAGNOSIS — I10 Essential (primary) hypertension: Secondary | ICD-10-CM | POA: Insufficient documentation

## 2016-12-22 DIAGNOSIS — E119 Type 2 diabetes mellitus without complications: Secondary | ICD-10-CM | POA: Diagnosis not present

## 2016-12-22 MED ORDER — HYDROMORPHONE HCL 1 MG/ML IJ SOLN
1.0000 mg | Freq: Once | INTRAMUSCULAR | Status: AC
  Administered 2016-12-22: 1 mg via INTRAMUSCULAR
  Filled 2016-12-22: qty 1

## 2016-12-22 MED ORDER — DEXAMETHASONE 4 MG PO TABS
8.0000 mg | ORAL_TABLET | Freq: Once | ORAL | Status: AC
  Administered 2016-12-22: 8 mg via ORAL
  Filled 2016-12-22: qty 2

## 2016-12-22 MED ORDER — HYDROCODONE-ACETAMINOPHEN 5-325 MG PO TABS: 1.0000 | ORAL_TABLET | Freq: Four times a day (QID) | ORAL | 0 refills | Status: DC | PRN

## 2016-12-22 MED ORDER — KETOROLAC TROMETHAMINE 30 MG/ML IJ SOLN
30.0000 mg | Freq: Once | INTRAMUSCULAR | Status: AC
  Administered 2016-12-22: 30 mg via INTRAMUSCULAR
  Filled 2016-12-22: qty 1

## 2016-12-22 MED ORDER — MELOXICAM 15 MG PO TABS: 15.0000 mg | ORAL_TABLET | Freq: Every day | ORAL | 0 refills | Status: DC | PRN

## 2016-12-22 MED ORDER — LORAZEPAM 0.5 MG PO TABS
0.5000 mg | ORAL_TABLET | Freq: Once | ORAL | Status: AC
  Administered 2016-12-22: 0.5 mg via ORAL
  Filled 2016-12-22: qty 1

## 2016-12-22 NOTE — ED Notes (Signed)
ED Provider at bedside. 

## 2016-12-22 NOTE — ED Provider Notes (Signed)
[facility] EMERGENCY DEPARTMENT Provider Note   CSN: [id number] Arrival date & time: 12/22/16  1126     History   Chief Complaint Chief Complaint  Patient presents with  . Torticollis    HPI Kristy Harris is a 71 y.o. female.  HPI   71 year old female with neck pain.  Onset Wednesday.  Denies any trauma.  She cannot remember what she was doing specifically when she first noticed it.  She is very sedentary.  She denies any significant change in activity, overuse or strain.  Neck pain is bilateral.  It hurts at rest and is exacerbated by any type of movement.  The pain extends up the back of her head.  Reports baseline neuropathy in her hands.  Denies any acute change in this.  No focal weakness.  No fevers or chills.  No sore throat.  Has been taking Tylenol and using Aspercreme without much improvement.  Past Medical History:  Diagnosis Date  . Anemia    takes iron supplement  . Arthritis    knees  . COPD (chronic obstructive pulmonary disease) ([place])   . Enlarged heart   . GERD (gastroesophageal reflux disease)   . Hidradenitis suppurativa    buttocks  . History of breast cancer 12/2014  . Hyperlipidemia   . Hypertension    states under control with meds., has been on med. > 40 yrs.  . Morbid obesity ([place])   . Non-insulin dependent type 2 diabetes mellitus ([place])   . Shortness of breath dyspnea    with exertion  . Urinary frequency     Patient Active Problem List   Diagnosis Date Noted  . Genetic testing 01/23/2015  . Family history of breast cancer 01/09/2015  . Encounter for pre-operative cardiovascular clearance 11/23/2014  . Aortic stenosis 11/23/2014  . Normal coronary arteries 11/23/2014  . Morbid obesity ([place]) 11/23/2014  . DJD (degenerative joint disease) 11/23/2014  . Breast cancer of upper-outer quadrant of left female breast ([place]) 11/13/2014  . Diverticulosis of colon with hemorrhage   . H. pylori infection 05/28/2014  . Abscess of skin and  subcutaneous tissue 12/08/2013  . Iron deficiency anemia 09/19/2013  . Abscess of left axilla 08/11/2013  . Abscess of left thigh 05/26/2013  . Abscess of buttock, right 06/13/2011  . Arteritis ([place]) 02/27/2011  . Abnormal cardiovascular function study 02/15/2011  . Peripheral vascular disease ([place]) 02/15/2011  . Diabetes mellitus ([place]) 02/15/2011  . Dyspnea 01/24/2011  . Hypertension 01/24/2011  . Hyperlipidemia 01/24/2011  . Obesity 01/24/2011  . Bruit 01/24/2011  . Murmur 01/24/2011    Past Surgical History:  Procedure Laterality Date  . ABDOMINAL HYSTERECTOMY  25 yrs ago   partial  . BLADDER SUSPENSION     x 2  . BREAST LUMPECTOMY WITH RADIOACTIVE SEED AND SENTINEL LYMPH NODE BIOPSY Left 12/25/2014   Procedure: RADIOACTIVE SEED GIUDED LEFT BREAST LUMPECTOMY, LEFT AXILLARY SENTINEL LYMPH NODE BIOPSY;  Surgeon: Alphonsa Overall, MD;  Location: [facility];  Service: General;  Laterality: Left;  . CARDIAC CATHETERIZATION  02/21/2011   Normal coronary arteries, normal EF  . COLONOSCOPY WITH PROPOFOL  11/18/2013  . ESOPHAGOGASTRODUODENOSCOPY (EGD) WITH PROPOFOL  11/18/2013  . EXCISION HYDRADENITIS LABIA N/A 12/08/2013   Procedure: EXCISION HIDRADENITIS PUBIC AREA;  Surgeon: Alphonsa Overall, MD;  Location: [facility];  Service: General;  Laterality: N/A;  . HYDRADENITIS EXCISION Left 12/08/2013   Procedure: EXCISION HIDRADENITIS AXILLA;  Surgeon: Alphonsa Overall, MD;  Location: [facility];  Service: General;  Laterality:  Left;  . INCISION AND DRAINAGE ABSCESS  08/17/2008   perineum and buttock  . IRRIGATION AND DEBRIDEMENT ABSCESS Right 12/23/2012   Procedure: incision  AND DEBRIDEMENT right buttock infection ;  Surgeon: Shann Medal, MD;  Location: [facility];  Service: General;  Laterality: Right;  . PILONIDAL CYST / SINUS EXCISION  06/04/2004  . PILONIDAL CYST EXCISION    . PORT-A-CATH REMOVAL Right 03/21/2016   Procedure: MINOR REMOVAL PORT-A-CATH;  Surgeon: Alphonsa Overall, MD;  Location: [facility];  Service: General;  Laterality: Right;  MINOR REMOVAL PORT-A-CATH  . PORTACATH PLACEMENT N/A 01/12/2015   Procedure: INSERTION PORT-A-CATH;  Surgeon: Alphonsa Overall, MD;  Location: [facility];  Service: General;  Laterality: N/A;  . TONSILLECTOMY      OB History    No data available       Home Medications    Prior to Admission medications   Medication Sig Start Date End Date Taking? Authorizing Provider  acetaminophen (TYLENOL) 500 MG tablet Take 1,000 mg by mouth every 6 (six) hours as needed for mild pain or headache.   Yes [provider]  albuterol (PROVENTIL HFA;VENTOLIN HFA) 108 (90 BASE) MCG/ACT inhaler Inhale 2 puffs into the lungs every 4 (four) hours as needed for shortness of breath.   Yes [provider]  anastrozole (ARIMIDEX) 1 MG tablet TAKE 1 TABLET (1 MG TOTAL) BY MOUTH DAILY. 12/02/16  Yes Holley Bouche, NP  aspirin EC 81 MG tablet Take 81 mg by mouth daily.   Yes [provider]  atorvastatin (LIPITOR) 20 MG tablet Take 20 mg by mouth every morning.    Yes [provider]  cholecalciferol (VITAMIN D) 1000 units tablet Take 2,000 Units by mouth daily.   Yes [provider]  ferrous sulfate 325 (65 FE) MG tablet Take 325 mg by mouth 3 (three) times daily with meals.   Yes [provider]  furosemide (LASIX) 20 MG tablet Take 20 mg by mouth 3 (three) times a week.   Yes [provider]  KLOR-CON M20 20 MEQ tablet TAKE 1 TABLET (20 MEQ TOTAL) BY MOUTH DAILY. 05/23/16  Yes Kefalas, Manon Hilding, PA-C  lidocaine-prilocaine (EMLA) cream APPLY A QUARTER SIXE AMOUNT TO PORT SITE 1 HOUR PRIO TO CHEMO. DO NOT RUB IN. COVER W/ PLASTIC WRAP 02/04/16  Yes [provider]  losartan (COZAAR) 100 MG tablet Take 100 mg by mouth daily.   Yes [provider]  meloxicam (MOBIC) 7.5 MG tablet Take 1 tablet by mouth daily. 11/18/16  Yes [provider]  metFORMIN (GLUCOPHAGE-XR) 500 MG 24 hr  tablet TAKE ONE TABLET BY MOUTH TWICE DAILY 03/24/16  Yes [provider]  metoprolol succinate (TOPROL-XL) 100 MG 24 hr tablet TAKE ONE (1) TABLET EACH DAY 10/25/15  Yes [provider]  pantoprazole (PROTONIX) 40 MG tablet TAKE 1 TABLET (40 MG TOTAL) BY MOUTH DAILY. 02/06/15  Yes Nandigam, Venia Minks, MD  polyethylene glycol (MIRALAX / GLYCOLAX) packet Take 17 g by mouth daily as needed for mild constipation. 05/30/14  Yes Robbie Lis, MD  verapamil (CALAN-SR) 120 MG CR tablet Take 120 mg by mouth daily. 12/30/14  Yes [provider]  fluconazole (DIFLUCAN) 100 MG tablet TAKE ONE (1) TABLET EACH DAY 04/22/16   [provider]    Family History Family History  Problem Relation Age of Onset  . Heart attack Mother        had multiple health problems  .  Lung cancer Father 56       smoker  . Prostate cancer Brother   . Lung cancer Brother        dx. 32s; smoker  . Throat cancer Brother        dx. 66s; smoker  . Diabetes Sister        has 3 living sisters with multiple health problems  . Hypertension Son   . Hypertension Daughter   . Breast cancer Sister 14  . Breast cancer Sister        dx. 40s  . Breast cancer Maternal Grandmother        dx. 56s  . Breast cancer Maternal Aunt        dx. older than 1  . Dementia Maternal Aunt   . Breast cancer Cousin        dx. 48s  . Hypertension Brother     Social History Social History  Substance Use Topics  . Smoking status: Former Smoker    Years: 0.00    Quit date: 03/03/1994  . Smokeless tobacco: Never Used  . Alcohol use No     Allergies   Lisinopril and Penicillins   Review of Systems Review of Systems  All systems reviewed and negative, other than as noted in HPI.  Physical Exam Updated Vital Signs BP (!) 153/81   Pulse 83   Temp 98.5 F (36.9 C) (Oral)   Resp 16   Ht 5\' 4"  (1.626 m)   Wt 126.1 kg (278 lb)   SpO2 98%   BMI 47.72 kg/m   Physical Exam  Constitutional: She  appears well-developed and well-nourished. No distress.  Laying in bed.  No acute distress.  Obese.  HENT:  Head: Normocephalic and atraumatic.  Eyes: Pupils are equal, round, and reactive to light. Conjunctivae and EOM are normal. Right eye exhibits no discharge. Left eye exhibits no discharge.  Neck:  Exam is somewhat limited by body habitus, but no significant deformity noted.  No concerning skin lesions.  Tenderness of posterior neck musculature extending into the occiput.  Winces in pain with passive flexion and rotation either side.  She is able to shrug her shoulders without apparent difficulty.  Strength is 5 out of 5 bilateral upper extremities.  Sensation is intact to light touch.  Cardiovascular: Normal rate, regular rhythm and normal heart sounds.  Exam reveals no gallop and no friction rub.   No murmur heard. Pulmonary/Chest: Effort normal and breath sounds normal. No respiratory distress.  Abdominal: Soft. She exhibits no distension. There is no tenderness.  Musculoskeletal: She exhibits no edema or tenderness.  Neurological: She is alert.  Skin: Skin is warm and dry.  Psychiatric: She has a normal mood and affect. Her behavior is normal. Thought content normal.  Nursing note and vitals reviewed.    ED Treatments / Results  Labs (all labs ordered are listed, but only abnormal results are displayed) Labs Reviewed - No data to display  EKG  EKG Interpretation None       Radiology No results found.  Procedures Procedures (including critical care time)  Medications Ordered in ED Medications - No data to display   Initial Impression / Assessment and Plan / ED Course  I have reviewed the triage vital signs and the nursing notes.  Pertinent labs & imaging results that were available during my care of the patient were reviewed by me and considered in my medical decision making (see chart for details).  71 year old female with atraumatic neck pain.  I suspect  that this is muscular in nature.  Very reproducible with palpation and range of motion.  She is afebrile.  No acute neurologic complaints and nonfocal neurologic examination.  Plan symptomatic treatment.  Return precautions were discussed.  Doubt emergent etiologies such as meningitis, carotid or vertebral artery dissection, etc  Final Clinical Impressions(s) / ED Diagnoses   Final diagnoses:  Neck pain    New Prescriptions New Prescriptions   No medications on file     Virgel Manifold, MD 12/22/16 1333

## 2016-12-22 NOTE — ED Triage Notes (Signed)
Pt c/o neck stiffness, headache and shoulder pain x 1 week. Denies injury. Pt has used Tylenol at home with no relief.

## 2016-12-22 NOTE — Telephone Encounter (Signed)
Pt called and stated that she has been having a stiff neck and headache that feels like her head is "splitting" for about a week./  She has called her PCP but he wanted her to call her oncologist.  She has used tylenol, muscle rub cream, and heating pad with little relief.  Spoke with Dr Talbert Cage and she would like the pt to go the the ER to be evaluated.  Pt verbalized understanding.

## 2017-02-05 ENCOUNTER — Other Ambulatory Visit (HOSPITAL_COMMUNITY): Payer: Self-pay | Admitting: *Deleted

## 2017-02-05 DIAGNOSIS — Z170 Estrogen receptor positive status [ER+]: Principal | ICD-10-CM

## 2017-02-05 DIAGNOSIS — C50412 Malignant neoplasm of upper-outer quadrant of left female breast: Secondary | ICD-10-CM

## 2017-02-06 ENCOUNTER — Encounter (HOSPITAL_COMMUNITY): Payer: Medicare Other | Attending: Adult Health

## 2017-02-06 ENCOUNTER — Encounter (HOSPITAL_COMMUNITY): Payer: Self-pay | Admitting: Oncology

## 2017-02-06 ENCOUNTER — Encounter (HOSPITAL_BASED_OUTPATIENT_CLINIC_OR_DEPARTMENT_OTHER): Payer: Medicare Other | Admitting: Oncology

## 2017-02-06 VITALS — BP 148/84 | HR 75 | Resp 20 | Wt 290.0 lb

## 2017-02-06 DIAGNOSIS — R350 Frequency of micturition: Secondary | ICD-10-CM | POA: Insufficient documentation

## 2017-02-06 DIAGNOSIS — G8929 Other chronic pain: Secondary | ICD-10-CM | POA: Diagnosis not present

## 2017-02-06 DIAGNOSIS — Z79811 Long term (current) use of aromatase inhibitors: Secondary | ICD-10-CM | POA: Diagnosis not present

## 2017-02-06 DIAGNOSIS — C50412 Malignant neoplasm of upper-outer quadrant of left female breast: Secondary | ICD-10-CM | POA: Diagnosis present

## 2017-02-06 DIAGNOSIS — Z87891 Personal history of nicotine dependence: Secondary | ICD-10-CM | POA: Diagnosis not present

## 2017-02-06 DIAGNOSIS — M2550 Pain in unspecified joint: Secondary | ICD-10-CM | POA: Diagnosis not present

## 2017-02-06 DIAGNOSIS — J449 Chronic obstructive pulmonary disease, unspecified: Secondary | ICD-10-CM | POA: Insufficient documentation

## 2017-02-06 DIAGNOSIS — I10 Essential (primary) hypertension: Secondary | ICD-10-CM | POA: Insufficient documentation

## 2017-02-06 DIAGNOSIS — Z79899 Other long term (current) drug therapy: Secondary | ICD-10-CM | POA: Diagnosis not present

## 2017-02-06 DIAGNOSIS — Z170 Estrogen receptor positive status [ER+]: Secondary | ICD-10-CM | POA: Insufficient documentation

## 2017-02-06 DIAGNOSIS — M25562 Pain in left knee: Secondary | ICD-10-CM

## 2017-02-06 DIAGNOSIS — K219 Gastro-esophageal reflux disease without esophagitis: Secondary | ICD-10-CM | POA: Diagnosis not present

## 2017-02-06 DIAGNOSIS — E785 Hyperlipidemia, unspecified: Secondary | ICD-10-CM | POA: Diagnosis not present

## 2017-02-06 DIAGNOSIS — M25561 Pain in right knee: Secondary | ICD-10-CM | POA: Diagnosis not present

## 2017-02-06 LAB — CBC WITH DIFFERENTIAL/PLATELET
BASOS PCT: 0 %
Basophils Absolute: 0 10*3/uL (ref 0.0–0.1)
Eosinophils Absolute: 0.2 10*3/uL (ref 0.0–0.7)
Eosinophils Relative: 2 %
HEMATOCRIT: 38.9 % (ref 36.0–46.0)
HEMOGLOBIN: 11.9 g/dL — AB (ref 12.0–15.0)
LYMPHS ABS: 2.3 10*3/uL (ref 0.7–4.0)
LYMPHS PCT: 32 %
MCH: 27.7 pg (ref 26.0–34.0)
MCHC: 30.6 g/dL (ref 30.0–36.0)
MCV: 90.7 fL (ref 78.0–100.0)
MONO ABS: 0.3 10*3/uL (ref 0.1–1.0)
MONOS PCT: 5 %
NEUTROS ABS: 4.2 10*3/uL (ref 1.7–7.7)
Neutrophils Relative %: 61 %
Platelets: 209 10*3/uL (ref 150–400)
RBC: 4.29 MIL/uL (ref 3.87–5.11)
RDW: 14.7 % (ref 11.5–15.5)
WBC: 7 10*3/uL (ref 4.0–10.5)

## 2017-02-06 LAB — COMPREHENSIVE METABOLIC PANEL
ALK PHOS: 122 U/L (ref 38–126)
ALT: 23 U/L (ref 14–54)
ANION GAP: 10 (ref 5–15)
AST: 23 U/L (ref 15–41)
Albumin: 3.9 g/dL (ref 3.5–5.0)
BILIRUBIN TOTAL: 0.8 mg/dL (ref 0.3–1.2)
BUN: 20 mg/dL (ref 6–20)
CALCIUM: 9.7 mg/dL (ref 8.9–10.3)
CO2: 29 mmol/L (ref 22–32)
Chloride: 101 mmol/L (ref 101–111)
Creatinine, Ser: 0.98 mg/dL (ref 0.44–1.00)
GFR calc non Af Amer: 58 mL/min — ABNORMAL LOW (ref >60)
Glucose, Bld: 158 mg/dL — ABNORMAL HIGH (ref 65–99)
POTASSIUM: 4.1 mmol/L (ref 3.5–5.1)
Sodium: 140 mmol/L (ref 135–145)
TOTAL PROTEIN: 8 g/dL (ref 6.5–8.1)

## 2017-02-06 NOTE — Patient Instructions (Signed)
[facility]  Discharge Instructions:  You were seen by Dr. Zhou today. _______________________________________________________________  Thank you for choosing [facility] to provide your oncology and hematology care.  To afford each patient quality time with our providers, please arrive at least 15 minutes before your scheduled appointment.  You need to re-schedule your appointment if you arrive 10 or more minutes late.  We strive to give you quality time with our providers, and arriving late affects you and other patients whose appointments are after yours.  Also, if you no show three or more times for appointments you may be dismissed from the clinic.  Again, thank you for choosing [facility]. Our hope is that these requests will allow you access to exceptional care and in a timely manner. _______________________________________________________________  If you have questions after your visit, please contact our office at [phone] between the hours of 8:30 a.m. and 5:00 p.m. Voicemails left after 4:30 p.m. will not be returned until the following business day. _______________________________________________________________  For prescription refill requests, have your pharmacy contact our office. _______________________________________________________________  Recommendations made by the consultant and any test results will be sent to your referring physician. _______________________________________________________________ 

## 2017-02-06 NOTE — Progress Notes (Signed)
[place], [place] [address]   CLINIC:  Medical Oncology/Hematology  PCP:  Dione Housekeeper, MD [address] 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+  CURRENT THERAPY: Arimidex daily    BRIEF ONCOLOGIC HISTORY:    Breast cancer of upper-outer quadrant of left female breast ([place])   11/01/2014 Mammogram    Possible mass in the left breast upper outer quadrant measuring 13 mm suspicious for breast cancer confirmed through ultrasound a spiculated hypoechoic mass ill-defined, no enlarged lymph nodes      11/01/2014 Initial Diagnosis    Invasive ductal carcinoma, moderately differentiated, ER > 90%, PR> 90%, HER-2 -2+ by IHC, ratio 1.15, KI 67: 29%, T1 cN0 stage IA clinical stage      11/24/2014 Echocardiogram    Systolic function was normal. The estimated ejection fraction was in the range of 55% to 60%.       12/25/2014 Surgery    Left lumpectomy: IDC 1.7 cm, positive for LVI, with DCIS, 1/1 sentinel node positive deposit 1.9 cm with extracapsular extension, ER 90%, PR 90%, HER-2 positive ratio 2.4, Ki-67 29% T1 cN1 stage II a      02/01/2015 - 04/19/2015 Chemotherapy    Abraxane/Herceptin      03/20/2015 Echocardiogram    Systolic function wasnormal. The estimated ejection fraction was in the range of 60%to 65%.      05/08/2015 - 06/22/2015 Radiation Therapy    Dr. Lisbeth Renshaw       05/10/2015 - 02/07/2016 Antibody Plan    Herceptin every 21 days to complete 52 weeks worth of therapy.      06/13/2015 Echocardiogram    2D echo- The estimated ejection fraction was in the range of 60% to 65%. Diastolic function is abnormal, indeterminate grade. Wallmotion was normal.      06/28/2015 Imaging    Bone density- BMD as determined from Femur Total Left is 0.994 g/cm2 with a T-Score of -0.1. This patient is considered normal according to [facility]) criteria.       07/13/2015 -  Anti-estrogen oral therapy    Arimidex daily      09/12/2015 Echocardiogram    The estimated ejection fraction was in the range of 60% to 65%. Wall motion was normal; there were no regional wall motion abnormalities. Doppler parameters are consistent with abnormal L ventricular relaxation (grade 1 diastolic dysfunction).      12/04/2015 Echocardiogram    Left ventricle: The cavity size was normal. Wall thickness was   increased increased in a pattern of mild to moderate LVH.   Systolic function was normal. The estimated ejection fraction was   in the range of 60% to 65%. Wall motion was normal; there were no   regional wall motion abnormalities. Features are consistent with   a pseudonormal left ventricular filling pattern, with concomitant   abnormal relaxation and increased filling pressure (grade 2   diastolic dysfunction). Doppler parameters are consistent with   high ventricular filling pressure.      03/21/2016 Procedure    Port removed by Dr. Lucia Gaskins         INTERVAL HISTORY:  Ms. Fader 71 y.o. female presents to cancer center for routine follow-up for history of Stage II left breast cancer.   She is here today with her daughter; she is seen seated in wheelchair. She had a bilateral diagnostic mammogram with  TOMO on 11/11/2016 which was negative for malignancy in either breast.  She states that overall all she feels well except for her chronic arthritic pains.  She has chronic bilateral knee pain.  She has been taking Arimidex daily and tolerating it well.  She denies any chest pain, shortness of breath, abdominal pain, nausea, vomiting, diarrhea, unexplained weight loss, new worsening bone pain.   REVIEW OF SYSTEMS:  Review of Systems  Constitutional: Positive for fatigue.  HENT:  Negative.   Eyes: Negative.   Respiratory: Negative.   Cardiovascular: Negative.   Gastrointestinal: Negative.  Negative for abdominal pain,  constipation, diarrhea, nausea and vomiting.  Genitourinary: Negative for bladder incontinence, dysuria, hematuria and vaginal bleeding.   Musculoskeletal: Positive for arthralgias. Negative for back pain.  Neurological: Negative for dizziness, headaches and numbness.  Hematological: Negative.   Psychiatric/Behavioral: Negative.      PAST MEDICAL/SURGICAL HISTORY:  Past Medical History:  Diagnosis Date  . Anemia    takes iron supplement  . Arthritis    knees  . COPD (chronic obstructive pulmonary disease) ([place])   . Enlarged heart   . GERD (gastroesophageal reflux disease)   . Hidradenitis suppurativa    buttocks  . History of breast cancer 12/2014  . Hyperlipidemia   . Hypertension    states under control with meds., has been on med. > 40 yrs.  . Morbid obesity ([place])   . Non-insulin dependent type 2 diabetes mellitus ([place])   . Shortness of breath dyspnea    with exertion  . Urinary frequency    Past Surgical History:  Procedure Laterality Date  . ABDOMINAL HYSTERECTOMY  25 yrs ago   partial  . BLADDER SUSPENSION     x 2  . BREAST LUMPECTOMY WITH RADIOACTIVE SEED AND SENTINEL LYMPH NODE BIOPSY Left 12/25/2014   Procedure: RADIOACTIVE SEED GIUDED LEFT BREAST LUMPECTOMY, LEFT AXILLARY SENTINEL LYMPH NODE BIOPSY;  Surgeon: Alphonsa Overall, MD;  Location: [facility];  Service: General;  Laterality: Left;  . CARDIAC CATHETERIZATION  02/21/2011   Normal coronary arteries, normal EF  . COLONOSCOPY WITH PROPOFOL  11/18/2013  . ESOPHAGOGASTRODUODENOSCOPY (EGD) WITH PROPOFOL  11/18/2013  . EXCISION HYDRADENITIS LABIA N/A 12/08/2013   Procedure: EXCISION HIDRADENITIS PUBIC AREA;  Surgeon: Alphonsa Overall, MD;  Location: [facility];  Service: General;  Laterality: N/A;  . HYDRADENITIS EXCISION Left 12/08/2013   Procedure: EXCISION HIDRADENITIS AXILLA;  Surgeon: Alphonsa Overall, MD;  Location: [facility];  Service: General;  Laterality: Left;  . INCISION AND DRAINAGE ABSCESS  08/17/2008   perineum and  buttock  . IRRIGATION AND DEBRIDEMENT ABSCESS Right 12/23/2012   Procedure: incision  AND DEBRIDEMENT right buttock infection ;  Surgeon: Shann Medal, MD;  Location: [facility];  Service: General;  Laterality: Right;  . PILONIDAL CYST / SINUS EXCISION  06/04/2004  . PILONIDAL CYST EXCISION    . PORT-A-CATH REMOVAL Right 03/21/2016   Procedure: MINOR REMOVAL PORT-A-CATH;  Surgeon: Alphonsa Overall, MD;  Location: [facility];  Service: General;  Laterality: Right;  MINOR REMOVAL PORT-A-CATH  . PORTACATH PLACEMENT N/A 01/12/2015   Procedure: INSERTION PORT-A-CATH;  Surgeon: Alphonsa Overall, MD;  Location: [facility];  Service: General;  Laterality: N/A;  . TONSILLECTOMY       SOCIAL HISTORY:  Social History   Socioeconomic History  . Marital status: Widowed    Spouse name: Not on file  . Number of children: 5  . Years of education: Not on file  . Highest education level: Not  on file  Social Needs  . Financial resource strain: Not on file  . Food insecurity - worry: Not on file  . Food insecurity - inability: Not on file  . Transportation needs - medical: Not on file  . Transportation needs - non-medical: Not on file  Occupational History  . Occupation: Textile work    Comment: Disabled  Tobacco Use  . Smoking status: Former Smoker    Years: 0.00    Last attempt to quit: 03/03/1994    Years since quitting: 22.9  . Smokeless tobacco: Never Used  Substance and Sexual Activity  . Alcohol use: No  . Drug use: No  . Sexual activity: Not on file  Other Topics Concern  . Not on file  Social History Narrative   Lives with grandson.  Widowed.  Has 2 sons and 3 daughters    FAMILY HISTORY:  Family History  Problem Relation Age of Onset  . Heart attack Mother        had multiple health problems  . Lung cancer Father 43       smoker  . Prostate cancer Brother   . Lung cancer Brother        dx. 58s; smoker  . Throat cancer Brother        dx. 96s; smoker  . Diabetes Sister          has 3 living sisters with multiple health problems  . Hypertension Son   . Hypertension Daughter   . Breast cancer Sister 76  . Breast cancer Sister        dx. 14s  . Breast cancer Maternal Grandmother        dx. 45s  . Breast cancer Maternal Aunt        dx. older than 92  . Dementia Maternal Aunt   . Breast cancer Cousin        dx. 4s  . Hypertension Brother     CURRENT MEDICATIONS:  Outpatient Encounter Medications as of 02/06/2017  Medication Sig Note  . acetaminophen (TYLENOL) 500 MG tablet Take 1,000 mg by mouth every 6 (six) hours as needed for mild pain or headache.   . albuterol (PROVENTIL HFA;VENTOLIN HFA) 108 (90 BASE) MCG/ACT inhaler Inhale 2 puffs into the lungs every 4 (four) hours as needed for shortness of breath.   . anastrozole (ARIMIDEX) 1 MG tablet TAKE 1 TABLET (1 MG TOTAL) BY MOUTH DAILY.   Marland Kitchen aspirin EC 81 MG tablet Take 81 mg by mouth daily.   Marland Kitchen atorvastatin (LIPITOR) 20 MG tablet Take 20 mg by mouth every morning.    . cholecalciferol (VITAMIN D) 1000 units tablet Take 2,000 Units by mouth daily.   . ferrous sulfate 325 (65 FE) MG tablet Take 325 mg by mouth 3 (three) times daily with meals.   . fluconazole (DIFLUCAN) 100 MG tablet TAKE ONE (1) TABLET EACH DAY   . furosemide (LASIX) 20 MG tablet Take 20 mg by mouth 3 (three) times a week.   Marland Kitchen HYDROcodone-acetaminophen (NORCO/VICODIN) 5-325 MG tablet Take 1-2 tablets by mouth every 6 (six) hours as needed.   Marland Kitchen KLOR-CON M20 20 MEQ tablet TAKE 1 TABLET (20 MEQ TOTAL) BY MOUTH DAILY.   Marland Kitchen lidocaine-prilocaine (EMLA) cream APPLY A QUARTER SIXE AMOUNT TO PORT SITE 1 HOUR PRIO TO CHEMO. DO NOT RUB IN. COVER W/ PLASTIC WRAP   . losartan (COZAAR) 100 MG tablet Take 100 mg by mouth daily.   . meloxicam (MOBIC) 15 MG tablet  Take 1 tablet (15 mg total) by mouth daily as needed for pain.   . metFORMIN (GLUCOPHAGE-XR) 500 MG 24 hr tablet TAKE ONE TABLET BY MOUTH TWICE DAILY   . metoprolol succinate (TOPROL-XL) 100 MG  24 hr tablet TAKE ONE (1) TABLET EACH DAY 11/15/2015: Received from: [facility]  . pantoprazole (PROTONIX) 40 MG tablet TAKE 1 TABLET (40 MG TOTAL) BY MOUTH DAILY.   Marland Kitchen polyethylene glycol (MIRALAX / GLYCOLAX) packet Take 17 g by mouth daily as needed for mild constipation.   . verapamil (CALAN-SR) 120 MG CR tablet Take 120 mg by mouth daily.    No facility-administered encounter medications on file as of 02/06/2017.     ALLERGIES:  Allergies  Allergen Reactions  . Lisinopril Swelling and Cough    Mouth swelling  . Penicillins Nausea And Vomiting and Rash     PHYSICAL EXAM:  ECOG Performance status: 2-3 - Symptomatic; requires significant assistance with home health aide and family.   Vitals:   02/06/17 1318  BP: (!) 148/84  Pulse: 75  Resp: 20  SpO2: 96%   Filed Weights   02/06/17 1318  Weight: 290 lb (131.5 kg)    Physical Exam  Constitutional: She is oriented to person, place, and time.  -Chronically-ill appearing female in no acute distress.  -Exam done with patient seated in wheelchair.   HENT:  Head: Normocephalic.  Mouth/Throat: Oropharynx is clear and moist. No oropharyngeal exudate.  Eyes: Conjunctivae are normal. Pupils are equal, round, and reactive to light. No scleral icterus.  Neck: Normal range of motion. Neck supple.  Cardiovascular: Normal rate and regular rhythm.  Pulmonary/Chest: Effort normal and breath sounds normal. No respiratory distress. She has no wheezes. She has no rales.  Abdominal: Soft. Bowel sounds are normal. There is no tenderness. There is no rebound and no guarding.  Musculoskeletal: Normal range of motion. She exhibits edema (Trace BLE edema).  Lymphadenopathy:    She has no cervical adenopathy.       Right: No supraclavicular adenopathy present.       Left: No supraclavicular adenopathy present.  Neurological: She is alert and oriented to person, place, and time. No cranial nerve deficit.  Skin: Skin is warm and dry. No rash  noted.  Psychiatric: Mood, memory, affect and judgment normal.  Nursing note and vitals reviewed.    LABORATORY DATA:  I have reviewed the labs as listed.  CBC    Component Value Date/Time   WBC 7.0 02/06/2017 1243   RBC 4.29 02/06/2017 1243   HGB 11.9 (L) 02/06/2017 1243   HCT 38.9 02/06/2017 1243   PLT 209 02/06/2017 1243   MCV 90.7 02/06/2017 1243   MCH 27.7 02/06/2017 1243   MCHC 30.6 02/06/2017 1243   RDW 14.7 02/06/2017 1243   LYMPHSABS 2.3 02/06/2017 1243   MONOABS 0.3 02/06/2017 1243   EOSABS 0.2 02/06/2017 1243   BASOSABS 0.0 02/06/2017 1243   CMP Latest Ref Rng & Units 02/06/2017 08/07/2016 03/18/2016  Glucose 65 - 99 mg/dL 158(H) 155(H) 151(H)  BUN 6 - 20 mg/dL '20 15 8  '$ Creatinine 0.44 - 1.00 mg/dL 0.98 0.72 0.70  Sodium 135 - 145 mmol/L 140 137 136  Potassium 3.5 - 5.1 mmol/L 4.1 4.1 4.9  Chloride 101 - 111 mmol/L 101 100(L) 99(L)  CO2 22 - 32 mmol/L '29 26 27  '$ Calcium 8.9 - 10.3 mg/dL 9.7 9.6 9.5  Total Protein 6.5 - 8.1 g/dL 8.0 8.5(H) -  Total Bilirubin 0.3 - 1.2  mg/dL 0.8 0.6 -  Alkaline Phos 38 - 126 U/L 122 117 -  AST 15 - 41 U/L 23 26 -  ALT 14 - 54 U/L 23 28 -    PENDING LABS:    DIAGNOSTIC IMAGING:  *The following radiologic images and reports have been reviewed independently and agree with below findings.  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 Treated with left lumpectomy, followed by adjuvant chemo with Abraxane/Herceptin. Went on to complete adjuvant radiation therapy on 06/28/15. Started anti-estrogen therapy with Arimidex in 07/2015. Completed maintenance Herceptin on 02/07/16.  -Continue arimidex, tolerating it well. -Reviewed her bilateral mammogram from September 2018 in detail with the patient today. Repeat annual mammogram in September 2019. -Return to cancer  center in 6 months for continued surveillance. Breast exam on next visit.   Bone health:  -Last DEXA scan 06/28/15 was normal, with T-score -0.1. She understands that aromatase inhibitor therapy can weaken bone density over time.  -I have ordered a repeat DEXA scan in 06/2017.  -Continue calcium/vitamin D supplementation; increase weight-bearing exercises as tolerated.    Dispo:  -Return to cancer center in 6 months for continued surveillance.    All questions were answered to patient's stated satisfaction. Encouraged patient to call with any new concerns or questions before her next visit to the cancer center and we can certain see her sooner, if needed.     Orders placed this encounter:  Orders Placed This Encounter  Procedures  . CBC with Differential  . Comprehensive metabolic panel    Twana First, MD

## 2017-02-12 IMAGING — CT CT ABD-PELV W/ CM
1 of 3 series · 13 of 32 positions shown, 18 images · IV contrast (OMNIPAQUE 300)
Comparison: MRI pelvis 11/09/2012

CLINICAL DATA: Patient with mid abdominal pain and rectal bleeding
for 1 week.

EXAM:
CT ABDOMEN AND PELVIS WITH CONTRAST
TECHNIQUE: Multidetector CT imaging of the abdomen and pelvis was performed
using the standard protocol following bolus administration of
intravenous contrast.
CONTRAST:  50mL OMNIPAQUE IOHEXOL 300 MG/ML SOLN, 100mL OMNIPAQUE
IOHEXOL 300 MG/ML SOLN

[Series 2: abd/pel with · axial · 0.91mm/px · z∈[-590,-230]mm · 13 of 82 slices shown, 18 images]
[im 5/82  soft-tissue]
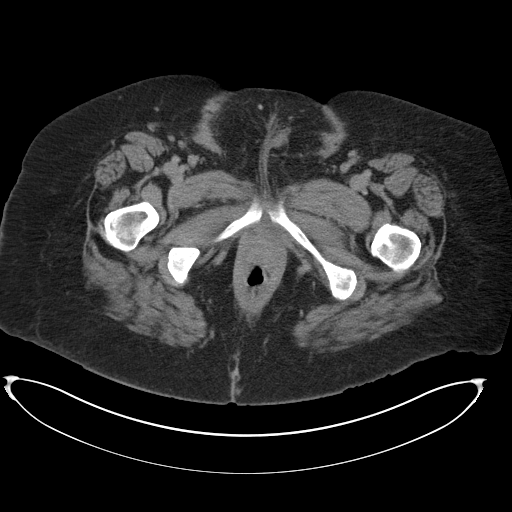
[im 5/82  bone]
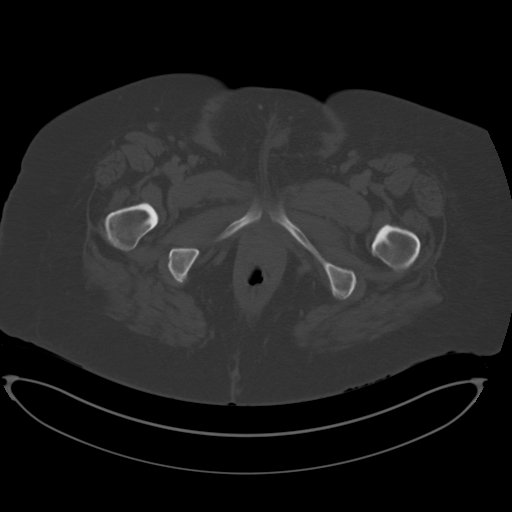
[im 14/82  soft-tissue]
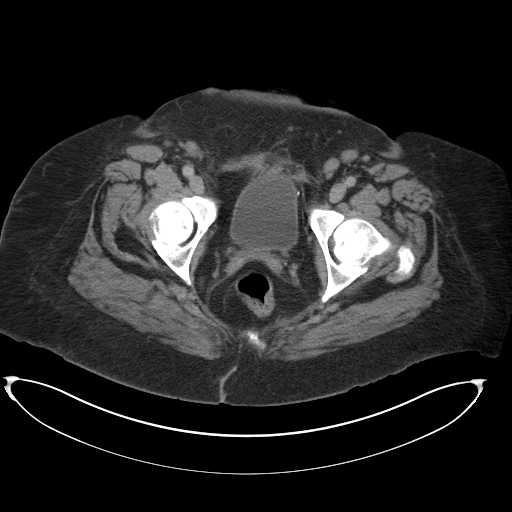
[im 19/82  soft-tissue]
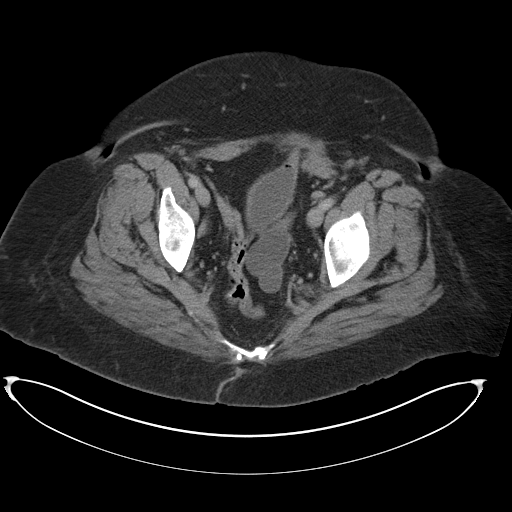
[im 23/82  soft-tissue]
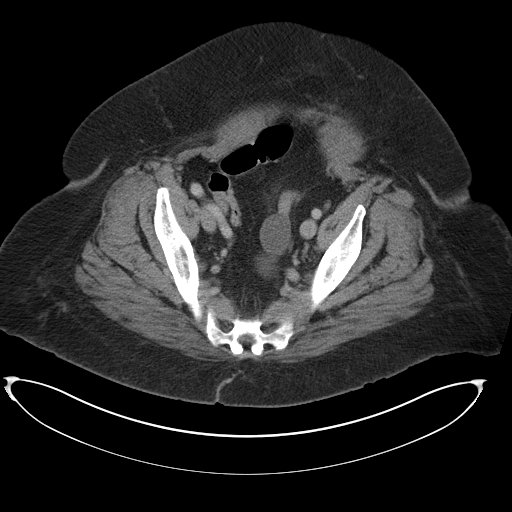
[im 32/82  soft-tissue]
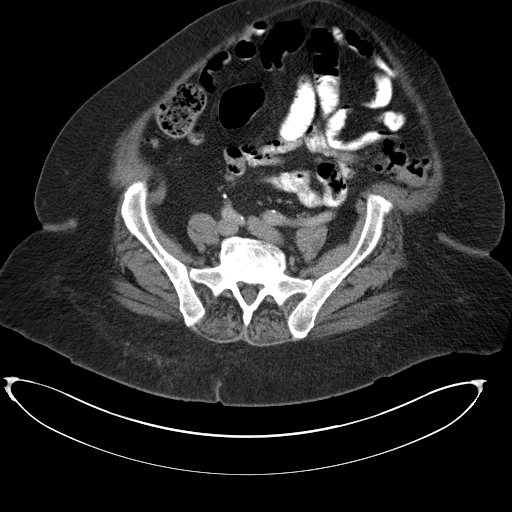
[im 37/82  soft-tissue]
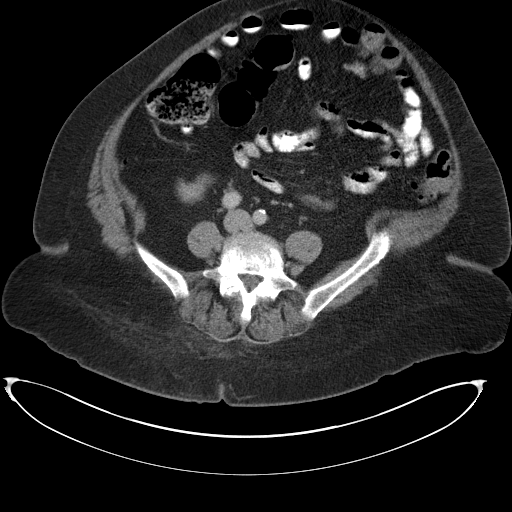
[im 46/82  soft-tissue]
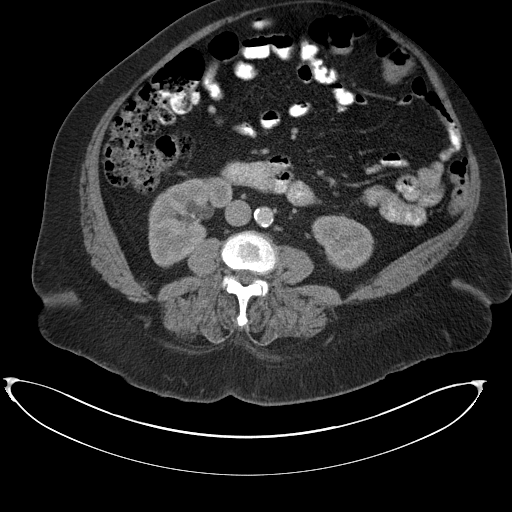
[im 50/82  soft-tissue]
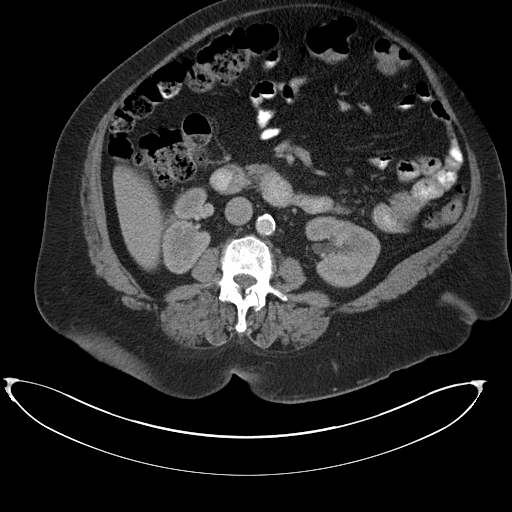
[im 59/82  soft-tissue]
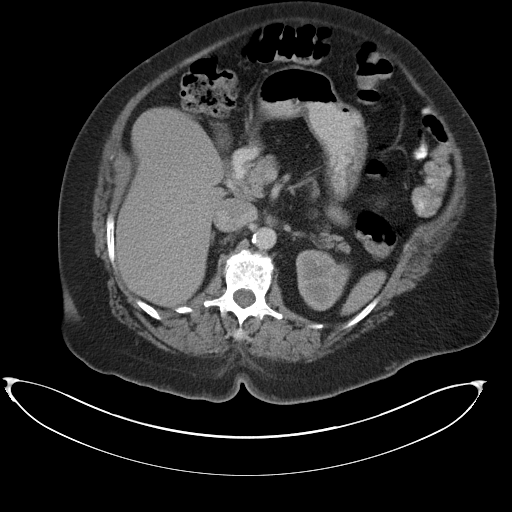
[im 59/82  bone]
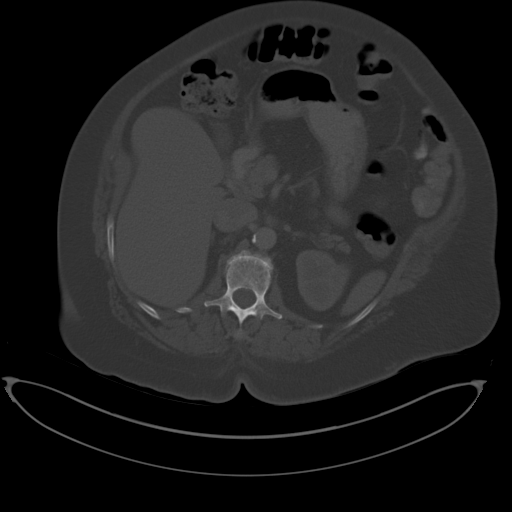
[im 64/82  soft-tissue]
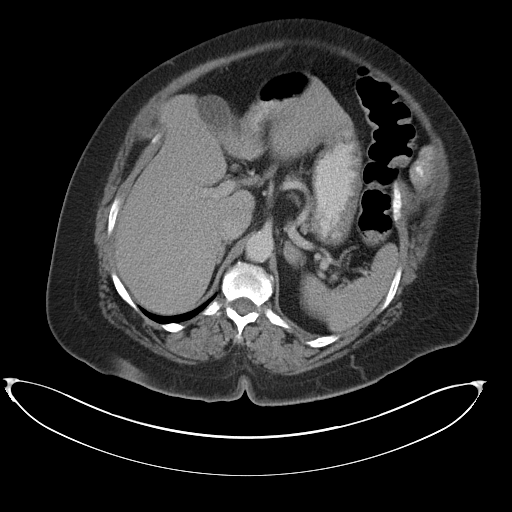
[im 64/82  lung]
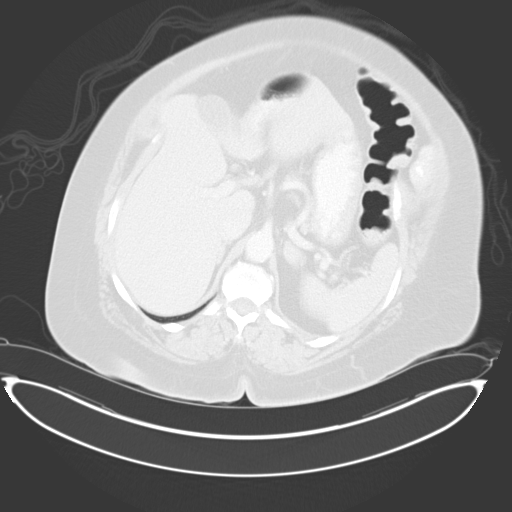
[im 68/82  soft-tissue]
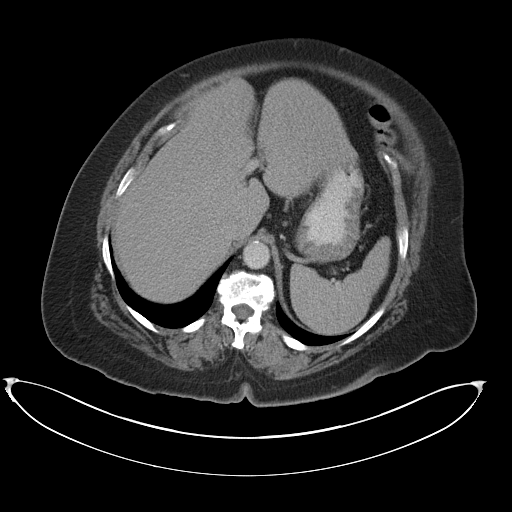
[im 68/82  lung]
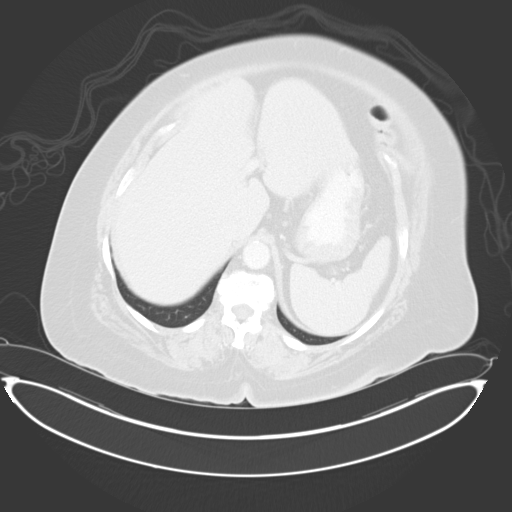
[im 73/82  lung]
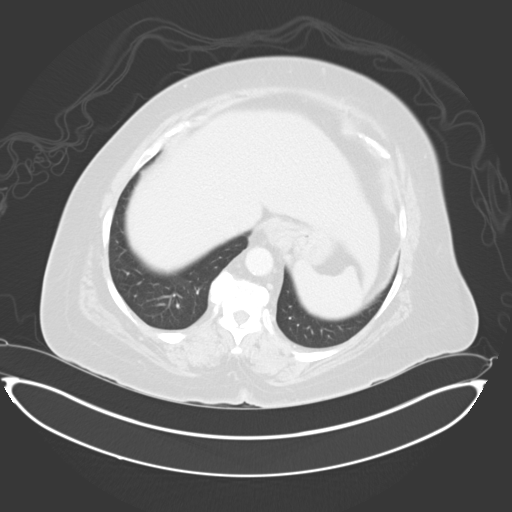
[im 77/82  soft-tissue]
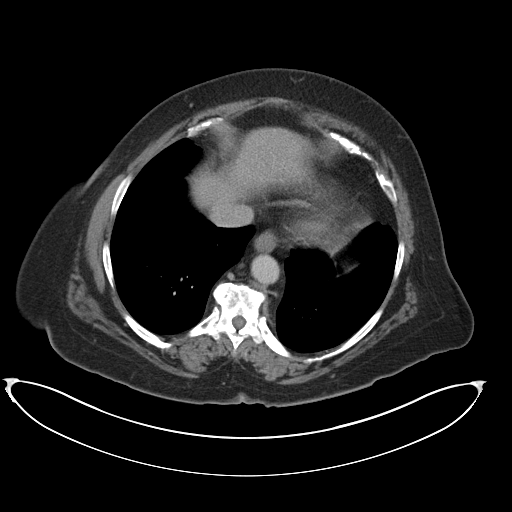
[im 77/82  lung]
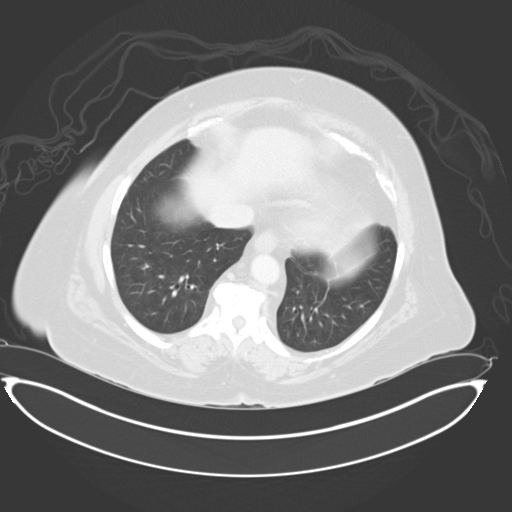

[13 of 32 positions shown; findings below may reference images not displayed]

FINDINGS: Lower chest: No consolidative or nodular pulmonary opacities. Normal
heart size.

Hepatobiliary: Liver is normal in size and contour without focal
hepatic lesion identified. Gallbladder is unremarkable. No
intrahepatic or extrahepatic biliary ductal dilatation.

Pancreas: Unremarkable

Spleen: Multiple indeterminate sub cm splenic hypodensities, too
small to characterize.

Adrenals/Urinary Tract: Normal adrenal glands. Kidneys are symmetric
in size and enhance with contrast. No hydronephrosis. There a few
foci of gas anteriorly within the urinary bladder.

Stomach/Bowel: No abnormal bowel wall thickening or evidence for
bowel obstruction. Normal appendix.

Vascular/Lymphatic: Normal caliber abdominal aorta. No
retroperitoneal lymphadenopathy. Persistent prominent bilateral
inguinal lymph nodes. Bilateral external iliac lymphadenopathy.

Other: There is soft tissue thickening and stranding involving the
left labia (image 82; series 2). Re- demonstrated left adnexal
cystic mass, favored represent a hydrosalpinx. Post hysterectomy.

Musculoskeletal: Multilevel degenerative changes of the lower
thoracic and lumbar spine. Patchy areas of sclerosis involving the
lower thoracic and lumbar spine likely secondary to a combination of
degenerative change, osteoporosis and obesity. No definite discrete
lytic or sclerotic lesions identified.
IMPRESSION: Nonspecific soft tissue thickening and stranding involving the left
labia. Recommend clinical correlation.

Few foci of gas within the urinary bladder, recommend correlation
for prior instrumentation.

Probable left adnexal hydrosalpinx.

No abnormal bowel wall thickening or evidence for bowel obstruction.

Persistent bilateral inguinal adenopathy.

## 2017-05-29 ENCOUNTER — Other Ambulatory Visit (HOSPITAL_COMMUNITY): Payer: Self-pay | Admitting: Adult Health

## 2017-05-29 DIAGNOSIS — C50412 Malignant neoplasm of upper-outer quadrant of left female breast: Secondary | ICD-10-CM

## 2017-06-12 ENCOUNTER — Ambulatory Visit (HOSPITAL_COMMUNITY)
Admission: RE | Admit: 2017-06-12 | Discharge: 2017-06-12 | Disposition: A | Discharge status: Home / Self Care | Payer: Medicare Other | Source: Ambulatory Visit | Attending: Oncology | Admitting: Oncology

## 2017-06-12 DIAGNOSIS — Z170 Estrogen receptor positive status [ER+]: Secondary | ICD-10-CM | POA: Insufficient documentation

## 2017-06-12 DIAGNOSIS — M85852 Other specified disorders of bone density and structure, left thigh: Secondary | ICD-10-CM | POA: Diagnosis not present

## 2017-06-12 DIAGNOSIS — C50412 Malignant neoplasm of upper-outer quadrant of left female breast: Secondary | ICD-10-CM | POA: Diagnosis not present

## 2017-08-13 ENCOUNTER — Other Ambulatory Visit (HOSPITAL_COMMUNITY): Payer: Self-pay | Admitting: *Deleted

## 2017-08-13 DIAGNOSIS — C50412 Malignant neoplasm of upper-outer quadrant of left female breast: Secondary | ICD-10-CM

## 2017-08-14 ENCOUNTER — Encounter (HOSPITAL_COMMUNITY): Payer: Self-pay | Admitting: Hematology

## 2017-08-14 ENCOUNTER — Inpatient Hospital Stay (HOSPITAL_COMMUNITY): Payer: Medicare Other

## 2017-08-14 ENCOUNTER — Inpatient Hospital Stay (HOSPITAL_COMMUNITY): Payer: Medicare Other | Attending: Hematology | Admitting: Hematology

## 2017-08-14 VITALS — BP 160/66 | HR 84 | Temp 98.1°F | Resp 20 | Wt 292.0 lb

## 2017-08-14 DIAGNOSIS — C50412 Malignant neoplasm of upper-outer quadrant of left female breast: Secondary | ICD-10-CM

## 2017-08-14 DIAGNOSIS — M8580 Other specified disorders of bone density and structure, unspecified site: Secondary | ICD-10-CM | POA: Insufficient documentation

## 2017-08-14 DIAGNOSIS — Z170 Estrogen receptor positive status [ER+]: Secondary | ICD-10-CM | POA: Insufficient documentation

## 2017-08-14 DIAGNOSIS — Z79811 Long term (current) use of aromatase inhibitors: Secondary | ICD-10-CM | POA: Insufficient documentation

## 2017-08-14 DIAGNOSIS — C773 Secondary and unspecified malignant neoplasm of axilla and upper limb lymph nodes: Secondary | ICD-10-CM | POA: Insufficient documentation

## 2017-08-14 DIAGNOSIS — M1990 Unspecified osteoarthritis, unspecified site: Secondary | ICD-10-CM | POA: Insufficient documentation

## 2017-08-14 DIAGNOSIS — M85852 Other specified disorders of bone density and structure, left thigh: Secondary | ICD-10-CM

## 2017-08-14 LAB — COMPREHENSIVE METABOLIC PANEL
ALBUMIN: 3.6 g/dL (ref 3.5–5.0)
ALK PHOS: 90 U/L (ref 38–126)
ALT: 27 U/L (ref 14–54)
ANION GAP: 9 (ref 5–15)
AST: 23 U/L (ref 15–41)
BILIRUBIN TOTAL: 0.5 mg/dL (ref 0.3–1.2)
BUN: 17 mg/dL (ref 6–20)
CALCIUM: 9.3 mg/dL (ref 8.9–10.3)
CO2: 30 mmol/L (ref 22–32)
Chloride: 102 mmol/L (ref 101–111)
Creatinine, Ser: 0.77 mg/dL (ref 0.44–1.00)
GFR calc non Af Amer: >60 mL/min (ref >60)
Glucose, Bld: 103 mg/dL — ABNORMAL HIGH (ref 65–99)
Potassium: 3.7 mmol/L (ref 3.5–5.1)
Sodium: 141 mmol/L (ref 135–145)
TOTAL PROTEIN: 8 g/dL (ref 6.5–8.1)

## 2017-08-14 LAB — CBC WITH DIFFERENTIAL/PLATELET
Basophils Absolute: 0 10*3/uL (ref 0.0–0.1)
Basophils Relative: 0 %
Eosinophils Absolute: 0.2 10*3/uL (ref 0.0–0.7)
Eosinophils Relative: 4 %
HEMATOCRIT: 35.7 % — AB (ref 36.0–46.0)
HEMOGLOBIN: 11 g/dL — AB (ref 12.0–15.0)
LYMPHS ABS: 1.8 10*3/uL (ref 0.7–4.0)
LYMPHS PCT: 28 %
MCH: 26.9 pg (ref 26.0–34.0)
MCHC: 30.8 g/dL (ref 30.0–36.0)
MCV: 87.3 fL (ref 78.0–100.0)
Monocytes Absolute: 0.4 10*3/uL (ref 0.1–1.0)
Monocytes Relative: 6 %
NEUTROS ABS: 3.9 10*3/uL (ref 1.7–7.7)
NEUTROS PCT: 62 %
Platelets: 232 10*3/uL (ref 150–400)
RBC: 4.09 MIL/uL (ref 3.87–5.11)
RDW: 14.6 % (ref 11.5–15.5)
WBC: 6.3 10*3/uL (ref 4.0–10.5)

## 2017-08-14 NOTE — Progress Notes (Signed)
Patient Care Team: Dione Housekeeper, MD as PCP - General (Family Medicine) Druscilla Brownie, MD as Consulting Physician (Dermatology) Minus Breeding, MD as Consulting Physician (Cardiology) Larey Dresser, MD as Consulting Physician (Cardiology) Kyung Rudd, MD as Consulting Physician (Radiation Oncology) Alphonsa Overall, MD as Consulting Physician (General Surgery) Whitney Muse, Kelby Fam, MD (Inactive) as Consulting Physician (Hematology and Oncology)  DIAGNOSIS:  Encounter Diagnosis  Name Primary?  . Malignant neoplasm of upper-outer quadrant of left female breast, unspecified estrogen receptor status ([place]) Yes    SUMMARY OF ONCOLOGIC HISTORY:   Breast cancer of upper-outer quadrant of left female breast ([place])   11/01/2014 Mammogram    Possible mass in the left breast upper outer quadrant measuring 13 mm suspicious for breast cancer confirmed through ultrasound a spiculated hypoechoic mass ill-defined, no enlarged lymph nodes      11/01/2014 Initial Diagnosis    Invasive ductal carcinoma, moderately differentiated, ER > 90%, PR> 90%, HER-2 -2+ by IHC, ratio 1.15, KI 67: 29%, T1 cN0 stage IA clinical stage      11/24/2014 Echocardiogram    Systolic function was normal. The estimated ejection fraction was in the range of 55% to 60%.       12/25/2014 Surgery    Left lumpectomy: IDC 1.7 cm, positive for LVI, with DCIS, 1/1 sentinel node positive deposit 1.9 cm with extracapsular extension, ER 90%, PR 90%, HER-2 positive ratio 2.4, Ki-67 29% T1 cN1 stage II a      02/01/2015 - 04/19/2015 Chemotherapy    Abraxane/Herceptin      03/20/2015 Echocardiogram    Systolic function wasnormal. The estimated ejection fraction was in the range of 60%to 65%.      05/08/2015 - 06/22/2015 Radiation Therapy    Dr. Lisbeth Renshaw       05/10/2015 - 02/07/2016 Antibody Plan    Herceptin every 21 days to complete 52 weeks worth of therapy.      06/13/2015 Echocardiogram    2D echo- The estimated  ejection fraction was in the range of 60% to 65%. Diastolic function is abnormal, indeterminate grade. Wallmotion was normal.      06/28/2015 Imaging    Bone density- BMD as determined from Femur Total Left is 0.994 g/cm2 with a T-Score of -0.1. This patient is considered normal according to [facility]) criteria.       07/13/2015 -  Anti-estrogen oral therapy    Arimidex daily      09/12/2015 Echocardiogram    The estimated ejection fraction was in the range of 60% to 65%. Wall motion was normal; there were no regional wall motion abnormalities. Doppler parameters are consistent with abnormal L ventricular relaxation (grade 1 diastolic dysfunction).      12/04/2015 Echocardiogram    Left ventricle: The cavity size was normal. Wall thickness was   increased increased in a pattern of mild to moderate LVH.   Systolic function was normal. The estimated ejection fraction was   in the range of 60% to 65%. Wall motion was normal; there were no   regional wall motion abnormalities. Features are consistent with   a pseudonormal left ventricular filling pattern, with concomitant   abnormal relaxation and increased filling pressure (grade 2   diastolic dysfunction). Doppler parameters are consistent with   high ventricular filling pressure.      03/21/2016 Procedure    Port removed by Dr. Lucia Gaskins       CHIEF COMPLIANT: Follow up of left breast cancer.  INTERVAL HISTORY: Kristy L  Harris is a 72 year old pleasant African-American female who is seen for follow-up of her left breast cancer.  She is tolerating anastrozole very well.  Denies any worsening of her baseline hot flashes are arthritic pains in the lower extremities.  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  She can only take few steps with the help of a cane.  She uses a walker with wheels at home.  She does minimal household chores like washing dishes.  She lives with her grandson.  No new  onset pains.  She takes Celebrex twice daily for her arthritic pains.  She is taking 1 calcium and vitamin D tablet daily.  REVIEW OF SYSTEMS:   Constitutional: Denies fevers, chills or abnormal weight loss Eyes: Denies blurriness of vision Ears, nose, mouth, throat, and face: Denies mucositis or sore throat Respiratory: Denies cough, dyspnea or wheezes Cardiovascular: Denies palpitation, chest discomfort Gastrointestinal:  Denies nausea, heartburn or change in bowel habits Skin: Denies abnormal skin rashes Lymphatics: Denies new lymphadenopathy or easy bruising Neurological:Denies numbness, tingling or new weaknesses Behavioral/Psych: Mood is stable, no new changes  Extremities: No lower extremity edema.  Has bilateral leg pains of many years duration. Breast:  denies any pain or lumps or nodules in either breasts All other systems were reviewed with the patient and are negative.  I have reviewed the past medical history, past surgical history, social history and family history with the patient and they are unchanged from previous note.  ALLERGIES:  is allergic to lisinopril and penicillins.  MEDICATIONS:  Current Outpatient Medications  Medication Sig Dispense Refill  . Blood Glucose Monitoring Suppl (BLOOD GLUCOSE MONITOR SYSTEM) w/Device KIT With lancets and strips #100 6rf Dx: E11.9 Test bid    . celecoxib (CELEBREX) 200 MG capsule TAKE ONE CAPSULE BY MOUTH TWICE A DAY    . doxycycline (VIBRAMYCIN) 100 MG capsule Take by mouth.    Marland Kitchen glipiZIDE (GLUCOTROL XL) 10 MG 24 hr tablet Take by mouth.    Marland Kitchen acetaminophen (TYLENOL) 500 MG tablet Take 1,000 mg by mouth every 6 (six) hours as needed for mild pain or headache.    . albuterol (PROVENTIL HFA;VENTOLIN HFA) 108 (90 BASE) MCG/ACT inhaler Inhale 2 puffs into the lungs every 4 (four) hours as needed for shortness of breath.    . anastrozole (ARIMIDEX) 1 MG tablet TAKE 1 TABLET (1 MG TOTAL) BY MOUTH DAILY. 90 tablet 1  . aspirin EC 81 MG  tablet Take 81 mg by mouth daily.    Marland Kitchen atorvastatin (LIPITOR) 20 MG tablet Take 20 mg by mouth every morning.     . celecoxib (CELEBREX) 200 MG capsule     . cholecalciferol (VITAMIN D) 1000 units tablet Take 2,000 Units by mouth daily.    . ferrous sulfate 325 (65 FE) MG tablet Take 325 mg by mouth 3 (three) times daily with meals.    . fluconazole (DIFLUCAN) 100 MG tablet TAKE ONE (1) TABLET EACH DAY    . fluconazole (DIFLUCAN) 150 MG tablet     . furosemide (LASIX) 20 MG tablet Take 20 mg by mouth 3 (three) times a week.    Marland Kitchen HYDROcodone-acetaminophen (NORCO/VICODIN) 5-325 MG tablet Take 1-2 tablets by mouth every 6 (six) hours as needed. 10 tablet 0  . KLOR-CON M20 20 MEQ tablet TAKE 1 TABLET (20 MEQ TOTAL) BY MOUTH DAILY. 30 tablet 2  . lidocaine-prilocaine (EMLA) cream APPLY A QUARTER SIXE AMOUNT TO PORT SITE 1 HOUR PRIO TO CHEMO. DO  NOT RUB IN. COVER W/ PLASTIC WRAP  3  . losartan (COZAAR) 100 MG tablet Take 100 mg by mouth daily.    . meloxicam (MOBIC) 15 MG tablet Take 1 tablet (15 mg total) by mouth daily as needed for pain. 7 tablet 0  . meloxicam (MOBIC) 7.5 MG tablet     . metFORMIN (GLUCOPHAGE-XR) 500 MG 24 hr tablet TAKE ONE TABLET BY MOUTH TWICE DAILY    . metoprolol succinate (TOPROL-XL) 100 MG 24 hr tablet TAKE ONE (1) TABLET EACH DAY    . pantoprazole (PROTONIX) 40 MG tablet TAKE 1 TABLET (40 MG TOTAL) BY MOUTH DAILY. 90 tablet 0  . polyethylene glycol (MIRALAX / GLYCOLAX) packet Take 17 g by mouth daily as needed for mild constipation. 14 each 0  . verapamil (CALAN-SR) 120 MG CR tablet Take 120 mg by mouth daily.     No current facility-administered medications for this visit.     PHYSICAL EXAMINATION: ECOG PERFORMANCE STATUS: 2 - Symptomatic, <50% confined to bed  Vitals:   08/14/17 1055  BP: (!) 160/66  Pulse: 84  Resp: 20  Temp: 98.1 F (36.7 C)  SpO2: 95%   Filed Weights   08/14/17 1055  Weight: 292 lb (132.5 kg)    GENERAL:alert, no distress and  comfortable SKIN: skin color, texture, turgor are normal, no rashes or significant lesions  NECK: supple, thyroid normal size, non-tender, without nodularity LYMPH:  no palpable lymphadenopathy in the cervical, axillary or inguinal  ABDOMEN:abdomen soft, non-tender and normal bowel sounds MUSCULOSKELETAL:no cyanosis of digits and no clubbing   EXTREMITIES: Trace edema bilaterally. BREAST: Left breast lumpectomy scar in the upper outer quadrant is stable.  No palpable mass in bilateral breast.  No palpable axillary adenopathy.  LABORATORY DATA:  I have reviewed the data as listed CMP Latest Ref Rng & Units 08/14/2017 02/06/2017 08/07/2016  Glucose 65 - 99 mg/dL 103(H) 158(H) 155(H)  BUN 6 - 20 mg/dL '17 20 15  '$ Creatinine 0.44 - 1.00 mg/dL 0.77 0.98 0.72  Sodium 135 - 145 mmol/L 141 140 137  Potassium 3.5 - 5.1 mmol/L 3.7 4.1 4.1  Chloride 101 - 111 mmol/L 102 101 100(L)  CO2 22 - 32 mmol/L '30 29 26  '$ Calcium 8.9 - 10.3 mg/dL 9.3 9.7 9.6  Total Protein 6.5 - 8.1 g/dL 8.0 8.0 8.5(H)  Total Bilirubin 0.3 - 1.2 mg/dL 0.5 0.8 0.6  Alkaline Phos 38 - 126 U/L 90 122 117  AST 15 - 41 U/L '23 23 26  '$ ALT 14 - 54 U/L '27 23 28   '$ No results found for: PIR518   Lab Results  Component Value Date   WBC 6.3 08/14/2017   HGB 11.0 (L) 08/14/2017   HCT 35.7 (L) 08/14/2017   MCV 87.3 08/14/2017   PLT 232 08/14/2017   NEUTROABS 3.9 08/14/2017    ASSESSMENT & PLAN:  Breast cancer of upper-outer quadrant of left female breast ([place]) 1. 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  S/P left lumpectomy on 12/25/2014 followed by adjuvant chemotherapy consisting of Abraxane/Herceptin (02/01/2015- 04/19/2015).  She then underwent XRT by Dr. Lisbeth Renshaw (05/08/2015- 06/22/2015) with continuation of Herceptin x 52 weeks finishing on 02/07/2016. -Arimidex started in May 2017, tolerates it well.  Heart flashes stable.  Arthritic pains in the  lower extremities have been stable. -Today's examination did not reveal any suspicious masses.  Last mammogram was on 11/11/2016, BI-RADS Category 2. -We will  schedule another mammogram in September.  Today's labs are within normal limits.  We have reviewed with the patient.  2.  Osteopenia: Most recent DEXA scan on 06/12/2017 shows femoral neck T score of -2.3.  This is a significant change when compared to her prior DEXA scan in 2017 when the T score was -0.1.  Part of it is likely coming from anastrozole.  We talked about starting her on Prolia 60 mg every 6 months for treatment of aromatase inhibitor induced bone loss.  We talked about side effects in detail including but not limited to hypocalcemia, and osteonecrosis of the jaw.  She will see her dentist and get clearance prior to start of Prolia.  She was told to increase calcium and vitamin D to twice daily.    I spent 25 minutes talking to the patient of which more than half was spent in counseling and coordination of care.  Orders Placed This Encounter  Procedures  . MM DIAG BREAST TOMO BILATERAL    Standing Status:   Future    Standing Expiration Date:   08/15/2018    Order Specific Question:   Reason for Exam (SYMPTOM  OR DIAGNOSIS REQUIRED)    Answer:   breast cancer    Order Specific Question:   Preferred imaging location?    Answer:   [facility]  . Comprehensive metabolic panel    Standing Status:   Future    Standing Expiration Date:   08/14/2018  . CBC with Differential    Standing Status:   Future    Standing Expiration Date:   08/14/2018   The patient has a good understanding of the overall plan. she agrees with it. she will call with any problems that may develop before the next visit here.   Derek Jack, MD 08/14/17

## 2017-08-14 NOTE — Patient Instructions (Signed)
[facility] at [facility]  Discharge Instructions:  You were seen by dr. k _______________________________________________________________  Thank you for choosing [facility] at [facility] to provide your oncology and hematology care.  To afford each patient quality time with our providers, please arrive at least 15 minutes before your scheduled appointment.  You need to re-schedule your appointment if you arrive 10 or more minutes late.  We strive to give you quality time with our providers, and arriving late affects you and other patients whose appointments are after yours.  Also, if you no show three or more times for appointments you may be dismissed from the clinic.  Again, thank you for choosing  Cancer Center at [facility]. Our hope is that these requests will allow you access to exceptional care and in a timely manner. _______________________________________________________________  If you have questions after your visit, please contact our office at [phone] between the hours of 8:30 a.m. and 5:00 p.m. Voicemails left after 4:30 p.m. will not be returned until the following business day. _______________________________________________________________  For prescription refill requests, have your pharmacy contact our office. _______________________________________________________________  Recommendations made by the consultant and any test results will be sent to your referring physician. _______________________________________________________________ 

## 2017-08-14 NOTE — Assessment & Plan Note (Signed)
1. 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  S/P left lumpectomy on 12/25/2014 followed by adjuvant chemotherapy consisting of Abraxane/Herceptin (02/01/2015- 04/19/2015).  She then underwent XRT by Dr. Lisbeth Renshaw (05/08/2015- 06/22/2015) with continuation of Herceptin x 52 weeks finishing on 02/07/2016. -Arimidex started in May 2017, tolerates it well.  Heart flashes stable.  Arthritic pains in the lower extremities have been stable. -Today's examination did not reveal any suspicious masses.  Last mammogram was on 11/11/2016, BI-RADS Category 2. -We will schedule another mammogram in September.  Today's labs are within normal limits.  We have reviewed with the patient.  2.  Osteopenia: Most recent DEXA scan on 06/12/2017 shows femoral neck T score of -2.3.  This is a significant change when compared to her prior DEXA scan in 2017 when the T score was -0.1.  Part of it is likely coming from anastrozole.  We talked about starting her on Prolia 60 mg every 6 months for treatment of aromatase inhibitor induced bone loss.  We talked about side effects in detail including but not limited to hypocalcemia, and osteonecrosis of the jaw.  She will see her dentist and get clearance prior to start of Prolia.  She was told to increase calcium and vitamin D to twice daily.

## 2017-09-09 ENCOUNTER — Other Ambulatory Visit (HOSPITAL_COMMUNITY): Payer: Self-pay | Admitting: Hematology

## 2017-09-14 ENCOUNTER — Other Ambulatory Visit (HOSPITAL_COMMUNITY): Payer: Self-pay

## 2017-09-14 DIAGNOSIS — M85852 Other specified disorders of bone density and structure, left thigh: Secondary | ICD-10-CM

## 2017-09-14 DIAGNOSIS — C50412 Malignant neoplasm of upper-outer quadrant of left female breast: Secondary | ICD-10-CM

## 2017-09-18 ENCOUNTER — Encounter (HOSPITAL_COMMUNITY): Payer: Self-pay

## 2017-09-18 ENCOUNTER — Inpatient Hospital Stay (HOSPITAL_COMMUNITY): Payer: Medicare Other

## 2017-09-18 ENCOUNTER — Other Ambulatory Visit: Payer: Self-pay

## 2017-09-18 ENCOUNTER — Inpatient Hospital Stay (HOSPITAL_COMMUNITY): Payer: Medicare Other | Attending: Hematology

## 2017-09-18 VITALS — BP 122/65 | HR 83 | Temp 98.6°F | Resp 20

## 2017-09-18 DIAGNOSIS — C50412 Malignant neoplasm of upper-outer quadrant of left female breast: Secondary | ICD-10-CM

## 2017-09-18 DIAGNOSIS — M8580 Other specified disorders of bone density and structure, unspecified site: Secondary | ICD-10-CM | POA: Diagnosis present

## 2017-09-18 DIAGNOSIS — M85852 Other specified disorders of bone density and structure, left thigh: Secondary | ICD-10-CM

## 2017-09-18 LAB — COMPREHENSIVE METABOLIC PANEL
ALT: 25 U/L (ref 0–44)
AST: 23 U/L (ref 15–41)
Albumin: 3.5 g/dL (ref 3.5–5.0)
Alkaline Phosphatase: 100 U/L (ref 38–126)
Anion gap: 10 (ref 5–15)
BUN: 14 mg/dL (ref 8–23)
CHLORIDE: 101 mmol/L (ref 98–111)
CO2: 28 mmol/L (ref 22–32)
Calcium: 9.1 mg/dL (ref 8.9–10.3)
Creatinine, Ser: 0.9 mg/dL (ref 0.44–1.00)
Glucose, Bld: 183 mg/dL — ABNORMAL HIGH (ref 70–99)
Potassium: 3.8 mmol/L (ref 3.5–5.1)
Sodium: 139 mmol/L (ref 135–145)
Total Bilirubin: 0.6 mg/dL (ref 0.3–1.2)
Total Protein: 7.8 g/dL (ref 6.5–8.1)

## 2017-09-18 MED ORDER — DENOSUMAB 60 MG/ML ~~LOC~~ SOSY
60.0000 mg | PREFILLED_SYRINGE | Freq: Once | SUBCUTANEOUS | Status: AC
  Administered 2017-09-18: 60 mg via SUBCUTANEOUS
  Filled 2017-09-18: qty 1

## 2017-09-18 NOTE — Progress Notes (Signed)
Pt here today for first Prolia injection. Pt educated about medication and consent obtained. Pt given injection in her right arm. Pt tolerated injection well. Pt stable and discharged home via wheelchair with family. Pt to return in 6 months for next Prolia injection. Pt advised to call the clinic if she has any problems or concerns. Pt and family verbalized understanding.

## 2017-11-05 ENCOUNTER — Other Ambulatory Visit (HOSPITAL_COMMUNITY): Payer: Self-pay | Admitting: Hematology

## 2017-11-05 DIAGNOSIS — Z853 Personal history of malignant neoplasm of breast: Secondary | ICD-10-CM

## 2017-11-17 ENCOUNTER — Ambulatory Visit (HOSPITAL_COMMUNITY): Payer: Medicare Other

## 2017-11-17 ENCOUNTER — Ambulatory Visit (HOSPITAL_COMMUNITY)
Admission: RE | Admit: 2017-11-17 | Discharge: 2017-11-17 | Disposition: A | Discharge status: Home / Self Care | Payer: Medicare Other | Source: Ambulatory Visit | Attending: Hematology | Admitting: Hematology

## 2017-11-17 DIAGNOSIS — C50412 Malignant neoplasm of upper-outer quadrant of left female breast: Secondary | ICD-10-CM

## 2017-11-18 ENCOUNTER — Other Ambulatory Visit (HOSPITAL_COMMUNITY): Payer: Self-pay | Admitting: Hematology

## 2017-11-18 ENCOUNTER — Other Ambulatory Visit (HOSPITAL_COMMUNITY): Payer: Self-pay | Admitting: *Deleted

## 2017-11-18 DIAGNOSIS — Z853 Personal history of malignant neoplasm of breast: Secondary | ICD-10-CM

## 2017-11-18 DIAGNOSIS — C50412 Malignant neoplasm of upper-outer quadrant of left female breast: Secondary | ICD-10-CM

## 2017-11-18 MED ORDER — ANASTROZOLE 1 MG PO TABS: 1.0000 mg | ORAL_TABLET | Freq: Every day | ORAL | 1 refills | Status: DC

## 2017-12-01 ENCOUNTER — Ambulatory Visit (HOSPITAL_COMMUNITY): Payer: Medicare Other

## 2017-12-01 ENCOUNTER — Ambulatory Visit (HOSPITAL_COMMUNITY)
Admission: RE | Admit: 2017-12-01 | Discharge: 2017-12-01 | Disposition: A | Discharge status: Home / Self Care | Payer: Medicare Other | Source: Ambulatory Visit | Attending: Hematology | Admitting: Hematology

## 2017-12-01 DIAGNOSIS — C50412 Malignant neoplasm of upper-outer quadrant of left female breast: Secondary | ICD-10-CM | POA: Diagnosis present

## 2018-01-01 NOTE — Congregational Nurse Program (Signed)
12/10/17  Check B/P 123/58 Pulse 77. Blood sugar 104.  Take meds as ordered.  Theron Arista, RN [phone]

## 2018-02-11 ENCOUNTER — Other Ambulatory Visit (HOSPITAL_COMMUNITY): Payer: Medicare Other

## 2018-02-11 ENCOUNTER — Ambulatory Visit (HOSPITAL_COMMUNITY): Payer: Medicare Other | Admitting: Hematology

## 2018-02-15 ENCOUNTER — Encounter (HOSPITAL_COMMUNITY): Payer: Self-pay | Admitting: Hematology

## 2018-02-15 ENCOUNTER — Inpatient Hospital Stay (HOSPITAL_BASED_OUTPATIENT_CLINIC_OR_DEPARTMENT_OTHER): Payer: Medicare Other | Admitting: Hematology

## 2018-02-15 ENCOUNTER — Inpatient Hospital Stay (HOSPITAL_COMMUNITY): Payer: Medicare Other | Attending: Hematology

## 2018-02-15 ENCOUNTER — Other Ambulatory Visit: Payer: Self-pay

## 2018-02-15 ENCOUNTER — Other Ambulatory Visit (HOSPITAL_COMMUNITY): Payer: Medicare Other

## 2018-02-15 ENCOUNTER — Ambulatory Visit (HOSPITAL_COMMUNITY): Payer: Medicare Other | Admitting: Hematology

## 2018-02-15 VITALS — BP 179/76 | HR 73 | Temp 97.9°F | Resp 20 | Wt 287.0 lb

## 2018-02-15 DIAGNOSIS — C50919 Malignant neoplasm of unspecified site of unspecified female breast: Secondary | ICD-10-CM

## 2018-02-15 DIAGNOSIS — Z170 Estrogen receptor positive status [ER+]: Secondary | ICD-10-CM | POA: Diagnosis not present

## 2018-02-15 DIAGNOSIS — M8580 Other specified disorders of bone density and structure, unspecified site: Secondary | ICD-10-CM | POA: Diagnosis not present

## 2018-02-15 DIAGNOSIS — C50412 Malignant neoplasm of upper-outer quadrant of left female breast: Secondary | ICD-10-CM

## 2018-02-15 DIAGNOSIS — D649 Anemia, unspecified: Secondary | ICD-10-CM | POA: Insufficient documentation

## 2018-02-15 DIAGNOSIS — N951 Menopausal and female climacteric states: Secondary | ICD-10-CM | POA: Insufficient documentation

## 2018-02-15 DIAGNOSIS — C773 Secondary and unspecified malignant neoplasm of axilla and upper limb lymph nodes: Secondary | ICD-10-CM | POA: Diagnosis not present

## 2018-02-15 LAB — COMPREHENSIVE METABOLIC PANEL
ALBUMIN: 3.6 g/dL (ref 3.5–5.0)
ALT: 17 U/L (ref 0–44)
ANION GAP: 9 (ref 5–15)
AST: 15 U/L (ref 15–41)
Alkaline Phosphatase: 80 U/L (ref 38–126)
BUN: 13 mg/dL (ref 8–23)
CHLORIDE: 104 mmol/L (ref 98–111)
CO2: 28 mmol/L (ref 22–32)
Calcium: 9.3 mg/dL (ref 8.9–10.3)
Creatinine, Ser: 0.76 mg/dL (ref 0.44–1.00)
GFR calc Af Amer: >60 mL/min (ref >60)
GFR calc non Af Amer: >60 mL/min (ref >60)
GLUCOSE: 113 mg/dL — AB (ref 70–99)
POTASSIUM: 4 mmol/L (ref 3.5–5.1)
SODIUM: 141 mmol/L (ref 135–145)
Total Bilirubin: 0.6 mg/dL (ref 0.3–1.2)
Total Protein: 7.8 g/dL (ref 6.5–8.1)

## 2018-02-15 LAB — CBC WITH DIFFERENTIAL/PLATELET
ABS IMMATURE GRANULOCYTES: 0.02 10*3/uL (ref 0.00–0.07)
Basophils Absolute: 0 10*3/uL (ref 0.0–0.1)
Basophils Relative: 0 %
Eosinophils Absolute: 0.2 10*3/uL (ref 0.0–0.5)
Eosinophils Relative: 3 %
HCT: 34.7 % — ABNORMAL LOW (ref 36.0–46.0)
HEMOGLOBIN: 10.1 g/dL — AB (ref 12.0–15.0)
IMMATURE GRANULOCYTES: 0 %
LYMPHS PCT: 31 %
Lymphs Abs: 1.7 10*3/uL (ref 0.7–4.0)
MCH: 25.1 pg — ABNORMAL LOW (ref 26.0–34.0)
MCHC: 29.1 g/dL — ABNORMAL LOW (ref 30.0–36.0)
MCV: 86.3 fL (ref 80.0–100.0)
MONOS PCT: 6 %
Monocytes Absolute: 0.3 10*3/uL (ref 0.1–1.0)
NEUTROS ABS: 3.2 10*3/uL (ref 1.7–7.7)
NEUTROS PCT: 60 %
PLATELETS: 240 10*3/uL (ref 150–400)
RBC: 4.02 MIL/uL (ref 3.87–5.11)
RDW: 16.1 % — ABNORMAL HIGH (ref 11.5–15.5)
WBC: 5.4 10*3/uL (ref 4.0–10.5)
nRBC: 0 % (ref 0.0–0.2)

## 2018-02-15 NOTE — Patient Instructions (Signed)
[facility] at [facility] Discharge Instructions  Follow up in 6 months with labs    Thank you for choosing [facility] at [facility] to provide your oncology and hematology care.  To afford each patient quality time with our provider, please arrive at least 15 minutes before your scheduled appointment time.   If you have a lab appointment with the Cancer Center please come in thru the  Main Entrance and check in at the main information desk  You need to re-schedule your appointment should you arrive 10 or more minutes late.  We strive to give you quality time with our providers, and arriving late affects you and other patients whose appointments are after yours.  Also, if you no show three or more times for appointments you may be dismissed from the clinic at the providers discretion.     Again, thank you for choosing [facility].  Our hope is that these requests will decrease the amount of time that you wait before being seen by our physicians.       _____________________________________________________________  Should you have questions after your visit to [facility], please contact our office at [phone] between the hours of 8:00 a.m. and 4:30 p.m.  Voicemails left after 4:00 p.m. will not be returned until the following business day.  For prescription refill requests, have your pharmacy contact our office and allow 72 hours.    Cancer Center Support Programs:   > Cancer Support Group  2nd Tuesday of the month 1pm-2pm, Journey Room    

## 2018-02-15 NOTE — Assessment & Plan Note (Signed)
1. 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  S/P left lumpectomy on 12/25/2014 followed by adjuvant chemotherapy consisting of Abraxane/Herceptin (02/01/2015- 04/19/2015).  She then underwent XRT by Dr. Lisbeth Renshaw (05/08/2015- 06/22/2015) with continuation of Herceptin x 52 weeks finishing on 02/07/2016. -Arimidex started in May 2017, tolerates it well.  Heart flashes stable.  Arthritic pains in the lower extremities have been stable. -Today's examination shows no palpable masses in bilateral breast.  Left breast upper outer quadrant lumpectomy scar is unchanged.  No palpable adenopathy. - We reviewed the results of the mammogram dated 12/01/2017, showing BI-RADS 2. -We have also reviewed her blood work.  No evidence of recurrence at this time. -We will see her back in 6 months for follow-up.  2.  Osteopenia:  -DEXA scan on 06/12/2017 shows T score of -2.3 in the femoral neck. -DEXA scan in 2017 showed T score of -0.1. -She was started on Prolia on 09/18/2017.  She is tolerating it well. -She was advised to take calcium and vitamin D twice daily.  3.  Normocytic anemia: - She is progressively developing anemia.  Hemoglobin showed 10.1.  MCV is slightly trending down. -This is likely from iron deficiency.  She is taking 1 tablet of iron daily.  She was told to increase it to 3 tablets daily.  She is tolerating it very well. -I plan to repeat her ferritin and iron panel at next visit.

## 2018-02-15 NOTE — Progress Notes (Signed)
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, ER+/PR+/HER2+, with 1/1 sentinel lymph node for metastatic disease  CURRENT THERAPY: Anastrozole   BRIEF ONCOLOGIC HISTORY:    Breast cancer of upper-outer quadrant of left female breast ([place])   11/01/2014 Mammogram    Possible mass in the left breast upper outer quadrant measuring 13 mm suspicious for breast cancer confirmed through ultrasound a spiculated hypoechoic mass ill-defined, no enlarged lymph nodes    11/01/2014 Initial Diagnosis    Invasive ductal carcinoma, moderately differentiated, ER > 90%, PR> 90%, HER-2 -2+ by IHC, ratio 1.15, KI 67: 29%, T1 cN0 stage IA clinical stage    11/24/2014 Echocardiogram    Systolic function was normal. The estimated ejection fraction was in the range of 55% to 60%.     12/25/2014 Surgery    Left lumpectomy: IDC 1.7 cm, positive for LVI, with DCIS, 1/1 sentinel node positive deposit 1.9 cm with extracapsular extension, ER 90%, PR 90%, HER-2 positive ratio 2.4, Ki-67 29% T1 cN1 stage II a    02/01/2015 - 04/19/2015 Chemotherapy    Abraxane/Herceptin    03/20/2015 Echocardiogram    Systolic function wasnormal. The estimated ejection fraction was in the range of 60%to 65%.    05/08/2015 - 06/22/2015 Radiation Therapy    Dr. Lisbeth Renshaw     05/10/2015 - 02/07/2016 Antibody Plan    Herceptin every 21 days to complete 52 weeks worth of therapy.    06/13/2015 Echocardiogram    2D echo- The estimated ejection fraction was in the range of 60% to 65%. Diastolic function is abnormal, indeterminate grade. Wallmotion was normal.    06/28/2015 Imaging    Bone density- BMD as determined from Femur Total Left is 0.994 g/cm2 with a T-Score of -0.1. This patient is considered normal  according to [facility]) criteria.     07/13/2015 -  Anti-estrogen oral therapy    Arimidex daily    09/12/2015 Echocardiogram    The estimated ejection fraction was in the range of 60% to 65%. Wall motion was normal; there were no regional wall motion abnormalities. Doppler parameters are consistent with abnormal L ventricular relaxation (grade 1 diastolic dysfunction).    12/04/2015 Echocardiogram    Left ventricle: The cavity size was normal. Wall thickness was   increased increased in a pattern of mild to moderate LVH.   Systolic function was normal. The estimated ejection fraction was   in the range of 60% to 65%. Wall motion was normal; there were no   regional wall motion abnormalities. Features are consistent with   a pseudonormal left ventricular filling pattern, with concomitant   abnormal relaxation and increased filling pressure (grade 2   diastolic dysfunction). Doppler parameters are consistent with   high ventricular filling pressure.    03/21/2016 Procedure    Port removed by Dr. Lucia Gaskins      CANCER STAGING: Cancer Staging Breast cancer of upper-outer quadrant of left female breast [facility]) Staging form: Breast, AJCC 7th Edition - Clinical stage from 02/08/2015: Stage IIA (T1c, N1, M0) - Signed by Baird Cancer, PA-C on 02/08/2015    INTERVAL HISTORY:  Kristy Harris 72 y.o. female returns for routine follow-up for left breast cancer. She is here today with her daughter. She is doing well.  She does have occasional hot flashes and aches and pains, but it is no worse since starting the medication. She denies any nausea ,vomiting, or diarrhea. Denies any new paisn or lumps present. Denies any fevers or recent infections. Denies any bleeding or easy bruising. She reports her appetite at 100% and her energy level at 50%. She walks with a walker due to some balance issues. She lives at home with her daughter and she helps her with tasks she is unable to  complete.     REVIEW OF SYSTEMS:  Review of Systems  Constitutional: Positive for fatigue.  All other systems reviewed and are negative.    PAST MEDICAL/SURGICAL HISTORY:  Past Medical History:  Diagnosis Date  . Anemia    takes iron supplement  . Arthritis    knees  . COPD (chronic obstructive pulmonary disease) ([place])   . Enlarged heart   . GERD (gastroesophageal reflux disease)   . Hidradenitis suppurativa    buttocks  . History of breast cancer 12/2014  . Hyperlipidemia   . Hypertension    states under control with meds., has been on med. > 40 yrs.  . Morbid obesity ([place])   . Non-insulin dependent type 2 diabetes mellitus ([place])   . Shortness of breath dyspnea    with exertion  . Urinary frequency    Past Surgical History:  Procedure Laterality Date  . ABDOMINAL HYSTERECTOMY  25 yrs ago   partial  . BLADDER SUSPENSION     x 2  . BREAST LUMPECTOMY WITH RADIOACTIVE SEED AND SENTINEL LYMPH NODE BIOPSY Left 12/25/2014   Procedure: RADIOACTIVE SEED GIUDED LEFT BREAST LUMPECTOMY, LEFT AXILLARY SENTINEL LYMPH NODE BIOPSY;  Surgeon: Alphonsa Overall, MD;  Location: [facility];  Service: General;  Laterality: Left;  . CARDIAC CATHETERIZATION  02/21/2011   Normal coronary arteries, normal EF  . COLONOSCOPY WITH PROPOFOL  11/18/2013  . ESOPHAGOGASTRODUODENOSCOPY (EGD) WITH PROPOFOL  11/18/2013  . EXCISION HYDRADENITIS LABIA N/A 12/08/2013   Procedure: EXCISION HIDRADENITIS PUBIC AREA;  Surgeon: Alphonsa Overall, MD;  Location: [facility];  Service: General;  Laterality: N/A;  . HYDRADENITIS EXCISION Left 12/08/2013   Procedure: EXCISION HIDRADENITIS AXILLA;  Surgeon: Alphonsa Overall, MD;  Location: [facility];  Service: General;  Laterality: Left;  . INCISION AND DRAINAGE ABSCESS  08/17/2008   perineum and buttock  . IRRIGATION AND DEBRIDEMENT ABSCESS Right 12/23/2012   Procedure: incision  AND DEBRIDEMENT right buttock infection ;  Surgeon: Shann Medal, MD;  Location: [facility];  Service: General;   Laterality: Right;  . PILONIDAL CYST / SINUS EXCISION  06/04/2004  . PILONIDAL CYST EXCISION    . PORT-A-CATH REMOVAL Right 03/21/2016   Procedure: MINOR REMOVAL PORT-A-CATH;  Surgeon: Alphonsa Overall, MD;  Location: [facility];  Service: General;  Laterality: Right;  MINOR REMOVAL PORT-A-CATH  . PORTACATH PLACEMENT N/A 01/12/2015   Procedure: INSERTION PORT-A-CATH;  Surgeon: Alphonsa Overall, MD;  Location: [facility];  Service: General;  Laterality: N/A;  . TONSILLECTOMY       SOCIAL HISTORY:  Social History   Socioeconomic History  . Marital status: Widowed    Spouse name: Not on file  . Number of children: 5  . Years of education: Not on file  . Highest education level: Not on file  Occupational History  . Occupation: Textile work    Comment: Disabled  Social Needs  . Financial resource strain: Not on file  . Food insecurity:    Worry: Not on file  Inability: Not on file  . Transportation needs:    Medical: Not on file    Non-medical: Not on file  Tobacco Use  . Smoking status: Former Smoker    Years: 0.00    Last attempt to quit: 03/03/1994    Years since quitting: 23.9  . Smokeless tobacco: Never Used  Substance and Sexual Activity  . Alcohol use: No  . Drug use: No  . Sexual activity: Not on file  Lifestyle  . Physical activity:    Days per week: Not on file    Minutes per session: Not on file  . Stress: Not on file  Relationships  . Social connections:    Talks on phone: Not on file    Gets together: Not on file    Attends religious service: Not on file    Active member of club or organization: Not on file    Attends meetings of clubs or organizations: Not on file    Relationship status: Not on file  . Intimate partner violence:    Fear of current or ex partner: Not on file    Emotionally abused: Not on file    Physically abused: Not on file    Forced sexual activity: Not on file  Other Topics Concern  . Not on file  Social History Narrative    Lives with grandson.  Widowed.  Has 2 sons and 3 daughters    FAMILY HISTORY:  Family History  Problem Relation Age of Onset  . Heart attack Mother        had multiple health problems  . Lung cancer Father 11       smoker  . Prostate cancer Brother   . Lung cancer Brother        dx. 32s; smoker  . Throat cancer Brother        dx. 41s; smoker  . Diabetes Sister        has 3 living sisters with multiple health problems  . Hypertension Son   . Hypertension Daughter   . Breast cancer Sister 59  . Breast cancer Sister        dx. 30s  . Breast cancer Maternal Grandmother        dx. 46s  . Breast cancer Maternal Aunt        dx. older than 2  . Dementia Maternal Aunt   . Breast cancer Cousin        dx. 42s  . Hypertension Brother     CURRENT MEDICATIONS:  Outpatient Encounter Medications as of 02/15/2018  Medication Sig Note  . acetaminophen (TYLENOL) 500 MG tablet Take 1,000 mg by mouth every 6 (six) hours as needed for mild pain or headache.   . albuterol (PROVENTIL HFA;VENTOLIN HFA) 108 (90 BASE) MCG/ACT inhaler Inhale 2 puffs into the lungs every 4 (four) hours as needed for shortness of breath.   . anastrozole (ARIMIDEX) 1 MG tablet Take 1 tablet (1 mg total) by mouth daily.   Marland Kitchen aspirin EC 81 MG tablet Take 81 mg by mouth daily.   Marland Kitchen atorvastatin (LIPITOR) 20 MG tablet Take 20 mg by mouth every morning.    . Blood Glucose Monitoring Suppl (BLOOD GLUCOSE MONITOR SYSTEM) w/Device KIT With lancets and strips #100 6rf Dx: E11.9 Test bid   . cholecalciferol (VITAMIN D) 1000 units tablet Take 2,000 Units by mouth daily.   Marland Kitchen doxycycline (DORYX) 100 MG EC tablet Take 100 mg by mouth 2 (two) times daily.   Marland Kitchen  ferrous sulfate 325 (65 FE) MG tablet Take 325 mg by mouth 3 (three) times daily with meals.   . fluconazole (DIFLUCAN) 150 MG tablet    . furosemide (LASIX) 20 MG tablet Take 20 mg by mouth 3 (three) times a week.   Marland Kitchen glipiZIDE (GLUCOTROL XL) 10 MG 24 hr tablet Take by mouth.     Marland Kitchen HYDROcodone-acetaminophen (NORCO/VICODIN) 5-325 MG tablet Take 1-2 tablets by mouth every 6 (six) hours as needed.   Marland Kitchen KLOR-CON M20 20 MEQ tablet TAKE 1 TABLET (20 MEQ TOTAL) BY MOUTH DAILY.   Marland Kitchen losartan (COZAAR) 100 MG tablet Take 100 mg by mouth daily.   . metFORMIN (GLUCOPHAGE-XR) 500 MG 24 hr tablet TAKE ONE TABLET BY MOUTH TWICE DAILY   . metoprolol succinate (TOPROL-XL) 100 MG 24 hr tablet TAKE ONE (1) TABLET EACH DAY 11/15/2015: Received from: [place]  . Misc. Devices MISC Underpad's 6 Bags/15-90 per month  Incontinence Pads 3 Bags/20-60 per month  2 Box of Gloves per month  40 box of gauze  20 rolls of tape  Nutrition supplement/30 day supply   . pantoprazole (PROTONIX) 40 MG tablet TAKE 1 TABLET (40 MG TOTAL) BY MOUTH DAILY.   Marland Kitchen polyethylene glycol (MIRALAX / GLYCOLAX) packet Take 17 g by mouth daily as needed for mild constipation.   . sodium chloride irrigation 0.9 % irrigation USE AS DIRECTED   . sodium chloride irrigation 0.9 % irrigation    . verapamil (CALAN-SR) 120 MG CR tablet Take 120 mg by mouth daily.   . [DISCONTINUED] celecoxib (CELEBREX) 200 MG capsule TAKE ONE CAPSULE BY MOUTH TWICE A DAY   . [DISCONTINUED] celecoxib (CELEBREX) 200 MG capsule    . [DISCONTINUED] lidocaine-prilocaine (EMLA) cream APPLY A QUARTER SIXE AMOUNT TO PORT SITE 1 HOUR PRIO TO CHEMO. DO NOT RUB IN. COVER W/ PLASTIC WRAP   . [DISCONTINUED] meloxicam (MOBIC) 15 MG tablet Take 1 tablet (15 mg total) by mouth daily as needed for pain.   . [DISCONTINUED] meloxicam (MOBIC) 7.5 MG tablet     No facility-administered encounter medications on file as of 02/15/2018.     ALLERGIES:  Allergies  Allergen Reactions  . Lisinopril Swelling and Cough    Mouth swelling  . Penicillins Nausea And Vomiting and Rash     PHYSICAL EXAM:  ECOG Performance status: 1  Vitals:   02/15/18 1154  BP: (!) 179/76  Pulse: 73  Resp: 20  Temp: 97.9 F (36.6 C)  SpO2: 95%   Filed Weights    02/15/18 1154  Weight: 287 lb (130.2 kg)    Physical Exam Constitutional:      Appearance: Normal appearance. She is obese.  Cardiovascular:     Rate and Rhythm: Normal rate and regular rhythm.     Heart sounds: Normal heart sounds.  Pulmonary:     Effort: Pulmonary effort is normal.     Breath sounds: Normal breath sounds.  Musculoskeletal:     Comments: Walker or wheelchair  Skin:    General: Skin is warm and dry.  Neurological:     Mental Status: She is alert and oriented to person, place, and time. Mental status is at baseline.  Psychiatric:        Mood and Affect: Mood normal.        Behavior: Behavior normal.        Thought Content: Thought content normal.        Judgment: Judgment normal.      LABORATORY DATA:  I have reviewed the labs as listed.  CBC    Component Value Date/Time   WBC 5.4 02/15/2018 1105   RBC 4.02 02/15/2018 1105   HGB 10.1 (L) 02/15/2018 1105   HCT 34.7 (L) 02/15/2018 1105   PLT 240 02/15/2018 1105   MCV 86.3 02/15/2018 1105   MCH 25.1 (L) 02/15/2018 1105   MCHC 29.1 (L) 02/15/2018 1105   RDW 16.1 (H) 02/15/2018 1105   LYMPHSABS 1.7 02/15/2018 1105   MONOABS 0.3 02/15/2018 1105   EOSABS 0.2 02/15/2018 1105   BASOSABS 0.0 02/15/2018 1105   CMP Latest Ref Rng & Units 02/15/2018 09/18/2017 08/14/2017  Glucose 70 - 99 mg/dL 113(H) 183(H) 103(H)  BUN 8 - 23 mg/dL '13 14 17  '$ Creatinine 0.44 - 1.00 mg/dL 0.76 0.90 0.77  Sodium 135 - 145 mmol/L 141 139 141  Potassium 3.5 - 5.1 mmol/L 4.0 3.8 3.7  Chloride 98 - 111 mmol/L 104 101 102  CO2 22 - 32 mmol/L '28 28 30  '$ Calcium 8.9 - 10.3 mg/dL 9.3 9.1 9.3  Total Protein 6.5 - 8.1 g/dL 7.8 7.8 8.0  Total Bilirubin 0.3 - 1.2 mg/dL 0.6 0.6 0.5  Alkaline Phos 38 - 126 U/L 80 100 90  AST 15 - 41 U/L '15 23 23  '$ ALT 0 - 44 U/L '17 25 27       '$ DIAGNOSTIC IMAGING:  I have independently reviewed the scans and discussed with the patient.   I have reviewed Kristy Finders, NP's note and agree with the  documentation.  I personally performed a face-to-face visit, made revisions and my assessment and plan is as follows.    ASSESSMENT & PLAN:   Breast cancer of upper-outer quadrant of left female breast ([place]) 1. 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  S/P left lumpectomy on 12/25/2014 followed by adjuvant chemotherapy consisting of Abraxane/Herceptin (02/01/2015- 04/19/2015).  She then underwent XRT by Dr. Lisbeth Renshaw (05/08/2015- 06/22/2015) with continuation of Herceptin x 52 weeks finishing on 02/07/2016. -Arimidex started in May 2017, tolerates it well.  Heart flashes stable.  Arthritic pains in the lower extremities have been stable. -Today's examination shows no palpable masses in bilateral breast.  Left breast upper outer quadrant lumpectomy scar is unchanged.  No palpable adenopathy. - We reviewed the results of the mammogram dated 12/01/2017, showing BI-RADS 2. -We have also reviewed her blood work.  No evidence of recurrence at this time. -We will see her back in 6 months for follow-up.  2.  Osteopenia:  -DEXA scan on 06/12/2017 shows T score of -2.3 in the femoral neck. -DEXA scan in 2017 showed T score of -0.1. -She was started on Prolia on 09/18/2017.  She is tolerating it well. -She was advised to take calcium and vitamin D twice daily.  3.  Normocytic anemia: - She is progressively developing anemia.  Hemoglobin showed 10.1.  MCV is slightly trending down. -This is likely from iron deficiency.  She is taking 1 tablet of iron daily.  She was told to increase it to 3 tablets daily.  She is tolerating it very well. -I plan to repeat her ferritin and iron panel at next visit.       Orders placed this encounter:  Orders Placed This Encounter  Procedures  . CBC with Differential/Platelet  . Comprehensive metabolic panel  . Ferritin  . Iron and TIBC  . Folate  . Vitamin B12  . Lactate dehydrogenase  Derek Jack, MD [place] [phone]

## 2018-03-18 ENCOUNTER — Other Ambulatory Visit (HOSPITAL_COMMUNITY): Payer: Self-pay

## 2018-03-18 DIAGNOSIS — C50919 Malignant neoplasm of unspecified site of unspecified female breast: Secondary | ICD-10-CM

## 2018-03-18 DIAGNOSIS — C50412 Malignant neoplasm of upper-outer quadrant of left female breast: Secondary | ICD-10-CM

## 2018-03-18 DIAGNOSIS — M85852 Other specified disorders of bone density and structure, left thigh: Secondary | ICD-10-CM

## 2018-03-18 DIAGNOSIS — Z170 Estrogen receptor positive status [ER+]: Secondary | ICD-10-CM

## 2018-03-22 ENCOUNTER — Inpatient Hospital Stay (HOSPITAL_COMMUNITY): Payer: Medicare Other | Attending: Hematology

## 2018-03-22 ENCOUNTER — Encounter (HOSPITAL_COMMUNITY): Payer: Self-pay

## 2018-03-22 ENCOUNTER — Inpatient Hospital Stay (HOSPITAL_COMMUNITY): Payer: Medicare Other

## 2018-03-22 VITALS — BP 154/75 | HR 69 | Temp 97.2°F | Resp 18

## 2018-03-22 DIAGNOSIS — C50919 Malignant neoplasm of unspecified site of unspecified female breast: Secondary | ICD-10-CM

## 2018-03-22 DIAGNOSIS — M85852 Other specified disorders of bone density and structure, left thigh: Secondary | ICD-10-CM

## 2018-03-22 DIAGNOSIS — M8580 Other specified disorders of bone density and structure, unspecified site: Secondary | ICD-10-CM | POA: Insufficient documentation

## 2018-03-22 DIAGNOSIS — Z170 Estrogen receptor positive status [ER+]: Secondary | ICD-10-CM

## 2018-03-22 DIAGNOSIS — C50412 Malignant neoplasm of upper-outer quadrant of left female breast: Secondary | ICD-10-CM

## 2018-03-22 LAB — COMPREHENSIVE METABOLIC PANEL
ALK PHOS: 83 U/L (ref 38–126)
ALT: 20 U/L (ref 0–44)
ANION GAP: 9 (ref 5–15)
AST: 17 U/L (ref 15–41)
Albumin: 3.7 g/dL (ref 3.5–5.0)
BUN: 20 mg/dL (ref 8–23)
CO2: 27 mmol/L (ref 22–32)
CREATININE: 0.83 mg/dL (ref 0.44–1.00)
Calcium: 9.3 mg/dL (ref 8.9–10.3)
Chloride: 103 mmol/L (ref 98–111)
GFR calc non Af Amer: >60 mL/min (ref >60)
Glucose, Bld: 127 mg/dL — ABNORMAL HIGH (ref 70–99)
Potassium: 3.9 mmol/L (ref 3.5–5.1)
SODIUM: 139 mmol/L (ref 135–145)
TOTAL PROTEIN: 7.8 g/dL (ref 6.5–8.1)
Total Bilirubin: 0.5 mg/dL (ref 0.3–1.2)

## 2018-03-22 MED ORDER — DENOSUMAB 60 MG/ML ~~LOC~~ SOSY
60.0000 mg | PREFILLED_SYRINGE | Freq: Once | SUBCUTANEOUS | Status: AC
  Administered 2018-03-22: 60 mg via SUBCUTANEOUS
  Filled 2018-03-22: qty 1

## 2018-03-22 NOTE — Progress Notes (Signed)
Pt here today for Prolia injection. Pt given injection in right arm. Pt tolerated injection well with no complaints. Pt stable and discharged home via wheelchair with her daughter. Pt to return in 6 months for next Prolia injection.

## 2018-05-26 ENCOUNTER — Other Ambulatory Visit (HOSPITAL_COMMUNITY): Payer: Self-pay | Admitting: Hematology

## 2018-05-26 DIAGNOSIS — C50412 Malignant neoplasm of upper-outer quadrant of left female breast: Secondary | ICD-10-CM

## 2018-09-21 ENCOUNTER — Other Ambulatory Visit: Payer: Self-pay

## 2018-09-21 ENCOUNTER — Other Ambulatory Visit (HOSPITAL_COMMUNITY): Payer: Self-pay | Admitting: Hematology

## 2018-09-21 ENCOUNTER — Inpatient Hospital Stay (HOSPITAL_COMMUNITY): Payer: Medicare Other | Attending: Hematology

## 2018-09-21 ENCOUNTER — Inpatient Hospital Stay (HOSPITAL_BASED_OUTPATIENT_CLINIC_OR_DEPARTMENT_OTHER): Payer: Medicare Other | Admitting: Hematology

## 2018-09-21 ENCOUNTER — Inpatient Hospital Stay (HOSPITAL_COMMUNITY): Payer: Medicare Other

## 2018-09-21 ENCOUNTER — Encounter (HOSPITAL_COMMUNITY): Payer: Self-pay | Admitting: Hematology

## 2018-09-21 VITALS — BP 139/84 | HR 74 | Temp 97.9°F | Resp 16 | Wt 285.0 lb

## 2018-09-21 DIAGNOSIS — M545 Low back pain: Secondary | ICD-10-CM

## 2018-09-21 DIAGNOSIS — M7989 Other specified soft tissue disorders: Secondary | ICD-10-CM

## 2018-09-21 DIAGNOSIS — C50412 Malignant neoplasm of upper-outer quadrant of left female breast: Secondary | ICD-10-CM | POA: Diagnosis not present

## 2018-09-21 DIAGNOSIS — C50919 Malignant neoplasm of unspecified site of unspecified female breast: Secondary | ICD-10-CM

## 2018-09-21 DIAGNOSIS — R0609 Other forms of dyspnea: Secondary | ICD-10-CM

## 2018-09-21 DIAGNOSIS — N951 Menopausal and female climacteric states: Secondary | ICD-10-CM

## 2018-09-21 DIAGNOSIS — M85852 Other specified disorders of bone density and structure, left thigh: Secondary | ICD-10-CM

## 2018-09-21 DIAGNOSIS — D649 Anemia, unspecified: Secondary | ICD-10-CM | POA: Insufficient documentation

## 2018-09-21 DIAGNOSIS — M8588 Other specified disorders of bone density and structure, other site: Secondary | ICD-10-CM | POA: Insufficient documentation

## 2018-09-21 DIAGNOSIS — Z170 Estrogen receptor positive status [ER+]: Secondary | ICD-10-CM

## 2018-09-21 DIAGNOSIS — M79606 Pain in leg, unspecified: Secondary | ICD-10-CM

## 2018-09-21 DIAGNOSIS — Z87891 Personal history of nicotine dependence: Secondary | ICD-10-CM

## 2018-09-21 LAB — LACTATE DEHYDROGENASE: LDH: 120 U/L (ref 98–192)

## 2018-09-21 LAB — COMPREHENSIVE METABOLIC PANEL
ALT: 17 U/L (ref 0–44)
AST: 14 U/L — ABNORMAL LOW (ref 15–41)
Albumin: 3.6 g/dL (ref 3.5–5.0)
Alkaline Phosphatase: 89 U/L (ref 38–126)
Anion gap: 11 (ref 5–15)
BUN: 11 mg/dL (ref 8–23)
CO2: 27 mmol/L (ref 22–32)
Calcium: 9.2 mg/dL (ref 8.9–10.3)
Chloride: 102 mmol/L (ref 98–111)
Creatinine, Ser: 0.77 mg/dL (ref 0.44–1.00)
GFR calc Af Amer: >60 mL/min (ref >60)
GFR calc non Af Amer: >60 mL/min (ref >60)
Glucose, Bld: 151 mg/dL — ABNORMAL HIGH (ref 70–99)
Potassium: 3.6 mmol/L (ref 3.5–5.1)
Sodium: 140 mmol/L (ref 135–145)
Total Bilirubin: 0.4 mg/dL (ref 0.3–1.2)
Total Protein: 8 g/dL (ref 6.5–8.1)

## 2018-09-21 LAB — CBC WITH DIFFERENTIAL/PLATELET
Abs Immature Granulocytes: 0.03 10*3/uL (ref 0.00–0.07)
Basophils Absolute: 0 10*3/uL (ref 0.0–0.1)
Basophils Relative: 0 %
Eosinophils Absolute: 0.3 10*3/uL (ref 0.0–0.5)
Eosinophils Relative: 4 %
HCT: 34.3 % — ABNORMAL LOW (ref 36.0–46.0)
Hemoglobin: 10.3 g/dL — ABNORMAL LOW (ref 12.0–15.0)
Immature Granulocytes: 0 %
Lymphocytes Relative: 25 %
Lymphs Abs: 1.7 10*3/uL (ref 0.7–4.0)
MCH: 26.4 pg (ref 26.0–34.0)
MCHC: 30 g/dL (ref 30.0–36.0)
MCV: 87.9 fL (ref 80.0–100.0)
Monocytes Absolute: 0.4 10*3/uL (ref 0.1–1.0)
Monocytes Relative: 6 %
Neutro Abs: 4.4 10*3/uL (ref 1.7–7.7)
Neutrophils Relative %: 65 %
Platelets: 281 10*3/uL (ref 150–400)
RBC: 3.9 MIL/uL (ref 3.87–5.11)
RDW: 15.1 % (ref 11.5–15.5)
WBC: 6.8 10*3/uL (ref 4.0–10.5)
nRBC: 0 % (ref 0.0–0.2)

## 2018-09-21 LAB — FERRITIN: Ferritin: 211 ng/mL (ref 11–307)

## 2018-09-21 LAB — IRON AND TIBC
Iron: 26 ug/dL — ABNORMAL LOW (ref 28–170)
Saturation Ratios: 10 % — ABNORMAL LOW (ref 10.4–31.8)
TIBC: 273 ug/dL (ref 250–450)
UIBC: 247 ug/dL

## 2018-09-21 LAB — VITAMIN B12: Vitamin B-12: 550 pg/mL (ref 180–914)

## 2018-09-21 LAB — FOLATE: Folate: 9.7 ng/mL (ref >5.9)

## 2018-09-21 MED ORDER — DENOSUMAB 60 MG/ML ~~LOC~~ SOSY
PREFILLED_SYRINGE | SUBCUTANEOUS | Status: AC
  Filled 2018-09-21: qty 1

## 2018-09-21 MED ORDER — DENOSUMAB 60 MG/ML ~~LOC~~ SOSY
60.0000 mg | PREFILLED_SYRINGE | Freq: Once | SUBCUTANEOUS | Status: AC
  Administered 2018-09-21: 60 mg via SUBCUTANEOUS

## 2018-09-21 NOTE — Patient Instructions (Signed)
[place] at [facility]  Discharge Instructions:  Prolia injection received today _______________________________________________________________  Thank you for choosing [place] at [facility] to provide your oncology and hematology care.  To afford each patient quality time with our providers, please arrive at least 15 minutes before your scheduled appointment.  You need to re-schedule your appointment if you arrive 10 or more minutes late.  We strive to give you quality time with our providers, and arriving late affects you and other patients whose appointments are after yours.  Also, if you no show three or more times for appointments you may be dismissed from the clinic.  Again, thank you for choosing [place] at [facility] hope is that these requests will allow you access to exceptional care and in a timely manner. _______________________________________________________________  If you have questions after your visit, please contact our office at [phone] between the hours of 8:30 a.m. and 5:00 p.m. Voicemails left after 4:30 p.m. will not be returned until the following business day. _______________________________________________________________  For prescription refill requests, have your pharmacy contact our office. _______________________________________________________________  Recommendations made by the consultant and any test results will be sent to your referring physician. _______________________________________________________________

## 2018-09-21 NOTE — Progress Notes (Signed)
Kristy Harris presents today for Prolia injection. Lab work reviewed prior to administration. See MAR for details. Discharged via wheelchair in satisfactory condition.

## 2018-09-21 NOTE — Patient Instructions (Addendum)
[facility] at [facility] Discharge Instructions  You were seen today by Dr. Katragadda. He went over your recent lab results. He will see you back in 4 months for labs and follow up.   Thank you for choosing [facility] at [facility] to provide your oncology and hematology care.  To afford each patient quality time with our provider, please arrive at least 15 minutes before your scheduled appointment time.   If you have a lab appointment with the Cancer Center please come in thru the  Main Entrance and check in at the main information desk  You need to re-schedule your appointment should you arrive 10 or more minutes late.  We strive to give you quality time with our providers, and arriving late affects you and other patients whose appointments are after yours.  Also, if you no show three or more times for appointments you may be dismissed from the clinic at the providers discretion.     Again, thank you for choosing [facility].  Our hope is that these requests will decrease the amount of time that you wait before being seen by our physicians.       _____________________________________________________________  Should you have questions after your visit to  Cancer Center, please contact our office at [phone] between the hours of 8:00 a.m. and 4:30 p.m.  Voicemails left after 4:00 p.m. will not be returned until the following business day.  For prescription refill requests, have your pharmacy contact our office and allow 72 hours.    Cancer Center Support Programs:   > Cancer Support Group  2nd Tuesday of the month 1pm-2pm, Journey Room    

## 2018-09-21 NOTE — Progress Notes (Signed)
Kristy Harris, [place] [address]   CLINIC:  Medical Oncology/Hematology  PCP:  Kristy Housekeeper, MD [place] [address] [phone]   REASON FOR VISIT: Follow-up of left breast Harris, osteopenia and anemia.  CURRENT THERAPY: Anastrozole Prolia and ferrous sulfate.  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 The estimated ejection fraction was in the range of 55% to 60%.    12/25/2014 Surgery   Left lumpectomy: IDC 1.7 cm, positive for LVI, with DCIS, 1/1 sentinel node positive deposit 1.9 cm with extracapsular extension, ER 90%, PR 90%, HER-2 positive ratio 2.4, Ki-67 29% T1 cN1 stage II a   02/01/2015 - 04/19/2015 Chemotherapy   Abraxane/Herceptin   03/20/2015 Echocardiogram   Systolic function wasnormal. The estimated ejection fraction was in the range of 60%to 65%.   05/08/2015 - 06/22/2015 Radiation Therapy   Dr. Lisbeth Harris    05/10/2015 - 02/07/2016 Antibody Plan   Herceptin every 21 days to complete 52 weeks worth of therapy.   06/13/2015 Echocardiogram   2D echo- The estimated ejection fraction was in the range of 60% to 65%. Diastolic function is abnormal, indeterminate grade. Wallmotion was normal.   06/28/2015 Imaging   Bone density- BMD as determined from Femur Total Left is 0.994 g/cm2 with a T-Score of -0.1. This patient is considered normal according to [facility]) criteria.      07/13/2015 -  Anti-estrogen oral therapy   Arimidex daily   09/12/2015 Echocardiogram   The estimated ejection fraction was in the range of 60% to 65%. Wall motion was normal; there were no regional wall motion abnormalities. Doppler parameters are consistent with abnormal L ventricular relaxation (grade 1 diastolic dysfunction).   12/04/2015 Echocardiogram   Left ventricle: The cavity size was normal. Wall thickness was   increased increased in a pattern of mild to moderate LVH.   Systolic function was normal. The estimated ejection fraction was   in the range of 60% to 65%. Wall motion was normal; there were no   regional wall motion abnormalities. Features are consistent with   a pseudonormal left ventricular filling pattern, with concomitant   abnormal relaxation and increased filling pressure (grade 2   diastolic dysfunction). Doppler parameters are consistent with   high ventricular filling pressure.   03/21/2016 Procedure   Port removed by Dr. Lucia Harris      Harris STAGING: Harris Staging Breast Harris of upper-outer quadrant of left female breast [facility]) Staging form: Breast, AJCC 7th Edition - Clinical stage from Harris: Stage IIA (T1c, N1, M0) - Signed by Kristy Harris    INTERVAL HISTORY:  [name] 73 y.o. female seen for follow-up of left breast Harris.  She is tolerating anastrozole reasonably well.  Minor hot flash are stable.  Leg swellings are stable.  Shortness of breath on exertion is also stable.  Appetite is 100%.  Energy levels are 50%.  Pain in the back and legs is stable at  8 out of 10.  Numbness in the hands is also stable.  Denies any worsening arthralgias.  Denies any recent ER visits or hospitalizations.  No new onset pains reported.  Denies any nausea, vomiting, diarrhea or constipation.  She is tolerating iron tablets 3 times a day very well.  She also takes calcium twice daily and vitamin D once daily.   REVIEW OF SYSTEMS:   Review of Systems  Respiratory: Positive for shortness of breath.   Cardiovascular: Positive for leg swelling.  Neurological: Positive for numbness.  All other systems reviewed and are negative.    PAST MEDICAL/SURGICAL HISTORY:  Past Medical History:  Diagnosis Date   Anemia    takes iron supplement   Arthritis    knees   COPD (chronic obstructive pulmonary disease) (HCC)    Enlarged heart    GERD (gastroesophageal reflux disease)    Hidradenitis suppurativa    buttocks   History of breast Harris 12/2014   Hyperlipidemia    Hypertension    states under control with meds., has been on med. > 40 yrs.   Morbid obesity ([place])    Non-insulin dependent type 2 diabetes mellitus ([place])    Shortness of breath dyspnea    with exertion   Urinary frequency    Past Surgical History:  Procedure Laterality Date   ABDOMINAL HYSTERECTOMY  25 yrs ago   partial   BLADDER SUSPENSION     x 2   BREAST LUMPECTOMY WITH RADIOACTIVE SEED AND SENTINEL LYMPH NODE BIOPSY Left 12/25/2014   Procedure: RADIOACTIVE SEED GIUDED LEFT BREAST LUMPECTOMY, LEFT AXILLARY SENTINEL LYMPH NODE BIOPSY;  Surgeon: Kristy Overall, MD;  Location: [place];  Service: General;  Laterality: Left;   CARDIAC CATHETERIZATION  02/21/2011   Normal coronary arteries, normal EF   COLONOSCOPY WITH PROPOFOL  11/18/2013   ESOPHAGOGASTRODUODENOSCOPY (EGD) WITH PROPOFOL  11/18/2013   EXCISION HYDRADENITIS LABIA N/A 12/08/2013   Procedure: EXCISION HIDRADENITIS PUBIC AREA;  Surgeon: Kristy Overall, MD;  Location: [facility];  Service: General;  Laterality: N/A;   HYDRADENITIS EXCISION Left 12/08/2013   Procedure: EXCISION HIDRADENITIS AXILLA;  Surgeon: Kristy Overall, MD;  Location: [facility];  Service: General;  Laterality: Left;   INCISION AND DRAINAGE ABSCESS  08/17/2008   perineum and buttock   IRRIGATION AND DEBRIDEMENT ABSCESS Right 12/23/2012   Procedure: incision  AND DEBRIDEMENT right buttock infection ;  Surgeon:  Shann Medal, MD;  Location: [facility];  Service: General;  Laterality: Right;   PILONIDAL CYST / SINUS EXCISION  06/04/2004   PILONIDAL CYST EXCISION     PORT-A-CATH REMOVAL Right 03/21/2016   Procedure: MINOR REMOVAL PORT-A-CATH;  Surgeon: Kristy Overall, MD;  Location: [place];  Service: General;  Laterality: Right;  MINOR REMOVAL PORT-A-CATH   PORTACATH PLACEMENT N/A 01/12/2015   Procedure: INSERTION PORT-A-CATH;  Surgeon: Kristy Overall, MD;  Location: [facility];  Service: General;  Laterality: N/A;   TONSILLECTOMY       SOCIAL HISTORY:  Social History   Socioeconomic History   Marital status: Widowed    Spouse name: Not on file   Number of children: 5   Years of education: Not on file   Highest education level: Not on file  Occupational History   Occupation: Textile work    Comment: Disabled  Scientist, product/process development strain: Not on file   Food insecurity    Worry: Not on file    Inability: Not on file   Transportation needs  Medical: Not on file    Non-medical: Not on file  Tobacco Use   Smoking status: Former Smoker    Years: 0.00    Quit date: 03/03/1994    Years since quitting: 24.5   Smokeless tobacco: Never Used  Substance and Sexual Activity   Alcohol use: No   Drug use: No   Sexual activity: Not on file  Lifestyle   Physical activity    Days per week: Not on file    Minutes per session: Not on file   Stress: Not on file  Relationships   Social connections    Talks on phone: Not on file    Gets together: Not on file    Attends religious service: Not on file    Active member of club or organization: Not on file    Attends meetings of clubs or organizations: Not on file    Relationship status: Not on file   Intimate partner violence    Fear of current or ex partner: Not on file    Emotionally abused: Not on file    Physically abused: Not on file    Forced sexual activity: Not on file  Other Topics Concern    Not on file  Social History Narrative   Lives with grandson.  Widowed.  Has 2 sons and 3 daughters    FAMILY HISTORY:  Family History  Problem Relation Age of Onset   Heart attack Mother        had multiple health problems   Lung Harris Father 8       smoker   Prostate Harris Brother    Lung Harris Brother        dx. 67s; smoker   Throat Harris Brother        dx. 50s; smoker   Diabetes Sister        has 63 living sisters with multiple health problems   Hypertension Son    Hypertension Daughter    Breast Harris Sister 70   Breast Harris Sister        dx. 50s   Breast Harris Maternal Grandmother        dx. 70s   Breast Harris Maternal Aunt        dx. older than 50   Dementia Maternal Aunt    Breast Harris Cousin        dx. 10s   Hypertension Brother     CURRENT MEDICATIONS:  Outpatient Encounter Medications as of 09/21/2018  Medication Sig Note   Calcium 150 MG TABS Take 1 tablet by mouth daily. Pt has not taken in 2 weeks since she is getting prolia injection today.    acetaminophen (TYLENOL) 500 MG tablet Take 1,000 mg by mouth every 6 (six) hours as needed for mild pain or headache.    albuterol (PROVENTIL HFA;VENTOLIN HFA) 108 (90 BASE) MCG/ACT inhaler Inhale 2 puffs into the lungs every 4 (four) hours as needed for shortness of breath.    anastrozole (ARIMIDEX) 1 MG tablet TAKE 1 TABLET BY MOUTH EVERY DAY    aspirin EC 81 MG tablet Take 81 mg by mouth daily.    atorvastatin (LIPITOR) 20 MG tablet Take 20 mg by mouth every morning.     Blood Glucose Monitoring Suppl (BLOOD GLUCOSE MONITOR SYSTEM) w/Device KIT With lancets and strips #100 6rf Dx: E11.9 Test bid    cholecalciferol (VITAMIN D) 1000 units tablet Take 2,000 Units by mouth daily.    doxycycline (DORYX) 100 MG EC  tablet Take 100 mg by mouth as needed.     ferrous sulfate 325 (65 FE) MG tablet Take 325 mg by mouth 3 (three) times daily with meals.    fluconazole (DIFLUCAN) 150 MG  tablet Take 150 mg by mouth as needed.     furosemide (LASIX) 20 MG tablet Take 20 mg by mouth 3 (three) times a week.    glipiZIDE (GLUCOTROL XL) 10 MG 24 hr tablet Take 10 mg by mouth daily with breakfast.     KLOR-CON M20 20 MEQ tablet TAKE 1 TABLET (20 MEQ TOTAL) BY MOUTH DAILY. (Patient not taking: Reported on 03/22/2018)    losartan (COZAAR) 100 MG tablet Take 100 mg by mouth daily.    metFORMIN (GLUCOPHAGE-XR) 500 MG 24 hr tablet TAKE ONE TABLET BY MOUTH TWICE DAILY    metoprolol succinate (TOPROL-XL) 100 MG 24 hr tablet TAKE ONE (1) TABLET EACH DAY 11/15/2015: Received from: [facility]. Devices MISC Underpad's 6 Bags/15-90 per month  Incontinence Pads 3 Bags/20-60 per month  2 Box of Gloves per month  40 box of gauze  20 rolls of tape  Nutrition supplement/30 day supply    pantoprazole (PROTONIX) 40 MG tablet TAKE 1 TABLET (40 MG TOTAL) BY MOUTH DAILY. (Patient taking differently: Take 40 mg by mouth as needed. )    polyethylene glycol (MIRALAX / GLYCOLAX) packet Take 17 g by mouth daily as needed for mild constipation. (Patient taking differently: Take 17 g by mouth as needed for mild constipation. )    sodium chloride irrigation 0.9 % irrigation USE AS DIRECTED    verapamil (CALAN-SR) 120 MG CR tablet Take 120 mg by mouth daily.    [DISCONTINUED] celecoxib (CELEBREX) 200 MG capsule TAKE ONE CAPSULE BY MOUTH TWICE A DAY    [DISCONTINUED] sodium chloride irrigation 0.9 % irrigation     No facility-administered encounter medications on file as of 09/21/2018.     ALLERGIES:  Allergies  Allergen Reactions   Lisinopril Swelling and Cough    Mouth swelling   Penicillins Nausea And Vomiting and Rash     PHYSICAL EXAM:  ECOG Performance status: 1  Vitals:   09/21/18 1124  BP: 139/84  Pulse: 74  Resp: 16  Temp: 97.9 F (36.6 C)  SpO2: 99%   Filed Weights   09/21/18 1124  Weight: 285 lb (129.3 kg)    Physical Exam Constitutional:       Appearance: Normal appearance. She is obese.  Cardiovascular:     Rate and Rhythm: Normal rate and regular rhythm.     Heart sounds: Normal heart sounds.  Pulmonary:     Effort: Pulmonary effort is normal.     Breath sounds: Normal breath sounds.  Abdominal:     General: There is no distension.     Palpations: Abdomen is soft. There is no mass.  Musculoskeletal:     Comments: Walker or wheelchair  Skin:    General: Skin is warm and dry.  Neurological:     Mental Status: She is alert and oriented to person, place, and time. Mental status is at baseline.  Psychiatric:        Mood and Affect: Mood normal.        Behavior: Behavior normal.    Left lumpectomy site is within normal limits.  No palpable masses in bilateral breasts.  No palpable adenopathy.  LABORATORY DATA:  I have reviewed the labs as listed.  CBC    Component Value Date/Time  WBC 6.8 09/21/2018 1042   RBC 3.90 09/21/2018 1042   HGB 10.3 (L) 09/21/2018 1042   HCT 34.3 (L) 09/21/2018 1042   PLT 281 09/21/2018 1042   MCV 87.9 09/21/2018 1042   MCH 26.4 09/21/2018 1042   MCHC 30.0 09/21/2018 1042   RDW 15.1 09/21/2018 1042   LYMPHSABS 1.7 09/21/2018 1042   MONOABS 0.4 09/21/2018 1042   EOSABS 0.3 09/21/2018 1042   BASOSABS 0.0 09/21/2018 1042   CMP Latest Ref Rng & Units 09/21/2018 03/22/2018 02/15/2018  Glucose 70 - 99 mg/dL 151(H) 127(H) 113(H)  BUN 8 - 23 mg/dL '11 20 13  '$ Creatinine 0.44 - 1.00 mg/dL 0.77 0.83 0.76  Sodium 135 - 145 mmol/L 140 139 141  Potassium 3.5 - 5.1 mmol/L 3.6 3.9 4.0  Chloride 98 - 111 mmol/L 102 103 104  CO2 22 - 32 mmol/L '27 27 28  '$ Calcium 8.9 - 10.3 mg/dL 9.2 9.3 9.3  Total Protein 6.5 - 8.1 g/dL 8.0 7.8 7.8  Total Bilirubin 0.3 - 1.2 mg/dL 0.4 0.5 0.6  Alkaline Phos 38 - 126 U/L 89 83 80  AST 15 - 41 U/L 14(L) 17 15  ALT 0 - 44 U/L '17 20 17       '$ DIAGNOSTIC IMAGING:  I have independently reviewed the scans and discussed with the patient.      ASSESSMENT & PLAN:    Breast Harris of upper-outer quadrant of left female breast ([place]) 1. 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  S/P left lumpectomy on 12/25/2014 followed by adjuvant chemotherapy consisting of Abraxane/Herceptin (02/01/2015- 04/19/2015).  She then underwent XRT by Dr. Lisbeth Harris (05/08/2015- 06/22/2015) with continuation of Herceptin x 52 weeks finishing on 02/07/2016. -Anastrozole started in May 2017, tolerating it very well.  Hot flashes minor or stable. -Today's examination did not reveal any palpable masses in bilateral breast.  Left breast upper outer quadrant lumpectomy scar is stable.  No palpable adenopathy. -Mammogram on 12/01/2017 is BI-RADS 2. - I reviewed her blood work which is grossly within normal limits.  We will schedule her for another mammogram in October.  I will see her back in 4 months for follow-up.  2.  Osteopenia: - DEXA scan on 06/12/2017 shows T score of -2.3 in the femoral neck.  Prior to that DEXA scan 2017 showed T score of -0.1. -Prolia started on 09/18/2017.  She is tolerating it very well. - She is continuing calcium and vitamin D.  3.  Normocytic anemia: -Labs reveal hemoglobin 10.3 today.  Ferritin is 211. -She is taking iron tablet 3 times a day.  She is tolerating it very well.  I plan to repeat labs in 4 months.   Total time spent is 25 minutes with more than 50% of the time spent face-to-face discussing treatment plan, counseling and coordination of care.    Orders placed this encounter:  Orders Placed This Encounter  Procedures   MM DIAG BREAST TOMO BILATERAL   CBC with Differential/Platelet   Comprehensive metabolic panel   Iron and TIBC   Ferritin   Vitamin B12   Folate      Derek Jack, MD [place] [phone]

## 2018-09-21 NOTE — Progress Notes (Signed)
Pt is taking Arimidex as prescribed with no side effects. 

## 2018-09-21 NOTE — Assessment & Plan Note (Signed)
1. 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  S/P left lumpectomy on 12/25/2014 followed by adjuvant chemotherapy consisting of Abraxane/Herceptin (02/01/2015- 04/19/2015).  She then underwent XRT by Dr. Lisbeth Renshaw (05/08/2015- 06/22/2015) with continuation of Herceptin x 52 weeks finishing on 02/07/2016. -Anastrozole started in May 2017, tolerating it very well.  Hot flashes minor or stable. -Today's examination did not reveal any palpable masses in bilateral breast.  Left breast upper outer quadrant lumpectomy scar is stable.  No palpable adenopathy. -Mammogram on 12/01/2017 is BI-RADS 2. - I reviewed her blood work which is grossly within normal limits.  We will schedule her for another mammogram in October.  I will see her back in 4 months for follow-up.  2.  Osteopenia: - DEXA scan on 06/12/2017 shows T score of -2.3 in the femoral neck.  Prior to that DEXA scan 2017 showed T score of -0.1. -Prolia started on 09/18/2017.  She is tolerating it very well. - She is continuing calcium and vitamin D.  3.  Normocytic anemia: -Labs reveal hemoglobin 10.3 today.  Ferritin is 211. -She is taking iron tablet 3 times a day.  She is tolerating it very well.  I plan to repeat labs in 4 months.

## 2018-11-02 ENCOUNTER — Observation Stay (HOSPITAL_COMMUNITY): Payer: Medicare Other

## 2018-11-02 ENCOUNTER — Inpatient Hospital Stay (HOSPITAL_COMMUNITY)
Admission: EM | Admit: 2018-11-02 | Discharge: 2018-11-06 | DRG: 603 | Disposition: A | Discharge status: Home Health | Payer: Medicare Other | Attending: Internal Medicine | Admitting: Internal Medicine

## 2018-11-02 ENCOUNTER — Other Ambulatory Visit: Payer: Self-pay

## 2018-11-02 ENCOUNTER — Encounter (HOSPITAL_COMMUNITY): Payer: Self-pay

## 2018-11-02 DIAGNOSIS — Z853 Personal history of malignant neoplasm of breast: Secondary | ICD-10-CM | POA: Diagnosis not present

## 2018-11-02 DIAGNOSIS — L8990 Pressure ulcer of unspecified site, unspecified stage: Secondary | ICD-10-CM | POA: Insufficient documentation

## 2018-11-02 DIAGNOSIS — L89322 Pressure ulcer of left buttock, stage 2: Secondary | ICD-10-CM | POA: Diagnosis present

## 2018-11-02 DIAGNOSIS — Z801 Family history of malignant neoplasm of trachea, bronchus and lung: Secondary | ICD-10-CM

## 2018-11-02 DIAGNOSIS — Z79899 Other long term (current) drug therapy: Secondary | ICD-10-CM

## 2018-11-02 DIAGNOSIS — Z79811 Long term (current) use of aromatase inhibitors: Secondary | ICD-10-CM

## 2018-11-02 DIAGNOSIS — Z803 Family history of malignant neoplasm of breast: Secondary | ICD-10-CM

## 2018-11-02 DIAGNOSIS — L89312 Pressure ulcer of right buttock, stage 2: Secondary | ICD-10-CM | POA: Diagnosis not present

## 2018-11-02 DIAGNOSIS — Z90710 Acquired absence of both cervix and uterus: Secondary | ICD-10-CM

## 2018-11-02 DIAGNOSIS — I350 Nonrheumatic aortic (valve) stenosis: Secondary | ICD-10-CM | POA: Diagnosis present

## 2018-11-02 DIAGNOSIS — E1151 Type 2 diabetes mellitus with diabetic peripheral angiopathy without gangrene: Secondary | ICD-10-CM | POA: Diagnosis not present

## 2018-11-02 DIAGNOSIS — E785 Hyperlipidemia, unspecified: Secondary | ICD-10-CM | POA: Diagnosis not present

## 2018-11-02 DIAGNOSIS — K219 Gastro-esophageal reflux disease without esophagitis: Secondary | ICD-10-CM | POA: Diagnosis not present

## 2018-11-02 DIAGNOSIS — J449 Chronic obstructive pulmonary disease, unspecified: Secondary | ICD-10-CM | POA: Diagnosis not present

## 2018-11-02 DIAGNOSIS — M60032 Infective myositis, left forearm: Secondary | ICD-10-CM | POA: Diagnosis not present

## 2018-11-02 DIAGNOSIS — Z8249 Family history of ischemic heart disease and other diseases of the circulatory system: Secondary | ICD-10-CM

## 2018-11-02 DIAGNOSIS — Z9221 Personal history of antineoplastic chemotherapy: Secondary | ICD-10-CM

## 2018-11-02 DIAGNOSIS — Z7984 Long term (current) use of oral hypoglycemic drugs: Secondary | ICD-10-CM | POA: Diagnosis not present

## 2018-11-02 DIAGNOSIS — Z6841 Body Mass Index (BMI) 40.0 and over, adult: Secondary | ICD-10-CM

## 2018-11-02 DIAGNOSIS — R7982 Elevated C-reactive protein (CRP): Secondary | ICD-10-CM | POA: Diagnosis not present

## 2018-11-02 DIAGNOSIS — Z880 Allergy status to penicillin: Secondary | ICD-10-CM

## 2018-11-02 DIAGNOSIS — M7989 Other specified soft tissue disorders: Secondary | ICD-10-CM | POA: Diagnosis not present

## 2018-11-02 DIAGNOSIS — I10 Essential (primary) hypertension: Secondary | ICD-10-CM | POA: Diagnosis present

## 2018-11-02 DIAGNOSIS — Z833 Family history of diabetes mellitus: Secondary | ICD-10-CM | POA: Diagnosis not present

## 2018-11-02 DIAGNOSIS — Z923 Personal history of irradiation: Secondary | ICD-10-CM

## 2018-11-02 DIAGNOSIS — Z87891 Personal history of nicotine dependence: Secondary | ICD-10-CM

## 2018-11-02 DIAGNOSIS — Z888 Allergy status to other drugs, medicaments and biological substances status: Secondary | ICD-10-CM

## 2018-11-02 DIAGNOSIS — L03114 Cellulitis of left upper limb: Secondary | ICD-10-CM | POA: Diagnosis not present

## 2018-11-02 DIAGNOSIS — Z20828 Contact with and (suspected) exposure to other viral communicable diseases: Secondary | ICD-10-CM | POA: Diagnosis present

## 2018-11-02 LAB — COMPREHENSIVE METABOLIC PANEL
ALT: 15 U/L (ref 0–44)
AST: 15 U/L (ref 15–41)
Albumin: 3.3 g/dL — ABNORMAL LOW (ref 3.5–5.0)
Alkaline Phosphatase: 84 U/L (ref 38–126)
Anion gap: 9 (ref 5–15)
BUN: 12 mg/dL (ref 8–23)
CO2: 27 mmol/L (ref 22–32)
Calcium: 8.5 mg/dL — ABNORMAL LOW (ref 8.9–10.3)
Chloride: 101 mmol/L (ref 98–111)
Creatinine, Ser: 0.63 mg/dL (ref 0.44–1.00)
GFR calc Af Amer: >60 mL/min (ref >60)
GFR calc non Af Amer: >60 mL/min (ref >60)
Glucose, Bld: 156 mg/dL — ABNORMAL HIGH (ref 70–99)
Potassium: 3.5 mmol/L (ref 3.5–5.1)
Sodium: 137 mmol/L (ref 135–145)
Total Bilirubin: 0.3 mg/dL (ref 0.3–1.2)
Total Protein: 7.9 g/dL (ref 6.5–8.1)

## 2018-11-02 LAB — CBC WITH DIFFERENTIAL/PLATELET
Abs Immature Granulocytes: 0.02 10*3/uL (ref 0.00–0.07)
Basophils Absolute: 0 10*3/uL (ref 0.0–0.1)
Basophils Relative: 0 %
Eosinophils Absolute: 0.2 10*3/uL (ref 0.0–0.5)
Eosinophils Relative: 2 %
HCT: 35.6 % — ABNORMAL LOW (ref 36.0–46.0)
Hemoglobin: 10.7 g/dL — ABNORMAL LOW (ref 12.0–15.0)
Immature Granulocytes: 0 %
Lymphocytes Relative: 20 %
Lymphs Abs: 1.6 10*3/uL (ref 0.7–4.0)
MCH: 26.2 pg (ref 26.0–34.0)
MCHC: 30.1 g/dL (ref 30.0–36.0)
MCV: 87.3 fL (ref 80.0–100.0)
Monocytes Absolute: 0.6 10*3/uL (ref 0.1–1.0)
Monocytes Relative: 7 %
Neutro Abs: 5.7 10*3/uL (ref 1.7–7.7)
Neutrophils Relative %: 71 %
Platelets: 290 10*3/uL (ref 150–400)
RBC: 4.08 MIL/uL (ref 3.87–5.11)
RDW: 15.1 % (ref 11.5–15.5)
WBC: 8.1 10*3/uL (ref 4.0–10.5)
nRBC: 0 % (ref 0.0–0.2)

## 2018-11-02 LAB — SEDIMENTATION RATE: Sed Rate: 69 mm/hr — ABNORMAL HIGH (ref 0–22)

## 2018-11-02 LAB — SYNOVIAL FLUID, CRYSTAL: Crystals, Fluid: NONE SEEN

## 2018-11-02 LAB — C-REACTIVE PROTEIN: CRP: 23.1 mg/dL — ABNORMAL HIGH (ref <1.0)

## 2018-11-02 LAB — SARS CORONAVIRUS 2 BY RT PCR (HOSPITAL ORDER, PERFORMED IN ~~LOC~~ HOSPITAL LAB): SARS Coronavirus 2: NEGATIVE

## 2018-11-02 MED ORDER — LOSARTAN POTASSIUM 50 MG PO TABS
100.0000 mg | ORAL_TABLET | Freq: Every day | ORAL | Status: DC
  Administered 2018-11-03 – 2018-11-06 (×4): 100 mg via ORAL
  Filled 2018-11-02 (×4): qty 2

## 2018-11-02 MED ORDER — ACETAMINOPHEN 325 MG PO TABS: 650.0000 mg | ORAL_TABLET | Freq: Four times a day (QID) | ORAL | Status: DC | PRN

## 2018-11-02 MED ORDER — MORPHINE SULFATE (PF) 4 MG/ML IV SOLN
4.0000 mg | INTRAVENOUS | Status: DC | PRN
  Administered 2018-11-02 – 2018-11-06 (×13): 4 mg via INTRAVENOUS
  Filled 2018-11-02 (×13): qty 1

## 2018-11-02 MED ORDER — ANASTROZOLE 1 MG PO TABS
1.0000 mg | ORAL_TABLET | Freq: Every day | ORAL | Status: DC
  Administered 2018-11-03 – 2018-11-06 (×4): 1 mg via ORAL
  Filled 2018-11-02 (×4): qty 1

## 2018-11-02 MED ORDER — METOPROLOL SUCCINATE ER 50 MG PO TB24
100.0000 mg | ORAL_TABLET | Freq: Every day | ORAL | Status: DC
  Administered 2018-11-03 – 2018-11-06 (×4): 100 mg via ORAL
  Filled 2018-11-02 (×4): qty 2

## 2018-11-02 MED ORDER — GLIPIZIDE ER 10 MG PO TB24
10.0000 mg | ORAL_TABLET | Freq: Every day | ORAL | Status: DC
  Administered 2018-11-03 – 2018-11-06 (×4): 10 mg via ORAL
  Filled 2018-11-02 (×5): qty 1

## 2018-11-02 MED ORDER — ONDANSETRON HCL 4 MG PO TABS: 4.0000 mg | ORAL_TABLET | Freq: Four times a day (QID) | ORAL | Status: DC | PRN

## 2018-11-02 MED ORDER — VERAPAMIL HCL ER 120 MG PO TBCR
120.0000 mg | EXTENDED_RELEASE_TABLET | Freq: Every day | ORAL | Status: DC
  Administered 2018-11-03 – 2018-11-06 (×4): 120 mg via ORAL
  Filled 2018-11-02 (×4): qty 1

## 2018-11-02 MED ORDER — ACETAMINOPHEN 650 MG RE SUPP: 650.0000 mg | Freq: Four times a day (QID) | RECTAL | Status: DC | PRN

## 2018-11-02 MED ORDER — ONDANSETRON HCL 4 MG/2ML IJ SOLN: 4.0000 mg | Freq: Four times a day (QID) | INTRAMUSCULAR | Status: DC | PRN

## 2018-11-02 MED ORDER — MORPHINE SULFATE (PF) 4 MG/ML IV SOLN
4.0000 mg | Freq: Once | INTRAVENOUS | Status: AC
  Administered 2018-11-02: 4 mg via INTRAVENOUS
  Filled 2018-11-02: qty 1

## 2018-11-02 MED ORDER — ATORVASTATIN CALCIUM 10 MG PO TABS
20.0000 mg | ORAL_TABLET | Freq: Every morning | ORAL | Status: DC
  Administered 2018-11-03 – 2018-11-06 (×4): 20 mg via ORAL
  Filled 2018-11-02 (×4): qty 2

## 2018-11-02 MED ORDER — FUROSEMIDE 20 MG PO TABS: 20.0000 mg | ORAL_TABLET | ORAL | Status: DC

## 2018-11-02 MED ORDER — CLINDAMYCIN PHOSPHATE 600 MG/50ML IV SOLN
600.0000 mg | Freq: Once | INTRAVENOUS | Status: AC
  Administered 2018-11-02: 600 mg via INTRAVENOUS
  Filled 2018-11-02: qty 50

## 2018-11-02 MED ORDER — VANCOMYCIN HCL 10 G IV SOLR
1250.0000 mg | Freq: Two times a day (BID) | INTRAVENOUS | Status: DC
  Administered 2018-11-03 – 2018-11-06 (×7): 1250 mg via INTRAVENOUS
  Filled 2018-11-02 (×9): qty 1250

## 2018-11-02 MED ORDER — LIDOCAINE-EPINEPHRINE (PF) 1 %-1:200000 IJ SOLN
20.0000 mL | Freq: Once | INTRAMUSCULAR | Status: AC
  Administered 2018-11-02: 20 mL
  Filled 2018-11-02: qty 30

## 2018-11-02 MED ORDER — POLYETHYLENE GLYCOL 3350 17 G PO PACK: 17.0000 g | PACK | Freq: Every day | ORAL | Status: DC | PRN

## 2018-11-02 NOTE — Progress Notes (Signed)
Pharmacy Antibiotic Note  Kristy Harris is a 73 y.o. female admitted on 11/02/2018 with L hand infection. Tm 99.1 WBC wnl Cr stable 0.6. Pharmacy has been consulted for vancomycin dosing.  Plan: Vancomycin 1250mg  q12h  Est AUC 533  Height: 5\' 4"  (162.6 cm) Weight: 280 lb (127 kg) IBW/kg (Calculated) : 54.7  Temp (24hrs), Avg:98.8 F (37.1 C), Min:98.4 F (36.9 C), Max:99.1 F (37.3 C)  Recent Labs  Lab 11/02/18 1304  WBC 8.1  CREATININE 0.63    Estimated Creatinine Clearance: 86.4 mL/min (by C-G formula based on SCr of 0.63 mg/dL).    Allergies  Allergen Reactions  . Lisinopril Swelling and Cough    Mouth swelling  . Penicillins Nausea And Vomiting and Rash    .Did it involve swelling of the face/tongue/throat, SOB, or low BP? Yes Did it involve sudden or severe rash/hives, skin peeling, or any reaction on the inside of your mouth or nose? Yes Did you need to seek medical attention at a hospital or doctor's office? Yes When did it last happen? If all above answers are "NO", may proceed with cephalosporin use.     Antimicrobials this admission: Clindamycin x1 in ED 9/1  Dose adjustments this admission:   Microbiology results:   Bonnita Nasuti Pharm.D. CPP, BCPS Clinical Pharmacist [phone] 11/02/2018 9:52 PM

## 2018-11-02 NOTE — ED Provider Notes (Signed)
This patient is a 73 year old female who presents with left hand swelling and pain which is been going on for at least 6 days.  She denies any fevers but states that the swelling and pain is been rather progressive, persistent and worse with any range of motion or touching of the hand.  No fever, no significant redness however on my exam there is an increased hue to the dorsum of the hand, she is unable to extend or flex her fingers but keeps it in a very neutral position.  With any palpation of the hand or wrist she has tenderness, it is more focused around the wrist.  A bedside ultrasound does reveal fluid surrounding the extensor tendons, no definite deep pockets of fluid.  I assisted with arthrocentesis without any significant return of fluid.  Please see the procedure note from the physician assistant [place]  The patient has abnormal lab work, will need to discuss with orthopedics about treating versus supportive care for a inflammatory arthritis, there is not enough fluid sent to lab to evaluate for cell counts however a Gram stain was sent.  Hand consultation requested  Medical screening examination/treatment/procedure(s) were conducted as a shared visit with non-physician practitioner(s) and myself.  I personally evaluated the patient during the encounter.  Clinical Impression:   Final diagnoses:  Cellulitis of left upper extremity  Swelling of left hand         Noemi Chapel, MD 11/03/18 [phone]

## 2018-11-02 NOTE — Progress Notes (Signed)
Orthopedic Tech Progress Note Patient Details:  Kristy Harris 1945-03-31 [id number]  Ortho Devices Type of Ortho Device: Sling arm elevator Ortho Device/Splint Location: lue. applied at drs request. Ortho Device/Splint Interventions: Ordered, Application, Adjustment   Post Interventions Patient Tolerated: Well Instructions Provided: Care of device, Adjustment of device   [name] 11/02/2018, 10:23 PM

## 2018-11-02 NOTE — H&P (Addendum)
History and Physical    Kristy Harris ZPH:[id number] DOB: Jun 30, 1945 DOA: 11/02/2018  PCP: Bridget Hartshorn, NP   Patient coming from: Home  I have personally briefly reviewed patient's old medical records in [place]  Chief Complaint: Left hand swelling and pain.  HPI: Kristy Harris is a 73 y.o. female with medical history significant for diabetes mellitus, hypertension, hydradenitis 2.  Fever, COPD, who presented to ED with complaints of swelling of her left hand of 6 days duration.  Swelling is accompanied by severe pain.  At baseline she is not able to fully flex her fingers but now this is significantly worse due to pain and swelling.  She denies trauma.  She denies history of gout.  No fever no chills.  She has arthritis involving her lower extremities, but no swelling of her other joints.  No rash.  ED Course: Stable vitals.  WBC 8.1.  CRP elevated 23, ESR 69. EDP talked to hand surgeon on call at [facility], Dr. Amedeo Plenty, recommended transfer to [facility], IV antibiotics and MRI of the left hand.  Attempted arthrocentesis in the ED-no significant fluid withdrawn.  IV Clindamycin started in ED.  Review of Systems: As per HPI all other systems reviewed and negative.  Past Medical History:  Diagnosis Date  . Anemia    takes iron supplement  . Arthritis    knees  . COPD (chronic obstructive pulmonary disease) ([place])   . Enlarged heart   . GERD (gastroesophageal reflux disease)   . Hidradenitis suppurativa    buttocks  . History of breast cancer 12/2014  . Hyperlipidemia   . Hypertension    states under control with meds., has been on med. > 40 yrs.  . Morbid obesity ([place])   . Non-insulin dependent type 2 diabetes mellitus ([place])   . Shortness of breath dyspnea    with exertion  . Urinary frequency     Past Surgical History:  Procedure Laterality Date  . ABDOMINAL HYSTERECTOMY  25 yrs ago   partial  . BLADDER SUSPENSION     x 2  . BREAST LUMPECTOMY WITH  RADIOACTIVE SEED AND SENTINEL LYMPH NODE BIOPSY Left 12/25/2014   Procedure: RADIOACTIVE SEED GIUDED LEFT BREAST LUMPECTOMY, LEFT AXILLARY SENTINEL LYMPH NODE BIOPSY;  Surgeon: Alphonsa Overall, MD;  Location: [place];  Service: General;  Laterality: Left;  . CARDIAC CATHETERIZATION  02/21/2011   Normal coronary arteries, normal EF  . COLONOSCOPY WITH PROPOFOL  11/18/2013  . ESOPHAGOGASTRODUODENOSCOPY (EGD) WITH PROPOFOL  11/18/2013  . EXCISION HYDRADENITIS LABIA N/A 12/08/2013   Procedure: EXCISION HIDRADENITIS PUBIC AREA;  Surgeon: Alphonsa Overall, MD;  [facility];  Service: General;  Laterality: N/A;  . HYDRADENITIS EXCISION Left 12/08/2013   Procedure: EXCISION HIDRADENITIS AXILLA;  Surgeon: Alphonsa Overall, MD;  [facility];  Service: General;  Laterality: Left;  . INCISION AND DRAINAGE ABSCESS  08/17/2008   perineum and buttock  . IRRIGATION AND DEBRIDEMENT ABSCESS Right 12/23/2012   Procedure: incision  AND DEBRIDEMENT right buttock infection ;  Surgeon: Shann Medal, MD;  [facility];  Service: General;  Laterality: Right;  . PILONIDAL CYST / SINUS EXCISION  06/04/2004  . PILONIDAL CYST EXCISION    . PORT-A-CATH REMOVAL Right 03/21/2016   Procedure: MINOR REMOVAL PORT-A-CATH;  Surgeon: Alphonsa Overall, MD;  Location: [place];  Service: General;  Laterality: Right;  MINOR REMOVAL PORT-A-CATH  . PORTACATH PLACEMENT N/A 01/12/2015   Procedure: INSERTION PORT-A-CATH;  Surgeon: Shanon Brow  Lucia Gaskins, MD;  [facility];  Service: General;  Laterality: N/A;  . TONSILLECTOMY       reports that she quit smoking about 24 years ago. She quit after 0.00 years of use. She has never used smokeless tobacco. She reports that she does not drink alcohol or use drugs.  Allergies  Allergen Reactions  . Lisinopril Swelling and Cough    Mouth swelling  . Penicillins Nausea And Vomiting and Rash    .Did it involve swelling of the face/tongue/throat, SOB, or low BP? Yes Did it involve  sudden or severe rash/hives, skin peeling, or any reaction on the inside of your mouth or nose? Yes Did you need to seek medical attention at a hospital or doctor's office? Yes When did it last happen? If all above answers are "NO", may proceed with cephalosporin use.     Family History  Problem Relation Age of Onset  . Heart attack Mother        had multiple health problems  . Lung cancer Father 33       smoker  . Prostate cancer Brother   . Lung cancer Brother        dx. 68s; smoker  . Throat cancer Brother        dx. 37s; smoker  . Diabetes Sister        has 3 living sisters with multiple health problems  . Hypertension Son   . Hypertension Daughter   . Breast cancer Sister 15  . Breast cancer Sister        dx. 49s  . Breast cancer Maternal Grandmother        dx. 41s  . Breast cancer Maternal Aunt        dx. older than 49  . Dementia Maternal Aunt   . Breast cancer Cousin        dx. 3s  . Hypertension Brother     Prior to Admission medications   Medication Sig Start Date End Date Taking? Authorizing Provider  acetaminophen (TYLENOL) 500 MG tablet Take 1,000 mg by mouth every 6 (six) hours as needed for mild pain or headache.   Yes [provider]  albuterol (PROVENTIL HFA;VENTOLIN HFA) 108 (90 BASE) MCG/ACT inhaler Inhale 2 puffs into the lungs every 4 (four) hours as needed for shortness of breath.   Yes [provider]  anastrozole (ARIMIDEX) 1 MG tablet TAKE 1 TABLET BY MOUTH EVERY DAY Patient taking differently: Take 1 mg by mouth daily.  05/26/18  Yes Derek Jack, MD  atorvastatin (LIPITOR) 20 MG tablet Take 20 mg by mouth every morning.    Yes [provider]  Blood Glucose Monitoring Suppl (BLOOD GLUCOSE MONITOR SYSTEM) w/Device KIT With lancets and strips #100 6rf Dx: E11.9 Test bid 05/06/17  Yes [provider]  Calcium 150 MG TABS Take 1 tablet by mouth daily.  08/11/18  Yes [provider]   cholecalciferol (VITAMIN D) 1000 units tablet Take 2,000 Units by mouth daily.   Yes [provider]  doxycycline (DORYX) 100 MG EC tablet Take 100 mg by mouth as needed.    Yes [provider]  ferrous sulfate 325 (65 FE) MG tablet Take 325 mg by mouth 3 (three) times daily with meals.   Yes [provider]  fluconazole (DIFLUCAN) 150 MG tablet Take 150 mg by mouth as needed.  08/07/17  Yes [provider]  furosemide (LASIX) 20 MG tablet Take 20 mg by mouth 3 (three) times  a week.   Yes [provider]  glipiZIDE (GLUCOTROL XL) 10 MG 24 hr tablet Take 10 mg by mouth daily with breakfast.  02/18/17  Yes [provider]  ibuprofen (ADVIL) 200 MG tablet Take 200 mg by mouth every 6 (six) hours as needed.   Yes [provider]  losartan (COZAAR) 100 MG tablet Take 100 mg by mouth daily.   Yes [provider]  metFORMIN (GLUCOPHAGE-XR) 500 MG 24 hr tablet TAKE ONE TABLET BY MOUTH TWICE DAILY 03/24/16  Yes [provider]  metoprolol succinate (TOPROL-XL) 100 MG 24 hr tablet TAKE ONE (1) TABLET EACH DAY 10/25/15  Yes [provider]  [place]. Devices MISC Underpad's 6 Bags/15-90 per month  Incontinence Pads 3 Bags/20-60 per month  2 Box of Gloves per month  40 box of gauze  20 rolls of tape  Nutrition supplement/30 day supply 07/03/15  Yes [provider]  pantoprazole (PROTONIX) 40 MG tablet TAKE 1 TABLET (40 MG TOTAL) BY MOUTH DAILY. Patient taking differently: Take 40 mg by mouth as needed.  02/06/15  Yes Nandigam, Venia Minks, MD  polyethylene glycol (MIRALAX / GLYCOLAX) packet Take 17 g by mouth daily as needed for mild constipation. Patient taking differently: Take 17 g by mouth as needed for mild constipation.  05/30/14  Yes Robbie Lis, MD  sodium chloride irrigation 0.9 % irrigation USE AS DIRECTED 08/06/17  Yes [provider]  verapamil (CALAN-SR) 120 MG CR tablet Take 120 mg by mouth  daily. 12/30/14  Yes [provider]    Physical Exam: Vitals:   11/02/18 1545 11/02/18 1548 11/02/18 1856 11/02/18 2052  BP: (!) 151/62 (!) 151/62 (!) 148/78 (!) 148/68  Pulse: 77 79 81 90  Resp: 18  16   Temp:    98.4 F (36.9 C)  TempSrc:    Oral  SpO2: 93% 95% 98% 93%  Weight:      Height:        Constitutional: NAD, calm, comfortable Vitals:   11/02/18 1545 11/02/18 1548 11/02/18 1856 11/02/18 2052  BP: (!) 151/62 (!) 151/62 (!) 148/78 (!) 148/68  Pulse: 77 79 81 90  Resp: 18  16   Temp:    98.4 F (36.9 C)  TempSrc:    Oral  SpO2: 93% 95% 98% 93%  Weight:      Height:       Eyes: PERRL, lids and conjunctivae normal ENMT: Mucous membranes are moist. Posterior pharynx clear of any exudate or lesions.  Neck: normal, supple, no masses, no thyromegaly Respiratory: clear to auscultation bilaterally, no wheezing, no crackles. Normal respiratory effort. No accessory muscle use.  Cardiovascular: Regular rate and rhythm, no murmurs / rubs / gallops. No extremity edema. 2+ pedal pulses. No carotid bruits.  Abdomen: no tenderness, no masses palpated. No hepatosplenomegaly. Bowel sounds positive.  Musculoskeletal: no clubbing / cyanosis.  Swelling to right hand extending past the wrist involving dorsal and volar surface, mild differential warmth, mild erythema, marked tenderness present, patient unable to flex fingers or move her hand appreciably due to swelling and pain.  Skin: no rashes, lesions, ulcers. No induration Neurologic: CN 2-12 grossly intact.. Strength 5/5 in all 4.  Psychiatric: Normal judgment and insight. Alert and oriented x 3. Normal mood.       Labs on Admission: I have personally reviewed following labs and imaging studies  CBC: Recent Labs  Lab 11/02/18 1304  WBC 8.1  NEUTROABS 5.7  HGB 10.7*  HCT  35.6*  MCV 87.3  PLT 864   Basic Metabolic Panel: Recent Labs  Lab 11/02/18 1304  NA 137  K 3.5  CL 101  CO2 27  GLUCOSE 156*  BUN  12  CREATININE 0.63  CALCIUM 8.5*   Liver Function Tests: Recent Labs  Lab 11/02/18 1304  AST 15  ALT 15  ALKPHOS 84  BILITOT 0.3  PROT 7.9  ALBUMIN 3.3*   Radiological Exams on Admission: No results found.  EKG: None.  Assessment/Plan Active Problems:   Swelling of left hand  Left hand swelling-probable cellulitis.  WBC 8.1.  Afebrile.  Elevated ESR 69, CRP 23.  No gout history.  Arthrocentesis attempted in the ED- no significant amount of fluid withdrawn.  Fluid was negative for crystals. -Right Left hand with contrast pending -Follow-up hand surgery recommendations in a.m- for now IV antibiotics- Vanc, hand elevation, follow-up MRI, will see how she trends over the next 24 hours. -Morphine PRN - CBC a.m - F/u joint aspirate culture results. - Vancomycin per pharmacy.  Hypertension-stable. -Resume home losartan, metoprolol, verapamil  Diabetes mellitus-random glucose 156. -Resume home glipizide, hold metformin. - SSI  COPD-stable. -Duo nebs PRN  History of hidradenitis suppurativa.  Breast cancer history-follows with Dr. Delton Coombes.  Status post left lumpectomy and adjuvant chemotherapy and radiotherapy (2016- 2017). -Resume home anastrozole  DVT prophylaxis: SCDS- pending Hand surg evaluation, MRI. Code Status: Full Family Communication: Daughter at bedside Disposition Plan: Per rounding team Consults called: Hand surgery Admission status: Obs, Med-surg.   Bethena Roys MD [facility]  11/02/2018, 9:34 PM

## 2018-11-02 NOTE — Consult Note (Signed)
I reviewed the patient's chart in detail.  Patient has a 5-day history of swelling in her hand.  The patient was seen at 12 noon today at [facility].  A ultrasound at bedside was performed as well as attempted aspirations as the chart will reflect.  This did not have significant yield.  She has pictures in her chart of a cellulitic hand.  She does not demonstrate any inoculation or trauma to the area.  Her past medical history is reviewed in her chart.  It is currently 7:39 PM.  She is still not arrived at [facility].  Given the findings and pictures in her chart I would recommend elevation observation IV antibiotics and see how she trends over the next 24 hours with an MRI scan to rule out any loculated abscess.  I reviewed this at length and have been in close communication with the emergency room.  Varick Keys MD
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
## 2018-11-02 NOTE — ED Triage Notes (Signed)
Pt is having left hand pain. Swelling noted to left hand.. Pain started last Wednesday.

## 2018-11-02 NOTE — ED Provider Notes (Signed)
[facility] EMERGENCY DEPARTMENT Provider Note   CSN: [id number] Arrival date & time: 11/02/18  1223     History   Chief Complaint Chief Complaint  Patient presents with  . Hand Pain    HPI Kristy Harris is a 73 y.o. female presenting for evaluation of left hand swelling.  Patient states for the past 5 days, she has had swelling of her left hand.  Began all of a sudden.  It has stayed constant/maybe gotten slightly worse.  She reports pain and warmth of her hand.  She denies fall, trauma, or injury.  She denies known bite or injury.  She denies fevers, chills, nausea, vomiting.  She has a history of diabetes, blood sugars have been well controlled.  History of breast cancer with left axillary node biopsy 4 years ago.  She is Arimidex.  She is not on blood thinners.  She denies history of hand swelling. She denies swelling elsewhere. No h/o gout.      HPI  Past Medical History:  Diagnosis Date  . Anemia    takes iron supplement  . Arthritis    knees  . COPD (chronic obstructive pulmonary disease) ([place])   . Enlarged heart   . GERD (gastroesophageal reflux disease)   . Hidradenitis suppurativa    buttocks  . History of breast cancer 12/2014  . Hyperlipidemia   . Hypertension    states under control with meds., has been on med. > 40 yrs.  . Morbid obesity ([place])   . Non-insulin dependent type 2 diabetes mellitus ([place])   . Shortness of breath dyspnea    with exertion  . Urinary frequency     Patient Active Problem List   Diagnosis Date Noted  . Swelling of left hand 11/02/2018  . Osteopenia 08/14/2017  . Genetic testing 01/23/2015  . Family history of breast cancer 01/09/2015  . Encounter for pre-operative cardiovascular clearance 11/23/2014  . Aortic stenosis 11/23/2014  . Normal coronary arteries 11/23/2014  . Morbid obesity ([place]) 11/23/2014  . DJD (degenerative joint disease) 11/23/2014  . Breast cancer of upper-outer quadrant of left female breast ([place])  11/13/2014  . Diverticulosis of colon with hemorrhage   . H. pylori infection 05/28/2014  . Abscess of skin and subcutaneous tissue 12/08/2013  . Iron deficiency anemia 09/19/2013  . Abscess of left axilla 08/11/2013  . Abscess of left thigh 05/26/2013  . Abscess of buttock, right 06/13/2011  . Arteritis ([place]) 02/27/2011  . Abnormal cardiovascular function study 02/15/2011  . Peripheral vascular disease ([place]) 02/15/2011  . Diabetes mellitus ([place]) 02/15/2011  . Dyspnea 01/24/2011  . Hypertension 01/24/2011  . Hyperlipidemia 01/24/2011  . Obesity 01/24/2011  . Bruit 01/24/2011  . Murmur 01/24/2011    Past Surgical History:  Procedure Laterality Date  . ABDOMINAL HYSTERECTOMY  25 yrs ago   partial  . BLADDER SUSPENSION     x 2  . BREAST LUMPECTOMY WITH RADIOACTIVE SEED AND SENTINEL LYMPH NODE BIOPSY Left 12/25/2014   Procedure: RADIOACTIVE SEED GIUDED LEFT BREAST LUMPECTOMY, LEFT AXILLARY SENTINEL LYMPH NODE BIOPSY;  Surgeon: Alphonsa Overall, MD;  Location: [place];  Service: General;  Laterality: Left;  . CARDIAC CATHETERIZATION  02/21/2011   Normal coronary arteries, normal EF  . COLONOSCOPY WITH PROPOFOL  11/18/2013  . ESOPHAGOGASTRODUODENOSCOPY (EGD) WITH PROPOFOL  11/18/2013  . EXCISION HYDRADENITIS LABIA N/A 12/08/2013   Procedure: EXCISION HIDRADENITIS PUBIC AREA;  Surgeon: Alphonsa Overall, MD;  Location: [facility];  Service: General;  Laterality: N/A;  .  HYDRADENITIS EXCISION Left 12/08/2013   Procedure: EXCISION HIDRADENITIS AXILLA;  Surgeon: Alphonsa Overall, MD;  Location: [facility];  Service: General;  Laterality: Left;  . INCISION AND DRAINAGE ABSCESS  08/17/2008   perineum and buttock  . IRRIGATION AND DEBRIDEMENT ABSCESS Right 12/23/2012   Procedure: incision  AND DEBRIDEMENT right buttock infection ;  Surgeon: Shann Medal, MD;  Location: [facility];  Service: General;  Laterality: Right;  . PILONIDAL CYST / SINUS EXCISION  06/04/2004  . PILONIDAL CYST EXCISION    . PORT-A-CATH  REMOVAL Right 03/21/2016   Procedure: MINOR REMOVAL PORT-A-CATH;  Surgeon: Alphonsa Overall, MD;  Location: [place];  Service: General;  Laterality: Right;  MINOR REMOVAL PORT-A-CATH  . PORTACATH PLACEMENT N/A 01/12/2015   Procedure: INSERTION PORT-A-CATH;  Surgeon: Alphonsa Overall, MD;  Location: [facility];  Service: General;  Laterality: N/A;  . TONSILLECTOMY       OB History   No obstetric history on file.      Home Medications    Prior to Admission medications   Medication Sig Start Date End Date Taking? Authorizing Provider  acetaminophen (TYLENOL) 500 MG tablet Take 1,000 mg by mouth every 6 (six) hours as needed for mild pain or headache.   Yes [provider]  albuterol (PROVENTIL HFA;VENTOLIN HFA) 108 (90 BASE) MCG/ACT inhaler Inhale 2 puffs into the lungs every 4 (four) hours as needed for shortness of breath.   Yes [provider]  anastrozole (ARIMIDEX) 1 MG tablet TAKE 1 TABLET BY MOUTH EVERY DAY Patient taking differently: Take 1 mg by mouth daily.  05/26/18  Yes Derek Jack, MD  atorvastatin (LIPITOR) 20 MG tablet Take 20 mg by mouth every morning.    Yes [provider]  Blood Glucose Monitoring Suppl (BLOOD GLUCOSE MONITOR SYSTEM) w/Device KIT With lancets and strips #100 6rf Dx: E11.9 Test bid 05/06/17  Yes [provider]  Calcium 150 MG TABS Take 1 tablet by mouth daily.  08/11/18  Yes [provider]  cholecalciferol (VITAMIN D) 1000 units tablet Take 2,000 Units by mouth daily.   Yes [provider]  doxycycline (DORYX) 100 MG EC tablet Take 100 mg by mouth as needed.    Yes [provider]  ferrous sulfate 325 (65 FE) MG tablet Take 325 mg by mouth 3 (three) times daily with meals.   Yes [provider]  fluconazole (DIFLUCAN) 150 MG tablet Take 150 mg by mouth as needed.  08/07/17  Yes [provider]  furosemide (LASIX) 20 MG tablet Take 20 mg by mouth 3 (three) times a  week.   Yes [provider]  glipiZIDE (GLUCOTROL XL) 10 MG 24 hr tablet Take 10 mg by mouth daily with breakfast.  02/18/17  Yes [provider]  ibuprofen (ADVIL) 200 MG tablet Take 200 mg by mouth every 6 (six) hours as needed.   Yes [provider]  losartan (COZAAR) 100 MG tablet Take 100 mg by mouth daily.   Yes [provider]  metFORMIN (GLUCOPHAGE-XR) 500 MG 24 hr tablet TAKE ONE TABLET BY MOUTH TWICE DAILY 03/24/16  Yes [provider]  metoprolol succinate (TOPROL-XL) 100 MG 24 hr tablet TAKE ONE (1) TABLET EACH DAY 10/25/15  Yes [provider]  [place]. Devices MISC Underpad's 6 Bags/15-90 per month  Incontinence Pads 3 Bags/20-60 per month  2 Box of Gloves per month  40 box of gauze  20 rolls of tape  Nutrition supplement/30 day supply 07/03/15  Yes [provider]  pantoprazole (PROTONIX) 40 MG tablet TAKE 1 TABLET (40 MG TOTAL) BY MOUTH DAILY. Patient taking differently: Take 40 mg by mouth as needed.  02/06/15  Yes Nandigam, Venia Minks, MD  polyethylene glycol (MIRALAX / GLYCOLAX) packet Take 17 g by mouth daily as needed for mild constipation. Patient taking differently: Take 17 g by mouth as needed for mild constipation.  05/30/14  Yes Robbie Lis, MD  sodium chloride irrigation 0.9 % irrigation USE AS DIRECTED 08/06/17  Yes [provider]  verapamil (CALAN-SR) 120 MG CR tablet Take 120 mg by mouth daily. 12/30/14  Yes [provider]    Family History Family History  Problem Relation Age of Onset  . Heart attack Mother        had multiple health problems  . Lung cancer Father 69       smoker  . Prostate cancer Brother   . Lung cancer Brother        dx. 76s; smoker  . Throat cancer Brother        dx. 50s; smoker  . Diabetes Sister        has 3 living sisters with multiple health problems  . Hypertension Son   . Hypertension Daughter   . Breast cancer Sister 7  . Breast cancer Sister         dx. 74s  . Breast cancer Maternal Grandmother        dx. 55s  . Breast cancer Maternal Aunt        dx. older than 17  . Dementia Maternal Aunt   . Breast cancer Cousin        dx. 68s  . Hypertension Brother     Social History Social History   Tobacco Use  . Smoking status: Former Smoker    Years: 0.00    Quit date: 03/03/1994    Years since quitting: 24.6  . Smokeless tobacco: Never Used  Substance Use Topics  . Alcohol use: No  . Drug use: No     Allergies   Lisinopril and Penicillins   Review of Systems Review of Systems  Musculoskeletal: Positive for arthralgias and joint swelling.  Allergic/Immunologic: Positive for immunocompromised state.  Hematological: Does not bruise/bleed easily.  All other systems reviewed and are negative.    Physical Exam Updated Vital Signs BP (!) 151/62 (BP Location: Right Arm)   Pulse 79   Temp 99.1 F (37.3 C) (Oral)   Resp 18   Ht _0  (1.626 m)   Wt 127 kg   SpO2 95%   BMI 48.06 kg/m   Physical Exam Vitals signs and nursing note reviewed.  Constitutional:      General: She is not in acute distress.    Appearance: She is well-developed.     Comments: Obese female resting comfortable in the bed in NAD  HENT:     Head: Normocephalic and atraumatic.  Eyes:     Conjunctiva/sclera: Conjunctivae normal.     Pupils: Pupils are equal, round, and reactive to light.  Neck:     Musculoskeletal: Normal range of motion and neck supple.  Cardiovascular:     Rate and Rhythm: Normal rate and regular rhythm.     Pulses: Normal pulses.  Pulmonary:     Effort: Pulmonary effort is normal. No respiratory distress.     Breath sounds: Normal breath sounds. No wheezing.  Abdominal:     General: There is no distension.  Palpations: Abdomen is soft. There is no mass.     Tenderness: There is no abdominal tenderness. There is no guarding or rebound.  Musculoskeletal:        General: Swelling and tenderness present.      Comments: Obvious swelling of the left hand.  Warmth of the hand and wrist.  Patient unable to range wrist due to pain.  Unable to flex or extend at the MCP due to pain.  Passive range of motion tolerable at the PIP of all joints. Mild erythema, no induration. No streaking. Warmth, swelling and tenderness extends into the distal forearm.   Skin:    General: Skin is warm and dry.     Capillary Refill: Capillary refill takes less than 2 seconds.  Neurological:     Mental Status: She is alert and oriented to person, place, and time.        ED Treatments / Results  Labs (all labs ordered are listed, but only abnormal results are displayed) Labs Reviewed  CBC WITH DIFFERENTIAL/PLATELET - Abnormal; Notable for the following components:      Result Value   Hemoglobin 10.7 (*)    HCT 35.6 (*)    All other components within normal limits  COMPREHENSIVE METABOLIC PANEL - Abnormal; Notable for the following components:   Glucose, Bld 156 (*)    Calcium 8.5 (*)    Albumin 3.3 (*)    All other components within normal limits  C-REACTIVE PROTEIN - Abnormal; Notable for the following components:   CRP 23.1 (*)    All other components within normal limits  SEDIMENTATION RATE - Abnormal; Notable for the following components:   Sed Rate 69 (*)    All other components within normal limits  SARS CORONAVIRUS 2 (HOSPITAL ORDER, [place] [facility])  BODY FLUID CULTURE  SYNOVIAL FLUID, CRYSTAL    EKG None  Radiology No results found.     Procedures .Joint Aspiration/Arthrocentesis  Date/Time: 11/02/2018 3:45 PM Performed by: Franchot Heidelberg, PA-C Authorized by: Franchot Heidelberg, PA-C   Consent:    Consent obtained:  Verbal   Consent given by:  Patient   Risks discussed:  Bleeding, incomplete drainage, infection, nerve damage, pain and poor cosmetic result Location:    Location:  Wrist   Wrist:  L intercarpal Anesthesia (see MAR for exact dosages):    Anesthesia  method:  Local infiltration   Local anesthetic:  Lidocaine 2% WITH epi Procedure details:    Preparation: Patient was prepped and draped in usual sterile fashion     Needle gauge:  36 G   Ultrasound guidance: yes     Aspirate characteristics:  Blood-tinged   Specimen collected: yes   Post-procedure details:    Dressing:  Adhesive bandage   Patient tolerance of procedure:  Tolerated well, no immediate complications Ultrasound ED Soft Tissue  Date/Time: 11/02/2018 3:46 PM Performed by: Franchot Heidelberg, PA-C Authorized by: Franchot Heidelberg, PA-C   Procedure details:    Indications: limb pain and evaluate for cellulitis     Transverse view:  Visualized   Longitudinal view:  Visualized   Images: not archived   Location:    Location: upper extremity     Side:  Left Comments:     Edema. Mostly surrounding tendon sheaths. No obvious abscess   (including critical care time)  Medications Ordered in ED Medications  morphine 4 MG/ML injection 4 mg (4 mg Intravenous Given 11/02/18 1318)  lidocaine-EPINEPHrine (XYLOCAINE-EPINEPHrine) 1 %-1:200000 (PF) injection 20  mL (20 mLs Other Given 11/02/18 1330)  clindamycin (CLEOCIN) IVPB 600 mg ( Intravenous Stopped 11/02/18 1641)     Initial Impression / Assessment and Plan / ED Course  I have reviewed the triage vital signs and the nursing notes.  Pertinent labs & imaging results that were available during my care of the patient were reviewed by me and considered in my medical decision making (see chart for details).        Presenting for evaluation of left hand swelling.  Physical exam concerning, hand is very swollen, warm, patient is unable to range.  Concern for septic joint of the wrist.  Also consider extensor tenosynovitis versus gout versus cellulitis.  Case discussed with attending, Dr. Sabra Heck evaluated the patient.  Will obtain labs including inflammatory markers.  Bedside ultrasound shows significant edema, often around the tendon  sheath.  Attempted aspiration with minimal return.  No leukocytosis, patient afebrile, doubt systemic infection.  Will call hand for further management.  Discussed with Dr. Amedeo Plenty from hand surgery who recommends admission to the hospitalist service for presumed cellulitis with transfer to [facility].  He will evaluate the patient at [facility].  Recommends MRI for further evaluation.  MRI should not delay transfer, can be done at [facility] upon arrival.  Discussed plan with patient who is agreeable.  Will call for admission. clinda started for presumed cellulitis.  Discussed with Dr. Denton Brick from [facility], pt to be admitted.   Final Clinical Impressions(s) / ED Diagnoses   Final diagnoses:  Cellulitis of left upper extremity  Swelling of left hand    ED Discharge Orders    None       Franchot Heidelberg, PA-C 11/02/18 1736    Noemi Chapel, MD 11/03/18 [phone]

## 2018-11-02 NOTE — ED Notes (Signed)
EDP at bedside updating patient and family. 

## 2018-11-03 DIAGNOSIS — Z803 Family history of malignant neoplasm of breast: Secondary | ICD-10-CM | POA: Diagnosis not present

## 2018-11-03 DIAGNOSIS — Z853 Personal history of malignant neoplasm of breast: Secondary | ICD-10-CM | POA: Diagnosis not present

## 2018-11-03 DIAGNOSIS — L89322 Pressure ulcer of left buttock, stage 2: Secondary | ICD-10-CM | POA: Diagnosis present

## 2018-11-03 DIAGNOSIS — Z87891 Personal history of nicotine dependence: Secondary | ICD-10-CM | POA: Diagnosis not present

## 2018-11-03 DIAGNOSIS — J449 Chronic obstructive pulmonary disease, unspecified: Secondary | ICD-10-CM | POA: Diagnosis present

## 2018-11-03 DIAGNOSIS — E1151 Type 2 diabetes mellitus with diabetic peripheral angiopathy without gangrene: Secondary | ICD-10-CM | POA: Diagnosis present

## 2018-11-03 DIAGNOSIS — M60032 Infective myositis, left forearm: Secondary | ICD-10-CM | POA: Diagnosis present

## 2018-11-03 DIAGNOSIS — E119 Type 2 diabetes mellitus without complications: Secondary | ICD-10-CM | POA: Diagnosis not present

## 2018-11-03 DIAGNOSIS — E785 Hyperlipidemia, unspecified: Secondary | ICD-10-CM | POA: Diagnosis present

## 2018-11-03 DIAGNOSIS — Z8249 Family history of ischemic heart disease and other diseases of the circulatory system: Secondary | ICD-10-CM | POA: Diagnosis not present

## 2018-11-03 DIAGNOSIS — R7982 Elevated C-reactive protein (CRP): Secondary | ICD-10-CM | POA: Diagnosis present

## 2018-11-03 DIAGNOSIS — L8990 Pressure ulcer of unspecified site, unspecified stage: Secondary | ICD-10-CM | POA: Insufficient documentation

## 2018-11-03 DIAGNOSIS — I350 Nonrheumatic aortic (valve) stenosis: Secondary | ICD-10-CM | POA: Diagnosis present

## 2018-11-03 DIAGNOSIS — L03114 Cellulitis of left upper limb: Secondary | ICD-10-CM | POA: Diagnosis present

## 2018-11-03 DIAGNOSIS — Z79899 Other long term (current) drug therapy: Secondary | ICD-10-CM | POA: Diagnosis not present

## 2018-11-03 DIAGNOSIS — M7989 Other specified soft tissue disorders: Secondary | ICD-10-CM | POA: Diagnosis not present

## 2018-11-03 DIAGNOSIS — Z7984 Long term (current) use of oral hypoglycemic drugs: Secondary | ICD-10-CM | POA: Diagnosis not present

## 2018-11-03 DIAGNOSIS — Z6841 Body Mass Index (BMI) 40.0 and over, adult: Secondary | ICD-10-CM | POA: Diagnosis not present

## 2018-11-03 DIAGNOSIS — Z801 Family history of malignant neoplasm of trachea, bronchus and lung: Secondary | ICD-10-CM | POA: Diagnosis not present

## 2018-11-03 DIAGNOSIS — Z90710 Acquired absence of both cervix and uterus: Secondary | ICD-10-CM | POA: Diagnosis not present

## 2018-11-03 DIAGNOSIS — Z880 Allergy status to penicillin: Secondary | ICD-10-CM | POA: Diagnosis not present

## 2018-11-03 DIAGNOSIS — Z833 Family history of diabetes mellitus: Secondary | ICD-10-CM | POA: Diagnosis not present

## 2018-11-03 DIAGNOSIS — M60042 Infective myositis, left hand: Secondary | ICD-10-CM | POA: Diagnosis not present

## 2018-11-03 DIAGNOSIS — I10 Essential (primary) hypertension: Secondary | ICD-10-CM | POA: Diagnosis present

## 2018-11-03 DIAGNOSIS — Z20828 Contact with and (suspected) exposure to other viral communicable diseases: Secondary | ICD-10-CM | POA: Diagnosis present

## 2018-11-03 DIAGNOSIS — K219 Gastro-esophageal reflux disease without esophagitis: Secondary | ICD-10-CM | POA: Diagnosis present

## 2018-11-03 DIAGNOSIS — L89312 Pressure ulcer of right buttock, stage 2: Secondary | ICD-10-CM | POA: Diagnosis present

## 2018-11-03 LAB — CBC
HCT: 34.4 % — ABNORMAL LOW (ref 36.0–46.0)
Hemoglobin: 10.4 g/dL — ABNORMAL LOW (ref 12.0–15.0)
MCH: 26.4 pg (ref 26.0–34.0)
MCHC: 30.2 g/dL (ref 30.0–36.0)
MCV: 87.3 fL (ref 80.0–100.0)
Platelets: 309 10*3/uL (ref 150–400)
RBC: 3.94 MIL/uL (ref 3.87–5.11)
RDW: 14.9 % (ref 11.5–15.5)
WBC: 8.5 10*3/uL (ref 4.0–10.5)
nRBC: 0.2 % (ref 0.0–0.2)

## 2018-11-03 LAB — HEMOGLOBIN A1C
Hgb A1c MFr Bld: 7.1 % — ABNORMAL HIGH (ref 4.8–5.6)
Mean Plasma Glucose: 157.07 mg/dL

## 2018-11-03 LAB — HIV ANTIBODY (ROUTINE TESTING W REFLEX): HIV Screen 4th Generation wRfx: NONREACTIVE

## 2018-11-03 MED ORDER — ENOXAPARIN SODIUM 40 MG/0.4ML ~~LOC~~ SOLN
40.0000 mg | SUBCUTANEOUS | Status: DC
  Administered 2018-11-03 – 2018-11-05 (×3): 40 mg via SUBCUTANEOUS
  Filled 2018-11-03 (×3): qty 0.4

## 2018-11-03 NOTE — Plan of Care (Signed)
  Problem: Pain Managment: Goal: General experience of comfort will improve Outcome: Progressing   Problem: Safety: Goal: Ability to remain free from injury will improve Outcome: Progressing   

## 2018-11-03 NOTE — Consult Note (Signed)
MRI does not show any obvious surgical lesion to drain.  The myositis and other cellulitic issues are not surprising.  Once again we will recommend observation elevation and a conservative route to see how she evolves over the next few days.  Redina Zeller MD

## 2018-11-03 NOTE — Consult Note (Signed)
Patient presents for follow-up evaluation.  Patient was seen last night.  She certainly does not display any worsening.  There is no ascending cellulitis or lymphangitis.  I have taken her out of her mission sling examined the arm.  The patient has improving wrinkling and skin parameters.  There is no tense swelling erythema or worsening signs.  I detect improvement in her finger motion.  We will await the MRI report and I will review this.  I discussed with the patient I feel that today we need to concentrate our efforts on elevation in a strict sense with a mission sling as well as IV antibiotics.  Overall no complications and patient does seem to be responding initially to the treatment for cellulitis.  We will continue to watch her closely and see how things evolve.  Chest is clear.  Right upper extremity is stable.  Lower extremity examination is benign and she is able to stand and walk.  Evaan Tidwell MD cell phone [phone]

## 2018-11-03 NOTE — Progress Notes (Addendum)
PROGRESS NOTE    Kristy Harris  JDB:[id number] DOB: 11-30-1945 DOA: 11/02/2018 PCP: Bridget Hartshorn, NP  Brief Narrative:Kristy Harris is a 73 y.o. female with medical history significant for diabetes mellitus, hypertension, hydradenitis 2.  Fever, COPD, who presented to ED with complaints of swelling of her left hand of 6 days duration.  Swelling is accompanied by severe pain.   She denies trauma.  ED Course: Stable vitals.  WBC 8.1.  CRP elevated 23, ESR 69. EDP talked to hand surgeon on call at [facility], Dr. Amedeo Plenty, recommended transfer to [facility], IV antibiotics and MRI of the left hand.  Attempted arthrocentesis in the ED-no significant fluid withdrawn.  IV Clindamycin started in ED   Assessment & Plan:   Left hand myositis/severe cellulitis -MRI notes diffuse myositis and cellulitis, no fluid collection at this time -Continue IV vancomycin -Hand surgery Dr. Amedeo Plenty following -If this organizes into an abscess will need I&D -Continue conservative management for now -Strict elevation of left forearm, sling  Hypertension-stable. -Continue losartan, metoprolol, verapamil  Diabetes mellitus- -CBG stable, A1c is 7.1 -Resume home glipizide, hold metformin. - SSI  COPD-stable. -Duo nebs PRN  History of hidradenitis suppurativa.  Breast cancer history-follows with Dr. Delton Coombes.  Status post left lumpectomy and adjuvant chemotherapy and radiotherapy (2016- 2017). -Continue anastrozole  DVT prophylaxis:  Add Lovenox  code Status: Full Family Communication:  No family at bedside, updated patient Disposition Plan:  Home possibly in 48 hours pending clinical improvement   Consultants:   Hand surgery   Procedures:   Antimicrobials:    Subjective: -Feels like hand is starting to improve  Objective: Vitals:   11/02/18 2052 11/03/18 0430 11/03/18 0430 11/03/18 0839  BP: (!) 148/68 (!) 158/59 (!) 158/59 (!) 150/68  Pulse: 90 96 96 93  Resp:    18  Temp: 98.4  F (36.9 C) 98.1 F (36.7 C) 98.1 F (36.7 C) 98.5 F (36.9 C)  TempSrc: Oral Oral Oral Oral  SpO2: 93% 95% 95% 95%  Weight:      Height:        Intake/Output Summary (Last 24 hours) at 11/03/2018 1438 Last data filed at 11/03/2018 1246 Gross per 24 hour  Intake 773.7 ml  Output 950 ml  Net -176.3 ml   Filed Weights   11/02/18 1230  Weight: 127 kg    Examination:  General exam: Appears calm and comfortable no distress, obese Respiratory system: Clear to auscultation. Respiratory effort normal. Cardiovascular system: S1 & S2 heard, RRR.  Gastrointestinal system: Abdomen is nondistended, soft and nontender.Normal bowel sounds heard. Central nervous system: Alert and oriented. No focal neurological deficits. Extremities: Left hand elevated, improving swelling redness erythema of left hand Skin: As above Psychiatry: Judgement and insight appear normal. Mood & affect appropriate.     Data Reviewed:   CBC: Recent Labs  Lab 11/02/18 1304 11/03/18 0041  WBC 8.1 8.5  NEUTROABS 5.7  --   HGB 10.7* 10.4*  HCT 35.6* 34.4*  MCV 87.3 87.3  PLT 290 612   Basic Metabolic Panel: Recent Labs  Lab 11/02/18 1304  NA 137  K 3.5  CL 101  CO2 27  GLUCOSE 156*  BUN 12  CREATININE 0.63  CALCIUM 8.5*   GFR: Estimated Creatinine Clearance: 86.4 mL/min (by C-G formula based on SCr of 0.63 mg/dL). Liver Function Tests: Recent Labs  Lab 11/02/18 1304  AST 15  ALT 15  ALKPHOS 84  BILITOT 0.3  PROT 7.9  ALBUMIN 3.3*  No results for input(s): LIPASE, AMYLASE in the last 168 hours. No results for input(s): AMMONIA in the last 168 hours. Coagulation Profile: No results for input(s): INR, PROTIME in the last 168 hours. Cardiac Enzymes: No results for input(s): CKTOTAL, CKMB, CKMBINDEX, TROPONINI in the last 168 hours. BNP (last 3 results) No results for input(s): PROBNP in the last 8760 hours. HbA1C: Recent Labs    11/03/18 0041  HGBA1C 7.1*   CBG: No results for  input(s): GLUCAP in the last 168 hours. Lipid Profile: No results for input(s): CHOL, HDL, LDLCALC, TRIG, CHOLHDL, LDLDIRECT in the last 72 hours. Thyroid Function Tests: No results for input(s): TSH, T4TOTAL, FREET4, T3FREE, THYROIDAB in the last 72 hours. Anemia Panel: No results for input(s): VITAMINB12, FOLATE, FERRITIN, TIBC, IRON, RETICCTPCT in the last 72 hours. Urine analysis:    Component Value Date/Time   COLORURINE YELLOW 05/28/2014 1140   APPEARANCEUR CLOUDY (A) 05/28/2014 1140   LABSPEC 1.021 05/28/2014 1140   PHURINE 6.0 05/28/2014 1140   GLUCOSEU NEGATIVE 05/28/2014 1140   HGBUR TRACE (A) 05/28/2014 1140   BILIRUBINUR NEGATIVE 05/28/2014 1140   KETONESUR NEGATIVE 05/28/2014 1140   PROTEINUR NEGATIVE 05/28/2014 1140   UROBILINOGEN 0.2 05/28/2014 1140   NITRITE NEGATIVE 05/28/2014 1140   LEUKOCYTESUR MODERATE (A) 05/28/2014 1140   Sepsis Labs: _0 (procalcitonin:4,lacticidven:4)  ) Recent Results (from the past 240 hour(s))  Body fluid culture     Status: None (Preliminary result)   Collection Time: 11/02/18  1:04 PM   Specimen: BLOOD LEFT WRIST; Body Fluid  Result Value Ref Range Status   Specimen Description   Final    BLOOD LEFT WRIST Performed at [facility], [address], [place], [place] [address]    Special Requests   Final    Normal Performed at [facility], [address], [place], [place] [address]    Gram Stain PENDING  Incomplete   Culture   Final    NO GROWTH < 24 HOURS Performed at [facility], [place] [address] [place], [place] [address]    Report Status PENDING  Incomplete  SARS Coronavirus 2 [facility] order, Performed in [facility] hospital lab) Nasopharyngeal Nasopharyngeal Swab     Status: None   Collection Time: 11/02/18  3:48 PM   Specimen: Nasopharyngeal Swab  Result Value Ref Range Status   SARS Coronavirus 2 NEGATIVE NEGATIVE Final    Comment: (NOTE) If result is NEGATIVE SARS-CoV-2 target nucleic acids are NOT  DETECTED. The SARS-CoV-2 RNA is generally detectable in upper and lower  respiratory specimens during the acute phase of infection. The lowest  concentration of SARS-CoV-2 viral copies this assay can detect is 250  copies / mL. A negative result does not preclude SARS-CoV-2 infection  and should not be used as the sole basis for treatment or other  patient management decisions.  A negative result may occur with  improper specimen collection / handling, submission of specimen other  than nasopharyngeal swab, presence of viral mutation(s) within the  areas targeted by this assay, and inadequate number of viral copies  (<250 copies / mL). A negative result must be combined with clinical  observations, patient history, and epidemiological information. If result is POSITIVE SARS-CoV-2 target nucleic acids are DETECTED. The SARS-CoV-2 RNA is generally detectable in upper and lower  respiratory specimens dur ing the acute phase of infection.  Positive  results are indicative of active infection with SARS-CoV-2.  Clinical  correlation with patient history and other diagnostic information is  necessary to determine patient infection status.  Positive results do  not rule out bacterial infection or co-infection with other viruses. If result is PRESUMPTIVE POSTIVE SARS-CoV-2 nucleic acids MAY BE PRESENT.   A presumptive positive result was obtained on the submitted specimen  and confirmed on repeat testing.  While 2019 novel coronavirus  (SARS-CoV-2) nucleic acids may be present in the submitted sample  additional confirmatory testing may be necessary for epidemiological  and / or clinical management purposes  to differentiate between  SARS-CoV-2 and other Sarbecovirus currently known to infect humans.  If clinically indicated additional testing with an alternate test  methodology [phone]) is advised. The SARS-CoV-2 RNA is generally  detectable in upper and lower respiratory sp ecimens during  the acute  phase of infection. The expected result is Negative. Fact Sheet for Patients:  StrictlyIdeas.no Fact Sheet for Healthcare Providers: BankingDealers.co.za This test is not yet approved or cleared by the [place] FDA and has been authorized for detection and/or diagnosis of SARS-CoV-2 by FDA under an Emergency Use Authorization (EUA).  This EUA will remain in effect (meaning this test can be used) for the duration of the COVID-19 declaration under Section 564(b)(1) of the Act, 21 U.S.C. section 360bbb-3(b)(1), unless the authorization is terminated or revoked sooner. Performed at [facility], [address] [place], [place] [address]          Radiology Studies: Mr Hand Left Wo Contrast  Result Date: 11/03/2018 CLINICAL DATA:  Soft tissue infection suspected, hand swelling EXAM: MRI OF THE LEFT HAND WITHOUT CONTRAST TECHNIQUE: Multiplanar, multisequence MR imaging of the was performed. No intravenous contrast was administered. COMPARISON:  None. FINDINGS: Bones/Joint/Cartilage There is increased T2 signal seen throughout the carpals. This is most notable in the capitate and the hamate with subtly decreased T1 hypointensity. However there is no areas of cortical destruction or periosteal reaction. No bony erosions. The remainder of the marrow signal appears to be normal in appearance. There is mild joint space loss seen at the first [facility] joint with subchondral cystic change. No large joint effusion is seen. Ligaments The collateral ligaments are intact. The visualized portion of the sagittal bands, pulleys, and volar plates are intact. Muscles and Tendons Normal signal seen throughout the muscles. No muscular edema, tear, or atrophy. The flexor and extensor tendons are normal in signal intensity and morphology. Soft tissues Diffuse subcutaneous edema is seen throughout the hand with overlying dorsal skin thickening. There is increased  feathery signal seen within muscles surrounding the hand. No focal muscular tear is seen. IMPRESSION: 1. Findings of diffuse cellulitis and myositis. 2. Probable reactive marrow seen throughout the carpals without definite evidence of osteomyelitis. Electronically Signed   By: Prudencio Pair M.D.   On: 11/03/2018 08:02        Scheduled Meds:  anastrozole  1 mg Oral Daily   atorvastatin  20 mg Oral q morning - 10a   glipiZIDE  10 mg Oral Q breakfast   losartan  100 mg Oral Daily   metoprolol succinate  100 mg Oral Daily   verapamil  120 mg Oral Daily   Continuous Infusions:  vancomycin 1,250 mg (11/03/18 0923)     LOS: 0 days    Time spent: 13mn    PDomenic Polite MD [facility] Page via [email or web address], password [email or web address] After 7PM please contact night-coverage  11/03/2018, 2:38 PM

## 2018-11-04 DIAGNOSIS — E119 Type 2 diabetes mellitus without complications: Secondary | ICD-10-CM

## 2018-11-04 DIAGNOSIS — M60042 Infective myositis, left hand: Secondary | ICD-10-CM

## 2018-11-04 LAB — BASIC METABOLIC PANEL
Anion gap: 10 (ref 5–15)
BUN: 7 mg/dL — ABNORMAL LOW (ref 8–23)
CO2: 27 mmol/L (ref 22–32)
Calcium: 7.9 mg/dL — ABNORMAL LOW (ref 8.9–10.3)
Chloride: 98 mmol/L (ref 98–111)
Creatinine, Ser: 0.62 mg/dL (ref 0.44–1.00)
GFR calc Af Amer: >60 mL/min (ref >60)
GFR calc non Af Amer: >60 mL/min (ref >60)
Glucose, Bld: 110 mg/dL — ABNORMAL HIGH (ref 70–99)
Potassium: 3.6 mmol/L (ref 3.5–5.1)
Sodium: 135 mmol/L (ref 135–145)

## 2018-11-04 LAB — CBC
HCT: 32.5 % — ABNORMAL LOW (ref 36.0–46.0)
Hemoglobin: 9.8 g/dL — ABNORMAL LOW (ref 12.0–15.0)
MCH: 25.9 pg — ABNORMAL LOW (ref 26.0–34.0)
MCHC: 30.2 g/dL (ref 30.0–36.0)
MCV: 86 fL (ref 80.0–100.0)
Platelets: 291 10*3/uL (ref 150–400)
RBC: 3.78 MIL/uL — ABNORMAL LOW (ref 3.87–5.11)
RDW: 14.8 % (ref 11.5–15.5)
WBC: 7.9 10*3/uL (ref 4.0–10.5)
nRBC: 0 % (ref 0.0–0.2)

## 2018-11-04 MED ORDER — IPRATROPIUM-ALBUTEROL 0.5-2.5 (3) MG/3ML IN SOLN: 3.0000 mL | RESPIRATORY_TRACT | Status: DC | PRN

## 2018-11-04 MED ORDER — POLYETHYLENE GLYCOL 3350 17 G PO PACK: 17.0000 g | PACK | Freq: Every day | ORAL | Status: DC | PRN

## 2018-11-04 MED ORDER — SENNOSIDES-DOCUSATE SODIUM 8.6-50 MG PO TABS
2.0000 | ORAL_TABLET | Freq: Every evening | ORAL | Status: DC | PRN
  Administered 2018-11-04: 2 via ORAL
  Filled 2018-11-04: qty 2

## 2018-11-04 NOTE — Evaluation (Signed)
Physical Therapy Evaluation Patient Details Name: Kristy Harris MRN: [id number] DOB: 1945-12-05 Today's Date: 11/04/2018   History of Present Illness  Pt is a 73 y.o. female admitted 11/02/18 with L hand pain and swelling. Worked up for LUE severe cellulitis and myositis; plan for conservative management. PMH includes DM, HTN, COPD, obesity.    Clinical Impression  Pt presents with an overall decrease in functional mobility secondary to above. PTA, pt limited household ambulator with cane/RW, daughters and aide provide assist with ADLs and household tasks, uses w/c for longer distances. Today, pt agreeable to ambulate a few feet with quad cane, requires modA from daughter to stand. Declining further mobility due to LUE pain and fatigue. Pt would benefit from continued acute PT services to maximize functional mobility and independence prior to d/c with HHPT services.     Follow Up Recommendations Home health PT;Supervision for mobility/OOB    Equipment Recommendations  None recommended by PT    Recommendations for Other Services       Precautions / Restrictions Precautions Precautions: Fall Required Braces or Orthoses: (LUE sling delivered, but pt declining to don with standing mobility) Restrictions Weight Bearing Restrictions: No Other Position/Activity Restrictions: encourage LUE elevation for edema control      Mobility  Bed Mobility Overal bed mobility: Needs Assistance Bed Mobility: Supine to Sit     Supine to sit: Min assist;HOB elevated     General bed mobility comments: Received sitting in recliner  Transfers Overall transfer level: Needs assistance Equipment used: 1 person hand held assist Transfers: Sit to/from Stand Sit to Stand: Mod assist         General transfer comment: Pt not satisfied with type of assist PT offering her; daughter present to demonstrate technique they use at home, pt requiring RUE HHA to power into  standing  Ambulation/Gait Ambulation/Gait assistance: Min guard Gait Distance (Feet): 3 Feet Assistive device: Quad cane Gait Pattern/deviations: Step-to pattern;Trunk flexed;Wide base of support Gait velocity: Decreased Gait velocity interpretation: <1.31 ft/sec, indicative of household ambulator General Gait Details: Slow steps from recliner to bed with wide BOS and min guard; pt holding quad cane in RUE but height too tall (per pt request) and not providing much support. Educ pt and daughter on readjusting SPC and quad cane to better align with pt's height. Pt declined further distance due to pain/fatigue  Stairs            Wheelchair Mobility    Modified Rankin (Stroke Patients Only)       Balance Overall balance assessment: Needs assistance Sitting-balance support: Single extremity supported;No upper extremity supported;Feet supported Sitting balance-Leahy Scale: Good     Standing balance support: Single extremity supported Standing balance-Leahy Scale: Fair Standing balance comment: pt struggled with maintaining balance using only straight cane. Pt reluctant to use rw "I think that's what caused (the LUE edema/pain)" Advised pt to discuss this with MD.                             Pertinent Vitals/Pain Pain Assessment: Faces Faces Pain Scale: Hurts little more Pain Location: L forearm Pain Descriptors / Indicators: Grimacing;Aching;Guarding Pain Intervention(s): Monitored during session(pt declined use of sling with mobility)    Home Living Family/patient expects to be discharged to:: Private residence Living Arrangements: Children Available Help at Discharge: Family;Personal care attendant Type of Home: House Home Access: Ramped entrance     Home Layout: One level Home Equipment: Environmental consultant -  2 wheels;Walker - 4 wheels;Cane - single point;Shower seat;Grab bars - toilet;Grab bars - tub/shower;Wheelchair - manual      Prior Function Level of  Independence: Needs assistance   Gait / Transfers Assistance Needed: Ambulatory with RW vs. quad cane vs. SPC; minimal household distances. Pushed in w/c for longer distances  ADL's / Homemaking Assistance Needed: Assist for tub shower transfer and some assist for bathing/dressing. Has aide support 5x/wk for 3 hrs, daughters assist as well. Daughters assist with wound care        Hand Dominance   Dominant Hand: Right    Extremity/Trunk Assessment   Upper Extremity Assessment Upper Extremity Assessment: Defer to OT evaluation;LUE deficits/detail LUE Deficits / Details: painful to touch elbow and distal. Pt able to wiggle digits. Unable to flex digits or wrist, baseline due to arthritis per pt. Pt passively moving LUE. R hand on L hand to raise for pillow placement and line management. LUE: Unable to fully assess due to pain LUE Coordination: decreased fine motor;decreased gross motor    Lower Extremity Assessment Lower Extremity Assessment: Generalized weakness       Communication   Communication: No difficulties  Cognition Arousal/Alertness: Awake/alert Behavior During Therapy: WFL for tasks assessed/performed Overall Cognitive Status: Within Functional Limits for tasks assessed                                        General Comments General comments (skin integrity, edema, etc.): Daughter present and supportive. RN performed posterior wound care while pt standing    Exercises     Assessment/Plan    PT Assessment Patient needs continued PT services  PT Problem List Decreased strength;Decreased range of motion;Decreased activity tolerance;Decreased balance;Decreased mobility;Decreased knowledge of use of DME;Pain       PT Treatment Interventions DME instruction;Gait training;Functional mobility training;Therapeutic activities;Therapeutic exercise;Patient/family education;Balance training    PT Goals (Current goals can be found in the Care Plan section)   Acute Rehab PT Goals Patient Stated Goal: feel better PT Goal Formulation: With patient Time For Goal Achievement: 11/18/18 Potential to Achieve Goals: Fair    Frequency Min 3X/week   Barriers to discharge        Co-evaluation               AM-PAC PT "6 Clicks" Mobility  Outcome Measure Help needed turning from your back to your side while in a flat bed without using bedrails?: A Little Help needed moving from lying on your back to sitting on the side of a flat bed without using bedrails?: A Little Help needed moving to and from a bed to a chair (including a wheelchair)?: A Little Help needed standing up from a chair using your arms (e.g., wheelchair or bedside chair)?: A Lot Help needed to walk in hospital room?: A Little Help needed climbing 3-5 steps with a railing? : A Lot 6 Click Score: 16    End of Session   Activity Tolerance: Patient limited by pain;Patient limited by fatigue Patient left: in bed;with call bell/phone within reach;with family/visitor present Nurse Communication: Mobility status PT Visit Diagnosis: Other abnormalities of gait and mobility (R26.89);Pain Pain - Right/Left: Left Pain - part of body: Arm    Time: [id number] PT Time Calculation (min) (ACUTE ONLY): 21 min   Charges:   PT Evaluation $PT Eval Moderate Complexity: [place],  PT, DPT Acute Rehabilitation Services  Pager [phone] Office [phone]  Derry Lory 11/04/2018, 12:22 PM

## 2018-11-04 NOTE — Progress Notes (Signed)
PROGRESS NOTE    Kristy Harris  [id number] DOB: 1945/08/17 DOA: 11/02/2018 PCP: Bridget Hartshorn, NP   Brief Narrative:  73 year old female with history of diabetes mellitus type 2, essential hypertension, COPD came to the ER with complaints of left arm pain and swelling for the past 6 days prior to admission.  She was admitted for treatment of severe cellulitis and myositis.  Orthopedic team following.   Assessment & Plan:   Active Problems:   Swelling of left hand   Pressure injury of skin  Left arm severe cellulitis without pruritus and myositis -No obvious fluid collection.  Continue IV vancomycin; eventually transition to 14 days of oral antibiotics -MRI shows diffuse myositis and cellulitis without evidence of osteomyelitis -Seen by hand surgery -Continue conservative management - Arm elevation and sling.  History of COPD -Stable.  Bronchodilators PRN  History of breast cancer -Follows outpatient Dr. Delton Coombes.  Status post lumpectomy/chemo and radiation.  On anastrozole.  Diabetes mellitus type 2 -Hemoglobin A1c 7.1.  Continue home glipizide.  Metformin on hold. -Sliding scale.  Essential hypertension -Appears to be stable.  Continue losartan.  Verapamil and metoprolol    DVT prophylaxis: Lovenox Code Status: Full code Family Communication: None Disposition Plan: Maintain hospital stay for IV vancomycin given extent of the cellulitis and myositis.  High risk for worsening of this, requires IV antibiotics.  Once adequate amount of improvement has been noted, will transition to oral antibiotics  Consultants:   Orthopedic  Procedures:   None  Antimicrobials:   Vancomycin   Subjective: Her left upper extremity feels slightly better.  Tells me it is little less swollen today.  Review of Systems Otherwise negative except as per HPI, including: General: Denies fever, chills, night sweats or unintended weight loss. Resp: Denies cough, wheezing,  shortness of breath. Cardiac: Denies chest pain, palpitations, orthopnea, paroxysmal nocturnal dyspnea. GI: Denies abdominal pain, nausea, vomiting, diarrhea or constipation GU: Denies dysuria, frequency, hesitancy or incontinence MS: Denies muscle aches, joint pain or swelling Neuro: Denies headache, neurologic deficits (focal weakness, numbness, tingling), abnormal gait Psych: Denies anxiety, depression, SI/HI/AVH Skin: Denies new rashes or lesions ID: Denies sick contacts, exotic exposures, travel  Objective: Vitals:   11/03/18 1458 11/03/18 2347 11/04/18 0549 11/04/18 0828  BP: (!) 154/69 (!) 151/64 (!) 144/69 132/66  Pulse: 82 92 90 89  Resp: 18 18  18   Temp: 99.3 F (37.4 C) 98.9 F (37.2 C) 98.6 F (37 C) 98.5 F (36.9 C)  TempSrc: Oral Oral Oral Oral  SpO2: 91% 91% 90% 94%  Weight:      Height:        Intake/Output Summary (Last 24 hours) at 11/04/2018 1157 Last data filed at 11/04/2018 0739 Gross per 24 hour  Intake 970 ml  Output 1500 ml  Net -530 ml   Filed Weights   11/02/18 1230  Weight: 127 kg    Examination:  General exam: Appears calm and comfortable  Respiratory system: Clear to auscultation. Respiratory effort normal. Cardiovascular system: S1 & S2 heard, RRR. No JVD, murmurs, rubs, gallops or clicks. No pedal edema. Gastrointestinal system: Abdomen is nondistended, soft and nontender. No organomegaly or masses felt. Normal bowel sounds heard. Central nervous system: Alert and oriented. No focal neurological deficits. Extremities: Symmetric 5 x 5 power.  Her left hand swelling appears to have improved and able to make fist Skin: Left upper extremity dressing in place. Psychiatry: Judgement and insight appear normal. Mood & affect appropriate.  Data Reviewed:   CBC: Recent Labs  Lab 11/02/18 1304 11/03/18 0041 11/04/18 0252  WBC 8.1 8.5 7.9  NEUTROABS 5.7  --   --   HGB 10.7* 10.4* 9.8*  HCT 35.6* 34.4* 32.5*  MCV 87.3 87.3 86.0  PLT  290 309 [id number]   Basic Metabolic Panel: Recent Labs  Lab 11/02/18 1304 11/04/18 0252  NA 137 135  K 3.5 3.6  CL 101 98  CO2 27 27  GLUCOSE 156* 110*  BUN 12 7*  CREATININE 0.63 0.62  CALCIUM 8.5* 7.9*   GFR: Estimated Creatinine Clearance: 86.4 mL/min (by C-G formula based on SCr of 0.62 mg/dL). Liver Function Tests: Recent Labs  Lab 11/02/18 1304  AST 15  ALT 15  ALKPHOS 84  BILITOT 0.3  PROT 7.9  ALBUMIN 3.3*   No results for input(s): LIPASE, AMYLASE in the last 168 hours. No results for input(s): AMMONIA in the last 168 hours. Coagulation Profile: No results for input(s): INR, PROTIME in the last 168 hours. Cardiac Enzymes: No results for input(s): CKTOTAL, CKMB, CKMBINDEX, TROPONINI in the last 168 hours. BNP (last 3 results) No results for input(s): PROBNP in the last 8760 hours. HbA1C: Recent Labs    11/03/18 0041  HGBA1C 7.1*   CBG: No results for input(s): GLUCAP in the last 168 hours. Lipid Profile: No results for input(s): CHOL, HDL, LDLCALC, TRIG, CHOLHDL, LDLDIRECT in the last 72 hours. Thyroid Function Tests: No results for input(s): TSH, T4TOTAL, FREET4, T3FREE, THYROIDAB in the last 72 hours. Anemia Panel: No results for input(s): VITAMINB12, FOLATE, FERRITIN, TIBC, IRON, RETICCTPCT in the last 72 hours. Sepsis Labs: No results for input(s): PROCALCITON, LATICACIDVEN in the last 168 hours.  Recent Results (from the past 240 hour(s))  Body fluid culture     Status: None (Preliminary result)   Collection Time: 11/02/18  1:04 PM   Specimen: BLOOD LEFT WRIST; Body Fluid  Result Value Ref Range Status   Specimen Description   Final    BLOOD LEFT WRIST Performed at [facility], [address], [place], [place] [address]    Special Requests   Final    Normal Performed at [facility], [address] [place], [place] [address]    Gram Stain PENDING  Incomplete   Culture   Final    NO GROWTH 2 DAYS Performed at [facility], [place]  [address] [place], [place] [address]    Report Status PENDING  Incomplete  SARS Coronavirus 2 [facility] order, Performed in [facility] hospital lab) Nasopharyngeal Nasopharyngeal Swab     Status: None   Collection Time: 11/02/18  3:48 PM   Specimen: Nasopharyngeal Swab  Result Value Ref Range Status   SARS Coronavirus 2 NEGATIVE NEGATIVE Final    Comment: (NOTE) If result is NEGATIVE SARS-CoV-2 target nucleic acids are NOT DETECTED. The SARS-CoV-2 RNA is generally detectable in upper and lower  respiratory specimens during the acute phase of infection. The lowest  concentration of SARS-CoV-2 viral copies this assay can detect is 250  copies / mL. A negative result does not preclude SARS-CoV-2 infection  and should not be used as the sole basis for treatment or other  patient management decisions.  A negative result may occur with  improper specimen collection / handling, submission of specimen other  than nasopharyngeal swab, presence of viral mutation(s) within the  areas targeted by this assay, and inadequate number of viral copies  (<250 copies / mL). A negative result must be  combined with clinical  observations, patient history, and epidemiological information. If result is POSITIVE SARS-CoV-2 target nucleic acids are DETECTED. The SARS-CoV-2 RNA is generally detectable in upper and lower  respiratory specimens dur ing the acute phase of infection.  Positive  results are indicative of active infection with SARS-CoV-2.  Clinical  correlation with patient history and other diagnostic information is  necessary to determine patient infection status.  Positive results do  not rule out bacterial infection or co-infection with other viruses. If result is PRESUMPTIVE POSTIVE SARS-CoV-2 nucleic acids MAY BE PRESENT.   A presumptive positive result was obtained on the submitted specimen  and confirmed on repeat testing.  While 2019 novel coronavirus  (SARS-CoV-2) nucleic acids may be present  in the submitted sample  additional confirmatory testing may be necessary for epidemiological  and / or clinical management purposes  to differentiate between  SARS-CoV-2 and other Sarbecovirus currently known to infect humans.  If clinically indicated additional testing with an alternate test  methodology [phone]) is advised. The SARS-CoV-2 RNA is generally  detectable in upper and lower respiratory sp ecimens during the acute  phase of infection. The expected result is Negative. Fact Sheet for Patients:  StrictlyIdeas.no Fact Sheet for Healthcare Providers: BankingDealers.co.za This test is not yet approved or cleared by the [place] FDA and has been authorized for detection and/or diagnosis of SARS-CoV-2 by FDA under an Emergency Use Authorization (EUA).  This EUA will remain in effect (meaning this test can be used) for the duration of the COVID-19 declaration under Section 564(b)(1) of the Act, 21 U.S.C. section 360bbb-3(b)(1), unless the authorization is terminated or revoked sooner. Performed at [facility], [address] [place], [place] [address]          Radiology Studies: Mr Hand Left Wo Contrast  Result Date: 11/03/2018 CLINICAL DATA:  Soft tissue infection suspected, hand swelling EXAM: MRI OF THE LEFT HAND WITHOUT CONTRAST TECHNIQUE: Multiplanar, multisequence MR imaging of the was performed. No intravenous contrast was administered. COMPARISON:  None. FINDINGS: Bones/Joint/Cartilage There is increased T2 signal seen throughout the carpals. This is most notable in the capitate and the hamate with subtly decreased T1 hypointensity. However there is no areas of cortical destruction or periosteal reaction. No bony erosions. The remainder of the marrow signal appears to be normal in appearance. There is mild joint space loss seen at the first [facility] joint with subchondral cystic change. No large joint effusion is seen.  Ligaments The collateral ligaments are intact. The visualized portion of the sagittal bands, pulleys, and volar plates are intact. Muscles and Tendons Normal signal seen throughout the muscles. No muscular edema, tear, or atrophy. The flexor and extensor tendons are normal in signal intensity and morphology. Soft tissues Diffuse subcutaneous edema is seen throughout the hand with overlying dorsal skin thickening. There is increased feathery signal seen within muscles surrounding the hand. No focal muscular tear is seen. IMPRESSION: 1. Findings of diffuse cellulitis and myositis. 2. Probable reactive marrow seen throughout the carpals without definite evidence of osteomyelitis. Electronically Signed   By: Prudencio Pair M.D.   On: 11/03/2018 08:02        Scheduled Meds:  anastrozole  1 mg Oral Daily   atorvastatin  20 mg Oral q morning - 10a   enoxaparin (LOVENOX) injection  40 mg Subcutaneous Q24H   glipiZIDE  10 mg Oral Q breakfast   losartan  100 mg Oral Daily   metoprolol succinate  100 mg Oral Daily  verapamil  120 mg Oral Daily   Continuous Infusions:  vancomycin 1,250 mg (11/03/18 2243)     LOS: 1 day   Time spent= 35 mins    Hjalmar Ballengee Arsenio Loader, MD [facility]  If 7PM-7AM, please contact night-coverage [email or web address] 11/04/2018, 11:57 AM

## 2018-11-04 NOTE — Evaluation (Signed)
Occupational Therapy Evaluation Patient Details Name: Kristy Harris MRN: [id number] DOB: December 28, 1945 Today's Date: 11/04/2018    History of Present Illness 73 y.o. female with medical history significant for diabetes mellitus, hypertension, hydradenitis 2.  Fever, COPD, who presented to ED with complaints of pain and swelling of her left hand of 6 days duration.   Clinical Impression   Pt admitted with the above diagnoses and presents with below problem list. Pt will benefit from continued acute OT to address the below listed deficits and maximize independence with basic ADLs prior to d/c home. PTA pt was needing some assistance with bathing/dressing and tub shower transfers. Pt reports lives alone but has an aide 5 days a week for 3 hours each day, family assist on the weekends. Pt is currently max A with UB/LB ADLs, min - mod A with functional transfers, +2 assist for safety (close chair follow) to walk to bathroom. Pt struggled with mobility to bathroom using her straight cane and needed to utilized wheeled recliner to return to room. Discussed edema control and elevation of LUE.      Follow Up Recommendations  Home health OT;Supervision - Intermittent(OOB/mobility)    Equipment Recommendations  None recommended by OT    Recommendations for Other Services PT consult     Precautions / Restrictions Precautions Precautions: Fall Required Braces or Orthoses: (sling ordered for safety with walking) Restrictions Weight Bearing Restrictions: No Other Position/Activity Restrictions: encourage LUE elevation for edema control      Mobility Bed Mobility Overal bed mobility: Needs Assistance Bed Mobility: Supine to Sit     Supine to sit: Min assist;HOB elevated     General bed mobility comments: Extra time and effort. Used bed rails on both side. Pt reports she has 1 bed rail at home. Min A to fully powerup and to steady.  Transfers Overall transfer level: Needs  assistance Equipment used: Straight cane Transfers: Sit to/from Stand Sit to Stand: Mod assist;Min assist         General transfer comment: from elevated seat heights. Assist to power up, steady, and control descent.    Balance Overall balance assessment: Needs assistance Sitting-balance support: Single extremity supported;No upper extremity supported;Feet supported Sitting balance-Leahy Scale: Good     Standing balance support: Single extremity supported Standing balance-Leahy Scale: Poor Standing balance comment: pt struggled with maintaining balance using only straight cane. Pt reluctant to use rw "I think that's what caused (the LUE edema/pain)" Advised pt to discuss this with MD.                           ADL either performed or assessed with clinical judgement   ADL Overall ADL's : Needs assistance/impaired Eating/Feeding: Set up;Sitting   Grooming: Moderate assistance;Sitting   Upper Body Bathing: Maximal assistance;Sitting   Lower Body Bathing: Maximal assistance;Sit to/from stand   Upper Body Dressing : Maximal assistance;Sitting   Lower Body Dressing: Maximal assistance;Sit to/from stand   Toilet Transfer: Moderate assistance;Ambulation;Minimal assistance(straight cane, BSC over toilet) Toilet Transfer Details (indicate cue type and reason): struggled with ambulation to bathroom using her straight cane. Dicussed recommendation for PT eval. Toileting- Clothing Manipulation and Hygiene: Sit to/from stand;Minimal assistance;Sitting/lateral lean   Tub/ Shower Transfer: Tub transfer;Moderate assistance;Ambulation;Shower Scientist, research (medical) Details (indicate cue type and reason): clnical judgement Functional mobility during ADLs: Minimal assistance;+2 for safety/equipment;Cane(close chair follow utilized. pt unable to walk back to room) General ADL Comments: Pt completed bed mobility, struggled to  walk to bathroom, toilet transfer, pericare. Pt then  transferred to recliner to be wheeled back to room due to fatigue.      Vision         Perception     Praxis      Pertinent Vitals/Pain Pain Assessment: Faces Faces Pain Scale: Hurts little more Pain Location: L elbow and distal, sensitive to touch Pain Descriptors / Indicators: Grimacing;Aching Pain Intervention(s): Monitored during session;Repositioned;Limited activity within patient's tolerance     Hand Dominance Right   Extremity/Trunk Assessment Upper Extremity Assessment Upper Extremity Assessment: LUE deficits/detail;Generalized weakness(baseline arthritis ) LUE Deficits / Details: painful to touch elbow and distal. Pt able to wiggle digits. Unable to flex digits or wrist, baseline due to arthritis per pt. Pt passively moving LUE. R hand on L hand to raise for pillow placement and line management. LUE: Unable to fully assess due to pain LUE Coordination: decreased fine motor;decreased gross motor   Lower Extremity Assessment Lower Extremity Assessment: Defer to PT evaluation;Generalized weakness("bad knees")       Communication Communication Communication: No difficulties   Cognition Arousal/Alertness: Awake/alert Behavior During Therapy: WFL for tasks assessed/performed Overall Cognitive Status: Within Functional Limits for tasks assessed                                     General Comments       Exercises     Shoulder Instructions      Home Living Family/patient expects to be discharged to:: Private residence Living Arrangements: Children Available Help at Discharge: Family   Home Access: Ramped entrance     Home Layout: One level     Bathroom Shower/Tub: Teacher, early years/pre: Handicapped height     Home Equipment: Environmental consultant - 2 wheels;Walker - 4 wheels;Cane - single point;Shower seat;Grab bars - toilet;Grab bars - tub/shower(bed rail)          Prior Functioning/Environment Level of Independence: Needs assistance   Gait / Transfers Assistance Needed: rw vs cane at baseline ADL's / [place] Needed: aide 5 days a week for 3 hours. daughter assist on weekends. Assist for tub shower transfer and some assistance needed for b/d as well.             OT Problem List: Decreased activity tolerance;Impaired balance (sitting and/or standing);Decreased knowledge of use of DME or AE;Decreased knowledge of precautions;Obesity;Impaired UE functional use;Pain;Increased edema      OT Treatment/Interventions: Self-care/ADL training;Therapeutic exercise;Energy conservation;DME and/or AE instruction;Therapeutic activities;Patient/family education;Balance training    OT Goals(Current goals can be found in the care plan section) Acute Rehab OT Goals Patient Stated Goal: feel better OT Goal Formulation: With patient Time For Goal Achievement: 11/18/18 Potential to Achieve Goals: Good ADL Goals Pt Will Perform Upper Body Bathing: with min assist;sitting Pt Will Perform Lower Body Bathing: with min assist;sit to/from stand Pt Will Perform Upper Body Dressing: with min assist;sitting Pt Will Perform Lower Body Dressing: with min assist;sit to/from stand Pt Will Transfer to Toilet: with min guard assist;ambulating Pt Will Perform Toileting - Clothing Manipulation and hygiene: with min guard assist;sit to/from stand;sitting/lateral leans;with supervision;with set-up Pt Will Perform Tub/Shower Transfer: Tub transfer;with mod assist;ambulating;shower seat;rolling walker Additional ADL Goal #1: Pt will be independent with LUE edema control education.  OT Frequency: Min 2X/week   Barriers to D/C:            Co-evaluation  AM-PAC OT "6 Clicks" Daily Activity     Outcome Measure Help from another person eating meals?: A Little Help from another person taking care of personal grooming?: A Little Help from another person toileting, which includes using toliet, bedpan, or urinal?: A Lot Help  from another person bathing (including washing, rinsing, drying)?: A Lot Help from another person to put on and taking off regular upper body clothing?: A Lot Help from another person to put on and taking off regular lower body clothing?: A Lot 6 Click Score: 14   End of Session Equipment Utilized During Treatment: Gait belt;Other (comment)(straight cane) Nurse Communication: Mobility status;Other (comment)(sling ordered and for mobility only)  Activity Tolerance: Patient limited by fatigue Patient left: in chair;with call bell/phone within reach  OT Visit Diagnosis: Unsteadiness on feet (R26.81);Other abnormalities of gait and mobility (R26.89);Muscle weakness (generalized) (M62.81);Pain Pain - Right/Left: Left Pain - part of body: Arm;Hand                Time: [id number] OT Time Calculation (min): 25 min Charges:  OT General Charges $OT Visit: 1 Visit OT Evaluation $OT Eval Low Complexity: 1 Low OT Treatments $Self Care/Home Management : 8-22 mins  Tyrone Schimke, OT Acute Rehabilitation Services Pager: [phone] Office: [phone]   Hortencia Pilar 11/04/2018, 11:50 AM

## 2018-11-04 NOTE — Progress Notes (Signed)
   Verbal order received from Dr. Amedeo Plenty for L shoulder sling and ortho tech paged. Pt is to only have sling on for safety with walking. Sling needs to be off otherwise (ex. Laying in bed, sitting in recliner).  Tyrone Schimke, OT Acute Rehabilitation Services Pager: [phone] Office: [phone]

## 2018-11-04 NOTE — Consult Note (Signed)
Markedly improved exam today.  The patient is alert and oriented at bedside.  She still has limited motion in her wrist which she owes to arthritis and states she is pre-existing.  The cellulitis is markedly improving.  Once again the MRI has been seen and reviewed and I do not see any evidence a surgical lesion that would require debridement.  I would continue treatment for cellulitis with IV antibiotics and ultimately transition to a 14-day course of oral antibiotics.  Elevation range of motion massage and other measures are still been to be very important and we discussed this.  Should any problems arise she will notify me.  Is been a pleasure to see her today and discussed with the patient her care plan.  Should any problems arise she will notify me.  Once again I do feel the patient looks quite well in terms of the cellulitic improvement.  We discussed this at length at her bedside.  I also discussed with her nursing staff that she is okay to take a shower and then change the bandage today.  We will continue elevation and hopefully she can be transition to outpatient p.o. antibiotics tomorrow or so.  Certainly I do feel that her progress has been stable.  If there is any worsening I will be available.  Kristy Jarnagin MD

## 2018-11-04 NOTE — Plan of Care (Signed)

## 2018-11-04 NOTE — Plan of Care (Signed)
  Problem: Pain Managment: Goal: General experience of comfort will improve Outcome: Progressing   Problem: Elimination: Goal: Will not experience complications related to bowel motility Outcome: Progressing   

## 2018-11-04 NOTE — Progress Notes (Signed)
Orthopedic Tech Progress Note Patient Details:  Kristy Harris 1945/08/21 [id number] Therapy was in there at the time I was getting ready to apply the shoulder sling but she said she got it. Ortho Devices Type of Ortho Device: Shoulder immobilizer Ortho Device/Splint Location: ULL Ortho Device/Splint Interventions: Other (comment)   Post Interventions Patient Tolerated: Other (comment) Instructions Provided: Other (comment)   Janit Pagan 11/04/2018, 11:52 AM

## 2018-11-05 LAB — GLUCOSE, CAPILLARY
Glucose-Capillary: 93 mg/dL (ref 70–99)
Glucose-Capillary: 136 mg/dL — ABNORMAL HIGH (ref 70–99)
Glucose-Capillary: 130 mg/dL — ABNORMAL HIGH (ref 70–99)

## 2018-11-05 LAB — BASIC METABOLIC PANEL
Anion gap: 9 (ref 5–15)
BUN: 8 mg/dL (ref 8–23)
CO2: 26 mmol/L (ref 22–32)
Calcium: 8.2 mg/dL — ABNORMAL LOW (ref 8.9–10.3)
Chloride: 101 mmol/L (ref 98–111)
Creatinine, Ser: 0.59 mg/dL (ref 0.44–1.00)
GFR calc Af Amer: >60 mL/min (ref >60)
GFR calc non Af Amer: >60 mL/min (ref >60)
Glucose, Bld: 105 mg/dL — ABNORMAL HIGH (ref 70–99)
Potassium: 3.6 mmol/L (ref 3.5–5.1)
Sodium: 136 mmol/L (ref 135–145)

## 2018-11-05 LAB — CBC
HCT: 32.5 % — ABNORMAL LOW (ref 36.0–46.0)
Hemoglobin: 9.9 g/dL — ABNORMAL LOW (ref 12.0–15.0)
MCH: 26.1 pg (ref 26.0–34.0)
MCHC: 30.5 g/dL (ref 30.0–36.0)
MCV: 85.5 fL (ref 80.0–100.0)
Platelets: 291 10*3/uL (ref 150–400)
RBC: 3.8 MIL/uL — ABNORMAL LOW (ref 3.87–5.11)
RDW: 14.8 % (ref 11.5–15.5)
WBC: 8.1 10*3/uL (ref 4.0–10.5)
nRBC: 0 % (ref 0.0–0.2)

## 2018-11-05 LAB — MAGNESIUM: Magnesium: 2.1 mg/dL (ref 1.7–2.4)

## 2018-11-05 MED ORDER — INSULIN ASPART 100 UNIT/ML ~~LOC~~ SOLN: 0.0000 [IU] | Freq: Every day | SUBCUTANEOUS | Status: DC

## 2018-11-05 MED ORDER — INSULIN ASPART 100 UNIT/ML ~~LOC~~ SOLN
0.0000 [IU] | Freq: Three times a day (TID) | SUBCUTANEOUS | Status: DC
  Administered 2018-11-05: 17:00:00 2 [IU] via SUBCUTANEOUS

## 2018-11-05 NOTE — Progress Notes (Signed)
Patient ID: Kristy Harris, female   DOB: 04/17/1945, 73 y.o.   MRN: [id number] Patient seen at bedside.  Patient has marked improvement.  Cellulitic changes have resolved.  She has improved range of motion about the wrist and fingers.  Unfortunately she has significant pre-existing issues about the hand in terms of loss of motion due to arthritic change over the last 2 years.  She states she is about back to her baseline status.  I reviewed this with her at length and the findings.  At present juncture we will plan for continued elevation and I do feel tomorrow she can be discharged home on oral antibiotics.  I discussed her all issues at length and all questions have been addressed. The patient is alert and oriented in no acute distress. The patient complains of pain in the affected upper extremity.  The patient is noted to have a normal HEENT exam. Lung fields show equal chest expansion and no shortness of breath. Abdomen exam is nontender without distention. Lower extremity examination does not show any fracture dislocation or blood clot symptoms. Pelvis is stable and the neck and back are stable and nontender. Adaiah Jaskot MD

## 2018-11-05 NOTE — Progress Notes (Signed)
Pharmacy Antibiotic Note  Kristy Harris is a 73 y.o. female admitted on 11/02/2018 with L hand infection/severe cellulitis. Tm 100F WBC wnl Cr stable 0.59 Pharmacy consulted for vancomycin dosing.  Plan: Cont Vancomycin 1250mg  q12h  Scr 0.6 Est AUC 533 Consider vanc levels if worsens  Height: 5\' 4"  (162.6 cm) Weight: 280 lb (127 kg) IBW/kg (Calculated) : 54.7  Temp (24hrs), Avg:98.9 F (37.2 C), Min:98.2 F (36.8 C), Max:100 F (37.8 C)  Recent Labs  Lab 11/02/18 1304 11/03/18 0041 11/04/18 0252 11/05/18 0435  WBC 8.1 8.5 7.9 8.1  CREATININE 0.63  --  0.62 0.59    Estimated Creatinine Clearance: 86.4 mL/min (by C-G formula based on SCr of 0.59 mg/dL).    Allergies  Allergen Reactions  . Lisinopril Swelling and Cough    Mouth swelling  . Penicillins Nausea And Vomiting and Rash    .Did it involve swelling of the face/tongue/throat, SOB, or low BP? Yes Did it involve sudden or severe rash/hives, skin peeling, or any reaction on the inside of your mouth or nose? Yes Did you need to seek medical attention at a hospital or doctor's office? Yes When did it last happen? If all above answers are "NO", may proceed with cephalosporin use.     Antimicrobials this admission: Clindamycin x1 in ED 9/1  Dose adjustments this admission:   Microbiology results: 9/1 Bcx left wrist: ngtd  Gwenlyn Fudge - Student PharmD 11/05/2018 10:15 AM

## 2018-11-05 NOTE — Progress Notes (Signed)
Occupational Therapy Treatment Patient Details Name: Kristy Harris MRN: [id number] DOB: May 24, 1945 Today's Date: 11/05/2018    History of present illness Pt is a 73 y.o. female admitted 11/02/18 with L hand pain and swelling. Worked up for LUE severe cellulitis and myositis; plan for conservative management. PMH includes DM, HTN, COPD, obesity.   OT comments  Pt MOD I for bed mobility heavily utilizing all bed features to transition to EOB but no physical assist required. Pt attempt sit>stand from elevated surface with RW and MAX A as precursor to standing ADLs. Despite several attempts pt unable to stand d/t pain in feet and fatigue. Pt feet began to slip and therapist assisted pt into supine MAX A. Pt limited by pain this session and unable to assess further ADLs. Still feel pt appropriate to return home with [facility] as pt reports she has family support at home. Wil continue to follow for acute OT needs.   Follow Up Recommendations  Home health OT;Supervision - Intermittent    Equipment Recommendations  None recommended by OT    Recommendations for Other Services      Precautions / Restrictions Precautions Precautions: Fall Restrictions Weight Bearing Restrictions: No Other Position/Activity Restrictions: encourage LUE elevation for edema control       Mobility Bed Mobility Overal bed mobility: Needs Assistance Bed Mobility: Supine to Sit     Supine to sit: Modified independent (Device/Increase time)     General bed mobility comments: able to transfer supine EOB MOD I with complete dependence on bed features but no physical assist needed  Transfers Overall transfer level: Needs assistance Equipment used: Straight cane;Rolling walker (2 wheeled) Transfers: Sit to/from Stand Sit to Stand: Max assist;From elevated surface         General transfer comment: attempted sit >stand from EOB with elevated surafce MAX A; unable to complete d/t pt reports of pain in feet and unable  to stand; pt feet began to slip had to lay pt back down quickly    Balance Overall balance assessment: Needs assistance Sitting-balance support: Feet unsupported;Single extremity supported Sitting balance-Leahy Scale: Poor Sitting balance - Comments: relaint on therapist assist/ unilateral extremity support to maintain sitting balance at EOB       Standing balance comment: unable to stand this date                           ADL either performed or assessed with clinical judgement   ADL Overall ADL's : Needs assistance/impaired                           Toilet Transfer Details (indicate cue type and reason): unable to perform d/t paiin feet when attempting to sit>stand from EOB; had to lay back down as pts feet were slipping and could not stand despite MAX A from therapist           General ADL Comments: unable to perform ADLs d/t pain in feet; suspect MAX A d/t decreased activity tolerance     Vision       Perception     Praxis      Cognition Arousal/Alertness: Awake/alert Behavior During Therapy: WFL for tasks assessed/performed Overall Cognitive Status: Within Functional Limits for tasks assessed  General Comments: adamant about directing level of care during session stating. " I know what I can and can't do"        Exercises     Shoulder Instructions       General Comments      Pertinent Vitals/ Pain       Pain Score: 5  Pain Location: L arm Pain Descriptors / Indicators: Grimacing;Aching;Guarding Pain Intervention(s): Monitored during session;Repositioned  Home Living                                          Prior Functioning/Environment              Frequency  Min 2X/week        Progress Toward Goals  OT Goals(current goals can now be found in the care plan section)  Progress towards OT goals: Progressing toward goals  Acute Rehab OT Goals Time For  Goal Achievement: 11/18/18 Potential to Achieve Goals: Good  Plan      Co-evaluation                 AM-PAC OT "6 Clicks" Daily Activity     Outcome Measure   Help from another person eating meals?: A Little Help from another person taking care of personal grooming?: A Lot Help from another person toileting, which includes using toliet, bedpan, or urinal?: A Lot Help from another person bathing (including washing, rinsing, drying)?: A Lot Help from another person to put on and taking off regular upper body clothing?: A Lot Help from another person to put on and taking off regular lower body clothing?: A Lot 6 Click Score: 13    End of Session Equipment Utilized During Treatment: Gait belt;Other (comment);Rolling walker(straight cane)  OT Visit Diagnosis: Unsteadiness on feet (R26.81);Other abnormalities of gait and mobility (R26.89);Muscle weakness (generalized) (M62.81);Pain   Activity Tolerance Patient limited by fatigue;Patient limited by pain   Patient Left in bed;with chair alarm set;with nursing/sitter in room   Nurse Communication          Time: 1102-1130 OT Time Calculation (min): 28 min  Charges: OT General Charges $OT Visit: 1 Visit OT Treatments $Therapeutic Activity: 23-37 mins     Ihor Gully, COTA/L 11/05/2018, 11:42 AM [phone]

## 2018-11-05 NOTE — Progress Notes (Signed)
PROGRESS NOTE    Kristy Harris  [id number] DOB: 1946/01/09 DOA: 11/02/2018 PCP: Bridget Hartshorn, NP   Brief Narrative:  73 year old female with history of diabetes mellitus type 2, essential hypertension, COPD came to the ER with complaints of left arm pain and swelling for the past 6 days prior to admission.  She was admitted for treatment of severe cellulitis and myositis.  Orthopedic team following.   Assessment & Plan:   Active Problems:   Swelling of left hand   Pressure injury of skin  Left arm severe cellulitis without pruritus and myositis -No obvious fluid collection.  Continue IV vancomycin, eventually transition to 14 days of oral antibiotics.  Adjust vancomycin as necessary. -MRI shows diffuse myositis and cellulitis without evidence of osteomyelitis -Seen by hand surgery -Continue conservative management - Sling in place, arm elevation  History of COPD -Stable.  Bronchodilators PRN  History of breast cancer -Follows outpatient Dr. Delton Coombes.  Status post lumpectomy/chemo and radiation.  On anastrozole.  Diabetes mellitus type 2 -Hemoglobin A1c 7.1.  Continue home glipizide.  Metformin on hold. -Sliding scale.  Essential hypertension -Appears to be stable.  Continue losartan.  Verapamil and metoprolol    DVT prophylaxis: Lovenox Code Status: Full code Family Communication: None Disposition Plan: Maintain hospital stay for IV vancomycin given extent of cellulitis and myositis.  Hopefully we can transition her home in next 24-48 hours on oral antibiotics.  Consultants:   Orthopedic  Procedures:   None  Antimicrobials:   Vancomycin   Subjective: Still has some pain in the left upper extremity but improved from yesterday.  Currently she has it in the sling and it is elevated  Review of Systems Otherwise negative except as per HPI, including: General = no fevers, chills, dizziness, malaise, fatigue HEENT/EYES = negative for pain,  redness, loss of vision, double vision, blurred vision, loss of hearing, sore throat, hoarseness, dysphagia Cardiovascular= negative for chest pain, palpitation, murmurs, lower extremity swelling Respiratory/lungs= negative for shortness of breath, cough, hemoptysis, wheezing, mucus production Gastrointestinal= negative for nausea, vomiting,, abdominal pain, melena, hematemesis Genitourinary= negative for Dysuria, Hematuria, Change in Urinary Frequency MSK = Negative for arthralgia, myalgias, Back Pain, Joint swelling  Neurology= Negative for headache, seizures, numbness, tingling  Psychiatry= Negative for anxiety, depression, suicidal and homocidal ideation Allergy/Immunology= Medication/Food allergy as listed  Skin= Negative for Rash, lesions, ulcers, itching   Objective: Vitals:   11/04/18 1500 11/04/18 2014 11/05/18 0618 11/05/18 0733  BP:  (!) 139/58 120/61 139/83  Pulse:  85 85 87  Resp:  14 14 18   Temp: 99.6 F (37.6 C) 98.3 F (36.8 C) 98.6 F (37 C) 98.2 F (36.8 C)  TempSrc: Oral Oral Oral Oral  SpO2:  99% 99% 95%  Weight:      Height:        Intake/Output Summary (Last 24 hours) at 11/05/2018 1149 Last data filed at 11/05/2018 1046 Gross per 24 hour  Intake 960 ml  Output 3450 ml  Net -2490 ml   Filed Weights   11/02/18 1230  Weight: 127 kg    Examination:  Constitutional: NAD, calm, comfortable Eyes: PERRL, lids and conjunctivae normal ENMT: Mucous membranes are moist. Posterior pharynx clear of any exudate or lesions.Normal dentition.  Neck: normal, supple, no masses, no thyromegaly Respiratory: clear to auscultation bilaterally, no wheezing, no crackles. Normal respiratory effort. No accessory muscle use.  Cardiovascular: Regular rate and rhythm, no murmurs / rubs / gallops. No extremity edema. 2+ pedal pulses. No carotid  bruits.  Abdomen: no tenderness, no masses palpated. No hepatosplenomegaly. Bowel sounds positive.  Musculoskeletal: no clubbing /  cyanosis. No joint deformity upper and lower extremities. Good ROM, no contractures. Normal muscle tone.  Skin: Left upper extremity dressing in place, currently it is in the sling and elevated. Neurologic: CN 2-12 grossly intact. Sensation intact, DTR normal. Strength 5/5 in all 4.  Psychiatric: Normal judgment and insight. Alert and oriented x 3. Normal mood.   Data Reviewed:   CBC: Recent Labs  Lab 11/02/18 1304 11/03/18 0041 11/04/18 0252 11/05/18 0435  WBC 8.1 8.5 7.9 8.1  NEUTROABS 5.7  --   --   --   HGB 10.7* 10.4* 9.8* 9.9*  HCT 35.6* 34.4* 32.5* 32.5*  MCV 87.3 87.3 86.0 85.5  PLT 290 309 291 [id number]   Basic Metabolic Panel: Recent Labs  Lab 11/02/18 1304 11/04/18 0252 11/05/18 0435  NA 137 135 136  K 3.5 3.6 3.6  CL 101 98 101  CO2 27 27 26   GLUCOSE 156* 110* 105*  BUN 12 7* 8  CREATININE 0.63 0.62 0.59  CALCIUM 8.5* 7.9* 8.2*  MG  --   --  2.1   GFR: Estimated Creatinine Clearance: 86.4 mL/min (by C-G formula based on SCr of 0.59 mg/dL). Liver Function Tests: Recent Labs  Lab 11/02/18 1304  AST 15  ALT 15  ALKPHOS 84  BILITOT 0.3  PROT 7.9  ALBUMIN 3.3*   No results for input(s): LIPASE, AMYLASE in the last 168 hours. No results for input(s): AMMONIA in the last 168 hours. Coagulation Profile: No results for input(s): INR, PROTIME in the last 168 hours. Cardiac Enzymes: No results for input(s): CKTOTAL, CKMB, CKMBINDEX, TROPONINI in the last 168 hours. BNP (last 3 results) No results for input(s): PROBNP in the last 8760 hours. HbA1C: Recent Labs    11/03/18 0041  HGBA1C 7.1*   CBG: No results for input(s): GLUCAP in the last 168 hours. Lipid Profile: No results for input(s): CHOL, HDL, LDLCALC, TRIG, CHOLHDL, LDLDIRECT in the last 72 hours. Thyroid Function Tests: No results for input(s): TSH, T4TOTAL, FREET4, T3FREE, THYROIDAB in the last 72 hours. Anemia Panel: No results for input(s): VITAMINB12, FOLATE, FERRITIN, TIBC, IRON,  RETICCTPCT in the last 72 hours. Sepsis Labs: No results for input(s): PROCALCITON, LATICACIDVEN in the last 168 hours.  Recent Results (from the past 240 hour(s))  Body fluid culture     Status: None (Preliminary result)   Collection Time: 11/02/18  1:04 PM   Specimen: BLOOD LEFT WRIST; Body Fluid  Result Value Ref Range Status   Specimen Description   Final    BLOOD LEFT WRIST Performed at [facility], [address] [place], [place] [address]    Special Requests   Final    Normal Performed at [facility], [address] [place], [place] [address]    Gram Stain PENDING  Incomplete   Culture   Final    NO GROWTH 3 DAYS Performed at [facility], [place] [address], [place], [place] [address]    Report Status PENDING  Incomplete  SARS Coronavirus 2 [facility] order, Performed in [facility] hospital lab) Nasopharyngeal Nasopharyngeal Swab     Status: None   Collection Time: 11/02/18  3:48 PM   Specimen: Nasopharyngeal Swab  Result Value Ref Range Status   SARS Coronavirus 2 NEGATIVE NEGATIVE Final    Comment: (NOTE) If result is NEGATIVE SARS-CoV-2 target nucleic acids are NOT DETECTED. The SARS-CoV-2 RNA is generally  detectable in upper and lower  respiratory specimens during the acute phase of infection. The lowest  concentration of SARS-CoV-2 viral copies this assay can detect is 250  copies / mL. A negative result does not preclude SARS-CoV-2 infection  and should not be used as the sole basis for treatment or other  patient management decisions.  A negative result may occur with  improper specimen collection / handling, submission of specimen other  than nasopharyngeal swab, presence of viral mutation(s) within the  areas targeted by this assay, and inadequate number of viral copies  (<250 copies / mL). A negative result must be combined with clinical  observations, patient history, and epidemiological information. If result is POSITIVE SARS-CoV-2 target nucleic  acids are DETECTED. The SARS-CoV-2 RNA is generally detectable in upper and lower  respiratory specimens dur ing the acute phase of infection.  Positive  results are indicative of active infection with SARS-CoV-2.  Clinical  correlation with patient history and other diagnostic information is  necessary to determine patient infection status.  Positive results do  not rule out bacterial infection or co-infection with other viruses. If result is PRESUMPTIVE POSTIVE SARS-CoV-2 nucleic acids MAY BE PRESENT.   A presumptive positive result was obtained on the submitted specimen  and confirmed on repeat testing.  While 2019 novel coronavirus  (SARS-CoV-2) nucleic acids may be present in the submitted sample  additional confirmatory testing may be necessary for epidemiological  and / or clinical management purposes  to differentiate between  SARS-CoV-2 and other Sarbecovirus currently known to infect humans.  If clinically indicated additional testing with an alternate test  methodology [phone]) is advised. The SARS-CoV-2 RNA is generally  detectable in upper and lower respiratory sp ecimens during the acute  phase of infection. The expected result is Negative. Fact Sheet for Patients:  StrictlyIdeas.no Fact Sheet for Healthcare Providers: BankingDealers.co.za This test is not yet approved or cleared by the [place] FDA and has been authorized for detection and/or diagnosis of SARS-CoV-2 by FDA under an Emergency Use Authorization (EUA).  This EUA will remain in effect (meaning this test can be used) for the duration of the COVID-19 declaration under Section 564(b)(1) of the Act, 21 U.S.C. section 360bbb-3(b)(1), unless the authorization is terminated or revoked sooner. Performed at [facility], [address] [place], [place] [address]          Radiology Studies: No results found.      Scheduled Meds: . anastrozole  1 mg  Oral Daily  . atorvastatin  20 mg Oral q morning - 10a  . enoxaparin (LOVENOX) injection  40 mg Subcutaneous Q24H  . glipiZIDE  10 mg Oral Q breakfast  . losartan  100 mg Oral Daily  . metoprolol succinate  100 mg Oral Daily  . verapamil  120 mg Oral Daily   Continuous Infusions: . vancomycin 1,250 mg (11/05/18 0105)     LOS: 2 days   Time spent= 35 mins    Ankit Arsenio Loader, MD [facility]  If 7PM-7AM, please contact night-coverage [email or web address] 11/05/2018, 11:49 AM

## 2018-11-05 NOTE — TOC Initial Note (Addendum)
Transition of Care [facility]) - Initial/Assessment Note    Patient Details  Name: Kristy Harris MRN: [id number] Date of Birth: [birth year]  Transition of Care [facility]) CM/SW Contact:    Marilu Favre, RN Phone Number: 11/05/2018, 12:52 PM  Clinical Narrative:                  Confirmed face sheet information with patient . Patient from home her grandson stays with her. She already has walker, cane, wheelchair, bedside commode, shower chair at home  Received orders for HHRN,PT,OT,aide. Mateo Flow with [place] aware.  Expected Discharge Plan: [place] Barriers to Discharge: Continued Medical Work up   Patient Goals and CMS Choice Patient states their goals for this hospitalization and ongoing recovery are:: to go home CMS Medicare.gov Compare Post Acute Care list provided to:: Patient Choice offered to / list presented to : Patient  Expected Discharge Plan and Services Expected Discharge Plan: [place]   Discharge Planning Services: CM Consult Post Acute Care Choice: [place] arrangements for the past 2 months: Single Family Home                 DME Arranged: N/A         HH Arranged: PT, OT   Date HH Agency Contacted: 11/05/18 Time HH Agency Contacted: 1250 Representative spoke with at [place]: Garment/textile technologist , will need orders  Prior Living Arrangements/Services Living arrangements for the past 2 months: Single Family Home Lives with:: Other (Comment)(Adult grand son) Patient language and need for interpreter reviewed:: Yes Do you feel safe going back to the place where you live?: Yes      Need for Family Participation in Patient Care: Yes (Comment) Care giver support system in place?: Yes (comment) Current home services: Home OT, Home PT Criminal Activity/Legal Involvement Pertinent to Current Situation/Hospitalization: No - Comment as needed  Activities of Daily Living Home Assistive  Devices/Equipment: Cane (specify quad or straight)(straight) ADL Screening (condition at time of admission) Patient's cognitive ability adequate to safely complete daily activities?: Yes Is the patient deaf or have difficulty hearing?: No Does the patient have difficulty seeing, even when wearing glasses/contacts?: Yes Does the patient have difficulty concentrating, remembering, or making decisions?: No Patient able to express need for assistance with ADLs?: Yes Does the patient have difficulty dressing or bathing?: No Independently performs ADLs?: Yes (appropriate for developmental age) Does the patient have difficulty walking or climbing stairs?: Yes Weakness of Legs: Both Weakness of Arms/Hands: Both  Permission Sought/Granted   Permission granted to share information with : Yes, Verbal Permission Granted     Permission granted to share info w AGENCY: [facility]        Emotional Assessment Appearance:: Appears stated age Attitude/Demeanor/Rapport: Engaged Affect (typically observed): Accepting Orientation: : Oriented to Self, Oriented to Place, Oriented to  Time, Oriented to Situation Alcohol / Substance Use: Not Applicable Psych Involvement: No (comment)  Admission diagnosis:  Cellulitis of left upper extremity [L03.114] Swelling of left hand [M79.89] Patient Active Problem List   Diagnosis Date Noted  . Pressure injury of skin 11/03/2018  . Swelling of left hand 11/02/2018  . Osteopenia 08/14/2017  . Genetic testing 01/23/2015  . Family history of breast cancer 01/09/2015  . Encounter for pre-operative cardiovascular clearance 11/23/2014  . Aortic stenosis 11/23/2014  . Normal coronary arteries 11/23/2014  . Morbid obesity ([place]) 11/23/2014  . DJD (degenerative joint disease) 11/23/2014  .  Breast cancer of upper-outer quadrant of left female breast ([place]) 11/13/2014  . Diverticulosis of colon with hemorrhage   . H. pylori infection 05/28/2014  . Abscess of  skin and subcutaneous tissue 12/08/2013  . Iron deficiency anemia 09/19/2013  . Abscess of left axilla 08/11/2013  . Abscess of left thigh 05/26/2013  . Abscess of buttock, right 06/13/2011  . Arteritis ([place]) 02/27/2011  . Abnormal cardiovascular function study 02/15/2011  . Peripheral vascular disease ([place]) 02/15/2011  . Diabetes mellitus ([place]) 02/15/2011  . Dyspnea 01/24/2011  . Hypertension 01/24/2011  . Hyperlipidemia 01/24/2011  . Obesity 01/24/2011  . Bruit 01/24/2011  . Murmur 01/24/2011   PCP:  Bridget Hartshorn, NP Pharmacy:   [id number] - [place], [place] [address] Phone: [phone] Fax: [phone]  [place], [place] [address] Phone: [phone] Fax: [phone]     Social Determinants of Health (SDOH) Interventions    Readmission Risk Interventions No flowsheet data found.

## 2018-11-05 NOTE — Plan of Care (Signed)
  Problem: Pain Managment: Goal: General experience of comfort will improve Outcome: Progressing   Problem: Activity: Goal: Risk for activity intolerance will decrease Outcome: Progressing   

## 2018-11-06 LAB — CBC
HCT: 31.4 % — ABNORMAL LOW (ref 36.0–46.0)
Hemoglobin: 9.6 g/dL — ABNORMAL LOW (ref 12.0–15.0)
MCH: 26.2 pg (ref 26.0–34.0)
MCHC: 30.6 g/dL (ref 30.0–36.0)
MCV: 85.6 fL (ref 80.0–100.0)
Platelets: 313 10*3/uL (ref 150–400)
RBC: 3.67 MIL/uL — ABNORMAL LOW (ref 3.87–5.11)
RDW: 14.8 % (ref 11.5–15.5)
WBC: 9 10*3/uL (ref 4.0–10.5)
nRBC: 0 % (ref 0.0–0.2)

## 2018-11-06 LAB — BASIC METABOLIC PANEL
Anion gap: 11 (ref 5–15)
BUN: 7 mg/dL — ABNORMAL LOW (ref 8–23)
CO2: 24 mmol/L (ref 22–32)
Calcium: 8.4 mg/dL — ABNORMAL LOW (ref 8.9–10.3)
Chloride: 100 mmol/L (ref 98–111)
Creatinine, Ser: 0.58 mg/dL (ref 0.44–1.00)
GFR calc Af Amer: >60 mL/min (ref >60)
GFR calc non Af Amer: >60 mL/min (ref >60)
Glucose, Bld: 109 mg/dL — ABNORMAL HIGH (ref 70–99)
Potassium: 3.8 mmol/L (ref 3.5–5.1)
Sodium: 135 mmol/L (ref 135–145)

## 2018-11-06 LAB — BODY FLUID CULTURE
Culture: NO GROWTH
Special Requests: NORMAL

## 2018-11-06 LAB — GLUCOSE, CAPILLARY
Glucose-Capillary: 95 mg/dL (ref 70–99)
Glucose-Capillary: 90 mg/dL (ref 70–99)

## 2018-11-06 LAB — MAGNESIUM: Magnesium: 2 mg/dL (ref 1.7–2.4)

## 2018-11-06 MED ORDER — OXYCODONE HCL 5 MG PO CAPS: 5.0000 mg | ORAL_CAPSULE | Freq: Four times a day (QID) | ORAL | 0 refills | Status: AC | PRN

## 2018-11-06 MED ORDER — SULFAMETHOXAZOLE-TRIMETHOPRIM 800-160 MG PO TABS: 1.0000 | ORAL_TABLET | Freq: Two times a day (BID) | ORAL | 0 refills | Status: AC

## 2018-11-06 MED ORDER — SENNOSIDES-DOCUSATE SODIUM 8.6-50 MG PO TABS: 2.0000 | ORAL_TABLET | Freq: Every evening | ORAL | 0 refills | Status: DC | PRN

## 2018-11-06 NOTE — TOC Transition Note (Signed)
Transition of Care [facility]) - CM/SW Discharge Note   Patient Details  Name: Kristy Harris MRN: [id number] Date of Birth: September 05, 1945  Transition of Care [facility]) CM/SW Contact:  Claudie Leach, RN Phone Number: 11/06/2018, 9:28 AM   Clinical Narrative:    Home health arranged with [facility].  Per Dr. Reesa Chew, wound care is for sacral wound and RN may continue wound care as family has been providing previously and monitor as needed.  If family is comfortable taking care of wound, RN may be discontinued or just assess periodically.     Final next level of care: [place] Barriers to Discharge: Continued Medical Work up   Patient Goals and CMS Choice Patient states their goals for this hospitalization and ongoing recovery are:: to go home CMS Medicare.gov Compare Post Acute Care list provided to:: Patient Choice offered to / list presented to : Patient   Discharge Plan and Services   Discharge Planning Services: CM Consult Post Acute Care Choice: Home Health          DME Arranged: N/A         HH Arranged: PT, OT   Date HH Agency Contacted: 11/05/18 Time [place]: 1250 Representative spoke with at [place]: Mateo Flow  left voicemail , will need orders

## 2018-11-06 NOTE — Plan of Care (Signed)
  Problem: Pain Managment: Goal: General experience of comfort will improve Outcome: Progressing   Problem: Skin Integrity: Goal: Risk for impaired skin integrity will decrease Outcome: Progressing   

## 2018-11-06 NOTE — Discharge Summary (Signed)
Physician Discharge Summary  JAKEIRA SEEMAN ERX:[id number] DOB: 28-May-1945 DOA: 11/02/2018  PCP: Bridget Hartshorn, NP  Admit date: 11/02/2018 Discharge date: 11/06/2018  Admitted From: Home Disposition: Home with home health  Recommendations for Outpatient Follow-up:  1. Follow up with PCP in 1-2 weeks 2. Please obtain BMP/CBC in one week your next doctors visit.  3. Oxycodone 5 mg every 6 hours as needed for severe pain.  Bowel regimen prescribed 4. Bactrim double strength twice daily for 10 days  Home Health: Arranged by case manager Equipment/Devices: None Discharge Condition: Stable CODE STATUS: Full code Diet recommendation: Diabetic  Brief/Interim Summary: 73 year old female with history of diabetes mellitus type 2, essential hypertension, COPD came to the ER with complaints of left arm pain and swelling for the past 6 days prior to admission.  She was admitted for treatment of severe cellulitis and myositis.  Orthopedic team following.  Received IV vancomycin during the hospitalization which was transitioned to oral Bactrim to complete 14-day course.  Advised to follow-up outpatient with primary care provider in 1 week and get lab work done as well.  Home health arrangements made. Stable for discharge today.    Discharge Diagnoses:  Active Problems:   Swelling of left hand   Pressure injury of skin  Left arm severe cellulitis without pruritus and myositis -MRI shows diffuse myositis and cellulitis without evidence of osteomyelitis.  Seen by orthopedic, recommends conservative management.  IV vancomycin during the hospitalization, will transition to 10 more days of oral Bactrim to complete about 14-day course. -Pain medications and bowel regimen prescribed upon discharge. -Seen by hand surgery - Sling in place, arm elevation  History of COPD -Stable.  Bronchodilators PRN  History of breast cancer -Follows outpatient Dr. Delton Coombes.  Status post lumpectomy/chemo and  radiation.  On anastrozole.  Diabetes mellitus type 2 -Hemoglobin A1c 7.1.  Resume home medications  Essential hypertension -Appears to be stable.  Continue losartan.  Verapamil and metoprolol   Consultations:  Orthopedic  Subjective: Feels better, wishes to go home.  Discharge Exam: Vitals:   11/05/18 2124 11/06/18 0424  BP: (!) 147/61 (!) 144/73  Pulse: 93 91  Resp: 20 15  Temp: 99.2 F (37.3 C) 98.5 F (36.9 C)  SpO2: 94% 93%   Vitals:   11/05/18 0733 11/05/18 1337 11/05/18 2124 11/06/18 0424  BP: 139/83 (!) 141/61 (!) 147/61 (!) 144/73  Pulse: 87 85 93 91  Resp: '18 18 20 15  '$ Temp: 98.2 F (36.8 C) 98.6 F (37 C) 99.2 F (37.3 C) 98.5 F (36.9 C)  TempSrc: Oral Oral Oral Oral  SpO2: 95% 92% 94% 93%  Weight:      Height:        General: Pt is alert, awake, not in acute distress Cardiovascular: RRR, S1/S2 +, no rubs, no gallops Respiratory: CTA bilaterally, no wheezing, no rhonchi Abdominal: Soft, NT, ND, bowel sounds + Extremities: no edema, no cyanosis  Discharge Instructions  Discharge Instructions    Call MD for:  persistant nausea and vomiting   Complete by: As directed    Call MD for:  severe uncontrolled pain   Complete by: As directed    Diet - low sodium heart healthy   Complete by: As directed    Discharge instructions   Complete by: As directed    You were cared for by a hospitalist during your hospital stay. If you have any questions about your discharge medications or the care you received while you were in the  hospital after you are discharged, you can call the unit and asked to speak with the hospitalist on call if the hospitalist that took care of you is not available. Once you are discharged, your primary care physician will handle any further medical issues. Please note that NO REFILLS for any discharge medications will be authorized once you are discharged, as it is imperative that you return to your primary care physician (or  establish a relationship with a primary care physician if you do not have one) for your aftercare needs so that they can reassess your need for medications and monitor your lab values.  Please request your Prim.MD to go over all Hospital Tests and Procedure/Radiological results at the follow up, please get all Hospital records sent to your Prim MD by signing hospital release before you go home.  Get CBC, CMP, 2 view Chest X ray checked  by Primary MD during your next visit or SNF MD in 5-7 days ( we routinely change or add medications that can affect your baseline labs and fluid status, therefore we recommend that you get the mentioned basic workup next visit with your PCP, your PCP may decide not to get them or add new tests based on their clinical decision)  On your next visit with your primary care physician please Get Medicines reviewed and adjusted.  If you experience worsening of your admission symptoms, develop shortness of breath, life threatening emergency, suicidal or homicidal thoughts you must seek medical attention immediately by calling 911 or calling your MD immediately  if symptoms less severe.  You Must read complete instructions/literature along with all the possible adverse reactions/side effects for all the Medicines you take and that have been prescribed to you. Take any new Medicines after you have completely understood and accpet all the possible adverse reactions/side effects.   Do not drive, operate heavy machinery, perform activities at heights, swimming or participation in water activities or provide baby sitting services if your were admitted for syncope or siezures until you have seen by Primary MD or a Neurologist and advised to do so again.  Do not drive when taking Pain medications.   Increase activity slowly   Complete by: As directed      Allergies as of 11/06/2018      Reactions   Lisinopril Swelling, Cough   Mouth swelling   Penicillins Nausea And Vomiting,  Rash   .Did it involve swelling of the face/tongue/throat, SOB, or low BP? Yes Did it involve sudden or severe rash/hives, skin peeling, or any reaction on the inside of your mouth or nose? Yes Did you need to seek medical attention at a hospital or doctor's office? Yes When did it last happen? If all above answers are "NO", may proceed with cephalosporin use.      Medication List    STOP taking these medications   doxycycline 100 MG EC tablet Commonly known as: DORYX     TAKE these medications   acetaminophen 500 MG tablet Commonly known as: TYLENOL Take 1,000 mg by mouth every 6 (six) hours as needed for mild pain or headache.   albuterol 108 (90 Base) MCG/ACT inhaler Commonly known as: VENTOLIN HFA Inhale 2 puffs into the lungs every 4 (four) hours as needed for shortness of breath.   anastrozole 1 MG tablet Commonly known as: ARIMIDEX TAKE 1 TABLET BY MOUTH EVERY DAY   atorvastatin 20 MG tablet Commonly known as: LIPITOR Take 20 mg by mouth every morning.   Blood  Glucose Monitor System w/Device Kit With lancets and strips #100 6rf Dx: E11.9 Test bid   Calcium 150 MG Tabs Take 1 tablet by mouth daily.   cholecalciferol 1000 units tablet Commonly known as: VITAMIN D Take 2,000 Units by mouth daily.   ferrous sulfate 325 (65 FE) MG tablet Take 325 mg by mouth 3 (three) times daily with meals.   fluconazole 150 MG tablet Commonly known as: DIFLUCAN Take 150 mg by mouth as needed.   furosemide 20 MG tablet Commonly known as: LASIX Take 20 mg by mouth 3 (three) times a week.   glipiZIDE 10 MG 24 hr tablet Commonly known as: GLUCOTROL XL Take 10 mg by mouth daily with breakfast.   ibuprofen 200 MG tablet Commonly known as: ADVIL Take 200 mg by mouth every 6 (six) hours as needed.   losartan 100 MG tablet Commonly known as: COZAAR Take 100 mg by mouth daily.   metFORMIN 500 MG 24 hr tablet Commonly known as: GLUCOPHAGE-XR TAKE ONE TABLET BY MOUTH  TWICE DAILY   metoprolol succinate 100 MG 24 hr tablet Commonly known as: TOPROL-XL TAKE ONE (1) TABLET EACH DAY   Misc. Devices Misc Underpad's 6 Bags/15-90 per month  Incontinence Pads 3 Bags/20-60 per month  2 Box of Gloves per month  40 box of gauze  20 rolls of tape  Nutrition supplement/30 day supply   oxycodone 5 MG capsule Commonly known as: OXY-IR Take 1 capsule (5 mg total) by mouth every 6 (six) hours as needed for up to 7 days.   pantoprazole 40 MG tablet Commonly known as: PROTONIX TAKE 1 TABLET (40 MG TOTAL) BY MOUTH DAILY. What changed: See the new instructions.   polyethylene glycol 17 g packet Commonly known as: MIRALAX / GLYCOLAX Take 17 g by mouth daily as needed for mild constipation. What changed: when to take this   senna-docusate 8.6-50 MG tablet Commonly known as: Senokot-S Take 2 tablets by mouth at bedtime as needed for mild constipation or moderate constipation.   sodium chloride irrigation 0.9 % irrigation USE AS DIRECTED   sulfamethoxazole-trimethoprim 800-160 MG tablet Commonly known as: BACTRIM DS Take 1 tablet by mouth 2 (two) times daily for 10 days.   verapamil 120 MG CR tablet Commonly known as: CALAN-SR Take 120 mg by mouth daily.      Follow-up Information    Hemberg, Karie Schwalbe, NP. Schedule an appointment as soon as possible for a visit in 1 week(s).   Specialty: Adult Health Nurse Practitioner Contact information: [place] [address] [phone]          Allergies  Allergen Reactions  . Lisinopril Swelling and Cough    Mouth swelling  . Penicillins Nausea And Vomiting and Rash    .Did it involve swelling of the face/tongue/throat, SOB, or low BP? Yes Did it involve sudden or severe rash/hives, skin peeling, or any reaction on the inside of your mouth or nose? Yes Did you need to seek medical attention at a hospital or doctor's office? Yes When did it last happen? If all above  answers are "NO", may proceed with cephalosporin use.     You were cared for by a hospitalist during your hospital stay. If you have any questions about your discharge medications or the care you received while you were in the hospital after you are discharged, you can call the unit and asked to speak with the hospitalist on call if the hospitalist that took care of you  is not available. Once you are discharged, your primary care physician will handle any further medical issues. Please note that no refills for any discharge medications will be authorized once you are discharged, as it is imperative that you return to your primary care physician (or establish a relationship with a primary care physician if you do not have one) for your aftercare needs so that they can reassess your need for medications and monitor your lab values.   Procedures/Studies: Mr Hand Left Wo Contrast  Result Date: 11/03/2018 CLINICAL DATA:  Soft tissue infection suspected, hand swelling EXAM: MRI OF THE LEFT HAND WITHOUT CONTRAST TECHNIQUE: Multiplanar, multisequence MR imaging of the was performed. No intravenous contrast was administered. COMPARISON:  None. FINDINGS: Bones/Joint/Cartilage There is increased T2 signal seen throughout the carpals. This is most notable in the capitate and the hamate with subtly decreased T1 hypointensity. However there is no areas of cortical destruction or periosteal reaction. No bony erosions. The remainder of the marrow signal appears to be normal in appearance. There is mild joint space loss seen at the first [facility] joint with subchondral cystic change. No large joint effusion is seen. Ligaments The collateral ligaments are intact. The visualized portion of the sagittal bands, pulleys, and volar plates are intact. Muscles and Tendons Normal signal seen throughout the muscles. No muscular edema, tear, or atrophy. The flexor and extensor tendons are normal in signal intensity and morphology. Soft  tissues Diffuse subcutaneous edema is seen throughout the hand with overlying dorsal skin thickening. There is increased feathery signal seen within muscles surrounding the hand. No focal muscular tear is seen. IMPRESSION: 1. Findings of diffuse cellulitis and myositis. 2. Probable reactive marrow seen throughout the carpals without definite evidence of osteomyelitis. Electronically Signed   By: Prudencio Pair M.D.   On: 11/03/2018 08:02      The results of significant diagnostics from this hospitalization (including imaging, microbiology, ancillary and laboratory) are listed below for reference.     Microbiology: Recent Results (from the past 240 hour(s))  Body fluid culture     Status: None (Preliminary result)   Collection Time: 11/02/18  1:04 PM   Specimen: BLOOD LEFT WRIST; Body Fluid  Result Value Ref Range Status   Specimen Description   Final    BLOOD LEFT WRIST Performed at [facility], [address] [place], [place] [address]    Special Requests   Final    Normal Performed at [facility], [address], [place], [place] [address]    Gram Stain   Final    RARE WBC PRESENT, PREDOMINANTLY PMN NO ORGANISMS SEEN    Culture   Final    NO GROWTH 3 DAYS Performed at [facility] [address] [place], [place] [address]    Report Status PENDING  Incomplete  SARS Coronavirus 2 [facility] order, Performed in [facility] hospital lab) Nasopharyngeal Nasopharyngeal Swab     Status: None   Collection Time: 11/02/18  3:48 PM   Specimen: Nasopharyngeal Swab  Result Value Ref Range Status   SARS Coronavirus 2 NEGATIVE NEGATIVE Final    Comment: (NOTE) If result is NEGATIVE SARS-CoV-2 target nucleic acids are NOT DETECTED. The SARS-CoV-2 RNA is generally detectable in upper and lower  respiratory specimens during the acute phase of infection. The lowest  concentration of SARS-CoV-2 viral copies this assay can detect is 250  copies / mL. A negative result does not preclude  SARS-CoV-2 infection  and should not be used as  the sole basis for treatment or other  patient management decisions.  A negative result may occur with  improper specimen collection / handling, submission of specimen other  than nasopharyngeal swab, presence of viral mutation(s) within the  areas targeted by this assay, and inadequate number of viral copies  (<250 copies / mL). A negative result must be combined with clinical  observations, patient history, and epidemiological information. If result is POSITIVE SARS-CoV-2 target nucleic acids are DETECTED. The SARS-CoV-2 RNA is generally detectable in upper and lower  respiratory specimens dur ing the acute phase of infection.  Positive  results are indicative of active infection with SARS-CoV-2.  Clinical  correlation with patient history and other diagnostic information is  necessary to determine patient infection status.  Positive results do  not rule out bacterial infection or co-infection with other viruses. If result is PRESUMPTIVE POSTIVE SARS-CoV-2 nucleic acids MAY BE PRESENT.   A presumptive positive result was obtained on the submitted specimen  and confirmed on repeat testing.  While 2019 novel coronavirus  (SARS-CoV-2) nucleic acids may be present in the submitted sample  additional confirmatory testing may be necessary for epidemiological  and / or clinical management purposes  to differentiate between  SARS-CoV-2 and other Sarbecovirus currently known to infect humans.  If clinically indicated additional testing with an alternate test  methodology [phone]) is advised. The SARS-CoV-2 RNA is generally  detectable in upper and lower respiratory sp ecimens during the acute  phase of infection. The expected result is Negative. Fact Sheet for Patients:  StrictlyIdeas.no Fact Sheet for Healthcare Providers: BankingDealers.co.za This test is not yet approved or cleared by the  [place] FDA and has been authorized for detection and/or diagnosis of SARS-CoV-2 by FDA under an Emergency Use Authorization (EUA).  This EUA will remain in effect (meaning this test can be used) for the duration of the COVID-19 declaration under Section 564(b)(1) of the Act, 21 U.S.C. section 360bbb-3(b)(1), unless the authorization is terminated or revoked sooner. Performed at [facility], [address], [place], [place] [address]      Labs: BNP (last 3 results) No results for input(s): BNP in the last 8760 hours. Basic Metabolic Panel: Recent Labs  Lab 11/02/18 1304 11/04/18 0252 11/05/18 0435 11/06/18 0508  NA 137 135 136 135  K 3.5 3.6 3.6 3.8  CL 101 98 101 100  CO2 '27 27 26 24  '$ GLUCOSE 156* 110* 105* 109*  BUN 12 7* 8 7*  CREATININE 0.63 0.62 0.59 0.58  CALCIUM 8.5* 7.9* 8.2* 8.4*  MG  --   --  2.1 2.0   Liver Function Tests: Recent Labs  Lab 11/02/18 1304  AST 15  ALT 15  ALKPHOS 84  BILITOT 0.3  PROT 7.9  ALBUMIN 3.3*   No results for input(s): LIPASE, AMYLASE in the last 168 hours. No results for input(s): AMMONIA in the last 168 hours. CBC: Recent Labs  Lab 11/02/18 1304 11/03/18 0041 11/04/18 0252 11/05/18 0435 11/06/18 0508  WBC 8.1 8.5 7.9 8.1 9.0  NEUTROABS 5.7  --   --   --   --   HGB 10.7* 10.4* 9.8* 9.9* 9.6*  HCT 35.6* 34.4* 32.5* 32.5* 31.4*  MCV 87.3 87.3 86.0 85.5 85.6  PLT 290 309 291 291 313   Cardiac Enzymes: No results for input(s): CKTOTAL, CKMB, CKMBINDEX, TROPONINI in the last 168 hours. BNP: Invalid input(s): POCBNP CBG: Recent Labs  Lab 11/05/18 1206 11/05/18 1610 11/05/18 2127 11/06/18 0647  GLUCAP  93 136* 130* 95   D-Dimer No results for input(s): DDIMER in the last 72 hours. Hgb A1c No results for input(s): HGBA1C in the last 72 hours. Lipid Profile No results for input(s): CHOL, HDL, LDLCALC, TRIG, CHOLHDL, LDLDIRECT in the last 72 hours. Thyroid function studies No results for input(s): TSH,  T4TOTAL, T3FREE, THYROIDAB in the last 72 hours.  Invalid input(s): FREET3 Anemia work up No results for input(s): VITAMINB12, FOLATE, FERRITIN, TIBC, IRON, RETICCTPCT in the last 72 hours. Urinalysis    Component Value Date/Time   COLORURINE YELLOW 05/28/2014 1140   APPEARANCEUR CLOUDY (A) 05/28/2014 1140   LABSPEC 1.021 05/28/2014 1140   PHURINE 6.0 05/28/2014 1140   GLUCOSEU NEGATIVE 05/28/2014 1140   HGBUR TRACE (A) 05/28/2014 1140   BILIRUBINUR NEGATIVE 05/28/2014 1140   KETONESUR NEGATIVE 05/28/2014 1140   PROTEINUR NEGATIVE 05/28/2014 1140   UROBILINOGEN 0.2 05/28/2014 1140   NITRITE NEGATIVE 05/28/2014 1140   LEUKOCYTESUR MODERATE (A) 05/28/2014 1140   Sepsis Labs Invalid input(s): PROCALCITONIN,  WBC,  LACTICIDVEN Microbiology Recent Results (from the past 240 hour(s))  Body fluid culture     Status: None (Preliminary result)   Collection Time: 11/02/18  1:04 PM   Specimen: BLOOD LEFT WRIST; Body Fluid  Result Value Ref Range Status   Specimen Description   Final    BLOOD LEFT WRIST Performed at [facility], [address], [place], [place] [address]    Special Requests   Final    Normal Performed at [facility], [address], [place], [place] [address]    Gram Stain   Final    RARE WBC PRESENT, PREDOMINANTLY PMN NO ORGANISMS SEEN    Culture   Final    NO GROWTH 3 DAYS Performed at [facility] [address] [place], [place] [address]    Report Status PENDING  Incomplete  SARS Coronavirus 2 [facility] order, Performed in [facility] hospital lab) Nasopharyngeal Nasopharyngeal Swab     Status: None   Collection Time: 11/02/18  3:48 PM   Specimen: Nasopharyngeal Swab  Result Value Ref Range Status   SARS Coronavirus 2 NEGATIVE NEGATIVE Final    Comment: (NOTE) If result is NEGATIVE SARS-CoV-2 target nucleic acids are NOT DETECTED. The SARS-CoV-2 RNA is generally detectable in upper and lower  respiratory specimens during the acute phase of  infection. The lowest  concentration of SARS-CoV-2 viral copies this assay can detect is 250  copies / mL. A negative result does not preclude SARS-CoV-2 infection  and should not be used as the sole basis for treatment or other  patient management decisions.  A negative result may occur with  improper specimen collection / handling, submission of specimen other  than nasopharyngeal swab, presence of viral mutation(s) within the  areas targeted by this assay, and inadequate number of viral copies  (<250 copies / mL). A negative result must be combined with clinical  observations, patient history, and epidemiological information. If result is POSITIVE SARS-CoV-2 target nucleic acids are DETECTED. The SARS-CoV-2 RNA is generally detectable in upper and lower  respiratory specimens dur ing the acute phase of infection.  Positive  results are indicative of active infection with SARS-CoV-2.  Clinical  correlation with patient history and other diagnostic information is  necessary to determine patient infection status.  Positive results do  not rule out bacterial infection or co-infection with other viruses. If result is PRESUMPTIVE POSTIVE SARS-CoV-2 nucleic acids MAY BE PRESENT.   A presumptive positive result  was obtained on the submitted specimen  and confirmed on repeat testing.  While 2019 novel coronavirus  (SARS-CoV-2) nucleic acids may be present in the submitted sample  additional confirmatory testing may be necessary for epidemiological  and / or clinical management purposes  to differentiate between  SARS-CoV-2 and other Sarbecovirus currently known to infect humans.  If clinically indicated additional testing with an alternate test  methodology [phone]) is advised. The SARS-CoV-2 RNA is generally  detectable in upper and lower respiratory sp ecimens during the acute  phase of infection. The expected result is Negative. Fact Sheet for Patients:   StrictlyIdeas.no Fact Sheet for Healthcare Providers: BankingDealers.co.za This test is not yet approved or cleared by the [place] FDA and has been authorized for detection and/or diagnosis of SARS-CoV-2 by FDA under an Emergency Use Authorization (EUA).  This EUA will remain in effect (meaning this test can be used) for the duration of the COVID-19 declaration under Section 564(b)(1) of the Act, 21 U.S.C. section 360bbb-3(b)(1), unless the authorization is terminated or revoked sooner. Performed at [facility], [address], [place], [place] [address]      Time coordinating discharge:  I have spent 35 minutes face to face with the patient and on the ward discussing the patients care, assessment, plan and disposition with other care givers. >50% of the time was devoted counseling the patient about the risks and benefits of treatment/Discharge disposition and coordinating care.   SIGNED:   Damita Lack, MD  [facility] 11/06/2018, 9:10 AM   If 7PM-7AM, please contact night-coverage [email or web address]

## 2018-11-16 ENCOUNTER — Other Ambulatory Visit (HOSPITAL_COMMUNITY): Payer: Self-pay | Admitting: Hematology

## 2018-11-16 DIAGNOSIS — C50412 Malignant neoplasm of upper-outer quadrant of left female breast: Secondary | ICD-10-CM

## 2018-11-18 ENCOUNTER — Other Ambulatory Visit (HOSPITAL_COMMUNITY): Payer: Self-pay | Admitting: Hematology

## 2018-11-18 DIAGNOSIS — R928 Other abnormal and inconclusive findings on diagnostic imaging of breast: Secondary | ICD-10-CM

## 2018-12-07 ENCOUNTER — Ambulatory Visit (HOSPITAL_COMMUNITY): Admission: RE | Admit: 2018-12-07 | Payer: Medicare Other | Source: Ambulatory Visit

## 2018-12-07 ENCOUNTER — Other Ambulatory Visit: Payer: Self-pay

## 2018-12-07 ENCOUNTER — Ambulatory Visit (HOSPITAL_COMMUNITY)
Admission: RE | Admit: 2018-12-07 | Discharge: 2018-12-07 | Disposition: A | Discharge status: Home / Self Care | Payer: Medicare Other | Source: Ambulatory Visit | Attending: Hematology | Admitting: Hematology

## 2018-12-07 DIAGNOSIS — Z853 Personal history of malignant neoplasm of breast: Secondary | ICD-10-CM | POA: Diagnosis not present

## 2018-12-07 DIAGNOSIS — C50919 Malignant neoplasm of unspecified site of unspecified female breast: Secondary | ICD-10-CM

## 2019-01-21 ENCOUNTER — Other Ambulatory Visit (HOSPITAL_COMMUNITY): Payer: Self-pay | Admitting: *Deleted

## 2019-01-21 DIAGNOSIS — C50919 Malignant neoplasm of unspecified site of unspecified female breast: Secondary | ICD-10-CM

## 2019-01-21 DIAGNOSIS — M85852 Other specified disorders of bone density and structure, left thigh: Secondary | ICD-10-CM

## 2019-01-24 ENCOUNTER — Inpatient Hospital Stay (HOSPITAL_COMMUNITY): Payer: Medicare Other

## 2019-01-24 ENCOUNTER — Other Ambulatory Visit: Payer: Self-pay

## 2019-01-24 ENCOUNTER — Inpatient Hospital Stay (HOSPITAL_COMMUNITY): Payer: Medicare Other | Attending: Hematology | Admitting: Nurse Practitioner

## 2019-01-24 VITALS — BP 136/50 | HR 81 | Temp 97.1°F | Resp 16 | Wt 283.0 lb

## 2019-01-24 DIAGNOSIS — T50905A Adverse effect of unspecified drugs, medicaments and biological substances, initial encounter: Secondary | ICD-10-CM | POA: Diagnosis not present

## 2019-01-24 DIAGNOSIS — R5383 Other fatigue: Secondary | ICD-10-CM | POA: Insufficient documentation

## 2019-01-24 DIAGNOSIS — N951 Menopausal and female climacteric states: Secondary | ICD-10-CM | POA: Insufficient documentation

## 2019-01-24 DIAGNOSIS — C50919 Malignant neoplasm of unspecified site of unspecified female breast: Secondary | ICD-10-CM

## 2019-01-24 DIAGNOSIS — Z170 Estrogen receptor positive status [ER+]: Secondary | ICD-10-CM

## 2019-01-24 DIAGNOSIS — D509 Iron deficiency anemia, unspecified: Secondary | ICD-10-CM | POA: Diagnosis not present

## 2019-01-24 DIAGNOSIS — C50412 Malignant neoplasm of upper-outer quadrant of left female breast: Secondary | ICD-10-CM | POA: Diagnosis present

## 2019-01-24 DIAGNOSIS — M8580 Other specified disorders of bone density and structure, unspecified site: Secondary | ICD-10-CM | POA: Insufficient documentation

## 2019-01-24 DIAGNOSIS — R232 Flushing: Secondary | ICD-10-CM | POA: Diagnosis not present

## 2019-01-24 DIAGNOSIS — R0609 Other forms of dyspnea: Secondary | ICD-10-CM | POA: Diagnosis not present

## 2019-01-24 LAB — CBC WITH DIFFERENTIAL/PLATELET
Abs Immature Granulocytes: 0.06 10*3/uL (ref 0.00–0.07)
Basophils Absolute: 0 10*3/uL (ref 0.0–0.1)
Basophils Relative: 0 %
Eosinophils Absolute: 0.2 10*3/uL (ref 0.0–0.5)
Eosinophils Relative: 3 %
HCT: 34.4 % — ABNORMAL LOW (ref 36.0–46.0)
Hemoglobin: 10.2 g/dL — ABNORMAL LOW (ref 12.0–15.0)
Immature Granulocytes: 1 %
Lymphocytes Relative: 23 %
Lymphs Abs: 2 10*3/uL (ref 0.7–4.0)
MCH: 26.2 pg (ref 26.0–34.0)
MCHC: 29.7 g/dL — ABNORMAL LOW (ref 30.0–36.0)
MCV: 88.4 fL (ref 80.0–100.0)
Monocytes Absolute: 0.5 10*3/uL (ref 0.1–1.0)
Monocytes Relative: 6 %
Neutro Abs: 5.7 10*3/uL (ref 1.7–7.7)
Neutrophils Relative %: 67 %
Platelets: 295 10*3/uL (ref 150–400)
RBC: 3.89 MIL/uL (ref 3.87–5.11)
RDW: 17.6 % — ABNORMAL HIGH (ref 11.5–15.5)
WBC: 8.5 10*3/uL (ref 4.0–10.5)
nRBC: 0 % (ref 0.0–0.2)

## 2019-01-24 LAB — COMPREHENSIVE METABOLIC PANEL
ALT: 15 U/L (ref 0–44)
AST: 18 U/L (ref 15–41)
Albumin: 3.6 g/dL (ref 3.5–5.0)
Alkaline Phosphatase: 75 U/L (ref 38–126)
Anion gap: 11 (ref 5–15)
BUN: 14 mg/dL (ref 8–23)
CO2: 25 mmol/L (ref 22–32)
Calcium: 9.1 mg/dL (ref 8.9–10.3)
Chloride: 104 mmol/L (ref 98–111)
Creatinine, Ser: 0.89 mg/dL (ref 0.44–1.00)
GFR calc Af Amer: >60 mL/min (ref >60)
GFR calc non Af Amer: >60 mL/min (ref >60)
Glucose, Bld: 116 mg/dL — ABNORMAL HIGH (ref 70–99)
Potassium: 4 mmol/L (ref 3.5–5.1)
Sodium: 140 mmol/L (ref 135–145)
Total Bilirubin: 0.5 mg/dL (ref 0.3–1.2)
Total Protein: 7.9 g/dL (ref 6.5–8.1)

## 2019-01-24 LAB — IRON AND TIBC
Iron: 37 ug/dL (ref 28–170)
Saturation Ratios: 13 % (ref 10.4–31.8)
TIBC: 288 ug/dL (ref 250–450)
UIBC: 251 ug/dL

## 2019-01-24 LAB — FERRITIN: Ferritin: 224 ng/mL (ref 11–307)

## 2019-01-24 LAB — VITAMIN B12: Vitamin B-12: 542 pg/mL (ref 180–914)

## 2019-01-24 LAB — FOLATE: Folate: 9.8 ng/mL (ref >5.9)

## 2019-01-24 MED ORDER — BLACK COHOSH 40 MG PO CAPS: 40.0000 mg | ORAL_CAPSULE | Freq: Every morning | ORAL | 3 refills | Status: DC

## 2019-01-24 MED ORDER — ANASTROZOLE 1 MG PO TABS: 1.0000 mg | ORAL_TABLET | Freq: Every day | ORAL | 3 refills | Status: DC

## 2019-01-24 NOTE — Assessment & Plan Note (Addendum)
1.  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 -Status post left lumpectomy on 12/25/2014 followed by adjuvant chemotherapy consisting of Abraxane/Herceptin from 02/01/2015-04/19/2015. -He underwent XRT by Dr. Lisbeth Renshaw from 05/08/2015-06/22/2015 with continuation of Herceptin x52 weeks finishing on 02/07/2016. -Anastrozole was started May 2017.  She is tolerating it very well.  She does have occasional hot flashes that are minor and stable. -Today's physical examination did not reveal any palpable masses in bilateral breasts.  Left breast upper outer quadrant lumpectomy scar is stable.  No palpable adenopathy. -Last mammogram was on 12/07/2018 showed B RADS category 2 benign. -Labs done on 01/24/2019 showed potassium 4.0 creatinine 0.89, WBC 8.5, hemoglobin 10.2, platelets 295. -We will see her back in 6 months with repeat labs.  2.  Osteopenia: -Last DEXA scan on 06/12/2017 showed T score of -2.3 in the femoral neck. -Prolia was started on 09/18/2017.  She is tolerating well. -She will continue to take calcium and vitamin D daily.  3.  Normocytic anemia: -Labs on 01/24/2019 showed hemoglobin 10.2, ferritin 224, percent saturation 13 -She is currently taking iron 3 tablets a day.  She is tolerating it well.  However she forgets to take the medication sometimes. -Her last dose of IV iron was in 2017. -She reports her fatigue during the day is pretty bad. -We will set her up for 2 infusions of IV iron to see if that helps her fatigue. -We will repeat labs on her next visit.

## 2019-01-24 NOTE — Progress Notes (Signed)
[place], [place] [address]   CLINIC:  Medical Oncology/Hematology  PCP:  Bridget Hartshorn, NP [facility] [address] [place] [id number] [phone]   REASON FOR VISIT: Follow-up for breast cancer  CURRENT THERAPY: Arimidex  BRIEF ONCOLOGIC HISTORY:  Oncology History  Breast cancer of upper-outer quadrant of left female breast ([place])  11/01/2014 Mammogram   Possible mass in the left breast upper outer quadrant measuring 13 mm suspicious for breast cancer confirmed through ultrasound a spiculated hypoechoic mass ill-defined, no enlarged lymph nodes   11/01/2014 Initial Diagnosis   Invasive ductal carcinoma, moderately differentiated, 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 The estimated ejection fraction was in the range of 55% to 60%.    12/25/2014 Surgery   Left lumpectomy: IDC 1.7 cm, positive for LVI, with DCIS, 1/1 sentinel node positive deposit 1.9 cm with extracapsular extension, ER 90%, PR 90%, HER-2 positive ratio 2.4, Ki-67 29% T1 cN1 stage II a   02/01/2015 - 04/19/2015 Chemotherapy   Abraxane/Herceptin   03/20/2015 Echocardiogram   Systolic function wasnormal. The estimated ejection fraction was in the range of 60%to 65%.   05/08/2015 - 06/22/2015 Radiation Therapy   Dr. Lisbeth Renshaw    05/10/2015 - 02/07/2016 Antibody Plan   Herceptin every 21 days to complete 52 weeks worth of therapy.   06/13/2015 Echocardiogram   2D echo- The estimated ejection fraction was in the range of 60% to 65%. Diastolic function is abnormal, indeterminate grade. Wallmotion was normal.   06/28/2015 Imaging   Bone density- BMD as determined from Femur Total Left is 0.994 g/cm2 with a T-Score of -0.1. This patient is considered normal according to [facility]) criteria.    07/13/2015 -  Anti-estrogen oral therapy    Arimidex daily   09/12/2015 Echocardiogram   The estimated ejection fraction was in the range of 60% to 65%. Wall motion was normal; there were no regional wall motion abnormalities. Doppler parameters are consistent with abnormal L ventricular relaxation (grade 1 diastolic dysfunction).   12/04/2015 Echocardiogram   Left ventricle: The cavity size was normal. Wall thickness was   increased increased in a pattern of mild to moderate LVH.   Systolic function was normal. The estimated ejection fraction was   in the range of 60% to 65%. Wall motion was normal; there were no   regional wall motion abnormalities. Features are consistent with   a pseudonormal left ventricular filling pattern, with concomitant   abnormal relaxation and increased filling pressure (grade 2   diastolic dysfunction). Doppler parameters are consistent with   high ventricular filling pressure.   03/21/2016 Procedure   Port removed by Dr. Lucia Gaskins      CANCER STAGING: Cancer Staging Breast cancer of upper-outer quadrant of left female breast [facility]) Staging form: Breast, AJCC 7th Edition - Clinical stage from 02/08/2015: Stage IIA (T1c, N1, M0) - Signed by Baird Cancer, PA-C on 02/08/2015    INTERVAL HISTORY:  Ms. Wescott 73 y.o. female returns for routine follow-up for breast cancer and iron deficiency anemia.  Patient reports she is very fatigued throughout the day.  She denies any easy bruising or bleeding.  She is taking her Arimidex as prescribed.  She does have occasional hot flashes and she is going to try black cohosh for them. Denies any nausea, vomiting,  or diarrhea. Denies any new pains. Had not noticed any recent bleeding such as epistaxis, hematuria or hematochezia. Denies recent chest pain on exertion, shortness of breath on minimal exertion, pre-syncopal episodes, or palpitations. Denies any numbness or tingling in hands or feet. Denies any recent fevers, infections, or recent hospitalizations. Patient  reports appetite at 100% and energy level at 0%.  She is eating well maintain her weight this time.    REVIEW OF SYSTEMS:  Review of Systems  Constitutional: Positive for fatigue.  Respiratory: Positive for shortness of breath (With exertion).   Cardiovascular: Positive for leg swelling.  All other systems reviewed and are negative.    PAST MEDICAL/SURGICAL HISTORY:  Past Medical History:  Diagnosis Date  . Anemia    takes iron supplement  . Arthritis    knees  . COPD (chronic obstructive pulmonary disease) ([place])   . Enlarged heart   . GERD (gastroesophageal reflux disease)   . Hidradenitis suppurativa    buttocks  . History of breast cancer 12/2014  . Hyperlipidemia   . Hypertension    states under control with meds., has been on med. > 40 yrs.  . Morbid obesity ([place])   . Non-insulin dependent type 2 diabetes mellitus ([place])   . Shortness of breath dyspnea    with exertion  . Urinary frequency    Past Surgical History:  Procedure Laterality Date  . ABDOMINAL HYSTERECTOMY  25 yrs ago   partial  . BLADDER SUSPENSION     x 2  . BREAST LUMPECTOMY WITH RADIOACTIVE SEED AND SENTINEL LYMPH NODE BIOPSY Left 12/25/2014   Procedure: RADIOACTIVE SEED GIUDED LEFT BREAST LUMPECTOMY, LEFT AXILLARY SENTINEL LYMPH NODE BIOPSY;  Surgeon: Alphonsa Overall, MD;  Location: [facility];  Service: General;  Laterality: Left;  . CARDIAC CATHETERIZATION  02/21/2011   Normal coronary arteries, normal EF  . COLONOSCOPY WITH PROPOFOL  11/18/2013  . ESOPHAGOGASTRODUODENOSCOPY (EGD) WITH PROPOFOL  11/18/2013  . EXCISION HYDRADENITIS LABIA N/A 12/08/2013   Procedure: EXCISION HIDRADENITIS PUBIC AREA;  Surgeon: Alphonsa Overall, MD;  Location: [facility];  Service: General;  Laterality: N/A;  . HYDRADENITIS EXCISION Left 12/08/2013   Procedure: EXCISION HIDRADENITIS AXILLA;  Surgeon: Alphonsa Overall, MD;  Location: [facility];  Service: General;  Laterality: Left;  . INCISION AND DRAINAGE ABSCESS  08/17/2008   perineum  and buttock  . IRRIGATION AND DEBRIDEMENT ABSCESS Right 12/23/2012   Procedure: incision  AND DEBRIDEMENT right buttock infection ;  Surgeon: Shann Medal, MD;  Location: [facility];  Service: General;  Laterality: Right;  . PILONIDAL CYST / SINUS EXCISION  06/04/2004  . PILONIDAL CYST EXCISION    . PORT-A-CATH REMOVAL Right 03/21/2016   Procedure: MINOR REMOVAL PORT-A-CATH;  Surgeon: Alphonsa Overall, MD;  Location: [facility];  Service: General;  Laterality: Right;  MINOR REMOVAL PORT-A-CATH  . PORTACATH PLACEMENT N/A 01/12/2015   Procedure: INSERTION PORT-A-CATH;  Surgeon: Alphonsa Overall, MD;  Location[facility];  Service: General;  Laterality: N/A;  . TONSILLECTOMY       SOCIAL HISTORY:  Social History   Socioeconomic History  . Marital status: Widowed    Spouse name: Not on file  . Number of children: 5  . Years of education: Not on file  . Highest education level: Not on file  Occupational History  . Occupation: Textile work    Comment: Disabled  Social Needs  . Financial resource strain: Not on file  . Food insecurity    Worry: Not on file  Inability: Not on file  . Transportation needs    Medical: Not on file    Non-medical: Not on file  Tobacco Use  . Smoking status: Former Smoker    Years: 0.00    Quit date: 03/03/1994    Years since quitting: 24.9  . Smokeless tobacco: Never Used  Substance and Sexual Activity  . Alcohol use: No  . Drug use: No  . Sexual activity: Not on file  Lifestyle  . Physical activity    Days per week: Not on file    Minutes per session: Not on file  . Stress: Not on file  Relationships  . Social Herbalist on phone: Not on file    Gets together: Not on file    Attends religious service: Not on file    Active member of club or organization: Not on file    Attends meetings of clubs or organizations: Not on file    Relationship status: Not on file  . Intimate partner violence    Fear of current or ex partner: Not  on file    Emotionally abused: Not on file    Physically abused: Not on file    Forced sexual activity: Not on file  Other Topics Concern  . Not on file  Social History Narrative   Lives with grandson.  Widowed.  Has 2 sons and 3 daughters    FAMILY HISTORY:  Family History  Problem Relation Age of Onset  . Heart attack Mother        had multiple health problems  . Lung cancer Father 41       smoker  . Prostate cancer Brother   . Lung cancer Brother        dx. 68s; smoker  . Throat cancer Brother        dx. 62s; smoker  . Diabetes Sister        has 3 living sisters with multiple health problems  . Hypertension Son   . Hypertension Daughter   . Breast cancer Sister 40  . Breast cancer Sister        dx. 27s  . Breast cancer Maternal Grandmother        dx. 34s  . Breast cancer Maternal Aunt        dx. older than 24  . Dementia Maternal Aunt   . Breast cancer Cousin        dx. 46s  . Hypertension Brother     CURRENT MEDICATIONS:  Outpatient Encounter Medications as of 01/24/2019  Medication Sig Note  . acetaminophen (TYLENOL) 500 MG tablet Take 1,000 mg by mouth every 6 (six) hours as needed for mild pain or headache.   . anastrozole (ARIMIDEX) 1 MG tablet TAKE 1 TABLET BY MOUTH EVERY DAY   . atorvastatin (LIPITOR) 20 MG tablet Take 20 mg by mouth every morning.    . Blood Glucose Monitoring Suppl (BLOOD GLUCOSE MONITOR SYSTEM) w/Device KIT With lancets and strips #100 6rf Dx: E11.9 Test bid   . Calcium 150 MG TABS Take 1 tablet by mouth daily.    . cholecalciferol (VITAMIN D) 1000 units tablet Take 2,000 Units by mouth daily.   . ferrous sulfate 325 (65 FE) MG tablet Take 325 mg by mouth 3 (three) times daily with meals.   . furosemide (LASIX) 20 MG tablet Take 20 mg by mouth 3 (three) times a week.   Marland Kitchen glipiZIDE (GLUCOTROL XL) 10 MG 24 hr tablet Take 10  mg by mouth daily with breakfast.    . losartan (COZAAR) 100 MG tablet Take 100 mg by mouth daily.   . metFORMIN  (GLUCOPHAGE-XR) 500 MG 24 hr tablet TAKE ONE TABLET BY MOUTH TWICE DAILY   . metoprolol succinate (TOPROL-XL) 100 MG 24 hr tablet TAKE ONE (1) TABLET EACH DAY 11/15/2015: Received from: [place] [facility]  . Misc. Devices MISC Underpad's 6 Bags/15-90 per month  Incontinence Pads 3 Bags/20-60 per month  2 Box of Gloves per month  40 box of gauze  20 rolls of tape  Nutrition supplement/30 day supply   . pantoprazole (PROTONIX) 40 MG tablet TAKE 1 TABLET (40 MG TOTAL) BY MOUTH DAILY. (Patient taking differently: Take 40 mg by mouth as needed. )   . sodium chloride irrigation 0.9 % irrigation USE AS DIRECTED   . verapamil (CALAN-SR) 120 MG CR tablet Take 120 mg by mouth daily.   Marland Kitchen albuterol (PROVENTIL HFA;VENTOLIN HFA) 108 (90 BASE) MCG/ACT inhaler Inhale 2 puffs into the lungs every 4 (four) hours as needed for shortness of breath.   . fluconazole (DIFLUCAN) 150 MG tablet Take 150 mg by mouth as needed.    . polyethylene glycol (MIRALAX / GLYCOLAX) packet Take 17 g by mouth daily as needed for mild constipation. (Patient not taking: Reported on 01/24/2019)   . [DISCONTINUED] ibuprofen (ADVIL) 200 MG tablet Take 200 mg by mouth every 6 (six) hours as needed.   . [DISCONTINUED] senna-docusate (SENOKOT-S) 8.6-50 MG tablet Take 2 tablets by mouth at bedtime as needed for mild constipation or moderate constipation.    No facility-administered encounter medications on file as of 01/24/2019.     ALLERGIES:  Allergies  Allergen Reactions  . Lisinopril Swelling and Cough    Mouth swelling  . Penicillins Nausea And Vomiting and Rash    .Did it involve swelling of the face/tongue/throat, SOB, or low BP? Yes Did it involve sudden or severe rash/hives, skin peeling, or any reaction on the inside of your mouth or nose? Yes Did you need to seek medical attention at a hospital or doctor's office? Yes When did it last happen? If all above answers are "NO", may proceed with cephalosporin use.       PHYSICAL EXAM:  ECOG Performance status: 1  Vitals:   01/24/19 1419  BP: (!) 136/50  Pulse: 81  Resp: 16  Temp: (!) 97.1 F (36.2 C)  SpO2: 99%   Filed Weights   01/24/19 1419  Weight: 283 lb (128.4 kg)    Physical Exam Constitutional:      Appearance: She is obese.  Cardiovascular:     Rate and Rhythm: Normal rate and regular rhythm.     Heart sounds: Normal heart sounds.  Pulmonary:     Effort: Pulmonary effort is normal.     Breath sounds: Normal breath sounds.  Abdominal:     General: Bowel sounds are normal.     Palpations: Abdomen is soft.  Musculoskeletal: Normal range of motion.  Skin:    General: Skin is warm.  Neurological:     Mental Status: She is alert and oriented to person, place, and time. Mental status is at baseline.  Psychiatric:        Mood and Affect: Mood normal.        Behavior: Behavior normal.        Thought Content: Thought content normal.        Judgment: Judgment normal.      LABORATORY DATA:  I have  reviewed the labs as listed.  CBC    Component Value Date/Time   WBC 8.5 01/24/2019 1327   RBC 3.89 01/24/2019 1327   HGB 10.2 (L) 01/24/2019 1327   HCT 34.4 (L) 01/24/2019 1327   PLT 295 01/24/2019 1327   MCV 88.4 01/24/2019 1327   MCH 26.2 01/24/2019 1327   MCHC 29.7 (L) 01/24/2019 1327   RDW 17.6 (H) 01/24/2019 1327   LYMPHSABS 2.0 01/24/2019 1327   MONOABS 0.5 01/24/2019 1327   EOSABS 0.2 01/24/2019 1327   BASOSABS 0.0 01/24/2019 1327   CMP Latest Ref Rng & Units 01/24/2019 11/06/2018 11/05/2018  Glucose 70 - 99 mg/dL 116(H) 109(H) 105(H)  BUN 8 - 23 mg/dL 14 7(L) 8  Creatinine 0.44 - 1.00 mg/dL 0.89 0.58 0.59  Sodium 135 - 145 mmol/L 140 135 136  Potassium 3.5 - 5.1 mmol/L 4.0 3.8 3.6  Chloride 98 - 111 mmol/L 104 100 101  CO2 22 - 32 mmol/L '25 24 26  '$ Calcium 8.9 - 10.3 mg/dL 9.1 8.4(L) 8.2(L)  Total Protein 6.5 - 8.1 g/dL 7.9 - -  Total Bilirubin 0.3 - 1.2 mg/dL 0.5 - -  Alkaline Phos 38 - 126 U/L 75 - -  AST 15 - 41  U/L 18 - -  ALT 0 - 44 U/L 15 - -       DIAGNOSTIC IMAGING:  I have independently reviewed the mammogram scans and discussed with the patient.  I personally performed a face-to-face visit.  All questions were answered to patient's stated satisfaction. Encouraged patient to call with any new concerns or questions before his next visit to the cancer center and we can certain see him sooner, if needed.     ASSESSMENT & PLAN:   Breast cancer of upper-outer quadrant of left female breast ([place]) 1.  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 -Status post left lumpectomy on 12/25/2014 followed by adjuvant chemotherapy consisting of Abraxane/Herceptin from 02/01/2015-04/19/2015. -He underwent XRT by Dr. Lisbeth Renshaw from 05/08/2015-06/22/2015 with continuation of Herceptin x52 weeks finishing on 02/07/2016. -Anastrozole was started May 2017.  She is tolerating it very well.  She does have occasional hot flashes that are minor and stable. -Today's physical examination did not reveal any palpable masses in bilateral breasts.  Left breast upper outer quadrant lumpectomy scar is stable.  No palpable adenopathy. -Last mammogram was on 12/07/2018 showed B RADS category 2 benign. -Labs done on 01/24/2019 showed potassium 4.0 creatinine 0.89, WBC 8.5, hemoglobin 10.2, platelets 295. -We will see her back in 6 months with repeat labs.  2.  Osteopenia: -Last DEXA scan on 06/12/2017 showed T score of -2.3 in the femoral neck. -Prolia was started on 09/18/2017.  She is tolerating well. -She will continue to take calcium and vitamin D daily.  3.  Normocytic anemia: -Labs on 01/24/2019 showed hemoglobin 10.2, ferritin 224, percent saturation 13 -She is currently taking iron 3 tablets a day.  She is tolerating it well.  However she forgets to take the medication sometimes. -Her last dose of IV iron was in 2017. -She reports her fatigue  during the day is pretty bad. -We will set her up for 2 infusions of IV iron to see if that helps her fatigue. -We will repeat labs on her next visit.      Orders placed this encounter:  Orders Placed This Encounter  Procedures  . Lactate dehydrogenase  . CBC with Differential/Platelet  . Comprehensive  metabolic panel  . Ferritin  . Iron and TIBC  . Vitamin B12  . Vitamin D 25 hydroxy     Wenda Low, FNP-C [place] [phone]

## 2019-01-24 NOTE — Patient Instructions (Signed)
[facility] at [facility] Discharge Instructions  Follow up in 6 months with labs    Thank you for choosing [facility] at [facility] to provide your oncology and hematology care.  To afford each patient quality time with our provider, please arrive at least 15 minutes before your scheduled appointment time.   If you have a lab appointment with the Cancer Center please come in thru the Main Entrance and check in at the main information desk.  You need to re-schedule your appointment should you arrive 10 or more minutes late.  We strive to give you quality time with our providers, and arriving late affects you and other patients whose appointments are after yours.  Also, if you no show three or more times for appointments you may be dismissed from the clinic at the providers discretion.     Again, thank you for choosing [facility].  Our hope is that these requests will decrease the amount of time that you wait before being seen by our physicians.       _____________________________________________________________  Should you have questions after your visit to [facility], please contact our office at [phone] between the hours of 8:00 a.m. and 4:30 p.m.  Voicemails left after 4:00 p.m. will not be returned until the following business day.  For prescription refill requests, have your pharmacy contact our office and allow 72 hours.    Due to Covid, you will need to wear a mask upon entering the hospital. If you do not have a mask, a mask will be given to you at the Main Entrance upon arrival. For doctor visits, patients may have 1 support person with them. For treatment visits, patients can not have anyone with them due to social distancing guidelines and our immunocompromised population.      

## 2019-02-07 ENCOUNTER — Inpatient Hospital Stay (HOSPITAL_COMMUNITY): Payer: Medicare Other | Attending: Hematology

## 2019-02-07 ENCOUNTER — Other Ambulatory Visit: Payer: Self-pay

## 2019-02-07 VITALS — BP 146/58 | HR 83 | Temp 96.9°F | Resp 16

## 2019-02-07 DIAGNOSIS — D509 Iron deficiency anemia, unspecified: Secondary | ICD-10-CM | POA: Insufficient documentation

## 2019-02-07 DIAGNOSIS — D508 Other iron deficiency anemias: Secondary | ICD-10-CM

## 2019-02-07 DIAGNOSIS — M85852 Other specified disorders of bone density and structure, left thigh: Secondary | ICD-10-CM

## 2019-02-07 MED ORDER — SODIUM CHLORIDE 0.9 % IV SOLN
510.0000 mg | Freq: Once | INTRAVENOUS | Status: AC
  Administered 2019-02-07: 14:00:00 510 mg via INTRAVENOUS
  Filled 2019-02-07: qty 510

## 2019-02-07 MED ORDER — SODIUM CHLORIDE 0.9 % IV SOLN
Freq: Once | INTRAVENOUS | Status: AC
  Administered 2019-02-07: 14:00:00 via INTRAVENOUS

## 2019-02-07 NOTE — Progress Notes (Signed)
Kristy Harris presents today for IV iron infusion. Infusion tolerated without incident or complaint. See MAR for details. VSS prior to and post infusion. Discharged in satisfactory condition with follow up instructions.

## 2019-02-07 NOTE — Patient Instructions (Signed)
Kristy Harris at [facility]  Discharge Instructions:  IV iron infusion received today. _______________________________________________________________  Thank you for choosing [place] at [facility] to provide your oncology and hematology care.  To afford each patient quality time with our providers, please arrive at least 15 minutes before your scheduled appointment.  You need to re-schedule your appointment if you arrive 10 or more minutes late.  We strive to give you quality time with our providers, and arriving late affects you and other patients whose appointments are after yours.  Also, if you no show three or more times for appointments you may be dismissed from the clinic.  Again, thank you for choosing [place] at [place] hope is that these requests will allow you access to exceptional care and in a timely manner. _______________________________________________________________  If you have questions after your visit, please contact our office at [phone] between the hours of 8:30 a.m. and 5:00 p.m. Voicemails left after 4:30 p.m. will not be returned until the following business day. _______________________________________________________________  For prescription refill requests, have your pharmacy contact our office. _______________________________________________________________  Recommendations made by the consultant and any test results will be sent to your referring physician. _______________________________________________________________

## 2019-02-14 ENCOUNTER — Other Ambulatory Visit: Payer: Self-pay

## 2019-02-14 ENCOUNTER — Inpatient Hospital Stay (HOSPITAL_COMMUNITY): Payer: Medicare Other

## 2019-02-14 VITALS — BP 141/47 | HR 71 | Temp 97.9°F | Resp 18

## 2019-02-14 DIAGNOSIS — D508 Other iron deficiency anemias: Secondary | ICD-10-CM

## 2019-02-14 DIAGNOSIS — D509 Iron deficiency anemia, unspecified: Secondary | ICD-10-CM | POA: Diagnosis not present

## 2019-02-14 DIAGNOSIS — M85852 Other specified disorders of bone density and structure, left thigh: Secondary | ICD-10-CM

## 2019-02-14 MED ORDER — SODIUM CHLORIDE 0.9 % IV SOLN: Freq: Once | INTRAVENOUS | Status: DC

## 2019-02-14 MED ORDER — SODIUM CHLORIDE 0.9 % IV SOLN
Freq: Once | INTRAVENOUS | Status: AC
  Administered 2019-02-14: 14:00:00 via INTRAVENOUS

## 2019-02-14 MED ORDER — SODIUM CHLORIDE 0.9 % IV SOLN
510.0000 mg | Freq: Once | INTRAVENOUS | Status: AC
  Administered 2019-02-14: 14:00:00 510 mg via INTRAVENOUS
  Filled 2019-02-14: qty 510

## 2019-02-14 MED ORDER — SODIUM CHLORIDE 0.9% FLUSH
10.0000 mL | Freq: Once | INTRAVENOUS | Status: AC | PRN
  Administered 2019-02-14: 10 mL

## 2019-02-14 NOTE — Progress Notes (Signed)
Patient tolerated iron infusion with no complaints voiced.  Peripheral IV site clean and dry with good blood return noted before and after infusion.  Band aid applied.  VSS with discharge and left by wheelchair with no s/s of distress noted.  

## 2019-03-28 ENCOUNTER — Inpatient Hospital Stay (HOSPITAL_COMMUNITY): Payer: Medicare Other

## 2019-03-28 ENCOUNTER — Other Ambulatory Visit (HOSPITAL_COMMUNITY): Payer: Medicare Other

## 2019-03-28 ENCOUNTER — Other Ambulatory Visit: Payer: Self-pay

## 2019-03-28 ENCOUNTER — Inpatient Hospital Stay (HOSPITAL_COMMUNITY): Payer: Medicare Other | Attending: Hematology

## 2019-03-28 ENCOUNTER — Ambulatory Visit (HOSPITAL_COMMUNITY): Payer: Medicare Other | Admitting: Hematology

## 2019-03-28 VITALS — BP 159/77 | HR 81 | Temp 97.8°F | Resp 18

## 2019-03-28 DIAGNOSIS — M8580 Other specified disorders of bone density and structure, unspecified site: Secondary | ICD-10-CM | POA: Insufficient documentation

## 2019-03-28 DIAGNOSIS — M85852 Other specified disorders of bone density and structure, left thigh: Secondary | ICD-10-CM

## 2019-03-28 DIAGNOSIS — C50412 Malignant neoplasm of upper-outer quadrant of left female breast: Secondary | ICD-10-CM | POA: Diagnosis not present

## 2019-03-28 DIAGNOSIS — C50919 Malignant neoplasm of unspecified site of unspecified female breast: Secondary | ICD-10-CM

## 2019-03-28 LAB — COMPREHENSIVE METABOLIC PANEL
ALT: 16 U/L (ref 0–44)
AST: 14 U/L — ABNORMAL LOW (ref 15–41)
Albumin: 3.7 g/dL (ref 3.5–5.0)
Alkaline Phosphatase: 80 U/L (ref 38–126)
Anion gap: 6 (ref 5–15)
BUN: 18 mg/dL (ref 8–23)
CO2: 33 mmol/L — ABNORMAL HIGH (ref 22–32)
Calcium: 9.5 mg/dL (ref 8.9–10.3)
Chloride: 100 mmol/L (ref 98–111)
Creatinine, Ser: 0.79 mg/dL (ref 0.44–1.00)
GFR calc Af Amer: >60 mL/min (ref >60)
GFR calc non Af Amer: >60 mL/min (ref >60)
Glucose, Bld: 142 mg/dL — ABNORMAL HIGH (ref 70–99)
Potassium: 3.8 mmol/L (ref 3.5–5.1)
Sodium: 139 mmol/L (ref 135–145)
Total Bilirubin: 0.4 mg/dL (ref 0.3–1.2)
Total Protein: 8 g/dL (ref 6.5–8.1)

## 2019-03-28 MED ORDER — DENOSUMAB 60 MG/ML ~~LOC~~ SOSY
60.0000 mg | PREFILLED_SYRINGE | Freq: Once | SUBCUTANEOUS | Status: AC
  Administered 2019-03-28: 60 mg via SUBCUTANEOUS
  Filled 2019-03-28: qty 1

## 2019-03-28 NOTE — Patient Instructions (Signed)
[place] at [facility]  Discharge Instructions:  Prolia injection received today. Follow up as previously scheduled. _______________________________________________________________  Thank you for choosing [place] at [facility] to provide your oncology and hematology care.  To afford each patient quality time with our providers, please arrive at least 15 minutes before your scheduled appointment.  You need to re-schedule your appointment if you arrive 10 or more minutes late.  We strive to give you quality time with our providers, and arriving late affects you and other patients whose appointments are after yours.  Also, if you no show three or more times for appointments you may be dismissed from the clinic.  Again, thank you for choosing [place] at [place] hope is that these requests will allow you access to exceptional care and in a timely manner. _______________________________________________________________  If you have questions after your visit, please contact our office at [phone] between the hours of 8:30 a.m. and 5:00 p.m. Voicemails left after 4:30 p.m. will not be returned until the following business day. _______________________________________________________________  For prescription refill requests, have your pharmacy contact our office. _______________________________________________________________  Recommendations made by the consultant and any test results will be sent to your referring physician. _______________________________________________________________

## 2019-03-28 NOTE — Progress Notes (Signed)
Kristy Harris presents today for denosumab injection. Lab work reviewed prior to administration. VSS. Pt reports taking Ca and Vit D as instructed. Pt denies tooth/jaw pain and denies recent or future invasive dental work. Injection tolerated well, see MAR for details. Site clean and dry, band aid applied. Pt discharged in satisfactory condition with follow up instructions.

## 2019-04-07 ENCOUNTER — Ambulatory Visit: Payer: Medicare Other | Attending: Internal Medicine

## 2019-04-07 DIAGNOSIS — Z23 Encounter for immunization: Secondary | ICD-10-CM | POA: Insufficient documentation

## 2019-04-07 NOTE — Progress Notes (Signed)
   Covid-19 Vaccination Clinic  Name:  Kristy Harris    MRN: [id number] DOB: [birth year]  04/07/2019  [name] was observed post Covid-19 immunization for 15 minutes without incidence. She was provided with Vaccine Information Sheet and instruction to access the V-Safe system.   [name] was instructed to call 911 with any severe reactions post vaccine: Marland Kitchen Difficulty breathing  . Swelling of your face and throat  . A fast heartbeat  . A bad rash all over your body  . Dizziness and weakness    Immunizations Administered    Name Date Dose VIS Date Route   Moderna COVID-19 Vaccine 04/07/2019 12:20 PM 0.5 mL 02/01/2019 Intramuscular   Manufacturer: Moderna   Lot: [id number]   [place] [id number]

## 2019-05-09 ENCOUNTER — Ambulatory Visit: Payer: Medicare Other | Attending: Internal Medicine

## 2019-05-09 DIAGNOSIS — Z23 Encounter for immunization: Secondary | ICD-10-CM | POA: Insufficient documentation

## 2019-05-09 NOTE — Progress Notes (Signed)
   Covid-19 Vaccination Clinic  Name:  Kristy Harris    MRN: [id number] DOB: 1945-07-02  05/09/2019  [name] was observed post Covid-19 immunization for 15 minutes without incident. She was provided with Vaccine Information Sheet and instruction to access the V-Safe system.   [name] was instructed to call 911 with any severe reactions post vaccine: Marland Kitchen Difficulty breathing  . Swelling of face and throat  . A fast heartbeat  . A bad rash all over body  . Dizziness and weakness   Immunizations Administered    Name Date Dose VIS Date Route   Moderna COVID-19 Vaccine 05/09/2019 12:27 PM 0.5 mL 02/01/2019 Intramuscular   Manufacturer: Moderna   Lot: [id number]   [id number]

## 2019-07-19 ENCOUNTER — Other Ambulatory Visit: Payer: Self-pay

## 2019-07-19 ENCOUNTER — Inpatient Hospital Stay (HOSPITAL_COMMUNITY): Payer: Medicare Other | Attending: Hematology

## 2019-07-19 DIAGNOSIS — Z170 Estrogen receptor positive status [ER+]: Secondary | ICD-10-CM | POA: Insufficient documentation

## 2019-07-19 DIAGNOSIS — D649 Anemia, unspecified: Secondary | ICD-10-CM | POA: Insufficient documentation

## 2019-07-19 DIAGNOSIS — C50412 Malignant neoplasm of upper-outer quadrant of left female breast: Secondary | ICD-10-CM | POA: Diagnosis not present

## 2019-07-19 LAB — IRON AND TIBC
Iron: 30 ug/dL (ref 28–170)
Saturation Ratios: 11 % (ref 10.4–31.8)
TIBC: 261 ug/dL (ref 250–450)
UIBC: 231 ug/dL

## 2019-07-19 LAB — CBC WITH DIFFERENTIAL/PLATELET
Abs Immature Granulocytes: 0.04 10*3/uL (ref 0.00–0.07)
Basophils Absolute: 0 10*3/uL (ref 0.0–0.1)
Basophils Relative: 0 %
Eosinophils Absolute: 0.2 10*3/uL (ref 0.0–0.5)
Eosinophils Relative: 3 %
HCT: 34.9 % — ABNORMAL LOW (ref 36.0–46.0)
Hemoglobin: 10.5 g/dL — ABNORMAL LOW (ref 12.0–15.0)
Immature Granulocytes: 1 %
Lymphocytes Relative: 22 %
Lymphs Abs: 1.7 10*3/uL (ref 0.7–4.0)
MCH: 27.3 pg (ref 26.0–34.0)
MCHC: 30.1 g/dL (ref 30.0–36.0)
MCV: 90.6 fL (ref 80.0–100.0)
Monocytes Absolute: 0.4 10*3/uL (ref 0.1–1.0)
Monocytes Relative: 6 %
Neutro Abs: 5.3 10*3/uL (ref 1.7–7.7)
Neutrophils Relative %: 68 %
Platelets: 270 10*3/uL (ref 150–400)
RBC: 3.85 MIL/uL — ABNORMAL LOW (ref 3.87–5.11)
RDW: 14.5 % (ref 11.5–15.5)
WBC: 7.7 10*3/uL (ref 4.0–10.5)
nRBC: 0 % (ref 0.0–0.2)

## 2019-07-19 LAB — VITAMIN B12: Vitamin B-12: 524 pg/mL (ref 180–914)

## 2019-07-19 LAB — FERRITIN: Ferritin: 413 ng/mL — ABNORMAL HIGH (ref 11–307)

## 2019-07-19 LAB — COMPREHENSIVE METABOLIC PANEL
ALT: 14 U/L (ref 0–44)
AST: 14 U/L — ABNORMAL LOW (ref 15–41)
Albumin: 3.4 g/dL — ABNORMAL LOW (ref 3.5–5.0)
Alkaline Phosphatase: 85 U/L (ref 38–126)
Anion gap: 13 (ref 5–15)
BUN: 18 mg/dL (ref 8–23)
CO2: 28 mmol/L (ref 22–32)
Calcium: 9.6 mg/dL (ref 8.9–10.3)
Chloride: 100 mmol/L (ref 98–111)
Creatinine, Ser: 0.74 mg/dL (ref 0.44–1.00)
GFR calc Af Amer: >60 mL/min (ref >60)
GFR calc non Af Amer: >60 mL/min (ref >60)
Glucose, Bld: 117 mg/dL — ABNORMAL HIGH (ref 70–99)
Potassium: 3.8 mmol/L (ref 3.5–5.1)
Sodium: 141 mmol/L (ref 135–145)
Total Bilirubin: 0.6 mg/dL (ref 0.3–1.2)
Total Protein: 7.9 g/dL (ref 6.5–8.1)

## 2019-07-19 LAB — VITAMIN D 25 HYDROXY (VIT D DEFICIENCY, FRACTURES): Vit D, 25-Hydroxy: 29.01 ng/mL — ABNORMAL LOW (ref 30–100)

## 2019-07-19 LAB — LACTATE DEHYDROGENASE: LDH: 122 U/L (ref 98–192)

## 2019-07-24 ENCOUNTER — Emergency Department (HOSPITAL_COMMUNITY): Payer: Medicare Other

## 2019-07-24 ENCOUNTER — Other Ambulatory Visit: Payer: Self-pay

## 2019-07-24 ENCOUNTER — Emergency Department (HOSPITAL_COMMUNITY)
Admission: EM | Admit: 2019-07-24 | Discharge: 2019-07-24 | Disposition: A | Discharge status: Home / Self Care | Payer: Medicare Other | Attending: Emergency Medicine | Admitting: Emergency Medicine

## 2019-07-24 ENCOUNTER — Encounter (HOSPITAL_COMMUNITY): Payer: Self-pay | Admitting: Emergency Medicine

## 2019-07-24 DIAGNOSIS — Z79899 Other long term (current) drug therapy: Secondary | ICD-10-CM | POA: Insufficient documentation

## 2019-07-24 DIAGNOSIS — I10 Essential (primary) hypertension: Secondary | ICD-10-CM | POA: Insufficient documentation

## 2019-07-24 DIAGNOSIS — M79601 Pain in right arm: Secondary | ICD-10-CM | POA: Insufficient documentation

## 2019-07-24 DIAGNOSIS — M542 Cervicalgia: Secondary | ICD-10-CM | POA: Insufficient documentation

## 2019-07-24 MED ORDER — HYDROCODONE-ACETAMINOPHEN 5-325 MG PO TABS: 1.0000 | ORAL_TABLET | Freq: Four times a day (QID) | ORAL | 0 refills | Status: DC | PRN

## 2019-07-24 MED ORDER — HYDROCODONE-ACETAMINOPHEN 5-325 MG PO TABS
1.0000 | ORAL_TABLET | Freq: Once | ORAL | Status: AC
  Administered 2019-07-24: 1 via ORAL
  Filled 2019-07-24: qty 1

## 2019-07-24 NOTE — Discharge Instructions (Addendum)
Follow-up with your family doctor in a couple weeks to recheck your neck

## 2019-07-24 NOTE — ED Provider Notes (Signed)
[facility] EMERGENCY DEPARTMENT Provider Note   CSN: [id number] Arrival date & time: 07/24/19  [place]     History Chief Complaint  Patient presents with  . Neck Pain    Kristy Harris is a 74 y.o. female.  Patient with neck pain which is radiating down her right arm.  This is been going on for a few days but getting worse.  Pain worse with movement of neck and arm  The history is provided by the patient. No language interpreter was used.  Neck Pain Pain location:  R side Quality:  Aching Pain radiates to:  R shoulder Pain severity:  Moderate Pain is:  Worse during the day Onset quality:  Sudden Timing:  Constant Progression:  Worsening Chronicity:  New Associated symptoms: no chest pain and no headaches        Past Medical History:  Diagnosis Date  . Anemia    takes iron supplement  . Arthritis    knees  . COPD (chronic obstructive pulmonary disease) ([place])   . Enlarged heart   . GERD (gastroesophageal reflux disease)   . Hidradenitis suppurativa    buttocks  . History of breast cancer 12/2014  . Hyperlipidemia   . Hypertension    states under control with meds., has been on med. > 40 yrs.  . Morbid obesity ([place])   . Non-insulin dependent type 2 diabetes mellitus ([place])   . Shortness of breath dyspnea    with exertion  . Urinary frequency     Patient Active Problem List   Diagnosis Date Noted  . Pressure injury of skin 11/03/2018  . Swelling of left hand 11/02/2018  . Osteopenia 08/14/2017  . Genetic testing 01/23/2015  . Family history of breast cancer 01/09/2015  . Encounter for pre-operative cardiovascular clearance 11/23/2014  . Aortic stenosis 11/23/2014  . Normal coronary arteries 11/23/2014  . Morbid obesity ([place]) 11/23/2014  . DJD (degenerative joint disease) 11/23/2014  . Breast cancer of upper-outer quadrant of left female breast ([place]) 11/13/2014  . Diverticulosis of colon with hemorrhage   . H. pylori infection 05/28/2014  . Abscess of  skin and subcutaneous tissue 12/08/2013  . Iron deficiency anemia 09/19/2013  . Abscess of left axilla 08/11/2013  . Abscess of left thigh 05/26/2013  . Abscess of buttock, right 06/13/2011  . Arteritis ([place]) 02/27/2011  . Abnormal cardiovascular function study 02/15/2011  . Peripheral vascular disease ([place]) 02/15/2011  . Diabetes mellitus ([place]) 02/15/2011  . Dyspnea 01/24/2011  . Hypertension 01/24/2011  . Hyperlipidemia 01/24/2011  . Obesity 01/24/2011  . Bruit 01/24/2011  . Murmur 01/24/2011    Past Surgical History:  Procedure Laterality Date  . ABDOMINAL HYSTERECTOMY  25 yrs ago   partial  . BLADDER SUSPENSION     x 2  . BREAST LUMPECTOMY WITH RADIOACTIVE SEED AND SENTINEL LYMPH NODE BIOPSY Left 12/25/2014   Procedure: RADIOACTIVE SEED GIUDED LEFT BREAST LUMPECTOMY, LEFT AXILLARY SENTINEL LYMPH NODE BIOPSY;  Surgeon: Alphonsa Overall, MD;  Location: [facility];  Service: General;  Laterality: Left;  . CARDIAC CATHETERIZATION  02/21/2011   Normal coronary arteries, normal EF  . COLONOSCOPY WITH PROPOFOL  11/18/2013  . ESOPHAGOGASTRODUODENOSCOPY (EGD) WITH PROPOFOL  11/18/2013  . EXCISION HYDRADENITIS LABIA N/A 12/08/2013   Procedure: EXCISION HIDRADENITIS PUBIC AREA;  Surgeon: Alphonsa Overall, MD;  Location: [facility];  Service: General;  Laterality: N/A;  . HYDRADENITIS EXCISION Left 12/08/2013   Procedure: EXCISION HIDRADENITIS AXILLA;  Surgeon: Alphonsa Overall, MD;  Location: [facility];  Service: General;  Laterality: Left;  . INCISION AND DRAINAGE ABSCESS  08/17/2008   perineum and buttock  . IRRIGATION AND DEBRIDEMENT ABSCESS Right 12/23/2012   Procedure: incision  AND DEBRIDEMENT right buttock infection ;  Surgeon: Shann Medal, MD;  Location: [facility];  Service: General;  Laterality: Right;  . PILONIDAL CYST / SINUS EXCISION  06/04/2004  . PILONIDAL CYST EXCISION    . PORT-A-CATH REMOVAL Right 03/21/2016   Procedure: MINOR REMOVAL PORT-A-CATH;  Surgeon: Alphonsa Overall, MD;  Location: [facility];  Service: General;  Laterality: Right;  MINOR REMOVAL PORT-A-CATH  . PORTACATH PLACEMENT N/A 01/12/2015   Procedure: INSERTION PORT-A-CATH;  Surgeon: Alphonsa Overall, MD;  Location: [facility];  Service: General;  Laterality: N/A;  . TONSILLECTOMY       OB History   No obstetric history on file.     Family History  Problem Relation Age of Onset  . Heart attack Mother        had multiple health problems  . Lung cancer Father 66       smoker  . Prostate cancer Brother   . Lung cancer Brother        dx. 65s; smoker  . Throat cancer Brother        dx. 13s; smoker  . Diabetes Sister        has 3 living sisters with multiple health problems  . Hypertension Son   . Hypertension Daughter   . Breast cancer Sister 70  . Breast cancer Sister        dx. 53s  . Breast cancer Maternal Grandmother        dx. 4s  . Breast cancer Maternal Aunt        dx. older than 51  . Dementia Maternal Aunt   . Breast cancer Cousin        dx. 44s  . Hypertension Brother     Social History   Tobacco Use  . Smoking status: Former Smoker    Years: 0.00    Quit date: 03/03/1994    Years since quitting: 25.4  . Smokeless tobacco: Never Used  Substance Use Topics  . Alcohol use: No  . Drug use: No    Home Medications Prior to Admission medications   Medication Sig Start Date End Date Taking? Authorizing Provider  acetaminophen (TYLENOL) 500 MG tablet Take 1,000 mg by mouth every 6 (six) hours as needed for mild pain or headache.    [provider]  albuterol (PROVENTIL HFA;VENTOLIN HFA) 108 (90 BASE) MCG/ACT inhaler Inhale 2 puffs into the lungs every 4 (four) hours as needed for shortness of breath.    [provider]  anastrozole (ARIMIDEX) 1 MG tablet Take 1 tablet (1 mg total) by mouth daily. 01/24/19   Lockamy, Randi L, NP-C  atorvastatin (LIPITOR) 20 MG tablet Take 20 mg by mouth every morning.     [provider]  Black Cohosh (BLACK COHOSH HOT  FLASH RELIEF) 40 MG CAPS Take 1 capsule (40 mg total) by mouth every morning. 01/24/19   Lockamy, Randi L, NP-C  Blood Glucose Monitoring Suppl (BLOOD GLUCOSE MONITOR SYSTEM) w/Device KIT With lancets and strips #100 6rf Dx: E11.9 Test bid 05/06/17   [provider]  Calcium 150 MG TABS Take 1 tablet by mouth daily.  08/11/18   [provider]  cholecalciferol (VITAMIN D) 1000 units tablet Take 2,000 Units by mouth daily.    [provider]  ferrous  sulfate 325 (65 FE) MG tablet Take 325 mg by mouth 3 (three) times daily with meals.    [provider]  fluconazole (DIFLUCAN) 150 MG tablet Take 150 mg by mouth as needed.  08/07/17   [provider]  furosemide (LASIX) 20 MG tablet Take 20 mg by mouth 3 (three) times a week.    [provider]  glipiZIDE (GLUCOTROL XL) 10 MG 24 hr tablet Take 10 mg by mouth daily with breakfast.  02/18/17   [provider]  HYDROcodone-acetaminophen (NORCO/VICODIN) 5-325 MG tablet Take 1 tablet by mouth every 6 (six) hours as needed. 07/24/19   Milton Ferguson, MD  losartan (COZAAR) 100 MG tablet Take 100 mg by mouth daily.    [provider]  metFORMIN (GLUCOPHAGE-XR) 500 MG 24 hr tablet TAKE ONE TABLET BY MOUTH TWICE DAILY 03/24/16   [provider]  metoprolol succinate (TOPROL-XL) 100 MG 24 hr tablet TAKE ONE (1) TABLET EACH DAY 10/25/15   [provider]  Misc. Devices MISC Underpad's 6 Bags/15-90 per month  Incontinence Pads 3 Bags/20-60 per month  2 Box of Gloves per month  40 box of gauze  20 rolls of tape  Nutrition supplement/30 day supply 07/03/15   [provider]  pantoprazole (PROTONIX) 40 MG tablet TAKE 1 TABLET (40 MG TOTAL) BY MOUTH DAILY. Patient taking differently: Take 40 mg by mouth as needed.  02/06/15   Mauri Pole, MD  polyethylene glycol (MIRALAX / GLYCOLAX) packet Take 17 g by mouth daily as needed for mild constipation. Patient not taking:  Reported on 01/24/2019 05/30/14   Robbie Lis, MD  sodium chloride irrigation 0.9 % irrigation USE AS DIRECTED 08/06/17   [provider]  verapamil (CALAN-SR) 120 MG CR tablet Take 120 mg by mouth daily. 12/30/14   [provider]    Allergies    Lisinopril and Penicillins  Review of Systems   Review of Systems  Constitutional: Negative for appetite change and fatigue.  HENT: Negative for congestion, ear discharge and sinus pressure.   Eyes: Negative for discharge.  Respiratory: Negative for cough.   Cardiovascular: Negative for chest pain.  Gastrointestinal: Negative for abdominal pain and diarrhea.  Genitourinary: Negative for frequency and hematuria.  Musculoskeletal: Positive for neck pain. Negative for back pain.  Skin: Negative for rash.  Neurological: Negative for seizures and headaches.  Psychiatric/Behavioral: Negative for hallucinations.    Physical Exam Updated Vital Signs BP (!) 148/84   Pulse 83   Temp 99.3 F (37.4 C) (Oral)   Resp 18   Ht '5\' 4"'$  (1.626 m)   Wt 127 kg   SpO2 95%   BMI 48.06 kg/m   Physical Exam Vitals and nursing note reviewed.  Constitutional:      Appearance: She is well-developed.  HENT:     Head: Normocephalic.     Nose: Nose normal.  Eyes:     General: No scleral icterus.    Conjunctiva/sclera: Conjunctivae normal.  Neck:     Thyroid: No thyromegaly.     Comments: Tender posterior neck Cardiovascular:     Rate and Rhythm: Normal rate and regular rhythm.     Heart sounds: No murmur. No friction rub. No gallop.   Pulmonary:     Breath sounds: No stridor. No wheezing or rales.  Chest:     Chest wall: No tenderness.  Abdominal:     General: There is no distension.     Tenderness: There is no abdominal  tenderness. There is no rebound.  Musculoskeletal:        General: Normal range of motion.     Cervical back: Neck supple.  Lymphadenopathy:     Cervical: No cervical adenopathy.  Skin:    Findings: No  erythema or rash.  Neurological:     Mental Status: She is alert and oriented to person, place, and time.     Motor: No abnormal muscle tone.     Coordination: Coordination normal.     Comments: No neuro deficit in extremities  Psychiatric:        Behavior: Behavior normal.     ED Results / Procedures / Treatments   Labs (all labs ordered are listed, but only abnormal results are displayed) Labs Reviewed - No data to display  EKG None  Radiology DG Cervical Spine 2-3 Views  Result Date: 07/24/2019 CLINICAL DATA:  Neck pain. EXAM: CERVICAL SPINE - 2-3 VIEW COMPARISON:  None. FINDINGS: There is straightening of normal lordosis without traumatic malalignment. Severe degenerative disc disease at multiple levels with anterior osteophytes. No fractures are identified. Mild prominence of the prevertebral soft tissues. Uncovertebral degenerative changes. Lateral masses of C1 align with C2. Limited views of the odontoid process are unremarkable. IMPRESSION: 1. Severe degenerative changes.  No fractures noted. 2. Prominence of the prevertebral soft tissues is nonspecific given the lack of trauma. Electronically Signed   By: Dorise Bullion III M.D   On: 07/24/2019 19:56    Procedures Procedures (including critical care time)  Medications Ordered in ED Medications  HYDROcodone-acetaminophen (NORCO/VICODIN) 5-325 MG per tablet 1 tablet (has no administration in time range)    ED Course  I have reviewed the triage vital signs and the nursing notes.  Pertinent labs & imaging results that were available during my care of the patient were reviewed by me and considered in my medical decision making (see chart for details).    MDM Rules/Calculators/A&P                      Patient has severe degenerative disease in her neck.  She will be sent home on some Vicodin and will follow up with her PCP      This patient presents to the ED for concern of neck pain, this involves an extensive  number of treatment options, and is a complaint that carries with it a high risk of complications and morbidity.  The differential diagnosis includes arthritis tumor   Lab Tests:     Medicines ordered:   I ordered medication Vicodin for pain  Imaging Studies ordered:   I ordered imaging studies which included plain films of the neck and  I independently visualized and interpreted imaging which showed significant arthritis  Additional history obtained:   Additional history obtained from significant other  Previous records obtained and reviewed   Consultations Obtained:     Reevaluation:  After the interventions stated above, I reevaluated the patient and found mild improvement  Critical Interventions:  .   Final Clinical Impression(s) / ED Diagnoses Final diagnoses:  Neck pain    Rx / DC Orders ED Discharge Orders         Ordered    HYDROcodone-acetaminophen (NORCO/VICODIN) 5-325 MG tablet  Every 6 hours PRN     07/24/19 2141           Milton Ferguson, MD 07/25/19 1022

## 2019-07-24 NOTE — ED Triage Notes (Signed)
Pt states she has been having neck and shoulder stiffness and pain over a week. Has been using Icy hot for pain. States she also hears some bubbling in her ear

## 2019-07-26 ENCOUNTER — Inpatient Hospital Stay (HOSPITAL_BASED_OUTPATIENT_CLINIC_OR_DEPARTMENT_OTHER): Payer: Medicare Other | Admitting: Nurse Practitioner

## 2019-07-26 ENCOUNTER — Other Ambulatory Visit: Payer: Self-pay

## 2019-07-26 ENCOUNTER — Ambulatory Visit (HOSPITAL_COMMUNITY): Payer: Medicare Other | Admitting: Nurse Practitioner

## 2019-07-26 DIAGNOSIS — M85852 Other specified disorders of bone density and structure, left thigh: Secondary | ICD-10-CM | POA: Diagnosis not present

## 2019-07-26 DIAGNOSIS — C50412 Malignant neoplasm of upper-outer quadrant of left female breast: Secondary | ICD-10-CM | POA: Diagnosis not present

## 2019-07-26 DIAGNOSIS — Z170 Estrogen receptor positive status [ER+]: Secondary | ICD-10-CM

## 2019-07-26 NOTE — Progress Notes (Signed)
[facility] Follow up:    Kristy Hartshorn, NP [address] [place] [address]   DIAGNOSIS: Breast cancer and anemia   Cancer Staging Breast cancer of upper-outer quadrant of left female breast [facility]) Staging form: Breast, AJCC 7th Edition - Clinical stage from 02/08/2015: Stage IIA (T1c, N1, M0) - Signed by [facility], PA-C on 02/08/2015   SUMMARY OF ONCOLOGIC HISTORY: Oncology History  Breast cancer of upper-outer quadrant of left female breast ([place])  11/01/2014 Mammogram   Possible mass in the left breast upper outer quadrant measuring 13 mm suspicious for breast cancer confirmed through ultrasound a spiculated hypoechoic mass ill-defined, no enlarged lymph nodes   11/01/2014 Initial Diagnosis   Invasive ductal carcinoma, moderately differentiated, 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 The estimated ejection fraction was in the range of 55% to 60%.    12/25/2014 Surgery   Left lumpectomy: IDC 1.7 cm, positive for LVI, with DCIS, 1/1 sentinel node positive deposit 1.9 cm with extracapsular extension, ER 90%, PR 90%, HER-2 positive ratio 2.4, Ki-67 29% T1 cN1 stage II a   02/01/2015 - 04/19/2015 Chemotherapy   Abraxane/Herceptin   03/20/2015 Echocardiogram   Systolic function wasnormal. The estimated ejection fraction was in the range of 60%to 65%.   05/08/2015 - 06/22/2015 Radiation Therapy   Dr. Lisbeth Renshaw    05/10/2015 - 02/07/2016 Antibody Plan   Herceptin every 21 days to complete 52 weeks worth of therapy.   06/13/2015 Echocardiogram   2D echo- The estimated ejection fraction was in the range of 60% to 65%. Diastolic function is abnormal, indeterminate grade. Wallmotion was normal.   06/28/2015 Imaging   Bone density- BMD as determined from Femur Total Left is 0.994 g/cm2 with a T-Score of -0.1. This patient is  considered normal according to [facility]) criteria.    07/13/2015 -  Anti-estrogen oral therapy   Arimidex daily   09/12/2015 Echocardiogram   The estimated ejection fraction was in the range of 60% to 65%. Wall motion was normal; there were no regional wall motion abnormalities. Doppler parameters are consistent with abnormal L ventricular relaxation (grade 1 diastolic dysfunction).   12/04/2015 Echocardiogram   Left ventricle: The cavity size was normal. Wall thickness was   increased increased in a pattern of mild to moderate LVH.   Systolic function was normal. The estimated ejection fraction was   in the range of 60% to 65%. Wall motion was normal; there were no   regional wall motion abnormalities. Features are consistent with   a pseudonormal left ventricular filling pattern, with concomitant   abnormal relaxation and increased filling pressure (grade 2   diastolic dysfunction). Doppler parameters are consistent with   high ventricular filling pressure.   03/21/2016 Procedure   Port removed by Dr. Lucia Gaskins     CURRENT THERAPY: Anastrozole and intermittent iron infusions  INTERVAL HISTORY: Kristy Harris 74 y.o. female was called for telephone visit for breast cancer and anemia.  Patient reports she does still have some mild fatigue throughout the day.  She reports that the iron infusions we gave her last time did help her some.  She denies any bright red bleeding per rectum or melena.  She denies any easy bruising or bleeding.  She denies any new pains. Denies any nausea, vomiting, or diarrhea. Denies  any new pains. Had not noticed any recent bleeding such as epistaxis, hematuria or hematochezia. Denies recent chest pain on exertion, shortness of breath on minimal exertion, pre-syncopal episodes, or palpitations. Denies any numbness or tingling in hands or feet. Denies any recent fevers, infections, or recent hospitalizations. Patient reports appetite at 100% and  energy level at 50%.  She is eating well maintain her weight at this time.    Patient Active Problem List   Diagnosis Date Noted  . Pressure injury of skin 11/03/2018  . Swelling of left hand 11/02/2018  . Osteopenia 08/14/2017  . Genetic testing 01/23/2015  . Family history of breast cancer 01/09/2015  . Encounter for pre-operative cardiovascular clearance 11/23/2014  . Aortic stenosis 11/23/2014  . Normal coronary arteries 11/23/2014  . Morbid obesity ([place]) 11/23/2014  . DJD (degenerative joint disease) 11/23/2014  . Breast cancer of upper-outer quadrant of left female breast ([place]) 11/13/2014  . Diverticulosis of colon with hemorrhage   . H. pylori infection 05/28/2014  . Abscess of skin and subcutaneous tissue 12/08/2013  . Iron deficiency anemia 09/19/2013  . Abscess of left axilla 08/11/2013  . Abscess of left thigh 05/26/2013  . Abscess of buttock, right 06/13/2011  . Arteritis ([place]) 02/27/2011  . Abnormal cardiovascular function study 02/15/2011  . Peripheral vascular disease ([place]) 02/15/2011  . Diabetes mellitus ([place]) 02/15/2011  . Dyspnea 01/24/2011  . Hypertension 01/24/2011  . Hyperlipidemia 01/24/2011  . Obesity 01/24/2011  . Bruit 01/24/2011  . Murmur 01/24/2011    is allergic to lisinopril and penicillins.  MEDICAL HISTORY: Past Medical History:  Diagnosis Date  . Anemia    takes iron supplement  . Arthritis    knees  . COPD (chronic obstructive pulmonary disease) ([employer])   . Enlarged heart   . GERD (gastroesophageal reflux disease)   . Hidradenitis suppurativa    buttocks  . History of breast cancer 12/2014  . Hyperlipidemia   . Hypertension    states under control with meds., has been on med. > 40 yrs.  . Morbid obesity ([place])   . Non-insulin dependent type 2 diabetes mellitus ([place])   . Shortness of breath dyspnea    with exertion  . Urinary frequency     SURGICAL HISTORY: Past Surgical History:  Procedure Laterality Date  . ABDOMINAL  HYSTERECTOMY  25 yrs ago   partial  . BLADDER SUSPENSION     x 2  . BREAST LUMPECTOMY WITH RADIOACTIVE SEED AND SENTINEL LYMPH NODE BIOPSY Left 12/25/2014   Procedure: RADIOACTIVE SEED GIUDED LEFT BREAST LUMPECTOMY, LEFT AXILLARY SENTINEL LYMPH NODE BIOPSY;  Surgeon: Alphonsa Overall, MD;  Location: [facility];  Service: General;  Laterality: Left;  . CARDIAC CATHETERIZATION  02/21/2011   Normal coronary arteries, normal EF  . COLONOSCOPY WITH PROPOFOL  11/18/2013  . ESOPHAGOGASTRODUODENOSCOPY (EGD) WITH PROPOFOL  11/18/2013  . EXCISION HYDRADENITIS LABIA N/A 12/08/2013   Procedure: EXCISION HIDRADENITIS PUBIC AREA;  Surgeon: Alphonsa Overall, MD;  Location: [facility];  Service: General;  Laterality: N/A;  . HYDRADENITIS EXCISION Left 12/08/2013   Procedure: EXCISION HIDRADENITIS AXILLA;  Surgeon: Alphonsa Overall, MD;  Location: [facility];  Service: General;  Laterality: Left;  . INCISION AND DRAINAGE ABSCESS  08/17/2008   perineum and buttock  . IRRIGATION AND DEBRIDEMENT ABSCESS Right 12/23/2012   Procedure: incision  AND DEBRIDEMENT right buttock infection ;  Surgeon: Shann Medal, MD;  Location: [facility];  Service: General;  Laterality: Right;  . PILONIDAL CYST / SINUS EXCISION  06/04/2004  .  PILONIDAL CYST EXCISION    . PORT-A-CATH REMOVAL Right 03/21/2016   Procedure: MINOR REMOVAL PORT-A-CATH;  Surgeon: Alphonsa Overall, MD;  Location: [facility];  Service: General;  Laterality: Right;  MINOR REMOVAL PORT-A-CATH  . PORTACATH PLACEMENT N/A 01/12/2015   Procedure: INSERTION PORT-A-CATH;  Surgeon: Alphonsa Overall, MD;  Location: [facility];  Service: General;  Laterality: N/A;  . TONSILLECTOMY      SOCIAL HISTORY: Social History   Socioeconomic History  . Marital status: Widowed    Spouse name: Not on file  . Number of children: 5  . Years of education: Not on file  . Highest education level: Not on file  Occupational History  . Occupation: Textile work    Comment: Disabled  Tobacco Use  .  Smoking status: Former Smoker    Years: 0.00    Quit date: 03/03/1994    Years since quitting: 25.4  . Smokeless tobacco: Never Used  Substance and Sexual Activity  . Alcohol use: No  . Drug use: No  . Sexual activity: Not on file  Other Topics Concern  . Not on file  Social History Narrative   Lives with grandson.  Widowed.  Has 2 sons and 3 daughters   Social Determinants of Radio broadcast assistant Strain:   . Difficulty of Paying Living Expenses:   Food Insecurity:   . Worried About Charity fundraiser in the Last Year:   . Arboriculturist in the Last Year:   Transportation Needs:   . Film/video editor (Medical):   Marland Kitchen Lack of Transportation (Non-Medical):   Physical Activity:   . Days of Exercise per Week:   . Minutes of Exercise per Session:   Stress:   . Feeling of Stress :   Social Connections:   . Frequency of Communication with Friends and Family:   . Frequency of Social Gatherings with Friends and Family:   . Attends Religious Services:   . Active Member of Clubs or Organizations:   . Attends Archivist Meetings:   Marland Kitchen Marital Status:   Intimate Partner Violence:   . Fear of Current or Ex-Partner:   . Emotionally Abused:   Marland Kitchen Physically Abused:   . Sexually Abused:     FAMILY HISTORY: Family History  Problem Relation Age of Onset  . Heart attack Mother        had multiple health problems  . Lung cancer Father 11       smoker  . Prostate cancer Brother   . Lung cancer Brother        dx. 73s; smoker  . Throat cancer Brother        dx. 57s; smoker  . Diabetes Sister        has 3 living sisters with multiple health problems  . Hypertension Son   . Hypertension Daughter   . Breast cancer Sister 70  . Breast cancer Sister        dx. 31s  . Breast cancer Maternal Grandmother        dx. 33s  . Breast cancer Maternal Aunt        dx. older than 87  . Dementia Maternal Aunt   . Breast cancer Cousin        dx. 68s  . Hypertension Brother      Review of Systems  Constitutional: Positive for fatigue.  All other systems reviewed and are negative.    Vital signs: -Deferred due to  telephone visit  Physical Exam -Deferred due to telephone visit -Patient was alert and oriented over the phone and in no acute distress   LABORATORY DATA:  CBC    Component Value Date/Time   WBC 7.7 07/19/2019 1059   RBC 3.85 (L) 07/19/2019 1059   HGB 10.5 (L) 07/19/2019 1059   HCT 34.9 (L) 07/19/2019 1059   PLT 270 07/19/2019 1059   MCV 90.6 07/19/2019 1059   MCH 27.3 07/19/2019 1059   MCHC 30.1 07/19/2019 1059   RDW 14.5 07/19/2019 1059   LYMPHSABS 1.7 07/19/2019 1059   MONOABS 0.4 07/19/2019 1059   EOSABS 0.2 07/19/2019 1059   BASOSABS 0.0 07/19/2019 1059    CMP     Component Value Date/Time   NA 141 07/19/2019 1059   K 3.8 07/19/2019 1059   CL 100 07/19/2019 1059   CO2 28 07/19/2019 1059   GLUCOSE 117 (H) 07/19/2019 1059   BUN 18 07/19/2019 1059   CREATININE 0.74 07/19/2019 1059   CALCIUM 9.6 07/19/2019 1059   PROT 7.9 07/19/2019 1059   ALBUMIN 3.4 (L) 07/19/2019 1059   AST 14 (L) 07/19/2019 1059   ALT 14 07/19/2019 1059   ALKPHOS 85 07/19/2019 1059   BILITOT 0.6 07/19/2019 1059   GFRNONAA >60 07/19/2019 1059   GFRAA >60 07/19/2019 1059    RADIOGRAPHIC STUDIES:  DG Cervical Spine 2-3 Views  Result Date: 07/24/2019 CLINICAL DATA:  Neck pain. EXAM: CERVICAL SPINE - 2-3 VIEW COMPARISON:  None. FINDINGS: There is straightening of normal lordosis without traumatic malalignment. Severe degenerative disc disease at multiple levels with anterior osteophytes. No fractures are identified. Mild prominence of the prevertebral soft tissues. Uncovertebral degenerative changes. Lateral masses of C1 align with C2. Limited views of the odontoid process are unremarkable. IMPRESSION: 1. Severe degenerative changes.  No fractures noted. 2. Prominence of the prevertebral soft tissues is nonspecific given the lack of trauma.  Electronically Signed   By: Dorise Bullion III M.D   On: 07/24/2019 19:56   All questions were answered to patient's stated satisfaction. Encouraged patient to call with any new concerns or questions before his next visit to the cancer center and we can certain see him sooner, if needed.     ASSESSMENT and THERAPY PLAN:   Breast cancer of upper-outer quadrant of left female breast ([place]) 1.  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 -Status post left lumpectomy on 12/25/2014 followed by adjuvant chemotherapy consisting of Abraxane/Herceptin from 02/01/2015-04/19/2015. -He underwent XRT by Dr. Lisbeth Renshaw from 05/08/2015-06/22/2015 with continuation of Herceptin x52 weeks finishing on 02/07/2016. -Anastrozole was started May 2017.  She is tolerating it very well.  She does have occasional hot flashes that are minor and stable. -Today's physical examination did not reveal any palpable masses in bilateral breasts.  Left breast upper outer quadrant lumpectomy scar is stable.  No palpable adenopathy. -Last mammogram was on 12/07/2018 showed B RADS category 2 benign. -Labs done on 07/19/2019 showed WBC 7.7, hemoglobin 10.5, platelets 270, creatinine 0.74, LDH 122, ferritin 413, percent saturation 11 -We will see her back in 6 months with repeat labs and mammogram.  2.  Osteopenia: -Last DEXA scan on 06/12/2017 showed T score of -2.3 in the femoral neck. -Prolia was started on 09/18/2017.  She is tolerating well. -She will continue to take calcium and vitamin D daily. -She will get a DEXA scan prior to her next visit.  3.  Normocytic anemia: -Labs  on 07/19/2019 showed hemoglobin 10.5, ferritin 413, percent saturation 11 -She is currently taking iron 3 tablets a day.  She is tolerating it well.  However she forgets to take the medication sometimes. -Her last dose of IV iron was 02/07/2019 and 02/14/2019 -She reports she did see some  improvement in her fatigue after the last iron infusions. -We will repeat labs on her next visit.   Orders Placed This Encounter  Procedures  . DG Bone Density    Standing Status:   Future    Standing Expiration Date:   07/25/2020    Order Specific Question:   Reason for Exam (SYMPTOM  OR DIAGNOSIS REQUIRED)    Answer:   osteopenia    Order Specific Question:   Preferred imaging location?    Answer:   [place] [facility]    Standing Status:   Future    Standing Expiration Date:   07/25/2020    Order Specific Question:   Reason for Exam (SYMPTOM  OR DIAGNOSIS REQUIRED)    Answer:   breaat cancer    Order Specific Question:   Preferred imaging location?    Answer:   [facility]  . Lactate dehydrogenase    Standing Status:   Future    Standing Expiration Date:   07/25/2020  . CBC with Differential/Platelet    Standing Status:   Future    Standing Expiration Date:   07/25/2020  . Comprehensive metabolic panel    Standing Status:   Future    Standing Expiration Date:   07/25/2020  . Ferritin    Standing Status:   Future    Standing Expiration Date:   07/25/2020  . Iron and TIBC    Standing Status:   Future    Standing Expiration Date:   07/25/2020  . Vitamin B12    Standing Status:   Future    Standing Expiration Date:   07/25/2020  . VITAMIN D 25 Hydroxy (Vit-D Deficiency, Fractures)    Standing Status:   Future    Standing Expiration Date:   07/25/2020  . Folate    Standing Status:   Future    Standing Expiration Date:   07/25/2020    All questions were answered. The patient knows to call the clinic with any problems, questions or concerns. We can certainly see the patient much sooner if necessary. This note was electronically signed.  I provided 29 minutes of non face-to-face telephone visit time during this encounter, and > 50% was spent counseling as documented under my assessment & plan.   Glennie Isle, NP-C 07/26/2019

## 2019-07-26 NOTE — Assessment & Plan Note (Signed)
1.  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 -Status post left lumpectomy on 12/25/2014 followed by adjuvant chemotherapy consisting of Abraxane/Herceptin from 02/01/2015-04/19/2015. -He underwent XRT by Dr. Lisbeth Renshaw from 05/08/2015-06/22/2015 with continuation of Herceptin x52 weeks finishing on 02/07/2016. -Anastrozole was started May 2017.  She is tolerating it very well.  She does have occasional hot flashes that are minor and stable. -Today's physical examination did not reveal any palpable masses in bilateral breasts.  Left breast upper outer quadrant lumpectomy scar is stable.  No palpable adenopathy. -Last mammogram was on 12/07/2018 showed B RADS category 2 benign. -Labs done on 07/19/2019 showed WBC 7.7, hemoglobin 10.5, platelets 270, creatinine 0.74, LDH 122, ferritin 413, percent saturation 11 -We will see her back in 6 months with repeat labs and mammogram.  2.  Osteopenia: -Last DEXA scan on 06/12/2017 showed T score of -2.3 in the femoral neck. -Prolia was started on 09/18/2017.  She is tolerating well. -She will continue to take calcium and vitamin D daily. -She will get a DEXA scan prior to her next visit.  3.  Normocytic anemia: -Labs on 07/19/2019 showed hemoglobin 10.5, ferritin 413, percent saturation 11 -She is currently taking iron 3 tablets a day.  She is tolerating it well.  However she forgets to take the medication sometimes. -Her last dose of IV iron was 02/07/2019 and 02/14/2019 -She reports she did see some improvement in her fatigue after the last iron infusions. -We will repeat labs on her next visit.

## 2019-09-23 ENCOUNTER — Other Ambulatory Visit (HOSPITAL_COMMUNITY): Payer: Self-pay | Admitting: *Deleted

## 2019-09-23 DIAGNOSIS — M85852 Other specified disorders of bone density and structure, left thigh: Secondary | ICD-10-CM

## 2019-09-23 DIAGNOSIS — C50412 Malignant neoplasm of upper-outer quadrant of left female breast: Secondary | ICD-10-CM

## 2019-09-26 ENCOUNTER — Other Ambulatory Visit: Payer: Self-pay

## 2019-09-26 ENCOUNTER — Encounter (HOSPITAL_COMMUNITY): Payer: Self-pay

## 2019-09-26 ENCOUNTER — Inpatient Hospital Stay (HOSPITAL_COMMUNITY): Payer: Medicare Other

## 2019-09-26 ENCOUNTER — Inpatient Hospital Stay (HOSPITAL_COMMUNITY): Payer: Medicare Other | Attending: Hematology

## 2019-09-26 VITALS — BP 131/48 | HR 73 | Temp 96.9°F | Resp 18

## 2019-09-26 DIAGNOSIS — M8580 Other specified disorders of bone density and structure, unspecified site: Secondary | ICD-10-CM | POA: Diagnosis not present

## 2019-09-26 DIAGNOSIS — M85852 Other specified disorders of bone density and structure, left thigh: Secondary | ICD-10-CM

## 2019-09-26 DIAGNOSIS — Z170 Estrogen receptor positive status [ER+]: Secondary | ICD-10-CM

## 2019-09-26 LAB — COMPREHENSIVE METABOLIC PANEL
ALT: 13 U/L (ref 0–44)
AST: 11 U/L — ABNORMAL LOW (ref 15–41)
Albumin: 3.1 g/dL — ABNORMAL LOW (ref 3.5–5.0)
Alkaline Phosphatase: 78 U/L (ref 38–126)
Anion gap: 12 (ref 5–15)
BUN: 18 mg/dL (ref 8–23)
CO2: 27 mmol/L (ref 22–32)
Calcium: 9.2 mg/dL (ref 8.9–10.3)
Chloride: 99 mmol/L (ref 98–111)
Creatinine, Ser: 0.75 mg/dL (ref 0.44–1.00)
GFR calc Af Amer: >60 mL/min (ref >60)
GFR calc non Af Amer: >60 mL/min (ref >60)
Glucose, Bld: 153 mg/dL — ABNORMAL HIGH (ref 70–99)
Potassium: 3.7 mmol/L (ref 3.5–5.1)
Sodium: 138 mmol/L (ref 135–145)
Total Bilirubin: 0.5 mg/dL (ref 0.3–1.2)
Total Protein: 7.6 g/dL (ref 6.5–8.1)

## 2019-09-26 MED ORDER — DENOSUMAB 60 MG/ML ~~LOC~~ SOSY
60.0000 mg | PREFILLED_SYRINGE | Freq: Once | SUBCUTANEOUS | Status: AC
  Administered 2019-09-26: 60 mg via SUBCUTANEOUS
  Filled 2019-09-26: qty 1

## 2019-09-26 NOTE — Progress Notes (Signed)
Kila L Andel tolerated Prolia injection well without complaints or incident. Calcium 9.2 today and pt denied any tooth or jaw pain and no recent or future dental visits prior to administering this medication. VSS. Pt continues to take her Calcium PO as prescribed without issues. Pt discharged via wheelchair in satisfactory condition

## 2019-09-26 NOTE — Patient Instructions (Signed)
[facility] Discharge Instructions  Received Prolia injection today. Follow-up as scheduled   Thank you for choosing [facility] to provide your oncology and hematology care.  To afford each patient quality time with our provider, please arrive at least 15 minutes before your scheduled appointment time.   If you have a lab appointment with the Cancer Center please come in thru the Main Entrance and check in at the main information desk.  You need to re-schedule your appointment should you arrive 10 or more minutes late.  We strive to give you quality time with our providers, and arriving late affects you and other patients whose appointments are after yours.  Also, if you no show three or more times for appointments you may be dismissed from the clinic at the providers discretion.     Again, thank you for choosing [facility].  Our hope is that these requests will decrease the amount of time that you wait before being seen by our physicians.       _____________________________________________________________  Should you have questions after your visit to [facility], please contact our office at [phone] and follow the prompts.  Our office hours are 8:00 a.m. and 4:30 p.m. Monday - Friday.  Please note that voicemails left after 4:00 p.m. may not be returned until the following business day.  We are closed weekends and major holidays.  You do have access to a nurse 24-7, just call the main number to the clinic [phone] and do not press any options, hold on the line and a nurse will answer the phone.    For prescription refill requests, have your pharmacy contact our office and allow 72 hours.    Due to Covid, you will need to wear a mask upon entering the hospital. If you do not have a mask, a mask will be given to you at the Main Entrance upon arrival. For doctor visits, patients may have  1 support person age 18 or older with them. For treatment visits, patients can not have anyone with them due to social distancing guidelines and our immunocompromised population.     

## 2019-11-29 ENCOUNTER — Other Ambulatory Visit (HOSPITAL_COMMUNITY): Payer: Self-pay | Admitting: Nurse Practitioner

## 2019-11-29 DIAGNOSIS — Z9889 Other specified postprocedural states: Secondary | ICD-10-CM

## 2019-12-19 ENCOUNTER — Other Ambulatory Visit (HOSPITAL_COMMUNITY): Payer: Self-pay | Admitting: Surgery

## 2019-12-19 DIAGNOSIS — Z170 Estrogen receptor positive status [ER+]: Secondary | ICD-10-CM

## 2019-12-19 DIAGNOSIS — D508 Other iron deficiency anemias: Secondary | ICD-10-CM

## 2019-12-20 ENCOUNTER — Ambulatory Visit (HOSPITAL_COMMUNITY)
Admission: RE | Admit: 2019-12-20 | Discharge: 2019-12-20 | Disposition: A | Discharge status: Home / Self Care | Payer: Medicare Other | Source: Ambulatory Visit | Attending: Nurse Practitioner | Admitting: Nurse Practitioner

## 2019-12-20 ENCOUNTER — Inpatient Hospital Stay (HOSPITAL_COMMUNITY): Payer: Medicare Other | Attending: Hematology

## 2019-12-20 ENCOUNTER — Other Ambulatory Visit: Payer: Self-pay

## 2019-12-20 DIAGNOSIS — M85852 Other specified disorders of bone density and structure, left thigh: Secondary | ICD-10-CM

## 2019-12-20 DIAGNOSIS — C50412 Malignant neoplasm of upper-outer quadrant of left female breast: Secondary | ICD-10-CM | POA: Diagnosis present

## 2019-12-20 DIAGNOSIS — L732 Hidradenitis suppurativa: Secondary | ICD-10-CM | POA: Insufficient documentation

## 2019-12-20 DIAGNOSIS — N951 Menopausal and female climacteric states: Secondary | ICD-10-CM | POA: Diagnosis not present

## 2019-12-20 DIAGNOSIS — D649 Anemia, unspecified: Secondary | ICD-10-CM | POA: Insufficient documentation

## 2019-12-20 DIAGNOSIS — Z170 Estrogen receptor positive status [ER+]: Secondary | ICD-10-CM | POA: Diagnosis present

## 2019-12-20 DIAGNOSIS — Z9889 Other specified postprocedural states: Secondary | ICD-10-CM | POA: Diagnosis present

## 2019-12-20 DIAGNOSIS — M8580 Other specified disorders of bone density and structure, unspecified site: Secondary | ICD-10-CM | POA: Diagnosis not present

## 2019-12-20 DIAGNOSIS — Z9221 Personal history of antineoplastic chemotherapy: Secondary | ICD-10-CM | POA: Diagnosis not present

## 2019-12-20 DIAGNOSIS — D508 Other iron deficiency anemias: Secondary | ICD-10-CM

## 2019-12-20 LAB — COMPREHENSIVE METABOLIC PANEL
ALT: 15 U/L (ref 0–44)
AST: 15 U/L (ref 15–41)
Albumin: 3.4 g/dL — ABNORMAL LOW (ref 3.5–5.0)
Alkaline Phosphatase: 80 U/L (ref 38–126)
Anion gap: 13 (ref 5–15)
BUN: 13 mg/dL (ref 8–23)
CO2: 24 mmol/L (ref 22–32)
Calcium: 9.1 mg/dL (ref 8.9–10.3)
Chloride: 100 mmol/L (ref 98–111)
Creatinine, Ser: 0.76 mg/dL (ref 0.44–1.00)
GFR, Estimated: >60 mL/min (ref >60)
Glucose, Bld: 131 mg/dL — ABNORMAL HIGH (ref 70–99)
Potassium: 4 mmol/L (ref 3.5–5.1)
Sodium: 137 mmol/L (ref 135–145)
Total Bilirubin: 0.5 mg/dL (ref 0.3–1.2)
Total Protein: 8.2 g/dL — ABNORMAL HIGH (ref 6.5–8.1)

## 2019-12-20 LAB — CBC WITH DIFFERENTIAL/PLATELET
Abs Immature Granulocytes: 0.03 10*3/uL (ref 0.00–0.07)
Basophils Absolute: 0 10*3/uL (ref 0.0–0.1)
Basophils Relative: 0 %
Eosinophils Absolute: 0.3 10*3/uL (ref 0.0–0.5)
Eosinophils Relative: 3 %
HCT: 32.1 % — ABNORMAL LOW (ref 36.0–46.0)
Hemoglobin: 9.7 g/dL — ABNORMAL LOW (ref 12.0–15.0)
Immature Granulocytes: 0 %
Lymphocytes Relative: 16 %
Lymphs Abs: 1.4 10*3/uL (ref 0.7–4.0)
MCH: 26.6 pg (ref 26.0–34.0)
MCHC: 30.2 g/dL (ref 30.0–36.0)
MCV: 87.9 fL (ref 80.0–100.0)
Monocytes Absolute: 0.5 10*3/uL (ref 0.1–1.0)
Monocytes Relative: 6 %
Neutro Abs: 6.1 10*3/uL (ref 1.7–7.7)
Neutrophils Relative %: 75 %
Platelets: 338 10*3/uL (ref 150–400)
RBC: 3.65 MIL/uL — ABNORMAL LOW (ref 3.87–5.11)
RDW: 15.9 % — ABNORMAL HIGH (ref 11.5–15.5)
WBC: 8.3 10*3/uL (ref 4.0–10.5)
nRBC: 0 % (ref 0.0–0.2)

## 2019-12-20 LAB — IRON AND TIBC
Iron: 27 ug/dL — ABNORMAL LOW (ref 28–170)
Saturation Ratios: 11 % (ref 10.4–31.8)
TIBC: 249 ug/dL — ABNORMAL LOW (ref 250–450)
UIBC: 222 ug/dL

## 2019-12-20 LAB — LACTATE DEHYDROGENASE: LDH: 114 U/L (ref 98–192)

## 2019-12-20 LAB — VITAMIN B12: Vitamin B-12: 676 pg/mL (ref 180–914)

## 2019-12-20 LAB — FERRITIN: Ferritin: 729 ng/mL — ABNORMAL HIGH (ref 11–307)

## 2019-12-20 LAB — VITAMIN D 25 HYDROXY (VIT D DEFICIENCY, FRACTURES): Vit D, 25-Hydroxy: 38.17 ng/mL (ref 30–100)

## 2019-12-20 LAB — FOLATE: Folate: 6.6 ng/mL (ref >5.9)

## 2019-12-26 ENCOUNTER — Other Ambulatory Visit: Payer: Self-pay

## 2019-12-26 ENCOUNTER — Encounter (HOSPITAL_COMMUNITY): Payer: Self-pay | Admitting: Hematology

## 2019-12-26 ENCOUNTER — Inpatient Hospital Stay (HOSPITAL_BASED_OUTPATIENT_CLINIC_OR_DEPARTMENT_OTHER): Payer: Medicare Other | Admitting: Hematology

## 2019-12-26 VITALS — BP 128/56 | HR 78 | Temp 97.2°F | Resp 18 | Wt 271.0 lb

## 2019-12-26 DIAGNOSIS — Z170 Estrogen receptor positive status [ER+]: Secondary | ICD-10-CM

## 2019-12-26 DIAGNOSIS — C50412 Malignant neoplasm of upper-outer quadrant of left female breast: Secondary | ICD-10-CM | POA: Diagnosis not present

## 2019-12-26 DIAGNOSIS — M85852 Other specified disorders of bone density and structure, left thigh: Secondary | ICD-10-CM | POA: Diagnosis not present

## 2019-12-26 NOTE — Progress Notes (Signed)
[facility] [address] [place] [address]   Patient Care Team: Bridget Hartshorn, NP as PCP - General (Adult Health Nurse Practitioner) Druscilla Brownie, MD as Consulting Physician (Dermatology) Minus Breeding, MD as Consulting Physician (Cardiology) Larey Dresser, MD as Consulting Physician (Cardiology) Kyung Rudd, MD as Consulting Physician (Radiation Oncology) Alphonsa Overall, MD as Consulting Physician (General Surgery) Patrici Ranks, MD (Inactive) as Consulting Physician (Hematology and Oncology)  SUMMARY OF ONCOLOGIC HISTORY: Oncology History  Breast cancer of upper-outer quadrant of left female breast ([place])  11/01/2014 Mammogram   Possible mass in the left breast upper outer quadrant measuring 13 mm suspicious for breast cancer confirmed through ultrasound a spiculated hypoechoic mass ill-defined, no enlarged lymph nodes   11/01/2014 Initial Diagnosis   Invasive ductal carcinoma, moderately differentiated, 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 The estimated ejection fraction was in the range of 55% to 60%.    12/25/2014 Surgery   Left lumpectomy: IDC 1.7 cm, positive for LVI, with DCIS, 1/1 sentinel node positive deposit 1.9 cm with extracapsular extension, ER 90%, PR 90%, HER-2 positive ratio 2.4, Ki-67 29% T1 cN1 stage II a   02/01/2015 - 04/19/2015 Chemotherapy   Abraxane/Herceptin   03/20/2015 Echocardiogram   Systolic function wasnormal. The estimated ejection fraction was in the range of 60%to 65%.   05/08/2015 - 06/22/2015 Radiation Therapy   Dr. Lisbeth Renshaw    05/10/2015 - 02/07/2016 Antibody Plan   Herceptin every 21 days to complete 52 weeks worth of therapy.   06/13/2015 Echocardiogram   2D echo- The estimated ejection fraction was in the range of 60% to 65%. Diastolic function is abnormal, indeterminate grade. Wallmotion  was normal.   06/28/2015 Imaging   Bone density- BMD as determined from Femur Total Left is 0.994 g/cm2 with a T-Score of -0.1. This patient is considered normal according to [facility]) criteria.    07/13/2015 -  Anti-estrogen oral therapy   Arimidex daily   09/12/2015 Echocardiogram   The estimated ejection fraction was in the range of 60% to 65%. Wall motion was normal; there were no regional wall motion abnormalities. Doppler parameters are consistent with abnormal L ventricular relaxation (grade 1 diastolic dysfunction).   12/04/2015 Echocardiogram   Left ventricle: The cavity size was normal. Wall thickness was   increased increased in a pattern of mild to moderate LVH.   Systolic function was normal. The estimated ejection fraction was   in the range of 60% to 65%. Wall motion was normal; there were no   regional wall motion abnormalities. Features are consistent with   a pseudonormal left ventricular filling pattern, with concomitant   abnormal relaxation and increased filling pressure (grade 2   diastolic dysfunction). Doppler parameters are consistent with   high ventricular filling pressure.   03/21/2016 Procedure   Port removed by Dr. Lucia Gaskins     CHIEF COMPLIANT: Follow-up for left breast cancer   INTERVAL HISTORY: Ms. RAYN SHORB is a 74 y.o. female here today for follow up of her left breast cancer. Her last visit was on 09/21/2018.   Today she is accompanied by her daughter. She is tolerating the Arimidex well and is having hot flashes, but denies having any new aches or pains. She has hidradenitis in her bilateral armpits, buttocks and waistline which bleed sometimes. She reports  seeing red and pink spots on her right lower leg and she supposes that it is due to her sleeping with her legs crossed.   REVIEW OF SYSTEMS:   Review of Systems  Constitutional: Positive for fatigue (depleted). Negative for appetite change.  Respiratory: Positive for  shortness of breath (w/ exertion).   Endocrine: Positive for hot flashes.  Genitourinary: Positive for bladder incontinence (leakage).   Musculoskeletal: Positive for back pain (8/10 back and legs pain).  All other systems reviewed and are negative.   I have reviewed the past medical history, past surgical history, social history and family history with the patient and they are unchanged from previous note.   ALLERGIES:   is allergic to lisinopril and penicillins.   MEDICATIONS:  Current Outpatient Medications  Medication Sig Dispense Refill  . ascorbic acid (VITAMIN C) 500 MG tablet Take 1 tablet by mouth daily.    . Black Elderberry,Berry-Flower, 575 MG CAPS Take 1 capsule by mouth daily.    Marland Kitchen sulfamethoxazole-trimethoprim (BACTRIM DS) 800-160 MG tablet Take 1 tablet by mouth 2 (two) times daily.    Marland Kitchen acetaminophen (TYLENOL) 500 MG tablet Take 1,000 mg by mouth every 6 (six) hours as needed for mild pain or headache.    . albuterol (PROVENTIL HFA;VENTOLIN HFA) 108 (90 BASE) MCG/ACT inhaler Inhale 2 puffs into the lungs every 4 (four) hours as needed for shortness of breath.    . anastrozole (ARIMIDEX) 1 MG tablet Take 1 tablet (1 mg total) by mouth daily. 90 tablet 3  . atorvastatin (LIPITOR) 20 MG tablet Take 20 mg by mouth every morning.     . Blood Glucose Monitoring Suppl (BLOOD GLUCOSE MONITOR SYSTEM) w/Device KIT With lancets and strips #100 6rf Dx: E11.9 Test bid    . Calcium 150 MG TABS Take 1 tablet by mouth daily.     . cholecalciferol (VITAMIN D) 1000 units tablet Take 2,000 Units by mouth daily.    . ferrous sulfate 325 (65 FE) MG tablet Take 325 mg by mouth 3 (three) times daily with meals.    . fluconazole (DIFLUCAN) 150 MG tablet Take 150 mg by mouth as needed.     . furosemide (LASIX) 20 MG tablet Take 20 mg by mouth 3 (three) times a week.    Marland Kitchen glipiZIDE (GLUCOTROL XL) 10 MG 24 hr tablet Take 10 mg by mouth daily with breakfast.     . losartan (COZAAR) 100 MG tablet  Take 100 mg by mouth daily.    . metFORMIN (GLUCOPHAGE-XR) 500 MG 24 hr tablet TAKE ONE TABLET BY MOUTH TWICE DAILY    . metoprolol succinate (TOPROL-XL) 100 MG 24 hr tablet TAKE ONE (1) TABLET EACH DAY    . Misc. Devices MISC Underpad's 6 Bags/15-90 per month  Incontinence Pads 3 Bags/20-60 per month  2 Box of Gloves per month  40 box of gauze  20 rolls of tape  Nutrition supplement/30 day supply    . mupirocin ointment (BACTROBAN) 2 % SMARTSIG:1 Application Topical 2-3 Times Daily    . pantoprazole (PROTONIX) 40 MG tablet TAKE 1 TABLET (40 MG TOTAL) BY MOUTH DAILY. (Patient taking differently: Take 40 mg by mouth as needed. ) 90 tablet 0  . polyethylene glycol (MIRALAX / GLYCOLAX) packet Take 17 g by mouth daily as needed for mild constipation. 14 each 0  . sodium chloride irrigation 0.9 % irrigation USE AS DIRECTED    . verapamil (CALAN-SR) 120 MG CR tablet Take 120 mg by mouth daily.  No current facility-administered medications for this visit.     PHYSICAL EXAMINATION: Performance status (ECOG): 1 - Symptomatic but completely ambulatory  Vitals:   12/26/19 1523  BP: (!) 128/56  Pulse: 78  Resp: 18  Temp: (!) 97.2 F (36.2 C)  SpO2: 99%   Wt Readings from Last 3 Encounters:  12/26/19 271 lb (122.9 kg)  07/24/19 280 lb (127 kg)  01/24/19 283 lb (128.4 kg)   Physical Exam Vitals reviewed.  Constitutional:      Appearance: Normal appearance. She is obese.     Comments: Wheelchair or walker  Cardiovascular:     Rate and Rhythm: Normal rate and regular rhythm.     Pulses: Normal pulses.     Heart sounds: Normal heart sounds.  Pulmonary:     Effort: Pulmonary effort is normal.     Breath sounds: Normal breath sounds.  Chest:    Neurological:     General: No focal deficit present.     Mental Status: She is alert and oriented to person, place, and time.  Psychiatric:        Mood and Affect: Mood normal.        Behavior: Behavior normal.     Breast Exam  Chaperone: Milinda Antis, MD     LABORATORY DATA:  I have reviewed the data as listed CMP Latest Ref Rng & Units 12/20/2019 09/26/2019 07/19/2019  Glucose 70 - 99 mg/dL 131(H) 153(H) 117(H)  BUN 8 - 23 mg/dL $Remove'13 18 18  'pIWLqUa$ Creatinine 0.44 - 1.00 mg/dL 0.76 0.75 0.74  Sodium 135 - 145 mmol/L 137 138 141  Potassium 3.5 - 5.1 mmol/L 4.0 3.7 3.8  Chloride 98 - 111 mmol/L 100 99 100  CO2 22 - 32 mmol/L $RemoveB'24 27 28  'BhIygcEb$ Calcium 8.9 - 10.3 mg/dL 9.1 9.2 9.6  Total Protein 6.5 - 8.1 g/dL 8.2(H) 7.6 7.9  Total Bilirubin 0.3 - 1.2 mg/dL 0.5 0.5 0.6  Alkaline Phos 38 - 126 U/L 80 78 85  AST 15 - 41 U/L 15 11(L) 14(L)  ALT 0 - 44 U/L $Remo'15 13 14   'hytvb$ No results found for: [id number] Lab Results  Component Value Date   WBC 8.3 12/20/2019   HGB 9.7 (L) 12/20/2019   HCT 32.1 (L) 12/20/2019   MCV 87.9 12/20/2019   PLT 338 12/20/2019   NEUTROABS 6.1 12/20/2019   Lab Results  Component Value Date   VD25OH 38.17 12/20/2019   VD25OH 29.01 (L) 07/19/2019   VD25OH 37.1 10/25/2015   Lab Results  Component Value Date   LDH 114 12/20/2019   LDH 122 07/19/2019   LDH 120 09/21/2018   Lab Results  Component Value Date   TIBC 249 (L) 12/20/2019   TIBC 261 07/19/2019   TIBC 288 01/24/2019   FERRITIN 729 (H) 12/20/2019   FERRITIN 413 (H) 07/19/2019   FERRITIN 224 01/24/2019   IRONPCTSAT 11 12/20/2019   IRONPCTSAT 11 07/19/2019   IRONPCTSAT 13 01/24/2019    ASSESSMENT:  1. 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  She then underwent XRT by Dr. Lisbeth Renshaw (05/08/2015- 06/22/2015) with continuation of Herceptin x 52 weeks finishing on 02/07/2016. -Anastrozole started in May 2017, tolerating it very well.  Hot flashes minor or stable. -Mammogram on December 20, 2019, BI-RADS category  2.  Osteopenia: - DEXA  scan on 06/12/2017 shows  T score of -2.3 in the femoral neck.  Prior to that DEXA scan 2017 showed T score of -0.1. -Prolia started on September 18, 2017. -DEXA scan on December 20, 2019 with T score of -0.9.  3.  Normocytic anemia: -She is taking iron supplements.   PLAN:  1. 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 -Physical exam today did not reveal any palpable masses.  Lumpectomy scar is within normal limits.  Reviewed mammogram results which were normal.  Also reviewed chemistry results which were grossly normal. -RTC 6 months with labs.  2.  Osteopenia: -Reviewed DEXA scan results which showed improvement on Prolia. -Continue Prolia as long as she is on anastrozole.  3.  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  She has hidradenitis flareups for several years. -She is seeing a dermatology who is planning to start her on Humira.   Breast Cancer therapy associated bone loss: I have recommended calcium, Vitamin D and weight bearing exercises.  No orders of the defined types were placed in this encounter.  The patient has a good understanding of the overall plan. she agrees with it. she will call with any problems that may develop before the next visit here.    Derek Jack, MD [place] [facility] [phone]   I, Milinda Antis, am acting as a scribe for Dr. Sanda Linger.  I, Derek Jack MD, have reviewed the above documentation for accuracy and completeness, and I agree with the above.

## 2019-12-26 NOTE — Patient Instructions (Signed)
[place] at [facility] Discharge Instructions  You were seen today by Dr. Delton Coombes. He went over your recent results. Your next appointment will be with the nurse practitioner in 6 months for labs and follow up.   Thank you for choosing [place] at [facility] to provide your oncology and hematology care.  To afford each patient quality time with our provider, please arrive at least 15 minutes before your scheduled appointment time.   If you have a lab appointment with the [facility] please come in thru the Main Entrance and check in at the main information desk  You need to re-schedule your appointment should you arrive 10 or more minutes late.  We strive to give you quality time with our providers, and arriving late affects you and other patients whose appointments are after yours.  Also, if you no show three or more times for appointments you may be dismissed from the clinic at the providers discretion.     Again, thank you for choosing [facility].  Our hope is that these requests will decrease the amount of time that you wait before being seen by our physicians.       _____________________________________________________________  Should you have questions after your visit to [facility], please contact our office at [phone] between the hours of 8:00 a.m. and 4:30 p.m.  Voicemails left after 4:00 p.m. will not be returned until the following business day.  For prescription refill requests, have your pharmacy contact our office and allow 72 hours.    Cancer Center Support Programs:   > Cancer Support Group  2nd Tuesday of the month 1pm-2pm, Journey Room

## 2019-12-27 ENCOUNTER — Ambulatory Visit (HOSPITAL_COMMUNITY): Payer: Medicare Other | Admitting: Nurse Practitioner

## 2020-03-27 ENCOUNTER — Other Ambulatory Visit (HOSPITAL_COMMUNITY): Payer: Self-pay | Admitting: *Deleted

## 2020-03-27 DIAGNOSIS — C50412 Malignant neoplasm of upper-outer quadrant of left female breast: Secondary | ICD-10-CM

## 2020-03-27 DIAGNOSIS — Z170 Estrogen receptor positive status [ER+]: Secondary | ICD-10-CM

## 2020-03-28 ENCOUNTER — Inpatient Hospital Stay (HOSPITAL_COMMUNITY): Payer: Medicare Other

## 2020-03-28 ENCOUNTER — Encounter (HOSPITAL_COMMUNITY): Payer: Self-pay

## 2020-03-28 ENCOUNTER — Other Ambulatory Visit: Payer: Self-pay

## 2020-03-28 ENCOUNTER — Inpatient Hospital Stay (HOSPITAL_COMMUNITY): Payer: Medicare Other | Attending: Hematology

## 2020-03-28 VITALS — BP 147/65 | HR 70 | Temp 97.1°F | Resp 18

## 2020-03-28 DIAGNOSIS — M8580 Other specified disorders of bone density and structure, unspecified site: Secondary | ICD-10-CM | POA: Insufficient documentation

## 2020-03-28 DIAGNOSIS — C50412 Malignant neoplasm of upper-outer quadrant of left female breast: Secondary | ICD-10-CM

## 2020-03-28 DIAGNOSIS — Z170 Estrogen receptor positive status [ER+]: Secondary | ICD-10-CM | POA: Insufficient documentation

## 2020-03-28 DIAGNOSIS — C50912 Malignant neoplasm of unspecified site of left female breast: Secondary | ICD-10-CM | POA: Diagnosis present

## 2020-03-28 DIAGNOSIS — M85852 Other specified disorders of bone density and structure, left thigh: Secondary | ICD-10-CM

## 2020-03-28 LAB — CBC WITH DIFFERENTIAL/PLATELET
Abs Immature Granulocytes: 0.03 10*3/uL (ref 0.00–0.07)
Basophils Absolute: 0 10*3/uL (ref 0.0–0.1)
Basophils Relative: 0 %
Eosinophils Absolute: 0.2 10*3/uL (ref 0.0–0.5)
Eosinophils Relative: 3 %
HCT: 34.8 % — ABNORMAL LOW (ref 36.0–46.0)
Hemoglobin: 10.3 g/dL — ABNORMAL LOW (ref 12.0–15.0)
Immature Granulocytes: 1 %
Lymphocytes Relative: 26 %
Lymphs Abs: 1.7 10*3/uL (ref 0.7–4.0)
MCH: 27.3 pg (ref 26.0–34.0)
MCHC: 29.6 g/dL — ABNORMAL LOW (ref 30.0–36.0)
MCV: 92.3 fL (ref 80.0–100.0)
Monocytes Absolute: 0.3 10*3/uL (ref 0.1–1.0)
Monocytes Relative: 5 %
Neutro Abs: 4.2 10*3/uL (ref 1.7–7.7)
Neutrophils Relative %: 65 %
Platelets: 245 10*3/uL (ref 150–400)
RBC: 3.77 MIL/uL — ABNORMAL LOW (ref 3.87–5.11)
RDW: 14.7 % (ref 11.5–15.5)
WBC: 6.5 10*3/uL (ref 4.0–10.5)
nRBC: 0 % (ref 0.0–0.2)

## 2020-03-28 LAB — COMPREHENSIVE METABOLIC PANEL
ALT: 14 U/L (ref 0–44)
AST: 13 U/L — ABNORMAL LOW (ref 15–41)
Albumin: 3.4 g/dL — ABNORMAL LOW (ref 3.5–5.0)
Alkaline Phosphatase: 87 U/L (ref 38–126)
Anion gap: 12 (ref 5–15)
BUN: 14 mg/dL (ref 8–23)
CO2: 28 mmol/L (ref 22–32)
Calcium: 9.4 mg/dL (ref 8.9–10.3)
Chloride: 98 mmol/L (ref 98–111)
Creatinine, Ser: 0.79 mg/dL (ref 0.44–1.00)
GFR, Estimated: >60 mL/min (ref >60)
Glucose, Bld: 151 mg/dL — ABNORMAL HIGH (ref 70–99)
Potassium: 4.1 mmol/L (ref 3.5–5.1)
Sodium: 138 mmol/L (ref 135–145)
Total Bilirubin: 0.5 mg/dL (ref 0.3–1.2)
Total Protein: 7.8 g/dL (ref 6.5–8.1)

## 2020-03-28 MED ORDER — ANASTROZOLE 1 MG PO TABS: 1.0000 mg | ORAL_TABLET | Freq: Every day | ORAL | 3 refills | Status: DC

## 2020-03-28 MED ORDER — DENOSUMAB 60 MG/ML ~~LOC~~ SOSY
60.0000 mg | PREFILLED_SYRINGE | Freq: Once | SUBCUTANEOUS | Status: AC
  Administered 2020-03-28: 60 mg via SUBCUTANEOUS
  Filled 2020-03-28: qty 1

## 2020-03-28 NOTE — Patient Instructions (Signed)
[facility] at [facility] Discharge Instructions  Received Prolia injection today. Follow-up as scheduled   Thank you for choosing [facility] at [facility] to provide your oncology and hematology care.  To afford each patient quality time with our provider, please arrive at least 15 minutes before your scheduled appointment time.   If you have a lab appointment with the Cancer Center please come in thru the Main Entrance and check in at the main information desk.  You need to re-schedule your appointment should you arrive 10 or more minutes late.  We strive to give you quality time with our providers, and arriving late affects you and other patients whose appointments are after yours.  Also, if you no show three or more times for appointments you may be dismissed from the clinic at the providers discretion.     Again, thank you for choosing [facility].  Our hope is that these requests will decrease the amount of time that you wait before being seen by our physicians.       _____________________________________________________________  Should you have questions after your visit to  Cancer Center, please contact our office at [phone] and follow the prompts.  Our office hours are 8:00 a.m. and 4:30 p.m. Monday - Friday.  Please note that voicemails left after 4:00 p.m. may not be returned until the following business day.  We are closed weekends and major holidays.  You do have access to a nurse 24-7, just call the main number to the clinic [phone] and do not press any options, hold on the line and a nurse will answer the phone.    For prescription refill requests, have your pharmacy contact our office and allow 72 hours.    Due to Covid, you will need to wear a mask upon entering the hospital. If you do not have a mask, a mask will be given to you at the Main Entrance upon arrival. For doctor visits, patients may have  1 support person age 18 or older with them. For treatment visits, patients can not have anyone with them due to social distancing guidelines and our immunocompromised population.     

## 2020-03-28 NOTE — Progress Notes (Signed)
Kristy Harris tolerated Prolia injection well without complaints or incident. Calcium 9.4 today and pt denied any tooth or jaw pain and no recent or future dental visits prior to administering this medication.Pt continues to take her Calcium PO as prescribed without issues. VSS Pt discharged via wheelchair in satisfactory condition

## 2020-07-09 ENCOUNTER — Inpatient Hospital Stay (HOSPITAL_COMMUNITY): Payer: Medicare Other

## 2020-07-16 ENCOUNTER — Ambulatory Visit (HOSPITAL_COMMUNITY): Payer: Medicare Other | Admitting: Hematology

## 2020-07-16 ENCOUNTER — Inpatient Hospital Stay (HOSPITAL_COMMUNITY): Payer: Medicare Other | Attending: Hematology

## 2020-07-16 ENCOUNTER — Other Ambulatory Visit: Payer: Self-pay

## 2020-07-16 DIAGNOSIS — M8580 Other specified disorders of bone density and structure, unspecified site: Secondary | ICD-10-CM | POA: Insufficient documentation

## 2020-07-16 DIAGNOSIS — Z79899 Other long term (current) drug therapy: Secondary | ICD-10-CM | POA: Insufficient documentation

## 2020-07-16 DIAGNOSIS — Z170 Estrogen receptor positive status [ER+]: Secondary | ICD-10-CM | POA: Diagnosis not present

## 2020-07-16 DIAGNOSIS — L732 Hidradenitis suppurativa: Secondary | ICD-10-CM | POA: Diagnosis not present

## 2020-07-16 DIAGNOSIS — M85852 Other specified disorders of bone density and structure, left thigh: Secondary | ICD-10-CM

## 2020-07-16 DIAGNOSIS — D649 Anemia, unspecified: Secondary | ICD-10-CM | POA: Insufficient documentation

## 2020-07-16 DIAGNOSIS — C50412 Malignant neoplasm of upper-outer quadrant of left female breast: Secondary | ICD-10-CM | POA: Diagnosis present

## 2020-07-16 LAB — CBC WITH DIFFERENTIAL/PLATELET
Abs Immature Granulocytes: 0.03 10*3/uL (ref 0.00–0.07)
Basophils Absolute: 0 10*3/uL (ref 0.0–0.1)
Basophils Relative: 0 %
Eosinophils Absolute: 0.3 10*3/uL (ref 0.0–0.5)
Eosinophils Relative: 3 %
HCT: 35 % — ABNORMAL LOW (ref 36.0–46.0)
Hemoglobin: 10.5 g/dL — ABNORMAL LOW (ref 12.0–15.0)
Immature Granulocytes: 0 %
Lymphocytes Relative: 23 %
Lymphs Abs: 1.9 10*3/uL (ref 0.7–4.0)
MCH: 26.9 pg (ref 26.0–34.0)
MCHC: 30 g/dL (ref 30.0–36.0)
MCV: 89.7 fL (ref 80.0–100.0)
Monocytes Absolute: 0.4 10*3/uL (ref 0.1–1.0)
Monocytes Relative: 5 %
Neutro Abs: 5.5 10*3/uL (ref 1.7–7.7)
Neutrophils Relative %: 69 %
Platelets: 350 10*3/uL (ref 150–400)
RBC: 3.9 MIL/uL (ref 3.87–5.11)
RDW: 14.8 % (ref 11.5–15.5)
WBC: 8.1 10*3/uL (ref 4.0–10.5)
nRBC: 0 % (ref 0.0–0.2)

## 2020-07-16 LAB — COMPREHENSIVE METABOLIC PANEL
ALT: 14 U/L (ref 0–44)
AST: 12 U/L — ABNORMAL LOW (ref 15–41)
Albumin: 3.4 g/dL — ABNORMAL LOW (ref 3.5–5.0)
Alkaline Phosphatase: 86 U/L (ref 38–126)
Anion gap: 9 (ref 5–15)
BUN: 28 mg/dL — ABNORMAL HIGH (ref 8–23)
CO2: 25 mmol/L (ref 22–32)
Calcium: 9.7 mg/dL (ref 8.9–10.3)
Chloride: 101 mmol/L (ref 98–111)
Creatinine, Ser: 1.25 mg/dL — ABNORMAL HIGH (ref 0.44–1.00)
GFR, Estimated: 46 mL/min — ABNORMAL LOW (ref >60)
Glucose, Bld: 108 mg/dL — ABNORMAL HIGH (ref 70–99)
Potassium: 5 mmol/L (ref 3.5–5.1)
Sodium: 135 mmol/L (ref 135–145)
Total Bilirubin: 0.4 mg/dL (ref 0.3–1.2)
Total Protein: 8.5 g/dL — ABNORMAL HIGH (ref 6.5–8.1)

## 2020-07-16 LAB — FERRITIN: Ferritin: 622 ng/mL — ABNORMAL HIGH (ref 11–307)

## 2020-07-16 LAB — IRON AND TIBC
Iron: 51 ug/dL (ref 28–170)
Saturation Ratios: 20 % (ref 10.4–31.8)
TIBC: 261 ug/dL (ref 250–450)
UIBC: 210 ug/dL

## 2020-07-20 ENCOUNTER — Encounter (HOSPITAL_COMMUNITY): Payer: Self-pay | Admitting: Hematology

## 2020-07-20 ENCOUNTER — Other Ambulatory Visit: Payer: Self-pay

## 2020-07-20 ENCOUNTER — Inpatient Hospital Stay (HOSPITAL_BASED_OUTPATIENT_CLINIC_OR_DEPARTMENT_OTHER): Payer: Medicare Other | Admitting: Hematology

## 2020-07-20 VITALS — HR 85 | Temp 97.1°F | Resp 18 | Wt 274.0 lb

## 2020-07-20 DIAGNOSIS — D513 Other dietary vitamin B12 deficiency anemia: Secondary | ICD-10-CM | POA: Diagnosis not present

## 2020-07-20 DIAGNOSIS — E559 Vitamin D deficiency, unspecified: Secondary | ICD-10-CM | POA: Diagnosis not present

## 2020-07-20 DIAGNOSIS — C50412 Malignant neoplasm of upper-outer quadrant of left female breast: Secondary | ICD-10-CM

## 2020-07-20 DIAGNOSIS — D508 Other iron deficiency anemias: Secondary | ICD-10-CM | POA: Diagnosis not present

## 2020-07-20 NOTE — Progress Notes (Signed)
 [facility] [address] [place], [place] [address]   Patient Care Team: Hemberg, Katherine V, NP as PCP - General (Adult Health Nurse Practitioner) Lupton, Frederick, MD as Consulting Physician (Dermatology) Hochrein, James, MD as Consulting Physician (Cardiology) McLean, Dalton S, MD as Consulting Physician (Cardiology) Moody, John, MD as Consulting Physician (Radiation Oncology) Newman, David, MD as Consulting Physician (General Surgery) Penland, Shannon K, MD (Inactive) as Consulting Physician (Hematology and Oncology)  SUMMARY OF ONCOLOGIC HISTORY: Oncology History  Breast cancer of upper-outer quadrant of left female breast (HCC)  11/01/2014 Mammogram   Possible mass in the left breast upper outer quadrant measuring 13 mm suspicious for breast cancer confirmed through ultrasound a spiculated hypoechoic mass ill-defined, no enlarged lymph nodes   11/01/2014 Initial Diagnosis   Invasive ductal carcinoma, moderately differentiated, 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 The estimated ejection fraction was in the range of 55% to 60%.    12/25/2014 Surgery   Left lumpectomy: IDC 1.7 cm, positive for LVI, with DCIS, 1/1 sentinel node positive deposit 1.9 cm with extracapsular extension, ER 90%, PR 90%, HER-2 positive ratio 2.4, Ki-67 29% T1 cN1 stage II a   02/01/2015 - 04/19/2015 Chemotherapy   Abraxane/Herceptin   03/20/2015 Echocardiogram   Systolic function wasnormal. The estimated ejection fraction was in the range of 60%to 65%.   05/08/2015 - 06/22/2015 Radiation Therapy   Dr. Moody    05/10/2015 - 02/07/2016 Antibody Plan   Herceptin every 21 days to complete 52 weeks worth of therapy.   06/13/2015 Echocardiogram   2D echo- The estimated ejection fraction was in the range of 60% to 65%. Diastolic function is abnormal, indeterminate grade. Wallmotion  was normal.   06/28/2015 Imaging   Bone density- BMD as determined from Femur Total Left is 0.994 g/cm2 with a T-Score of -0.1. This patient is considered normal according to World Health Organization (WHO) criteria.    07/13/2015 -  Anti-estrogen oral therapy   Arimidex daily   09/12/2015 Echocardiogram   The estimated ejection fraction was in the range of 60% to 65%. Wall motion was normal; there were no regional wall motion abnormalities. Doppler parameters are consistent with abnormal L ventricular relaxation (grade 1 diastolic dysfunction).   12/04/2015 Echocardiogram   Left ventricle: The cavity size was normal. Wall thickness was   increased increased in a pattern of mild to moderate LVH.   Systolic function was normal. The estimated ejection fraction was   in the range of 60% to 65%. Wall motion was normal; there were no   regional wall motion abnormalities. Features are consistent with   a pseudonormal left ventricular filling pattern, with concomitant   abnormal relaxation and increased filling pressure (grade 2   diastolic dysfunction). Doppler parameters are consistent with   high ventricular filling pressure.   03/21/2016 Procedure   Port removed by Dr. Newman     CHIEF COMPLIANT: Follow-up for left breast cancer   INTERVAL HISTORY: Kristy [facility] is a 75 y.o. female seen for follow-up of left breast cancer.  She is tolerating anastrozole reasonably well.  Chronic pains in the back and legs have been stable.  She reportedly had cellulitis of the right hand for which she is currently receiving Bactrim since 06/29/2020.  Prior to that she received doxycycline.   REVIEW OF SYSTEMS:   Review of   Systems  Cardiovascular: Positive for leg swelling.  All other systems reviewed and are negative.   I have reviewed the past medical history, past surgical history, social history and family history with the patient and they are unchanged from previous note.   ALLERGIES:   is  allergic to lisinopril and penicillins.   MEDICATIONS:  Current Outpatient Medications  Medication Sig Dispense Refill  . acetaminophen (TYLENOL) 500 MG tablet Take 1,000 mg by mouth every 6 (six) hours as needed for mild pain or headache.    . albuterol (PROVENTIL HFA;VENTOLIN HFA) 108 (90 BASE) MCG/ACT inhaler Inhale 2 puffs into the lungs every 4 (four) hours as needed for shortness of breath.    . anastrozole (ARIMIDEX) 1 MG tablet Take 1 tablet (1 mg total) by mouth daily. 90 tablet 3  . ascorbic acid (VITAMIN C) 500 MG tablet Take 1 tablet by mouth daily.    Marland Kitchen atorvastatin (LIPITOR) 20 MG tablet Take 20 mg by mouth every morning.    . Black Elderberry,Berry-Flower, 575 MG CAPS Take 1 capsule by mouth daily.    . Blood Glucose Monitoring Suppl (BLOOD GLUCOSE MONITOR SYSTEM) w/Device KIT With lancets and strips #100 6rf Dx: E11.9 Test bid    . Calcium 150 MG TABS Take 1 tablet by mouth daily.     . cholecalciferol (VITAMIN D) 1000 units tablet Take 2,000 Units by mouth daily.    . ferrous sulfate 325 (65 FE) MG tablet Take 325 mg by mouth 3 (three) times daily with meals.    . fluconazole (DIFLUCAN) 150 MG tablet Take 150 mg by mouth as needed.     . furosemide (LASIX) 20 MG tablet Take 20 mg by mouth 3 (three) times a week.    Marland Kitchen glipiZIDE (GLUCOTROL XL) 10 MG 24 hr tablet Take 10 mg by mouth daily with breakfast.     . losartan (COZAAR) 100 MG tablet Take 100 mg by mouth daily.    . metFORMIN (GLUCOPHAGE-XR) 500 MG 24 hr tablet TAKE ONE TABLET BY MOUTH TWICE DAILY    . metoprolol succinate (TOPROL-XL) 100 MG 24 hr tablet TAKE ONE (1) TABLET EACH DAY    . Misc. Devices MISC Underpad's 6 Bags/15-90 per month  Incontinence Pads 3 Bags/20-60 per month  2 Box of Gloves per month  40 box of gauze  20 rolls of tape  Nutrition supplement/30 day supply    . mupirocin ointment (BACTROBAN) 2 % SMARTSIG:1 Application Topical 2-3 Times Daily    . pantoprazole (PROTONIX) 40 MG tablet TAKE 1  TABLET (40 MG TOTAL) BY MOUTH DAILY. (Patient taking differently: Take 40 mg by mouth as needed.) 90 tablet 0  . polyethylene glycol (MIRALAX / GLYCOLAX) packet Take 17 g by mouth daily as needed for mild constipation. 14 each 0  . predniSONE (DELTASONE) 20 MG tablet Take 20 mg by mouth daily with breakfast. As directed by PCP.    Marland Kitchen sodium chloride irrigation 0.9 % irrigation USE AS DIRECTED    . sulfamethoxazole-trimethoprim (BACTRIM DS) 800-160 MG tablet Take 1 tablet by mouth 2 (two) times daily.    . verapamil (CALAN-SR) 120 MG CR tablet Take 120 mg by mouth daily.     No current facility-administered medications for this visit.     PHYSICAL EXAMINATION: Performance status (ECOG): 1 - Symptomatic but completely ambulatory  Vitals:   07/20/20 1133  Pulse: 85  Resp: 18  Temp: (!) 97.1 F (36.2 C)  SpO2: 97%   Wt Readings from  Last 3 Encounters:  07/20/20 274 lb (124.3 kg)  12/26/19 271 lb (122.9 kg)  07/24/19 280 lb (127 kg)   Physical Exam Vitals reviewed.  Constitutional:      Appearance: Normal appearance. She is obese.     Comments: Wheelchair or walker  Cardiovascular:     Rate and Rhythm: Normal rate and regular rhythm.     Pulses: Normal pulses.     Heart sounds: Normal heart sounds.  Pulmonary:     Effort: Pulmonary effort is normal.     Breath sounds: Normal breath sounds.  Chest:    Neurological:     Mental Status: She is alert and oriented to person, place, and time.  Psychiatric:        Mood and Affect: Mood normal.        Behavior: Behavior normal.        LABORATORY DATA:  I have reviewed the data as listed CMP Latest Ref Rng & Units 07/16/2020 03/28/2020 12/20/2019  Glucose 70 - 99 mg/dL 108(H) 151(H) 131(H)  BUN 8 - 23 mg/dL 28(H) 14 13  Creatinine 0.44 - 1.00 mg/dL 1.25(H) 0.79 0.76  Sodium 135 - 145 mmol/L 135 138 137  Potassium 3.5 - 5.1 mmol/L 5.0 4.1 4.0  Chloride 98 - 111 mmol/L 101 98 100  CO2 22 - 32 mmol/L _0 Calcium 8.9 -  10.3 mg/dL 9.7 9.4 9.1  Total Protein 6.5 - 8.1 g/dL 8.5(H) 7.8 8.2(H)  Total Bilirubin 0.3 - 1.2 mg/dL 0.4 0.5 0.5  Alkaline Phos 38 - 126 U/L 86 87 80  AST 15 - 41 U/L 12(L) 13(L) 15  ALT 0 - 44 U/L _1 No results found for: JSE831 Lab Results  Component Value Date   WBC 8.1 07/16/2020   HGB 10.5 (L) 07/16/2020   HCT 35.0 (L) 07/16/2020   MCV 89.7 07/16/2020   PLT 350 07/16/2020   NEUTROABS 5.5 07/16/2020   Lab Results  Component Value Date   VD25OH 38.17 12/20/2019   VD25OH 29.01 (L) 07/19/2019   VD25OH 37.1 10/25/2015   Lab Results  Component Value Date   LDH 114 12/20/2019   LDH 122 07/19/2019   LDH 120 09/21/2018   Lab Results  Component Value Date   TIBC 261 07/16/2020   TIBC 249 (L) 12/20/2019   TIBC 261 07/19/2019   FERRITIN 622 (H) 07/16/2020   FERRITIN 729 (H) 12/20/2019   FERRITIN 413 (H) 07/19/2019   IRONPCTSAT 20 07/16/2020   IRONPCTSAT 11 12/20/2019   IRONPCTSAT 11 07/19/2019    ASSESSMENT:  1. 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  She then underwent XRT by Dr. Lisbeth Renshaw (05/08/2015- 06/22/2015) with continuation of Herceptin x 52 weeks finishing on 02/07/2016. -Anastrozole started in May 2017, tolerating it very well.  Hot flashes minor or stable. -Mammogram on December 20, 2019, BI-RADS category  2.  Osteopenia: - DEXA scan on 06/12/2017 shows T score of -2.3 in the femoral neck.  Prior to that DEXA scan 2017 showed T score of -0.1. -Prolia started on September 18, 2017. -DEXA scan on December 20, 2019 with T score of -0.9.  3.  Normocytic anemia: -She is taking iron supplements.   PLAN:  1. 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  Lumpectomy scar is within normal limits in the left breast.  No palpable adenopathy. - Reviewed labs which showed normal LFTs. - Continue anastrozole beyond 5 years up to 7 years.  She would like to extend anastrozole beyond 5 years as she is tolerating well. - We will schedule for mammogram in October. - RTC 6 months for follow-up.  2.  Osteopenia: -Continue calcium and vitamin D supplements. - Continue Prolia every 6 months.  3.  Normocytic anemia: -This is anemia from chronic inflammation from hidradenitis flareups for several years. - She is currently on antibiotics.  Proposed plan is to start her on Humira. - Labs reviewed shows hemoglobin 10.5.  Ferritin is 622 and percent saturation is 20.   Breast Cancer therapy associated bone loss: I have recommended calcium, Vitamin D and weight bearing exercises.  Orders Placed This Encounter  Procedures  . MM Digital Diagnostic Bilat    Adv needed u/s breast or incrrt kr [id number] p    Standing Status:   Future    Standing Expiration Date:   07/20/2021    Order Specific Question:   Reason for Exam (SYMPTOM  OR DIAGNOSIS REQUIRED)    Answer:   Breast cancer    Order Specific Question:   Preferred imaging location?    Answer:   [facility]    Order Specific Question:   Release to patient    Answer:   Immediate  . CBC with Differential/Platelet    Standing Status:   Future    Standing Expiration Date:   07/20/2021    Order Specific Question:   Release to patient    Answer:   Immediate  . Comprehensive metabolic panel    Standing Status:   Future    Standing Expiration Date:   07/20/2021    Order Specific Question:   Release to patient    Answer:   Immediate  . VITAMIN D 25 Hydroxy (Vit-D Deficiency, Fractures)    Standing Status:   Future    Standing Expiration Date:   07/20/2021    Order Specific Question:   Release to patient    Answer:   Immediate  . Iron and TIBC     Standing Status:   Future    Standing Expiration Date:   07/20/2021    Order Specific Question:   Release to patient    Answer:   Immediate  . Ferritin    Standing Status:   Future    Standing Expiration Date:   07/20/2021    Order Specific Question:   Release to patient    Answer:   Immediate  . Vitamin B12    Standing Status:   Future    Standing Expiration Date:   07/20/2021    Order Specific Question:   Release to patient    Answer:   Immediate   The patient has a good understanding of the overall plan. she agrees with it. she will call with any problems that may develop before the next visit here.    Derek Jack, MD [employer] [phone]

## 2020-09-17 ENCOUNTER — Other Ambulatory Visit (HOSPITAL_COMMUNITY): Payer: Self-pay | Admitting: Adult Health Nurse Practitioner

## 2020-09-17 DIAGNOSIS — Z780 Asymptomatic menopausal state: Secondary | ICD-10-CM

## 2020-09-24 ENCOUNTER — Other Ambulatory Visit (HOSPITAL_COMMUNITY): Payer: Self-pay | Admitting: Surgery

## 2020-09-24 DIAGNOSIS — E559 Vitamin D deficiency, unspecified: Secondary | ICD-10-CM

## 2020-09-24 DIAGNOSIS — C50412 Malignant neoplasm of upper-outer quadrant of left female breast: Secondary | ICD-10-CM

## 2020-09-24 DIAGNOSIS — D508 Other iron deficiency anemias: Secondary | ICD-10-CM

## 2020-09-26 ENCOUNTER — Inpatient Hospital Stay (HOSPITAL_COMMUNITY): Payer: Medicare Other | Attending: Hematology

## 2020-09-26 ENCOUNTER — Encounter (HOSPITAL_COMMUNITY): Payer: Self-pay

## 2020-09-26 ENCOUNTER — Inpatient Hospital Stay (HOSPITAL_COMMUNITY): Payer: Medicare Other

## 2020-09-26 ENCOUNTER — Other Ambulatory Visit: Payer: Self-pay

## 2020-09-26 VITALS — BP 121/43 | HR 77 | Temp 96.7°F | Resp 18

## 2020-09-26 DIAGNOSIS — M85852 Other specified disorders of bone density and structure, left thigh: Secondary | ICD-10-CM

## 2020-09-26 DIAGNOSIS — M8580 Other specified disorders of bone density and structure, unspecified site: Secondary | ICD-10-CM | POA: Diagnosis not present

## 2020-09-26 DIAGNOSIS — C50412 Malignant neoplasm of upper-outer quadrant of left female breast: Secondary | ICD-10-CM

## 2020-09-26 DIAGNOSIS — D508 Other iron deficiency anemias: Secondary | ICD-10-CM

## 2020-09-26 DIAGNOSIS — E559 Vitamin D deficiency, unspecified: Secondary | ICD-10-CM

## 2020-09-26 LAB — CBC WITH DIFFERENTIAL/PLATELET
Abs Immature Granulocytes: 0.02 10*3/uL (ref 0.00–0.07)
Basophils Absolute: 0 10*3/uL (ref 0.0–0.1)
Basophils Relative: 0 %
Eosinophils Absolute: 0.2 10*3/uL (ref 0.0–0.5)
Eosinophils Relative: 3 %
HCT: 33.1 % — ABNORMAL LOW (ref 36.0–46.0)
Hemoglobin: 10.3 g/dL — ABNORMAL LOW (ref 12.0–15.0)
Immature Granulocytes: 0 %
Lymphocytes Relative: 26 %
Lymphs Abs: 1.8 10*3/uL (ref 0.7–4.0)
MCH: 28.2 pg (ref 26.0–34.0)
MCHC: 31.1 g/dL (ref 30.0–36.0)
MCV: 90.7 fL (ref 80.0–100.0)
Monocytes Absolute: 0.4 10*3/uL (ref 0.1–1.0)
Monocytes Relative: 6 %
Neutro Abs: 4.4 10*3/uL (ref 1.7–7.7)
Neutrophils Relative %: 65 %
Platelets: 269 10*3/uL (ref 150–400)
RBC: 3.65 MIL/uL — ABNORMAL LOW (ref 3.87–5.11)
RDW: 15.9 % — ABNORMAL HIGH (ref 11.5–15.5)
WBC: 6.8 10*3/uL (ref 4.0–10.5)
nRBC: 0 % (ref 0.0–0.2)

## 2020-09-26 LAB — COMPREHENSIVE METABOLIC PANEL
ALT: 20 U/L (ref 0–44)
AST: 16 U/L (ref 15–41)
Albumin: 3.5 g/dL (ref 3.5–5.0)
Alkaline Phosphatase: 100 U/L (ref 38–126)
Anion gap: 8 (ref 5–15)
BUN: 22 mg/dL (ref 8–23)
CO2: 26 mmol/L (ref 22–32)
Calcium: 9.1 mg/dL (ref 8.9–10.3)
Chloride: 100 mmol/L (ref 98–111)
Creatinine, Ser: 1.1 mg/dL — ABNORMAL HIGH (ref 0.44–1.00)
GFR, Estimated: 53 mL/min — ABNORMAL LOW (ref >60)
Glucose, Bld: 157 mg/dL — ABNORMAL HIGH (ref 70–99)
Potassium: 4.2 mmol/L (ref 3.5–5.1)
Sodium: 134 mmol/L — ABNORMAL LOW (ref 135–145)
Total Bilirubin: 0.5 mg/dL (ref 0.3–1.2)
Total Protein: 7.6 g/dL (ref 6.5–8.1)

## 2020-09-26 MED ORDER — DENOSUMAB 60 MG/ML ~~LOC~~ SOSY
60.0000 mg | PREFILLED_SYRINGE | Freq: Once | SUBCUTANEOUS | Status: AC
  Administered 2020-09-26: 60 mg via SUBCUTANEOUS
  Filled 2020-09-26: qty 1

## 2020-09-26 NOTE — Patient Instructions (Signed)
[facility]  Discharge Instructions: Thank you for choosing [facility] to provide your oncology and hematology care.  If you have a lab appointment with the [facility], please come in thru the Main Entrance and check in at the main information desk.     We strive to give you quality time with your provider. You may need to reschedule your appointment if you arrive late (15 or more minutes).  Arriving late affects you and other patients whose appointments are after yours.  Also, if you miss three or more appointments without notifying the office, you may be dismissed from the clinic at the provider's discretion.      For prescription refill requests, have your pharmacy contact our office and allow 72 hours for refills to be completed.       To help prevent nausea and vomiting after your treatment, we encourage you to take your nausea medication as directed.     Should you have questions after your visit or need to cancel or reschedule your appointment, please contact [facility] [phone]  and follow the prompts.  Office hours are 8:00 a.m. to 4:30 p.m. Monday - Friday. Please note that voicemails left after 4:00 p.m. may not be returned until the following business day.  We are closed weekends and major holidays. You have access to a nurse at all times for urgent questions. Please call the main number to the clinic [phone] and follow the prompts.  For any non-urgent questions, you may also contact your provider using MyChart. We now offer e-Visits for anyone 24 and older to request care online for non-urgent symptoms. For details visit [email or web address].   Also download the MyChart app! Go to the app store, search "MyChart", open the app, select [facility], and log in with your MyChart username and password.  Due to Covid, a mask is required upon entering the hospital/clinic. If you do not have a mask, one will be given to you upon  arrival. For doctor visits, patients may have 1 support person aged 13 or older with them. For treatment visits, patients cannot have anyone with them due to current Covid guidelines and our immunocompromised population.

## 2020-09-26 NOTE — Progress Notes (Signed)
Kristy [facility] presents today for Prolia injection per Md orders.  administered SQ in right Upper Arm. Administration without incident. Patient tolerated well.  Vitals stable and discharged home from clinic via wheelchair. Follow up as scheduled.

## 2020-12-06 ENCOUNTER — Other Ambulatory Visit (HOSPITAL_COMMUNITY): Payer: Self-pay | Admitting: Hematology

## 2020-12-06 DIAGNOSIS — Z9889 Other specified postprocedural states: Secondary | ICD-10-CM

## 2020-12-25 ENCOUNTER — Ambulatory Visit (HOSPITAL_COMMUNITY)
Admission: RE | Admit: 2020-12-25 | Discharge: 2020-12-25 | Disposition: A | Discharge status: Home / Self Care | Payer: Medicare Other | Source: Ambulatory Visit | Attending: Hematology | Admitting: Hematology

## 2020-12-25 ENCOUNTER — Other Ambulatory Visit: Payer: Self-pay

## 2020-12-25 DIAGNOSIS — C50412 Malignant neoplasm of upper-outer quadrant of left female breast: Secondary | ICD-10-CM | POA: Diagnosis not present

## 2020-12-25 DIAGNOSIS — Z9889 Other specified postprocedural states: Secondary | ICD-10-CM | POA: Insufficient documentation

## 2020-12-25 DIAGNOSIS — D508 Other iron deficiency anemias: Secondary | ICD-10-CM | POA: Diagnosis present

## 2021-01-10 ENCOUNTER — Ambulatory Visit: Payer: Medicare Other | Admitting: Cardiology

## 2021-01-10 ENCOUNTER — Encounter: Payer: Self-pay | Admitting: Cardiology

## 2021-01-10 ENCOUNTER — Ambulatory Visit (INDEPENDENT_AMBULATORY_CARE_PROVIDER_SITE_OTHER): Payer: Medicare Other | Admitting: Cardiology

## 2021-01-10 VITALS — BP 118/66 | HR 72 | Ht 63.0 in | Wt 289.0 lb

## 2021-01-10 DIAGNOSIS — I10 Essential (primary) hypertension: Secondary | ICD-10-CM

## 2021-01-10 DIAGNOSIS — I350 Nonrheumatic aortic (valve) stenosis: Secondary | ICD-10-CM

## 2021-01-10 DIAGNOSIS — M160 Bilateral primary osteoarthritis of hip: Secondary | ICD-10-CM

## 2021-01-10 DIAGNOSIS — I700 Atherosclerosis of aorta: Secondary | ICD-10-CM | POA: Insufficient documentation

## 2021-01-10 NOTE — Assessment & Plan Note (Signed)
Continue to encourage weight loss.  Currently utilizing wheelchair with degenerative joint disease.

## 2021-01-10 NOTE — Progress Notes (Signed)
Cardiology Office Note:    Date:  01/10/2021   ID:  REMIE MATHISON, DOB 04/24/1945, MRN [id number]  PCP:  Bridget Hartshorn, NP   [facility] Providers Cardiologist:  None     Referring MD: Bridget Hartshorn, NP    History of Present Illness:    MEYLI BOICE is a 75 y.o. female here for the evaluation of heart murmur at the request of H[place], Karie Schwalbe, NP  She has diabetes mellitus, dyslipidemia, hypertension and history of breast cancer, 5 years out and cancer free.  Her last echocardiogram was over 5 years ago when she started chemotherapy.  She is heart disease with her mother and father before the age of 58.  She denies any high risk symptoms such as syncope bleeding orthopnea PND  She does occasionally have some shortness of breath.  Not changed recently.  She does take iron for chronic anemia.  Her hemoglobin is around 10.  She also has occasional twinge of atypical chest discomfort chest wall pain.  Smoked years ago  Mother had MI  Son had heart murmur  Past Medical History:  Diagnosis Date   Anemia    takes iron supplement   Arthritis    knees   COPD (chronic obstructive pulmonary disease) (HCC)    Enlarged heart    GERD (gastroesophageal reflux disease)    Hidradenitis suppurativa    buttocks   History of breast cancer 12/2014   Hyperlipidemia    Hypertension    states under control with meds., has been on med. > 40 yrs.   Morbid obesity ([place])    Non-insulin dependent type 2 diabetes mellitus ([place])    Shortness of breath dyspnea    with exertion   Urinary frequency     Past Surgical History:  Procedure Laterality Date   ABDOMINAL HYSTERECTOMY  25 yrs ago   partial   BLADDER SUSPENSION     x 2   BREAST LUMPECTOMY WITH RADIOACTIVE SEED AND SENTINEL LYMPH NODE BIOPSY Left 12/25/2014   Procedure: RADIOACTIVE SEED GIUDED LEFT BREAST LUMPECTOMY, LEFT AXILLARY SENTINEL LYMPH NODE BIOPSY;  Surgeon: Alphonsa Overall, MD;  Location: [place];   Service: General;  Laterality: Left;   CARDIAC CATHETERIZATION  02/21/2011   Normal coronary arteries, normal EF   COLONOSCOPY WITH PROPOFOL  11/18/2013   ESOPHAGOGASTRODUODENOSCOPY (EGD) WITH PROPOFOL  11/18/2013   EXCISION HYDRADENITIS LABIA N/A 12/08/2013   Procedure: EXCISION HIDRADENITIS PUBIC AREA;  Surgeon: Alphonsa Overall, MD;  Location: [facility];  Service: General;  Laterality: N/A;   HYDRADENITIS EXCISION Left 12/08/2013   Procedure: EXCISION HIDRADENITIS AXILLA;  Surgeon: Alphonsa Overall, MD;  Location: [facility];  Service: General;  Laterality: Left;   INCISION AND DRAINAGE ABSCESS  08/17/2008   perineum and buttock   IRRIGATION AND DEBRIDEMENT ABSCESS Right 12/23/2012   Procedure: incision  AND DEBRIDEMENT right buttock infection ;  Surgeon: Shann Medal, MD;  Location: [facility];  Service: General;  Laterality: Right;   PILONIDAL CYST / SINUS EXCISION  06/04/2004   PILONIDAL CYST EXCISION     PORT-A-CATH REMOVAL Right 03/21/2016   Procedure: MINOR REMOVAL PORT-A-CATH;  Surgeon: Alphonsa Overall, MD;  Location: [place];  Service: General;  Laterality: Right;  MINOR REMOVAL PORT-A-CATH   PORTACATH PLACEMENT N/A 01/12/2015   Procedure: INSERTION PORT-A-CATH;  Surgeon: Alphonsa Overall, MD;  Location: [facility];  Service: General;  Laterality: N/A;   TONSILLECTOMY      Current Medications: Current Meds  Medication  Sig   acetaminophen (TYLENOL) 500 MG tablet Take 1,000 mg by mouth every 6 (six) hours as needed for mild pain or headache.   albuterol (PROVENTIL HFA;VENTOLIN HFA) 108 (90 BASE) MCG/ACT inhaler Inhale 2 puffs into the lungs every 4 (four) hours as needed for shortness of breath.   anastrozole (ARIMIDEX) 1 MG tablet Take 1 tablet (1 mg total) by mouth daily.   atorvastatin (LIPITOR) 20 MG tablet Take 20 mg by mouth every morning.   Blood Glucose Monitoring Suppl (BLOOD GLUCOSE MONITOR SYSTEM) w/Device KIT With lancets and strips #100 6rf Dx: E11.9 Test bid   Calcium 150 MG  TABS Take 1 tablet by mouth daily.    cholecalciferol (VITAMIN D) 1000 units tablet Take 2,000 Units by mouth daily.   ferrous sulfate 325 (65 FE) MG tablet Take 325 mg by mouth 3 (three) times daily with meals.   fluconazole (DIFLUCAN) 150 MG tablet Take 150 mg by mouth as needed.    furosemide (LASIX) 20 MG tablet Take 20 mg by mouth 3 (three) times a week.   glipiZIDE (GLUCOTROL XL) 10 MG 24 hr tablet Take 10 mg by mouth daily with breakfast.    losartan (COZAAR) 100 MG tablet Take 100 mg by mouth daily.   metFORMIN (GLUCOPHAGE-XR) 500 MG 24 hr tablet TAKE ONE TABLET BY MOUTH TWICE DAILY   metoprolol succinate (TOPROL-XL) 100 MG 24 hr tablet TAKE ONE (1) TABLET EACH DAY   Misc. Devices MISC Underpad's 6 Bags/15-90 per month  Incontinence Pads 3 Bags/20-60 per month  2 Box of Gloves per month  40 box of gauze  20 rolls of tape  Nutrition supplement/30 day supply   mupirocin ointment (BACTROBAN) 2 % SMARTSIG:1 Application Topical 2-3 Times Daily   pantoprazole (PROTONIX) 40 MG tablet TAKE 1 TABLET (40 MG TOTAL) BY MOUTH DAILY. (Patient taking differently: Take 40 mg by mouth as needed.)   polyethylene glycol (MIRALAX / GLYCOLAX) packet Take 17 g by mouth daily as needed for mild constipation.   predniSONE (DELTASONE) 20 MG tablet Take 20 mg by mouth daily with breakfast. As directed by PCP.   sodium chloride irrigation 0.9 % irrigation USE AS DIRECTED   sulfamethoxazole-trimethoprim (BACTRIM DS) 800-160 MG tablet Take 1 tablet by mouth 2 (two) times daily.   verapamil (CALAN-SR) 120 MG CR tablet Take 120 mg by mouth daily.     Allergies:   Lisinopril and Penicillins   Social History   Socioeconomic History   Marital status: Widowed    Spouse name: Not on file   Number of children: 5   Years of education: Not on file   Highest education level: Not on file  Occupational History   Occupation: Textile work    Comment: Disabled  Tobacco Use   Smoking status: Former    Years:  0.00    Types: Cigarettes    Quit date: 03/03/1994    Years since quitting: 26.8   Smokeless tobacco: Never  Vaping Use   Vaping Use: Never used  Substance and Sexual Activity   Alcohol use: No   Drug use: No   Sexual activity: Not on file  Other Topics Concern   Not on file  Social History Narrative   Lives with grandson.  Widowed.  Has 2 sons and 3 daughters   Social Determinants of Health   Financial Resource Strain: Not on file  Food Insecurity: Not on file  Transportation Needs: Not on file  Physical Activity: Not on file  Stress:  Not on file  Social Connections: Not on file     Family History: The patient's family history includes Breast cancer in her cousin, maternal aunt, maternal grandmother, and sister; Breast cancer (age of onset: 3) in her sister; Dementia in her maternal aunt; Diabetes in her sister; Heart attack in her mother; Hypertension in her brother, daughter, and son; Lung cancer in her brother; Lung cancer (age of onset: 69) in her father; Prostate cancer in her brother; Throat cancer in her brother.  ROS:   Please see the history of present illness.     All other systems reviewed and are negative.  EKGs/Labs/Other Studies Reviewed:    The following studies were reviewed today: Cardiac catheterization 2012-normal coronaries and normal EF  ECHO 2017:  - Left ventricle: The cavity size was normal. Wall thickness was    increased increased in a pattern of mild to moderate LVH.    Systolic function was normal. The estimated ejection fraction was    in the range of 60% to 65%. Wall motion was normal; there were no    regional wall motion abnormalities. Features are consistent with    a pseudonormal left ventricular filling pattern, with concomitant    abnormal relaxation and increased filling pressure (grade 2    diastolic dysfunction). Doppler parameters are consistent with    high ventricular filling pressure.  - Aortic valve: Mildly calcified  annulus. Trileaflet; moderately    thickened leaflets. There was mild to moderate stenosis. Mean    gradient (S): 17 mm Hg. Valve area (VTI): 1.13 cm^2. Valve area    (Vmax): 1.11 cm^2. Valve area (Vmean): 1.09 cm^2.  - Mitral valve: Mildly calcified annulus. Mildly thickened leaflets    . The findings are consistent with mild stenosis. There was mild    regurgitation. Valve area by pressure half-time: 2.84 cm^2.    Indexed valve area by pressure half-time: 1.18 cm^2/m^2.  - Left atrium: The atrium was mildly dilated.  - Atrial septum: No defect or patent foramen ovale was identified.  - Technically adequate study.   EKG:  EKG is  ordered today.  The ekg ordered today demonstrates sinus rhythm 72 nonspecific ST-T wave changes poor R wave progression  Recent Labs: 09/26/2020: ALT 20; BUN 22; Creatinine, Ser 1.10; Hemoglobin 10.3; Platelets 269; Potassium 4.2; Sodium 134  Recent Lipid Panel No results found for: CHOL, TRIG, HDL, CHOLHDL, VLDL, LDLCALC, LDLDIRECT   Risk Assessment/Calculations:          Physical Exam:    VS:  BP 118/66   Pulse 72   Ht 5' 3" (1.6 m)   Wt 289 lb (131.1 kg)   BMI 51.19 kg/m     Wt Readings from Last 3 Encounters:  01/10/21 289 lb (131.1 kg)  07/20/20 274 lb (124.3 kg)  12/26/19 271 lb (122.9 kg)     GEN: Sitting in wheelchair, cane at side well nourished, well developed in no acute distress HEENT: Normal NECK: No JVD; No carotid bruits LYMPHATICS: No lymphadenopathy CARDIAC: RRR, 2/6 systolic murmur, no rubs, gallops RESPIRATORY:  Clear to auscultation without rales, wheezing or rhonchi  ABDOMEN: Soft, non-tender, non-distended MUSCULOSKELETAL:  No edema; No deformity  SKIN: Warm and dry NEUROLOGIC:  Alert and oriented x 3 PSYCHIATRIC:  Normal affect   ASSESSMENT:    1. Primary hypertension   2. Nonrheumatic aortic valve stenosis   3. Morbid obesity ([place])   4. Osteoarthritis of both hips, unspecified osteoarthritis type   5. Aortic  atherosclerosis (  [place])    PLAN:    In order of problems listed above:  Aortic stenosis We will go ahead and check an echocardiogram since it has been several years.  Previously described as mild to moderate aortic stenosis in the year 2017.  Could have advanced.  Morbid obesity Continue to encourage weight loss.  Currently utilizing wheelchair with degenerative joint disease.  DJD (degenerative joint disease) Utilizing wheelchair secondary to DJD  Aortic atherosclerosis ([place]) Noted on CT scan personally reviewed and interpreted of abdomen in 2016.  Continuing with diabetes treatment.  Also on atorvastatin 20 mg.         Medication Adjustments/Labs and Tests Ordered: Current medicines are reviewed at length with the patient today.  Concerns regarding medicines are outlined above.  Orders Placed This Encounter  Procedures   EKG 12-Lead   ECHOCARDIOGRAM COMPLETE   No orders of the defined types were placed in this encounter.   Patient Instructions  Medication Instructions:   Your physician recommends that you continue on your current medications as directed. Please refer to the Current Medication list given to you today.  *If you need a refill on your cardiac medications before your next appointment, please call your pharmacy*   Lab Work:   None today   If you have labs (blood work) drawn today and your tests are completely normal, you will receive your results only by: [place] (if you have MyChart) OR A paper copy in the mail If you have any lab test that is abnormal or we need to change your treatment, we will call you to review the results.   Testing/Procedures: Your physician has requested that you have an echocardiogram. Echocardiography is a painless test that uses sound waves to create images of your heart. It provides your doctor with information about the size and shape of your heart and how well your heart's chambers and valves are working. This  procedure takes approximately one hour. There are no restrictions for this procedure.    Follow-Up: At [facility], you and your health needs are our priority.  As part of our continuing mission to provide you with exceptional heart care, we have created designated Provider Care Teams.  These Care Teams include your primary Cardiologist (physician) and Advanced Practice Providers (APPs -  Physician Assistants and Nurse Practitioners) who all work together to provide you with the care you need, when you need it.  We recommend signing up for the patient portal called "MyChart".  Sign up information is provided on this After Visit Summary.  MyChart is used to connect with patients for Virtual Visits (Telemedicine).  Patients are able to view lab/test results, encounter notes, upcoming appointments, etc.  Non-urgent messages can be sent to your provider as well.   To learn more about what you can do with MyChart, go to [email or web address].    Your next appointment:   12 month(s)  The format for your next appointment:   In Person  Provider:   You will see one of the following Advanced Practice Providers on your designated Care Team:   Bernerd Pho, PA-C  Ermalinda Barrios, [place]       Other Instructions None    Signed, Candee Furbish, MD  01/10/2021 3:52 PM    [place]

## 2021-01-10 NOTE — Patient Instructions (Signed)
Medication Instructions:   Your physician recommends that you continue on your current medications as directed. Please refer to the Current Medication list given to you today.  *If you need a refill on your cardiac medications before your next appointment, please call your pharmacy*   Lab Work:   None today   If you have labs (blood work) drawn today and your tests are completely normal, you will receive your results only by: [place] (if you have MyChart) OR A paper copy in the mail If you have any lab test that is abnormal or we need to change your treatment, we will call you to review the results.   Testing/Procedures: Your physician has requested that you have an echocardiogram. Echocardiography is a painless test that uses sound waves to create images of your heart. It provides your doctor with information about the size and shape of your heart and how well your heart's chambers and valves are working. This procedure takes approximately one hour. There are no restrictions for this procedure.    Follow-Up: At [facility], you and your health needs are our priority.  As part of our continuing mission to provide you with exceptional heart care, we have created designated Provider Care Teams.  These Care Teams include your primary Cardiologist (physician) and Advanced Practice Providers (APPs -  Physician Assistants and Nurse Practitioners) who all work together to provide you with the care you need, when you need it.  We recommend signing up for the patient portal called "MyChart".  Sign up information is provided on this After Visit Summary.  MyChart is used to connect with patients for Virtual Visits (Telemedicine).  Patients are able to view lab/test results, encounter notes, upcoming appointments, etc.  Non-urgent messages can be sent to your provider as well.   To learn more about what you can do with MyChart, go to [email or web address].    Your next appointment:    12 month(s)  The format for your next appointment:   In Person  Provider:   You will see one of the following Advanced Practice Providers on your designated Care Team:   [place], PA-C  Ermalinda Barrios, PA-C       Other Instructions None

## 2021-01-10 NOTE — Assessment & Plan Note (Signed)
Utilizing wheelchair secondary to DJD

## 2021-01-10 NOTE — Assessment & Plan Note (Signed)
Noted on CT scan personally reviewed and interpreted of abdomen in 2016.  Continuing with diabetes treatment.  Also on atorvastatin 20 mg.

## 2021-01-10 NOTE — Assessment & Plan Note (Signed)
We will go ahead and check an echocardiogram since it has been several years.  Previously described as mild to moderate aortic stenosis in the year 2017.  Could have advanced.

## 2021-01-15 NOTE — Progress Notes (Signed)
[facility] [address], [place] [address]   Patient Care Team: Bridget Hartshorn, NP as PCP - General (Adult Health Nurse Practitioner) Druscilla Brownie, MD as Consulting Physician (Dermatology) Minus Breeding, MD as Consulting Physician (Cardiology) Larey Dresser, MD as Consulting Physician (Cardiology) Kyung Rudd, MD as Consulting Physician (Radiation Oncology) Alphonsa Overall, MD as Consulting Physician (General Surgery) Patrici Ranks, MD (Inactive) as Consulting Physician (Hematology and Oncology)  SUMMARY OF ONCOLOGIC HISTORY: Oncology History  Breast cancer of upper-outer quadrant of left female breast ([place])  11/01/2014 Mammogram   Possible mass in the left breast upper outer quadrant measuring 13 mm suspicious for breast cancer confirmed through ultrasound a spiculated hypoechoic mass ill-defined, no enlarged lymph nodes   11/01/2014 Initial Diagnosis   Invasive ductal carcinoma, moderately differentiated, 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 The estimated ejection   fraction was in the range of 55% to 60%.    12/25/2014 Surgery   Left lumpectomy: IDC 1.7 cm, positive for LVI, with DCIS, 1/1 sentinel node positive deposit 1.9 cm with extracapsular extension, ER 90%, PR 90%, HER-2 positive ratio 2.4, Ki-67 29% T1 cN1 stage II a   02/01/2015 - 04/19/2015 Chemotherapy   Abraxane/Herceptin   03/20/2015 Echocardiogram   Systolic function was normal. The estimated ejection fraction was in the range of 60%  to 65%.   05/08/2015 - 06/22/2015 Radiation Therapy   Dr. Lisbeth Renshaw    05/10/2015 - 02/07/2016 Antibody Plan   Herceptin every 21 days to complete 52 weeks worth of therapy.   06/13/2015 Echocardiogram   2D echo- The estimated ejection fraction was in the range of 60% to 65%. Diastolic function is abnormal, indeterminate grade. Wall motion  was normal.   06/28/2015 Imaging   Bone density- BMD as determined from Femur Total Left is 0.994 g/cm2 with a T-Score of -0.1. This patient is considered normal according to [facility]) criteria.    07/13/2015 -  Anti-estrogen oral therapy   Arimidex daily   09/12/2015 Echocardiogram   The estimated ejection fraction was in the range of 60% to 65%. Wall motion was normal; there were no regional wall motion abnormalities. Doppler parameters are consistent with abnormal L ventricular relaxation (grade 1 diastolic dysfunction).   12/04/2015 Echocardiogram   Left ventricle: The cavity size was normal. Wall thickness was   increased increased in a pattern of mild to moderate LVH.   Systolic function was normal. The estimated ejection fraction was   in the range of 60% to 65%. Wall motion was normal; there were no   regional wall motion abnormalities. Features are consistent with   a pseudonormal left ventricular filling pattern, with concomitant   abnormal relaxation and increased filling pressure (grade 2   diastolic dysfunction). Doppler parameters are consistent with   high ventricular filling pressure.   03/21/2016 Procedure   Port removed by Dr. Lucia Gaskins     CHIEF COMPLIANT: Follow-up for left breast cancer   INTERVAL HISTORY: [name] is a 75 y.o. female here today for follow up of her left breast cancer. Her last visit was on 07/20/2020.   Today she reports feeling well. She reports bumps under her arms and on her buttock from her history of hidradenitis which occasionally bleed; she takes bactrim daily. She is taking anastrozole and tolerating it well.  She denies jaw pain.   REVIEW OF SYSTEMS:   Review of Systems  Constitutional:  Positive for fatigue. Negative for appetite change.  Respiratory:  Positive for shortness of breath.   Cardiovascular:  Positive for palpitations.  Musculoskeletal:  Positive for arthralgias (9/10 legs) and back pain  (9/10).  Neurological:  Positive for dizziness.  All other systems reviewed and are negative.  I have reviewed the past medical history, past surgical history, social history and family history with the patient and they are unchanged from previous note.   ALLERGIES:   is allergic to lisinopril and penicillins.   MEDICATIONS:  Current Outpatient Medications  Medication Sig Dispense Refill   acetaminophen (TYLENOL) 500 MG tablet Take 1,000 mg by mouth every 6 (six) hours as needed for mild pain or headache.     albuterol (PROVENTIL HFA;VENTOLIN HFA) 108 (90 BASE) MCG/ACT inhaler Inhale 2 puffs into the lungs every 4 (four) hours as needed for shortness of breath.     anastrozole (ARIMIDEX) 1 MG tablet Take 1 tablet (1 mg total) by mouth daily. 90 tablet 3   atorvastatin (LIPITOR) 20 MG tablet Take 20 mg by mouth every morning.     Blood Glucose Monitoring Suppl (BLOOD GLUCOSE MONITOR SYSTEM) w/Device KIT With lancets and strips #100 6rf Dx: E11.9 Test bid     Calcium 150 MG TABS Take 1 tablet by mouth daily.      cholecalciferol (VITAMIN D) 1000 units tablet Take 2,000 Units by mouth daily.     ferrous sulfate 325 (65 FE) MG tablet Take 325 mg by mouth 3 (three) times daily with meals.     fluconazole (DIFLUCAN) 150 MG tablet Take 150 mg by mouth as needed.      furosemide (LASIX) 20 MG tablet Take 20 mg by mouth 3 (three) times a week.     glipiZIDE (GLUCOTROL XL) 10 MG 24 hr tablet Take 10 mg by mouth daily with breakfast.      losartan (COZAAR) 100 MG tablet Take 100 mg by mouth daily.     metFORMIN (GLUCOPHAGE-XR) 500 MG 24 hr tablet TAKE ONE TABLET BY MOUTH TWICE DAILY     metoprolol succinate (TOPROL-XL) 100 MG 24 hr tablet TAKE ONE (1) TABLET EACH DAY     Misc. Devices MISC Underpad's 6 Bags/15-90 per month  Incontinence Pads 3 Bags/20-60 per month  2 Box of Gloves per month  40 box of gauze  20 rolls of tape  Nutrition supplement/30 day supply     mupirocin ointment  (BACTROBAN) 2 % SMARTSIG:1 Application Topical 2-3 Times Daily     pantoprazole (PROTONIX) 40 MG tablet TAKE 1 TABLET (40 MG TOTAL) BY MOUTH DAILY. (Patient taking differently: Take 40 mg by mouth as needed.) 90 tablet 0   polyethylene glycol (MIRALAX / GLYCOLAX) packet Take 17 g by mouth daily as needed for mild constipation. 14 each 0   predniSONE (DELTASONE) 20 MG tablet Take 20 mg by mouth daily with breakfast. As directed by PCP.     sodium chloride irrigation 0.9 % irrigation USE AS DIRECTED     sulfamethoxazole-trimethoprim (BACTRIM DS) 800-160 MG tablet Take 1 tablet by mouth 2 (two) times daily.     verapamil (CALAN-SR) 120 MG CR tablet Take 120 mg by mouth daily.     No current facility-administered medications for this visit.     PHYSICAL EXAMINATION: Performance status (ECOG): 1 - Symptomatic but completely ambulatory  Vitals:   01/16/21 1132  BP: 115/72  Pulse: 79  Resp: 18  Temp: 98.8 F (37.1 C)  SpO2: 97%   Wt Readings from Last 3 Encounters:  01/10/21 289 lb (131.1 kg)  07/20/20 274 lb (124.3 kg)  12/26/19 271 lb (122.9 kg)   Physical Exam Vitals reviewed.  Constitutional:      Appearance: Normal appearance.     Comments: In wheelchair  Cardiovascular:     Rate and Rhythm: Normal rate and regular rhythm.     Pulses: Normal pulses.     Heart sounds: Murmur heard.  Systolic (ejection) murmur is present.  Pulmonary:     Effort: Pulmonary effort is normal.     Breath sounds: Normal breath sounds.  Neurological:     General: No focal deficit present.     Mental Status: She is alert and oriented to person, place, and time.  Psychiatric:        Mood and Affect: Mood normal.        Behavior: Behavior normal.    Breast Exam Chaperone: Thana Ates     LABORATORY DATA:  I have reviewed the data as listed CMP Latest Ref Rng & Units 01/16/2021 09/26/2020 07/16/2020  Glucose 70 - 99 mg/dL 207(H) 157(H) 108(H)  BUN 8 - 23 mg/dL 24(H) 22 28(H)  Creatinine  0.44 - 1.00 mg/dL 1.26(H) 1.10(H) 1.25(H)  Sodium 135 - 145 mmol/L 134(L) 134(L) 135  Potassium 3.5 - 5.1 mmol/L 4.5 4.2 5.0  Chloride 98 - 111 mmol/L 100 100 101  CO2 22 - 32 mmol/L $RemoveB'24 26 25  'zCjVjLwG$ Calcium 8.9 - 10.3 mg/dL 9.4 9.1 9.7  Total Protein 6.5 - 8.1 g/dL 7.9 7.6 8.5(H)  Total Bilirubin 0.3 - 1.2 mg/dL 0.1(L) 0.5 0.4  Alkaline Phos 38 - 126 U/L 90 100 86  AST 15 - 41 U/L 15 16 12(L)  ALT 0 - 44 U/L $Remo'15 20 14   'IxSki$ No results found for: EHM094 Lab Results  Component Value Date   WBC 8.3 01/16/2021   HGB 10.6 (L) 01/16/2021   HCT 34.2 (L) 01/16/2021   MCV 89.8 01/16/2021   PLT 284 01/16/2021   NEUTROABS 5.7 01/16/2021    ASSESSMENT:  1. 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  She then underwent XRT by Dr. Lisbeth Renshaw (05/08/2015- 06/22/2015) with continuation of Herceptin x 52 weeks finishing on 02/07/2016. -Anastrozole started in May 2017, tolerating it very well.  Hot flashes minor or stable. -Mammogram on December 20, 2019, BI-RADS category   2.  Osteopenia: - DEXA scan on 06/12/2017 shows T score of -2.3 in the femoral neck.  Prior to that DEXA scan 2017 showed T score of -0.1. -Prolia started on September 18, 2017. -DEXA scan on December 20, 2019 with T score of -0.9.   3.  Normocytic anemia: -She is taking iron supplements.   PLAN:  1. 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 - She will continue anastrozole for 7 years. - Reviewed labs today which did not show any abnormalities in the LFTs. - Recommend follow-up in 1 year with repeat mammogram in October next year.   2.  Osteopenia: - Continue calcium and vitamin  D supplements.  Continue Prolia 6 months.   3.  Normocytic anemia: -  Anemia from chronic inflammation from hidradenitis flareups and what appears to be mild CKD for the last 5 months.  She is on Bactrim for hidradenitis. - Hemoglobin stable around 10.6.  Ferritin 476 and percent saturation 18.  B12 normal.  Breast Cancer therapy associated bone loss: I have recommended calcium, Vitamin D and weight bearing exercises.  Orders placed this encounter:  No orders of the defined types were placed in this encounter.   The patient has a good understanding of the overall plan. She agrees with it. She will call with any problems that may develop before the next visit here.  Derek Jack, MD [facility] [phone]   I, Thana Ates, am acting as a scribe for Dr. Derek Jack.  I, Derek Jack MD, have reviewed the above documentation for accuracy and completeness, and I agree with the above.

## 2021-01-16 ENCOUNTER — Inpatient Hospital Stay (HOSPITAL_COMMUNITY): Payer: Medicare Other | Attending: Hematology | Admitting: Hematology

## 2021-01-16 ENCOUNTER — Inpatient Hospital Stay (HOSPITAL_COMMUNITY): Payer: Medicare Other

## 2021-01-16 ENCOUNTER — Other Ambulatory Visit: Payer: Self-pay

## 2021-01-16 VITALS — BP 115/72 | HR 79 | Temp 98.8°F | Resp 18

## 2021-01-16 DIAGNOSIS — M8580 Other specified disorders of bone density and structure, unspecified site: Secondary | ICD-10-CM | POA: Insufficient documentation

## 2021-01-16 DIAGNOSIS — E538 Deficiency of other specified B group vitamins: Secondary | ICD-10-CM

## 2021-01-16 DIAGNOSIS — C50412 Malignant neoplasm of upper-outer quadrant of left female breast: Secondary | ICD-10-CM | POA: Diagnosis present

## 2021-01-16 DIAGNOSIS — L732 Hidradenitis suppurativa: Secondary | ICD-10-CM | POA: Insufficient documentation

## 2021-01-16 DIAGNOSIS — Z79811 Long term (current) use of aromatase inhibitors: Secondary | ICD-10-CM | POA: Diagnosis not present

## 2021-01-16 DIAGNOSIS — Z170 Estrogen receptor positive status [ER+]: Secondary | ICD-10-CM | POA: Diagnosis not present

## 2021-01-16 DIAGNOSIS — D513 Other dietary vitamin B12 deficiency anemia: Secondary | ICD-10-CM

## 2021-01-16 DIAGNOSIS — Z79899 Other long term (current) drug therapy: Secondary | ICD-10-CM | POA: Diagnosis not present

## 2021-01-16 DIAGNOSIS — E559 Vitamin D deficiency, unspecified: Secondary | ICD-10-CM

## 2021-01-16 DIAGNOSIS — D508 Other iron deficiency anemias: Secondary | ICD-10-CM

## 2021-01-16 DIAGNOSIS — D649 Anemia, unspecified: Secondary | ICD-10-CM | POA: Insufficient documentation

## 2021-01-16 DIAGNOSIS — Z993 Dependence on wheelchair: Secondary | ICD-10-CM | POA: Insufficient documentation

## 2021-01-16 DIAGNOSIS — Z9221 Personal history of antineoplastic chemotherapy: Secondary | ICD-10-CM | POA: Diagnosis not present

## 2021-01-16 LAB — COMPREHENSIVE METABOLIC PANEL
ALT: 15 U/L (ref 0–44)
AST: 15 U/L (ref 15–41)
Albumin: 3.5 g/dL (ref 3.5–5.0)
Alkaline Phosphatase: 90 U/L (ref 38–126)
Anion gap: 10 (ref 5–15)
BUN: 24 mg/dL — ABNORMAL HIGH (ref 8–23)
CO2: 24 mmol/L (ref 22–32)
Calcium: 9.4 mg/dL (ref 8.9–10.3)
Chloride: 100 mmol/L (ref 98–111)
Creatinine, Ser: 1.26 mg/dL — ABNORMAL HIGH (ref 0.44–1.00)
GFR, Estimated: 45 mL/min — ABNORMAL LOW (ref >60)
Glucose, Bld: 207 mg/dL — ABNORMAL HIGH (ref 70–99)
Potassium: 4.5 mmol/L (ref 3.5–5.1)
Sodium: 134 mmol/L — ABNORMAL LOW (ref 135–145)
Total Bilirubin: 0.1 mg/dL — ABNORMAL LOW (ref 0.3–1.2)
Total Protein: 7.9 g/dL (ref 6.5–8.1)

## 2021-01-16 LAB — IRON AND TIBC
Iron: 50 ug/dL (ref 28–170)
Saturation Ratios: 18 % (ref 10.4–31.8)
TIBC: 284 ug/dL (ref 250–450)
UIBC: 234 ug/dL

## 2021-01-16 LAB — CBC WITH DIFFERENTIAL/PLATELET
Abs Immature Granulocytes: 0.04 10*3/uL (ref 0.00–0.07)
Basophils Absolute: 0 10*3/uL (ref 0.0–0.1)
Basophils Relative: 0 %
Eosinophils Absolute: 0.2 10*3/uL (ref 0.0–0.5)
Eosinophils Relative: 3 %
HCT: 34.2 % — ABNORMAL LOW (ref 36.0–46.0)
Hemoglobin: 10.6 g/dL — ABNORMAL LOW (ref 12.0–15.0)
Immature Granulocytes: 1 %
Lymphocytes Relative: 23 %
Lymphs Abs: 1.9 10*3/uL (ref 0.7–4.0)
MCH: 27.8 pg (ref 26.0–34.0)
MCHC: 31 g/dL (ref 30.0–36.0)
MCV: 89.8 fL (ref 80.0–100.0)
Monocytes Absolute: 0.4 10*3/uL (ref 0.1–1.0)
Monocytes Relative: 5 %
Neutro Abs: 5.7 10*3/uL (ref 1.7–7.7)
Neutrophils Relative %: 68 %
Platelets: 284 10*3/uL (ref 150–400)
RBC: 3.81 MIL/uL — ABNORMAL LOW (ref 3.87–5.11)
RDW: 14.7 % (ref 11.5–15.5)
WBC: 8.3 10*3/uL (ref 4.0–10.5)
nRBC: 0 % (ref 0.0–0.2)

## 2021-01-16 LAB — VITAMIN D 25 HYDROXY (VIT D DEFICIENCY, FRACTURES): Vit D, 25-Hydroxy: 36.79 ng/mL (ref 30–100)

## 2021-01-16 LAB — FERRITIN: Ferritin: 476 ng/mL — ABNORMAL HIGH (ref 11–307)

## 2021-01-16 LAB — VITAMIN B12: Vitamin B-12: 614 pg/mL (ref 180–914)

## 2021-01-16 NOTE — Patient Instructions (Addendum)
Kristy Harris at [facility] Discharge Instructions  You were seen and examined today by Dr. Delton Coombes.  He reviewed your lab results which showed mild anemia and slight kidney disfunction - try to drink plenty of water - 2-3 liters per day.   Continue Arimidex as prescribed.  Return as scheduled for lab work and office visit.    Thank you for choosing [place] at [facility] to provide your oncology and hematology care.  To afford each patient quality time with our provider, please arrive at least 15 minutes before your scheduled appointment time.   If you have a lab appointment with the [facility] please come in thru the Main Entrance and check in at the main information desk.  You need to re-schedule your appointment should you arrive 10 or more minutes late.  We strive to give you quality time with our providers, and arriving late affects you and other patients whose appointments are after yours.  Also, if you no show three or more times for appointments you may be dismissed from the clinic at the providers discretion.     Again, thank you for choosing [facility].  Our hope is that these requests will decrease the amount of time that you wait before being seen by our physicians.       _____________________________________________________________  Should you have questions after your visit to [facility], please contact our office at [phone] and follow the prompts.  Our office hours are 8:00 a.m. and 4:30 p.m. Monday - Friday.  Please note that voicemails left after 4:00 p.m. may not be returned until the following business day.  We are closed weekends and major holidays.  You do have access to a nurse 24-7, just call the main number to the clinic [phone] and do not press any options, hold on the line and a nurse will answer the phone.    For prescription refill requests, have your pharmacy contact our  office and allow 72 hours.    Due to Covid, you will need to wear a mask upon entering the hospital. If you do not have a mask, a mask will be given to you at the Main Entrance upon arrival. For doctor visits, patients may have 1 support person age 36 or older with them. For treatment visits, patients can not have anyone with them due to social distancing guidelines and our immunocompromised population.

## 2021-01-16 NOTE — Progress Notes (Signed)
Patient is taking Arimidex as prescribed.  She has not missed any doses and reports no side effects at this time.

## 2021-01-17 NOTE — Addendum Note (Signed)
Addended by: Joie Bimler on: 01/17/2021 11:03 AM   Modules accepted: Orders

## 2021-02-22 ENCOUNTER — Ambulatory Visit (HOSPITAL_COMMUNITY): Payer: Medicare Other

## 2021-03-20 ENCOUNTER — Other Ambulatory Visit (HOSPITAL_COMMUNITY): Payer: Self-pay | Admitting: Hematology

## 2021-03-20 DIAGNOSIS — C50412 Malignant neoplasm of upper-outer quadrant of left female breast: Secondary | ICD-10-CM

## 2021-03-21 ENCOUNTER — Ambulatory Visit: Payer: Medicare Other | Admitting: Cardiology

## 2021-03-29 ENCOUNTER — Other Ambulatory Visit (HOSPITAL_COMMUNITY): Payer: Self-pay

## 2021-03-29 ENCOUNTER — Ambulatory Visit (HOSPITAL_COMMUNITY)
Admission: RE | Admit: 2021-03-29 | Discharge: 2021-03-29 | Disposition: A | Discharge status: Home / Self Care | Payer: Medicare Other | Source: Ambulatory Visit | Attending: Cardiology | Admitting: Cardiology

## 2021-03-29 DIAGNOSIS — C50412 Malignant neoplasm of upper-outer quadrant of left female breast: Secondary | ICD-10-CM

## 2021-03-29 DIAGNOSIS — I350 Nonrheumatic aortic (valve) stenosis: Secondary | ICD-10-CM | POA: Diagnosis present

## 2021-03-29 LAB — ECHOCARDIOGRAM COMPLETE
AR max vel: 1.34 cm2
AV Area VTI: 1.38 cm2
AV Area mean vel: 1.28 cm2
AV Mean grad: 20 mmHg
AV Peak grad: 41 mmHg
Ao pk vel: 3.2 m/s
Area-P 1/2: 2.92 cm2
Calc EF: 57.9 %
MV VTI: 2.35 cm2
S' Lateral: 2.8 cm
Single Plane A2C EF: 60.5 %
Single Plane A4C EF: 53.9 %

## 2021-03-29 NOTE — Progress Notes (Signed)
*  PRELIMINARY RESULTS* Echocardiogram 2D Echocardiogram has been performed.  Kristy Harris 03/29/2021, 12:38 PM

## 2021-04-01 ENCOUNTER — Inpatient Hospital Stay (HOSPITAL_COMMUNITY): Payer: Medicare Other

## 2021-04-01 ENCOUNTER — Inpatient Hospital Stay (HOSPITAL_COMMUNITY): Payer: Medicare Other | Attending: Hematology

## 2021-04-01 ENCOUNTER — Other Ambulatory Visit: Payer: Self-pay

## 2021-04-01 VITALS — BP 140/68 | HR 80 | Temp 98.7°F | Resp 18 | Wt 285.7 lb

## 2021-04-01 DIAGNOSIS — C773 Secondary and unspecified malignant neoplasm of axilla and upper limb lymph nodes: Secondary | ICD-10-CM | POA: Insufficient documentation

## 2021-04-01 DIAGNOSIS — M8580 Other specified disorders of bone density and structure, unspecified site: Secondary | ICD-10-CM | POA: Diagnosis present

## 2021-04-01 DIAGNOSIS — M85852 Other specified disorders of bone density and structure, left thigh: Secondary | ICD-10-CM

## 2021-04-01 DIAGNOSIS — Z170 Estrogen receptor positive status [ER+]: Secondary | ICD-10-CM | POA: Diagnosis not present

## 2021-04-01 DIAGNOSIS — C50412 Malignant neoplasm of upper-outer quadrant of left female breast: Secondary | ICD-10-CM

## 2021-04-01 LAB — COMPREHENSIVE METABOLIC PANEL
ALT: 11 U/L (ref 0–44)
AST: 12 U/L — ABNORMAL LOW (ref 15–41)
Albumin: 3.3 g/dL — ABNORMAL LOW (ref 3.5–5.0)
Alkaline Phosphatase: 82 U/L (ref 38–126)
Anion gap: 7 (ref 5–15)
BUN: 17 mg/dL (ref 8–23)
CO2: 27 mmol/L (ref 22–32)
Calcium: 9.2 mg/dL (ref 8.9–10.3)
Chloride: 102 mmol/L (ref 98–111)
Creatinine, Ser: 1 mg/dL (ref 0.44–1.00)
GFR, Estimated: 59 mL/min — ABNORMAL LOW (ref >60)
Glucose, Bld: 174 mg/dL — ABNORMAL HIGH (ref 70–99)
Potassium: 4.1 mmol/L (ref 3.5–5.1)
Sodium: 136 mmol/L (ref 135–145)
Total Bilirubin: 0.5 mg/dL (ref 0.3–1.2)
Total Protein: 7.7 g/dL (ref 6.5–8.1)

## 2021-04-01 LAB — CBC WITH DIFFERENTIAL/PLATELET
Abs Immature Granulocytes: 0.03 10*3/uL (ref 0.00–0.07)
Basophils Absolute: 0 10*3/uL (ref 0.0–0.1)
Basophils Relative: 0 %
Eosinophils Absolute: 0.2 10*3/uL (ref 0.0–0.5)
Eosinophils Relative: 3 %
HCT: 31.7 % — ABNORMAL LOW (ref 36.0–46.0)
Hemoglobin: 9.2 g/dL — ABNORMAL LOW (ref 12.0–15.0)
Immature Granulocytes: 1 %
Lymphocytes Relative: 24 %
Lymphs Abs: 1.5 10*3/uL (ref 0.7–4.0)
MCH: 25.8 pg — ABNORMAL LOW (ref 26.0–34.0)
MCHC: 29 g/dL — ABNORMAL LOW (ref 30.0–36.0)
MCV: 89 fL (ref 80.0–100.0)
Monocytes Absolute: 0.3 10*3/uL (ref 0.1–1.0)
Monocytes Relative: 5 %
Neutro Abs: 4.4 10*3/uL (ref 1.7–7.7)
Neutrophils Relative %: 67 %
Platelets: 277 10*3/uL (ref 150–400)
RBC: 3.56 MIL/uL — ABNORMAL LOW (ref 3.87–5.11)
RDW: 15.5 % (ref 11.5–15.5)
WBC: 6.4 10*3/uL (ref 4.0–10.5)
nRBC: 0 % (ref 0.0–0.2)

## 2021-04-01 MED ORDER — DENOSUMAB 60 MG/ML ~~LOC~~ SOSY
60.0000 mg | PREFILLED_SYRINGE | Freq: Once | SUBCUTANEOUS | Status: AC
  Administered 2021-04-01: 60 mg via SUBCUTANEOUS
  Filled 2021-04-01: qty 1

## 2021-04-01 NOTE — Patient Instructions (Signed)
[facility]  Discharge Instructions: ?Thank you for choosing [facility] to provide your oncology and hematology care.  ?If you have a lab appointment with the Cancer Center, please come in thru the Main Entrance and check in at the main information desk. ? ?Wear comfortable clothing and clothing appropriate for easy access to any Portacath or PICC line.  ? ?We strive to give you quality time with your provider. You may need to reschedule your appointment if you arrive late (15 or more minutes).  Arriving late affects you and other patients whose appointments are after yours.  Also, if you miss three or more appointments without notifying the office, you may be dismissed from the clinic at the provider?s discretion.    ?  ?For prescription refill requests, have your pharmacy contact our office and allow 72 hours for refills to be completed.   ? ?Today you received the following chemotherapy and/or immunotherapy agents Prolia    ?  ?To help prevent nausea and vomiting after your treatment, we encourage you to take your nausea medication as directed. ? ?BELOW ARE SYMPTOMS THAT SHOULD BE REPORTED IMMEDIATELY: ?*FEVER GREATER THAN 100.4 F (38 ?C) OR HIGHER ?*CHILLS OR SWEATING ?*NAUSEA AND VOMITING THAT IS NOT CONTROLLED WITH YOUR NAUSEA MEDICATION ?*UNUSUAL SHORTNESS OF BREATH ?*UNUSUAL BRUISING OR BLEEDING ?*URINARY PROBLEMS (pain or burning when urinating, or frequent urination) ?*BOWEL PROBLEMS (unusual diarrhea, constipation, pain near the anus) ?TENDERNESS IN MOUTH AND THROAT WITH OR WITHOUT PRESENCE OF ULCERS (sore throat, sores in mouth, or a toothache) ?UNUSUAL RASH, SWELLING OR PAIN  ?UNUSUAL VAGINAL DISCHARGE OR ITCHING  ? ?Items with * indicate a potential emergency and should be followed up as soon as possible or go to the Emergency Department if any problems should occur. ? ?Please show the CHEMOTHERAPY ALERT CARD or IMMUNOTHERAPY ALERT CARD at check-in to the Emergency  Department and triage nurse. ? ?Should you have questions after your visit or need to cancel or reschedule your appointment, please contact [facility] [phone]  and follow the prompts.  Office hours are 8:00 a.m. to 4:30 p.m. Monday - Friday. Please note that voicemails left after 4:00 p.m. may not be returned until the following business day.  We are closed weekends and major holidays. You have access to a nurse at all times for urgent questions. Please call the main number to the clinic [phone] and follow the prompts. ? ?For any non-urgent questions, you may also contact your provider using MyChart. We now offer e-Visits for anyone 18 and older to request care online for non-urgent symptoms. For details visit [email or web address]. ?  ?Also download the MyChart app! Go to the app store, search "MyChart", open the app, select [facility], and log in with your MyChart username and password. ? ?Due to Covid, a mask is required upon entering the hospital/clinic. If you do not have a mask, one will be given to you upon arrival. For doctor visits, patients may have 1 support person aged 18 or older with them. For treatment visits, patients cannot have anyone with them due to current Covid guidelines and our immunocompromised population.  ?

## 2021-04-01 NOTE — Progress Notes (Signed)
Kristy Harris presents today for Prolia injection per the provider's orders.  Patient has been taking Calcium/vitamin D supplements, no prior or up coming dental work, and no jaw pain.  Stable during administration without incident; injection site WNL; see MAR for injection details.  Patient tolerated procedure well and without incident.  No questions or complaints noted at this time.  Discharge from clinic via wheelchair in stable condition.  Alert and oriented X 3.  Follow up with [facility] as scheduled.

## 2021-09-30 ENCOUNTER — Inpatient Hospital Stay (HOSPITAL_COMMUNITY): Payer: Medicare Other

## 2021-09-30 ENCOUNTER — Inpatient Hospital Stay (HOSPITAL_COMMUNITY): Payer: Medicare Other | Attending: Hematology

## 2021-09-30 VITALS — BP 137/62 | HR 72 | Temp 98.0°F | Resp 18

## 2021-09-30 DIAGNOSIS — Z79811 Long term (current) use of aromatase inhibitors: Secondary | ICD-10-CM | POA: Insufficient documentation

## 2021-09-30 DIAGNOSIS — M85859 Other specified disorders of bone density and structure, unspecified thigh: Secondary | ICD-10-CM | POA: Insufficient documentation

## 2021-09-30 DIAGNOSIS — C50412 Malignant neoplasm of upper-outer quadrant of left female breast: Secondary | ICD-10-CM | POA: Insufficient documentation

## 2021-09-30 DIAGNOSIS — M85852 Other specified disorders of bone density and structure, left thigh: Secondary | ICD-10-CM

## 2021-09-30 DIAGNOSIS — Z170 Estrogen receptor positive status [ER+]: Secondary | ICD-10-CM | POA: Insufficient documentation

## 2021-09-30 LAB — CBC WITH DIFFERENTIAL/PLATELET
Abs Immature Granulocytes: 0.06 10*3/uL (ref 0.00–0.07)
Basophils Absolute: 0 10*3/uL (ref 0.0–0.1)
Basophils Relative: 0 %
Eosinophils Absolute: 0.3 10*3/uL (ref 0.0–0.5)
Eosinophils Relative: 3 %
HCT: 30.7 % — ABNORMAL LOW (ref 36.0–46.0)
Hemoglobin: 9.4 g/dL — ABNORMAL LOW (ref 12.0–15.0)
Immature Granulocytes: 1 %
Lymphocytes Relative: 20 %
Lymphs Abs: 1.7 10*3/uL (ref 0.7–4.0)
MCH: 26.6 pg (ref 26.0–34.0)
MCHC: 30.6 g/dL (ref 30.0–36.0)
MCV: 86.7 fL (ref 80.0–100.0)
Monocytes Absolute: 0.4 10*3/uL (ref 0.1–1.0)
Monocytes Relative: 5 %
Neutro Abs: 6 10*3/uL (ref 1.7–7.7)
Neutrophils Relative %: 71 %
Platelets: 319 10*3/uL (ref 150–400)
RBC: 3.54 MIL/uL — ABNORMAL LOW (ref 3.87–5.11)
RDW: 15.2 % (ref 11.5–15.5)
WBC: 8.5 10*3/uL (ref 4.0–10.5)
nRBC: 0 % (ref 0.0–0.2)

## 2021-09-30 LAB — COMPREHENSIVE METABOLIC PANEL
ALT: 10 U/L (ref 0–44)
AST: 12 U/L — ABNORMAL LOW (ref 15–41)
Albumin: 3.2 g/dL — ABNORMAL LOW (ref 3.5–5.0)
Alkaline Phosphatase: 81 U/L (ref 38–126)
Anion gap: 8 (ref 5–15)
BUN: 17 mg/dL (ref 8–23)
CO2: 26 mmol/L (ref 22–32)
Calcium: 9.1 mg/dL (ref 8.9–10.3)
Chloride: 102 mmol/L (ref 98–111)
Creatinine, Ser: 1 mg/dL (ref 0.44–1.00)
GFR, Estimated: 59 mL/min — ABNORMAL LOW (ref >60)
Glucose, Bld: 191 mg/dL — ABNORMAL HIGH (ref 70–99)
Potassium: 4.3 mmol/L (ref 3.5–5.1)
Sodium: 136 mmol/L (ref 135–145)
Total Bilirubin: 0.5 mg/dL (ref 0.3–1.2)
Total Protein: 7.8 g/dL (ref 6.5–8.1)

## 2021-09-30 MED ORDER — DENOSUMAB 60 MG/ML ~~LOC~~ SOSY
60.0000 mg | PREFILLED_SYRINGE | Freq: Once | SUBCUTANEOUS | Status: AC
  Administered 2021-09-30: 60 mg via SUBCUTANEOUS
  Filled 2021-09-30: qty 1

## 2021-09-30 NOTE — Patient Instructions (Signed)
[facility]  Discharge Instructions: Thank you for choosing [facility] to provide your oncology and hematology care.  If you have a lab appointment with the [facility], please come in thru the Main Entrance and check in at the main information desk.  Wear comfortable clothing and clothing appropriate for easy access to any Portacath or PICC line.   We strive to give you quality time with your provider. You may need to reschedule your appointment if you arrive late (15 or more minutes).  Arriving late affects you and other patients whose appointments are after yours.  Also, if you miss three or more appointments without notifying the office, you may be dismissed from the clinic at the provider's discretion.      For prescription refill requests, have your pharmacy contact our office and allow 72 hours for refills to be completed.    Today you received the following chemotherapy and/or immunotherapy agents Prolia      To help prevent nausea and vomiting after your treatment, we encourage you to take your nausea medication as directed.  BELOW ARE SYMPTOMS THAT SHOULD BE REPORTED IMMEDIATELY: *FEVER GREATER THAN 100.4 F (38 C) OR HIGHER *CHILLS OR SWEATING *NAUSEA AND VOMITING THAT IS NOT CONTROLLED WITH YOUR NAUSEA MEDICATION *UNUSUAL SHORTNESS OF BREATH *UNUSUAL BRUISING OR BLEEDING *URINARY PROBLEMS (pain or burning when urinating, or frequent urination) *BOWEL PROBLEMS (unusual diarrhea, constipation, pain near the anus) TENDERNESS IN MOUTH AND THROAT WITH OR WITHOUT PRESENCE OF ULCERS (sore throat, sores in mouth, or a toothache) UNUSUAL RASH, SWELLING OR PAIN  UNUSUAL VAGINAL DISCHARGE OR ITCHING   Items with * indicate a potential emergency and should be followed up as soon as possible or go to the Emergency Department if any problems should occur.  Please show the CHEMOTHERAPY ALERT CARD or IMMUNOTHERAPY ALERT CARD at check-in to the Emergency  Department and triage nurse.  Should you have questions after your visit or need to cancel or reschedule your appointment, please contact [facility] [phone]  and follow the prompts.  Office hours are 8:00 a.m. to 4:30 p.m. Monday - Friday. Please note that voicemails left after 4:00 p.m. may not be returned until the following business day.  We are closed weekends and major holidays. You have access to a nurse at all times for urgent questions. Please call the main number to the clinic [phone] and follow the prompts.  For any non-urgent questions, you may also contact your provider using MyChart. We now offer e-Visits for anyone 75 and older to request care online for non-urgent symptoms. For details visit [email or web address].   Also download the MyChart app! Go to the app store, search "MyChart", open the app, select [facility], and log in with your MyChart username and password.  Masks are optional in the cancer centers. If you would like for your care team to wear a mask while they are taking care of you, please let them know. For doctor visits, patients may have with them one support person who is at least 74 years old. At this time, visitors are not allowed in the infusion area.

## 2021-09-30 NOTE — Progress Notes (Signed)
Kristy Harris presents today for Prolia injection per the provider's orders.  Stable during administration without incident; injection site WNL; see MAR for injection details.  Patient tolerated procedure well and without incident.  No questions or complaints noted at this time.

## 2021-12-20 ENCOUNTER — Encounter (HOSPITAL_COMMUNITY): Payer: Self-pay | Admitting: Hematology

## 2021-12-27 ENCOUNTER — Ambulatory Visit (HOSPITAL_COMMUNITY)
Admission: RE | Admit: 2021-12-27 | Discharge: 2021-12-27 | Disposition: A | Discharge status: Home / Self Care | Payer: Medicare Other | Source: Ambulatory Visit | Attending: Hematology | Admitting: Hematology

## 2021-12-27 DIAGNOSIS — C50412 Malignant neoplasm of upper-outer quadrant of left female breast: Secondary | ICD-10-CM

## 2021-12-27 DIAGNOSIS — Z1231 Encounter for screening mammogram for malignant neoplasm of breast: Secondary | ICD-10-CM | POA: Diagnosis present

## 2021-12-27 DIAGNOSIS — Z853 Personal history of malignant neoplasm of breast: Secondary | ICD-10-CM | POA: Diagnosis not present

## 2022-01-13 ENCOUNTER — Inpatient Hospital Stay: Payer: Medicare Other | Attending: Hematology

## 2022-01-13 DIAGNOSIS — Z993 Dependence on wheelchair: Secondary | ICD-10-CM | POA: Diagnosis not present

## 2022-01-13 DIAGNOSIS — C50412 Malignant neoplasm of upper-outer quadrant of left female breast: Secondary | ICD-10-CM | POA: Insufficient documentation

## 2022-01-13 DIAGNOSIS — Z9221 Personal history of antineoplastic chemotherapy: Secondary | ICD-10-CM | POA: Diagnosis not present

## 2022-01-13 DIAGNOSIS — D649 Anemia, unspecified: Secondary | ICD-10-CM | POA: Diagnosis not present

## 2022-01-13 DIAGNOSIS — Z923 Personal history of irradiation: Secondary | ICD-10-CM | POA: Insufficient documentation

## 2022-01-13 DIAGNOSIS — N951 Menopausal and female climacteric states: Secondary | ICD-10-CM | POA: Insufficient documentation

## 2022-01-13 DIAGNOSIS — E559 Vitamin D deficiency, unspecified: Secondary | ICD-10-CM

## 2022-01-13 DIAGNOSIS — Z79899 Other long term (current) drug therapy: Secondary | ICD-10-CM | POA: Diagnosis not present

## 2022-01-13 DIAGNOSIS — Z170 Estrogen receptor positive status [ER+]: Secondary | ICD-10-CM | POA: Insufficient documentation

## 2022-01-13 DIAGNOSIS — E538 Deficiency of other specified B group vitamins: Secondary | ICD-10-CM

## 2022-01-13 DIAGNOSIS — Z79811 Long term (current) use of aromatase inhibitors: Secondary | ICD-10-CM | POA: Diagnosis not present

## 2022-01-13 DIAGNOSIS — M8580 Other specified disorders of bone density and structure, unspecified site: Secondary | ICD-10-CM | POA: Insufficient documentation

## 2022-01-13 DIAGNOSIS — C773 Secondary and unspecified malignant neoplasm of axilla and upper limb lymph nodes: Secondary | ICD-10-CM | POA: Insufficient documentation

## 2022-01-13 DIAGNOSIS — D508 Other iron deficiency anemias: Secondary | ICD-10-CM

## 2022-01-13 LAB — CBC WITH DIFFERENTIAL/PLATELET
Abs Immature Granulocytes: 0.03 10*3/uL (ref 0.00–0.07)
Basophils Absolute: 0 10*3/uL (ref 0.0–0.1)
Basophils Relative: 0 %
Eosinophils Absolute: 0.3 10*3/uL (ref 0.0–0.5)
Eosinophils Relative: 4 %
HCT: 29.9 % — ABNORMAL LOW (ref 36.0–46.0)
Hemoglobin: 9 g/dL — ABNORMAL LOW (ref 12.0–15.0)
Immature Granulocytes: 0 %
Lymphocytes Relative: 17 %
Lymphs Abs: 1.4 10*3/uL (ref 0.7–4.0)
MCH: 25.6 pg — ABNORMAL LOW (ref 26.0–34.0)
MCHC: 30.1 g/dL (ref 30.0–36.0)
MCV: 85.2 fL (ref 80.0–100.0)
Monocytes Absolute: 0.4 10*3/uL (ref 0.1–1.0)
Monocytes Relative: 6 %
Neutro Abs: 5.9 10*3/uL (ref 1.7–7.7)
Neutrophils Relative %: 73 %
Platelets: 324 10*3/uL (ref 150–400)
RBC: 3.51 MIL/uL — ABNORMAL LOW (ref 3.87–5.11)
RDW: 15.7 % — ABNORMAL HIGH (ref 11.5–15.5)
WBC: 8.1 10*3/uL (ref 4.0–10.5)
nRBC: 0 % (ref 0.0–0.2)

## 2022-01-13 LAB — COMPREHENSIVE METABOLIC PANEL
ALT: 13 U/L (ref 0–44)
AST: 12 U/L — ABNORMAL LOW (ref 15–41)
Albumin: 3.3 g/dL — ABNORMAL LOW (ref 3.5–5.0)
Alkaline Phosphatase: 79 U/L (ref 38–126)
Anion gap: 9 (ref 5–15)
BUN: 20 mg/dL (ref 8–23)
CO2: 27 mmol/L (ref 22–32)
Calcium: 9.2 mg/dL (ref 8.9–10.3)
Chloride: 101 mmol/L (ref 98–111)
Creatinine, Ser: 1.2 mg/dL — ABNORMAL HIGH (ref 0.44–1.00)
GFR, Estimated: 48 mL/min — ABNORMAL LOW (ref >60)
Glucose, Bld: 84 mg/dL (ref 70–99)
Potassium: 4.1 mmol/L (ref 3.5–5.1)
Sodium: 137 mmol/L (ref 135–145)
Total Bilirubin: 0.4 mg/dL (ref 0.3–1.2)
Total Protein: 8.1 g/dL (ref 6.5–8.1)

## 2022-01-13 LAB — IRON AND TIBC
Iron: 25 ug/dL — ABNORMAL LOW (ref 28–170)
Saturation Ratios: 10 % — ABNORMAL LOW (ref 10.4–31.8)
TIBC: 245 ug/dL — ABNORMAL LOW (ref 250–450)
UIBC: 220 ug/dL

## 2022-01-13 LAB — VITAMIN B12: Vitamin B-12: 553 pg/mL (ref 180–914)

## 2022-01-13 LAB — VITAMIN D 25 HYDROXY (VIT D DEFICIENCY, FRACTURES): Vit D, 25-Hydroxy: 37.24 ng/mL (ref 30–100)

## 2022-01-13 LAB — FERRITIN: Ferritin: 731 ng/mL — ABNORMAL HIGH (ref 11–307)

## 2022-01-20 ENCOUNTER — Inpatient Hospital Stay (HOSPITAL_BASED_OUTPATIENT_CLINIC_OR_DEPARTMENT_OTHER): Payer: Medicare Other | Admitting: Hematology

## 2022-01-20 VITALS — BP 138/67 | HR 79 | Temp 98.5°F | Resp 20 | Wt 254.5 lb

## 2022-01-20 DIAGNOSIS — D508 Other iron deficiency anemias: Secondary | ICD-10-CM

## 2022-01-20 DIAGNOSIS — C50412 Malignant neoplasm of upper-outer quadrant of left female breast: Secondary | ICD-10-CM | POA: Diagnosis not present

## 2022-01-20 NOTE — Patient Instructions (Addendum)
[facility]  Discharge Instructions  You were seen and examined today by Dr. Delton Coombes.  Dr. Delton Coombes discussed your most recent lab work and mammogram which revealed that everything looks good except your kidney function is elevated and your hemoglobin is slightly low.  Dr. Delton Coombes will recheck your labs before your next appointment to follow up on your slight anemia.  Follow-up as scheduled in 6 months with labs.    Thank you for choosing [facility] to provide your oncology and hematology care.   To afford each patient quality time with our provider, please arrive at least 15 minutes before your scheduled appointment time. You may need to reschedule your appointment if you arrive late (10 or more minutes). Arriving late affects you and other patients whose appointments are after yours.  Also, if you miss three or more appointments without notifying the office, you may be dismissed from the clinic at the provider's discretion.    Again, thank you for choosing [facility].  Our hope is that these requests will decrease the amount of time that you wait before being seen by our physicians.   If you have a lab appointment with the [facility] please come in thru the Main Entrance and check in at the main information desk.           _____________________________________________________________  Should you have questions after your visit to [facility], please contact our office at [phone] and follow the prompts.  Our office hours are 8:00 a.m. to 4:30 p.m. Monday - Thursday and 8:00 a.m. to 2:30 p.m. Friday.  Please note that voicemails left after 4:00 p.m. may not be returned until the following business day.  We are closed weekends and all major holidays.  You do have access to a nurse 24-7, just call the main number to the clinic [phone] and do not press any options, hold on the line and a  nurse will answer the phone.    For prescription refill requests, have your pharmacy contact our office and allow 72 hours.    Masks are optional in the cancer centers. If you would like for your care team to wear a mask while they are taking care of you, please let them know. You may have one support person who is at least 74 years old accompany you for your appointments.

## 2022-01-20 NOTE — Progress Notes (Signed)
[facility] [address], [place] [address]   Patient Care Team: Bridget Hartshorn, NP as PCP - General (Adult Health Nurse Practitioner) Druscilla Brownie, MD as Consulting Physician (Dermatology) Minus Breeding, MD as Consulting Physician (Cardiology) Larey Dresser, MD as Consulting Physician (Cardiology) Kyung Rudd, MD as Consulting Physician (Radiation Oncology) Alphonsa Overall, MD as Consulting Physician (General Surgery) Patrici Ranks, MD (Inactive) as Consulting Physician (Hematology and Oncology)  SUMMARY OF ONCOLOGIC HISTORY: Oncology History  Breast cancer of upper-outer quadrant of left female breast ([place])  11/01/2014 Mammogram   Possible mass in the left breast upper outer quadrant measuring 13 mm suspicious for breast cancer confirmed through ultrasound a spiculated hypoechoic mass ill-defined, no enlarged lymph nodes   11/01/2014 Initial Diagnosis   Invasive ductal carcinoma, moderately differentiated, 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 The estimated ejection   fraction was in the range of 55% to 60%.    12/25/2014 Surgery   Left lumpectomy: IDC 1.7 cm, positive for LVI, with DCIS, 1/1 sentinel node positive deposit 1.9 cm with extracapsular extension, ER 90%, PR 90%, HER-2 positive ratio 2.4, Ki-67 29% T1 cN1 stage II a   02/01/2015 - 04/19/2015 Chemotherapy   Abraxane/Herceptin   03/20/2015 Echocardiogram   Systolic function was normal. The estimated ejection fraction was in the range of 60%  to 65%.   05/08/2015 - 06/22/2015 Radiation Therapy   Dr. Lisbeth Renshaw    05/10/2015 - 02/07/2016 Antibody Plan   Herceptin every 21 days to complete 52 weeks worth of therapy.   06/13/2015 Echocardiogram   2D echo- The estimated ejection fraction was in the range of 60% to 65%. Diastolic function is abnormal, indeterminate grade. Wall motion  was normal.   06/28/2015 Imaging   Bone density- BMD as determined from Femur Total Left is 0.994 g/cm2 with a T-Score of -0.1. This patient is considered normal according to [facility]) criteria.    07/13/2015 -  Anti-estrogen oral therapy   Arimidex daily   09/12/2015 Echocardiogram   The estimated ejection fraction was in the range of 60% to 65%. Wall motion was normal; there were no regional wall motion abnormalities. Doppler parameters are consistent with abnormal L ventricular relaxation (grade 1 diastolic dysfunction).   12/04/2015 Echocardiogram   Left ventricle: The cavity size was normal. Wall thickness was   increased increased in a pattern of mild to moderate LVH.   Systolic function was normal. The estimated ejection fraction was   in the range of 60% to 65%. Wall motion was normal; there were no   regional wall motion abnormalities. Features are consistent with   a pseudonormal left ventricular filling pattern, with concomitant   abnormal relaxation and increased filling pressure (grade 2   diastolic dysfunction). Doppler parameters are consistent with   high ventricular filling pressure.   03/21/2016 Procedure   Port removed by Dr. Lucia Gaskins     CHIEF COMPLIANT: Follow-up for left breast cancer   INTERVAL HISTORY: [name] is a 76 y.o. female seen for follow-up of left breast cancer normocytic anemia.  She is tolerating anastrozole reasonably well.  She has hot flashes.  She is also taking iron tablet twice daily.  Denies any bleeding per rectum or melena.  Energy levels are reported as low.  REVIEW OF SYSTEMS:   Review of  Systems  Constitutional:  Negative for appetite change.  Respiratory:  Positive for shortness of breath.   Musculoskeletal:  Positive for arthralgias (9/10 legs) and back pain (9/10).  All other systems reviewed and are negative.   I have reviewed the past medical history, past surgical history, social history and family  history with the patient and they are unchanged from previous note.   ALLERGIES:   is allergic to lisinopril and penicillins.   MEDICATIONS:  Current Outpatient Medications  Medication Sig Dispense Refill   acetaminophen (TYLENOL) 500 MG tablet Take 1,000 mg by mouth every 6 (six) hours as needed for mild pain or headache.     albuterol (PROVENTIL HFA;VENTOLIN HFA) 108 (90 BASE) MCG/ACT inhaler Inhale 2 puffs into the lungs every 4 (four) hours as needed for shortness of breath.     anastrozole (ARIMIDEX) 1 MG tablet TAKE 1 TABLET BY MOUTH EVERY DAY 90 tablet 3   atorvastatin (LIPITOR) 20 MG tablet Take 20 mg by mouth every morning.     Blood Glucose Monitoring Suppl (BLOOD GLUCOSE MONITOR SYSTEM) w/Device KIT With lancets and strips #100 6rf Dx: E11.9 Test bid     Calcium 150 MG TABS Take 1 tablet by mouth daily.      cholecalciferol (VITAMIN D) 1000 units tablet Take 2,000 Units by mouth daily.     ferrous sulfate 325 (65 FE) MG tablet Take 325 mg by mouth 3 (three) times daily with meals.     fluconazole (DIFLUCAN) 150 MG tablet Take 150 mg by mouth as needed.      furosemide (LASIX) 20 MG tablet Take 20 mg by mouth 3 (three) times a week.     glipiZIDE (GLUCOTROL XL) 10 MG 24 hr tablet Take 10 mg by mouth daily with breakfast.      HYDROcodone-acetaminophen (NORCO/VICODIN) 5-325 MG tablet Take 1 tablet by mouth 2 (two) times daily as needed.     losartan (COZAAR) 100 MG tablet Take 100 mg by mouth daily.     metFORMIN (GLUCOPHAGE-XR) 500 MG 24 hr tablet TAKE ONE TABLET BY MOUTH TWICE DAILY     metoprolol succinate (TOPROL-XL) 100 MG 24 hr tablet TAKE ONE (1) TABLET EACH DAY     Misc. Devices MISC Underpad's 6 Bags/15-90 per month  Incontinence Pads 3 Bags/20-60 per month  2 Box of Gloves per month  40 box of gauze  20 rolls of tape  Nutrition supplement/30 day supply     mupirocin ointment (BACTROBAN) 2 % SMARTSIG:1 Application Topical 2-3 Times Daily     pantoprazole  (PROTONIX) 40 MG tablet TAKE 1 TABLET (40 MG TOTAL) BY MOUTH DAILY. (Patient taking differently: Take 40 mg by mouth as needed.) 90 tablet 0   polyethylene glycol (MIRALAX / GLYCOLAX) packet Take 17 g by mouth daily as needed for mild constipation. 14 each 0   predniSONE (DELTASONE) 20 MG tablet Take 20 mg by mouth daily with breakfast. As directed by PCP.     sodium chloride irrigation 0.9 % irrigation USE AS DIRECTED     sulfamethoxazole-trimethoprim (BACTRIM DS) 800-160 MG tablet Take 1 tablet by mouth 2 (two) times daily.     verapamil (CALAN-SR) 120 MG CR tablet Take 120 mg by mouth daily.     No current facility-administered medications for this visit.     PHYSICAL EXAMINATION: Performance status (ECOG): 1 - Symptomatic but completely ambulatory  Vitals:   01/20/22 1409  BP: 138/67  Pulse: 79  Resp: 20  Temp: 98.5 F (  36.9 C)  SpO2: 98%   Wt Readings from Last 3 Encounters:  01/20/22 254 lb 8 oz (115.4 kg)  04/01/21 285 lb 11.2 oz (129.6 kg)  01/10/21 289 lb (131.1 kg)   Physical Exam Vitals reviewed.  Constitutional:      Appearance: Normal appearance.     Comments: In wheelchair  Cardiovascular:     Rate and Rhythm: Normal rate and regular rhythm.     Pulses: Normal pulses.     Heart sounds: Murmur heard.     Systolic (ejection) murmur is present.  Pulmonary:     Effort: Pulmonary effort is normal.     Breath sounds: Normal breath sounds.  Neurological:     General: No focal deficit present.     Mental Status: She is alert and oriented to person, place, and time.  Psychiatric:        Mood and Affect: Mood normal.        Behavior: Behavior normal.     Breast Exam Chaperone: Thana Ates     LABORATORY DATA:  I have reviewed the data as listed    Latest Ref Rng & Units 01/13/2022   10:20 AM 09/30/2021    9:45 AM 04/01/2021    9:47 AM  CMP  Glucose 70 - 99 mg/dL 84  191  174   BUN 8 - 23 mg/dL _0 Creatinine 0.44 - 1.00 mg/dL 1.20  1.00   1.00   Sodium 135 - 145 mmol/L 137  136  136   Potassium 3.5 - 5.1 mmol/L 4.1  4.3  4.1   Chloride 98 - 111 mmol/L 101  102  102   CO2 22 - 32 mmol/L _1 Calcium 8.9 - 10.3 mg/dL 9.2  9.1  9.2   Total Protein 6.5 - 8.1 g/dL 8.1  7.8  7.7   Total Bilirubin 0.3 - 1.2 mg/dL 0.4  0.5  0.5   Alkaline Phos 38 - 126 U/L 79  81  82   AST 15 - 41 U/L _2 ALT 0 - 44 U/L _3 No results found for: "QZR007" Lab Results  Component Value Date   WBC 8.1 01/13/2022   HGB 9.0 (L) 01/13/2022   HCT 29.9 (L) 01/13/2022   MCV 85.2 01/13/2022   PLT 324 01/13/2022   NEUTROABS 5.9 01/13/2022    ASSESSMENT:  1. 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  She then underwent XRT by Dr. Lisbeth Renshaw (05/08/2015- 06/22/2015) with continuation of Herceptin x 52 weeks finishing on 02/07/2016. -Anastrozole started in May 2017, tolerating it very well.  Hot flashes minor or stable. -Mammogram on December 20, 2019, BI-RADS category   2.  Osteopenia: - DEXA scan on 06/12/2017 shows T score of -2.3 in the femoral neck.  Prior to that DEXA scan 2017 showed T score of -0.1. -Prolia started on September 18, 2017. -DEXA scan on December 20, 2019 with T score of -0.9.   3.  Normocytic anemia: -She is taking iron supplements.   PLAN:  1. 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  She will continue for 7  years. - Reviewed mammogram from 12/27/2021, BI-RADS Category 1. - Reviewed labs from 01/13/2022 which showed normal LFTs. - Continue anastrozole.   2.  Osteopenia: - Continue calcium and vitamin D supplements.  Calcium is 9.2, albumin 3.3.  Continue Prolia  every 6 months.  Vitamin D level is normal at 37.   3.  Normocytic anemia: - Anemia from chronic inflammation from hidradenitis flareups and mild CKD. - She is taking iron tablet twice daily without any problem. - Ferritin is 731, percent saturation is 10.  Hemoglobin is 9 and staying stable between 9 and 10.  Creatinine is 1.20. - She will continue iron tablet twice daily.  We discussed role of erythropoiesis stimulating agents if her hemoglobin drops below 8. - I plan to check her back in 6 months with repeat CBC, ferritin and iron panel.  We will also check folic acid, copper levels.  We will also check for SPEP, free light chains and immunofixation.  Breast Cancer therapy associated bone loss: I have recommended calcium, Vitamin D and weight bearing exercises.  Orders placed this encounter:  Orders Placed This Encounter  Procedures   CBC with Differential/Platelet   Comprehensive metabolic panel   Ferritin   Iron and TIBC   Folate   Copper, serum   Protein electrophoresis, serum   Kappa/lambda light chains   Immunofixation electrophoresis     The patient has a good understanding of the overall plan. She agrees with it. She will call with any problems that may develop before the next visit here.  Derek Jack, MD [place] [phone]

## 2022-03-12 ENCOUNTER — Other Ambulatory Visit: Payer: Self-pay | Admitting: *Deleted

## 2022-03-12 ENCOUNTER — Other Ambulatory Visit (HOSPITAL_COMMUNITY): Payer: Self-pay | Admitting: Hematology

## 2022-03-12 DIAGNOSIS — C50412 Malignant neoplasm of upper-outer quadrant of left female breast: Secondary | ICD-10-CM

## 2022-03-12 NOTE — Telephone Encounter (Signed)
Script for Anastrozole approved.  Patient tolerating and is to continue therapy.

## 2022-03-21 ENCOUNTER — Encounter (HOSPITAL_COMMUNITY): Payer: Self-pay | Admitting: Hematology

## 2022-03-28 ENCOUNTER — Ambulatory Visit: Payer: 59 | Attending: Medical | Admitting: Medical

## 2022-03-28 ENCOUNTER — Encounter: Payer: Self-pay | Admitting: Medical

## 2022-03-28 VITALS — BP 128/74 | HR 81 | Ht 64.0 in | Wt 255.0 lb

## 2022-03-28 DIAGNOSIS — I700 Atherosclerosis of aorta: Secondary | ICD-10-CM

## 2022-03-28 DIAGNOSIS — I5032 Chronic diastolic (congestive) heart failure: Secondary | ICD-10-CM

## 2022-03-28 DIAGNOSIS — I10 Essential (primary) hypertension: Secondary | ICD-10-CM | POA: Diagnosis not present

## 2022-03-28 DIAGNOSIS — I350 Nonrheumatic aortic (valve) stenosis: Secondary | ICD-10-CM

## 2022-03-28 NOTE — Progress Notes (Signed)
Cardiology Office Note:    Date:  03/28/2022   ID:  Kristy Harris, DOB [birth year], MRN [id number]  PCP:  Bridget Hartshorn, NP  [facility] Cardiologist:  None  [facility] Electrophysiologist:  None   Referring MD: Bridget Hartshorn, NP   Chief Complaint: 1 year follow-up  History of Present Illness:    Kristy Harris is a 77 y.o. female with a hx of diabetes, dyslipidemia, hypertension, history of breast cancer, 5 years cancer free, history of tobacco use, DJD, aortic atherosclerosis on prior CT who presents for 1 month follow-up.  Cardiac cath in 2012 showed normal coronaries and normal EF.  Echo in 2017 showed EF 60 to 65%, mild to moderate LVH, normal wall motion, grade 2 diastolic dysfunction, mild to moderate AAS, mild mitral valve stenosis.  She was last seen November 2022 for follow-up for valve disease.  An echocardiogram was ordered.  Echo showed normal LVEF, no WMA, moderate LVH, G1DD, normal RVSF, LVEDP, moderate Aortic valve stenosis.  Today, the patient reports no changes since the last visit. She has occasional palpitations. Metoprolol controls the symptoms. No chest pain. Occasional SOB when she walks. She uses a cane to walk. Occasional LLE. NO orthopnea or pnd. Most recent mammogram was OK.   Past Medical History:  Diagnosis Date   Anemia    takes iron supplement   Arthritis    knees   COPD (chronic obstructive pulmonary disease) (HCC)    Enlarged heart    GERD (gastroesophageal reflux disease)    Hidradenitis suppurativa    buttocks   History of breast cancer 12/2014   Hyperlipidemia    Hypertension    states under control with meds., has been on med. > 40 yrs.   Morbid obesity ([place])    Non-insulin dependent type 2 diabetes mellitus ([place])    Shortness of breath dyspnea    with exertion   Urinary frequency     Past Surgical History:  Procedure Laterality Date   ABDOMINAL HYSTERECTOMY  25 yrs ago   partial   BLADDER SUSPENSION     x  2   BREAST LUMPECTOMY WITH RADIOACTIVE SEED AND SENTINEL LYMPH NODE BIOPSY Left 12/25/2014   Procedure: RADIOACTIVE SEED GIUDED LEFT BREAST LUMPECTOMY, LEFT AXILLARY SENTINEL LYMPH NODE BIOPSY;  Surgeon: Alphonsa Overall, MD;  Location: [place] [place];  Service: General;  Laterality: Left;   CARDIAC CATHETERIZATION  02/21/2011   Normal coronary arteries, normal EF   COLONOSCOPY WITH PROPOFOL  11/18/2013   ESOPHAGOGASTRODUODENOSCOPY (EGD) WITH PROPOFOL  11/18/2013   EXCISION HYDRADENITIS LABIA N/A 12/08/2013   Procedure: EXCISION HIDRADENITIS PUBIC AREA;  Surgeon: Alphonsa Overall, MD;  Location: [facility];  Service: General;  Laterality: N/A;   HYDRADENITIS EXCISION Left 12/08/2013   Procedure: EXCISION HIDRADENITIS AXILLA;  Surgeon: Alphonsa Overall, MD;  Location: [facility];  Service: General;  Laterality: Left;   INCISION AND DRAINAGE ABSCESS  08/17/2008   perineum and buttock   IRRIGATION AND DEBRIDEMENT ABSCESS Right 12/23/2012   Procedure: incision  AND DEBRIDEMENT right buttock infection ;  Surgeon: Shann Medal, MD;  Location: [facility];  Service: General;  Laterality: Right;   PILONIDAL CYST / SINUS EXCISION  06/04/2004   PILONIDAL CYST EXCISION     PORT-A-CATH REMOVAL Right 03/21/2016   Procedure: MINOR REMOVAL PORT-A-CATH;  Surgeon: Alphonsa Overall, MD;  Location: [place];  Service: General;  Laterality: Right;  MINOR REMOVAL PORT-A-CATH   PORTACATH PLACEMENT N/A 01/12/2015   Procedure: INSERTION PORT-A-CATH;  Surgeon: Alphonsa Overall, MD;  Location: [facility];  Service: General;  Laterality: N/A;   TONSILLECTOMY      Current Medications: Current Meds  Medication Sig   acetaminophen (TYLENOL) 500 MG tablet Take 1,000 mg by mouth every 6 (six) hours as needed for mild pain or headache.   albuterol (PROVENTIL HFA;VENTOLIN HFA) 108 (90 BASE) MCG/ACT inhaler Inhale 2 puffs into the lungs every 4 (four) hours as needed for shortness of breath.   anastrozole (ARIMIDEX) 1 MG tablet TAKE 1 TABLET BY MOUTH  EVERY DAY   atorvastatin (LIPITOR) 20 MG tablet Take 20 mg by mouth every morning.   Blood Glucose Monitoring Suppl (BLOOD GLUCOSE MONITOR SYSTEM) w/Device KIT With lancets and strips #100 6rf Dx: E11.9 Test bid   Calcium 150 MG TABS Take 1 tablet by mouth daily.    cholecalciferol (VITAMIN D) 1000 units tablet Take 2,000 Units by mouth daily.   ferrous sulfate 325 (65 FE) MG tablet Take 325 mg by mouth 3 (three) times daily with meals.   fluconazole (DIFLUCAN) 150 MG tablet Take 150 mg by mouth as needed.    furosemide (LASIX) 20 MG tablet Take 20 mg by mouth once a week.   glipiZIDE (GLUCOTROL XL) 10 MG 24 hr tablet Take 10 mg by mouth daily with breakfast.    HYDROcodone-acetaminophen (NORCO/VICODIN) 5-325 MG tablet Take 1 tablet by mouth 2 (two) times daily as needed.   losartan (COZAAR) 100 MG tablet Take 100 mg by mouth daily.   metFORMIN (GLUCOPHAGE-XR) 500 MG 24 hr tablet TAKE ONE TABLET BY MOUTH TWICE DAILY   metoprolol succinate (TOPROL-XL) 100 MG 24 hr tablet TAKE ONE (1) TABLET EACH DAY   Misc. Devices MISC Underpad's 6 Bags/15-90 per month  Incontinence Pads 3 Bags/20-60 per month  2 Box of Gloves per month  40 box of gauze  20 rolls of tape  Nutrition supplement/30 day supply   mupirocin ointment (BACTROBAN) 2 % SMARTSIG:1 Application Topical 2-3 Times Daily   pantoprazole (PROTONIX) 40 MG tablet TAKE 1 TABLET (40 MG TOTAL) BY MOUTH DAILY. (Patient taking differently: Take 40 mg by mouth as needed.)   polyethylene glycol (MIRALAX / GLYCOLAX) packet Take 17 g by mouth daily as needed for mild constipation.   sodium chloride irrigation 0.9 % irrigation USE AS DIRECTED   sulfamethoxazole-trimethoprim (BACTRIM DS) 800-160 MG tablet Take 1 tablet by mouth 2 (two) times daily.   verapamil (CALAN-SR) 120 MG CR tablet Take 120 mg by mouth daily.     Allergies:   Lisinopril and Penicillins   Social History   Socioeconomic History   Marital status: Widowed    Spouse name:  Not on file   Number of children: 5   Years of education: Not on file   Highest education level: Not on file  Occupational History   Occupation: Textile work    Comment: Disabled  Tobacco Use   Smoking status: Former    Years: 0.00    Types: Cigarettes    Quit date: 03/03/1994    Years since quitting: 28.0   Smokeless tobacco: Never  Vaping Use   Vaping Use: Never used  Substance and Sexual Activity   Alcohol use: No   Drug use: No   Sexual activity: Not on file  Other Topics Concern   Not on file  Social History Narrative   Lives with grandson.  Widowed.  Has 2 sons and 3 daughters   Social Determinants of Health   Financial Resource Strain:  Not on file  Food Insecurity: Not on file  Transportation Needs: Not on file  Physical Activity: Not on file  Stress: Not on file  Social Connections: Not on file     Family History: The patient's family history includes Breast cancer in her cousin, maternal aunt, maternal grandmother, and sister; Breast cancer (age of onset: 83) in her sister; Dementia in her maternal aunt; Diabetes in her sister; Heart attack in her mother; Hypertension in her brother, daughter, and son; Lung cancer in her brother; Lung cancer (age of onset: 40) in her father; Prostate cancer in her brother; Throat cancer in her brother.  ROS:   Please see the history of present illness.     All other systems reviewed and are negative.  EKGs/Labs/Other Studies Reviewed:    The following studies were reviewed today:  Echo 03/2021  1. Left ventricular ejection fraction, by estimation, is 65 to 70%. The  left ventricle has normal function. The left ventricle has no regional  wall motion abnormalities. There is moderate concentric left ventricular  hypertrophy. Left ventricular  diastolic parameters are consistent with Grade I diastolic dysfunction  (impaired relaxation). Elevated left ventricular end-diastolic pressure.   2. Right ventricular systolic function is  normal. The right ventricular  size is normal. There is normal pulmonary artery systolic pressure. The  estimated right ventricular systolic pressure is 71.2 mmHg.   3. Left atrial size was upper normal.   4. The mitral valve is abnormal, moderate annular calcification. Trivial  mitral regurgitation.   5. The aortic valve is tricuspid. There is moderate calcification of the  aortic valve. Aortic valve regurgitation is not visualized. Moderate  aortic valve stenosis. Aortic valve mean gradient measures 20.0 mmHg.   6. The inferior vena cava is normal in size with greater than 50%  respiratory variability, suggesting right atrial pressure of 3 mmHg.   Comparison(s): No prior Echocardiogram.   EKG:  EKG is ordered today.  The ekg ordered today demonstrates NSR 81bpm, TWI III  Recent Labs: 01/13/2022: ALT 13; BUN 20; Creatinine, Ser 1.20; Hemoglobin 9.0; Platelets 324; Potassium 4.1; Sodium 137  Recent Lipid Panel No results found for: "CHOL", "TRIG", "HDL", "CHOLHDL", "VLDL", "LDLCALC", "LDLDIRECT"   Physical Exam:    VS:  BP 128/74   Pulse 81   Ht '5\' 4"'$  (1.626 m)   Wt 255 lb (115.7 kg)   SpO2 (!) 80%   BMI 43.77 kg/m     Wt Readings from Last 3 Encounters:  03/28/22 255 lb (115.7 kg)  01/20/22 254 lb 8 oz (115.4 kg)  04/01/21 285 lb 11.2 oz (129.6 kg)     GEN:  Well nourished, well developed in no acute distress HEENT: Normal NECK: No JVD; No carotid bruits LYMPHATICS: No lymphadenopathy CARDIAC: RRR, + murmur, no rubs, gallops RESPIRATORY:  Clear to auscultation without rales, wheezing or rhonchi  ABDOMEN: Soft, non-tender, non-distended MUSCULOSKELETAL:  No edema; No deformity  SKIN: Warm and dry NEUROLOGIC:  Alert and oriented x 3 PSYCHIATRIC:  Normal affect   ASSESSMENT:    1. Aortic atherosclerosis ([place])   2. Chronic diastolic heart failure ([place])   3. Nonrheumatic aortic valve stenosis   4. Morbid obesity ([place])   5. Primary hypertension    PLAN:    In  order of problems listed above:  Aortic Stenosis Diastolic dysfunction Echo from 2023 showed LVEF 65-70%, moderate Aortic stenosis. No signs of volume overload. She takes lasix '20mg'$  1-2 times weekly. I will order an echo  to assess valve progression. Continue Losartan and metoprool.  Morbid obesity Weight loss recommended.   DJD Patient uses a cane at times.  Aortic atherosclerosis Noted on prior CT scan. Continue statin management.   HTN BP is good today, continue current medications.  Disposition: Follow up in 1 year(s) with MD/APP    Signed, Era Parr Ninfa Meeker, PA-C  03/28/2022 1:28 PM    [facility]

## 2022-03-28 NOTE — Patient Instructions (Addendum)
Medication Instructions:  Your physician recommends that you continue on your current medications as directed. Please refer to the Current Medication list given to you today.   Labwork: None today  Testing/Procedures: Your physician has requested that you have an echocardiogram. Echocardiography is a painless test that uses sound waves to create images of your heart. It provides your doctor with information about the size and shape of your heart and how well your heart's chambers and valves are working. This procedure takes approximately one hour. There are no restrictions for this procedure. Please do NOT wear cologne, perfume, aftershave, or lotions (deodorant is allowed). Please arrive 15 minutes prior to your appointment time.   Follow-Up: 1 year  Any Other Special Instructions Will Be Listed Below (If Applicable).  If you need a refill on your cardiac medications before your next appointment, please call your pharmacy.

## 2022-04-11 ENCOUNTER — Ambulatory Visit (HOSPITAL_COMMUNITY)
Admission: RE | Admit: 2022-04-11 | Discharge: 2022-04-11 | Disposition: A | Discharge status: Home / Self Care | Payer: 59 | Source: Ambulatory Visit | Attending: Medical | Admitting: Medical

## 2022-04-11 DIAGNOSIS — I350 Nonrheumatic aortic (valve) stenosis: Secondary | ICD-10-CM

## 2022-04-11 LAB — ECHOCARDIOGRAM COMPLETE
AR max vel: 0.95 cm2
AV Area VTI: 0.96 cm2
AV Area mean vel: 0.91 cm2
AV Mean grad: 27 mmHg
AV Peak grad: 40.7 mmHg
Ao pk vel: 3.19 m/s
Area-P 1/2: 1.32 cm2
MV VTI: 1.26 cm2
S' Lateral: 2.1 cm

## 2022-04-11 MED ORDER — PERFLUTREN LIPID MICROSPHERE
1.0000 mL | INTRAVENOUS | Status: AC | PRN
  Administered 2022-04-11: 2 mL via INTRAVENOUS

## 2022-04-11 NOTE — Progress Notes (Signed)
  Echocardiogram 2D Echocardiogram has been performed.  Kristy Harris 04/11/2022, 2:19 PM

## 2022-04-22 ENCOUNTER — Telehealth: Payer: Self-pay

## 2022-04-22 DIAGNOSIS — I700 Atherosclerosis of aorta: Secondary | ICD-10-CM

## 2022-04-22 NOTE — Telephone Encounter (Signed)
-----   Message from Emily Filbert, RN sent at 04/22/2022 12:39 PM EST ----- Kristy Harris patient ----- Message ----- From: Antony Madura, PA-C Sent: 04/14/2022   4:08 PM EST To: [facility] Triage  Echo showed normal pump function, moderately thickened valve, impaired relaxation, moderate to severe aortic stenosis. We can re-check this in  months. Can we please make sure this is re-checked in 6 months

## 2022-04-22 NOTE — Telephone Encounter (Signed)
Patient notified and verbalized understanding. Patient had no questions or concerns at this time. PCP copied 

## 2022-07-14 ENCOUNTER — Inpatient Hospital Stay: Payer: 59 | Attending: Hematology

## 2022-07-14 DIAGNOSIS — C773 Secondary and unspecified malignant neoplasm of axilla and upper limb lymph nodes: Secondary | ICD-10-CM | POA: Insufficient documentation

## 2022-07-14 DIAGNOSIS — Z87891 Personal history of nicotine dependence: Secondary | ICD-10-CM | POA: Insufficient documentation

## 2022-07-14 DIAGNOSIS — D649 Anemia, unspecified: Secondary | ICD-10-CM | POA: Insufficient documentation

## 2022-07-14 DIAGNOSIS — C50412 Malignant neoplasm of upper-outer quadrant of left female breast: Secondary | ICD-10-CM

## 2022-07-14 DIAGNOSIS — Z803 Family history of malignant neoplasm of breast: Secondary | ICD-10-CM | POA: Insufficient documentation

## 2022-07-14 DIAGNOSIS — Z8042 Family history of malignant neoplasm of prostate: Secondary | ICD-10-CM | POA: Diagnosis not present

## 2022-07-14 DIAGNOSIS — Z801 Family history of malignant neoplasm of trachea, bronchus and lung: Secondary | ICD-10-CM | POA: Diagnosis not present

## 2022-07-14 DIAGNOSIS — M8580 Other specified disorders of bone density and structure, unspecified site: Secondary | ICD-10-CM | POA: Insufficient documentation

## 2022-07-14 DIAGNOSIS — D508 Other iron deficiency anemias: Secondary | ICD-10-CM

## 2022-07-14 DIAGNOSIS — R634 Abnormal weight loss: Secondary | ICD-10-CM | POA: Insufficient documentation

## 2022-07-14 DIAGNOSIS — Z170 Estrogen receptor positive status [ER+]: Secondary | ICD-10-CM | POA: Diagnosis not present

## 2022-07-14 DIAGNOSIS — C50912 Malignant neoplasm of unspecified site of left female breast: Secondary | ICD-10-CM | POA: Diagnosis present

## 2022-07-14 LAB — CBC WITH DIFFERENTIAL/PLATELET
Abs Immature Granulocytes: 0.04 10*3/uL (ref 0.00–0.07)
Basophils Absolute: 0 10*3/uL (ref 0.0–0.1)
Basophils Relative: 0 %
Eosinophils Absolute: 0.5 10*3/uL (ref 0.0–0.5)
Eosinophils Relative: 5 %
HCT: 28.1 % — ABNORMAL LOW (ref 36.0–46.0)
Hemoglobin: 8.5 g/dL — ABNORMAL LOW (ref 12.0–15.0)
Immature Granulocytes: 1 %
Lymphocytes Relative: 17 %
Lymphs Abs: 1.4 10*3/uL (ref 0.7–4.0)
MCH: 25.8 pg — ABNORMAL LOW (ref 26.0–34.0)
MCHC: 30.2 g/dL (ref 30.0–36.0)
MCV: 85.4 fL (ref 80.0–100.0)
Monocytes Absolute: 0.7 10*3/uL (ref 0.1–1.0)
Monocytes Relative: 9 %
Neutro Abs: 5.9 10*3/uL (ref 1.7–7.7)
Neutrophils Relative %: 68 %
Platelets: 370 10*3/uL (ref 150–400)
RBC: 3.29 MIL/uL — ABNORMAL LOW (ref 3.87–5.11)
RDW: 15.4 % (ref 11.5–15.5)
WBC: 8.5 10*3/uL (ref 4.0–10.5)
nRBC: 0 % (ref 0.0–0.2)

## 2022-07-14 LAB — COMPREHENSIVE METABOLIC PANEL
ALT: 14 U/L (ref 0–44)
AST: 17 U/L (ref 15–41)
Albumin: 2.9 g/dL — ABNORMAL LOW (ref 3.5–5.0)
Alkaline Phosphatase: 97 U/L (ref 38–126)
Anion gap: 11 (ref 5–15)
BUN: 16 mg/dL (ref 8–23)
CO2: 25 mmol/L (ref 22–32)
Calcium: 9.1 mg/dL (ref 8.9–10.3)
Chloride: 100 mmol/L (ref 98–111)
Creatinine, Ser: 0.85 mg/dL (ref 0.44–1.00)
GFR, Estimated: >60 mL/min (ref >60)
Glucose, Bld: 101 mg/dL — ABNORMAL HIGH (ref 70–99)
Potassium: 4.2 mmol/L (ref 3.5–5.1)
Sodium: 136 mmol/L (ref 135–145)
Total Bilirubin: 0.4 mg/dL (ref 0.3–1.2)
Total Protein: 7.7 g/dL (ref 6.5–8.1)

## 2022-07-14 LAB — IRON AND TIBC
Iron: 15 ug/dL — ABNORMAL LOW (ref 28–170)
Saturation Ratios: 8 % — ABNORMAL LOW (ref 10.4–31.8)
TIBC: 190 ug/dL — ABNORMAL LOW (ref 250–450)
UIBC: 175 ug/dL

## 2022-07-14 LAB — FOLATE: Folate: 6.5 ng/mL (ref >5.9)

## 2022-07-14 LAB — FERRITIN: Ferritin: 986 ng/mL — ABNORMAL HIGH (ref 11–307)

## 2022-07-15 LAB — COPPER, SERUM: Copper: 168 ug/dL — ABNORMAL HIGH (ref 80–158)

## 2022-07-17 LAB — PROTEIN ELECTROPHORESIS, SERUM
A/G Ratio: 0.6 — ABNORMAL LOW (ref 0.7–1.7)
Albumin ELP: 2.6 g/dL — ABNORMAL LOW (ref 2.9–4.4)
Alpha-1-Globulin: 0.5 g/dL — ABNORMAL HIGH (ref 0.0–0.4)
Alpha-2-Globulin: 1 g/dL (ref 0.4–1.0)
Beta Globulin: 1 g/dL (ref 0.7–1.3)
Gamma Globulin: 1.8 g/dL (ref 0.4–1.8)
Globulin, Total: 4.3 g/dL — ABNORMAL HIGH (ref 2.2–3.9)
Total Protein ELP: 6.9 g/dL (ref 6.0–8.5)

## 2022-07-18 LAB — KAPPA/LAMBDA LIGHT CHAINS
Kappa free light chain: 109.9 mg/L — ABNORMAL HIGH (ref 3.3–19.4)
Kappa, lambda light chain ratio: 1.53 (ref 0.26–1.65)
Lambda free light chains: 72 mg/L — ABNORMAL HIGH (ref 5.7–26.3)

## 2022-07-21 ENCOUNTER — Inpatient Hospital Stay (HOSPITAL_BASED_OUTPATIENT_CLINIC_OR_DEPARTMENT_OTHER): Payer: 59 | Admitting: Hematology

## 2022-07-21 VITALS — BP 123/56 | HR 85 | Temp 98.2°F | Resp 20 | Ht 64.0 in | Wt 232.9 lb

## 2022-07-21 DIAGNOSIS — D508 Other iron deficiency anemias: Secondary | ICD-10-CM

## 2022-07-21 DIAGNOSIS — C50412 Malignant neoplasm of upper-outer quadrant of left female breast: Secondary | ICD-10-CM

## 2022-07-21 DIAGNOSIS — C50912 Malignant neoplasm of unspecified site of left female breast: Secondary | ICD-10-CM | POA: Diagnosis not present

## 2022-07-21 NOTE — Progress Notes (Signed)
[facility] [address] [place] [address]    Clinic Day:  07/21/2022  Referring physician: Rebecka Apley, NP  Patient Care Team: Rebecka Apley, NP as PCP - General (Adult Health Nurse Practitioner) Cherlyn Roberts, MD as Consulting Physician (Dermatology) Rollene Rotunda, MD as Consulting Physician (Cardiology) Laurey Morale, MD as Consulting Physician (Cardiology) Dorothy Puffer, MD as Consulting Physician (Radiation Oncology) [name], MD as Consulting Physician (General Surgery) Galen Manila, Novella Olive, MD (Inactive) as Consulting Physician (Hematology and Oncology)   ASSESSMENT & PLAN:   Assessment: 1. 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  She then underwent XRT by Dr. Mitzi Hansen (05/08/2015- 06/22/2015) with continuation of Herceptin x 52 weeks finishing on 02/07/2016. -Anastrozole started in May 2017, tolerating it very well.  Hot flashes minor or stable. -Mammogram on December 20, 2019, BI-RADS category   2.  Osteopenia: - DEXA scan on 06/12/2017 shows T score of -2.3 in the femoral neck.  Prior to that DEXA scan 2017 showed T score of -0.1. -Prolia started on September 18, 2017. -DEXA scan on December 20, 2019 with T score of -0.9.   3.  Normocytic anemia: -She is taking iron supplements.    Plan: 1. Stage IIA (T1cN1) invasive ductal carcinoma of left breast, ER+/PR+/HER2+, with 1/1 sentinel lymph node for metastatic disease:  - She is tolerating anastrozole except for hot flashes. - She is continuing anastrozole beyond 5 years. - Last mammogram on 12/27/2021: BI-RADS Category 1. - Physical exam: No palpable masses in the breasts or adenopathy. - Will arrange for mammogram in October.   2.  Osteopenia: - Last vitamin D was 37.  Continue calcium and vitamin D supplements. - She  did not receive Prolia in January. - She will start back on it.   3.  Normocytic anemia: - Anemia from chronic inflammation from hidradenitis flareups and mild CKD. - She is taking iron tablet twice daily. - Reviewed labs from 07/14/2022.  Hemoglobin is 8.5.  Ferritin is 986 and percent saturation 8.  M spike was negative.  Free light chain ratio was normal. - Recommend cutting down iron tablet to once daily. - If hemoglobin falls below 8, consider starting ESA. - RTC 3 months with repeat CBC, ferritin and iron panel.  4.  Weight loss: - She lost about 18 pounds since November last year. - She reports decrease in appetite.  She is eating 2 meals per day. - Recommend start drinking Glucerna 1 can daily. - If continues to lose weight, consider imaging.    Orders Placed This Encounter  Procedures   CBC with Differential/Platelet    Standing Status:   Future    Standing Expiration Date:   07/21/2023    Order Specific Question:   Release to patient    Answer:   Immediate   Ferritin    Standing Status:   Future    Standing Expiration Date:   07/21/2023    Order Specific Question:   Release to patient    Answer:   Immediate   Iron and TIBC    Standing Status:   Future    Standing Expiration Date:   07/21/2023    Order Specific Question:   Release to patient    Answer:   Immediate      I,Kristy Harris,acting as a scribe for Kristy Massed, MD.,have documented all relevant  documentation on the behalf of Kristy Massed, MD,as directed by  Kristy Massed, MD while in the presence of Kristy Massed, MD.   I, Kristy Massed MD, have reviewed the above documentation for accuracy and completeness, and I agree with the above.   Kristy Massed, MD   5/20/20243:04 PM  CHIEF COMPLAINT:   Diagnosis: left breast cancer    Cancer Staging  Breast cancer of upper-outer quadrant of left female breast [facility]) Staging form: Breast, AJCC 7th Edition - Clinical  stage from 02/08/2015: Stage IIA (T1c, N1, M0) - Signed by Ellouise Newer, PA-C on 02/08/2015    Prior Therapy: 1. Left lumpectomy 12/25/14 2. Adjuvant chemotherapy with Abraxane/Herceptin (02/01/2015 - 04/19/2015)  3. XRT by Dr. Mitzi Hansen (05/08/2015- 06/22/2015)  4. Herceptin, completed 02/07/16  Current Therapy:  anastrozole   HISTORY OF PRESENT ILLNESS:   Oncology History  Breast cancer of upper-outer quadrant of left female breast (HCC)  11/01/2014 Mammogram   Possible mass in the left breast upper outer quadrant measuring 13 mm suspicious for breast cancer confirmed through ultrasound a spiculated hypoechoic mass ill-defined, no enlarged lymph nodes   11/01/2014 Initial Diagnosis   Invasive ductal carcinoma, moderately differentiated, 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 The estimated ejection   fraction was in the range of 55% to 60%.    12/25/2014 Surgery   Left lumpectomy: IDC 1.7 cm, positive for LVI, with DCIS, 1/1 sentinel node positive deposit 1.9 cm with extracapsular extension, ER 90%, PR 90%, HER-2 positive ratio 2.4, Ki-67 29% T1 cN1 stage II a   02/01/2015 - 04/19/2015 Chemotherapy   Abraxane/Herceptin   03/20/2015 Echocardiogram   Systolic function was normal. The estimated ejection fraction was in the range of 60%  to 65%.   05/08/2015 - 06/22/2015 Radiation Therapy   Dr. Mitzi Hansen    05/10/2015 - 02/07/2016 Antibody Plan   Herceptin every 21 days to complete 52 weeks worth of therapy.   06/13/2015 Echocardiogram   2D echo- The estimated ejection fraction was in the range of 60% to 65%. Diastolic function is abnormal, indeterminate grade. Wall motion was normal.   06/28/2015 Imaging   Bone density- BMD as determined from Femur Total Left is 0.994 g/cm2 with a T-Score of -0.1. This patient is considered normal according to World Health Organization [facility]) criteria.     07/13/2015 -  Anti-estrogen oral therapy   Arimidex daily   09/12/2015 Echocardiogram   The estimated ejection fraction was in the range of 60% to 65%. Wall motion was normal; there were no regional wall motion abnormalities. Doppler parameters are consistent with abnormal L ventricular relaxation (grade 1 diastolic dysfunction).   12/04/2015 Echocardiogram   Left ventricle: The cavity size was normal. Wall thickness was   increased increased in a pattern of mild to moderate LVH.   Systolic function was normal. The estimated ejection fraction was   in the range of 60% to 65%. Wall motion was normal; there were no   regional wall motion abnormalities. Features are consistent with   a pseudonormal left ventricular filling pattern, with concomitant   abnormal relaxation and increased filling pressure (grade 2   diastolic dysfunction). Doppler parameters are consistent with   high ventricular filling pressure.   03/21/2016 Procedure   Port removed by Dr. Ezzard Standing      INTERVAL HISTORY:   Kristy Harris is a 77 y.o.  female presenting to clinic today for follow up of left breast cancer. She was last seen by me on 01/20/22.  Today, she states that she is doing well overall. Her appetite level is at 75%. Her energy level is at 10%.  PAST MEDICAL HISTORY:   Past Medical History: Past Medical History:  Diagnosis Date   Anemia    takes iron supplement   Arthritis    knees   COPD (chronic obstructive pulmonary disease) (HCC)    Enlarged heart    GERD (gastroesophageal reflux disease)    Hidradenitis suppurativa    buttocks   History of breast cancer 12/2014   Hyperlipidemia    Hypertension    states under control with meds., has been on med. > 40 yrs.   Morbid obesity (HCC)    Non-insulin dependent type 2 diabetes mellitus (HCC)    Shortness of breath dyspnea    with exertion   Urinary frequency     Surgical History: Past Surgical History:  Procedure Laterality Date   ABDOMINAL  HYSTERECTOMY  25 yrs ago   partial   BLADDER SUSPENSION     x 2   BREAST LUMPECTOMY WITH RADIOACTIVE SEED AND SENTINEL LYMPH NODE BIOPSY Left 12/25/2014   Procedure: RADIOACTIVE SEED GIUDED LEFT BREAST LUMPECTOMY, LEFT AXILLARY SENTINEL LYMPH NODE BIOPSY;  Surgeon: [name], MD;  Location: [facility] OR;  Service: General;  Laterality: Left;   CARDIAC CATHETERIZATION  02/21/2011   Normal coronary arteries, normal EF   COLONOSCOPY WITH PROPOFOL  11/18/2013   ESOPHAGOGASTRODUODENOSCOPY (EGD) WITH PROPOFOL  11/18/2013   EXCISION HYDRADENITIS LABIA N/A 12/08/2013   Procedure: EXCISION HIDRADENITIS PUBIC AREA;  Surgeon: [name], MD;  Location: [facility];  Service: General;  Laterality: N/A;   HYDRADENITIS EXCISION Left 12/08/2013   Procedure: EXCISION HIDRADENITIS AXILLA;  Surgeon: [name], MD;  Location: [facility];  Service: General;  Laterality: Left;   INCISION AND DRAINAGE ABSCESS  08/17/2008   perineum and buttock   IRRIGATION AND DEBRIDEMENT ABSCESS Right 12/23/2012   Procedure: incision  AND DEBRIDEMENT right buttock infection ;  Surgeon: Kandis Cocking, MD;  Location: [facility];  Service: General;  Laterality: Right;   PILONIDAL CYST / SINUS EXCISION  06/04/2004   PILONIDAL CYST EXCISION     PORT-A-CATH REMOVAL Right 03/21/2016   Procedure: MINOR REMOVAL PORT-A-CATH;  Surgeon: [name], MD;  Location: [facility];  Service: General;  Laterality: Right;  MINOR REMOVAL PORT-A-CATH   PORTACATH PLACEMENT N/A 01/12/2015   Procedure: INSERTION PORT-A-CATH;  Surgeon: [name], MD;  Location: [facility];  Service: General;  Laterality: N/A;   TONSILLECTOMY      Social History: Social History   Socioeconomic History   Marital status: Widowed    Spouse name: Not on file   Number of children: 5   Years of education: Not on file   Highest education level: Not on file  Occupational History   Occupation: Textile work    Comment: Disabled  Tobacco Use   Smoking status: Former     Years: 0    Types: Cigarettes    Quit date: 03/03/1994    Years since quitting: 28.4   Smokeless tobacco: Never  Vaping Use   Vaping Use: Never used  Substance and Sexual Activity   Alcohol use: No   Drug use: No   Sexual activity: Not on file  Other Topics Concern   Not on file  Social History Narrative   Lives with grandson.  Widowed.  Has 2 sons and 3 daughters   Social Determinants of Corporate investment banker Strain: Not on file  Food Insecurity: Not on file  Transportation Needs: Not on file  Physical Activity: Not on file  Stress: Not on file  Social Connections: Not on file  Intimate Partner Violence: Not on file    Family History: Family History  Problem Relation Age of Onset   Heart attack Mother        had multiple health problems   Lung cancer Father 106       smoker   Prostate cancer Brother    Lung cancer Brother        dx. 6s; smoker   Throat cancer Brother        dx. 30s; smoker   Diabetes Sister        has 3 living sisters with multiple health problems   Hypertension Son    Hypertension Daughter    Breast cancer Sister 37   Breast cancer Sister        dx. 40s   Breast cancer Maternal Grandmother        dx. 70s   Breast cancer Maternal Aunt        dx. older than 50   Dementia Maternal Aunt    Breast cancer Cousin        dx. 50s   Hypertension Brother     Current Medications:  Current Outpatient Medications:    acetaminophen (TYLENOL) 500 MG tablet, Take 1,000 mg by mouth every 6 (six) hours as needed for mild pain or headache., Disp: , Rfl:    albuterol (PROVENTIL HFA;VENTOLIN HFA) 108 (90 BASE) MCG/ACT inhaler, Inhale 2 puffs into the lungs every 4 (four) hours as needed for shortness of breath., Disp: , Rfl:    anastrozole (ARIMIDEX) 1 MG tablet, TAKE 1 TABLET BY MOUTH EVERY DAY, Disp: 90 tablet, Rfl: 3   atorvastatin (LIPITOR) 20 MG tablet, Take 20 mg by mouth every morning., Disp: , Rfl:    Blood Glucose Monitoring Suppl (BLOOD  GLUCOSE MONITOR SYSTEM) w/Device KIT, With lancets and strips #100 [id number] Dx: E11.9 Test bid, Disp: , Rfl:    Calcium 150 MG TABS, Take 1 tablet by mouth daily. , Disp: , Rfl:    cholecalciferol (VITAMIN D) 1000 units tablet, Take 2,000 Units by mouth daily., Disp: , Rfl:    ferrous sulfate 325 (65 FE) MG tablet, Take 325 mg by mouth 3 (three) times daily with meals., Disp: , Rfl:    fluconazole (DIFLUCAN) 150 MG tablet, Take 150 mg by mouth as needed. , Disp: , Rfl:    furosemide (LASIX) 20 MG tablet, Take 20 mg by mouth once a week., Disp: , Rfl:    glipiZIDE (GLUCOTROL XL) 10 MG 24 hr tablet, Take 10 mg by mouth daily with breakfast. , Disp: , Rfl:    HYDROcodone-acetaminophen (NORCO/VICODIN) 5-325 MG tablet, Take 1 tablet by mouth 2 (two) times daily as needed., Disp: , Rfl:    losartan (COZAAR) 100 MG tablet, Take 100 mg by mouth daily., Disp: , Rfl:    metFORMIN (GLUCOPHAGE-XR) 500 MG 24 hr tablet, TAKE ONE TABLET BY MOUTH TWICE DAILY, Disp: , Rfl:    metoprolol succinate (TOPROL-XL) 100 MG 24 hr tablet, TAKE ONE (1) TABLET EACH DAY, Disp: , Rfl:    Misc. Devices MISC, Underpad's 6 Bags/15-90 per month  Incontinence Pads 3 Bags/20-60 per month  2 Box of Gloves per month  40 box of gauze  20 rolls of tape  Nutrition supplement/30 day supply, Disp: , Rfl:    mupirocin ointment (BACTROBAN) 2 %, SMARTSIG:1 Application Topical 2-3 Times Daily, Disp: , Rfl:    pantoprazole (PROTONIX) 40 MG tablet, TAKE 1 TABLET (40 MG TOTAL) BY MOUTH DAILY. (Patient taking differently: Take 40 mg by mouth as needed.), Disp: 90 tablet, Rfl: 0   polyethylene glycol (MIRALAX / GLYCOLAX) packet, Take 17 g by mouth daily as needed for mild constipation., Disp: 14 each, Rfl: 0   sodium chloride irrigation 0.9 % irrigation, USE AS DIRECTED, Disp: , Rfl:    sulfamethoxazole-trimethoprim (BACTRIM DS) 800-160 MG tablet, Take 1 tablet by mouth 2 (two) times daily., Disp: , Rfl:    verapamil (CALAN-SR) 120 MG CR tablet, Take 120  mg by mouth daily., Disp: , Rfl:    Allergies: Allergies  Allergen Reactions   Lisinopril Swelling and Cough    Mouth swelling   Penicillins Nausea And Vomiting and Rash    .Did it involve swelling of the face/tongue/throat, SOB, or low BP? Yes Did it involve sudden or severe rash/hives, skin peeling, or any reaction on the inside of your mouth or nose? Yes Did you need to seek medical attention at a hospital or doctor's office? Yes When did it last happen?       If all above answers are "NO", may proceed with cephalosporin use.     REVIEW OF SYSTEMS:   Review of Systems  Constitutional:  Negative for chills, fatigue and fever.  HENT:   Negative for lump/mass, mouth sores, nosebleeds, sore throat and trouble swallowing.   Eyes:  Negative for eye problems.  Respiratory:  Positive for shortness of breath. Negative for cough.   Cardiovascular:  Negative for chest pain, leg swelling and palpitations.  Gastrointestinal:  Negative for abdominal pain, constipation, diarrhea, nausea and vomiting.  Genitourinary:  Positive for frequency. Negative for bladder incontinence, difficulty urinating, dysuria, hematuria and nocturia.   Musculoskeletal:  Negative for arthralgias, back pain, flank pain, myalgias and neck pain.  Skin:  Negative for itching and rash.  Neurological:  Positive for dizziness. Negative for headaches and numbness.  Hematological:  Does not bruise/bleed easily.  Psychiatric/Behavioral:  Negative for depression, sleep disturbance and suicidal ideas. The patient is not nervous/anxious.   All other systems reviewed and are negative.    VITALS:   Blood pressure (!) 123/56, pulse 85, temperature 98.2 F (36.8 C), temperature source Oral, resp. rate 20, height 5\' 4"  (1.626 m), weight 232 lb 14.4 oz (105.6 kg), SpO2 96 %.  Wt Readings from Last 3 Encounters:  07/21/22 232 lb 14.4 oz (105.6 kg)  03/28/22 255 lb (115.7 kg)  01/20/22 254 lb 8 oz (115.4 kg)    Body mass index  is 39.98 kg/m.  Performance status (ECOG): 1 - Symptomatic but completely ambulatory  PHYSICAL EXAM:   Physical Exam Vitals and nursing note reviewed. Exam conducted with a chaperone present.  Constitutional:      Appearance: Normal appearance.  Cardiovascular:     Rate and Rhythm: Normal rate and regular rhythm.     Pulses: Normal pulses.     Heart sounds: Normal heart sounds.  Pulmonary:     Effort: Pulmonary effort is normal.     Breath sounds: Normal breath sounds.  Abdominal:     Palpations: Abdomen is soft. There is no hepatomegaly, splenomegaly or mass.     Tenderness: There is no abdominal tenderness.  Musculoskeletal:     Right lower leg:  No edema.     Left lower leg: No edema.  Lymphadenopathy:     Cervical: No cervical adenopathy.     Right cervical: No superficial, deep or posterior cervical adenopathy.    Left cervical: No superficial, deep or posterior cervical adenopathy.     Upper Body:     Right upper body: No supraclavicular or axillary adenopathy.     Left upper body: No supraclavicular or axillary adenopathy.  Neurological:     General: No focal deficit present.     Mental Status: She is alert and oriented to person, place, and time.  Psychiatric:        Mood and Affect: Mood normal.        Behavior: Behavior normal.     LABS:      Latest Ref Rng & Units 07/14/2022    1:37 PM 01/13/2022   10:20 AM 09/30/2021    9:45 AM  CBC  WBC 4.0 - 10.5 K/uL 8.5  8.1  8.5   Hemoglobin 12.0 - 15.0 g/dL 8.5  9.0  9.4   Hematocrit 36.0 - 46.0 % 28.1  29.9  30.7   Platelets 150 - 400 K/uL 370  324  319       Latest Ref Rng & Units 07/14/2022    1:37 PM 01/13/2022   10:20 AM 09/30/2021    9:45 AM  CMP  Glucose 70 - 99 mg/dL 161  84  096   BUN 8 - 23 mg/dL 16  20  17    Creatinine 0.44 - 1.00 mg/dL 0.45  4.09  8.11   Sodium 135 - 145 mmol/L 136  137  136   Potassium 3.5 - 5.1 mmol/L 4.2  4.1  4.3   Chloride 98 - 111 mmol/L 100  101  102   CO2 22 - 32 mmol/L  25  27  26    Calcium 8.9 - 10.3 mg/dL 9.1  9.2  9.1   Total Protein 6.5 - 8.1 g/dL 7.7  8.1  7.8   Total Bilirubin 0.3 - 1.2 mg/dL 0.4  0.4  0.5   Alkaline Phos 38 - 126 U/L 97  79  81   AST 15 - 41 U/L 17  12  12    ALT 0 - 44 U/L 14  13  10       No results found for: "CEA1", "CEA" / No results found for: "CEA1", "CEA" No results found for: "PSA1" No results found for: "CAN199" No results found for: "CAN125"  Lab Results  Component Value Date   TOTALPROTELP 6.9 07/14/2022   ALBUMINELP 2.6 (L) 07/14/2022   A1GS 0.5 (H) 07/14/2022   A2GS 1.0 07/14/2022   BETS 1.0 07/14/2022   GAMS 1.8 07/14/2022   MSPIKE Not Observed 07/14/2022   SPEI Comment 07/14/2022   Lab Results  Component Value Date   TIBC 190 (L) 07/14/2022   TIBC 245 (L) 01/13/2022   TIBC 284 01/16/2021   FERRITIN 986 (H) 07/14/2022   FERRITIN 731 (H) 01/13/2022   FERRITIN 476 (H) 01/16/2021   IRONPCTSAT 8 (L) 07/14/2022   IRONPCTSAT 10 (L) 01/13/2022   IRONPCTSAT 18 01/16/2021   Lab Results  Component Value Date   LDH 114 12/20/2019   LDH 122 07/19/2019   LDH 120 09/21/2018     STUDIES:   No results found.

## 2022-07-21 NOTE — Patient Instructions (Addendum)
[facility]  Discharge Instructions  You were seen and examined today by Dr. Ellin Saba.  Dr. Ellin Saba discussed your most recent lab work which revealed that your iron is high.  You can decrease the iron to once daily.   Follow-up as scheduled in 3 months.    Thank you for choosing [facility] to provide your oncology and hematology care.   To afford each patient quality time with our provider, please arrive at least 15 minutes before your scheduled appointment time. You may need to reschedule your appointment if you arrive late (10 or more minutes). Arriving late affects you and other patients whose appointments are after yours.  Also, if you miss three or more appointments without notifying the office, you may be dismissed from the clinic at the provider's discretion.    Again, thank you for choosing [facility].  Our hope is that these requests will decrease the amount of time that you wait before being seen by our physicians.   If you have a lab appointment with the Cancer Center - please note that after April 8th, all labs will be drawn in the cancer center.  You do not have to check in or register with the main entrance as you have in the past but will complete your check-in at the cancer center.            _____________________________________________________________  Should you have questions after your visit to [facility], please contact our office at [phone] and follow the prompts.  Our office hours are 8:00 a.m. to 4:30 p.m. Monday - Thursday and 8:00 a.m. to 2:30 p.m. Friday.  Please note that voicemails left after 4:00 p.m. may not be returned until the following business day.  We are closed weekends and all major holidays.  You do have access to a nurse 24-7, just call the main number to the clinic [phone] and do not press any options, hold on the line and a nurse will answer the  phone.    For prescription refill requests, have your pharmacy contact our office and allow 72 hours.    Masks are no longer required in the cancer centers. If you would like for your care team to wear a mask while they are taking care of you, please let them know. You may have one support person who is at least 75 years old accompany you for your appointments.

## 2022-07-23 LAB — IMMUNOFIXATION ELECTROPHORESIS
IgA: 421 mg/dL (ref 64–422)
IgG (Immunoglobin G), Serum: 1652 mg/dL — ABNORMAL HIGH (ref 586–1602)
IgM (Immunoglobulin M), Srm: 150 mg/dL (ref 26–217)
Total Protein ELP: 6.9 g/dL (ref 6.0–8.5)

## 2022-08-01 ENCOUNTER — Other Ambulatory Visit: Payer: Self-pay

## 2022-08-01 DIAGNOSIS — C50412 Malignant neoplasm of upper-outer quadrant of left female breast: Secondary | ICD-10-CM

## 2022-08-04 ENCOUNTER — Inpatient Hospital Stay: Payer: 59 | Attending: Hematology

## 2022-08-04 ENCOUNTER — Inpatient Hospital Stay: Payer: 59

## 2022-08-04 VITALS — BP 121/66 | HR 84 | Temp 98.7°F | Resp 20

## 2022-08-04 DIAGNOSIS — M8580 Other specified disorders of bone density and structure, unspecified site: Secondary | ICD-10-CM | POA: Diagnosis present

## 2022-08-04 DIAGNOSIS — C50912 Malignant neoplasm of unspecified site of left female breast: Secondary | ICD-10-CM | POA: Diagnosis not present

## 2022-08-04 DIAGNOSIS — Z170 Estrogen receptor positive status [ER+]: Secondary | ICD-10-CM | POA: Diagnosis not present

## 2022-08-04 DIAGNOSIS — M85852 Other specified disorders of bone density and structure, left thigh: Secondary | ICD-10-CM

## 2022-08-04 DIAGNOSIS — C50412 Malignant neoplasm of upper-outer quadrant of left female breast: Secondary | ICD-10-CM

## 2022-08-04 LAB — COMPREHENSIVE METABOLIC PANEL
ALT: 16 U/L (ref 0–44)
AST: 15 U/L (ref 15–41)
Albumin: 2.8 g/dL — ABNORMAL LOW (ref 3.5–5.0)
Alkaline Phosphatase: 98 U/L (ref 38–126)
Anion gap: 11 (ref 5–15)
BUN: 16 mg/dL (ref 8–23)
CO2: 23 mmol/L (ref 22–32)
Calcium: 9 mg/dL (ref 8.9–10.3)
Chloride: 99 mmol/L (ref 98–111)
Creatinine, Ser: 0.96 mg/dL (ref 0.44–1.00)
GFR, Estimated: >60 mL/min (ref >60)
Glucose, Bld: 156 mg/dL — ABNORMAL HIGH (ref 70–99)
Potassium: 4.3 mmol/L (ref 3.5–5.1)
Sodium: 133 mmol/L — ABNORMAL LOW (ref 135–145)
Total Bilirubin: 0.4 mg/dL (ref 0.3–1.2)
Total Protein: 7.7 g/dL (ref 6.5–8.1)

## 2022-08-04 MED ORDER — DENOSUMAB 60 MG/ML ~~LOC~~ SOSY
60.0000 mg | PREFILLED_SYRINGE | Freq: Once | SUBCUTANEOUS | Status: AC
  Administered 2022-08-04: 60 mg via SUBCUTANEOUS
  Filled 2022-08-04: qty 1

## 2022-08-04 NOTE — Patient Instructions (Signed)
[facility]  Discharge Instructions: Thank you for choosing [facility] to provide your oncology and hematology care.  If you have a lab appointment with the Cancer Center - please note that after April 8th, 2024, all labs will be drawn in the cancer center.  You do not have to check in or register with the main entrance as you have in the past but will complete your check-in in the cancer center.  Wear comfortable clothing and clothing appropriate for easy access to any Portacath or PICC line.   We strive to give you quality time with your provider. You may need to reschedule your appointment if you arrive late (15 or more minutes).  Arriving late affects you and other patients whose appointments are after yours.  Also, if you miss three or more appointments without notifying the office, you may be dismissed from the clinic at the provider's discretion.      For prescription refill requests, have your pharmacy contact our office and allow 72 hours for refills to be completed.    Today you received the following Prolia, return as scheduled.   To help prevent nausea and vomiting after your treatment, we encourage you to take your nausea medication as directed.  BELOW ARE SYMPTOMS THAT SHOULD BE REPORTED IMMEDIATELY: *FEVER GREATER THAN 100.4 F (38 C) OR HIGHER *CHILLS OR SWEATING *NAUSEA AND VOMITING THAT IS NOT CONTROLLED WITH YOUR NAUSEA MEDICATION *UNUSUAL SHORTNESS OF BREATH *UNUSUAL BRUISING OR BLEEDING *URINARY PROBLEMS (pain or burning when urinating, or frequent urination) *BOWEL PROBLEMS (unusual diarrhea, constipation, pain near the anus) TENDERNESS IN MOUTH AND THROAT WITH OR WITHOUT PRESENCE OF ULCERS (sore throat, sores in mouth, or a toothache) UNUSUAL RASH, SWELLING OR PAIN  UNUSUAL VAGINAL DISCHARGE OR ITCHING   Items with * indicate a potential emergency and should be followed up as soon as possible or go to the Emergency Department if  any problems should occur.  Please show the CHEMOTHERAPY ALERT CARD or IMMUNOTHERAPY ALERT CARD at check-in to the Emergency Department and triage nurse.  Should you have questions after your visit or need to cancel or reschedule your appointment, please contact [facility] [phone]  and follow the prompts.  Office hours are 8:00 a.m. to 4:30 p.m. Monday - Friday. Please note that voicemails left after 4:00 p.m. may not be returned until the following business day.  We are closed weekends and major holidays. You have access to a nurse at all times for urgent questions. Please call the main number to the clinic [phone] and follow the prompts.  For any non-urgent questions, you may also contact your provider using MyChart. We now offer e-Visits for anyone 18 and older to request care online for non-urgent symptoms. For details visit [email or web address].   Also download the MyChart app! Go to the app store, search "MyChart", open the app, select [place], and log in with your MyChart username and password.   

## 2022-08-04 NOTE — Progress Notes (Signed)
Patient taking calcium as directed. Denied tooth, jaw, and leg pain. No recent or upcoming dental visits. Labs reviewed. Patient tolerated injection with no complaints voiced. See MAR for details. Patient stable during and after injection. Site clean and dry with no bruising or swelling noted. Band aid applied. Vss with discharge and left in satisfactory condition with no s/s of distress.   

## 2022-10-20 ENCOUNTER — Inpatient Hospital Stay: Payer: 59 | Attending: Hematology

## 2022-10-20 DIAGNOSIS — Z79811 Long term (current) use of aromatase inhibitors: Secondary | ICD-10-CM | POA: Insufficient documentation

## 2022-10-20 DIAGNOSIS — C50412 Malignant neoplasm of upper-outer quadrant of left female breast: Secondary | ICD-10-CM | POA: Insufficient documentation

## 2022-10-20 DIAGNOSIS — N182 Chronic kidney disease, stage 2 (mild): Secondary | ICD-10-CM | POA: Diagnosis not present

## 2022-10-20 DIAGNOSIS — D649 Anemia, unspecified: Secondary | ICD-10-CM | POA: Diagnosis not present

## 2022-10-20 DIAGNOSIS — M8580 Other specified disorders of bone density and structure, unspecified site: Secondary | ICD-10-CM | POA: Insufficient documentation

## 2022-10-20 DIAGNOSIS — Z87891 Personal history of nicotine dependence: Secondary | ICD-10-CM | POA: Insufficient documentation

## 2022-10-20 DIAGNOSIS — Z170 Estrogen receptor positive status [ER+]: Secondary | ICD-10-CM | POA: Diagnosis not present

## 2022-10-20 DIAGNOSIS — Z79899 Other long term (current) drug therapy: Secondary | ICD-10-CM | POA: Diagnosis not present

## 2022-10-20 DIAGNOSIS — D508 Other iron deficiency anemias: Secondary | ICD-10-CM

## 2022-10-20 LAB — CBC WITH DIFFERENTIAL/PLATELET
Abs Immature Granulocytes: 0.04 10*3/uL (ref 0.00–0.07)
Basophils Absolute: 0 10*3/uL (ref 0.0–0.1)
Basophils Relative: 0 %
Eosinophils Absolute: 0.3 10*3/uL (ref 0.0–0.5)
Eosinophils Relative: 3 %
HCT: 25.3 % — ABNORMAL LOW (ref 36.0–46.0)
Hemoglobin: 7.6 g/dL — ABNORMAL LOW (ref 12.0–15.0)
Immature Granulocytes: 0 %
Lymphocytes Relative: 13 %
Lymphs Abs: 1.5 10*3/uL (ref 0.7–4.0)
MCH: 25.3 pg — ABNORMAL LOW (ref 26.0–34.0)
MCHC: 30 g/dL (ref 30.0–36.0)
MCV: 84.3 fL (ref 80.0–100.0)
Monocytes Absolute: 0.5 10*3/uL (ref 0.1–1.0)
Monocytes Relative: 4 %
Neutro Abs: 8.6 10*3/uL — ABNORMAL HIGH (ref 1.7–7.7)
Neutrophils Relative %: 80 %
Platelets: 356 10*3/uL (ref 150–400)
RBC: 3 MIL/uL — ABNORMAL LOW (ref 3.87–5.11)
RDW: 15.7 % — ABNORMAL HIGH (ref 11.5–15.5)
WBC: 11 10*3/uL — ABNORMAL HIGH (ref 4.0–10.5)
nRBC: 0 % (ref 0.0–0.2)

## 2022-10-20 LAB — IRON AND TIBC
Iron: 14 ug/dL — ABNORMAL LOW (ref 28–170)
Saturation Ratios: 7 % — ABNORMAL LOW (ref 10.4–31.8)
TIBC: 195 ug/dL — ABNORMAL LOW (ref 250–450)
UIBC: 181 ug/dL

## 2022-10-20 LAB — FERRITIN: Ferritin: 976 ng/mL — ABNORMAL HIGH (ref 11–307)

## 2022-10-21 ENCOUNTER — Ambulatory Visit (HOSPITAL_COMMUNITY)
Admission: RE | Admit: 2022-10-21 | Discharge: 2022-10-21 | Disposition: A | Discharge status: Home / Self Care | Payer: 59 | Source: Ambulatory Visit | Attending: Medical | Admitting: Medical

## 2022-10-21 DIAGNOSIS — I700 Atherosclerosis of aorta: Secondary | ICD-10-CM | POA: Insufficient documentation

## 2022-10-21 DIAGNOSIS — I350 Nonrheumatic aortic (valve) stenosis: Secondary | ICD-10-CM

## 2022-10-21 LAB — ECHOCARDIOGRAM COMPLETE
AR max vel: 1.1 cm2
AV Area VTI: 1.23 cm2
AV Area mean vel: 1.08 cm2
AV Mean grad: 32 mmHg
AV Peak grad: 55.4 mmHg
Ao pk vel: 3.72 m/s
Area-P 1/2: 2.05 cm2
S' Lateral: 3.1 cm

## 2022-10-21 NOTE — Progress Notes (Signed)
*  PRELIMINARY RESULTS* Echocardiogram 2D Echocardiogram has been performed.  Stacey Drain 10/21/2022, 12:22 PM

## 2022-10-27 ENCOUNTER — Inpatient Hospital Stay: Payer: 59 | Admitting: Hematology

## 2022-10-27 VITALS — BP 119/68 | HR 86 | Temp 98.6°F | Ht 64.0 in | Wt 220.1 lb

## 2022-10-27 DIAGNOSIS — D508 Other iron deficiency anemias: Secondary | ICD-10-CM | POA: Diagnosis not present

## 2022-10-27 DIAGNOSIS — C50412 Malignant neoplasm of upper-outer quadrant of left female breast: Secondary | ICD-10-CM | POA: Diagnosis not present

## 2022-10-27 NOTE — Addendum Note (Signed)
Addended by: Doreatha Massed on: 10/27/2022 04:38 PM   Modules accepted: Orders

## 2022-10-27 NOTE — Patient Instructions (Addendum)
[facility]  Discharge Instructions  You were seen and examined today by Dr. Ellin Saba.  Dr. Ellin Saba discussed your most recent lab work which revealed that your hemoglobin has went down to 7.6.  Increase your iron pills to two daily.  Dr. Ellin Saba is going to set you up for blood transfusion.  Follow-up as scheduled.    Thank you for choosing [facility] to provide your oncology and hematology care.   To afford each patient quality time with our provider, please arrive at least 15 minutes before your scheduled appointment time. You may need to reschedule your appointment if you arrive late (10 or more minutes). Arriving late affects you and other patients whose appointments are after yours.  Also, if you miss three or more appointments without notifying the office, you may be dismissed from the clinic at the provider's discretion.    Again, thank you for choosing [facility].  Our hope is that these requests will decrease the amount of time that you wait before being seen by our physicians.   If you have a lab appointment with the Cancer Center - please note that after April 8th, all labs will be drawn in the cancer center.  You do not have to check in or register with the main entrance as you have in the past but will complete your check-in at the cancer center.            _____________________________________________________________  Should you have questions after your visit to [facility], please contact our office at [phone] and follow the prompts.  Our office hours are 8:00 a.m. to 4:30 p.m. Monday - Thursday and 8:00 a.m. to 2:30 p.m. Friday.  Please note that voicemails left after 4:00 p.m. may not be returned until the following business day.  We are closed weekends and all major holidays.  You do have access to a nurse 24-7, just call the main number to the clinic [phone] and do not  press any options, hold on the line and a nurse will answer the phone.    For prescription refill requests, have your pharmacy contact our office and allow 72 hours.    Masks are no longer required in the cancer centers. If you would like for your care team to wear a mask while they are taking care of you, please let them know. You may have one support person who is at least 75 years old accompany you for your appointments.

## 2022-10-27 NOTE — Progress Notes (Signed)
[facility] [address] [place] [address]    Clinic Day:  10/27/2022  Referring physician: Rebecka Apley, NP  Patient Care Team: Rebecka Apley, NP as PCP - General (Adult Health Nurse Practitioner) Cherlyn Roberts, MD as Consulting Physician (Dermatology) Rollene Rotunda, MD as Consulting Physician (Cardiology) Laurey Morale, MD as Consulting Physician (Cardiology) Dorothy Puffer, MD as Consulting Physician (Radiation Oncology) [name], MD as Consulting Physician (General Surgery) Galen Manila, Novella Olive, MD (Inactive) as Consulting Physician (Hematology and Oncology)   ASSESSMENT & PLAN:   Assessment: 1. 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  She then underwent XRT by Dr. Mitzi Hansen (05/08/2015- 06/22/2015) with continuation of Herceptin x 52 weeks finishing on 02/07/2016. -Anastrozole started in May 2017, tolerating it very well.  Hot flashes minor or stable.   2.  Osteopenia: - DEXA scan on 06/12/2017 shows T score of -2.3 in the femoral neck.  Prior to that DEXA scan 2017 showed T score of -0.1. -Prolia started on September 18, 2017. -DEXA scan on December 20, 2019 with T score of -0.9.   3.  Normocytic anemia: -She is taking iron supplements.    Plan: 1. Stage IIA (T1cN1) invasive ductal carcinoma of left breast, ER+/PR+/HER2+, with 1/1 sentinel lymph node for metastatic disease:  - She is tolerating anastrozole except for occasional hot flashes. - She is continuing anastrozole beyond 5 years. - Mammogram on 12/27/2021 was BI-RADS Category 1.  She will have mammogram done in October.   2.  Osteopenia: - Last Prolia shot on 08/14/2022.  Continue calcium and vitamin D supplements.   3.  Normocytic anemia: - Anemia from chronic inflammation from hidradenitis flareups and mild CKD. -  She is currently taking iron tablet once daily.  Denies any bleeding per rectum or melena.  Has hidradenitis flareups. - Ferritin is 976, percent saturation 7.  Hemoglobin is 7.6, down from 8.5 in May. - Recommend 1 unit PRBC. - Recommend increasing iron tablet to twice daily. - Recommend NGS myeloid panel and serum EPO levels.  If myeloid panel shows significant mutations, will consider bone marrow biopsy. - RTC 3 months for follow-up with repeat labs.       Orders Placed This Encounter  Procedures   MM 3D SCREENING MAMMOGRAM BILATERAL BREAST    Standing Status:   Future    Standing Expiration Date:   10/27/2023    Order Specific Question:   Reason for Exam (SYMPTOM  OR DIAGNOSIS REQUIRED)    Answer:   screening for breast cancer    Order Specific Question:   Preferred imaging location?    Answer:   [facility]    Order Specific Question:   Release to patient    Answer:   Immediate   IntelliGEN Myeloid    Standing Status:   Future    Standing Expiration Date:   10/27/2023   Erythropoietin    Standing Status:   Future    Standing Expiration Date:   10/27/2023   CBC with Differential/Platelet    Standing Status:   Future    Standing Expiration Date:   10/27/2023    Order Specific Question:   Release to patient    Answer:   Immediate   Ferritin    Standing Status:   Future    Standing Expiration Date:   10/27/2023    Order  Specific Question:   Release to patient    Answer:   Immediate   Iron and TIBC    Standing Status:   Future    Standing Expiration Date:   10/27/2023    Order Specific Question:   Release to patient    Answer:   Immediate   Vitamin B12    Standing Status:   Future    Standing Expiration Date:   10/27/2023    Order Specific Question:   Release to patient    Answer:   Immediate   Lactate dehydrogenase    Standing Status:   Future    Standing Expiration Date:   10/27/2023    Order Specific Question:   Release to patient    Answer:   Immediate   Sample  to Blood Bank(Blood Bank Hold)    Standing Status:   Future    Standing Expiration Date:   10/27/2023      I,Edelmira Daubenspeck,acting as a scribe for Doreatha Massed, MD.,have documented all relevant documentation on the behalf of Doreatha Massed, MD,as directed by  Doreatha Massed, MD while in the presence of Doreatha Massed, MD.   I, Doreatha Massed MD, have reviewed the above documentation for accuracy and completeness, and I agree with the above.   Doreatha Massed, MD   8/26/20244:04 PM  CHIEF COMPLAINT:   Diagnosis: left breast cancer    Cancer Staging  Breast cancer of upper-outer quadrant of left female breast [facility]) Staging form: Breast, AJCC 7th Edition - Clinical stage from 02/08/2015: Stage IIA (T1c, N1, M0) - Signed by Ellouise Newer, PA-C on 02/08/2015    Prior Therapy: 1. Left lumpectomy 12/25/14 2. Adjuvant chemotherapy with Abraxane/Herceptin (02/01/2015 - 04/19/2015)  3. XRT by Dr. Mitzi Hansen (05/08/2015- 06/22/2015)  4. Herceptin, completed 02/07/16  Current Therapy:  anastrozole    HISTORY OF PRESENT ILLNESS:   Oncology History  Breast cancer of upper-outer quadrant of left female breast (HCC)  11/01/2014 Mammogram   Possible mass in the left breast upper outer quadrant measuring 13 mm suspicious for breast cancer confirmed through ultrasound a spiculated hypoechoic mass ill-defined, no enlarged lymph nodes   11/01/2014 Initial Diagnosis   Invasive ductal carcinoma, moderately differentiated, 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 The estimated ejection   fraction was in the range of 55% to 60%.    12/25/2014 Surgery   Left lumpectomy: IDC 1.7 cm, positive for LVI, with DCIS, 1/1 sentinel node positive deposit 1.9 cm with extracapsular extension, ER 90%, PR 90%, HER-2 positive ratio 2.4, Ki-67 29% T1 cN1 stage II a   02/01/2015 -  04/19/2015 Chemotherapy   Abraxane/Herceptin   03/20/2015 Echocardiogram   Systolic function was normal. The estimated ejection fraction was in the range of 60%  to 65%.   05/08/2015 - 06/22/2015 Radiation Therapy   Dr. Mitzi Hansen    05/10/2015 - 02/07/2016 Antibody Plan   Herceptin every 21 days to complete 52 weeks worth of therapy.   06/13/2015 Echocardiogram   2D echo- The estimated ejection fraction was in the range of 60% to 65%. Diastolic function is abnormal, indeterminate grade. Wall motion was normal.   06/28/2015 Imaging   Bone density- BMD as determined from Femur Total Left is 0.994 g/cm2 with a T-Score of -0.1. This patient is considered normal according to World Health Organization [facility]) criteria.    07/13/2015 -  Anti-estrogen oral therapy   Arimidex daily   09/12/2015 Echocardiogram   The estimated ejection fraction was in the range of 60% to 65%. Wall motion was normal; there were no regional wall motion abnormalities. Doppler parameters are consistent with abnormal L ventricular relaxation (grade 1 diastolic dysfunction).   12/04/2015 Echocardiogram   Left ventricle: The cavity size was normal. Wall thickness was   increased increased in a pattern of mild to moderate LVH.   Systolic function was normal. The estimated ejection fraction was   in the range of 60% to 65%. Wall motion was normal; there were no   regional wall motion abnormalities. Features are consistent with   a pseudonormal left ventricular filling pattern, with concomitant   abnormal relaxation and increased filling pressure (grade 2   diastolic dysfunction). Doppler parameters are consistent with   high ventricular filling pressure.   03/21/2016 Procedure   Port removed by Dr. Ezzard Standing      INTERVAL HISTORY:   Charla is a 77 y.o. female presenting to clinic today for follow up of left breast cancer. She was last seen by me on 07/21/22.  Today, she states that she is doing well overall. Her appetite level is at  65%. Her energy level is at 10%.  PAST MEDICAL HISTORY:   Past Medical History: Past Medical History:  Diagnosis Date   Anemia    takes iron supplement   Arthritis    knees   COPD (chronic obstructive pulmonary disease) (HCC)    Enlarged heart    GERD (gastroesophageal reflux disease)    Hidradenitis suppurativa    buttocks   History of breast cancer 12/2014   Hyperlipidemia    Hypertension    states under control with meds., has been on med. > 40 yrs.   Morbid obesity (HCC)    Non-insulin dependent type 2 diabetes mellitus (HCC)    Shortness of breath dyspnea    with exertion   Urinary frequency     Surgical History: Past Surgical History:  Procedure Laterality Date   ABDOMINAL HYSTERECTOMY  25 yrs ago   partial   BLADDER SUSPENSION     x 2   BREAST LUMPECTOMY WITH RADIOACTIVE SEED AND SENTINEL LYMPH NODE BIOPSY Left 12/25/2014   Procedure: RADIOACTIVE SEED GIUDED LEFT BREAST LUMPECTOMY, LEFT AXILLARY SENTINEL LYMPH NODE BIOPSY;  Surgeon: [name], MD;  Location: [facility] OR;  Service: General;  Laterality: Left;   CARDIAC CATHETERIZATION  02/21/2011   Normal coronary arteries, normal EF   COLONOSCOPY WITH PROPOFOL  11/18/2013   ESOPHAGOGASTRODUODENOSCOPY (EGD) WITH PROPOFOL  11/18/2013   EXCISION HYDRADENITIS LABIA N/A 12/08/2013   Procedure: EXCISION HIDRADENITIS PUBIC AREA;  Surgeon: [name], MD;  Location: [facility] ORS;  Service: General;  Laterality: N/A;   HYDRADENITIS EXCISION Left 12/08/2013   Procedure: EXCISION HIDRADENITIS AXILLA;  Surgeon: [name], MD;  Location: [facility]S;  Service: General;  Laterality: Left;   INCISION AND DRAINAGE ABSCESS  08/17/2008   perineum and buttock   IRRIGATION AND DEBRIDEMENT ABSCESS Right 12/23/2012   Procedure: incision  AND DEBRIDEMENT right buttock infection ;  Surgeon: Kandis Cocking, MD;  Location: [facility];  Service: General;  Laterality: Right;   PILONIDAL CYST / SINUS EXCISION  06/04/2004   PILONIDAL CYST EXCISION      PORT-A-CATH REMOVAL Right 03/21/2016   Procedure: MINOR REMOVAL PORT-A-CATH;  Surgeon: [name], MD;  Location: [facility];  Service: General;  Laterality: Right;  MINOR REMOVAL PORT-A-CATH   PORTACATH  PLACEMENT N/A 01/12/2015   Procedure: INSERTION PORT-A-CATH;  Surgeon: [name], MD;  Location: [facility];  Service: General;  Laterality: N/A;   TONSILLECTOMY      Social History: Social History   Socioeconomic History   Marital status: Widowed    Spouse name: Not on file   Number of children: 5   Years of education: Not on file   Highest education level: Not on file  Occupational History   Occupation: Textile work    Comment: Disabled  Tobacco Use   Smoking status: Former    Current packs/day: 0.00    Types: Cigarettes    Start date: 03/03/1994    Quit date: 03/03/1994    Years since quitting: 28.6   Smokeless tobacco: Never  Vaping Use   Vaping status: Never Used  Substance and Sexual Activity   Alcohol use: No   Drug use: No   Sexual activity: Not on file  Other Topics Concern   Not on file  Social History Narrative   Lives with grandson.  Widowed.  Has 2 sons and 3 daughters   Social Determinants of Health   Financial Resource Strain: Low Risk  (10/06/2022)   Received from [facility]   Overall Financial Resource Strain (CARDIA)    Difficulty of Paying Living Expenses: Not very hard  Food Insecurity: No Food Insecurity (10/06/2022)   Received from [facility]   Hunger Vital Sign    Worried About Running Out of Food in the Last Year: Never true    Ran Out of Food in the Last Year: Never true  Transportation Needs: No Transportation Needs (10/06/2022)   Received from [facility] - Transportation    Lack of Transportation (Medical): No    Lack of Transportation (Non-Medical): No  Physical Activity: Unknown (09/20/2021)   Received from [facility]   Exercise Vital Sign    Days of Exercise per Week: 0 days    Minutes of  Exercise per Session: Not on file  Stress: No Stress Concern Present (09/20/2021)   Received from [facility], [facility] - Occupational Stress Questionnaire    Feeling of Stress : Not at all  Social Connections: Unknown (09/26/2022)   Received from [facility]   Social Network    Social Network: Not on file  Intimate Partner Violence: Unknown (09/26/2022)   Received from [facility]   HITS    Physically Hurt: Not on file    Insult or Talk Down To: Not on file    Threaten Physical Harm: Not on file    Scream or Curse: Not on file    Family History: Family History  Problem Relation Age of Onset   Heart attack Mother        had multiple health problems   Lung cancer Father 53       smoker   Prostate cancer Brother    Lung cancer Brother        dx. 21s; smoker   Throat cancer Brother        dx. 30s; smoker   Diabetes Sister        has 3 living sisters with multiple health problems   Hypertension Son    Hypertension Daughter    Breast cancer Sister 67   Breast cancer Sister        dx. 40s   Breast cancer Maternal Grandmother        dx. 70s  Breast cancer Maternal Aunt        dx. older than 50   Dementia Maternal Aunt    Breast cancer Cousin        dx. 50s   Hypertension Brother     Current Medications:  Current Outpatient Medications:    acetaminophen (TYLENOL) 500 MG tablet, Take 1,000 mg by mouth every 6 (six) hours as needed for mild pain or headache., Disp: , Rfl:    albuterol (PROVENTIL HFA;VENTOLIN HFA) 108 (90 BASE) MCG/ACT inhaler, Inhale 2 puffs into the lungs every 4 (four) hours as needed for shortness of breath., Disp: , Rfl:    anastrozole (ARIMIDEX) 1 MG tablet, TAKE 1 TABLET BY MOUTH EVERY DAY, Disp: 90 tablet, Rfl: 3   atorvastatin (LIPITOR) 20 MG tablet, Take 20 mg by mouth every morning., Disp: , Rfl:    Blood Glucose Monitoring Suppl (BLOOD GLUCOSE MONITOR SYSTEM) w/Device KIT, With lancets and  strips #[id number] Dx: E11.9 Test bid, Disp: , Rfl:    Calcium 150 MG TABS, Take 1 tablet by mouth daily. , Disp: , Rfl:    cholecalciferol (VITAMIN D) 1000 units tablet, Take 2,000 Units by mouth daily., Disp: , Rfl:    ferrous sulfate 325 (65 FE) MG tablet, Take 325 mg by mouth 3 (three) times daily with meals., Disp: , Rfl:    fluconazole (DIFLUCAN) 150 MG tablet, Take 150 mg by mouth as needed. , Disp: , Rfl:    furosemide (LASIX) 20 MG tablet, Take 20 mg by mouth once a week., Disp: , Rfl:    glipiZIDE (GLUCOTROL XL) 10 MG 24 hr tablet, Take 10 mg by mouth daily with breakfast. , Disp: , Rfl:    HYDROcodone-acetaminophen (NORCO/VICODIN) 5-325 MG tablet, Take 1 tablet by mouth 2 (two) times daily as needed., Disp: , Rfl:    losartan (COZAAR) 100 MG tablet, Take 100 mg by mouth daily., Disp: , Rfl:    metFORMIN (GLUCOPHAGE-XR) 500 MG 24 hr tablet, TAKE ONE TABLET BY MOUTH TWICE DAILY, Disp: , Rfl:    metoprolol succinate (TOPROL-XL) 100 MG 24 hr tablet, TAKE ONE (1) TABLET EACH DAY, Disp: , Rfl:    Misc. Devices MISC, Underpad's 6 Bags/15-90 per month  Incontinence Pads 3 Bags/20-60 per month  2 Box of Gloves per month  40 box of gauze  20 rolls of tape  Nutrition supplement/30 day supply, Disp: , Rfl:    mupirocin ointment (BACTROBAN) 2 %, SMARTSIG:1 Application Topical 2-3 Times Daily, Disp: , Rfl:    pantoprazole (PROTONIX) 40 MG tablet, TAKE 1 TABLET (40 MG TOTAL) BY MOUTH DAILY. (Patient taking differently: Take 40 mg by mouth as needed.), Disp: 90 tablet, Rfl: 0   polyethylene glycol (MIRALAX / GLYCOLAX) packet, Take 17 g by mouth daily as needed for mild constipation., Disp: 14 each, Rfl: 0   sodium chloride irrigation 0.9 % irrigation, USE AS DIRECTED, Disp: , Rfl:    sulfamethoxazole-trimethoprim (BACTRIM DS) 800-160 MG tablet, Take 1 tablet by mouth 2 (two) times daily., Disp: , Rfl:    verapamil (CALAN-SR) 120 MG CR tablet, Take 120 mg by mouth daily., Disp: , Rfl:     Allergies: Allergies  Allergen Reactions   Lisinopril Swelling and Cough    Mouth swelling   Penicillins Nausea And Vomiting and Rash    .Did it involve swelling of the face/tongue/throat, SOB, or low BP? Yes Did it involve sudden or severe rash/hives, skin peeling, or any reaction on the inside of your mouth  or nose? Yes Did you need to seek medical attention at a hospital or doctor's office? Yes When did it last happen?       If all above answers are "NO", may proceed with cephalosporin use.     REVIEW OF SYSTEMS:   Review of Systems  Constitutional:  Negative for chills, fatigue and fever.  HENT:   Negative for lump/mass, mouth sores, nosebleeds, sore throat and trouble swallowing.   Eyes:  Negative for eye problems.  Respiratory:  Positive for shortness of breath. Negative for cough.   Cardiovascular:  Negative for chest pain, leg swelling and palpitations.  Gastrointestinal:  Negative for abdominal pain, constipation, diarrhea, nausea and vomiting.  Genitourinary:  Negative for bladder incontinence, difficulty urinating, dysuria, frequency, hematuria and nocturia.   Musculoskeletal:  Positive for back pain. Negative for arthralgias, flank pain, myalgias and neck pain.  Skin:  Negative for itching and rash.  Neurological:  Positive for dizziness. Negative for headaches and numbness.  Hematological:  Does not bruise/bleed easily.  Psychiatric/Behavioral:  Negative for depression, sleep disturbance and suicidal ideas. The patient is not nervous/anxious.   All other systems reviewed and are negative.    VITALS:   Blood pressure 119/68, pulse 86, temperature 98.6 F (37 C), temperature source Oral, height 5\' 4"  (1.626 m), weight 220 lb 1.6 oz (99.8 kg), SpO2 100%.  Wt Readings from Last 3 Encounters:  10/27/22 220 lb 1.6 oz (99.8 kg)  07/21/22 232 lb 14.4 oz (105.6 kg)  03/28/22 255 lb (115.7 kg)    Body mass index is 37.78 kg/m.  Performance status (ECOG): 1 -  Symptomatic but completely ambulatory  PHYSICAL EXAM:   Physical Exam Vitals and nursing note reviewed. Exam conducted with a chaperone present.  Constitutional:      Appearance: Normal appearance.  Cardiovascular:     Rate and Rhythm: Normal rate and regular rhythm.     Pulses: Normal pulses.     Heart sounds: Normal heart sounds.  Pulmonary:     Effort: Pulmonary effort is normal.     Breath sounds: Normal breath sounds.  Abdominal:     Palpations: Abdomen is soft. There is no hepatomegaly, splenomegaly or mass.     Tenderness: There is no abdominal tenderness.  Musculoskeletal:     Right lower leg: No edema.     Left lower leg: No edema.  Lymphadenopathy:     Cervical: No cervical adenopathy.     Right cervical: No superficial, deep or posterior cervical adenopathy.    Left cervical: No superficial, deep or posterior cervical adenopathy.     Upper Body:     Right upper body: No supraclavicular or axillary adenopathy.     Left upper body: No supraclavicular or axillary adenopathy.  Neurological:     General: No focal deficit present.     Mental Status: She is alert and oriented to person, place, and time.  Psychiatric:        Mood and Affect: Mood normal.        Behavior: Behavior normal.     LABS:      Latest Ref Rng & Units 10/20/2022   10:52 AM 07/14/2022    1:37 PM 01/13/2022   10:20 AM  CBC  WBC 4.0 - 10.5 K/uL 11.0  8.5  8.1   Hemoglobin 12.0 - 15.0 g/dL 7.6  8.5  9.0   Hematocrit 36.0 - 46.0 % 25.3  28.1  29.9   Platelets 150 - 400 K/uL 356  370  324       Latest Ref Rng & Units 08/04/2022   11:14 AM 07/14/2022    1:37 PM 01/13/2022   10:20 AM  CMP  Glucose 70 - 99 mg/dL 161  096  84   BUN 8 - 23 mg/dL 16  16  20    Creatinine 0.44 - 1.00 mg/dL 0.45  4.09  8.11   Sodium 135 - 145 mmol/L 133  136  137   Potassium 3.5 - 5.1 mmol/L 4.3  4.2  4.1   Chloride 98 - 111 mmol/L 99  100  101   CO2 22 - 32 mmol/L 23  25  27    Calcium 8.9 - 10.3 mg/dL 9.0  9.1   9.2   Total Protein 6.5 - 8.1 g/dL 7.7  7.7  8.1   Total Bilirubin 0.3 - 1.2 mg/dL 0.4  0.4  0.4   Alkaline Phos 38 - 126 U/L 98  97  79   AST 15 - 41 U/L 15  17  12    ALT 0 - 44 U/L 16  14  13       No results found for: "CEA1", "CEA" / No results found for: "CEA1", "CEA" No results found for: "PSA1" No results found for: "BJY782" No results found for: "CAN125"  Lab Results  Component Value Date   TOTALPROTELP 6.9 07/14/2022   TOTALPROTELP 6.9 07/14/2022   ALBUMINELP 2.6 (L) 07/14/2022   A1GS 0.5 (H) 07/14/2022   A2GS 1.0 07/14/2022   BETS 1.0 07/14/2022   GAMS 1.8 07/14/2022   MSPIKE Not Observed 07/14/2022   SPEI Comment 07/14/2022   Lab Results  Component Value Date   TIBC 195 (L) 10/20/2022   TIBC 190 (L) 07/14/2022   TIBC 245 (L) 01/13/2022   FERRITIN 976 (H) 10/20/2022   FERRITIN 986 (H) 07/14/2022   FERRITIN 731 (H) 01/13/2022   IRONPCTSAT 7 (L) 10/20/2022   IRONPCTSAT 8 (L) 07/14/2022   IRONPCTSAT 10 (L) 01/13/2022   Lab Results  Component Value Date   LDH 114 12/20/2019   LDH 122 07/19/2019   LDH 120 09/21/2018     STUDIES:   ECHOCARDIOGRAM COMPLETE  Result Date: 10/21/2022    ECHOCARDIOGRAM REPORT   Patient Name:   Kristy Harris Date of Exam: 10/21/2022 Medical Rec #:  [id number]       Height:       64.0 in Accession #:    [id number]      Weight:       232.9 lb Date of Birth:  1945-06-12       BSA:          2.087 m Patient Age:    76 years        BP:           106/63 mmHg Patient Gender: F               HR:           73 bpm. [facility] Procedure: 2D Echo, Cardiac Doppler and Color Doppler Indications:    Aortic valve stenosis [[id number]]  History:        Patient has prior history of Echocardiogram examinations, most                 recent 04/11/2022. Cardiomegaly, COPD, Aortic Valve Disease; Risk                 Factors:Hypertension, Diabetes and Dyslipidemia. Breast cancer  of upper-outer quadrant of left female breast (HCC).   Sonographer:    Celesta Gentile RCS Referring Phys: [id number] CADENCE H FURTH IMPRESSIONS  1. Left ventricular ejection fraction, by estimation, is 70 to 75%. The left ventricle has hyperdynamic function. The left ventricle has no regional wall motion abnormalities. There is moderate concentric left ventricular hypertrophy. Left ventricular diastolic parameters are consistent with Grade II diastolic dysfunction (pseudonormalization).  2. Right ventricular systolic function is normal. The right ventricular size is normal. There is normal pulmonary artery systolic pressure. The estimated right ventricular systolic pressure is 19.2 mmHg.  3. Left atrial size was moderately dilated.  4. Right atrial size was mildly dilated.  5. The mitral valve is degenerative. Trivial mitral valve regurgitation.  6. The aortic valve is tricuspid. There is moderate calcification of the aortic valve. Aortic valve regurgitation is not visualized. Moderate aortic valve stenosis. Aortic valve area, by VTI measures 1.23 cm. Aortic valve mean gradient measures 32.0 mmHg. Dimentionless index 0.39.  7. The inferior vena cava is normal in size with greater than 50% respiratory variability, suggesting right atrial pressure of 3 mmHg. Comparison(s): Prior images reviewed side by side. Aortic stenosis moderate range with mean AV gradient up to 32 mmHg and dimentionless index 0.39. FINDINGS  Left Ventricle: Left ventricular ejection fraction, by estimation, is 70 to 75%. The left ventricle has hyperdynamic function. The left ventricle has no regional wall motion abnormalities. The left ventricular internal cavity size was normal in size. There is moderate concentric left ventricular hypertrophy. Left ventricular diastolic parameters are consistent with Grade II diastolic dysfunction (pseudonormalization). Right Ventricle: The right ventricular size is normal. No increase in right ventricular wall thickness. Right ventricular systolic function is  normal. There is normal pulmonary artery systolic pressure. The tricuspid regurgitant velocity is 2.01 m/s, and  with an assumed right atrial pressure of 3 mmHg, the estimated right ventricular systolic pressure is 19.2 mmHg. Left Atrium: Left atrial size was moderately dilated. Right Atrium: Right atrial size was mildly dilated. Pericardium: There is no evidence of pericardial effusion. Mitral Valve: The mitral valve is degenerative in appearance. Mild to moderate mitral annular calcification. Trivial mitral valve regurgitation. Tricuspid Valve: The tricuspid valve is grossly normal. Tricuspid valve regurgitation is trivial. Aortic Valve: The aortic valve is tricuspid. There is moderate calcification of the aortic valve. There is mild aortic valve annular calcification. Aortic valve regurgitation is not visualized. Moderate aortic stenosis is present. Aortic valve mean gradient measures 32.0 mmHg. Aortic valve peak gradient measures 55.4 mmHg. Aortic valve area, by VTI measures 1.23 cm. Pulmonic Valve: The pulmonic valve was grossly normal. Pulmonic valve regurgitation is trivial. Aorta: The aortic root is normal in size and structure. Venous: The inferior vena cava is normal in size with greater than 50% respiratory variability, suggesting right atrial pressure of 3 mmHg. IAS/Shunts: No atrial level shunt detected by color flow Doppler.  LEFT VENTRICLE PLAX 2D LVIDd:         4.60 cm   Diastology LVIDs:         3.10 cm   LV e' medial:    10.00 cm/s LV PW:         1.30 cm   LV E/e' medial:  13.5 LV IVS:        1.50 cm   LV e' lateral:   4.79 cm/s LVOT diam:     2.00 cm   LV E/e' lateral: 28.2 LV SV:         101  LV SV Index:   48 LVOT Area:     3.14 cm  RIGHT VENTRICLE RV S prime:     17.30 cm/s TAPSE (M-mode): 2.7 cm LEFT ATRIUM              Index        RIGHT ATRIUM           Index LA diam:        4.90 cm  2.35 cm/m   RA Area:     23.90 cm LA Vol (A2C):   88.7 ml  42.50 ml/m  RA Volume:   75.40 ml  36.13 ml/m  LA Vol (A4C):   101.0 ml 48.39 ml/m LA Biplane Vol: 97.3 ml  46.62 ml/m  AORTIC VALVE AV Area (Vmax):    1.10 cm AV Area (Vmean):   1.08 cm AV Area (VTI):     1.23 cm AV Vmax:           372.00 cm/s AV Vmean:          265.000 cm/s AV VTI:            0.817 m AV Peak Grad:      55.4 mmHg AV Mean Grad:      32.0 mmHg LVOT Vmax:         130.00 cm/s LVOT Vmean:        91.400 cm/s LVOT VTI:          0.321 m LVOT/AV VTI ratio: 0.39  AORTA Ao Root diam: 3.50 cm MITRAL VALVE                TRICUSPID VALVE MV Area (PHT): 2.05 cm     TR Peak grad:   16.2 mmHg MV Decel Time: 370 msec     TR Vmax:        201.00 cm/s MV E velocity: 135.00 cm/s MV A velocity: 137.00 cm/s  SHUNTS MV E/A ratio:  0.99         Systemic VTI:  0.32 m                             Systemic Diam: 2.00 cm Nona Dell MD Electronically signed by Nona Dell MD Signature Date/Time: 10/21/2022/12:42:56 PM    Final

## 2022-10-28 ENCOUNTER — Other Ambulatory Visit: Payer: Self-pay

## 2022-10-28 ENCOUNTER — Other Ambulatory Visit: Payer: Self-pay | Admitting: *Deleted

## 2022-10-28 DIAGNOSIS — D508 Other iron deficiency anemias: Secondary | ICD-10-CM

## 2022-10-28 NOTE — Progress Notes (Signed)
Orders placed for labs needed for tomorrow.

## 2022-10-28 NOTE — Addendum Note (Signed)
Addended by: Mickie Bail on: 10/28/2022 10:55 AM   Modules accepted: Orders

## 2022-10-29 ENCOUNTER — Inpatient Hospital Stay: Payer: 59

## 2022-10-29 ENCOUNTER — Encounter: Payer: Self-pay | Admitting: *Deleted

## 2022-10-29 DIAGNOSIS — C50412 Malignant neoplasm of upper-outer quadrant of left female breast: Secondary | ICD-10-CM

## 2022-10-29 DIAGNOSIS — D508 Other iron deficiency anemias: Secondary | ICD-10-CM

## 2022-10-29 LAB — TYPE AND SCREEN
ABO/RH(D): B POS
Antibody Screen: NEGATIVE

## 2022-10-29 LAB — CBC WITH DIFFERENTIAL/PLATELET
Abs Immature Granulocytes: 0.04 10*3/uL (ref 0.00–0.07)
Basophils Absolute: 0 10*3/uL (ref 0.0–0.1)
Basophils Relative: 0 %
Eosinophils Absolute: 0.3 10*3/uL (ref 0.0–0.5)
Eosinophils Relative: 3 %
HCT: 27.6 % — ABNORMAL LOW (ref 36.0–46.0)
Hemoglobin: 8.2 g/dL — ABNORMAL LOW (ref 12.0–15.0)
Immature Granulocytes: 0 %
Lymphocytes Relative: 15 %
Lymphs Abs: 1.6 10*3/uL (ref 0.7–4.0)
MCH: 24.6 pg — ABNORMAL LOW (ref 26.0–34.0)
MCHC: 29.7 g/dL — ABNORMAL LOW (ref 30.0–36.0)
MCV: 82.9 fL (ref 80.0–100.0)
Monocytes Absolute: 0.6 10*3/uL (ref 0.1–1.0)
Monocytes Relative: 6 %
Neutro Abs: 7.8 10*3/uL — ABNORMAL HIGH (ref 1.7–7.7)
Neutrophils Relative %: 76 %
Platelets: 424 10*3/uL — ABNORMAL HIGH (ref 150–400)
RBC: 3.33 MIL/uL — ABNORMAL LOW (ref 3.87–5.11)
RDW: 15.6 % — ABNORMAL HIGH (ref 11.5–15.5)
WBC: 10.3 10*3/uL (ref 4.0–10.5)
nRBC: 0 % (ref 0.0–0.2)

## 2022-10-29 LAB — ABO/RH: ABO/RH(D): B POS

## 2022-10-29 NOTE — Progress Notes (Signed)
Patient advised of her test results.  She does not need blood transfusion.  We will cancel her appts for tomorrow. She is aware of her follow up in 3 months.

## 2022-10-30 ENCOUNTER — Inpatient Hospital Stay: Payer: 59

## 2022-10-30 LAB — ERYTHROPOIETIN: Erythropoietin: 28 m[IU]/mL — ABNORMAL HIGH (ref 2.6–18.5)

## 2022-11-18 LAB — INTELLIGEN MYELOID

## 2022-12-29 ENCOUNTER — Ambulatory Visit (HOSPITAL_COMMUNITY)
Admission: RE | Admit: 2022-12-29 | Discharge: 2022-12-29 | Disposition: A | Discharge status: Home / Self Care | Payer: 59 | Source: Ambulatory Visit | Attending: Hematology | Admitting: Hematology

## 2022-12-29 ENCOUNTER — Encounter (HOSPITAL_COMMUNITY): Payer: Self-pay

## 2022-12-29 DIAGNOSIS — C50412 Malignant neoplasm of upper-outer quadrant of left female breast: Secondary | ICD-10-CM | POA: Insufficient documentation

## 2022-12-29 DIAGNOSIS — Z1231 Encounter for screening mammogram for malignant neoplasm of breast: Secondary | ICD-10-CM | POA: Diagnosis present

## 2022-12-29 DIAGNOSIS — D508 Other iron deficiency anemias: Secondary | ICD-10-CM | POA: Diagnosis not present

## 2022-12-29 HISTORY — DX: Personal history of antineoplastic chemotherapy: Z92.21

## 2022-12-29 HISTORY — DX: Personal history of irradiation: Z92.3

## 2023-01-20 ENCOUNTER — Other Ambulatory Visit: Payer: 59

## 2023-01-20 ENCOUNTER — Inpatient Hospital Stay: Payer: 59 | Attending: Hematology

## 2023-01-20 DIAGNOSIS — D649 Anemia, unspecified: Secondary | ICD-10-CM | POA: Diagnosis not present

## 2023-01-20 DIAGNOSIS — C50412 Malignant neoplasm of upper-outer quadrant of left female breast: Secondary | ICD-10-CM | POA: Diagnosis present

## 2023-01-20 DIAGNOSIS — Z170 Estrogen receptor positive status [ER+]: Secondary | ICD-10-CM | POA: Insufficient documentation

## 2023-01-20 DIAGNOSIS — Z79899 Other long term (current) drug therapy: Secondary | ICD-10-CM | POA: Insufficient documentation

## 2023-01-20 DIAGNOSIS — M8580 Other specified disorders of bone density and structure, unspecified site: Secondary | ICD-10-CM | POA: Diagnosis not present

## 2023-01-20 DIAGNOSIS — D508 Other iron deficiency anemias: Secondary | ICD-10-CM

## 2023-01-20 LAB — CBC WITH DIFFERENTIAL/PLATELET
Abs Immature Granulocytes: 0.03 10*3/uL (ref 0.00–0.07)
Basophils Absolute: 0 10*3/uL (ref 0.0–0.1)
Basophils Relative: 0 %
Eosinophils Absolute: 0.3 10*3/uL (ref 0.0–0.5)
Eosinophils Relative: 3 %
HCT: 27.5 % — ABNORMAL LOW (ref 36.0–46.0)
Hemoglobin: 8 g/dL — ABNORMAL LOW (ref 12.0–15.0)
Immature Granulocytes: 0 %
Lymphocytes Relative: 20 %
Lymphs Abs: 1.6 10*3/uL (ref 0.7–4.0)
MCH: 24.7 pg — ABNORMAL LOW (ref 26.0–34.0)
MCHC: 29.1 g/dL — ABNORMAL LOW (ref 30.0–36.0)
MCV: 84.9 fL (ref 80.0–100.0)
Monocytes Absolute: 0.4 10*3/uL (ref 0.1–1.0)
Monocytes Relative: 6 %
Neutro Abs: 5.4 10*3/uL (ref 1.7–7.7)
Neutrophils Relative %: 71 %
Platelets: 326 10*3/uL (ref 150–400)
RBC: 3.24 MIL/uL — ABNORMAL LOW (ref 3.87–5.11)
RDW: 17.2 % — ABNORMAL HIGH (ref 11.5–15.5)
WBC: 7.7 10*3/uL (ref 4.0–10.5)
nRBC: 0 % (ref 0.0–0.2)

## 2023-01-20 LAB — IRON AND TIBC
Iron: 28 ug/dL (ref 28–170)
Saturation Ratios: 15 % (ref 10.4–31.8)
TIBC: 183 ug/dL — ABNORMAL LOW (ref 250–450)
UIBC: 155 ug/dL

## 2023-01-20 LAB — FERRITIN: Ferritin: 803 ng/mL — ABNORMAL HIGH (ref 11–307)

## 2023-01-20 LAB — VITAMIN B12: Vitamin B-12: 1046 pg/mL — ABNORMAL HIGH (ref 180–914)

## 2023-01-20 LAB — LACTATE DEHYDROGENASE: LDH: 83 U/L — ABNORMAL LOW (ref 98–192)

## 2023-01-27 ENCOUNTER — Inpatient Hospital Stay: Payer: 59 | Admitting: Hematology

## 2023-01-27 ENCOUNTER — Ambulatory Visit: Payer: 59 | Admitting: Hematology

## 2023-02-05 ENCOUNTER — Inpatient Hospital Stay: Payer: 59 | Admitting: Hematology

## 2023-02-05 ENCOUNTER — Inpatient Hospital Stay: Payer: 59 | Attending: Hematology | Admitting: Oncology

## 2023-02-05 VITALS — BP 115/56 | HR 80 | Temp 98.4°F | Resp 16 | Wt 204.4 lb

## 2023-02-05 DIAGNOSIS — C50412 Malignant neoplasm of upper-outer quadrant of left female breast: Secondary | ICD-10-CM | POA: Diagnosis not present

## 2023-02-05 DIAGNOSIS — Z170 Estrogen receptor positive status [ER+]: Secondary | ICD-10-CM | POA: Diagnosis not present

## 2023-02-05 DIAGNOSIS — Z79811 Long term (current) use of aromatase inhibitors: Secondary | ICD-10-CM | POA: Diagnosis not present

## 2023-02-05 DIAGNOSIS — C773 Secondary and unspecified malignant neoplasm of axilla and upper limb lymph nodes: Secondary | ICD-10-CM | POA: Diagnosis not present

## 2023-02-05 DIAGNOSIS — Z79899 Other long term (current) drug therapy: Secondary | ICD-10-CM | POA: Insufficient documentation

## 2023-02-05 DIAGNOSIS — M8580 Other specified disorders of bone density and structure, unspecified site: Secondary | ICD-10-CM | POA: Diagnosis present

## 2023-02-05 DIAGNOSIS — N189 Chronic kidney disease, unspecified: Secondary | ICD-10-CM | POA: Diagnosis not present

## 2023-02-05 DIAGNOSIS — D649 Anemia, unspecified: Secondary | ICD-10-CM | POA: Diagnosis not present

## 2023-02-05 NOTE — Progress Notes (Unsigned)
[facility] [address] [place] [address]  Clinic Day:  02/10/2023  Referring physician: Rebecka Apley, NP  Patient Care Team: Rebecka Apley, NP as PCP - General (Adult Health Nurse Practitioner) Cherlyn Roberts, MD as Consulting Physician (Dermatology) Rollene Rotunda, MD as Consulting Physician (Cardiology) Laurey Morale, MD as Consulting Physician (Cardiology) Dorothy Puffer, MD as Consulting Physician (Radiation Oncology) [name], MD as Consulting Physician (General Surgery) Galen Manila, Novella Olive, MD (Inactive) as Consulting Physician (Hematology and Oncology)   ASSESSMENT & PLAN:   Assessment: 1. 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  She then underwent XRT by Dr. Mitzi Hansen (05/08/2015- 06/22/2015) with continuation of Herceptin x 52 weeks finishing on 02/07/2016. -Anastrozole started in May 2017, tolerating it very well.  Hot flashes minor or stable.   2.  Osteopenia: - DEXA scan on 06/12/2017 shows T score of -2.3 in the femoral neck.  Prior to that DEXA scan 2017 showed T score of -0.1. -Prolia started on September 18, 2017. -DEXA scan on December 20, 2019 with T score of -0.9.   3.  Normocytic anemia: -She is taking iron supplements.  Plan: 1. Stage IIA (T1cN1) invasive ductal carcinoma of left breast, ER+/PR+/HER2+, with 1/1 sentinel lymph node for metastatic disease:  - She is tolerating anastrozole except for occasional hot flashes. - She is continuing anastrozole beyond 5 years. - Mammogram on 12/29/2022 was BI-RADS Category 1.  Next due in October 2025. -Repeat mammogram in 1 year.   2.  Osteopenia: - Last Prolia shot on 08/14/2022.   -She is due for next Prolia injection on 02/20/23. -Calcium level is WNL. -Continue calcium and vitamin D supplements.    3.   Normocytic anemia: - Anemia from chronic inflammation from hidradenitis flareups and mild CKD. - She is currently taking iron tablet once daily.  Denies any bleeding per rectum or melena.  Has hidradenitis flareups. - Labs from 01/20/2023 show a hemoglobin of 8.0 (8.2), iron saturation 15%, TIBC 183 and ferritin 803. -She received 1 unit of blood a few months ago.  -Her oral iron was increased from once to twice daily. 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  Discussed case with Dr. Anders Simmonds who recommended either initiation of Epogen versus bone marrow biopsy. -After reviewing patient's labs dating back to 2010 she has had anemia ranging from as low as 7.2-normal with baseline between 8 and 9. -EPO level was elevated at 28 likely indicating CKD.  Most recent creatinine was 0.96 with BUN of 16. -Given drop in hemoglobin, we recommended bone marrow biopsy but patient has declined.  Reports she has had a low hemoglobin all of her life.  She would like to continue oral iron and increase iron in her diet with repeat labs in a few months. -We also discussed initiation of Epo stimulating agent such as Retacrit or Procrit.  We will go ahead and preauthorize this and get it started when she returns in in a few weeks.  PLAN SUMMARY: >> Prolia today.  >> Preauth Retacrit.  If hemoglobin continues to be below 10, would recommend initiation of Retacrit at her next visit. >>Patient declined bone marrow biopsy at this time. >> Recommend she continue oral iron and follow-up in 3 months with labs a few  days and possible retacrit injection.      I spent 25 minutes dedicated to the care of this patient (face-to-face and non-face-to-face) on the date of the encounter to include what is described in the assessment and plan.    No orders of the defined types were placed in this  encounter.   Mauro Kaufmann, NP   12/10/202412:56 PM  CHIEF COMPLAINT:   Diagnosis: left breast cancer    Cancer Staging  Breast cancer of upper-outer quadrant of left female breast [facility]) Staging form: Breast, AJCC 7th Edition - Clinical stage from 02/08/2015: Stage IIA (T1c, N1, M0) - Signed by Ellouise Newer, PA-C on 02/08/2015    Prior Therapy: 1. Left lumpectomy 12/25/14 2. Adjuvant chemotherapy with Abraxane/Herceptin (02/01/2015 - 04/19/2015)  3. XRT by Dr. Mitzi Hansen (05/08/2015- 06/22/2015)  4. Herceptin, completed 02/07/16  Current Therapy:  anastrozole    HISTORY OF PRESENT ILLNESS:   Oncology History  Breast cancer of upper-outer quadrant of left female breast (HCC)  11/01/2014 Mammogram   Possible mass in the left breast upper outer quadrant measuring 13 mm suspicious for breast cancer confirmed through ultrasound a spiculated hypoechoic mass ill-defined, no enlarged lymph nodes   11/01/2014 Initial Diagnosis   Invasive ductal carcinoma, moderately differentiated, 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 The estimated ejection   fraction was in the range of 55% to 60%.    12/25/2014 Surgery   Left lumpectomy: IDC 1.7 cm, positive for LVI, with DCIS, 1/1 sentinel node positive deposit 1.9 cm with extracapsular extension, ER 90%, PR 90%, HER-2 positive ratio 2.4, Ki-67 29% T1 cN1 stage II a   02/01/2015 - 04/19/2015 Chemotherapy   Abraxane/Herceptin   03/20/2015 Echocardiogram   Systolic function was normal. The estimated ejection fraction was in the range of 60%  to 65%.   05/08/2015 - 06/22/2015 Radiation Therapy   Dr. Mitzi Hansen    05/10/2015 - 02/07/2016 Antibody Plan   Herceptin every 21 days to complete 52 weeks worth of therapy.   06/13/2015 Echocardiogram   2D echo- The estimated ejection fraction was in the range of 60% to 65%. Diastolic function is abnormal,  indeterminate grade. Wall motion was normal.   06/28/2015 Imaging   Bone density- BMD as determined from Femur Total Left is 0.994 g/cm2 with a T-Score of -0.1. This patient is considered normal according to World Health Organization [facility]) criteria.    07/13/2015 -  Anti-estrogen oral therapy   Arimidex daily   09/12/2015 Echocardiogram   The estimated ejection fraction was in the range of 60% to 65%. Wall motion was normal; there were no regional wall motion abnormalities. Doppler parameters are consistent with abnormal L ventricular relaxation (grade 1 diastolic dysfunction).   12/04/2015 Echocardiogram   Left ventricle: The cavity size was normal. Wall thickness was   increased increased in a pattern of mild to moderate LVH.   Systolic function was normal. The estimated ejection fraction was   in the range of 60% to 65%. Wall motion was normal; there were no   regional wall motion abnormalities. Features are consistent with   a pseudonormal left ventricular filling pattern, with concomitant   abnormal relaxation and increased filling pressure (grade 2   diastolic dysfunction). Doppler parameters are consistent with   high ventricular filling pressure.   03/21/2016 Procedure   Port removed by Dr. Ezzard Standing  INTERVAL HISTORY:   Kristy Harris is a 77 y.o. female presenting to clinic today for follow up of left breast cancer. She was last seen by me on 07/21/22.  Today, she states that she is doing well overall. Her appetite level is at 85%. Her energy level is at 70%.  Both of her knees have been bothering her.  Has to use a lift chair at home.  Uses a rolling walker and cane as needed for ambulation.  Reports she will likely need surgery although she is not wanting that at this time.  No bleeding. Weight loss d/t appetite change.     PAST MEDICAL HISTORY:   Past Medical History: Past Medical History:  Diagnosis Date   Anemia    takes iron supplement   Arthritis    knees   Breast  cancer (HCC) 2016   COPD (chronic obstructive pulmonary disease) (HCC)    Enlarged heart    GERD (gastroesophageal reflux disease)    Hidradenitis suppurativa    buttocks   History of breast cancer 12/2014   Hyperlipidemia    Hypertension    states under control with meds., has been on med. > 40 yrs.   Morbid obesity (HCC)    Non-insulin dependent type 2 diabetes mellitus (HCC)    Personal history of chemotherapy    Personal history of radiation therapy    Shortness of breath dyspnea    with exertion   Urinary frequency     Surgical History: Past Surgical History:  Procedure Laterality Date   ABDOMINAL HYSTERECTOMY  25 yrs ago   partial   BLADDER SUSPENSION     x 2   BREAST LUMPECTOMY Left 12/25/2014   BREAST LUMPECTOMY WITH RADIOACTIVE SEED AND SENTINEL LYMPH NODE BIOPSY Left 12/25/2014   Procedure: RADIOACTIVE SEED GIUDED LEFT BREAST LUMPECTOMY, LEFT AXILLARY SENTINEL LYMPH NODE BIOPSY;  Surgeon: [name], MD;  Location: [facility] OR;  Service: General;  Laterality: Left;   CARDIAC CATHETERIZATION  02/21/2011   Normal coronary arteries, normal EF   COLONOSCOPY WITH PROPOFOL  11/18/2013   ESOPHAGOGASTRODUODENOSCOPY (EGD) WITH PROPOFOL  11/18/2013   EXCISION HYDRADENITIS LABIA N/A 12/08/2013   Procedure: EXCISION HIDRADENITIS PUBIC AREA;  Surgeon: [name], MD;  Location: [facility] ORS;  Service: General;  Laterality: N/A;   HYDRADENITIS EXCISION Left 12/08/2013   Procedure: EXCISION HIDRADENITIS AXILLA;  Surgeon: [name], MD;  Location: [facility];  Service: General;  Laterality: Left;   INCISION AND DRAINAGE ABSCESS  08/17/2008   perineum and buttock   IRRIGATION AND DEBRIDEMENT ABSCESS Right 12/23/2012   Procedure: incision  AND DEBRIDEMENT right buttock infection ;  Surgeon: Kandis Cocking, MD;  Location: [facility];  Service: General;  Laterality: Right;   PILONIDAL CYST / SINUS EXCISION  06/04/2004   PILONIDAL CYST EXCISION     PORT-A-CATH REMOVAL Right 03/21/2016    Procedure: MINOR REMOVAL PORT-A-CATH;  Surgeon: [name], MD;  Location: [facility];  Service: General;  Laterality: Right;  MINOR REMOVAL PORT-A-CATH   PORTACATH PLACEMENT N/A 01/12/2015   Procedure: INSERTION PORT-A-CATH;  Surgeon: [name], MD;  Location: [facility];  Service: General;  Laterality: N/A;   TONSILLECTOMY      Social History: Social History   Socioeconomic History   Marital status: Widowed    Spouse name: Not on file   Number of children: 5   Years of education: Not on file   Highest education level: Not on file  Occupational History   Occupation: Textile work  Comment: Disabled  Tobacco Use   Smoking status: Former    Current packs/day: 0.00    Types: Cigarettes    Start date: 03/03/1994    Quit date: 03/03/1994    Years since quitting: 28.9   Smokeless tobacco: Never  Vaping Use   Vaping status: Never Used  Substance and Sexual Activity   Alcohol use: No   Drug use: No   Sexual activity: Not on file  Other Topics Concern   Not on file  Social History Narrative   Lives with grandson.  Widowed.  Has 2 sons and 3 daughters   Social Determinants of Health   Financial Resource Strain: High Risk (02/04/2023)   Received from [facility]   Overall Financial Resource Strain (CARDIA)    Difficulty of Paying Living Expenses: Very hard  Food Insecurity: No Food Insecurity (02/04/2023)   Received from [facility]   Hunger Vital Sign    Worried About Running Out of Food in the Last Year: Never true    Ran Out of Food in the Last Year: Never true  Transportation Needs: No Transportation Needs (02/04/2023)   Received from [facility] - [facility]    Lack of Transportation (Medical): No    Lack of Transportation (Non-Medical): No  Physical Activity: Unknown (02/04/2023)   Received from [facility]   Exercise Vital Sign    Days of Exercise per Week: 0 days    Minutes of Exercise per Session: Not on file  Stress: No Stress  Concern Present (02/04/2023)   Received from [facility] - Occupational Stress Questionnaire    Feeling of Stress : Not at all  Social Connections: Socially Integrated (02/04/2023)   Received from [facility]   Social Network    How would you rate your social network (family, work, friends)?: Good participation with social networks  Intimate Partner Violence: Not At Risk (02/04/2023)   Received from [facility]   HITS    Over the last 12 months how often did your partner physically hurt you?: Never    Over the last 12 months how often did your partner insult you or talk down to you?: Never    Over the last 12 months how often did your partner threaten you with physical harm?: Never    Over the last 12 months how often did your partner scream or curse at you?: Never    Family History: Family History  Problem Relation Age of Onset   Heart attack Mother        had multiple health problems   Lung cancer Father 57       smoker   Prostate cancer Brother    Lung cancer Brother        dx. 41s; smoker   Throat cancer Brother        dx. 30s; smoker   Diabetes Sister        has 3 living sisters with multiple health problems   Hypertension Son    Hypertension Daughter    Breast cancer Sister 46   Breast cancer Sister        dx. 40s   Breast cancer Maternal Grandmother        dx. 60s   Breast cancer Maternal Aunt        dx. older than 50   Dementia Maternal Aunt    Breast cancer Cousin        dx. 79s  Hypertension Brother     Current Medications:  Current Outpatient Medications:    acetaminophen (TYLENOL) 500 MG tablet, Take 1,000 mg by mouth every 6 (six) hours as needed for mild pain or headache., Disp: , Rfl:    albuterol (PROVENTIL HFA;VENTOLIN HFA) 108 (90 BASE) MCG/ACT inhaler, Inhale 2 puffs into the lungs every 4 (four) hours as needed for shortness of breath., Disp: , Rfl:    anastrozole (ARIMIDEX) 1 MG tablet, TAKE 1  TABLET BY MOUTH EVERY DAY, Disp: 90 tablet, Rfl: 3   atorvastatin (LIPITOR) 20 MG tablet, Take 20 mg by mouth every morning., Disp: , Rfl:    Blood Glucose Monitoring Suppl (BLOOD GLUCOSE MONITOR SYSTEM) w/Device KIT, With lancets and strips #[id number] Dx: E11.9 Test bid, Disp: , Rfl:    Calcium 150 MG TABS, Take 1 tablet by mouth daily. , Disp: , Rfl:    cholecalciferol (VITAMIN D) 1000 units tablet, Take 2,000 Units by mouth daily., Disp: , Rfl:    ferrous sulfate 325 (65 FE) MG tablet, Take 325 mg by mouth 3 (three) times daily with meals., Disp: , Rfl:    fluconazole (DIFLUCAN) 150 MG tablet, Take 150 mg by mouth as needed. , Disp: , Rfl:    furosemide (LASIX) 20 MG tablet, Take 20 mg by mouth once a week., Disp: , Rfl:    glipiZIDE (GLUCOTROL XL) 10 MG 24 hr tablet, Take 10 mg by mouth daily with breakfast. , Disp: , Rfl:    HYDROcodone-acetaminophen (NORCO/VICODIN) 5-325 MG tablet, Take 1 tablet by mouth 2 (two) times daily as needed., Disp: , Rfl:    losartan (COZAAR) 100 MG tablet, Take 100 mg by mouth daily., Disp: , Rfl:    metFORMIN (GLUCOPHAGE-XR) 500 MG 24 hr tablet, TAKE ONE TABLET BY MOUTH TWICE DAILY, Disp: , Rfl:    metoprolol succinate (TOPROL-XL) 100 MG 24 hr tablet, TAKE ONE (1) TABLET EACH DAY, Disp: , Rfl:    Misc. Devices MISC, Underpad's 6 Bags/15-90 per month  Incontinence Pads 3 Bags/20-60 per month  2 Box of Gloves per month  40 box of gauze  20 rolls of tape  Nutrition supplement/30 day supply, Disp: , Rfl:    mupirocin ointment (BACTROBAN) 2 %, SMARTSIG:1 Application Topical 2-3 Times Daily, Disp: , Rfl:    pantoprazole (PROTONIX) 40 MG tablet, TAKE 1 TABLET (40 MG TOTAL) BY MOUTH DAILY. (Patient taking differently: Take 40 mg by mouth as needed.), Disp: 90 tablet, Rfl: 0   polyethylene glycol (MIRALAX / GLYCOLAX) packet, Take 17 g by mouth daily as needed for mild constipation., Disp: 14 each, Rfl: 0   Secukinumab, 300 MG Dose, (COSENTYX, 300 MG DOSE,) 150 MG/ML SOSY,  Inject 300 mg into the skin every 30 (thirty) days., Disp: , Rfl:    sodium chloride irrigation 0.9 % irrigation, USE AS DIRECTED, Disp: , Rfl:    sulfamethoxazole-trimethoprim (BACTRIM DS) 800-160 MG tablet, Take 1 tablet by mouth 2 (two) times daily., Disp: , Rfl:    verapamil (CALAN-SR) 120 MG CR tablet, Take 120 mg by mouth daily., Disp: , Rfl:    Allergies: Allergies  Allergen Reactions   Lisinopril Swelling and Cough    Mouth swelling   Penicillins Nausea And Vomiting and Rash    .Did it involve swelling of the face/tongue/throat, SOB, or low BP? Yes Did it involve sudden or severe rash/hives, skin peeling, or any reaction on the inside of your mouth or nose? Yes Did you need to seek medical attention  at a hospital or doctor's office? Yes When did it last happen?       If all above answers are "NO", may proceed with cephalosporin use.     REVIEW OF SYSTEMS:   Review of Systems  Respiratory:  Positive for shortness of breath.   Cardiovascular:  Positive for palpitations.  Musculoskeletal:  Positive for arthralgias and back pain (knees and back).  Neurological:  Positive for dizziness.     VITALS:   Blood pressure (!) 115/56, pulse 80, temperature 98.4 F (36.9 C), temperature source Oral, resp. rate 16, weight 204 lb 6.4 oz (92.7 kg), SpO2 99%.  Wt Readings from Last 3 Encounters:  02/05/23 204 lb 6.4 oz (92.7 kg)  10/27/22 220 lb 1.6 oz (99.8 kg)  07/21/22 232 lb 14.4 oz (105.6 kg)    Body mass index is 35.09 kg/m.  Performance status (ECOG): 1 - Symptomatic but completely ambulatory  PHYSICAL EXAM:   Physical Exam Constitutional:      Appearance: Normal appearance.  Cardiovascular:     Rate and Rhythm: Normal rate and regular rhythm.  Pulmonary:     Effort: Pulmonary effort is normal.     Breath sounds: Normal breath sounds.  Abdominal:     General: Bowel sounds are normal.     Palpations: Abdomen is soft.  Musculoskeletal:        General: No swelling.  Normal range of motion.  Neurological:     Mental Status: She is alert and oriented to person, place, and time. Mental status is at baseline.     LABS:      Latest Ref Rng & Units 01/20/2023   10:42 AM 10/29/2022    8:48 AM 10/20/2022   10:52 AM  CBC  WBC 4.0 - 10.5 K/uL 7.7  10.3  11.0   Hemoglobin 12.0 - 15.0 g/dL 8.0  8.2  7.6   Hematocrit 36.0 - 46.0 % 27.5  27.6  25.3   Platelets 150 - 400 K/uL 326  424  356       Latest Ref Rng & Units 08/04/2022   11:14 AM 07/14/2022    1:37 PM 01/13/2022   10:20 AM  CMP  Glucose 70 - 99 mg/dL 440  102  84   BUN 8 - 23 mg/dL 16  16  20    Creatinine 0.44 - 1.00 mg/dL 7.25  3.66  4.40   Sodium 135 - 145 mmol/L 133  136  137   Potassium 3.5 - 5.1 mmol/L 4.3  4.2  4.1   Chloride 98 - 111 mmol/L 99  100  101   CO2 22 - 32 mmol/L 23  25  27    Calcium 8.9 - 10.3 mg/dL 9.0  9.1  9.2   Total Protein 6.5 - 8.1 g/dL 7.7  7.7  8.1   Total Bilirubin 0.3 - 1.2 mg/dL 0.4  0.4  0.4   Alkaline Phos 38 - 126 U/L 98  97  79   AST 15 - 41 U/L 15  17  12    ALT 0 - 44 U/L 16  14  13       No results found for: "CEA1", "CEA" / No results found for: "CEA1", "CEA" No results found for: "PSA1" No results found for: "HKV425" No results found for: "CAN125"  Lab Results  Component Value Date   TOTALPROTELP 6.9 07/14/2022   TOTALPROTELP 6.9 07/14/2022   ALBUMINELP 2.6 (L) 07/14/2022   A1GS 0.5 (H) 07/14/2022   A2GS 1.0 07/14/2022  BETS 1.0 07/14/2022   GAMS 1.8 07/14/2022   MSPIKE Not Observed 07/14/2022   SPEI Comment 07/14/2022   Lab Results  Component Value Date   TIBC 183 (L) 01/20/2023   TIBC 195 (L) 10/20/2022   TIBC 190 (L) 07/14/2022   FERRITIN 803 (H) 01/20/2023   FERRITIN 976 (H) 10/20/2022   FERRITIN 986 (H) 07/14/2022   IRONPCTSAT 15 01/20/2023   IRONPCTSAT 7 (L) 10/20/2022   IRONPCTSAT 8 (L) 07/14/2022   Lab Results  Component Value Date   LDH 83 (L) 01/20/2023   LDH 114 12/20/2019   LDH 122 07/19/2019     STUDIES:   No  results found.

## 2023-02-19 ENCOUNTER — Other Ambulatory Visit: Payer: Self-pay

## 2023-02-19 DIAGNOSIS — N189 Chronic kidney disease, unspecified: Secondary | ICD-10-CM

## 2023-02-19 DIAGNOSIS — D649 Anemia, unspecified: Secondary | ICD-10-CM

## 2023-02-20 ENCOUNTER — Inpatient Hospital Stay: Payer: 59

## 2023-02-20 VITALS — BP 117/54 | HR 74 | Temp 98.5°F | Resp 18

## 2023-02-20 DIAGNOSIS — D649 Anemia, unspecified: Secondary | ICD-10-CM

## 2023-02-20 DIAGNOSIS — N189 Chronic kidney disease, unspecified: Secondary | ICD-10-CM

## 2023-02-20 DIAGNOSIS — M8580 Other specified disorders of bone density and structure, unspecified site: Secondary | ICD-10-CM | POA: Diagnosis not present

## 2023-02-20 DIAGNOSIS — M85852 Other specified disorders of bone density and structure, left thigh: Secondary | ICD-10-CM

## 2023-02-20 LAB — CBC WITH DIFFERENTIAL/PLATELET
Abs Immature Granulocytes: 0.02 10*3/uL (ref 0.00–0.07)
Basophils Absolute: 0 10*3/uL (ref 0.0–0.1)
Basophils Relative: 0 %
Eosinophils Absolute: 0.2 10*3/uL (ref 0.0–0.5)
Eosinophils Relative: 3 %
HCT: 28.9 % — ABNORMAL LOW (ref 36.0–46.0)
Hemoglobin: 8.6 g/dL — ABNORMAL LOW (ref 12.0–15.0)
Immature Granulocytes: 0 %
Lymphocytes Relative: 23 %
Lymphs Abs: 1.8 10*3/uL (ref 0.7–4.0)
MCH: 25.7 pg — ABNORMAL LOW (ref 26.0–34.0)
MCHC: 29.8 g/dL — ABNORMAL LOW (ref 30.0–36.0)
MCV: 86.3 fL (ref 80.0–100.0)
Monocytes Absolute: 0.4 10*3/uL (ref 0.1–1.0)
Monocytes Relative: 6 %
Neutro Abs: 5.3 10*3/uL (ref 1.7–7.7)
Neutrophils Relative %: 68 %
Platelets: 356 10*3/uL (ref 150–400)
RBC: 3.35 MIL/uL — ABNORMAL LOW (ref 3.87–5.11)
RDW: 16.5 % — ABNORMAL HIGH (ref 11.5–15.5)
WBC: 7.8 10*3/uL (ref 4.0–10.5)
nRBC: 0 % (ref 0.0–0.2)

## 2023-02-20 LAB — COMPREHENSIVE METABOLIC PANEL
ALT: 9 U/L (ref 0–44)
AST: 10 U/L — ABNORMAL LOW (ref 15–41)
Albumin: 3 g/dL — ABNORMAL LOW (ref 3.5–5.0)
Alkaline Phosphatase: 79 U/L (ref 38–126)
Anion gap: 9 (ref 5–15)
BUN: 11 mg/dL (ref 8–23)
CO2: 25 mmol/L (ref 22–32)
Calcium: 9.4 mg/dL (ref 8.9–10.3)
Chloride: 101 mmol/L (ref 98–111)
Creatinine, Ser: 0.83 mg/dL (ref 0.44–1.00)
GFR, Estimated: >60 mL/min (ref >60)
Glucose, Bld: 136 mg/dL — ABNORMAL HIGH (ref 70–99)
Potassium: 4 mmol/L (ref 3.5–5.1)
Sodium: 135 mmol/L (ref 135–145)
Total Bilirubin: 0.5 mg/dL (ref <1.2)
Total Protein: 7.5 g/dL (ref 6.5–8.1)

## 2023-02-20 MED ORDER — DENOSUMAB 60 MG/ML ~~LOC~~ SOSY
60.0000 mg | PREFILLED_SYRINGE | Freq: Once | SUBCUTANEOUS | Status: AC
  Administered 2023-02-20: 60 mg via SUBCUTANEOUS
  Filled 2023-02-20: qty 1

## 2023-02-20 NOTE — Progress Notes (Signed)
Calcium today is WNL.  We will proceed with Prolia injection today per provider orders.    Patient tolerated injection with no complaints voiced.  Site clean and dry with no bruising or swelling noted.  No complaints of pain.  Discharged with vital signs stable and no signs or symptoms of distress noted.

## 2023-02-20 NOTE — Patient Instructions (Addendum)
[facility] [place] - A DEPT OF [facility]  Discharge Instructions: Thank you for choosing [facility] to provide your oncology and hematology care.  If you have a lab appointment with the Cancer Center - please note that after April 8th, 2024, all labs will be drawn in the cancer center.  You do not have to check in or register with the main entrance as you have in the past but will complete your check-in in the cancer center.  Wear comfortable clothing and clothing appropriate for easy access to any Portacath or PICC line.   We strive to give you quality time with your provider. You may need to reschedule your appointment if you arrive late (15 or more minutes).  Arriving late affects you and other patients whose appointments are after yours.  Also, if you miss three or more appointments without notifying the office, you may be dismissed from the clinic at the provider's discretion.      For prescription refill requests, have your pharmacy contact our office and allow 72 hours for refills to be completed.    Today you received the following:  Prolia.  Denosumab Injection (Osteoporosis) What is this medication? DENOSUMAB (den oh SUE mab) prevents and treats osteoporosis. It works by Interior and spatial designer stronger and less likely to break (fracture). It is a monoclonal antibody. This medicine may be used for other purposes; ask your health care provider or pharmacist if you have questions. COMMON BRAND NAME(S): Prolia What should I tell my care team before I take this medication? They need to know if you have any of these conditions: Dental or gum disease Had thyroid or parathyroid (glands located in neck) surgery Having dental surgery or a tooth pulled Kidney disease Low levels of calcium in the blood On dialysis Poor nutrition Thyroid disease Trouble absorbing nutrients from your food An unusual or allergic reaction to denosumab, other medications,  foods, dyes, or preservatives Pregnant or trying to get pregnant Breastfeeding How should I use this medication? This medication is injected under the skin. It is given by your care team in a hospital or clinic setting. A special MedGuide will be given to you before each treatment. Be sure to read this information carefully each time. Talk to your care team about the use of this medication in children. Special care may be needed. Overdosage: If you think you have taken too much of this medicine contact a poison control center or emergency room at once. NOTE: This medicine is only for you. Do not share this medicine with others. What if I miss a dose? Keep appointments for follow-up doses. It is important not to miss your dose. Call your care team if you are unable to keep an appointment. What may interact with this medication? Do not take this medication with any of the following: Other medications that contain denosumab This medication may also interact with the following: Medications that lower your chance of fighting infection Steroid medications, such as prednisone or cortisone This list may not describe all possible interactions. Give your health care provider a list of all the medicines, herbs, non-prescription drugs, or dietary supplements you use. Also tell them if you smoke, drink alcohol, or use illegal drugs. Some items may interact with your medicine. What should I watch for while using this medication? Your condition will be monitored carefully while you are receiving this medication. You may need blood work done while taking this medication. This medication  may increase your risk of getting an infection. Call your care team for advice if you get a fever, chills, sore throat, or other symptoms of a cold or flu. Do not treat yourself. Try to avoid being around people who are sick. Tell your dentist and dental surgeon that you are taking this medication. You should not have major  dental surgery while on this medication. See your dentist to have a dental exam and fix any dental problems before starting this medication. Take good care of your teeth while on this medication. Make sure you see your dentist for regular follow-up appointments. This medication may cause low levels of calcium in your body. The risk of severe side effects is increased in people with kidney disease. Your care team may prescribe calcium and vitamin D to help prevent low calcium levels while you take this medication. It is important to take calcium and vitamin D as directed by your care team. Talk to your care team if you may be pregnant. Serious birth defects may occur if you take this medication during pregnancy and for 5 months after the last dose. You will need a negative pregnancy test before starting this medication. Contraception is recommended while taking this medication and for 5 months after the last dose. Your care team can help you find the option that works for you. Talk to your care team before breastfeeding. Changes to your treatment plan may be needed. What side effects may I notice from receiving this medication? Side effects that you should report to your care team as soon as possible: Allergic reactions--skin rash, itching, hives, swelling of the face, lips, tongue, or throat Infection--fever, chills, cough, sore throat, wounds that don't heal, pain or trouble when passing urine, general feeling of discomfort or being unwell Low calcium level--muscle pain or cramps, confusion, tingling, or numbness in the hands or feet Osteonecrosis of the jaw--pain, swelling, or redness in the mouth, numbness of the jaw, poor healing after dental work, unusual discharge from the mouth, visible bones in the mouth Severe bone, joint, or muscle pain Skin infection--skin redness, swelling, warmth, or pain Side effects that usually do not require medical attention (report these to your care team if they  continue or are bothersome): Back pain Headache Joint pain Muscle pain Pain in the hands, arms, legs, or feet Runny or stuffy nose Sore throat This list may not describe all possible side effects. Call your doctor for medical advice about side effects. You may report side effects to FDA at 1-800-FDA-1088. Where should I keep my medication? This medication is given in a hospital or clinic. It will not be stored at home. NOTE: This sheet is a summary. It may not cover all possible information. If you have questions about this medicine, talk to your doctor, pharmacist, or health care provider.  2024 Elsevier/Gold Standard (2022-03-25 00:00:00)   To help prevent nausea and vomiting after your treatment, we encourage you to take your nausea medication as directed.  BELOW ARE SYMPTOMS THAT SHOULD BE REPORTED IMMEDIATELY: *FEVER GREATER THAN 100.4 F (38 C) OR HIGHER *CHILLS OR SWEATING *NAUSEA AND VOMITING THAT IS NOT CONTROLLED WITH YOUR NAUSEA MEDICATION *UNUSUAL SHORTNESS OF BREATH *UNUSUAL BRUISING OR BLEEDING *URINARY PROBLEMS (pain or burning when urinating, or frequent urination) *BOWEL PROBLEMS (unusual diarrhea, constipation, pain near the anus) TENDERNESS IN MOUTH AND THROAT WITH OR WITHOUT PRESENCE OF ULCERS (sore throat, sores in mouth, or a toothache) UNUSUAL RASH, SWELLING OR PAIN  UNUSUAL VAGINAL DISCHARGE OR ITCHING  Items with * indicate a potential emergency and should be followed up as soon as possible or go to the Emergency Department if any problems should occur.  Please show the CHEMOTHERAPY ALERT CARD or IMMUNOTHERAPY ALERT CARD at check-in to the Emergency Department and triage nurse.  Should you have questions after your visit or need to cancel or reschedule your appointment, please contact [facility] [place] - A DEPT OF [facility] [phone]  and follow the prompts.  Office hours are 8:00 a.m. to 4:30 p.m. Monday - Friday. Please  note that voicemails left after 4:00 p.m. may not be returned until the following business day.  We are closed weekends and major holidays. You have access to a nurse at all times for urgent questions. Please call the main number to the clinic [phone] and follow the prompts.  For any non-urgent questions, you may also contact your provider using MyChart. We now offer e-Visits for anyone 53 and older to request care online for non-urgent symptoms. For details visit [email or web address].   Also download the MyChart app! Go to the app store, search "MyChart", open the app, select [place], and log in with your MyChart username and password.

## 2023-03-01 ENCOUNTER — Other Ambulatory Visit: Payer: Self-pay | Admitting: Hematology

## 2023-03-01 DIAGNOSIS — C50412 Malignant neoplasm of upper-outer quadrant of left female breast: Secondary | ICD-10-CM

## 2023-03-02 ENCOUNTER — Encounter (HOSPITAL_COMMUNITY): Payer: Self-pay | Admitting: Hematology

## 2023-03-05 ENCOUNTER — Encounter (HOSPITAL_COMMUNITY): Payer: Self-pay | Admitting: Hematology

## 2023-04-02 ENCOUNTER — Other Ambulatory Visit: Payer: Self-pay

## 2023-04-02 DIAGNOSIS — D649 Anemia, unspecified: Secondary | ICD-10-CM

## 2023-04-02 DIAGNOSIS — D508 Other iron deficiency anemias: Secondary | ICD-10-CM

## 2023-04-02 DIAGNOSIS — C50412 Malignant neoplasm of upper-outer quadrant of left female breast: Secondary | ICD-10-CM

## 2023-04-03 ENCOUNTER — Encounter: Payer: Self-pay | Admitting: Internal Medicine

## 2023-04-03 ENCOUNTER — Ambulatory Visit: Payer: 59 | Attending: Internal Medicine | Admitting: Internal Medicine

## 2023-04-03 ENCOUNTER — Inpatient Hospital Stay: Payer: 59 | Attending: Hematology

## 2023-04-03 VITALS — BP 103/63 | HR 70 | Ht 63.0 in | Wt 198.8 lb

## 2023-04-03 DIAGNOSIS — D508 Other iron deficiency anemias: Secondary | ICD-10-CM

## 2023-04-03 DIAGNOSIS — I10 Essential (primary) hypertension: Secondary | ICD-10-CM

## 2023-04-03 DIAGNOSIS — N189 Chronic kidney disease, unspecified: Secondary | ICD-10-CM | POA: Diagnosis not present

## 2023-04-03 DIAGNOSIS — D631 Anemia in chronic kidney disease: Secondary | ICD-10-CM | POA: Insufficient documentation

## 2023-04-03 DIAGNOSIS — Z1731 Human epidermal growth factor receptor 2 positive status: Secondary | ICD-10-CM | POA: Insufficient documentation

## 2023-04-03 DIAGNOSIS — Z170 Estrogen receptor positive status [ER+]: Secondary | ICD-10-CM | POA: Diagnosis not present

## 2023-04-03 DIAGNOSIS — C50412 Malignant neoplasm of upper-outer quadrant of left female breast: Secondary | ICD-10-CM

## 2023-04-03 DIAGNOSIS — D649 Anemia, unspecified: Secondary | ICD-10-CM

## 2023-04-03 DIAGNOSIS — M8580 Other specified disorders of bone density and structure, unspecified site: Secondary | ICD-10-CM | POA: Insufficient documentation

## 2023-04-03 DIAGNOSIS — Z1721 Progesterone receptor positive status: Secondary | ICD-10-CM | POA: Insufficient documentation

## 2023-04-03 DIAGNOSIS — I350 Nonrheumatic aortic (valve) stenosis: Secondary | ICD-10-CM | POA: Diagnosis not present

## 2023-04-03 DIAGNOSIS — C50912 Malignant neoplasm of unspecified site of left female breast: Secondary | ICD-10-CM | POA: Diagnosis present

## 2023-04-03 LAB — COMPREHENSIVE METABOLIC PANEL
ALT: 10 U/L (ref 0–44)
AST: 10 U/L — ABNORMAL LOW (ref 15–41)
Albumin: 3.2 g/dL — ABNORMAL LOW (ref 3.5–5.0)
Alkaline Phosphatase: 77 U/L (ref 38–126)
Anion gap: 10 (ref 5–15)
BUN: 13 mg/dL (ref 8–23)
CO2: 24 mmol/L (ref 22–32)
Calcium: 8.8 mg/dL — ABNORMAL LOW (ref 8.9–10.3)
Chloride: 104 mmol/L (ref 98–111)
Creatinine, Ser: 0.75 mg/dL (ref 0.44–1.00)
GFR, Estimated: >60 mL/min (ref >60)
Glucose, Bld: 111 mg/dL — ABNORMAL HIGH (ref 70–99)
Potassium: 4.6 mmol/L (ref 3.5–5.1)
Sodium: 138 mmol/L (ref 135–145)
Total Bilirubin: 0.4 mg/dL (ref 0.0–1.2)
Total Protein: 7.8 g/dL (ref 6.5–8.1)

## 2023-04-03 LAB — CBC WITH DIFFERENTIAL/PLATELET
Abs Immature Granulocytes: 0.02 10*3/uL (ref 0.00–0.07)
Basophils Absolute: 0 10*3/uL (ref 0.0–0.1)
Basophils Relative: 0 %
Eosinophils Absolute: 0.2 10*3/uL (ref 0.0–0.5)
Eosinophils Relative: 3 %
HCT: 30.6 % — ABNORMAL LOW (ref 36.0–46.0)
Hemoglobin: 9 g/dL — ABNORMAL LOW (ref 12.0–15.0)
Immature Granulocytes: 0 %
Lymphocytes Relative: 26 %
Lymphs Abs: 1.8 10*3/uL (ref 0.7–4.0)
MCH: 25.2 pg — ABNORMAL LOW (ref 26.0–34.0)
MCHC: 29.4 g/dL — ABNORMAL LOW (ref 30.0–36.0)
MCV: 85.7 fL (ref 80.0–100.0)
Monocytes Absolute: 0.5 10*3/uL (ref 0.1–1.0)
Monocytes Relative: 7 %
Neutro Abs: 4.3 10*3/uL (ref 1.7–7.7)
Neutrophils Relative %: 64 %
Platelets: 320 10*3/uL (ref 150–400)
RBC: 3.57 MIL/uL — ABNORMAL LOW (ref 3.87–5.11)
RDW: 15.8 % — ABNORMAL HIGH (ref 11.5–15.5)
WBC: 6.8 10*3/uL (ref 4.0–10.5)
nRBC: 0 % (ref 0.0–0.2)

## 2023-04-03 LAB — IRON AND TIBC
Iron: 25 ug/dL — ABNORMAL LOW (ref 28–170)
Saturation Ratios: 12 % (ref 10.4–31.8)
TIBC: 207 ug/dL — ABNORMAL LOW (ref 250–450)
UIBC: 182 ug/dL

## 2023-04-03 NOTE — Patient Instructions (Signed)
Medication Instructions:  Your physician recommends that you continue on your current medications as directed. Please refer to the Current Medication list given to you today.   Labwork: None today  Testing/Procedures: Your physician has requested that you have an echocardiogram in [place]. Echocardiography is a painless test that uses sound waves to create images of your heart. It provides your doctor with information about the size and shape of your heart and how well your heart's chambers and valves are working. This procedure takes approximately one hour. There are no restrictions for this procedure. Please do NOT wear cologne, perfume, aftershave, or lotions (deodorant is allowed). Please arrive 15 minutes prior to your appointment time.  Please note: We ask at that you not bring children with you during ultrasound (echo/ vascular) testing. Due to room size and safety concerns, children are not allowed in the ultrasound rooms during exams. Our front office staff cannot provide observation of children in our lobby area while testing is being conducted. An adult accompanying a patient to their appointment will only be allowed in the ultrasound room at the discretion of the ultrasound technician under special circumstances. We apologize for any inconvenience.   Follow-Up: 1 year  Any Other Special Instructions Will Be Listed Below (If Applicable).  If you need a refill on your cardiac medications before your next appointment, please call your pharmacy.

## 2023-04-03 NOTE — Progress Notes (Signed)
Cardiology Office Note  Date: 04/03/2023   ID: Valina, Maes Jan 15, 1946, MRN [id number]  PCP:  Rebecka Apley, NP  Cardiologist:  Marjo Bicker, MD Electrophysiologist:  None   History of Present Illness: Kristy Harris is a 78 y.o. female  Minimally active at home.  She walks around the house.  She cannot walk and perform household chores due to weak strength in her legs.  No symptoms, no angina, DOE, orthopnea, PND, dizziness, syncope or leg swelling.  Has sporadic palpitations.  She does endorse some shortness of breath when she gets up and walks around the house but this could be from deconditioning.  Echocardiogram from August 2024 showed normal LVEF, G2 DD, CVP 3 mmHg, moderate aortic valve stenosis.  Past Medical History:  Diagnosis Date   Anemia    takes iron supplement   Arthritis    knees   Breast cancer (HCC) 2016   COPD (chronic obstructive pulmonary disease) (HCC)    Enlarged heart    GERD (gastroesophageal reflux disease)    Hidradenitis suppurativa    buttocks   History of breast cancer 12/2014   Hyperlipidemia    Hypertension    states under control with meds., has been on med. > 40 yrs.   Morbid obesity (HCC)    Non-insulin dependent type 2 diabetes mellitus (HCC)    Personal history of chemotherapy    Personal history of radiation therapy    Shortness of breath dyspnea    with exertion   Urinary frequency     Past Surgical History:  Procedure Laterality Date   ABDOMINAL HYSTERECTOMY  25 yrs ago   partial   BLADDER SUSPENSION     x 2   BREAST LUMPECTOMY Left 12/25/2014   BREAST LUMPECTOMY WITH RADIOACTIVE SEED AND SENTINEL LYMPH NODE BIOPSY Left 12/25/2014   Procedure: RADIOACTIVE SEED GIUDED LEFT BREAST LUMPECTOMY, LEFT AXILLARY SENTINEL LYMPH NODE BIOPSY;  Surgeon: [name], MD;  Location: [facility] OR;  Service: General;  Laterality: Left;   CARDIAC CATHETERIZATION  02/21/2011   Normal coronary arteries, normal EF   COLONOSCOPY  WITH PROPOFOL  11/18/2013   ESOPHAGOGASTRODUODENOSCOPY (EGD) WITH PROPOFOL  11/18/2013   EXCISION HYDRADENITIS LABIA N/A 12/08/2013   Procedure: EXCISION HIDRADENITIS PUBIC AREA;  Surgeon: [name], MD;  Location: [facility];  Service: General;  Laterality: N/A;   HYDRADENITIS EXCISION Left 12/08/2013   Procedure: EXCISION HIDRADENITIS AXILLA;  Surgeon: [name], MD;  Location: [facility];  Service: General;  Laterality: Left;   INCISION AND DRAINAGE ABSCESS  08/17/2008   perineum and buttock   IRRIGATION AND DEBRIDEMENT ABSCESS Right 12/23/2012   Procedure: incision  AND DEBRIDEMENT right buttock infection ;  Surgeon: Kandis Cocking, MD;  Location: [facility];  Service: General;  Laterality: Right;   PILONIDAL CYST / SINUS EXCISION  06/04/2004   PILONIDAL CYST EXCISION     PORT-A-CATH REMOVAL Right 03/21/2016   Procedure: MINOR REMOVAL PORT-A-CATH;  Surgeon: [name], MD;  Location: [facility];  Service: General;  Laterality: Right;  MINOR REMOVAL PORT-A-CATH   PORTACATH PLACEMENT N/A 01/12/2015   Procedure: INSERTION PORT-A-CATH;  Surgeon: [name], MD;  Location: [facility];  Service: General;  Laterality: N/A;   TONSILLECTOMY      Current Outpatient Medications  Medication Sig Dispense Refill   acetaminophen (TYLENOL) 500 MG tablet Take 1,000 mg by mouth every 6 (six) hours as needed for mild pain or headache.     albuterol (PROVENTIL HFA;VENTOLIN  HFA) 108 (90 BASE) MCG/ACT inhaler Inhale 2 puffs into the lungs every 4 (four) hours as needed for shortness of breath.     anastrozole (ARIMIDEX) 1 MG tablet TAKE 1 TABLET BY MOUTH EVERY DAY 90 tablet 3   atorvastatin (LIPITOR) 20 MG tablet Take 20 mg by mouth every morning.     Blood Glucose Monitoring Suppl (BLOOD GLUCOSE MONITOR SYSTEM) w/Device KIT With lancets and strips #100 6rf Dx: E11.9 Test bid     Calcium 150 MG TABS Take 1 tablet by mouth daily.      cholecalciferol (VITAMIN D) 1000 units tablet Take 2,000 Units by  mouth daily.     ferrous sulfate 325 (65 FE) MG tablet Take 325 mg by mouth 3 (three) times daily with meals.     fluconazole (DIFLUCAN) 150 MG tablet Take 150 mg by mouth as needed.      furosemide (LASIX) 20 MG tablet Take 20 mg by mouth once a week.     glipiZIDE (GLUCOTROL XL) 10 MG 24 hr tablet Take 10 mg by mouth daily with breakfast.      HYDROcodone-acetaminophen (NORCO/VICODIN) 5-325 MG tablet Take 1 tablet by mouth 2 (two) times daily as needed.     losartan (COZAAR) 100 MG tablet Take 100 mg by mouth daily.     metFORMIN (GLUCOPHAGE-XR) 500 MG 24 hr tablet TAKE ONE TABLET BY MOUTH TWICE DAILY     metoprolol succinate (TOPROL-XL) 100 MG 24 hr tablet TAKE ONE (1) TABLET EACH DAY     Misc. Devices MISC Underpad's 6 Bags/15-90 per month  Incontinence Pads 3 Bags/20-60 per month  2 Box of Gloves per month  40 box of gauze  20 rolls of tape  Nutrition supplement/30 day supply     mupirocin ointment (BACTROBAN) 2 % SMARTSIG:1 Application Topical 2-3 Times Daily     pantoprazole (PROTONIX) 40 MG tablet TAKE 1 TABLET (40 MG TOTAL) BY MOUTH DAILY. (Patient taking differently: Take 40 mg by mouth as needed.) 90 tablet 0   polyethylene glycol (MIRALAX / GLYCOLAX) packet Take 17 g by mouth daily as needed for mild constipation. 14 each 0   Secukinumab, 300 MG Dose, (COSENTYX, 300 MG DOSE,) 150 MG/ML SOSY Inject 300 mg into the skin every 30 (thirty) days.     sodium chloride irrigation 0.9 % irrigation USE AS DIRECTED     sulfamethoxazole-trimethoprim (BACTRIM DS) 800-160 MG tablet Take 1 tablet by mouth 2 (two) times daily.     verapamil (CALAN-SR) 120 MG CR tablet Take 120 mg by mouth daily.     No current facility-administered medications for this visit.   Allergies:  Lisinopril and Penicillins   Social History: The patient  reports that she quit smoking about 29 years ago. Her smoking use included cigarettes. She started smoking about 29 years ago. She has never used smokeless  tobacco. She reports that she does not drink alcohol and does not use drugs.   Family History: The patient's family history includes Breast cancer in her cousin, maternal aunt, maternal grandmother, and sister; Breast cancer (age of onset: 50) in her sister; Dementia in her maternal aunt; Diabetes in her sister; Heart attack in her mother; Hypertension in her brother, daughter, and son; Lung cancer in her brother; Lung cancer (age of onset: 89) in her father; Prostate cancer in her brother; Throat cancer in her brother.   ROS:  Please see the history of present illness. Otherwise, complete review of systems is positive for none.  All other systems are reviewed and negative.   Physical Exam: VS:  BP 103/63   Pulse 70   Ht 5\' 3"  (1.6 m)   Wt 198 lb 12.8 oz (90.2 kg)   SpO2 94%   BMI 35.22 kg/m , BMI Body mass index is 35.22 kg/m.  Wt Readings from Last 3 Encounters:  04/03/23 198 lb 12.8 oz (90.2 kg)  02/05/23 204 lb 6.4 oz (92.7 kg)  10/27/22 220 lb 1.6 oz (99.8 kg)    General: Patient appears comfortable at rest. HEENT: Conjunctiva and lids normal, oropharynx clear with moist mucosa. Neck: Supple, no elevated JVP or carotid bruits, no thyromegaly. Lungs: Clear to auscultation, nonlabored breathing at rest. Cardiac: Regular rate and rhythm, no S3 or significant systolic murmur, no pericardial rub. Abdomen: Soft, nontender, no hepatomegaly, bowel sounds present, no guarding or rebound. Extremities: No pitting edema, distal pulses 2+. Skin: Warm and dry. Musculoskeletal: No kyphosis. Neuropsychiatric: Alert and oriented x3, affect grossly appropriate.  Recent Labwork: 04/03/2023: ALT 10; AST 10; BUN 13; Creatinine, Ser 0.75; Hemoglobin 9.0; Platelets 320; Potassium 4.6; Sodium 138  No results found for: "CHOL", "TRIG", "HDL", "CHOLHDL", "VLDL", "LDLCALC", "LDLDIRECT"   Assessment and Plan:  Moderate aortic valve stenosis in August 2024: Asymptomatic.  Exam showed ESM with loud S2  consistent with moderate aortic valve stenosis.  Repeat echocardiogram in August 2025.  I reviewed the symptoms of severe AS and provided ER precautions.  HTN, controlled: Continue current antihypertensives, losartan 100 mg once daily, metoprolol succinate 100 mg once daily and p.o. Lasix 20 mg weekly.  Follows with PCP.  HLD, unknown values: Continue atorvastatin 20 mg nightly.  Goal LDL less than 100.  Follows with PCP.    Medication Adjustments/Labs and Tests Ordered: Current medicines are reviewed at length with the patient today.  Concerns regarding medicines are outlined above.    Disposition:  Follow up   Signed, Kristy Boehlke Verne Spurr, MD, 04/03/2023 10:01 AM    [facility] [address], [place], [place] [address]

## 2023-05-06 ENCOUNTER — Other Ambulatory Visit: Payer: Self-pay

## 2023-05-06 DIAGNOSIS — D649 Anemia, unspecified: Secondary | ICD-10-CM

## 2023-05-06 DIAGNOSIS — D508 Other iron deficiency anemias: Secondary | ICD-10-CM

## 2023-05-06 DIAGNOSIS — C50412 Malignant neoplasm of upper-outer quadrant of left female breast: Secondary | ICD-10-CM

## 2023-05-07 ENCOUNTER — Inpatient Hospital Stay: Payer: 59 | Attending: Hematology

## 2023-05-07 DIAGNOSIS — C50912 Malignant neoplasm of unspecified site of left female breast: Secondary | ICD-10-CM | POA: Diagnosis present

## 2023-05-07 DIAGNOSIS — Z79899 Other long term (current) drug therapy: Secondary | ICD-10-CM | POA: Diagnosis not present

## 2023-05-07 DIAGNOSIS — Z8042 Family history of malignant neoplasm of prostate: Secondary | ICD-10-CM | POA: Diagnosis not present

## 2023-05-07 DIAGNOSIS — Z170 Estrogen receptor positive status [ER+]: Secondary | ICD-10-CM | POA: Diagnosis not present

## 2023-05-07 DIAGNOSIS — Z803 Family history of malignant neoplasm of breast: Secondary | ICD-10-CM | POA: Insufficient documentation

## 2023-05-07 DIAGNOSIS — Z1721 Progesterone receptor positive status: Secondary | ICD-10-CM | POA: Insufficient documentation

## 2023-05-07 DIAGNOSIS — Z1731 Human epidermal growth factor receptor 2 positive status: Secondary | ICD-10-CM | POA: Insufficient documentation

## 2023-05-07 DIAGNOSIS — Z79811 Long term (current) use of aromatase inhibitors: Secondary | ICD-10-CM | POA: Diagnosis not present

## 2023-05-07 DIAGNOSIS — D649 Anemia, unspecified: Secondary | ICD-10-CM | POA: Insufficient documentation

## 2023-05-07 DIAGNOSIS — Z801 Family history of malignant neoplasm of trachea, bronchus and lung: Secondary | ICD-10-CM | POA: Insufficient documentation

## 2023-05-07 DIAGNOSIS — Z808 Family history of malignant neoplasm of other organs or systems: Secondary | ICD-10-CM | POA: Insufficient documentation

## 2023-05-07 DIAGNOSIS — D508 Other iron deficiency anemias: Secondary | ICD-10-CM

## 2023-05-07 LAB — CBC WITH DIFFERENTIAL/PLATELET
Abs Immature Granulocytes: 0.02 10*3/uL (ref 0.00–0.07)
Basophils Absolute: 0 10*3/uL (ref 0.0–0.1)
Basophils Relative: 0 %
Eosinophils Absolute: 0.3 10*3/uL (ref 0.0–0.5)
Eosinophils Relative: 3 %
HCT: 29.7 % — ABNORMAL LOW (ref 36.0–46.0)
Hemoglobin: 8.8 g/dL — ABNORMAL LOW (ref 12.0–15.0)
Immature Granulocytes: 0 %
Lymphocytes Relative: 21 %
Lymphs Abs: 1.7 10*3/uL (ref 0.7–4.0)
MCH: 25 pg — ABNORMAL LOW (ref 26.0–34.0)
MCHC: 29.6 g/dL — ABNORMAL LOW (ref 30.0–36.0)
MCV: 84.4 fL (ref 80.0–100.0)
Monocytes Absolute: 0.6 10*3/uL (ref 0.1–1.0)
Monocytes Relative: 7 %
Neutro Abs: 5.7 10*3/uL (ref 1.7–7.7)
Neutrophils Relative %: 69 %
Platelets: 335 10*3/uL (ref 150–400)
RBC: 3.52 MIL/uL — ABNORMAL LOW (ref 3.87–5.11)
RDW: 15.5 % (ref 11.5–15.5)
WBC: 8.2 10*3/uL (ref 4.0–10.5)
nRBC: 0 % (ref 0.0–0.2)

## 2023-05-07 LAB — COMPREHENSIVE METABOLIC PANEL
ALT: 10 U/L (ref 0–44)
AST: 11 U/L — ABNORMAL LOW (ref 15–41)
Albumin: 3 g/dL — ABNORMAL LOW (ref 3.5–5.0)
Alkaline Phosphatase: 78 U/L (ref 38–126)
Anion gap: 10 (ref 5–15)
BUN: 15 mg/dL (ref 8–23)
CO2: 26 mmol/L (ref 22–32)
Calcium: 9 mg/dL (ref 8.9–10.3)
Chloride: 102 mmol/L (ref 98–111)
Creatinine, Ser: 0.75 mg/dL (ref 0.44–1.00)
GFR, Estimated: >60 mL/min (ref >60)
Glucose, Bld: 111 mg/dL — ABNORMAL HIGH (ref 70–99)
Potassium: 4.5 mmol/L (ref 3.5–5.1)
Sodium: 138 mmol/L (ref 135–145)
Total Bilirubin: 0.5 mg/dL (ref 0.0–1.2)
Total Protein: 7.4 g/dL (ref 6.5–8.1)

## 2023-05-07 LAB — FERRITIN: Ferritin: 505 ng/mL — ABNORMAL HIGH (ref 11–307)

## 2023-05-07 LAB — IRON AND TIBC
Iron: 21 ug/dL — ABNORMAL LOW (ref 28–170)
Saturation Ratios: 11 % (ref 10.4–31.8)
TIBC: 191 ug/dL — ABNORMAL LOW (ref 250–450)
UIBC: 170 ug/dL

## 2023-05-14 ENCOUNTER — Inpatient Hospital Stay: Payer: 59 | Admitting: Oncology

## 2023-05-14 VITALS — BP 119/72 | HR 64 | Temp 98.0°F | Resp 16 | Wt 196.4 lb

## 2023-05-14 DIAGNOSIS — D508 Other iron deficiency anemias: Secondary | ICD-10-CM | POA: Diagnosis not present

## 2023-05-14 DIAGNOSIS — D649 Anemia, unspecified: Secondary | ICD-10-CM | POA: Diagnosis not present

## 2023-05-14 DIAGNOSIS — C50912 Malignant neoplasm of unspecified site of left female breast: Secondary | ICD-10-CM | POA: Diagnosis not present

## 2023-05-14 NOTE — Progress Notes (Addendum)
 [facility] [address], [place] [address]  Clinic Day:  05/14/2023  Referring physician: Rebecka Apley, NP  Patient Care Team: Rebecka Apley, NP as PCP - General (Adult Health Nurse Practitioner) Kristy Bicker, MD as PCP - Cardiology (Cardiology) Kristy Roberts, MD as Consulting Physician (Dermatology) Kristy Rotunda, MD as Consulting Physician (Cardiology) Kristy Morale, MD as Consulting Physician (Cardiology) Kristy Puffer, MD as Consulting Physician (Radiation Oncology) Kristy Kin, MD as Consulting Physician (General Surgery) [facility], Novella Olive, MD (Inactive) as Consulting Physician (Hematology and Oncology)   ASSESSMENT & PLAN:   Assessment: 1. 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  She then underwent XRT by Dr. Mitzi Hansen (05/08/2015- 06/22/2015) with continuation of Herceptin x 52 weeks finishing on 02/07/2016. -Anastrozole started in May 2017, tolerating it very well.  Hot flashes minor or stable.   2.  Osteopenia: - DEXA scan on 06/12/2017 shows T score of -2.3 in the femoral neck.  Prior to that DEXA scan 2017 showed T score of -0.1. -Prolia started on September 18, 2017. -DEXA scan on December 20, 2019 with T score of -0.9.   3.  Normocytic anemia: -She is taking iron supplements.  Plan: 1. Stage IIA (T1cN1) invasive ductal carcinoma of left breast, ER+/PR+/HER2+, with 1/1 sentinel lymph node for metastatic disease:  - She is tolerating anastrozole except for occasional hot flashes. - She is continuing anastrozole beyond 5 years. - Mammogram on 12/29/2022 was BI-RADS Category 1.  Next due in October 2025.   2.  Osteopenia: - Last Prolia shot on 02/20/2023. -Repeat Prolia in June 2025. -Calcium level is WNL. -Continue calcium and vitamin D supplements.  3.   Normocytic anemia: - Anemia from chronic inflammation from hidradenitis flareups and mild CKD. - Denies any bleeding per rectum or melena.  Has hidradenitis flareups. -She received 1 unit of blood a few months ago (August 2024).  - She is taking 2 iron tablets daily. 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  - Labs from 05/07/2023 show fairly unremarkable CMP with only mildly elevated glucose levels and low albumin levels.  CBC shows hemoglobin of 8.8 with hematocrit of 29.7.  White blood cell count is normal.  Differential is normal. -She has had anemia ranging from as low as 7.2-normal with baseline between 8 and 9 since 2010. -EPO level was elevated at 28 likely indicating CKD.  Most recent creatinine was 0.96 with BUN of 16. -Discussed that her last visit possible bone marrow biopsy but she declined.  Reports she has had a low hemoglobin most of her life and would like to continue oral iron tablets.  Given hemoglobin has improved, would recommend continuing iron at this time.  Will hold off on EPO stimulating agent such as Retacrit or Procrit.  PLAN SUMMARY: >> Prolia in June 2025.  >> Continue to iron tablets daily. >> Return to clinic in 3 months for Prolia and lab work possible Retacrit.    I spent 25 minutes dedicated to the care of this patient (face-to-face and non-face-to-face) on the date of the encounter to include what is described in the assessment and plan.   No orders of the defined types were placed in this encounter.  Mauro Kaufmann, NP   3/13/202512:43 PM  CHIEF COMPLAINT:   Diagnosis: left breast cancer    Cancer Staging  Breast cancer of upper-outer quadrant of left female breast [facility]) Staging form: Breast, AJCC 7th Edition - Clinical stage from 02/08/2015: Stage IIA (T1c, N1, M0) - Signed by Ellouise Newer, PA-C on 02/08/2015    Prior Therapy: 1. Left lumpectomy 12/25/14 2. Adjuvant chemotherapy with Abraxane/Herceptin (02/01/2015 - 04/19/2015)  3. XRT by Dr. Mitzi Hansen (05/08/2015- 06/22/2015)  4. Herceptin, completed 02/07/16  Current Therapy:  anastrozole    HISTORY OF PRESENT ILLNESS:   Oncology History  Breast cancer of upper-outer quadrant of left female breast (HCC)  11/01/2014 Mammogram   Possible mass in the left breast upper outer quadrant measuring 13 mm suspicious for breast cancer confirmed through ultrasound a spiculated hypoechoic mass ill-defined, no enlarged lymph nodes   11/01/2014 Initial Diagnosis   Invasive ductal carcinoma, moderately differentiated, 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 The estimated ejection   fraction was in the range of 55% to 60%.    12/25/2014 Surgery   Left lumpectomy: IDC 1.7 cm, positive for LVI, with DCIS, 1/1 sentinel node positive deposit 1.9 cm with extracapsular extension, ER 90%, PR 90%, HER-2 positive ratio 2.4, Ki-67 29% T1 cN1 stage II a   02/01/2015 - 04/19/2015 Chemotherapy   Abraxane/Herceptin   03/20/2015 Echocardiogram   Systolic function was normal. The estimated ejection fraction was in the range of 60%  to 65%.   05/08/2015 - 06/22/2015 Radiation Therapy   Dr. Mitzi Hansen    05/10/2015 - 02/07/2016 Antibody Plan   Herceptin every 21 days to complete 52 weeks worth of therapy.   06/13/2015 Echocardiogram   2D echo- The estimated ejection fraction was in the range of 60% to 65%. Diastolic function is abnormal, indeterminate grade. Wall motion was normal.   06/28/2015 Imaging   Bone density- BMD as determined from Femur Total Left is 0.994 g/cm2 with a T-Score of -0.1. This patient is considered normal according to World Health Organization [facility] criteria.    07/13/2015 -  Anti-estrogen oral therapy   Arimidex daily    09/12/2015 Echocardiogram   The estimated ejection fraction was in the range of 60% to 65%. Wall motion was normal; there were no regional wall motion abnormalities. Doppler parameters are consistent with abnormal L ventricular relaxation (grade 1 diastolic dysfunction).   12/04/2015 Echocardiogram   Left ventricle: The cavity size was normal. Wall thickness was   increased increased in a pattern of mild to moderate LVH.   Systolic function was normal. The estimated ejection fraction was   in the range of 60% to 65%. Wall motion was normal; there were no   regional wall motion abnormalities. Features are consistent with   a pseudonormal left ventricular filling pattern, with concomitant   abnormal relaxation and increased filling pressure (grade 2   diastolic dysfunction). Doppler parameters are consistent with   high ventricular filling pressure.   03/21/2016 Procedure   Port removed by Dr. Ezzard Standing      INTERVAL HISTORY:   Zada is a 78 y.o. female presenting to clinic today for follow up of left breast cancer. She was last seen in clinic on 02/05/23.  Since her last visit, she denies any hospitalizations, surgeries or changes to her baseline health.  She denies any bleeding, melena or  hematochezia.  Appetite has been 100% energy levels are 75%.  She continues to have shortness of breath with exertion.  She continues to have knees and back pain and uses a lift chair at home.  Uses a rolling walker and cane as needed for ambulation.  Will likely need surgery but is not interested at this time.  Appetite has improved.   She was seen by cardiology on 04/03/2023 for follow-up for aortic valve stenosis, hypertension and elevated cholesterol levels.  No medication changes were made.  She appears to be stable from cardiology standpoint.  PAST MEDICAL HISTORY:   Past Medical History: Past Medical History:  Diagnosis Date   Anemia    takes iron supplement   Arthritis    knees   Breast cancer  (HCC) 2016   COPD (chronic obstructive pulmonary disease) (HCC)    Enlarged heart    GERD (gastroesophageal reflux disease)    Hidradenitis suppurativa    buttocks   History of breast cancer 12/2014   Hyperlipidemia    Hypertension    states under control with meds., has been on med. > 40 yrs.   Morbid obesity (HCC)    Non-insulin dependent type 2 diabetes mellitus (HCC)    Personal history of chemotherapy    Personal history of radiation therapy    Shortness of breath dyspnea    with exertion   Urinary frequency     Surgical History: Past Surgical History:  Procedure Laterality Date   ABDOMINAL HYSTERECTOMY  25 yrs ago   partial   BLADDER SUSPENSION     x 2   BREAST LUMPECTOMY Left 12/25/2014   BREAST LUMPECTOMY WITH RADIOACTIVE SEED AND SENTINEL LYMPH NODE BIOPSY Left 12/25/2014   Procedure: RADIOACTIVE SEED GIUDED LEFT BREAST LUMPECTOMY, LEFT AXILLARY SENTINEL LYMPH NODE BIOPSY;  Surgeon: Kristy Kin, MD;  Location: [facility];  Service: General;  Laterality: Left;   CARDIAC CATHETERIZATION  02/21/2011   Normal coronary arteries, normal EF   COLONOSCOPY WITH PROPOFOL  11/18/2013   ESOPHAGOGASTRODUODENOSCOPY (EGD) WITH PROPOFOL  11/18/2013   EXCISION HYDRADENITIS LABIA N/A 12/08/2013   Procedure: EXCISION HIDRADENITIS PUBIC AREA;  Surgeon: Kristy Kin, MD;  Location: [facility];  Service: General;  Laterality: N/A;   HYDRADENITIS EXCISION Left 12/08/2013   Procedure: EXCISION HIDRADENITIS AXILLA;  Surgeon: Kristy Kin, MD;  Location: [facility];  Service: General;  Laterality: Left;   INCISION AND DRAINAGE ABSCESS  08/17/2008   perineum and buttock   IRRIGATION AND DEBRIDEMENT ABSCESS Right 12/23/2012   Procedure: incision  AND DEBRIDEMENT right buttock infection ;  Surgeon: Kandis Cocking, MD;  Location: [facility];  Service: General;  Laterality: Right;   PILONIDAL CYST / SINUS EXCISION  06/04/2004   PILONIDAL CYST EXCISION     PORT-A-CATH REMOVAL Right 03/21/2016   Procedure: MINOR  REMOVAL PORT-A-CATH;  Surgeon: Kristy Kin, MD;  Location: [facility];  Service: General;  Laterality: Right;  MINOR REMOVAL PORT-A-CATH   PORTACATH PLACEMENT N/A 01/12/2015   Procedure: INSERTION PORT-A-CATH;  Surgeon: Kristy Kin, MD;  Location: [facility];  Service: General;  Laterality: N/A;   TONSILLECTOMY      Social History: Social History   Socioeconomic History   Marital status: Widowed    Spouse name: Not on file   Number of children: 5   Years of education: Not on file   Highest education level: Not on file  Occupational History   Occupation: Textile work    Comment: Disabled  Tobacco Use  Smoking status: Former    Current packs/day: 0.00    Types: Cigarettes    Start date: 03/03/1994    Quit date: 03/03/1994    Years since quitting: 29.2   Smokeless tobacco: Never  Vaping Use   Vaping status: Never Used  Substance and Sexual Activity   Alcohol use: No   Drug use: No   Sexual activity: Not Currently    Partners: Male  Other Topics Concern   Not on file  Social History Narrative   Lives with grandson.  Widowed.  Has 2 sons and 3 daughters   Social Drivers of Corporate investment banker Strain: High Risk (02/04/2023)   Received from [facility]   Overall Financial Resource Strain (CARDIA)    Difficulty of Paying Living Expenses: Very hard  Food Insecurity: No Food Insecurity (02/04/2023)   Received from [facility]   Hunger Vital Sign    Worried About Running Out of Food in the Last Year: Never true    Ran Out of Food in the Last Year: Never true  Transportation Needs: No Transportation Needs (02/04/2023)   Received from [facility] - Transportation    Lack of Transportation (Medical): No    Lack of Transportation (Non-Medical): No  Physical Activity: Unknown (02/04/2023)   Received from [facility]   Exercise Vital Sign    Days of Exercise per Week: 0 days    Minutes of Exercise per Session: Not on file  Stress: No Stress  Concern Present (02/04/2023)   Received from [facility] - Occupational Stress Questionnaire    Feeling of Stress : Not at all  Social Connections: Socially Integrated (02/04/2023)   [facility] from [facility]   Social Network    How would you rate your social network (family, work, friends)?: Good participation with social networks  Intimate Partner Violence: Not At Risk (02/04/2023)   Received from [facility]   HITS    Over the last 12 months how often did your partner physically hurt you?: Never    Over the last 12 months how often did your partner insult you or talk down to you?: Never    Over the last 12 months how often did your partner threaten you with physical harm?: Never    Over the last 12 months how often did your partner scream or curse at you?: Never    Family History: Family History  Problem Relation Age of Onset   Heart attack Mother        had multiple health problems   Lung cancer Father 31       smoker   Prostate cancer Brother    Lung cancer Brother        dx. 66s; smoker   Throat cancer Brother        dx. 30s; smoker   Diabetes Sister        has 3 living sisters with multiple health problems   Hypertension Son    Hypertension Daughter    Breast cancer Sister 69   Breast cancer Sister        dx. 40s   Breast cancer Maternal Grandmother        dx. 70s   Breast cancer Maternal Aunt        dx. older than 50   Dementia Maternal Aunt    Breast cancer Cousin        dx. 50s   Hypertension Brother  Current Medications:  Current Outpatient Medications:    acetaminophen (TYLENOL) 500 MG tablet, Take 1,000 mg by mouth every 6 (six) hours as needed for mild pain or headache., Disp: , Rfl:    albuterol (PROVENTIL HFA;VENTOLIN HFA) 108 (90 BASE) MCG/ACT inhaler, Inhale 2 puffs into the lungs every 4 (four) hours as needed for shortness of breath., Disp: , Rfl:    anastrozole (ARIMIDEX) 1 MG tablet, TAKE 1  TABLET BY MOUTH EVERY DAY, Disp: 90 tablet, Rfl: 3   atorvastatin (LIPITOR) 20 MG tablet, Take 20 mg by mouth every morning., Disp: , Rfl:    Blood Glucose Monitoring Suppl (BLOOD GLUCOSE MONITOR SYSTEM) w/Device KIT, With lancets and strips #[id number] Dx: E11.9 Test bid, Disp: , Rfl:    Calcium 150 MG TABS, Take 1 tablet by mouth daily. , Disp: , Rfl:    cholecalciferol (VITAMIN D) 1000 units tablet, Take 2,000 Units by mouth daily., Disp: , Rfl:    ferrous sulfate 325 (65 FE) MG tablet, Take 325 mg by mouth 3 (three) times daily with meals., Disp: , Rfl:    fluconazole (DIFLUCAN) 150 MG tablet, Take 150 mg by mouth as needed. , Disp: , Rfl:    furosemide (LASIX) 20 MG tablet, Take 20 mg by mouth once a week., Disp: , Rfl:    glipiZIDE (GLUCOTROL XL) 10 MG 24 hr tablet, Take 10 mg by mouth daily with breakfast. , Disp: , Rfl:    HYDROcodone-acetaminophen (NORCO/VICODIN) 5-325 MG tablet, Take 1 tablet by mouth 2 (two) times daily as needed., Disp: , Rfl:    losartan (COZAAR) 100 MG tablet, Take 100 mg by mouth daily., Disp: , Rfl:    metFORMIN (GLUCOPHAGE-XR) 500 MG 24 hr tablet, TAKE ONE TABLET BY MOUTH TWICE DAILY, Disp: , Rfl:    metoprolol succinate (TOPROL-XL) 100 MG 24 hr tablet, TAKE ONE (1) TABLET EACH DAY, Disp: , Rfl:    Misc. Devices MISC, Underpad's 6 Bags/15-90 per month  Incontinence Pads 3 Bags/20-60 per month  2 Box of Gloves per month  40 box of gauze  20 rolls of tape  Nutrition supplement/30 day supply, Disp: , Rfl:    mupirocin ointment (BACTROBAN) 2 %, SMARTSIG:1 Application Topical 2-3 Times Daily, Disp: , Rfl:    pantoprazole (PROTONIX) 40 MG tablet, TAKE 1 TABLET (40 MG TOTAL) BY MOUTH DAILY. (Patient taking differently: Take 40 mg by mouth as needed.), Disp: 90 tablet, Rfl: 0   polyethylene glycol (MIRALAX / GLYCOLAX) packet, Take 17 g by mouth daily as needed for mild constipation., Disp: 14 each, Rfl: 0   Secukinumab, 300 MG Dose, (COSENTYX, 300 MG DOSE,) 150 MG/ML SOSY,  Inject 300 mg into the skin every 30 (thirty) days., Disp: , Rfl:    sodium chloride irrigation 0.9 % irrigation, USE AS DIRECTED, Disp: , Rfl:    sulfamethoxazole-trimethoprim (BACTRIM DS) 800-160 MG tablet, Take 1 tablet by mouth 2 (two) times daily., Disp: , Rfl:    verapamil (CALAN-SR) 120 MG CR tablet, Take 120 mg by mouth daily., Disp: , Rfl:    Allergies: Allergies  Allergen Reactions   Lisinopril Swelling and Cough    Mouth swelling   Penicillins Nausea And Vomiting and Rash    .Did it involve swelling of the face/tongue/throat, SOB, or low BP? Yes Did it involve sudden or severe rash/hives, skin peeling, or any reaction on the inside of your mouth or nose? Yes Did you need to seek medical attention at a [facility] or doctor's office?  Yes When did it last happen?       If all above answers are "NO", may proceed with cephalosporin use.     REVIEW OF SYSTEMS:   Review of Systems  Constitutional:  Positive for fatigue.  Respiratory:  Positive for shortness of breath.   Cardiovascular:  Positive for palpitations.  Musculoskeletal:  Positive for arthralgias.  Neurological:  Positive for dizziness.  Psychiatric/Behavioral:  Positive for sleep disturbance.      VITALS:   There were no vitals taken for this visit.  Wt Readings from Last 3 Encounters:  04/03/23 198 lb 12.8 oz (90.2 kg)  02/05/23 204 lb 6.4 oz (92.7 kg)  10/27/22 220 lb 1.6 oz (99.8 kg)    There is no height or weight on file to calculate BMI.  Performance status (ECOG): 1 - Symptomatic but completely ambulatory  PHYSICAL EXAM:   Physical Exam Constitutional:      Appearance: Normal appearance.  Cardiovascular:     Rate and Rhythm: Normal rate and regular rhythm.  Pulmonary:     Effort: Pulmonary effort is normal.     Breath sounds: Normal breath sounds.  Abdominal:     General: Bowel sounds are normal.     Palpations: Abdomen is soft.  Musculoskeletal:        General: No swelling. Normal range of  motion.  Neurological:     Mental Status: She is alert and oriented to person, place, and time. Mental status is at baseline.     LABS:      Latest Ref Rng & Units 05/07/2023    1:05 PM 04/03/2023    9:16 AM 02/20/2023    9:31 AM  CBC  WBC 4.0 - 10.5 K/uL 8.2  6.8  7.8   Hemoglobin 12.0 - 15.0 g/dL 8.8  9.0  8.6   Hematocrit 36.0 - 46.0 % 29.7  30.6  28.9   Platelets 150 - 400 K/uL 335  320  356       Latest Ref Rng & Units 05/07/2023    1:05 PM 04/03/2023    9:16 AM 02/20/2023    9:31 AM  CMP  Glucose 70 - 99 mg/dL 308  657  846   BUN 8 - 23 mg/dL 15  13  11    Creatinine 0.44 - 1.00 mg/dL 9.62  9.52  8.41   Sodium 135 - 145 mmol/L 138  138  135   Potassium 3.5 - 5.1 mmol/L 4.5  4.6  4.0   Chloride 98 - 111 mmol/L 102  104  101   CO2 22 - 32 mmol/L 26  24  25    Calcium 8.9 - 10.3 mg/dL 9.0  8.8  9.4   Total Protein 6.5 - 8.1 g/dL 7.4  7.8  7.5   Total Bilirubin 0.0 - 1.2 mg/dL 0.5  0.4  0.5   Alkaline Phos 38 - 126 U/L 78  77  79   AST 15 - 41 U/L 11  10  10    ALT 0 - 44 U/L 10  10  9       No results found for: "CEA1", "CEA" / No results found for: "CEA1", "CEA" No results found for: "PSA1" No results found for: "LKG401" No results found for: "CAN125"  Lab Results  Component Value Date   TOTALPROTELP 6.9 07/14/2022   TOTALPROTELP 6.9 07/14/2022   ALBUMINELP 2.6 (L) 07/14/2022   A1GS 0.5 (H) 07/14/2022   A2GS 1.0 07/14/2022   BETS 1.0 07/14/2022  GAMS 1.8 07/14/2022   MSPIKE Not Observed 07/14/2022   SPEI Comment 07/14/2022   Lab Results  Component Value Date   TIBC 191 (L) 05/07/2023   TIBC 207 (L) 04/03/2023   TIBC 183 (L) 01/20/2023   FERRITIN 505 (H) 05/07/2023   FERRITIN 803 (H) 01/20/2023   FERRITIN 976 (H) 10/20/2022   IRONPCTSAT 11 05/07/2023   IRONPCTSAT 12 04/03/2023   IRONPCTSAT 15 01/20/2023   Lab Results  Component Value Date   LDH 83 (L) 01/20/2023   LDH 114 12/20/2019   LDH 122 07/19/2019     STUDIES:   No results found.

## 2023-05-18 ENCOUNTER — Encounter (HOSPITAL_COMMUNITY): Payer: Self-pay | Admitting: Hematology

## 2023-05-21 ENCOUNTER — Other Ambulatory Visit: Payer: Self-pay

## 2023-05-21 ENCOUNTER — Encounter (HOSPITAL_COMMUNITY): Payer: Self-pay | Admitting: Internal Medicine

## 2023-05-21 ENCOUNTER — Inpatient Hospital Stay (HOSPITAL_COMMUNITY)
Admission: EM | Admit: 2023-05-21 | Discharge: 2023-05-26 | DRG: 378 | Disposition: A | Discharge status: Home / Self Care | Attending: Internal Medicine | Admitting: Internal Medicine

## 2023-05-21 DIAGNOSIS — D62 Acute posthemorrhagic anemia: Secondary | ICD-10-CM | POA: Diagnosis present

## 2023-05-21 DIAGNOSIS — L732 Hidradenitis suppurativa: Secondary | ICD-10-CM | POA: Diagnosis present

## 2023-05-21 DIAGNOSIS — K5731 Diverticulosis of large intestine without perforation or abscess with bleeding: Principal | ICD-10-CM | POA: Diagnosis present

## 2023-05-21 DIAGNOSIS — Z9221 Personal history of antineoplastic chemotherapy: Secondary | ICD-10-CM

## 2023-05-21 DIAGNOSIS — Z7984 Long term (current) use of oral hypoglycemic drugs: Secondary | ICD-10-CM

## 2023-05-21 DIAGNOSIS — K5900 Constipation, unspecified: Secondary | ICD-10-CM | POA: Diagnosis not present

## 2023-05-21 DIAGNOSIS — K219 Gastro-esophageal reflux disease without esophagitis: Secondary | ICD-10-CM | POA: Diagnosis present

## 2023-05-21 DIAGNOSIS — Z87891 Personal history of nicotine dependence: Secondary | ICD-10-CM

## 2023-05-21 DIAGNOSIS — E669 Obesity, unspecified: Secondary | ICD-10-CM | POA: Diagnosis present

## 2023-05-21 DIAGNOSIS — Z6834 Body mass index (BMI) 34.0-34.9, adult: Secondary | ICD-10-CM

## 2023-05-21 DIAGNOSIS — Z803 Family history of malignant neoplasm of breast: Secondary | ICD-10-CM

## 2023-05-21 DIAGNOSIS — N182 Chronic kidney disease, stage 2 (mild): Secondary | ICD-10-CM | POA: Diagnosis present

## 2023-05-21 DIAGNOSIS — Z8249 Family history of ischemic heart disease and other diseases of the circulatory system: Secondary | ICD-10-CM

## 2023-05-21 DIAGNOSIS — Z79899 Other long term (current) drug therapy: Secondary | ICD-10-CM

## 2023-05-21 DIAGNOSIS — J449 Chronic obstructive pulmonary disease, unspecified: Secondary | ICD-10-CM | POA: Diagnosis present

## 2023-05-21 DIAGNOSIS — K921 Melena: Secondary | ICD-10-CM | POA: Diagnosis not present

## 2023-05-21 DIAGNOSIS — Z853 Personal history of malignant neoplasm of breast: Secondary | ICD-10-CM

## 2023-05-21 DIAGNOSIS — E1122 Type 2 diabetes mellitus with diabetic chronic kidney disease: Secondary | ICD-10-CM | POA: Diagnosis present

## 2023-05-21 DIAGNOSIS — I10 Essential (primary) hypertension: Secondary | ICD-10-CM | POA: Diagnosis present

## 2023-05-21 DIAGNOSIS — Z923 Personal history of irradiation: Secondary | ICD-10-CM

## 2023-05-21 DIAGNOSIS — E785 Hyperlipidemia, unspecified: Secondary | ICD-10-CM | POA: Diagnosis present

## 2023-05-21 DIAGNOSIS — E119 Type 2 diabetes mellitus without complications: Secondary | ICD-10-CM

## 2023-05-21 DIAGNOSIS — Z79811 Long term (current) use of aromatase inhibitors: Secondary | ICD-10-CM

## 2023-05-21 DIAGNOSIS — Z1152 Encounter for screening for COVID-19: Secondary | ICD-10-CM

## 2023-05-21 DIAGNOSIS — I129 Hypertensive chronic kidney disease with stage 1 through stage 4 chronic kidney disease, or unspecified chronic kidney disease: Secondary | ICD-10-CM | POA: Diagnosis present

## 2023-05-21 DIAGNOSIS — K648 Other hemorrhoids: Secondary | ICD-10-CM | POA: Diagnosis present

## 2023-05-21 DIAGNOSIS — D649 Anemia, unspecified: Secondary | ICD-10-CM

## 2023-05-21 DIAGNOSIS — K922 Gastrointestinal hemorrhage, unspecified: Principal | ICD-10-CM

## 2023-05-21 DIAGNOSIS — Z888 Allergy status to other drugs, medicaments and biological substances status: Secondary | ICD-10-CM

## 2023-05-21 DIAGNOSIS — Z880 Allergy status to penicillin: Secondary | ICD-10-CM

## 2023-05-21 DIAGNOSIS — D509 Iron deficiency anemia, unspecified: Secondary | ICD-10-CM | POA: Diagnosis present

## 2023-05-21 DIAGNOSIS — Z833 Family history of diabetes mellitus: Secondary | ICD-10-CM

## 2023-05-21 LAB — COMPREHENSIVE METABOLIC PANEL
ALT: 9 U/L (ref 0–44)
AST: 13 U/L — ABNORMAL LOW (ref 15–41)
Albumin: 3.2 g/dL — ABNORMAL LOW (ref 3.5–5.0)
Alkaline Phosphatase: 77 U/L (ref 38–126)
Anion gap: 12 (ref 5–15)
BUN: 20 mg/dL (ref 8–23)
CO2: 21 mmol/L — ABNORMAL LOW (ref 22–32)
Calcium: 8.9 mg/dL (ref 8.9–10.3)
Chloride: 102 mmol/L (ref 98–111)
Creatinine, Ser: 0.82 mg/dL (ref 0.44–1.00)
GFR, Estimated: >60 mL/min (ref >60)
Glucose, Bld: 109 mg/dL — ABNORMAL HIGH (ref 70–99)
Potassium: 5.2 mmol/L — ABNORMAL HIGH (ref 3.5–5.1)
Sodium: 135 mmol/L (ref 135–145)
Total Bilirubin: 0.9 mg/dL (ref 0.0–1.2)
Total Protein: 7.6 g/dL (ref 6.5–8.1)

## 2023-05-21 LAB — I-STAT CHEM 8, ED
BUN: 19 mg/dL (ref 8–23)
Calcium, Ion: 1.19 mmol/L (ref 1.15–1.40)
Chloride: 103 mmol/L (ref 98–111)
Creatinine, Ser: 0.9 mg/dL (ref 0.44–1.00)
Glucose, Bld: 126 mg/dL — ABNORMAL HIGH (ref 70–99)
HCT: 26 % — ABNORMAL LOW (ref 36.0–46.0)
Hemoglobin: 8.8 g/dL — ABNORMAL LOW (ref 12.0–15.0)
Potassium: 4.5 mmol/L (ref 3.5–5.1)
Sodium: 135 mmol/L (ref 135–145)
TCO2: 24 mmol/L (ref 22–32)

## 2023-05-21 LAB — CBC WITH DIFFERENTIAL/PLATELET
Abs Immature Granulocytes: 0.04 10*3/uL (ref 0.00–0.07)
Basophils Absolute: 0 10*3/uL (ref 0.0–0.1)
Basophils Relative: 0 %
Eosinophils Absolute: 0.1 10*3/uL (ref 0.0–0.5)
Eosinophils Relative: 1 %
HCT: 29.2 % — ABNORMAL LOW (ref 36.0–46.0)
Hemoglobin: 8.6 g/dL — ABNORMAL LOW (ref 12.0–15.0)
Immature Granulocytes: 0 %
Lymphocytes Relative: 13 %
Lymphs Abs: 1.5 10*3/uL (ref 0.7–4.0)
MCH: 25.4 pg — ABNORMAL LOW (ref 26.0–34.0)
MCHC: 29.5 g/dL — ABNORMAL LOW (ref 30.0–36.0)
MCV: 86.4 fL (ref 80.0–100.0)
Monocytes Absolute: 0.6 10*3/uL (ref 0.1–1.0)
Monocytes Relative: 5 %
Neutro Abs: 9.4 10*3/uL — ABNORMAL HIGH (ref 1.7–7.7)
Neutrophils Relative %: 81 %
Platelets: 371 10*3/uL (ref 150–400)
RBC: 3.38 MIL/uL — ABNORMAL LOW (ref 3.87–5.11)
RDW: 15.8 % — ABNORMAL HIGH (ref 11.5–15.5)
WBC: 11.7 10*3/uL — ABNORMAL HIGH (ref 4.0–10.5)
nRBC: 0 % (ref 0.0–0.2)

## 2023-05-21 LAB — GLUCOSE, CAPILLARY: Glucose-Capillary: 102 mg/dL — ABNORMAL HIGH (ref 70–99)

## 2023-05-21 LAB — HEMOGLOBIN: Hemoglobin: 7.8 g/dL — ABNORMAL LOW (ref 12.0–15.0)

## 2023-05-21 LAB — POC OCCULT BLOOD, ED: Fecal Occult Bld: POSITIVE — AB

## 2023-05-21 MED ORDER — LOSARTAN POTASSIUM 50 MG PO TABS
100.0000 mg | ORAL_TABLET | Freq: Every day | ORAL | Status: DC
  Administered 2023-05-22 – 2023-05-26 (×5): 100 mg via ORAL
  Filled 2023-05-21 (×5): qty 2

## 2023-05-21 MED ORDER — ALBUTEROL SULFATE (2.5 MG/3ML) 0.083% IN NEBU: 2.5000 mg | INHALATION_SOLUTION | Freq: Four times a day (QID) | RESPIRATORY_TRACT | Status: DC | PRN

## 2023-05-21 MED ORDER — ATORVASTATIN CALCIUM 10 MG PO TABS
20.0000 mg | ORAL_TABLET | Freq: Every morning | ORAL | Status: AC
Start: 2023-05-22 — End: ?
  Administered 2023-05-22 – 2023-05-26 (×5): 20 mg via ORAL
  Filled 2023-05-21 (×5): qty 2

## 2023-05-21 MED ORDER — VITAMIN D 25 MCG (1000 UNIT) PO TABS
2000.0000 [IU] | ORAL_TABLET | Freq: Every day | ORAL | Status: DC
  Filled 2023-05-21: qty 2

## 2023-05-21 MED ORDER — GLIPIZIDE ER 10 MG PO TB24
10.0000 mg | ORAL_TABLET | Freq: Every day | ORAL | Status: DC
  Filled 2023-05-21: qty 1

## 2023-05-21 MED ORDER — PANTOPRAZOLE SODIUM 40 MG PO TBEC
40.0000 mg | DELAYED_RELEASE_TABLET | Freq: Every day | ORAL | Status: DC
  Administered 2023-05-21 – 2023-05-26 (×6): 40 mg via ORAL
  Filled 2023-05-21 (×7): qty 1

## 2023-05-21 MED ORDER — SORBITOL 70 % SOLN: 30.0000 mL | Freq: Every day | Status: DC | PRN

## 2023-05-21 MED ORDER — VITAMIN D 25 MCG (1000 UNIT) PO TABS
2000.0000 [IU] | ORAL_TABLET | Freq: Every day | ORAL | Status: DC
  Administered 2023-05-22 – 2023-05-26 (×5): 2000 [IU] via ORAL
  Filled 2023-05-21 (×5): qty 2

## 2023-05-21 MED ORDER — METOPROLOL SUCCINATE ER 50 MG PO TB24
100.0000 mg | ORAL_TABLET | Freq: Every day | ORAL | Status: DC
  Administered 2023-05-22 – 2023-05-26 (×5): 100 mg via ORAL
  Filled 2023-05-21 (×6): qty 2

## 2023-05-21 MED ORDER — INSULIN ASPART 100 UNIT/ML IJ SOLN
0.0000 [IU] | Freq: Three times a day (TID) | INTRAMUSCULAR | Status: DC
  Administered 2023-05-24 – 2023-05-25 (×2): 1 [IU] via SUBCUTANEOUS
  Filled 2023-05-21: qty 0.09

## 2023-05-21 MED ORDER — METFORMIN HCL ER 500 MG PO TB24
500.0000 mg | ORAL_TABLET | Freq: Two times a day (BID) | ORAL | Status: DC
  Administered 2023-05-22 – 2023-05-26 (×8): 500 mg via ORAL
  Filled 2023-05-21 (×9): qty 1

## 2023-05-21 MED ORDER — LOSARTAN POTASSIUM 50 MG PO TABS
100.0000 mg | ORAL_TABLET | Freq: Every day | ORAL | Status: DC
  Filled 2023-05-21: qty 2

## 2023-05-21 MED ORDER — FERROUS SULFATE 325 (65 FE) MG PO TABS
325.0000 mg | ORAL_TABLET | Freq: Three times a day (TID) | ORAL | Status: DC
  Administered 2023-05-22 – 2023-05-26 (×13): 325 mg via ORAL
  Filled 2023-05-21 (×14): qty 1

## 2023-05-21 MED ORDER — ANASTROZOLE 1 MG PO TABS
1.0000 mg | ORAL_TABLET | Freq: Every day | ORAL | Status: DC
  Administered 2023-05-21 – 2023-05-26 (×6): 1 mg via ORAL
  Filled 2023-05-21 (×6): qty 1

## 2023-05-21 MED ORDER — ALBUTEROL SULFATE HFA 108 (90 BASE) MCG/ACT IN AERS: 2.0000 | INHALATION_SPRAY | RESPIRATORY_TRACT | Status: DC | PRN

## 2023-05-21 MED ORDER — ACETAMINOPHEN 500 MG PO TABS
1000.0000 mg | ORAL_TABLET | Freq: Four times a day (QID) | ORAL | Status: DC | PRN
  Administered 2023-05-21 – 2023-05-26 (×5): 1000 mg via ORAL
  Filled 2023-05-21 (×6): qty 2

## 2023-05-21 NOTE — H&P (Signed)
 History and Physical    Kristy Harris:[id number] DOB: 1945/11/07 DOA: 05/21/2023  PCP: Rebecka Apley, NP  Patient coming from: Home.  Lives at home with children.  I have personally briefly reviewed patient's old medical records available.   Chief Complaint: Blood in the stool.  HPI: Kristy Harris is a 78 y.o. female with medical history significant of chronic iron deficiency anemia, history of breast cancer, COPD, hypertension, hyperlipidemia, type 2 diabetes on oral hypoglycemics, history of diverticulosis presented to the emergency room with 2 episodes of bright red blood, small in frequency evident since yesterday.  Yesterday she only had a smear of blood.  Today morning she had visible blood on her toilet without stool.  Does have chronic iron recency anemia.  She is on iron supplementation.  She had required 1 unit of blood last year.  Denies any medication changes.  She felt slightly weak otherwise no other new complaints. ED Course: Blood pressure stable.  On room air.  Hemoglobin 8.6-8.8 at about baseline.  Electrolytes are adequate.  Patient hemodynamically stable. Rectal exam done by gastroenterology, as documented in their notes "Rectal: Clean bandages on both side of buttocks, tiny amount of bright red blood on patient's diaper; external exam with no fissure, no tenderness; internal exam with no mass, no residue  Seen by GI in the ER.  Recommended observation.  Anticipating conservative management.  Review of Systems: all systems are reviewed and pertinent positive as per HPI otherwise rest are negative.    Past Medical History:  Diagnosis Date   Anemia    takes iron supplement   Arthritis    knees   Breast cancer (HCC) 2016   COPD (chronic obstructive pulmonary disease) (HCC)    Enlarged heart    GERD (gastroesophageal reflux disease)    Hidradenitis suppurativa    buttocks   History of breast cancer 12/2014   Hyperlipidemia    Hypertension    states  under control with meds., has been on med. > 40 yrs.   Morbid obesity (HCC)    Non-insulin dependent type 2 diabetes mellitus (HCC)    Personal history of chemotherapy    Personal history of radiation therapy    Shortness of breath dyspnea    with exertion   Urinary frequency     Past Surgical History:  Procedure Laterality Date   ABDOMINAL HYSTERECTOMY  25 yrs ago   partial   BLADDER SUSPENSION     x 2   BREAST LUMPECTOMY Left 12/25/2014   BREAST LUMPECTOMY WITH RADIOACTIVE SEED AND SENTINEL LYMPH NODE BIOPSY Left 12/25/2014   Procedure: RADIOACTIVE SEED GIUDED LEFT BREAST LUMPECTOMY, LEFT AXILLARY SENTINEL LYMPH NODE BIOPSY;  Surgeon: [name], MD;  Location: [facility];  Service: General;  Laterality: Left;   CARDIAC CATHETERIZATION  02/21/2011   Normal coronary arteries, normal EF   COLONOSCOPY WITH PROPOFOL  11/18/2013   ESOPHAGOGASTRODUODENOSCOPY (EGD) WITH PROPOFOL  11/18/2013   EXCISION HYDRADENITIS LABIA N/A 12/08/2013   Procedure: EXCISION HIDRADENITIS PUBIC AREA;  Surgeon: [name], MD;  Location: [facility];  Service: General;  Laterality: N/A;   HYDRADENITIS EXCISION Left 12/08/2013   Procedure: EXCISION HIDRADENITIS AXILLA;  Surgeon: [name], MD;  Location: [facility];  Service: General;  Laterality: Left;   INCISION AND DRAINAGE ABSCESS  08/17/2008   perineum and buttock   IRRIGATION AND DEBRIDEMENT ABSCESS Right 12/23/2012   Procedure: incision  AND DEBRIDEMENT right buttock infection ;  Surgeon: Kandis Cocking, MD;  Location: [facility];  Service: General;  Laterality: Right;   PILONIDAL CYST / SINUS EXCISION  06/04/2004   PILONIDAL CYST EXCISION     PORT-A-CATH REMOVAL Right 03/21/2016   Procedure: MINOR REMOVAL PORT-A-CATH;  Surgeon: [name], MD;  Location: [facility];  Service: General;  Laterality: Right;  MINOR REMOVAL PORT-A-CATH   PORTACATH PLACEMENT N/A 01/12/2015   Procedure: INSERTION PORT-A-CATH;  Surgeon: [name], MD;  Location: [facility];  Service: General;  Laterality: N/A;   TONSILLECTOMY      Social history   reports that she quit smoking about 29 years ago. Her smoking use included cigarettes. She started smoking about 29 years ago. She has never used smokeless tobacco. She reports that she does not drink alcohol and does not use drugs.  Allergies  Allergen Reactions   Lisinopril Swelling and Cough    Mouth swelling   Penicillins Nausea And Vomiting and Rash    .Did it involve swelling of the face/tongue/throat, SOB, or low BP? Yes Did it involve sudden or severe rash/hives, skin peeling, or any reaction on the inside of your mouth or nose? Yes Did you need to seek medical attention at a hospital or doctor's office? Yes When did it last happen?       If all above answers are "NO", may proceed with cephalosporin use.     Family History  Problem Relation Age of Onset   Heart attack Mother        had multiple health problems   Lung cancer Father 32       smoker   Prostate cancer Brother    Lung cancer Brother        dx. 56s; smoker   Throat cancer Brother        dx. 30s; smoker   Diabetes Sister        has 3 living sisters with multiple health problems   Hypertension Son    Hypertension Daughter    Breast cancer Sister 26   Breast cancer Sister        dx. 40s   Breast cancer Maternal Grandmother        dx. 70s   Breast cancer Maternal Aunt        dx. older than 50   Dementia Maternal Aunt    Breast cancer Cousin        dx. 50s   Hypertension Brother      Prior to Admission medications   Medication Sig Start Date End Date Taking? Authorizing Provider  acetaminophen (TYLENOL) 500 MG tablet Take 1,000 mg by mouth every 6 (six) hours as needed for mild pain or headache.    [provider]  albuterol (PROVENTIL HFA;VENTOLIN HFA) 108 (90 BASE) MCG/ACT inhaler Inhale 2 puffs into the lungs every 4 (four) hours as needed for shortness of breath.    [provider]  anastrozole  (ARIMIDEX) 1 MG tablet TAKE 1 TABLET BY MOUTH EVERY DAY 03/02/23   Doreatha Massed, MD  atorvastatin (LIPITOR) 20 MG tablet Take 20 mg by mouth every morning.    [provider]  Blood Glucose Monitoring Suppl (BLOOD GLUCOSE MONITOR SYSTEM) w/Device KIT With lancets and strips #100 6rf Dx: E11.9 Test bid 05/06/17   [provider]  Calcium 150 MG TABS Take 1 tablet by mouth daily.  08/11/18   [provider]  cholecalciferol (VITAMIN D) 1000 units tablet Take 2,000 Units by mouth daily.    [provider]  ferrous  sulfate 325 (65 FE) MG tablet Take 325 mg by mouth 3 (three) times daily with meals.    [provider]  fluconazole (DIFLUCAN) 150 MG tablet Take 150 mg by mouth as needed.  08/07/17   [provider]  furosemide (LASIX) 20 MG tablet Take 20 mg by mouth once a week.    [provider]  glipiZIDE (GLUCOTROL XL) 10 MG 24 hr tablet Take 10 mg by mouth daily with breakfast.  02/18/17   [provider]  HYDROcodone-acetaminophen (NORCO/VICODIN) 5-325 MG tablet Take 1 tablet by mouth 2 (two) times daily as needed.    [provider]  losartan (COZAAR) 100 MG tablet Take 100 mg by mouth daily.    [provider]  metFORMIN (GLUCOPHAGE-XR) 500 MG 24 hr tablet TAKE ONE TABLET BY MOUTH TWICE DAILY 03/24/16   [provider]  metoprolol succinate (TOPROL-XL) 100 MG 24 hr tablet TAKE ONE (1) TABLET EACH DAY 10/25/15   [provider]  Misc. Devices MISC Underpad's 6 Bags/15-90 per month  Incontinence Pads 3 Bags/20-60 per month  2 Box of Gloves per month  40 box of gauze  20 rolls of tape  Nutrition supplement/30 day supply 07/03/15   [provider]  mupirocin ointment (BACTROBAN) 2 % SMARTSIG:1 Application Topical 2-3 Times Daily 11/07/19   [provider]  pantoprazole (PROTONIX) 40 MG tablet TAKE 1 TABLET (40 MG TOTAL) BY MOUTH DAILY. Patient taking differently: Take 40  mg by mouth as needed. 02/06/15   Napoleon Form, MD  polyethylene glycol (MIRALAX / GLYCOLAX) packet Take 17 g by mouth daily as needed for mild constipation. 05/30/14   Alison Murray, MD  Secukinumab, 300 MG Dose, (COSENTYX, 300 MG DOSE,) 150 MG/ML SOSY Inject 300 mg into the skin every 30 (thirty) days.    [provider]  sodium chloride irrigation 0.9 % irrigation USE AS DIRECTED 08/06/17   [provider]  sulfamethoxazole-trimethoprim (BACTRIM DS) 800-160 MG tablet Take 1 tablet by mouth 2 (two) times daily. 11/08/19   [provider]  verapamil (CALAN-SR) 120 MG CR tablet Take 120 mg by mouth daily. 12/30/14   [provider]    Physical Exam: Vitals:   05/21/23 1126 05/21/23 1128 05/21/23 1654 05/21/23 1656  BP: (!) 129/58   (!) 111/50  Pulse: 69   70  Resp: 18   16  Temp:  98.1 F (36.7 C) 98.4 F (36.9 C)   TempSrc:  Oral Oral   SpO2: 100%   98%  Weight: 89.1 kg     Height: 5\' 3"  (1.6 m)       Constitutional: NAD, calm, comfortable Vitals:   05/21/23 1126 05/21/23 1128 05/21/23 1654 05/21/23 1656  BP: (!) 129/58   (!) 111/50  Pulse: 69   70  Resp: 18   16  Temp:  98.1 F (36.7 C) 98.4 F (36.9 C)   TempSrc:  Oral Oral   SpO2: 100%   98%  Weight: 89.1 kg     Height: 5\' 3"  (1.6 m)      Cardiovascular: S1-S2 normal.  Regular rate rhythm. Respiratory: Bilateral clear. Gastrointestinal: Soft.  Nontender.  Bowel sound present.  Rectal exam as above. Ext: No edema.  No swelling. Neuro: Alert awake and oriented.  Frail.  Pleasant interaction. Musculoskeletal: No deformities.      Labs on Admission: I have personally reviewed following labs and imaging studies  CBC: Recent Labs  Lab 05/21/23 1200 05/21/23 1634  05/21/23 1640  WBC 11.7*  --   --   NEUTROABS 9.4*  --   --   HGB 8.6* 7.8* 8.8*  HCT 29.2*  --  26.0*  MCV 86.4  --   --   PLT 371  --   --    Basic Metabolic Panel: Recent Labs  Lab 05/21/23 1200  05/21/23 1640  NA 135 135  K 5.2* 4.5  CL 102 103  CO2 21*  --   GLUCOSE 109* 126*  BUN 20 19  CREATININE 0.82 0.90  CALCIUM 8.9  --    GFR: Estimated Creatinine Clearance: 57.2 mL/min (by C-G formula based on SCr of 0.9 mg/dL). Liver Function Tests: Recent Labs  Lab 05/21/23 1200  AST 13*  ALT 9  ALKPHOS 77  BILITOT 0.9  PROT 7.6  ALBUMIN 3.2*   No results for input(s): "LIPASE", "AMYLASE" in the last 168 hours. No results for input(s): "AMMONIA" in the last 168 hours. Coagulation Profile: No results for input(s): "INR", "PROTIME" in the last 168 hours. Cardiac Enzymes: No results for input(s): "CKTOTAL", "CKMB", "CKMBINDEX", "TROPONINI" in the last 168 hours. BNP (last 3 results) No results for input(s): "PROBNP" in the last 8760 hours. HbA1C: No results for input(s): "HGBA1C" in the last 72 hours. CBG: No results for input(s): "GLUCAP" in the last 168 hours. Lipid Profile: No results for input(s): "CHOL", "HDL", "LDLCALC", "TRIG", "CHOLHDL", "LDLDIRECT" in the last 72 hours. Thyroid Function Tests: No results for input(s): "TSH", "T4TOTAL", "FREET4", "T3FREE", "THYROIDAB" in the last 72 hours. Anemia Panel: No results for input(s): "VITAMINB12", "FOLATE", "FERRITIN", "TIBC", "IRON", "RETICCTPCT" in the last 72 hours. Urine analysis:    Component Value Date/Time   COLORURINE YELLOW 05/28/2014 1140   APPEARANCEUR CLOUDY (A) 05/28/2014 1140   LABSPEC 1.021 05/28/2014 1140   PHURINE 6.0 05/28/2014 1140   GLUCOSEU NEGATIVE 05/28/2014 1140   HGBUR TRACE (A) 05/28/2014 1140   BILIRUBINUR NEGATIVE 05/28/2014 1140   KETONESUR NEGATIVE 05/28/2014 1140   PROTEINUR NEGATIVE 05/28/2014 1140   UROBILINOGEN 0.2 05/28/2014 1140   NITRITE NEGATIVE 05/28/2014 1140   LEUKOCYTESUR MODERATE (A) 05/28/2014 1140    Radiological Exams on Admission: No results found.  EKG: Independently reviewed.  Available from previous visit on 04/03/2023.  Sinus rhythm.  Normal  QTc.  Assessment/Plan Principal Problem:   Hematochezia Active Problems:   Hypertension   Hyperlipidemia   Diabetes mellitus (HCC)   Diverticulosis of colon with hemorrhage     1.  Hematochezia in a patient with known diverticulosis.  Chronic iron-deficiency anemia: Observation.  Clear liquid diet.  Repeat hemoglobin tomorrow morning.  Transfuse if less than 7.  Continue iron supplementation.  GI following.  If hemoglobin remains stable and no further bleeding, recommending outpatient colonoscopy as elective procedure. If patient has frank bleeding, will do CT angio bleeding scan. GI continues to follow.  2.  Type 2 diabetes: On metformin and glipizide at home.  Continue.  Monitor blood sugars and keep on low-dose sliding scale insulin.  3.  Hyperlipidemia: Continue statin.  4.  Hypertension: Blood pressure currently stable.  Will resume beta-blockers and losartan.  Holding verapamil.  5.  COPD: Stable.  Albuterol as needed.   DVT prophylaxis: SCDs Code Status: Full code Family Communication: Daughters at the bedside Disposition Plan: Home. Consults called: Gastroenterology, [facility]: Admission status: Observation.  MedSurg.   Dorcas Carrow MD [facility] Pager [phone]

## 2023-05-21 NOTE — ED Provider Notes (Signed)
  Physical Exam  BP (!) 111/50   Pulse 70   Temp 98.4 F (36.9 C) (Oral)   Resp 16   Ht 5\' 3"  (1.6 m)   Wt 89.1 kg   SpO2 98%   BMI 34.80 kg/m   Physical Exam Vitals and nursing note reviewed.  Constitutional:      General: She is not in acute distress.    Appearance: She is well-developed.  HENT:     Head: Normocephalic and atraumatic.  Eyes:     Conjunctiva/sclera: Conjunctivae normal.  Pulmonary:     Effort: Pulmonary effort is normal. No respiratory distress.  Musculoskeletal:        General: No swelling.     Cervical back: Neck supple.  Skin:    General: Skin is warm and dry.     Capillary Refill: Capillary refill takes less than 2 seconds.  Neurological:     Mental Status: She is alert.  Psychiatric:        Mood and Affect: Mood normal.     Procedures  Procedures  ED Course / MDM   Clinical Course as of 05/21/23 1842  Thu May 21, 2023  1535 Discussed with Dr. Elnoria Howard, will consult.  [WS]  [phone] Signed out to Dr. Posey Rea pending labs.  [WS]    Clinical Course User Index [WS] Lonell Grandchild, MD   Medical Decision Making Amount and/or Complexity of Data Reviewed Labs: ordered.  Risk Decision regarding hospitalization.   Patient received in handoff.  GI bleed requiring hospital admission but pending chemistry.  Chemistry reassuringly unremarkable.  Patient admitted       Glendora Score, MD 05/21/23 [phone]

## 2023-05-21 NOTE — ED Provider Notes (Signed)
 [place] [facility] GENERAL SURGERY Provider Note  CSN: [id number] Arrival date & time: 05/21/23 1118  Chief Complaint(s) Rectal Bleeding  HPI Kristy Harris is a 78 y.o. female history of chronic anemia, COPD, hidradenitis, hypertension, bulimia presenting to the emergency department with rectal bleeding.  Patient reports rectal bleeding, possibly slight amount yesterday but mostly this morning when defecating.  Reports of bright red blood.  Not taking blood thinner.  Reports sensation of mild weakness but otherwise denies any chest pain, abdominal pain, fevers or chills, syncope, shortness of breath, nausea, vomiting.  Has had previous episode, due to diverticulosis   Past Medical History Past Medical History:  Diagnosis Date   Anemia    takes iron supplement   Arthritis    knees   Breast cancer (HCC) 2016   COPD (chronic obstructive pulmonary disease) (HCC)    Enlarged heart    GERD (gastroesophageal reflux disease)    Hidradenitis suppurativa    buttocks   History of breast cancer 12/2014   Hyperlipidemia    Hypertension    states under control with meds., has been on med. > 40 yrs.   Morbid obesity (HCC)    Non-insulin dependent type 2 diabetes mellitus (HCC)    Personal history of chemotherapy    Personal history of radiation therapy    Shortness of breath dyspnea    with exertion   Urinary frequency    Patient Active Problem List   Diagnosis Date Noted   Hematochezia 05/21/2023   Aortic atherosclerosis (HCC) 01/10/2021   Pressure injury of skin 11/03/2018   Swelling of left hand 11/02/2018   Osteopenia 08/14/2017   Genetic testing 01/23/2015   Family history of breast cancer 01/09/2015   Encounter for pre-operative cardiovascular clearance 11/23/2014   Aortic stenosis 11/23/2014   Normal coronary arteries 11/23/2014   Morbid obesity (HCC) 11/23/2014   DJD (degenerative joint disease) 11/23/2014   Breast cancer of upper-outer quadrant of  left female breast (HCC) 11/13/2014   Diverticulosis of colon with hemorrhage    H. pylori infection 05/28/2014   Abscess of skin and subcutaneous tissue 12/08/2013   Iron deficiency anemia 09/19/2013   Abscess of left axilla 08/11/2013   Abscess of left thigh 05/26/2013   Abscess of buttock, right 06/13/2011   Arteritis (HCC) 02/27/2011   Abnormal cardiovascular function study 02/15/2011   Peripheral vascular disease (HCC) 02/15/2011   Diabetes mellitus (HCC) 02/15/2011   Dyspnea 01/24/2011   Hypertension 01/24/2011   Hyperlipidemia 01/24/2011   Obesity 01/24/2011   Bruit 01/24/2011   Murmur 01/24/2011   Home Medication(s) Prior to Admission medications   Medication Sig Start Date End Date Taking? Authorizing Provider  acetaminophen (TYLENOL) 500 MG tablet Take 1,000 mg by mouth every 6 (six) hours as needed for mild pain or headache.   Yes [provider]  albuterol (PROVENTIL HFA;VENTOLIN HFA) 108 (90 BASE) MCG/ACT inhaler Inhale 2 puffs into the lungs every 4 (four) hours as needed for shortness of breath.   Yes [provider]  anastrozole (ARIMIDEX) 1 MG tablet TAKE 1 TABLET BY MOUTH EVERY DAY 03/02/23  Yes Doreatha Massed, MD  atorvastatin (LIPITOR) 20 MG tablet Take 20 mg by mouth every morning.   Yes [provider]  Calcium 150 MG TABS Take 1 tablet by mouth daily.  08/11/18  Yes [provider]  cholecalciferol (VITAMIN D) 1000 units tablet Take 2,000 Units by mouth daily.   Yes [provider]  COSENTYX UNOREADY 300 MG/2ML  SOAJ Inject 300 mg into the skin every 14 (fourteen) days. 05/21/23  Yes [provider]  ferrous sulfate 325 (65 FE) MG tablet Take 325 mg by mouth 3 (three) times daily with meals.   Yes [provider]  fluconazole (DIFLUCAN) 150 MG tablet Take 150 mg by mouth as needed.  08/07/17  Yes [provider]  HYDROcodone-acetaminophen (NORCO/VICODIN) 5-325 MG tablet Take 1 tablet by  mouth 2 (two) times daily as needed.   Yes [provider]  losartan (COZAAR) 100 MG tablet Take 100 mg by mouth daily.   Yes [provider]  metFORMIN (GLUCOPHAGE-XR) 500 MG 24 hr tablet TAKE ONE TABLET BY MOUTH TWICE DAILY 03/24/16  Yes [provider]  metoprolol succinate (TOPROL-XL) 100 MG 24 hr tablet Take 100 mg by mouth daily. 10/25/15  Yes [provider]  mupirocin ointment (BACTROBAN) 2 % SMARTSIG:1 Application Topical 2-3 Times Daily 11/07/19  Yes [provider]  pantoprazole (PROTONIX) 40 MG tablet TAKE 1 TABLET (40 MG TOTAL) BY MOUTH DAILY. Patient taking differently: Take 40 mg by mouth as needed. 02/06/15  Yes Nandigam, Eleonore Chiquito, MD  polyethylene glycol (MIRALAX / GLYCOLAX) packet Take 17 g by mouth daily as needed for mild constipation. 05/30/14  Yes Alison Murray, MD  sodium chloride irrigation 0.9 % irrigation USE AS DIRECTED 08/06/17  Yes [provider]  sulfamethoxazole-trimethoprim (BACTRIM DS) 800-160 MG tablet Take 1 tablet by mouth 2 (two) times daily. 11/08/19  Yes [provider]  verapamil (CALAN-SR) 120 MG CR tablet Take 120 mg by mouth daily. 12/30/14  Yes [provider]  vitamin B-12 (CYANOCOBALAMIN) 100 MCG tablet Take 100 mcg by mouth daily.   Yes [provider]  Blood Glucose Monitoring Suppl (BLOOD GLUCOSE MONITOR SYSTEM) w/Device KIT With lancets and strips #100 6rf Dx: E11.9 Test bid 05/06/17   [provider]  glipiZIDE (GLUCOTROL XL) 10 MG 24 hr tablet Take 10 mg by mouth daily with breakfast.  Patient not taking: Reported on 05/21/2023 02/18/17   [provider]                                                                                                                                    Past Surgical History Past Surgical History:  Procedure Laterality Date   ABDOMINAL HYSTERECTOMY  25 yrs ago   partial   BLADDER SUSPENSION     x 2   BREAST LUMPECTOMY Left  12/25/2014   BREAST LUMPECTOMY WITH RADIOACTIVE SEED AND SENTINEL LYMPH NODE BIOPSY Left 12/25/2014   Procedure: RADIOACTIVE SEED GIUDED LEFT BREAST LUMPECTOMY, LEFT AXILLARY SENTINEL LYMPH NODE BIOPSY;  Surgeon: [name], MD;  Location: [facility] OR;  Service: General;  Laterality: Left;   CARDIAC CATHETERIZATION  02/21/2011   Normal coronary arteries, normal EF   COLONOSCOPY WITH PROPOFOL  11/18/2013   ESOPHAGOGASTRODUODENOSCOPY (EGD) WITH PROPOFOL  11/18/2013   EXCISION HYDRADENITIS LABIA  N/A 12/08/2013   Procedure: EXCISION HIDRADENITIS PUBIC AREA;  Surgeon: [name], MD;  Location: [facility] ORS;  Service: General;  Laterality: N/A;   HYDRADENITIS EXCISION Left 12/08/2013   Procedure: EXCISION HIDRADENITIS AXILLA;  Surgeon: [name], MD;  Location: [facility]S;  Service: General;  Laterality: Left;   INCISION AND DRAINAGE ABSCESS  08/17/2008   perineum and buttock   IRRIGATION AND DEBRIDEMENT ABSCESS Right 12/23/2012   Procedure: incision  AND DEBRIDEMENT right buttock infection ;  Surgeon: Kandis Cocking, MD;  Location: [facility];  Service: General;  Laterality: Right;   PILONIDAL CYST / SINUS EXCISION  06/04/2004   PILONIDAL CYST EXCISION     PORT-A-CATH REMOVAL Right 03/21/2016   Procedure: MINOR REMOVAL PORT-A-CATH;  Surgeon: [name], MD;  Location: [facility];  Service: General;  Laterality: Right;  MINOR REMOVAL PORT-A-CATH   PORTACATH PLACEMENT N/A 01/12/2015   Procedure: INSERTION PORT-A-CATH;  Surgeon: [name], MD;  Location: [facility];  Service: General;  Laterality: N/A;   TONSILLECTOMY     Family History Family History  Problem Relation Age of Onset   Heart attack Mother        had multiple health problems   Lung cancer Father 43       smoker   Prostate cancer Brother    Lung cancer Brother        dx. 49s; smoker   Throat cancer Brother        dx. 30s; smoker   Diabetes Sister        has 3 living sisters with multiple health problems   Hypertension  Son    Hypertension Daughter    Breast cancer Sister 60   Breast cancer Sister        dx. 40s   Breast cancer Maternal Grandmother        dx. 70s   Breast cancer Maternal Aunt        dx. older than 50   Dementia Maternal Aunt    Breast cancer Cousin        dx. 50s   Hypertension Brother     Social History Social History   Tobacco Use   Smoking status: Former    Current packs/day: 0.00    Types: Cigarettes    Start date: 03/03/1994    Quit date: 03/03/1994    Years since quitting: 29.2   Smokeless tobacco: Never  Vaping Use   Vaping status: Never Used  Substance Use Topics   Alcohol use: No   Drug use: No   Allergies Lisinopril and Penicillins  Review of Systems Review of Systems  All other systems reviewed and are negative.   Physical Exam Vital Signs  I have reviewed the triage vital signs BP (!) 121/47 (BP Location: Right Arm)   Pulse 76   Temp 97.7 F (36.5 C)   Resp 18   Ht 5\' 3"  (1.6 m)   Wt 89.1 kg   SpO2 97%   BMI 34.80 kg/m  Physical Exam Vitals and nursing note reviewed.  Constitutional:      General: She is not in acute distress.    Appearance: She is well-developed.  HENT:     Head: Normocephalic and atraumatic.     Mouth/Throat:     Mouth: Mucous membranes are moist.  Eyes:     Pupils: Pupils are equal, round, and reactive to light.  Cardiovascular:     Rate and Rhythm: Normal rate and regular rhythm.  Heart sounds: No murmur heard. Pulmonary:     Effort: Pulmonary effort is normal. No respiratory distress.     Breath sounds: Normal breath sounds.  Abdominal:     General: Abdomen is flat.     Palpations: Abdomen is soft.     Tenderness: There is no abdominal tenderness.  Genitourinary:    Comments: Chaperoned by RN, scattered chronic hidradenitis wound on both buttocks without signs of infection, rectal exam with maroon stool Musculoskeletal:        General: No tenderness.     Right lower leg: No edema.     Left lower leg: No  edema.  Skin:    General: Skin is warm and dry.  Neurological:     General: No focal deficit present.     Mental Status: She is alert. Mental status is at baseline.  Psychiatric:        Mood and Affect: Mood normal.        Behavior: Behavior normal.     ED Results and Treatments Labs (all labs ordered are listed, but only abnormal results are displayed) Labs Reviewed  COMPREHENSIVE METABOLIC PANEL - Abnormal; Notable for the following components:      Result Value   Potassium 5.2 (*)    CO2 21 (*)    Glucose, Bld 109 (*)    Albumin 3.2 (*)    AST 13 (*)    All other components within normal limits  CBC WITH DIFFERENTIAL/PLATELET - Abnormal; Notable for the following components:   WBC 11.7 (*)    RBC 3.38 (*)    Hemoglobin 8.6 (*)    HCT 29.2 (*)    MCH 25.4 (*)    MCHC 29.5 (*)    RDW 15.8 (*)    Neutro Abs 9.4 (*)    All other components within normal limits  HEMOGLOBIN - Abnormal; Notable for the following components:   Hemoglobin 7.8 (*)    All other components within normal limits  HEMOGLOBIN - Abnormal; Notable for the following components:   Hemoglobin 7.3 (*)    All other components within normal limits  COMPREHENSIVE METABOLIC PANEL - Abnormal; Notable for the following components:   Calcium 8.6 (*)    Albumin 3.0 (*)    AST 9 (*)    All other components within normal limits  GLUCOSE, CAPILLARY - Abnormal; Notable for the following components:   Glucose-Capillary 102 (*)    All other components within normal limits  GLUCOSE, CAPILLARY - Abnormal; Notable for the following components:   Glucose-Capillary 102 (*)    All other components within normal limits  POC OCCULT BLOOD, ED - Abnormal; Notable for the following components:   Fecal Occult Bld POSITIVE (*)    All other components within normal limits  I-STAT CHEM 8, ED - Abnormal; Notable for the following components:   Glucose, Bld 126 (*)    Hemoglobin 8.8 (*)    HCT 26.0 (*)    All other  components within normal limits  HEMOGLOBIN  TYPE AND SCREEN  Radiology No results found.  Pertinent labs & imaging results that were available during my care of the patient were reviewed by me and considered in my medical decision making (see MDM for details).  Medications Ordered in ED Medications  acetaminophen (TYLENOL) tablet 1,000 mg (1,000 mg Oral Given 05/21/23 2147)  glipiZIDE (GLUCOTROL XL) 24 hr tablet 10 mg (has no administration in time range)  metFORMIN (GLUCOPHAGE-XR) 24 hr tablet 500 mg (has no administration in time range)  atorvastatin (LIPITOR) tablet 20 mg (has no administration in time range)  anastrozole (ARIMIDEX) tablet 1 mg (1 mg Oral Given 05/21/23 2150)  metoprolol succinate (TOPROL-XL) 24 hr tablet 100 mg (100 mg Oral Patient Refused/Not Given 05/21/23 2042)  ferrous sulfate tablet 325 mg (has no administration in time range)  pantoprazole (PROTONIX) EC tablet 40 mg (40 mg Oral Given 05/21/23 2147)  sorbitol 70 % solution 30 mL (has no administration in time range)  insulin aspart (novoLOG) injection 0-9 Units (has no administration in time range)  albuterol (PROVENTIL) (2.5 MG/3ML) 0.083% nebulizer solution 2.5 mg (has no administration in time range)  losartan (COZAAR) tablet 100 mg (has no administration in time range)  cholecalciferol (VITAMIN D3) 25 MCG (1000 UNIT) tablet 2,000 Units (has no administration in time range)                                                                                                                                     Procedures Procedures  (including critical care time)  Medical Decision Making / ED Course   MDM:  78 year old presenting to the emergency department rectal bleeding.  On exam, patient does have evidence of rectal bleeding.  Abdomen is benign.  Likely due to lower GI bleeding probably  from diverticular source given history.  Patient not on anticoagulation.  Will check labs including CBC, type and screen..  Patient has chronic anemia, may need transfusion.  Given chronic anemia may need observation  Clinical Course as of 05/22/23 0843  Thu May 21, 2023  1535 Discussed with Dr. Elnoria Howard, will consult.  [WS]  [phone] Signed out to Dr. Posey Rea pending labs.  [WS]    Clinical Course User Index [WS] Lonell Grandchild, MD     Additional history obtained: -Additional history obtained from ems -External records from outside source obtained and reviewed including: Chart review including previous notes, labs, imaging, consultation notes including prior colonoscopy   Lab Tests: -I ordered, reviewed, and interpreted labs.   The pertinent results include:   Labs Reviewed  COMPREHENSIVE METABOLIC PANEL - Abnormal; Notable for the following components:      Result Value   Potassium 5.2 (*)    CO2 21 (*)    Glucose, Bld 109 (*)    Albumin 3.2 (*)    AST 13 (*)    All other components within normal limits  CBC WITH DIFFERENTIAL/PLATELET - Abnormal; Notable for the following components:  WBC 11.7 (*)    RBC 3.38 (*)    Hemoglobin 8.6 (*)    HCT 29.2 (*)    MCH 25.4 (*)    MCHC 29.5 (*)    RDW 15.8 (*)    Neutro Abs 9.4 (*)    All other components within normal limits  HEMOGLOBIN - Abnormal; Notable for the following components:   Hemoglobin 7.8 (*)    All other components within normal limits  HEMOGLOBIN - Abnormal; Notable for the following components:   Hemoglobin 7.3 (*)    All other components within normal limits  COMPREHENSIVE METABOLIC PANEL - Abnormal; Notable for the following components:   Calcium 8.6 (*)    Albumin 3.0 (*)    AST 9 (*)    All other components within normal limits  GLUCOSE, CAPILLARY - Abnormal; Notable for the following components:   Glucose-Capillary 102 (*)    All other components within normal limits  GLUCOSE, CAPILLARY - Abnormal;  Notable for the following components:   Glucose-Capillary 102 (*)    All other components within normal limits  POC OCCULT BLOOD, ED - Abnormal; Notable for the following components:   Fecal Occult Bld POSITIVE (*)    All other components within normal limits  I-STAT CHEM 8, ED - Abnormal; Notable for the following components:   Glucose, Bld 126 (*)    Hemoglobin 8.8 (*)    HCT 26.0 (*)    All other components within normal limits  HEMOGLOBIN  TYPE AND SCREEN    Notable for +FOBT, chronic anemia   EKG   EKG Interpretation Date/Time:    Ventricular Rate:    PR Interval:    QRS Duration:    QT Interval:    QTC Calculation:   R Axis:      Text Interpretation:            Medicines ordered and prescription drug management: Meds ordered this encounter  Medications   acetaminophen (TYLENOL) tablet 1,000 mg   DISCONTD: albuterol (VENTOLIN HFA) 108 (90 Base) MCG/ACT inhaler 2 puff   glipiZIDE (GLUCOTROL XL) 24 hr tablet 10 mg   metFORMIN (GLUCOPHAGE-XR) 24 hr tablet 500 mg   atorvastatin (LIPITOR) tablet 20 mg   DISCONTD: losartan (COZAAR) tablet 100 mg   anastrozole (ARIMIDEX) tablet 1 mg   metoprolol succinate (TOPROL-XL) 24 hr tablet 100 mg   ferrous sulfate tablet 325 mg   pantoprazole (PROTONIX) EC tablet 40 mg   DISCONTD: cholecalciferol (VITAMIN D3) 25 MCG (1000 UNIT) tablet 2,000 Units   sorbitol 70 % solution 30 mL   insulin aspart (novoLOG) injection 0-9 Units    Correction coverage::   Sensitive (thin, NPO, renal)    CBG < 70::   Implement Hypoglycemia Standing Orders and refer to Hypoglycemia Standing Orders sidebar report    CBG 70 - 120::   0 units    CBG 121 - 150::   1 unit    CBG 151 - 200::   2 units    CBG 201 - 250::   3 units    CBG 251 - 300::   5 units    CBG 301 - 350::   7 units    CBG 351 - 400:   9 units    CBG > 400:   call MD and obtain STAT lab verification   albuterol (PROVENTIL) (2.5 MG/3ML) 0.083% nebulizer solution 2.5 mg    losartan (COZAAR) tablet 100 mg   cholecalciferol (VITAMIN D3) 25 MCG (1000  UNIT) tablet 2,000 Units    -I have reviewed the patients home medicines and have made adjustments as needed   Consultations Obtained: I requested consultation with the gastroenterologist,  and discussed lab and imaging findings as well as pertinent plan - they recommend: admission   Cardiac Monitoring: The patient was maintained on a cardiac monitor.  I personally viewed and interpreted the cardiac monitored which showed an underlying rhythm of: NSR  Social Determinants of Health:  Diagnosis or treatment significantly limited by social determinants of health: obesity   Reevaluation: After the interventions noted above, I reevaluated the patient and found that their symptoms have stayed the same  Co morbidities that complicate the patient evaluation  Past Medical History:  Diagnosis Date   Anemia    takes iron supplement   Arthritis    knees   Breast cancer (HCC) 2016   COPD (chronic obstructive pulmonary disease) (HCC)    Enlarged heart    GERD (gastroesophageal reflux disease)    Hidradenitis suppurativa    buttocks   History of breast cancer 12/2014   Hyperlipidemia    Hypertension    states under control with meds., has been on med. > 40 yrs.   Morbid obesity (HCC)    Non-insulin dependent type 2 diabetes mellitus (HCC)    Personal history of chemotherapy    Personal history of radiation therapy    Shortness of breath dyspnea    with exertion   Urinary frequency       Dispostion: Disposition decision including need for hospitalization was considered, and patient admitted to the hospital.    Final Clinical Impression(s) / ED Diagnoses Final diagnoses:  Lower GI bleed     This chart was dictated using voice recognition software.  Despite best efforts to proofread,  errors can occur which can change the documentation meaning.    Lonell Grandchild, MD 05/22/23 [phone]

## 2023-05-21 NOTE — ED Notes (Signed)
 Unsuccessful second IV access attempt.  [place] IV requested

## 2023-05-21 NOTE — ED Notes (Signed)
 IV removed after attempting to obtain labs. Swelling noted while saline being flushed through.

## 2023-05-21 NOTE — Consult Note (Addendum)
 Consultation  Referring Provider: Dr. Suezanne Jacquet    Primary Care Physician:  Rebecka Apley, NP Primary Gastroenterologist:   Dr.  Arlyce Dice Reason for Consultation: Hematochezia         HPI:   Kristy Harris is a 78 y.o. female with a past medical history as listed below including breast cancer, chronic anemia, COPD, hidradenitis, hypertension, bulimia and multiple others who presented to the ER today with complaint of rectal bleeding.    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  Anemia noted from chronic inflammation from hidradenitis flareups of mild CKD.  She did receive 1 unit of blood in August 2024.  Maintain on 2 iron tablets daily.  Oncology had discussed possible bone marrow biopsy but she had declined.    At time of presentation patient described rectal bleeding with possible slight amount yesterday but mostly this morning when passing a stool.  Describes bright red blood.  Associated symptoms include mild weakness.    Today at time of my interview, patient explains that yesterday she had a bowel movement and had to strain a little bit and felt like maybe she saw a little bit of bright red blood in the toilet but did not think much of it.  This morning around 8 and also 9:00 AM she thought she had to have a bowel movement and just saw bright red blood that filled the toilet.  She has not had any since.  Tells me she had a similar instance of this over 10 years ago and was found to have a diverticular bleed.  This felt similar.  Does note some abdominal cramping before bowel movement this morning but better after.  Tells me she always battles with some straining due to being on chronic iron for anemia.  Denies any blood thinners.  No NSAID use.    Denies fever, chills,  weight loss, nausea or vomiting.  ER course: Rectal exam with scattered chronic hidradenitis wound on both buttocks without signs of infection, rectal exam with maroon stool, Hemoccult positive, CBC with a hemoglobin of 8.6 (appears to be around baseline over the past 6 months) most recently 8.82 weeks ago.,  WBC minimally elevated at 11.7, MCV normal.,  Platelets normal.  CMP still in progress.  GI history: 11/18/2013 colonoscopy for IDA with severe diverticulosis in the sigmoid colon otherwise normal.  Repeat recommended 10 years 11/18/2013 EGD with gastritis.  Past Medical History:  Diagnosis Date   Anemia    takes iron supplement   Arthritis    knees   Breast cancer (HCC) 2016   COPD (chronic obstructive pulmonary disease) (HCC)    Enlarged heart    GERD (gastroesophageal reflux disease)    Hidradenitis suppurativa    buttocks   History of breast cancer 12/2014   Hyperlipidemia    Hypertension    states under control with meds., has been on med. > 40 yrs.   Morbid obesity (HCC)    Non-insulin dependent type 2 diabetes mellitus (HCC)    Personal history of chemotherapy    Personal history of radiation therapy    Shortness of breath dyspnea    with exertion   Urinary frequency     Past Surgical History:  Procedure Laterality Date   ABDOMINAL HYSTERECTOMY  25 yrs ago  partial   BLADDER SUSPENSION     x 2   BREAST LUMPECTOMY Left 12/25/2014   BREAST LUMPECTOMY WITH RADIOACTIVE SEED AND SENTINEL LYMPH NODE BIOPSY Left 12/25/2014   Procedure: RADIOACTIVE SEED GIUDED LEFT BREAST LUMPECTOMY, LEFT AXILLARY SENTINEL LYMPH NODE BIOPSY;  Surgeon: [name], MD;  Location: [facility];  Service: General;  Laterality: Left;   CARDIAC CATHETERIZATION  02/21/2011   Normal coronary arteries, normal EF   COLONOSCOPY WITH PROPOFOL  11/18/2013   ESOPHAGOGASTRODUODENOSCOPY (EGD) WITH PROPOFOL  11/18/2013   EXCISION HYDRADENITIS LABIA N/A 12/08/2013   Procedure: EXCISION HIDRADENITIS PUBIC  AREA;  Surgeon: [name], MD;  Location: [facility];  Service: General;  Laterality: N/A;   HYDRADENITIS EXCISION Left 12/08/2013   Procedure: EXCISION HIDRADENITIS AXILLA;  Surgeon: [name], MD;  Location: [facility];  Service: General;  Laterality: Left;   INCISION AND DRAINAGE ABSCESS  08/17/2008   perineum and buttock   IRRIGATION AND DEBRIDEMENT ABSCESS Right 12/23/2012   Procedure: incision  AND DEBRIDEMENT right buttock infection ;  Surgeon: Kandis Cocking, MD;  Location: [facility];  Service: General;  Laterality: Right;   PILONIDAL CYST / SINUS EXCISION  06/04/2004   PILONIDAL CYST EXCISION     PORT-A-CATH REMOVAL Right 03/21/2016   Procedure: MINOR REMOVAL PORT-A-CATH;  Surgeon: [name], MD;  Location: [facility];  Service: General;  Laterality: Right;  MINOR REMOVAL PORT-A-CATH   PORTACATH PLACEMENT N/A 01/12/2015   Procedure: INSERTION PORT-A-CATH;  Surgeon: [name], MD;  Location: [facility];  Service: General;  Laterality: N/A;   TONSILLECTOMY      Family History  Problem Relation Age of Onset   Heart attack Mother        had multiple health problems   Lung cancer Father 45       smoker   Prostate cancer Brother    Lung cancer Brother        dx. 25s; smoker   Throat cancer Brother        dx. 30s; smoker   Diabetes Sister        has 3 living sisters with multiple health problems   Hypertension Son    Hypertension Daughter    Breast cancer Sister 54   Breast cancer Sister        dx. 40s   Breast cancer Maternal Grandmother        dx. 70s   Breast cancer Maternal Aunt        dx. older than 50   Dementia Maternal Aunt    Breast cancer Cousin        dx. 50s   Hypertension Brother     Social History   Tobacco Use   Smoking status: Former    Current packs/day: 0.00    Types: Cigarettes    Start date: 03/03/1994    Quit date: 03/03/1994    Years since quitting: 29.2   Smokeless tobacco: Never  Vaping Use   Vaping status: Never Used   Substance Use Topics   Alcohol use: No   Drug use: No    Prior to Admission medications   Medication Sig Start Date End Date Taking? Authorizing Provider  acetaminophen (TYLENOL) 500 MG tablet Take 1,000 mg by mouth every 6 (six) hours as needed for mild pain or headache.    [provider]  albuterol (PROVENTIL HFA;VENTOLIN HFA) 108 (90 BASE) MCG/ACT inhaler Inhale 2 puffs into the lungs every 4 (four) hours as needed for shortness of  breath.    [provider]  anastrozole (ARIMIDEX) 1 MG tablet TAKE 1 TABLET BY MOUTH EVERY DAY 03/02/23   Doreatha Massed, MD  atorvastatin (LIPITOR) 20 MG tablet Take 20 mg by mouth every morning.    [provider]  Blood Glucose Monitoring Suppl (BLOOD GLUCOSE MONITOR SYSTEM) w/Device KIT With lancets and strips #100 6rf Dx: E11.9 Test bid 05/06/17   [provider]  Calcium 150 MG TABS Take 1 tablet by mouth daily.  08/11/18   [provider]  cholecalciferol (VITAMIN D) 1000 units tablet Take 2,000 Units by mouth daily.    [provider]  ferrous sulfate 325 (65 FE) MG tablet Take 325 mg by mouth 3 (three) times daily with meals.    [provider]  fluconazole (DIFLUCAN) 150 MG tablet Take 150 mg by mouth as needed.  08/07/17   [provider]  furosemide (LASIX) 20 MG tablet Take 20 mg by mouth once a week.    [provider]  glipiZIDE (GLUCOTROL XL) 10 MG 24 hr tablet Take 10 mg by mouth daily with breakfast.  02/18/17   [provider]  HYDROcodone-acetaminophen (NORCO/VICODIN) 5-325 MG tablet Take 1 tablet by mouth 2 (two) times daily as needed.    [provider]  losartan (COZAAR) 100 MG tablet Take 100 mg by mouth daily.    [provider]  metFORMIN (GLUCOPHAGE-XR) 500 MG 24 hr tablet TAKE ONE TABLET BY MOUTH TWICE DAILY 03/24/16   [provider]  metoprolol succinate (TOPROL-XL) 100 MG 24 hr tablet TAKE ONE (1) TABLET EACH DAY  10/25/15   [provider]  Misc. Devices MISC Underpad's 6 Bags/15-90 per month  Incontinence Pads 3 Bags/20-60 per month  2 Box of Gloves per month  40 box of gauze  20 rolls of tape  Nutrition supplement/30 day supply 07/03/15   [provider]  mupirocin ointment (BACTROBAN) 2 % SMARTSIG:1 Application Topical 2-3 Times Daily 11/07/19   [provider]  pantoprazole (PROTONIX) 40 MG tablet TAKE 1 TABLET (40 MG TOTAL) BY MOUTH DAILY. Patient taking differently: Take 40 mg by mouth as needed. 02/06/15   Napoleon Form, MD  polyethylene glycol (MIRALAX / GLYCOLAX) packet Take 17 g by mouth daily as needed for mild constipation. 05/30/14   Alison Murray, MD  Secukinumab, 300 MG Dose, (COSENTYX, 300 MG DOSE,) 150 MG/ML SOSY Inject 300 mg into the skin every 30 (thirty) days.    [provider]  sodium chloride irrigation 0.9 % irrigation USE AS DIRECTED 08/06/17   [provider]  sulfamethoxazole-trimethoprim (BACTRIM DS) 800-160 MG tablet Take 1 tablet by mouth 2 (two) times daily. 11/08/19   [provider]  verapamil (CALAN-SR) 120 MG CR tablet Take 120 mg by mouth daily. 12/30/14   [provider]    No current facility-administered medications for this encounter.   Current Outpatient Medications  Medication Sig Dispense Refill   acetaminophen (TYLENOL) 500 MG tablet Take 1,000 mg by mouth every 6 (six) hours as needed for mild pain or headache.     albuterol (PROVENTIL HFA;VENTOLIN HFA) 108 (90 BASE) MCG/ACT inhaler Inhale 2 puffs into the lungs every 4 (four) hours as needed for shortness of breath.     anastrozole (ARIMIDEX) 1 MG tablet TAKE 1 TABLET BY MOUTH EVERY DAY 90 tablet 3   atorvastatin (LIPITOR) 20 MG tablet Take 20 mg by mouth every morning.     Blood Glucose Monitoring Suppl (BLOOD  GLUCOSE MONITOR SYSTEM) w/Device KIT With lancets and strips #100 6rf Dx: E11.9 Test bid     Calcium 150 MG TABS Take 1 tablet by  mouth daily.      cholecalciferol (VITAMIN D) 1000 units tablet Take 2,000 Units by mouth daily.     ferrous sulfate 325 (65 FE) MG tablet Take 325 mg by mouth 3 (three) times daily with meals.     fluconazole (DIFLUCAN) 150 MG tablet Take 150 mg by mouth as needed.      furosemide (LASIX) 20 MG tablet Take 20 mg by mouth once a week.     glipiZIDE (GLUCOTROL XL) 10 MG 24 hr tablet Take 10 mg by mouth daily with breakfast.      HYDROcodone-acetaminophen (NORCO/VICODIN) 5-325 MG tablet Take 1 tablet by mouth 2 (two) times daily as needed.     losartan (COZAAR) 100 MG tablet Take 100 mg by mouth daily.     metFORMIN (GLUCOPHAGE-XR) 500 MG 24 hr tablet TAKE ONE TABLET BY MOUTH TWICE DAILY     metoprolol succinate (TOPROL-XL) 100 MG 24 hr tablet TAKE ONE (1) TABLET EACH DAY     Misc. Devices MISC Underpad's 6 Bags/15-90 per month  Incontinence Pads 3 Bags/20-60 per month  2 Box of Gloves per month  40 box of gauze  20 rolls of tape  Nutrition supplement/30 day supply     mupirocin ointment (BACTROBAN) 2 % SMARTSIG:1 Application Topical 2-3 Times Daily     pantoprazole (PROTONIX) 40 MG tablet TAKE 1 TABLET (40 MG TOTAL) BY MOUTH DAILY. (Patient taking differently: Take 40 mg by mouth as needed.) 90 tablet 0   polyethylene glycol (MIRALAX / GLYCOLAX) packet Take 17 g by mouth daily as needed for mild constipation. 14 each 0   Secukinumab, 300 MG Dose, (COSENTYX, 300 MG DOSE,) 150 MG/ML SOSY Inject 300 mg into the skin every 30 (thirty) days.     sodium chloride irrigation 0.9 % irrigation USE AS DIRECTED     sulfamethoxazole-trimethoprim (BACTRIM DS) 800-160 MG tablet Take 1 tablet by mouth 2 (two) times daily.     verapamil (CALAN-SR) 120 MG CR tablet Take 120 mg by mouth daily.      Allergies as of 05/21/2023 - Review Complete 05/21/2023  Allergen Reaction Noted   Lisinopril Swelling and Cough 01/11/2015   Penicillins Nausea And Vomiting and Rash 01/17/2011     Review of Systems:     Constitutional: No weight loss, fever or chills Skin: No rash  Cardiovascular: No chest pain  Respiratory: No SOB  Gastrointestinal: See HPI and otherwise negative Genitourinary: No dysuria  Neurological: No headache, dizziness or syncope Musculoskeletal: No new muscle or joint pain Hematologic: No bruising Psychiatric: No history of depression or anxiety    Physical Exam:  Vital signs in last 24 hours: Temp:  [98.1 F (36.7 C)] 98.1 F (36.7 C) (03/20 1128) Pulse Rate:  [69] 69 (03/20 1126) Resp:  [18] 18 (03/20 1126) BP: (129)/(58) 129/58 (03/20 1126) SpO2:  [100 %] 100 % (03/20 1126) Weight:  [89.1 kg] 89.1 kg (03/20 1126)   General:   Pleasant overweight AA female appears to be in NAD, Well developed, Well nourished, alert and cooperative Head:  Normocephalic and atraumatic. Eyes:   PEERL, EOMI. No icterus. Conjunctiva pink. Ears:  Normal auditory acuity. Neck:  Supple Throat: Oral cavity and pharynx without inflammation, swelling or lesion.  Lungs: Respirations even and unlabored. Lungs clear to auscultation bilaterally.   No wheezes, crackles, or  rhonchi.  Heart: Normal S1, S2. No MRG. Regular rate and rhythm. No peripheral edema, cyanosis or pallor.  Abdomen:  Soft, nondistended, nontender. No rebound or guarding. Normal bowel sounds. No appreciable masses or hepatomegaly. Rectal: Clean bandages on both side of buttocks, tiny amount of bright red blood on patient's diaper; external exam with no fissure, no tenderness; internal exam with no mass, no residue Msk:  Symmetrical without gross deformities. Peripheral pulses intact.  Extremities:  Without edema, no deformity or joint abnormality.  Neurologic:  Alert and  oriented x4;  grossly normal neurologically.  Skin:   Dry and intact without significant lesions or rashes. Psychiatric: Demonstrates good judgement and reason without abnormal affect or behaviors.   LAB RESULTS:    Latest Ref Rng & Units 05/21/2023   12:00  PM 05/07/2023    1:05 PM 04/03/2023    9:16 AM  CBC  WBC 4.0 - 10.5 K/uL 11.7  8.2  6.8   Hemoglobin 12.0 - 15.0 g/dL 8.6  8.8  9.0   Hematocrit 36.0 - 46.0 % 29.2  29.7  30.6   Platelets 150 - 400 K/uL 371  335  320       Impression / Plan:   Impression: 1.  Hematochezia: 2 episodes of quite a bit of bright red blood in the toilet per patient this morning, maroon blood on exam for the ED physician, nothing for me at time of my interview, hemoglobin remained stable over the past 6 to 9 months, patient is on chronic iron supplementation for anemia followed by oncology, small amount of abdominal cramping prior to bowel movements this morning but nothing since, no overt cause seen on rectal exam, history of diverticular bleed 10 years ago; likely diverticular 2.  Anemia: Chronic for the patient thought related to her hidradenitis and CKD, follows with oncology and remains on 2 iron tabs daily 3.  History of breast cancer status posttreatment in 2016/17  Plan: 1.  Would recommend observing the patient at least over the next 24 hours for any further bleeding.  If patient has signs of brisk GI bleed would recommend a CTA 2.  Further labs pending including a CMP 3.  Patient can be on clear liquid diet today 4.  Patient would benefit from 1 more colonoscopy, her last was 10 years ago, this can be pursued outpatient pending how things progress here 5.  Continue to monitor hemoglobin and transfusion as needed less than 7  Thank you for your kind consultation, we will continue to follow.  Violet Baldy Lemmon  05/21/2023, 3:00 PM  Gastroenterology attending physician:  I have seen and evaluated the patient as well.  I agree with the advanced practice provider note.  I performed regarding the medical decision making.  The patient has hematochezia it seems low-volume and her hemoglobin is near baseline.  Diverticular bleeding could be hemorrhoidal though suspect diverticular like she has had in the  past.  If she does not have further bleeding over the next 24 hours, I think it would be reasonable to discharge her for outpatient colonoscopy.  We will see her tomorrow and decide.  As above if there is brisk bleeding CT angio of the abdomen and pelvis.  Iva Boop, MD, [facility] See Loretha Stapler on call - gastroenterology for best contact person 05/21/2023 5:01 PM

## 2023-05-21 NOTE — ED Triage Notes (Signed)
 Patient is here for evaluation bleeding from rectum. Reports she went to go poop she started pooping bright red blood this morning. States not much solid stool, mainly just blood. Denies any nausea or vomiting. States this has happened before.

## 2023-05-22 DIAGNOSIS — K922 Gastrointestinal hemorrhage, unspecified: Secondary | ICD-10-CM

## 2023-05-22 DIAGNOSIS — E785 Hyperlipidemia, unspecified: Secondary | ICD-10-CM | POA: Diagnosis not present

## 2023-05-22 DIAGNOSIS — D649 Anemia, unspecified: Secondary | ICD-10-CM | POA: Diagnosis not present

## 2023-05-22 DIAGNOSIS — K921 Melena: Secondary | ICD-10-CM | POA: Diagnosis not present

## 2023-05-22 DIAGNOSIS — E1169 Type 2 diabetes mellitus with other specified complication: Secondary | ICD-10-CM | POA: Diagnosis not present

## 2023-05-22 LAB — CBC WITH DIFFERENTIAL/PLATELET
Abs Immature Granulocytes: 0.03 10*3/uL (ref 0.00–0.07)
Basophils Absolute: 0 10*3/uL (ref 0.0–0.1)
Basophils Relative: 0 %
Eosinophils Absolute: 0.2 10*3/uL (ref 0.0–0.5)
Eosinophils Relative: 2 %
HCT: 25.7 % — ABNORMAL LOW (ref 36.0–46.0)
Hemoglobin: 7.7 g/dL — ABNORMAL LOW (ref 12.0–15.0)
Immature Granulocytes: 0 %
Lymphocytes Relative: 18 %
Lymphs Abs: 1.6 10*3/uL (ref 0.7–4.0)
MCH: 25.3 pg — ABNORMAL LOW (ref 26.0–34.0)
MCHC: 30 g/dL (ref 30.0–36.0)
MCV: 84.5 fL (ref 80.0–100.0)
Monocytes Absolute: 0.5 10*3/uL (ref 0.1–1.0)
Monocytes Relative: 6 %
Neutro Abs: 6.7 10*3/uL (ref 1.7–7.7)
Neutrophils Relative %: 74 %
Platelets: 310 10*3/uL (ref 150–400)
RBC: 3.04 MIL/uL — ABNORMAL LOW (ref 3.87–5.11)
RDW: 15.8 % — ABNORMAL HIGH (ref 11.5–15.5)
WBC: 8.9 10*3/uL (ref 4.0–10.5)
nRBC: 0 % (ref 0.0–0.2)

## 2023-05-22 LAB — COMPREHENSIVE METABOLIC PANEL
ALT: 9 U/L (ref 0–44)
AST: 9 U/L — ABNORMAL LOW (ref 15–41)
Albumin: 3 g/dL — ABNORMAL LOW (ref 3.5–5.0)
Alkaline Phosphatase: 71 U/L (ref 38–126)
Anion gap: 6 (ref 5–15)
BUN: 17 mg/dL (ref 8–23)
CO2: 25 mmol/L (ref 22–32)
Calcium: 8.6 mg/dL — ABNORMAL LOW (ref 8.9–10.3)
Chloride: 106 mmol/L (ref 98–111)
Creatinine, Ser: 0.81 mg/dL (ref 0.44–1.00)
GFR, Estimated: >60 mL/min (ref >60)
Glucose, Bld: 99 mg/dL (ref 70–99)
Potassium: 4 mmol/L (ref 3.5–5.1)
Sodium: 137 mmol/L (ref 135–145)
Total Bilirubin: 0.4 mg/dL (ref 0.0–1.2)
Total Protein: 6.7 g/dL (ref 6.5–8.1)

## 2023-05-22 LAB — GLUCOSE, CAPILLARY
Glucose-Capillary: 102 mg/dL — ABNORMAL HIGH (ref 70–99)
Glucose-Capillary: 174 mg/dL — ABNORMAL HIGH (ref 70–99)
Glucose-Capillary: 79 mg/dL (ref 70–99)
Glucose-Capillary: 137 mg/dL — ABNORMAL HIGH (ref 70–99)

## 2023-05-22 LAB — HEMOGLOBIN
Hemoglobin: 7.3 g/dL — ABNORMAL LOW (ref 12.0–15.0)
Hemoglobin: 7.8 g/dL — ABNORMAL LOW (ref 12.0–15.0)

## 2023-05-22 MED ORDER — POLYETHYLENE GLYCOL 3350 17 GM/SCOOP PO POWD
119.0000 g | Freq: Once | ORAL | Status: AC
  Administered 2023-05-22: 119 g via ORAL
  Filled 2023-05-22: qty 119

## 2023-05-22 MED ORDER — SODIUM CHLORIDE 0.9 % IV SOLN
INTRAVENOUS | Status: AC
  Administered 2023-05-22: 10 mL via INTRAVENOUS

## 2023-05-22 NOTE — TOC Initial Note (Signed)
 Transition of Care [facility]) - Initial/Assessment Note    Patient Details  Name: Kristy Harris MRN: [id number] Date of Birth: 11-29-1945  [facility] CM/SW Contact:    Lanier Clam, RN Phone Number: 05/22/2023, 1:30 PM  Clinical Narrative:  Spoke to patient about d/c plans-d/c plan home. WOC following. Patient may need HHRN-Asked [facility] reo [facility] to check if able to provide HHRN-await outcome. Has own transport home.                Expected Discharge Plan: Home w Home Health Services Barriers to Discharge: Continued Medical Work up   Patient Goals and CMS Choice Patient states their goals for this hospitalization and ongoing recovery are:: Home CMS Medicare.gov Compare Post Acute Care list provided to:: Patient Choice offered to / list presented to : Patient [place] ownership interest in [facility].provided to:: Patient    Expected Discharge Plan and Services   Discharge Planning Services: CM Consult   Living arrangements for the past 2 months: Single Family Home                                      Prior Living Arrangements/Services Living arrangements for the past 2 months: Single Family Home Lives with:: Adult Children   Do you feel safe going back to the place where you live?: Yes          Current home services: DME (rw,3n1,cane)    Activities of Daily Living   ADL Screening (condition at time of admission) Independently performs ADLs?: Yes (appropriate for developmental age) Is the patient deaf or have difficulty hearing?: No Does the patient have difficulty seeing, even when wearing glasses/contacts?: No Does the patient have difficulty concentrating, remembering, or making decisions?: No  Permission Sought/Granted Permission sought to share information with : Case Manager Permission granted to share information with : Yes, Verbal Permission Granted  Share Information with NAME: Case manager           Emotional  Assessment              Admission diagnosis:  Hematochezia [K92.1] Lower GI bleed [K92.2] Patient Active Problem List   Diagnosis Date Noted   Hematochezia 05/21/2023   Aortic atherosclerosis (HCC) 01/10/2021   Pressure injury of skin 11/03/2018   Swelling of left hand 11/02/2018   Osteopenia 08/14/2017   Genetic testing 01/23/2015   Family history of breast cancer 01/09/2015   Encounter for pre-operative cardiovascular clearance 11/23/2014   Aortic stenosis 11/23/2014   Normal coronary arteries 11/23/2014   Morbid obesity (HCC) 11/23/2014   DJD (degenerative joint disease) 11/23/2014   Breast cancer of upper-outer quadrant of left female breast (HCC) 11/13/2014   Diverticulosis of colon with hemorrhage    H. pylori infection 05/28/2014   Abscess of skin and subcutaneous tissue 12/08/2013   Iron deficiency anemia 09/19/2013   Abscess of left axilla 08/11/2013   Abscess of left thigh 05/26/2013   Abscess of buttock, right 06/13/2011   Arteritis (HCC) 02/27/2011   Abnormal cardiovascular function study 02/15/2011   Peripheral vascular disease (HCC) 02/15/2011   Diabetes mellitus (HCC) 02/15/2011   Dyspnea 01/24/2011   Hypertension 01/24/2011   Hyperlipidemia 01/24/2011   Obesity 01/24/2011   Bruit 01/24/2011   Murmur 01/24/2011   PCP:  Rebecka Apley, NP Pharmacy:   [facility] [phone] - [place], [place] - [address] [place] [address] Phone: [phone] Fax: [phone]  [facility] [place], [place] - [address] [place] [address] Phone: [phone] Fax: [phone]     Social Drivers of Health (SDOH) Social History: SDOH Screenings   Food Insecurity: No Food Insecurity (05/21/2023)  Housing: Unknown (05/21/2023)  Transportation Needs: No Transportation Needs (05/21/2023)  Utilities: Not At Risk (05/21/2023)  Financial Resource Strain: High Risk (02/04/2023)   Received from [facility]  Physical Activity: Unknown (02/04/2023)   Received from [facility]  Social Connections: Socially Integrated (02/04/2023)   Received from [facility]  Stress: No Stress Concern Present (02/04/2023)   Received from [facility]  Tobacco Use: Medium Risk (05/21/2023)   SDOH Interventions:     Readmission Risk Interventions     No data to display

## 2023-05-22 NOTE — Consult Note (Signed)
 WOC team consulted for buttocks mons wounds.    Please note that the [facility] nursing team is utilizing a standardized work plan to manage patient consults. We are triaging consults and will try to see the patients within 48 hours. Wound photos in the patient's chart allow [place] to consult on the patient in the most efficient and timely manner.    Thank you,    Priscella Mann MSN, RN-BC, [employer] [phone]

## 2023-05-22 NOTE — Care Management Obs Status (Signed)
 MEDICARE OBSERVATION STATUS NOTIFICATION   Patient Details  Name: Kristy Harris MRN: [id number] Date of Birth: Jun 19, 1945   Medicare Observation Status Notification Given:  Yes    MahabirOlegario Messier, RN 05/22/2023, 11:29 AM

## 2023-05-22 NOTE — Consult Note (Addendum)
 WOC Nurse Consult Note: patient with history of hidradenitis suppurative which is not a wound but a condition; no wound care will heal hidradenitis suppurativa Reason for Consult: buttocks and pubic wounds  Wound type: full thickness r/t hidradenitis suppurativa  Pressure Injury POA: NA  Measurement: widespread to buttocks and pubic area Wound bed:red moist areas  Drainage (amount,  consistency, odor) HS drains often foul smelling purulent or milky type drainage  Periwound: chronic discoloration  Dressing procedure/placement/frequency: Cleanse all wounds with Vashe wound cleanser([employer] #[id number]), do not rinse and allow to air dry. Apply silver hydrofiber (Aquacel [place] #[id number]) to all wound beds daily and as needed for saturation with drainage.  Cover with ABD pads and tape or silicone foam whichever is preferred.   It appears patient has been followed for Hidradenitis Supurativa at [facility]  Patient would benefit from ongoing management of this condition as outpatient.    POC discussed with bedside nurse. WOC team will not follow. Re-consult if further needs arise.   Thank you,    Priscella Mann MSN, RN-BC, [employer] [phone]

## 2023-05-22 NOTE — Plan of Care (Signed)

## 2023-05-22 NOTE — Progress Notes (Signed)
 [facility]                                                                               Kristy Harris, is a 78 y.o. female, DOB - 28-Nov-1945, SWF:[id number] Admit date - 05/21/2023    Outpatient Primary MD for the patient is Hemberg, Kristy Cola, NP  LOS - 0  days    Brief summary    Kristy Harris is a 78 y.o. female with medical history significant of chronic iron deficiency anemia, history of breast cancer, COPD, hypertension, hyperlipidemia, type 2 diabetes on oral hypoglycemics, history of diverticulosis presented to the emergency room with 2 episodes of bright red blood, per rectum.  She was admitted for evaluation of rectal bleed, suspicious for a diverticular bleed. Improving.  Hemoglobin around 7 and stable. GI on board.    Assessment & Plan    Assessment and Plan:   Hematochezia:  Suspect from known diverticulosis.  Clears as tolerated.  GI on board and appreciate recommendations.  Hemoglobin stable around 7.  Plan for colonoscopy in am.    COPD; stable.  No wheezing heard.    Iron deficiency anemia:  Low iron levels .  Will plan for iron supplementation on discharge.    Hypertension;  Well controlled.    Type 2 DM;  CBG (last 3)  Recent Labs    05/21/23 2118 05/22/23 0842 05/22/23 1213  GLUCAP 102* 102* 174*   Resume SSI.         RN Pressure Injury Documentation: Pressure Injury 11/02/18 Buttocks Right;Left Stage II -  Partial thickness loss of dermis presenting as a shallow open ulcer with a red, pink wound bed without slough. (Active)  11/02/18 2215  Location: Buttocks  Location Orientation: Right;Left  Staging: Stage II -  Partial thickness loss of dermis presenting as a shallow open ulcer with a red, pink wound bed without slough.  Wound Description (Comments):   Present on Admission: Yes   Wound care consulted    Estimated body mass index is 34.8 kg/m as calculated from the following:   Height as of this  encounter: 5\' 3"  (1.6 m).   Weight as of this encounter: 89.1 kg.  Code Status: full code.  DVT Prophylaxis:  SCDs Start: 05/21/23 1929   Level of Care: Level of care: Med-Surg Family Communication: none at bedside.   Disposition Plan:     Remains inpatient appropriate:  eval for hematochezia.   Procedures:  None.   Consultants:   Gastroenterology Wound care.   Antimicrobials:   Anti-infectives (From admission, onward)    None        Medications  Scheduled Meds:  anastrozole  1 mg Oral Daily   atorvastatin  20 mg Oral q morning   cholecalciferol  2,000 Units Oral Daily   ferrous sulfate  325 mg Oral TID WC   insulin aspart  0-9 Units Subcutaneous TID WC   losartan  100 mg Oral Daily   metFORMIN  500 mg Oral BID WC   metoprolol succinate  100 mg Oral Daily   pantoprazole  40 mg Oral Daily   polyethylene glycol powder  119 g Oral Once  Followed by   polyethylene glycol powder  119 g Oral Once   Continuous Infusions:  sodium chloride     PRN Meds:.acetaminophen, albuterol, sorbitol    Subjective:   Kristy Harris was seen and examined today.  One episode of dark maroon stool earlier today.   Objective:   Vitals:   05/22/23 0202 05/22/23 0505 05/22/23 1034 05/22/23 1310  BP: (!) 103/52 (!) 121/47  (!) 113/42  Pulse: 66 76 91 79  Resp: 20 18 18    Temp: 98 F (36.7 C) 97.7 F (36.5 C) 98.4 F (36.9 C) 98.5 F (36.9 C)  TempSrc:   Oral   SpO2: 97% 97% 99% 99%  Weight:      Height:        Intake/Output Summary (Last 24 hours) at 05/22/2023 1549 Last data filed at 05/22/2023 1110 Gross per 24 hour  Intake --  Output 500 ml  Net -500 ml   Filed Weights   05/21/23 1126  Weight: 89.1 kg     Exam General exam: Appears calm and comfortable  Respiratory system: Clear to auscultation. Respiratory effort normal. Cardiovascular system: S1 & S2 heard, RRR. No JVD,  Gastrointestinal system: Abdomen is nondistended, soft and nontender.  Central  nervous system: Alert and oriented.  Extremities: Symmetric 5 x 5 power. Skin: hydradenitis suppurativa on the buttocks.  Psychiatry:  Mood & affect appropriate.    Data Reviewed:  I have personally reviewed following labs and imaging studies   CBC Lab Results  Component Value Date   WBC 8.9 05/22/2023   RBC 3.04 (L) 05/22/2023   HGB 7.7 (L) 05/22/2023   HCT 25.7 (L) 05/22/2023   MCV 84.5 05/22/2023   MCH 25.3 (L) 05/22/2023   PLT 310 05/22/2023   MCHC 30.0 05/22/2023   RDW 15.8 (H) 05/22/2023   LYMPHSABS 1.6 05/22/2023   MONOABS 0.5 05/22/2023   EOSABS 0.2 05/22/2023   BASOSABS 0.0 05/22/2023     Last metabolic panel Lab Results  Component Value Date   NA 137 05/22/2023   K 4.0 05/22/2023   CL 106 05/22/2023   CO2 25 05/22/2023   BUN 17 05/22/2023   CREATININE 0.81 05/22/2023   GLUCOSE 99 05/22/2023   GFRNONAA >60 05/22/2023   GFRAA >60 09/26/2019   CALCIUM 8.6 (L) 05/22/2023   PROT 6.7 05/22/2023   ALBUMIN 3.0 (L) 05/22/2023   LABGLOB 4.3 (H) 07/14/2022   AGRATIO 0.6 (L) 07/14/2022   BILITOT 0.4 05/22/2023   ALKPHOS 71 05/22/2023   AST 9 (L) 05/22/2023   ALT 9 05/22/2023   ANIONGAP 6 05/22/2023    CBG (last 3)  Recent Labs    05/21/23 2118 05/22/23 0842 05/22/23 1213  GLUCAP 102* 102* 174*      Coagulation Profile: No results for input(s): "INR", "PROTIME" in the last 168 hours.   Radiology Studies: No results found.     Kathlen Mody M.D. [facility] 05/22/2023, 3:49 PM  Available via Epic secure chat 7am-7pm After 7 pm, please refer to night coverage provider listed on amion.

## 2023-05-22 NOTE — H&P (View-Only) (Signed)
 Progress Note   Subjective  Hospital day #2 Chief Complaint: Hematochezia  Patient reports that she has had no further bowel movements overnight.  No abdominal pain, no real complaints.  Feels well.  Hemoglobin 8.8--> 7.3 overnight   Objective   Vital signs in last 24 hours: Temp:  [97.7 F (36.5 C)-99 F (37.2 C)] 98.4 F (36.9 C) (03/21 1034) Pulse Rate:  [66-91] 91 (03/21 1034) Resp:  [16-20] 18 (03/21 1034) BP: (103-125)/(46-52) 121/47 (03/21 0505) SpO2:  [97 %-99 %] 99 % (03/21 1034) Last BM Date : 05/21/23 General:   AA female in NAD Heart:  Regular rate and rhythm; no murmurs Lungs: Respirations even and unlabored, lungs CTA bilaterally Abdomen:  Soft, nontender and nondistended.  Increased bowel sounds all 4 quadrants Psych:  Cooperative. Normal mood and affect.  Lab Results: Recent Labs    05/21/23 1200 05/21/23 1634 05/21/23 1640 05/22/23 0403  WBC 11.7*  --   --   --   HGB 8.6* 7.8* 8.8* 7.3*  HCT 29.2*  --  26.0*  --   PLT 371  --   --   --    BMET Recent Labs    05/21/23 1200 05/21/23 1640 05/22/23 0403  NA 135 135 137  K 5.2* 4.5 4.0  CL 102 103 106  CO2 21*  --  25  GLUCOSE 109* 126* 99  BUN 20 19 17   CREATININE 0.82 0.90 0.81  CALCIUM 8.9  --  8.6*   LFT Recent Labs    05/22/23 0403  PROT 6.7  ALBUMIN 3.0*  AST 9*  ALT 9  ALKPHOS 71  BILITOT 0.4    Assessment / Plan:   Assessment: 1.  Hematochezia: 2 episodes of quite a bit of bright red blood in the toilet on the morning of 3/20, none since, hemoglobin did drop slightly overnight 8.8--> 7.3, patient feeling well; likely diverticular bleed 2.  Anemia: Chronic for the patient thought related to her hidradenitis and CKD, follows with oncology and remains on daily iron supplementation 3.  History of breast cancer status posttreatment in 2016/2017  Plan: 1.  Will recheck the hemoglobin again at noon, if it remains stable I think she could be discharged today. 2.  Diet had been  increased to full liquid by the hospitalist team, if she does well with this for lunch then could return to low fiber/normal diet later today 3.  Patient may be able to be discharged today pending what happens in the later afternoon 4.  Patient will need to follow-up with our clinic at discharge in the next 4 to 6 weeks so that we can make sure she has a colonoscopy for screening purposes  Thank you for your kind consultation, we will continue to follow.   LOS: 0 days   Unk Lightning  05/22/2023, 11:36 AM     [facility] Attending   I have taken an interval history, reviewed the chart and examined the patient. I agree with the Advanced Practitioner's note, impression and recommendations with the following additions:     Latest Ref Rng & Units 05/22/2023   11:24 AM 05/22/2023   11:23 AM 05/22/2023    4:03 AM  CBC  WBC 4.0 - 10.5 K/uL 8.9     Hemoglobin 12.0 - 15.0 g/dL 7.7  7.8  7.3   Hematocrit 36.0 - 46.0 % 25.7     Platelets 150 - 400 K/uL 310      She had another  small volume episode of hematochezia - will do colonoscopy tomorrow. The risks and benefits as well as alternatives of endoscopic procedure(s) have been discussed and reviewed. All questions answered. The patient agrees to proceed.

## 2023-05-22 NOTE — Progress Notes (Addendum)
 Progress Note   Subjective  Hospital day #2 Chief Complaint: Hematochezia  Patient reports that she has had no further bowel movements overnight.  No abdominal pain, no real complaints.  Feels well.  Hemoglobin 8.8--> 7.3 overnight   Objective   Vital signs in last 24 hours: Temp:  [97.7 F (36.5 C)-99 F (37.2 C)] 98.4 F (36.9 C) (03/21 1034) Pulse Rate:  [66-91] 91 (03/21 1034) Resp:  [16-20] 18 (03/21 1034) BP: (103-125)/(46-52) 121/47 (03/21 0505) SpO2:  [97 %-99 %] 99 % (03/21 1034) Last BM Date : 05/21/23 General:   AA female in NAD Heart:  Regular rate and rhythm; no murmurs Lungs: Respirations even and unlabored, lungs CTA bilaterally Abdomen:  Soft, nontender and nondistended.  Increased bowel sounds all 4 quadrants Psych:  Cooperative. Normal mood and affect.  Lab Results: Recent Labs    05/21/23 1200 05/21/23 1634 05/21/23 1640 05/22/23 0403  WBC 11.7*  --   --   --   HGB 8.6* 7.8* 8.8* 7.3*  HCT 29.2*  --  26.0*  --   PLT 371  --   --   --    BMET Recent Labs    05/21/23 1200 05/21/23 1640 05/22/23 0403  NA 135 135 137  K 5.2* 4.5 4.0  CL 102 103 106  CO2 21*  --  25  GLUCOSE 109* 126* 99  BUN 20 19 17   CREATININE 0.82 0.90 0.81  CALCIUM 8.9  --  8.6*   LFT Recent Labs    05/22/23 0403  PROT 6.7  ALBUMIN 3.0*  AST 9*  ALT 9  ALKPHOS 71  BILITOT 0.4    Assessment / Plan:   Assessment: 1.  Hematochezia: 2 episodes of quite a bit of bright red blood in the toilet on the morning of 3/20, none since, hemoglobin did drop slightly overnight 8.8--> 7.3, patient feeling well; likely diverticular bleed 2.  Anemia: Chronic for the patient thought related to her hidradenitis and CKD, follows with oncology and remains on daily iron supplementation 3.  History of breast cancer status posttreatment in 2016/2017  Plan: 1.  Will recheck the hemoglobin again at noon, if it remains stable I think she could be discharged today. 2.  Diet had been  increased to full liquid by the hospitalist team, if she does well with this for lunch then could return to low fiber/normal diet later today 3.  Patient may be able to be discharged today pending what happens in the later afternoon 4.  Patient will need to follow-up with our clinic at discharge in the next 4 to 6 weeks so that we can make sure she has a colonoscopy for screening purposes  Thank you for your kind consultation, we will continue to follow.   LOS: 0 days   Unk Lightning  05/22/2023, 11:36 AM     [facility] Attending   I have taken an interval history, reviewed the chart and examined the patient. I agree with the Advanced Practitioner's note, impression and recommendations with the following additions:     Latest Ref Rng & Units 05/22/2023   11:24 AM 05/22/2023   11:23 AM 05/22/2023    4:03 AM  CBC  WBC 4.0 - 10.5 K/uL 8.9     Hemoglobin 12.0 - 15.0 g/dL 7.7  7.8  7.3   Hematocrit 36.0 - 46.0 % 25.7     Platelets 150 - 400 K/uL 310      She had another  small volume episode of hematochezia - will do colonoscopy tomorrow. The risks and benefits as well as alternatives of endoscopic procedure(s) have been discussed and reviewed. All questions answered. The patient agrees to proceed.

## 2023-05-23 ENCOUNTER — Observation Stay (HOSPITAL_COMMUNITY): Admitting: Anesthesiology

## 2023-05-23 ENCOUNTER — Encounter (HOSPITAL_COMMUNITY)
Admission: EM | Disposition: A | Discharge status: Home / Self Care | Payer: Self-pay | Source: Home / Self Care | Attending: Internal Medicine

## 2023-05-23 ENCOUNTER — Encounter (HOSPITAL_COMMUNITY): Payer: Self-pay | Admitting: Internal Medicine

## 2023-05-23 DIAGNOSIS — K922 Gastrointestinal hemorrhage, unspecified: Secondary | ICD-10-CM | POA: Diagnosis not present

## 2023-05-23 DIAGNOSIS — K5731 Diverticulosis of large intestine without perforation or abscess with bleeding: Secondary | ICD-10-CM | POA: Diagnosis not present

## 2023-05-23 DIAGNOSIS — L732 Hidradenitis suppurativa: Secondary | ICD-10-CM | POA: Diagnosis not present

## 2023-05-23 DIAGNOSIS — E785 Hyperlipidemia, unspecified: Secondary | ICD-10-CM | POA: Diagnosis not present

## 2023-05-23 DIAGNOSIS — K921 Melena: Secondary | ICD-10-CM | POA: Diagnosis not present

## 2023-05-23 DIAGNOSIS — K648 Other hemorrhoids: Secondary | ICD-10-CM

## 2023-05-23 DIAGNOSIS — K625 Hemorrhage of anus and rectum: Secondary | ICD-10-CM

## 2023-05-23 DIAGNOSIS — K5730 Diverticulosis of large intestine without perforation or abscess without bleeding: Secondary | ICD-10-CM | POA: Diagnosis not present

## 2023-05-23 DIAGNOSIS — E1169 Type 2 diabetes mellitus with other specified complication: Secondary | ICD-10-CM | POA: Diagnosis not present

## 2023-05-23 HISTORY — PX: HEMOSTASIS CLIP PLACEMENT: SHX6857

## 2023-05-23 HISTORY — PX: COLONOSCOPY: SHX5424

## 2023-05-23 LAB — CBC WITH DIFFERENTIAL/PLATELET
Abs Immature Granulocytes: 0.07 10*3/uL (ref 0.00–0.07)
Basophils Absolute: 0 10*3/uL (ref 0.0–0.1)
Basophils Relative: 0 %
Eosinophils Absolute: 0.2 10*3/uL (ref 0.0–0.5)
Eosinophils Relative: 2 %
HCT: 25.9 % — ABNORMAL LOW (ref 36.0–46.0)
Hemoglobin: 7.7 g/dL — ABNORMAL LOW (ref 12.0–15.0)
Immature Granulocytes: 1 %
Lymphocytes Relative: 12 %
Lymphs Abs: 1.2 10*3/uL (ref 0.7–4.0)
MCH: 25.4 pg — ABNORMAL LOW (ref 26.0–34.0)
MCHC: 29.7 g/dL — ABNORMAL LOW (ref 30.0–36.0)
MCV: 85.5 fL (ref 80.0–100.0)
Monocytes Absolute: 0.6 10*3/uL (ref 0.1–1.0)
Monocytes Relative: 6 %
Neutro Abs: 7.4 10*3/uL (ref 1.7–7.7)
Neutrophils Relative %: 79 %
Platelets: 315 10*3/uL (ref 150–400)
RBC: 3.03 MIL/uL — ABNORMAL LOW (ref 3.87–5.11)
RDW: 15.6 % — ABNORMAL HIGH (ref 11.5–15.5)
WBC: 9.5 10*3/uL (ref 4.0–10.5)
nRBC: 0 % (ref 0.0–0.2)

## 2023-05-23 LAB — PREPARE RBC (CROSSMATCH)

## 2023-05-23 LAB — GLUCOSE, CAPILLARY
Glucose-Capillary: 96 mg/dL (ref 70–99)
Glucose-Capillary: 105 mg/dL — ABNORMAL HIGH (ref 70–99)
Glucose-Capillary: 93 mg/dL (ref 70–99)
Glucose-Capillary: 115 mg/dL — ABNORMAL HIGH (ref 70–99)

## 2023-05-23 LAB — HEMOGLOBIN
Hemoglobin: 7.9 g/dL — ABNORMAL LOW (ref 12.0–15.0)
Hemoglobin: 7.8 g/dL — ABNORMAL LOW (ref 12.0–15.0)
Hemoglobin: 8.4 g/dL — ABNORMAL LOW (ref 12.0–15.0)

## 2023-05-23 SURGERY — COLONOSCOPY
Anesthesia: Monitor Anesthesia Care

## 2023-05-23 MED ORDER — PROPOFOL 10 MG/ML IV BOLUS
INTRAVENOUS | Status: DC | PRN
Start: 2023-05-23 — End: 2023-05-23
  Administered 2023-05-23: 30 mg via INTRAVENOUS

## 2023-05-23 MED ORDER — PROPOFOL 1000 MG/100ML IV EMUL
INTRAVENOUS | Status: AC
  Filled 2023-05-23: qty 200

## 2023-05-23 MED ORDER — PROPOFOL 500 MG/50ML IV EMUL
INTRAVENOUS | Status: AC
  Filled 2023-05-23: qty 100

## 2023-05-23 MED ORDER — SODIUM CHLORIDE 0.9 % IV SOLN: INTRAVENOUS | Status: DC | PRN

## 2023-05-23 MED ORDER — SODIUM CHLORIDE 0.9% IV SOLUTION: Freq: Once | INTRAVENOUS | Status: AC

## 2023-05-23 MED ORDER — PROPOFOL 500 MG/50ML IV EMUL
INTRAVENOUS | Status: DC | PRN
  Administered 2023-05-23 (×2): 125 ug/kg/min via INTRAVENOUS

## 2023-05-23 NOTE — Anesthesia Preprocedure Evaluation (Addendum)
 Anesthesia Evaluation  Patient identified by MRN, date of birth, ID band Patient awake    Reviewed: Allergy & Precautions, NPO status , Patient's Chart, lab work & pertinent test results, reviewed documented beta blocker date and time   Airway Mallampati: II  TM Distance: >3 FB Neck ROM: Full    Dental  (+) Missing, Chipped, Dental Advisory Given,    Pulmonary COPD,  COPD inhaler, former smoker   Pulmonary exam normal breath sounds clear to auscultation       Cardiovascular hypertension, Pt. on medications and Pt. on home beta blockers + Peripheral Vascular Disease  Normal cardiovascular exam+ Valvular Problems/Murmurs (mod AS) AS  Rhythm:Regular Rate:Normal  TTE 2024 1. Left ventricular ejection fraction, by estimation, is 70 to 75%. The  left ventricle has hyperdynamic function. The left ventricle has no  regional wall motion abnormalities. There is moderate concentric left  ventricular hypertrophy. Left ventricular  diastolic parameters are consistent with Grade II diastolic dysfunction  (pseudonormalization).   2. Right ventricular systolic function is normal. The right ventricular  size is normal. There is normal pulmonary artery systolic pressure. The  estimated right ventricular systolic pressure is 19.2 mmHg.   3. Left atrial size was moderately dilated.   4. Right atrial size was mildly dilated.   5. The mitral valve is degenerative. Trivial mitral valve regurgitation.   6. The aortic valve is tricuspid. There is moderate calcification of the  aortic valve. Aortic valve regurgitation is not visualized. Moderate  aortic valve stenosis. Aortic valve area, by VTI measures 1.23 cm. Aortic  valve mean gradient measures 32.0  mmHg. Dimentionless index 0.39.   7. The inferior vena cava is normal in size with greater than 50%  respiratory variability, suggesting right atrial pressure of 3 mmHg.     Neuro/Psych negative  neurological ROS  negative psych ROS   GI/Hepatic Neg liver ROS,GERD  ,,  Endo/Other  diabetes, Type 2, Oral Hypoglycemic Agents    Renal/GU negative Renal ROS  negative genitourinary   Musculoskeletal  (+) Arthritis ,    Abdominal   Peds  Hematology  (+) Blood dyscrasia, anemia Lab Results      Component                Value               Date                      WBC                      8.9                 05/22/2023                HGB                      7.9 (L)             05/23/2023                HCT                      25.7 (L)            05/22/2023                MCV  84.5                05/22/2023                PLT                      310                 05/22/2023              Anesthesia Other Findings 78 y.o. female with medical history significant of chronic iron deficiency anemia, history of breast cancer, COPD, hypertension, hyperlipidemia, type 2 diabetes on oral hypoglycemics, history of diverticulosis presented to the emergency room with 2 episodes of bright red blood, per rectum.   Reproductive/Obstetrics                             Anesthesia Physical Anesthesia Plan  ASA: 3  Anesthesia Plan: MAC   Post-op Pain Management:    Induction: Intravenous  PONV Risk Score and Plan: Propofol infusion and Treatment may vary due to age or medical condition  Airway Management Planned: Natural Airway  Additional Equipment:   Intra-op Plan:   Post-operative Plan:   Informed Consent: I have reviewed the patients History and Physical, chart, labs and discussed the procedure including the risks, benefits and alternatives for the proposed anesthesia with the patient or authorized representative who has indicated his/her understanding and acceptance.     Dental advisory given  Plan Discussed with: CRNA  Anesthesia Plan Comments:        Anesthesia Quick Evaluation

## 2023-05-23 NOTE — Transfer of Care (Signed)
 Immediate Anesthesia Transfer of Care Note  Patient: Kristy Harris  Procedure(s) Performed: COLONOSCOPY CONTROL OF HEMORRHAGE, GI TRACT, ENDOSCOPIC, BY CLIPPING OR OVERSEWING  Patient Location: PACU  Anesthesia Type:MAC  Level of Consciousness: drowsy and patient cooperative  Airway & Oxygen Therapy: Patient Spontanous Breathing and Patient connected to face mask oxygen  Post-op Assessment: Report given to RN and Post -op Vital signs reviewed and stable  Post vital signs: Reviewed and stable  Last Vitals:  Vitals Value Taken Time  BP 93/78 05/23/23 1034  Temp 37.1 C 05/23/23 1034  Pulse 96 05/23/23 1035  Resp 25 05/23/23 1035  SpO2 99 % 05/23/23 1035  Vitals shown include unfiled device data.  Last Pain:  Vitals:   05/23/23 1034  TempSrc: Temporal  PainSc:          Complications: No notable events documented.

## 2023-05-23 NOTE — Interval H&P Note (Signed)
 History and Physical Interval Note:  05/23/2023 9:43 AM  Kristy Harris  has presented today for surgery, with the diagnosis of Hematochezia.  The various methods of treatment have been discussed with the patient and family. After consideration of risks, benefits and other options for treatment, the patient has consented to  Procedure(s): COLONOSCOPY (N/A) as a surgical intervention.  The patient's history has been reviewed, patient examined, no change in status, stable for surgery.  I have reviewed the patient's chart and labs.  Questions were answered to the patient's satisfaction.     Stan Head

## 2023-05-23 NOTE — Op Note (Signed)
 [facility] Patient Name: Kristy Harris Procedure Date: 05/23/2023 MRN: [id number] Attending MD: Iva Boop , MD, [id number] Date of Birth: 12/22/1945 CSN: [id number] Age: 78 Admit Type: Inpatient Procedure:                Colonoscopy Indications:              Hematochezia Providers:                Iva Boop, MD, Marja Kays, Technician,                            Doristine Mango, RN Referring MD:              Medicines:                Monitored Anesthesia Care Complications:            No immediate complications. Estimated Blood Loss:     Estimated blood loss: none. Procedure:                Pre-Anesthesia Assessment:                           - Prior to the procedure, a History and Physical                            was performed, and patient medications and                            allergies were reviewed. The patient's tolerance of                            previous anesthesia was also reviewed. The risks                            and benefits of the procedure and the sedation                            options and risks were discussed with the patient.                            All questions were answered, and informed consent                            was obtained. Prior Anticoagulants: The patient has                            taken no anticoagulant or antiplatelet agents. ASA                            Grade Assessment: III - A patient with severe                            systemic disease. After reviewing the risks and                            benefits,  the patient was deemed in satisfactory                            condition to undergo the procedure.                           After obtaining informed consent, the colonoscope                            was passed under direct vision. Throughout the                            procedure, the patient's blood pressure, pulse, and                            oxygen saturations were  monitored continuously. The                            [id number]-HQ190L ([id number]) Olympus colonoscope was                            introduced through the anus and advanced to the the                            cecum, identified by appendiceal orifice and                            ileocecal valve. The colonoscopy was performed                            without difficulty. The patient tolerated the                            procedure well. The quality of the bowel                            preparation was fair. The ileocecal valve,                            appendiceal orifice, and rectum were photographed.                            The bowel preparation used was Miralax via split                            dose instruction. Scope In: 9:55:55 AM Scope Out: 10:26:08 AM Scope Withdrawal Time: 0 hours 20 minutes 24 seconds  Total Procedure Duration: 0 hours 30 minutes 13 seconds  Findings:      The perianal exam findings include changes of hidradenitis suppurative.      Multiple diverticula were found in the sigmoid colon. There was active       bleeding coming from the diverticular opening. The bleeding stopped and       I had to search to relocate - I found a diverticulum with clot inside.  To stop further active bleeding, three hemostatic clips were       successfully placed (MR conditional). Clip manufacturer: [employer]. There was no bleeding at the end of the procedure.      Multiple diverticula were found in the entire colon.      Non-bleeding internal hemorrhoids were found.      The exam was otherwise without abnormality on direct and retroflexion       views. Impression:               - Preparation of the colon was fair.                           - Changes of hidradenitis suppurative found on                            perianal exam.                           - Severe diverticulosis in the sigmoid colon. There                            was active bleeding  coming from one proximal                            sigmouid diverticular opening. The bleeding stopped                            and I had to reloacte the vbleeding diverticulum -                            found clot inside and am reasonably but not 100%                            confident this was the culprit diverticulum. Clips                            (MR conditional) were placed. Clip manufacturer:                            [employer].                           - Severe diverticulosis in the entire examined                            colon.                           - Non-bleeding internal hemorrhoids.                           - The examination was otherwise normal on direct                            and retroflexion views.                           -  No specimens collected. Moderate Sedation:      Not Applicable - Patient had care per Anesthesia. Recommendation:           - Return patient to hospital ward for ongoing care.                           - Clear liquid diet.                           - If there are signs of significant recurrent                            bleeding would get IR consult (she may have some                            stuttering bleeding so we can observe but if major                            hematochezia, hypotension, msig decline in Hgb ask                            IR to evaluate). I believe clips are on the                            bleeding diverticulum but as stated above not 100%                            certain.                           Would transfuse 1 unit PRBC given recent Hgb 7.9                            and active bleeding + age/co-morbidities Procedure Code(s):        --- Professional ---                           [phone], Colonoscopy, flexible; with control of                            bleeding, any method Diagnosis Code(s):        --- Professional ---                           K64.8, Other hemorrhoids                            K57.31, Diverticulosis of large intestine without                            perforation or abscess with bleeding                           K92.1, Melena (includes Hematochezia) CPT copyright 2022 [employer]. All rights reserved. The codes documented in this report are  preliminary and upon coder review may  be revised to meet current compliance requirements. Iva Boop, MD 05/23/2023 10:46:15 AM This report has been signed electronically. Number of Addenda: 0

## 2023-05-23 NOTE — Progress Notes (Signed)
 [facility]                                                                               Kristy Harris, is a 78 y.o. female, DOB - 1945-12-20, ZOX:[id number] Admit date - 05/21/2023    Outpatient Primary MD for the patient is Hemberg, Ruby Cola, NP  LOS - 0  days    Brief summary    Kristy Harris is a 78 y.o. female with medical history significant of chronic iron deficiency anemia, history of breast cancer, COPD, hypertension, hyperlipidemia, type 2 diabetes on oral hypoglycemics, history of diverticulosis presented to the emergency room with 2 episodes of bright red blood, per rectum.  She was admitted for evaluation of rectal bleed, suspicious for a diverticular bleed. Improving.  Hemoglobin around 7 and stable. GI on board.    Assessment & Plan    Assessment and Plan:   Hematochezia:  Suspect from known diverticulosis.  Clears as tolerated.  GI on board and appreciate recommendations.  Hemoglobin stable around 7.  Underwent colonoscopy showing severe diverticulosis In the entire colon. She also had active bleeding from one of the diverticula in the sigmoid colon, underwent hemostat clips placed.  If she has recurrent bleeding will request IR and CTA of the abdomen and pelvis,.  Will continue to monitor hemoglobin.  1 unit of prbc transfusion ordered. Recheck H&H later tonight.    COPD; stable.  No wheezing heard.    Iron deficiency anemia:  Low iron levels .  Will plan for iron supplementation on discharge.    Hypertension;  Optimal.    Type 2 DM;  CBG (last 3)  Recent Labs    05/23/23 0747 05/23/23 1137 05/23/23 1557  GLUCAP 96 105* 93   Resume SSI.  No changes in meds.  On clear liquid diet.    Hydradenitis suppurativa on the buttocks.     RN Pressure Injury Documentation: Pressure Injury 11/02/18 Buttocks Right;Left Stage II -  Partial thickness loss of dermis presenting as a shallow open ulcer with a red, pink wound bed  without slough. (Active)  11/02/18 2215  Location: Buttocks  Location Orientation: Right;Left  Staging: Stage II -  Partial thickness loss of dermis presenting as a shallow open ulcer with a red, pink wound bed without slough.  Wound Description (Comments):   Present on Admission: Yes   Wound care consulted    Estimated body mass index is 34.8 kg/m as calculated from the following:   Height as of this encounter: 5\' 3"  (1.6 m).   Weight as of this encounter: 89.1 kg.  Code Status: full code.  DVT Prophylaxis:  SCDs Start: 05/21/23 1929   Level of Care: Level of care: Med-Surg Family Communication: none at bedside.   Disposition Plan:     Remains inpatient appropriate:  eval for hematochezia.   Procedures:  Colonoscopy.   Consultants:   Gastroenterology Wound care.   Antimicrobials:   Anti-infectives (From admission, onward)    None        Medications  Scheduled Meds:  anastrozole  1 mg Oral Daily   atorvastatin  20 mg Oral q morning   cholecalciferol  2,000  Units Oral Daily   ferrous sulfate  325 mg Oral TID WC   insulin aspart  0-9 Units Subcutaneous TID WC   losartan  100 mg Oral Daily   metFORMIN  500 mg Oral BID WC   metoprolol succinate  100 mg Oral Daily   pantoprazole  40 mg Oral Daily   Continuous Infusions:  sodium chloride 10 mL (05/22/23 1749)   PRN Meds:.acetaminophen, albuterol, sorbitol    Subjective:   Kristy Harris was seen and examined today.  No bloody bowel movements today.   Objective:   Vitals:   05/23/23 1108 05/23/23 1300 05/23/23 1302 05/23/23 1319  BP: (!) 113/49 (!) 142/66 (!) 142/66 (!) 108/58  Pulse: 79 82 82 78  Resp: 16 20 20 20   Temp: 97.9 F (36.6 C) 97.6 F (36.4 C) 98.4 F (36.9 C) (!) 97.5 F (36.4 C)  TempSrc:   Oral Oral  SpO2: 99%  99% 98%  Weight:      Height:        Intake/Output Summary (Last 24 hours) at 05/23/2023 1606 Last data filed at 05/23/2023 1300 Gross per 24 hour  Intake 400 ml   Output 303 ml  Net 97 ml   Filed Weights   05/21/23 1126  Weight: 89.1 kg     Exam General exam: Appears calm and comfortable  Respiratory system: Clear to auscultation. Respiratory effort normal. Cardiovascular system: S1 & S2 heard, RRR. No JVD, Gastrointestinal system: Abdomen is nondistended, soft and nontender.  Central nervous system: Alert and oriented. Extremities: Symmetric 5 x 5 power. Skin: No rashes,  Psychiatry: Mood & affect appropriate.     Data Reviewed:  I have personally reviewed following labs and imaging studies   CBC Lab Results  Component Value Date   WBC 9.5 05/23/2023   RBC 3.03 (L) 05/23/2023   HGB 7.8 (L) 05/23/2023   HCT 25.9 (L) 05/23/2023   MCV 85.5 05/23/2023   MCH 25.4 (L) 05/23/2023   PLT 315 05/23/2023   MCHC 29.7 (L) 05/23/2023   RDW 15.6 (H) 05/23/2023   LYMPHSABS 1.2 05/23/2023   MONOABS 0.6 05/23/2023   EOSABS 0.2 05/23/2023   BASOSABS 0.0 05/23/2023     Last metabolic panel Lab Results  Component Value Date   NA 137 05/22/2023   K 4.0 05/22/2023   CL 106 05/22/2023   CO2 25 05/22/2023   BUN 17 05/22/2023   CREATININE 0.81 05/22/2023   GLUCOSE 99 05/22/2023   GFRNONAA >60 05/22/2023   GFRAA >60 09/26/2019   CALCIUM 8.6 (L) 05/22/2023   PROT 6.7 05/22/2023   ALBUMIN 3.0 (L) 05/22/2023   LABGLOB 4.3 (H) 07/14/2022   AGRATIO 0.6 (L) 07/14/2022   BILITOT 0.4 05/22/2023   ALKPHOS 71 05/22/2023   AST 9 (L) 05/22/2023   ALT 9 05/22/2023   ANIONGAP 6 05/22/2023    CBG (last 3)  Recent Labs    05/23/23 0747 05/23/23 1137 05/23/23 1557  GLUCAP 96 105* 93      Coagulation Profile: No results for input(s): "INR", "PROTIME" in the last 168 hours.   Radiology Studies: No results found.     Kathlen Mody M.D. [facility] 05/23/2023, 4:06 PM  Available via Epic secure chat 7am-7pm After 7 pm, please refer to night coverage provider listed on amion.

## 2023-05-23 NOTE — Plan of Care (Signed)
   Problem: Coping: Goal: Ability to adjust to condition or change in health will improve Outcome: Progressing

## 2023-05-23 NOTE — Plan of Care (Signed)
   Problem: Education: Goal: Ability to describe self-care measures that may prevent or decrease complications (Diabetes Survival Skills Education) will improve Outcome: Progressing

## 2023-05-24 DIAGNOSIS — Z6834 Body mass index (BMI) 34.0-34.9, adult: Secondary | ICD-10-CM | POA: Diagnosis not present

## 2023-05-24 DIAGNOSIS — E785 Hyperlipidemia, unspecified: Secondary | ICD-10-CM

## 2023-05-24 DIAGNOSIS — K648 Other hemorrhoids: Secondary | ICD-10-CM | POA: Diagnosis present

## 2023-05-24 DIAGNOSIS — I129 Hypertensive chronic kidney disease with stage 1 through stage 4 chronic kidney disease, or unspecified chronic kidney disease: Secondary | ICD-10-CM | POA: Diagnosis present

## 2023-05-24 DIAGNOSIS — K5731 Diverticulosis of large intestine without perforation or abscess with bleeding: Secondary | ICD-10-CM | POA: Diagnosis present

## 2023-05-24 DIAGNOSIS — Z7984 Long term (current) use of oral hypoglycemic drugs: Secondary | ICD-10-CM | POA: Diagnosis not present

## 2023-05-24 DIAGNOSIS — Z79899 Other long term (current) drug therapy: Secondary | ICD-10-CM | POA: Diagnosis not present

## 2023-05-24 DIAGNOSIS — Z923 Personal history of irradiation: Secondary | ICD-10-CM | POA: Diagnosis not present

## 2023-05-24 DIAGNOSIS — E1122 Type 2 diabetes mellitus with diabetic chronic kidney disease: Secondary | ICD-10-CM | POA: Diagnosis present

## 2023-05-24 DIAGNOSIS — Z87891 Personal history of nicotine dependence: Secondary | ICD-10-CM | POA: Diagnosis not present

## 2023-05-24 DIAGNOSIS — Z880 Allergy status to penicillin: Secondary | ICD-10-CM | POA: Diagnosis not present

## 2023-05-24 DIAGNOSIS — E1169 Type 2 diabetes mellitus with other specified complication: Secondary | ICD-10-CM | POA: Diagnosis not present

## 2023-05-24 DIAGNOSIS — K219 Gastro-esophageal reflux disease without esophagitis: Secondary | ICD-10-CM | POA: Diagnosis present

## 2023-05-24 DIAGNOSIS — Z79811 Long term (current) use of aromatase inhibitors: Secondary | ICD-10-CM | POA: Diagnosis not present

## 2023-05-24 DIAGNOSIS — Z1152 Encounter for screening for COVID-19: Secondary | ICD-10-CM | POA: Diagnosis not present

## 2023-05-24 DIAGNOSIS — I10 Essential (primary) hypertension: Secondary | ICD-10-CM

## 2023-05-24 DIAGNOSIS — L732 Hidradenitis suppurativa: Secondary | ICD-10-CM | POA: Diagnosis present

## 2023-05-24 DIAGNOSIS — Z853 Personal history of malignant neoplasm of breast: Secondary | ICD-10-CM | POA: Diagnosis not present

## 2023-05-24 DIAGNOSIS — J449 Chronic obstructive pulmonary disease, unspecified: Secondary | ICD-10-CM | POA: Diagnosis present

## 2023-05-24 DIAGNOSIS — N182 Chronic kidney disease, stage 2 (mild): Secondary | ICD-10-CM | POA: Diagnosis present

## 2023-05-24 DIAGNOSIS — Z888 Allergy status to other drugs, medicaments and biological substances status: Secondary | ICD-10-CM | POA: Diagnosis not present

## 2023-05-24 DIAGNOSIS — D509 Iron deficiency anemia, unspecified: Secondary | ICD-10-CM | POA: Diagnosis present

## 2023-05-24 DIAGNOSIS — E669 Obesity, unspecified: Secondary | ICD-10-CM | POA: Diagnosis present

## 2023-05-24 DIAGNOSIS — D62 Acute posthemorrhagic anemia: Secondary | ICD-10-CM | POA: Diagnosis present

## 2023-05-24 DIAGNOSIS — K921 Melena: Secondary | ICD-10-CM | POA: Diagnosis present

## 2023-05-24 DIAGNOSIS — Z803 Family history of malignant neoplasm of breast: Secondary | ICD-10-CM | POA: Diagnosis not present

## 2023-05-24 DIAGNOSIS — Z8249 Family history of ischemic heart disease and other diseases of the circulatory system: Secondary | ICD-10-CM | POA: Diagnosis not present

## 2023-05-24 LAB — BASIC METABOLIC PANEL
Anion gap: 7 (ref 5–15)
BUN: 8 mg/dL (ref 8–23)
CO2: 25 mmol/L (ref 22–32)
Calcium: 8.8 mg/dL — ABNORMAL LOW (ref 8.9–10.3)
Chloride: 105 mmol/L (ref 98–111)
Creatinine, Ser: 0.76 mg/dL (ref 0.44–1.00)
GFR, Estimated: >60 mL/min (ref >60)
Glucose, Bld: 112 mg/dL — ABNORMAL HIGH (ref 70–99)
Potassium: 4.2 mmol/L (ref 3.5–5.1)
Sodium: 137 mmol/L (ref 135–145)

## 2023-05-24 LAB — CBC WITH DIFFERENTIAL/PLATELET
Abs Immature Granulocytes: 0.05 10*3/uL (ref 0.00–0.07)
Basophils Absolute: 0 10*3/uL (ref 0.0–0.1)
Basophils Relative: 0 %
Eosinophils Absolute: 0.3 10*3/uL (ref 0.0–0.5)
Eosinophils Relative: 3 %
HCT: 26.8 % — ABNORMAL LOW (ref 36.0–46.0)
Hemoglobin: 8.1 g/dL — ABNORMAL LOW (ref 12.0–15.0)
Immature Granulocytes: 0 %
Lymphocytes Relative: 13 %
Lymphs Abs: 1.5 10*3/uL (ref 0.7–4.0)
MCH: 25.5 pg — ABNORMAL LOW (ref 26.0–34.0)
MCHC: 30.2 g/dL (ref 30.0–36.0)
MCV: 84.3 fL (ref 80.0–100.0)
Monocytes Absolute: 0.7 10*3/uL (ref 0.1–1.0)
Monocytes Relative: 6 %
Neutro Abs: 9.1 10*3/uL — ABNORMAL HIGH (ref 1.7–7.7)
Neutrophils Relative %: 78 %
Platelets: 296 10*3/uL (ref 150–400)
RBC: 3.18 MIL/uL — ABNORMAL LOW (ref 3.87–5.11)
RDW: 16.9 % — ABNORMAL HIGH (ref 11.5–15.5)
WBC: 11.7 10*3/uL — ABNORMAL HIGH (ref 4.0–10.5)
nRBC: 0 % (ref 0.0–0.2)

## 2023-05-24 LAB — GLUCOSE, CAPILLARY
Glucose-Capillary: 102 mg/dL — ABNORMAL HIGH (ref 70–99)
Glucose-Capillary: 98 mg/dL (ref 70–99)
Glucose-Capillary: 121 mg/dL — ABNORMAL HIGH (ref 70–99)
Glucose-Capillary: 144 mg/dL — ABNORMAL HIGH (ref 70–99)

## 2023-05-24 LAB — HEMOGLOBIN: Hemoglobin: 8.1 g/dL — ABNORMAL LOW (ref 12.0–15.0)

## 2023-05-24 NOTE — Progress Notes (Signed)
   Patient Name: Kristy Harris Date of Encounter: 05/24/2023, 12:44 PM     Assessment and Plan  Diverticulosis w/ hemorrhage s/p clipping of bleeding sigmoid diverticulum  yesterday  Decreased Hgb/Acute blood loss anemia + chronic disease anemia  ---------------------------------------------------------------------------------------------------------------------------  No bleeding since yesterday - Hgb improved after 1U RBC  Would observe and if no further bleeding then home tomorrow, w/ [facility] f/u - colonoscopy prep was fair so would be appropriate but not essential to consider repeat exam w/in 1 year -     Subjective  Eating soft diet now - no pain and no stool or bleeding since colonoscopy 1 U RBC given yesterday   Objective  BP 135/63 (BP Location: Right Arm)   Pulse 70   Temp 98.6 F (37 C)   Resp 17   Ht 5\' 3"  (1.6 m)   Wt 89.1 kg   SpO2 99%   BMI 34.80 kg/m    Recent Labs  Lab 05/22/23 1124 05/23/23 0149 05/23/23 1124 05/23/23 1150 05/23/23 2317 05/24/23 0437  HGB 7.7*   < > 7.7* 7.8* 8.4* 8.1*  HCT 25.7*  --  25.9*  --   --  26.8*  WBC 8.9  --  9.5  --   --  11.7*  PLT 310  --  315  --   --  296   < > = values in this interval not displayed.    COLONOSCOPY 05/23/23 - Preparation of the colon was fair. - Changes of hidradenitis suppurative found on perianal exam. - Severe diverticulosis in the sigmoid colon. There was active bleeding coming from one proximal sigmouid diverticular opening. The bleeding stopped and I had to relocate the bleeding diverticulum - found clot inside and am reasonably but not 100% confident this was the culprit diverticulum. Clips (MR conditional) were placed. Clip manufacturer: [employer]. - Severe diverticulosis in the entire examined colon. - Non-bleeding internal hemorrhoids. - The examination was otherwise normal     Iva Boop, MD, [facility] Gastroenterology See Loretha Stapler on call - gastroenterology for best  contact person 05/24/2023 12:44 PM

## 2023-05-24 NOTE — Evaluation (Signed)
 Physical Therapy One Time Evaluation Patient Details Name: Kristy Harris MRN: [id number] DOB: 1945-07-24 Today's Date: 05/24/2023  History of Present Illness  78 y.o. female admitted on 05/21/23 for Hematochezia.  Past medical history significant of chronic iron deficiency anemia, history of breast cancer, COPD, hypertension, hyperlipidemia, type 2 diabetes on oral hypoglycemics, diverticulosis, Hydradenitis suppurativa  Clinical Impression  Patient evaluated by Physical Therapy with no further acute PT needs identified. All education has been completed and the patient has no further questions.  Pt reports no PT needs at this time. Pt has a caregiver and daughter to assist as needed. No further follow-up Physical Therapy or equipment needs. PT is signing off. Thank you for this referral.         If plan is discharge home, recommend the following:     Can travel by private vehicle        Equipment Recommendations None recommended by PT  Recommendations for Other Services       Functional Status Assessment Patient has not had a recent decline in their functional status     Precautions / Restrictions Precautions Precautions: None      Mobility  Bed Mobility Overal bed mobility: Modified Independent                  Transfers Overall transfer level: Modified independent                      Ambulation/Gait Ambulation/Gait assistance: Supervision, Modified independent (Device/Increase time) Gait Distance (Feet): 180 Feet Assistive device: Rolling walker (2 wheels) Gait Pattern/deviations: WFL(Within Functional Limits)       General Gait Details: pt feels at baseline, even reports ambulating farther distance then her normal  Stairs            Wheelchair Mobility     Tilt Bed    Modified Rankin (Stroke Patients Only)       Balance Overall balance assessment: No apparent balance deficits (not formally assessed)                                            Pertinent Vitals/Pain Pain Assessment Pain Assessment: No/denies pain    Home Living Family/patient expects to be discharged to:: Private residence Living Arrangements: Children Available Help at Discharge: Family;Personal care attendant Type of Home: Apartment Home Access: Ramped entrance       Home Layout: One level Home Equipment: Agricultural consultant (2 wheels);Rollator (4 wheels);Cane - single point;Grab bars - tub/shower;Wheelchair - manual;BSC/3in1      Prior Function Prior Level of Function : Independent/Modified Independent               ADLs Comments: reports assist from caregiver with bathing and dressing; daughter assists with wound dressing changes - hx hydradenitis suppurativa     Extremity/Trunk Assessment   Upper Extremity Assessment Upper Extremity Assessment: Overall WFL for tasks assessed    Lower Extremity Assessment Lower Extremity Assessment: Overall WFL for tasks assessed       Communication   Communication Communication: No apparent difficulties    Cognition Arousal: Alert Behavior During Therapy: WFL for tasks assessed/performed   PT - Cognitive impairments: No apparent impairments                         Following commands: Intact  Cueing       General Comments      Exercises     Assessment/Plan    PT Assessment Patient does not need any further PT services  PT Problem List         PT Treatment Interventions      PT Goals (Current goals can be found in the Care Plan section)  Acute Rehab PT Goals PT Goal Formulation: All assessment and education complete, DC therapy    Frequency       Co-evaluation               AM-PAC PT "6 Clicks" Mobility  Outcome Measure Help needed turning from your back to your side while in a flat bed without using bedrails?: None Help needed moving from lying on your back to sitting on the side of a flat bed without using bedrails?:  None Help needed moving to and from a bed to a chair (including a wheelchair)?: None Help needed standing up from a chair using your arms (e.g., wheelchair or bedside chair)?: None Help needed to walk in hospital room?: None Help needed climbing 3-5 steps with a railing? : A Little 6 Click Score: 23    End of Session   Activity Tolerance: Patient tolerated treatment well Patient left: in bed;with call bell/phone within reach;with bed alarm set   PT Visit Diagnosis: Difficulty in walking, not elsewhere classified (R26.2)    Time: 9562-1308 PT Time Calculation (min) (ACUTE ONLY): 25 min (RN in room doing dressing changes)  Charges:   PT Evaluation $PT Eval Low Complexity: 1 Low   PT General Charges $$ ACUTE PT VISIT: 1 Visit       Thomasene Mohair PT, DPT Physical Therapist Acute Rehabilitation Services Office: [phone]   Carlyon Prows 05/24/2023, 12:33 PM

## 2023-05-24 NOTE — Plan of Care (Signed)

## 2023-05-24 NOTE — Plan of Care (Signed)

## 2023-05-24 NOTE — Progress Notes (Signed)
 [facility]                                                                               Justine Dines, is a 78 y.o. female, DOB - 05-Jun-1945, WJX:[id number] Admit date - 05/21/2023    Outpatient Primary MD for the patient is Hemberg, Ruby Cola, NP  LOS - 0  days    Brief summary    Kristy Harris is a 78 y.o. female with medical history significant of chronic iron deficiency anemia, history of breast cancer, COPD, hypertension, hyperlipidemia, type 2 diabetes on oral hypoglycemics, history of diverticulosis presented to the emergency room with 2 episodes of bright red blood, per rectum.  She was admitted for evaluation of rectal bleed, suspicious for a diverticular bleed. Improving.  Hemoglobin around 7 and stable. GI on board. S/p    Assessment & Plan    Assessment and Plan:   Hematochezia:  Suspect from known diverticulosis.  Underwent colonoscopy showing severe diverticulosis In the entire colon. She also had active bleeding from one of the diverticula in the sigmoid colon, underwent hemostat clips placed.  If she has recurrent bleeding will request IR and CTA of the abdomen and pelvis,.  Will continue to monitor hemoglobin.  1 unit of prbc transfusion ordered.  Advance diet as tolerated.    COPD; stable.  No wheezing heard.    Iron deficiency anemia:  Low iron levels .  Will plan for iron supplementation on discharge.    Hypertension;  Well controlled.    Type 2 DM;  CBG (last 3)  Recent Labs    05/23/23 2044 05/24/23 0801 05/24/23 1145  GLUCAP 115* 102* 98   Resume SSI.  No changes in meds.    Hydradenitis suppurativa on the buttocks.  Local wound care.    RN Pressure Injury Documentation: Pressure Injury 11/02/18 Buttocks Right;Left Stage II -  Partial thickness loss of dermis presenting as a shallow open ulcer with a red, pink wound bed without slough. (Active)  11/02/18 2215  Location: Buttocks  Location Orientation:  Right;Left  Staging: Stage II -  Partial thickness loss of dermis presenting as a shallow open ulcer with a red, pink wound bed without slough.  Wound Description (Comments):   Present on Admission: Yes   Wound care consulted    Estimated body mass index is 34.8 kg/m as calculated from the following:   Height as of this encounter: 5\' 3"  (1.6 m).   Weight as of this encounter: 89.1 kg.  Code Status: full code.  DVT Prophylaxis:  SCDs Start: 05/21/23 1929   Level of Care: Level of care: Med-Surg Family Communication: none at bedside.   Disposition Plan:     Remains inpatient appropriate:  eval for hematochezia.   Procedures:  Colonoscopy.   Consultants:   Gastroenterology Wound care.   Antimicrobials:   Anti-infectives (From admission, onward)    None        Medications  Scheduled Meds:  anastrozole  1 mg Oral Daily   atorvastatin  20 mg Oral q morning   cholecalciferol  2,000 Units Oral Daily   ferrous sulfate  325 mg Oral TID WC  insulin aspart  0-9 Units Subcutaneous TID WC   losartan  100 mg Oral Daily   metFORMIN  500 mg Oral BID WC   metoprolol succinate  100 mg Oral Daily   pantoprazole  40 mg Oral Daily   Continuous Infusions:   PRN Meds:.acetaminophen, albuterol, sorbitol    Subjective:   Yari Szeliga was seen and examined today. No bleeding today. No abd pain.   Objective:   Vitals:   05/23/23 1640 05/23/23 1937 05/24/23 0451 05/24/23 1256  BP: (!) 142/68 (!) 154/69 135/63 (!) 114/49  Pulse: 80 79 70 75  Resp: 20 17 17 16   Temp: 97.9 F (36.6 C) 98.5 F (36.9 C) 98.6 F (37 C) 98 F (36.7 C)  TempSrc: Oral     SpO2: 99% 98% 99% 100%  Weight:      Height:        Intake/Output Summary (Last 24 hours) at 05/24/2023 1610 Last data filed at 05/24/2023 0500 Gross per 24 hour  Intake --  Output 1150 ml  Net -1150 ml   Filed Weights   05/21/23 1126  Weight: 89.1 kg     Exam General exam: Appears calm and comfortable   Respiratory system: Clear to auscultation. Respiratory effort normal. Cardiovascular system: S1 & S2 heard, RRR. No JVD Gastrointestinal system: Abdomen is nondistended, soft and nontender. Central nervous system: Alert and oriented.  Extremities: Symmetric 5 x 5 power. Skin: No rashes, Psychiatry: Mood & affect appropriate.      Data Reviewed:  I have personally reviewed following labs and imaging studies   CBC Lab Results  Component Value Date   WBC 11.7 (H) 05/24/2023   RBC 3.18 (L) 05/24/2023   HGB 8.1 (L) 05/24/2023   HCT 26.8 (L) 05/24/2023   MCV 84.3 05/24/2023   MCH 25.5 (L) 05/24/2023   PLT 296 05/24/2023   MCHC 30.2 05/24/2023   RDW 16.9 (H) 05/24/2023   LYMPHSABS 1.5 05/24/2023   MONOABS 0.7 05/24/2023   EOSABS 0.3 05/24/2023   BASOSABS 0.0 05/24/2023     Last metabolic panel Lab Results  Component Value Date   NA 137 05/24/2023   K 4.2 05/24/2023   CL 105 05/24/2023   CO2 25 05/24/2023   BUN 8 05/24/2023   CREATININE 0.76 05/24/2023   GLUCOSE 112 (H) 05/24/2023   GFRNONAA >60 05/24/2023   GFRAA >60 09/26/2019   CALCIUM 8.8 (L) 05/24/2023   PROT 6.7 05/22/2023   ALBUMIN 3.0 (L) 05/22/2023   LABGLOB 4.3 (H) 07/14/2022   AGRATIO 0.6 (L) 07/14/2022   BILITOT 0.4 05/22/2023   ALKPHOS 71 05/22/2023   AST 9 (L) 05/22/2023   ALT 9 05/22/2023   ANIONGAP 7 05/24/2023    CBG (last 3)  Recent Labs    05/23/23 2044 05/24/23 0801 05/24/23 1145  GLUCAP 115* 102* 98      Coagulation Profile: No results for input(s): "INR", "PROTIME" in the last 168 hours.   Radiology Studies: No results found.     Kathlen Mody M.D. [facility] 05/24/2023, 4:10 PM  Available via Epic secure chat 7am-7pm After 7 pm, please refer to night coverage provider listed on amion.

## 2023-05-25 ENCOUNTER — Encounter (HOSPITAL_COMMUNITY): Payer: Self-pay | Admitting: Internal Medicine

## 2023-05-25 DIAGNOSIS — K5731 Diverticulosis of large intestine without perforation or abscess with bleeding: Secondary | ICD-10-CM | POA: Diagnosis not present

## 2023-05-25 DIAGNOSIS — K921 Melena: Secondary | ICD-10-CM | POA: Diagnosis not present

## 2023-05-25 DIAGNOSIS — I10 Essential (primary) hypertension: Secondary | ICD-10-CM | POA: Diagnosis not present

## 2023-05-25 LAB — BPAM RBC
Blood Product Expiration Date: 202504082359
ISSUE DATE / TIME: 202503221250
Unit Type and Rh: 7300

## 2023-05-25 LAB — TYPE AND SCREEN
ABO/RH(D): B POS
Antibody Screen: NEGATIVE
Unit division: 0

## 2023-05-25 LAB — GLUCOSE, CAPILLARY
Glucose-Capillary: 101 mg/dL — ABNORMAL HIGH (ref 70–99)
Glucose-Capillary: 120 mg/dL — ABNORMAL HIGH (ref 70–99)
Glucose-Capillary: 125 mg/dL — ABNORMAL HIGH (ref 70–99)
Glucose-Capillary: 111 mg/dL — ABNORMAL HIGH (ref 70–99)
Glucose-Capillary: 117 mg/dL — ABNORMAL HIGH (ref 70–99)

## 2023-05-25 LAB — HEMOGLOBIN
Hemoglobin: 8 g/dL — ABNORMAL LOW (ref 12.0–15.0)
Hemoglobin: 8.2 g/dL — ABNORMAL LOW (ref 12.0–15.0)

## 2023-05-25 MED ORDER — POLYETHYLENE GLYCOL 3350 17 G PO PACK
17.0000 g | PACK | Freq: Every day | ORAL | Status: DC
  Administered 2023-05-25 – 2023-05-26 (×2): 17 g via ORAL
  Filled 2023-05-25 (×2): qty 1

## 2023-05-25 MED ORDER — SENNOSIDES-DOCUSATE SODIUM 8.6-50 MG PO TABS
2.0000 | ORAL_TABLET | Freq: Two times a day (BID) | ORAL | Status: DC
  Administered 2023-05-25 – 2023-05-26 (×3): 2 via ORAL
  Filled 2023-05-25 (×3): qty 2

## 2023-05-25 NOTE — Progress Notes (Signed)
 Abscess to buttocks assessed and wound care performed. Wound was cleansed, dried, and dressed per wound care order. Patient tolerated well. Will continue to monitor.

## 2023-05-25 NOTE — Anesthesia Postprocedure Evaluation (Signed)
 Anesthesia Post Note  Patient: Kristy Harris  Procedure(s) Performed: COLONOSCOPY CONTROL OF HEMORRHAGE, GI TRACT, ENDOSCOPIC, BY CLIPPING OR OVERSEWING     Patient location during evaluation: Endoscopy Anesthesia Type: MAC Level of consciousness: awake and alert Pain management: pain level controlled Vital Signs Assessment: post-procedure vital signs reviewed and stable Respiratory status: spontaneous breathing, nonlabored ventilation, respiratory function stable and patient connected to nasal cannula oxygen Cardiovascular status: blood pressure returned to baseline and stable Postop Assessment: no apparent nausea or vomiting Anesthetic complications: no  No notable events documented.  Last Vitals:  Vitals:   05/25/23 0949 05/25/23 0955  BP: 134/77 134/77  Pulse: 80 80  Resp: 18 18  Temp: 36.7 C 36.7 C  SpO2: 98% 98%    Last Pain:  Vitals:   05/25/23 1034  TempSrc:   PainSc: 2                  Heela Heishman L Lakita Sahlin

## 2023-05-25 NOTE — Progress Notes (Addendum)
     [facility] Progress Note  CC:  GI bleed  Subjective:  Feels well.  No bleeding since 3/22 and Hgb is stable.  Eating well.  No abdominal pain.  Objective:  Vital signs in last 24 hours: Temp:  [98 F (36.7 C)-98.3 F (36.8 C)] 98.1 F (36.7 C) (03/24 0955) Pulse Rate:  [75-88] 80 (03/24 0955) Resp:  [16-18] 18 (03/24 0955) BP: (114-155)/(49-84) 134/77 (03/24 0955) SpO2:  [98 %-100 %] 98 % (03/24 0955) Last BM Date : 05/23/23 General:  Alert, Well-developed, in NAD Heart:  Regular rate and rhythm; murmur noted. Pulm:  CTAB. No W/R/R. Abdomen:  Soft, non-distended.  BS present.  Non-tender. Extremities:  Without edema. Neurologic:  Alert and oriented x 4;  grossly normal neurologically. Psych:  Alert and cooperative. Normal mood and affect.  Intake/Output from previous day: 03/23 0701 - 03/24 0700 In: -  Out: 400 [Urine:400] Intake/Output this shift: Total I/O In: 118 [P.O.:118] Out: -   Lab Results: Recent Labs    05/23/23 1124 05/23/23 1150 05/24/23 0437 05/24/23 1425 05/25/23 0011  WBC 9.5  --  11.7*  --   --   HGB 7.7*   < > 8.1* 8.1* 8.2*  HCT 25.9*  --  26.8*  --   --   PLT 315  --  296  --   --    < > = values in this interval not displayed.   BMET Recent Labs    05/24/23 0437  NA 137  K 4.2  CL 105  CO2 25  GLUCOSE 112*  BUN 8  CREATININE 0.76  CALCIUM 8.8*   Assessment / Plan: Diverticulosis w/ hemorrhage s/p clipping of bleeding sigmoid diverticulum 05/23/23.   Decreased Hgb/Acute blood loss anemia + chronic disease anemia   --------------------------------------------------------------------------------------------------------------------------- No bleeding since 3/22 - Hgb improved after 1U RBC and remains stable for the past couple of days at 8.2 grams.   Ok for discharge home today.  Has follow-up in our office in May.  Appt is in discharge information - colonoscopy prep was fair so would be appropriate but not  essential to consider repeat exam w/in 1 year.   LOS: 1 day   Princella Pellegrini. Preet Perrier  05/25/2023, 11:56 AM

## 2023-05-25 NOTE — Plan of Care (Signed)

## 2023-05-25 NOTE — Plan of Care (Signed)
  Problem: Coping: Goal: Ability to adjust to condition or change in health will improve Outcome: Progressing   Problem: Clinical Measurements: Goal: Ability to maintain clinical measurements within normal limits will improve Outcome: Progressing   Problem: Coping: Goal: Level of anxiety will decrease Outcome: Progressing

## 2023-05-25 NOTE — Progress Notes (Signed)
 [facility]                                                                               Kristy Harris, is a 78 y.o. female, DOB - 19-Sep-1945, QVZ:[id number] Admit date - 05/21/2023    Outpatient Primary MD for the patient is Hemberg, Ruby Cola, NP  LOS - 1  days    Brief summary    Kristy Harris is a 78 y.o. female with medical history significant of chronic iron deficiency anemia, history of breast cancer, COPD, hypertension, hyperlipidemia, type 2 diabetes on oral hypoglycemics, history of diverticulosis presented to the emergency room with 2 episodes of bright red blood, per rectum.  She was admitted for evaluation of rectal bleed, suspicious for a diverticular bleed. Improving.  Hemoglobin around 7 and stable. GI on board. S/p 1 u;nit of prbc transfusion.    Assessment & Plan    Assessment and Plan:   Hematochezia:  Suspect from known diverticulosis.  Underwent colonoscopy showing severe diverticulosis In the entire colon. She also had active bleeding from one of the diverticula in the sigmoid colon, underwent hemostat clips placed.  If she has recurrent bleeding will request IR and CTA of the abdomen and pelvis,.  Will continue to monitor hemoglobin.  1 unit of prbc transfusion ordered.  Advance diet as tolerated.  Hemoglobin stable around 8.  Patient wants to wait to have a BM before going home.  Senna, colace and miralax ordered.    COPD; stable.  No wheezing heard.    Iron deficiency anemia:  Low iron levels .  Will plan for iron supplementation on discharge.    Hypertension;  Optimal.    Type 2 DM;  CBG (last 3)  Recent Labs    05/25/23 0748 05/25/23 1221 05/25/23 1637  GLUCAP 101* 120* 125*   Resume SSI.  No changes in meds.    Hydradenitis suppurativa on the buttocks.  Local wound care.    RN Pressure Injury Documentation: Pressure Injury 11/02/18 Buttocks Right;Left Stage II -  Partial thickness loss of dermis  presenting as a shallow open ulcer with a red, pink wound bed without slough. (Active)  11/02/18 2215  Location: Buttocks  Location Orientation: Right;Left  Staging: Stage II -  Partial thickness loss of dermis presenting as a shallow open ulcer with a red, pink wound bed without slough.  Wound Description (Comments):   Present on Admission: Yes   Wound care consulted    Estimated body mass index is 34.8 kg/m as calculated from the following:   Height as of this encounter: 5\' 3"  (1.6 m).   Weight as of this encounter: 89.1 kg.  Code Status: full code.  DVT Prophylaxis:  SCDs Start: 05/21/23 1929   Level of Care: Level of care: Med-Surg Family Communication: none at bedside.   Disposition Plan:     Remains inpatient appropriate:  eval for hematochezia.   Procedures:  Colonoscopy.   Consultants:   Gastroenterology Wound care.   Antimicrobials:   Anti-infectives (From admission, onward)    None        Medications  Scheduled Meds:  anastrozole  1 mg Oral Daily  atorvastatin  20 mg Oral q morning   cholecalciferol  2,000 Units Oral Daily   ferrous sulfate  325 mg Oral TID WC   insulin aspart  0-9 Units Subcutaneous TID WC   losartan  100 mg Oral Daily   metFORMIN  500 mg Oral BID WC   metoprolol succinate  100 mg Oral Daily   pantoprazole  40 mg Oral Daily   polyethylene glycol  17 g Oral Daily   senna-docusate  2 tablet Oral BID   Continuous Infusions:   PRN Meds:.acetaminophen, albuterol, sorbitol    Subjective:   Kristy Harris was seen and examined today. No BM in the  last 3 days.  Constipated.   Objective:   Vitals:   05/25/23 0400 05/25/23 0949 05/25/23 0955 05/25/23 1224  BP: (!) 122/57 134/77 134/77 134/60  Pulse: 85 80 80 78  Resp: 18 18 18 18   Temp: 98.3 F (36.8 C) 98.1 F (36.7 C) 98.1 F (36.7 C) 98.2 F (36.8 C)  TempSrc: Oral Oral    SpO2: 99% 98% 98% 98%  Weight:      Height:        Intake/Output Summary (Last 24  hours) at 05/25/2023 1817 Last data filed at 05/25/2023 2956 Gross per 24 hour  Intake 118 ml  Output --  Net 118 ml   Filed Weights   05/21/23 1126  Weight: 89.1 kg     Exam General exam: Appears calm and comfortable  Respiratory system: Clear to auscultation. Respiratory effort normal. Cardiovascular system: S1 & S2 heard, RRR. No JVD, Gastrointestinal system: Abdomen is nondistended, soft and nontender. Central nervous system: Alert and oriented.  Extremities: Symmetric 5 x 5 power. Skin: No rashes,  Psychiatry: Mood & affect appropriate.       Data Reviewed:  I have personally reviewed following labs and imaging studies   CBC Lab Results  Component Value Date   WBC 11.7 (H) 05/24/2023   RBC 3.18 (L) 05/24/2023   HGB 8.0 (L) 05/25/2023   HCT 26.8 (L) 05/24/2023   MCV 84.3 05/24/2023   MCH 25.5 (L) 05/24/2023   PLT 296 05/24/2023   MCHC 30.2 05/24/2023   RDW 16.9 (H) 05/24/2023   LYMPHSABS 1.5 05/24/2023   MONOABS 0.7 05/24/2023   EOSABS 0.3 05/24/2023   BASOSABS 0.0 05/24/2023     Last metabolic panel Lab Results  Component Value Date   NA 137 05/24/2023   K 4.2 05/24/2023   CL 105 05/24/2023   CO2 25 05/24/2023   BUN 8 05/24/2023   CREATININE 0.76 05/24/2023   GLUCOSE 112 (H) 05/24/2023   GFRNONAA >60 05/24/2023   GFRAA >60 09/26/2019   CALCIUM 8.8 (L) 05/24/2023   PROT 6.7 05/22/2023   ALBUMIN 3.0 (L) 05/22/2023   LABGLOB 4.3 (H) 07/14/2022   AGRATIO 0.6 (L) 07/14/2022   BILITOT 0.4 05/22/2023   ALKPHOS 71 05/22/2023   AST 9 (L) 05/22/2023   ALT 9 05/22/2023   ANIONGAP 7 05/24/2023    CBG (last 3)  Recent Labs    05/25/23 0748 05/25/23 1221 05/25/23 1637  GLUCAP 101* 120* 125*      Coagulation Profile: No results for input(s): "INR", "PROTIME" in the last 168 hours.   Radiology Studies: No results found.     Kathlen Mody M.D. [facility] 05/25/2023, 6:17 PM  Available via Epic secure chat 7am-7pm After 7 pm,  please refer to night coverage provider listed on amion.

## 2023-05-26 DIAGNOSIS — E785 Hyperlipidemia, unspecified: Secondary | ICD-10-CM | POA: Diagnosis not present

## 2023-05-26 DIAGNOSIS — E1169 Type 2 diabetes mellitus with other specified complication: Secondary | ICD-10-CM | POA: Diagnosis not present

## 2023-05-26 DIAGNOSIS — K921 Melena: Secondary | ICD-10-CM | POA: Diagnosis not present

## 2023-05-26 DIAGNOSIS — K5731 Diverticulosis of large intestine without perforation or abscess with bleeding: Secondary | ICD-10-CM | POA: Diagnosis not present

## 2023-05-26 LAB — GLUCOSE, CAPILLARY: Glucose-Capillary: 81 mg/dL (ref 70–99)

## 2023-05-26 NOTE — Plan of Care (Signed)
  Problem: Education: Goal: Ability to describe self-care measures that may prevent or decrease complications (Diabetes Survival Skills Education) will improve Outcome: Adequate for Discharge Goal: Individualized Educational Video(s) Outcome: Adequate for Discharge   Problem: Coping: Goal: Ability to adjust to condition or change in health will improve Outcome: Adequate for Discharge   Problem: Fluid Volume: Goal: Ability to maintain a balanced intake and output will improve Outcome: Adequate for Discharge   Problem: Health Behavior/Discharge Planning: Goal: Ability to identify and utilize available resources and services will improve Outcome: Adequate for Discharge Goal: Ability to manage health-related needs will improve Outcome: Adequate for Discharge   Problem: Metabolic: Goal: Ability to maintain appropriate glucose levels will improve Outcome: Adequate for Discharge   Problem: Nutritional: Goal: Maintenance of adequate nutrition will improve Outcome: Adequate for Discharge Goal: Progress toward achieving an optimal weight will improve Outcome: Adequate for Discharge   Problem: Skin Integrity: Goal: Risk for impaired skin integrity will decrease Outcome: Adequate for Discharge   Problem: Tissue Perfusion: Goal: Adequacy of tissue perfusion will improve Outcome: Adequate for Discharge   Problem: Education: Goal: Knowledge of General Education information will improve Description: Including pain rating scale, medication(s)/side effects and non-pharmacologic comfort measures Outcome: Adequate for Discharge   Problem: Health Behavior/Discharge Planning: Goal: Ability to manage health-related needs will improve Outcome: Adequate for Discharge   Problem: Clinical Measurements: Goal: Ability to maintain clinical measurements within normal limits will improve Outcome: Adequate for Discharge Goal: Will remain free from infection Outcome: Adequate for Discharge Goal:  Diagnostic test results will improve Outcome: Adequate for Discharge Goal: Respiratory complications will improve Outcome: Adequate for Discharge Goal: Cardiovascular complication will be avoided Outcome: Adequate for Discharge   Problem: Activity: Goal: Risk for activity intolerance will decrease Outcome: Adequate for Discharge   Problem: Nutrition: Goal: Adequate nutrition will be maintained Outcome: Adequate for Discharge   Problem: Coping: Goal: Level of anxiety will decrease Outcome: Adequate for Discharge   Problem: Elimination: Goal: Will not experience complications related to bowel motility Outcome: Adequate for Discharge Goal: Will not experience complications related to urinary retention Outcome: Adequate for Discharge   Problem: Pain Managment: Goal: General experience of comfort will improve and/or be controlled Outcome: Adequate for Discharge

## 2023-05-26 NOTE — Progress Notes (Signed)
 AVS reviewed w/ patient who verbalized an understanding- no other questions - pt asked for wound care to be done prior to d/c. Primary nurse notified. PIV removed as noted. Pt dressing for d/c to home- Pt called son to pick her up from hospital - will be here in approx 40 min

## 2023-05-26 NOTE — Discharge Summary (Signed)
 Physician Discharge Summary   Patient: Kristy Harris MRN: [id number] DOB: [birth year]  Admit date:     05/21/2023  Discharge date: 05/26/2023  Discharge Physician: Kathlen Mody   PCP: Rebecka Apley, NP   Recommendations at discharge:  Please follow up with GI as recommended.  Please follow up with CBC I n one week.  Please follow up with PCP in one week.   Discharge Diagnoses: Principal Problem:   Hematochezia Active Problems:   Hypertension   Hyperlipidemia   Diabetes mellitus (HCC)   Diverticulosis of colon with hemorrhage  Resolved Problems:   * No resolved hospital problems. *  Hospital Course: Kristy Harris is a 78 y.o. female with medical history significant of chronic iron deficiency anemia, history of breast cancer, COPD, hypertension, hyperlipidemia, type 2 diabetes on oral hypoglycemics, history of diverticulosis presented to the emergency room with 2 episodes of bright red blood, per rectum.  She was admitted for evaluation of rectal bleed, suspicious for a diverticular bleed. Improving.  Hemoglobin around 7 and stable. GI on board. S/p 1 u;nit of prbc transfusion.     Assessment and Plan:  Hematochezia:  Suspect from known diverticulosis.  Underwent colonoscopy showing severe diverticulosis In the entire colon. She also had active bleeding from one of the diverticula in the sigmoid colon, underwent hemostat clips placed.  If she has recurrent bleeding will request IR and CTA of the abdomen and pelvis,.  Will continue to monitor hemoglobin.  1 unit of prbc transfusion ordered.  Advance diet as tolerated.  Hemoglobin stable around 8.  Patient wants to wait to have a BM before going home.  Senna, colace and miralax ordered. Had one bm today.      COPD; stable.  No wheezing heard.      Iron deficiency anemia:  Low iron levels .       Hypertension;  Optimal.      Type 2 DM;  Resume homemeds.    Hydradenitis suppurativa on the buttocks.   Local wound care.      RN Pressure Injury Documentation: Pressure Injury 11/02/18 Buttocks Right;Left Stage II -  Partial thickness loss of dermis presenting as a shallow open ulcer with a red, pink wound bed without slough. (Active)  11/02/18 2215  Location: Buttocks  Location Orientation: Right;Left  Staging: Stage II -  Partial thickness loss of dermis presenting as a shallow open ulcer with a red, pink wound bed without slough.  Wound Description (Comments):   Present on Admission: Yes    Wound care consulted             Consultants: gastroenterology.  Procedures performed: colonoscopy  Disposition: Home Diet recommendation:  Discharge Diet Orders (From admission, onward)     Start     Ordered   05/26/23 0000  Diet - low sodium heart healthy        05/26/23 0922           Carb modified diet DISCHARGE MEDICATION: Allergies as of 05/26/2023       Reactions   Lisinopril Swelling, Cough   Mouth swelling   Penicillins Nausea And Vomiting, Rash   .Did it involve swelling of the face/tongue/throat, SOB, or low BP? Yes Did it involve sudden or severe rash/hives, skin peeling, or any reaction on the inside of your mouth or nose? Yes Did you need to seek medical attention at a hospital or doctor's office? Yes When did it last happen?  If all above answers are "NO", may proceed with cephalosporin use.        Medication List     STOP taking these medications    glipiZIDE 10 MG 24 hr tablet Commonly known as: GLUCOTROL XL   HYDROcodone-acetaminophen 5-325 MG tablet Commonly known as: NORCO/VICODIN       TAKE these medications    acetaminophen 500 MG tablet Commonly known as: TYLENOL Take 1,000 mg by mouth every 6 (six) hours as needed for mild pain or headache.   albuterol 108 (90 Base) MCG/ACT inhaler Commonly known as: VENTOLIN HFA Inhale 2 puffs into the lungs every 4 (four) hours as needed for shortness of breath.   anastrozole 1 MG  tablet Commonly known as: ARIMIDEX TAKE 1 TABLET BY MOUTH EVERY DAY   atorvastatin 20 MG tablet Commonly known as: LIPITOR Take 20 mg by mouth every morning.   Blood Glucose Monitor System w/Device Kit With lancets and strips #100 6rf Dx: E11.9 Test bid   Calcium 150 MG Tabs Take 1 tablet by mouth daily.   cholecalciferol 1000 units tablet Commonly known as: VITAMIN D Take 2,000 Units by mouth daily.   Cosentyx UnoReady 300 MG/2ML Soaj Generic drug: Secukinumab Inject 300 mg into the skin every 14 (fourteen) days.   ferrous sulfate 325 (65 FE) MG tablet Take 325 mg by mouth 3 (three) times daily with meals.   fluconazole 150 MG tablet Commonly known as: DIFLUCAN Take 150 mg by mouth as needed.   losartan 100 MG tablet Commonly known as: COZAAR Take 100 mg by mouth daily.   metFORMIN 500 MG 24 hr tablet Commonly known as: GLUCOPHAGE-XR TAKE ONE TABLET BY MOUTH TWICE DAILY   metoprolol succinate 100 MG 24 hr tablet Commonly known as: TOPROL-XL Take 100 mg by mouth daily.   mupirocin ointment 2 % Commonly known as: BACTROBAN SMARTSIG:1 Application Topical 2-3 Times Daily   pantoprazole 40 MG tablet Commonly known as: PROTONIX TAKE 1 TABLET (40 MG TOTAL) BY MOUTH DAILY. What changed:  when to take this reasons to take this   polyethylene glycol 17 g packet Commonly known as: MIRALAX / GLYCOLAX Take 17 g by mouth daily as needed for mild constipation.   sodium chloride irrigation 0.9 % irrigation USE AS DIRECTED   sulfamethoxazole-trimethoprim 800-160 MG tablet Commonly known as: BACTRIM DS Take 1 tablet by mouth 2 (two) times daily.   verapamil 120 MG CR tablet Commonly known as: CALAN-SR Take 120 mg by mouth daily.   vitamin B-12 100 MCG tablet Commonly known as: CYANOCOBALAMIN Take 100 mcg by mouth daily.               Discharge Care Instructions  (From admission, onward)           Start     Ordered   05/26/23 0000  Discharge wound  care:       Comments: Cleanse all wounds to buttocks/groin with Vashe wound cleanser([facility] #[id number]), do not rinse and allow to air dry. Apply silver hydrofiber (Aquacel [place] #[id number]) to all wound beds daily and as needed for saturation with drainage.  Cover with ABD pads and tape or silicone foam whichever is preferred.   05/26/23 8469            Follow-up Information     Zehr, Princella Pellegrini, PA-C Follow up on 07/22/2023.   Specialty: Gastroenterology Why: 10:00AM Contact information: [address] [place] [address] [phone]  Discharge Exam: Filed Weights   05/21/23 1126  Weight: 89.1 kg   General exam: Appears calm and comfortable  Respiratory system: Clear to auscultation. Respiratory effort normal. Cardiovascular system: S1 & S2 heard, RRR. Gastrointestinal system: Abdomen is nondistended, soft and nontender.  Central nervous system: Alert and oriented. No focal neurological deficits. Extremities: Symmetric 5 x 5 power.     Condition at discharge: fair  The results of significant diagnostics from this hospitalization (including imaging, microbiology, ancillary and laboratory) are listed below for reference.   Imaging Studies: No results found.  Microbiology: Results for orders placed or performed during the hospital encounter of 11/02/18  Body fluid culture     Status: None   Collection Time: 11/02/18  1:04 PM   Specimen: BLOOD LEFT WRIST; Body Fluid  Result Value Ref Range Status   Specimen Description   Final    BLOOD LEFT WRIST Performed at [facility], [address], [place], [place] [address]    Special Requests   Final    Normal Performed at [facility], [address] [place], [place] [address]    Gram Stain   Final    RARE WBC PRESENT, PREDOMINANTLY PMN NO ORGANISMS SEEN    Culture   Final    NO GROWTH 3 DAYS Performed at [facility], [address] [place], [place] [address]    Report Status 11/06/2018  FINAL  Final  SARS Coronavirus 2 [facility] order, Performed in [facility] hospital lab) Nasopharyngeal Nasopharyngeal Swab     Status: None   Collection Time: 11/02/18  3:48 PM   Specimen: Nasopharyngeal Swab  Result Value Ref Range Status   SARS Coronavirus 2 NEGATIVE NEGATIVE Final    Comment: (NOTE) If result is NEGATIVE SARS-CoV-2 target nucleic acids are NOT DETECTED. The SARS-CoV-2 RNA is generally detectable in upper and lower  respiratory specimens during the acute phase of infection. The lowest  concentration of SARS-CoV-2 viral copies this assay can detect is 250  copies / mL. A negative result does not preclude SARS-CoV-2 infection  and should not be used as the sole basis for treatment or other  patient management decisions.  A negative result may occur with  improper specimen collection / handling, submission of specimen other  than nasopharyngeal swab, presence of viral mutation(s) within the  areas targeted by this assay, and inadequate number of viral copies  (<250 copies / mL). A negative result must be combined with clinical  observations, patient history, and epidemiological information. If result is POSITIVE SARS-CoV-2 target nucleic acids are DETECTED. The SARS-CoV-2 RNA is generally detectable in upper and lower  respiratory specimens dur ing the acute phase of infection.  Positive  results are indicative of active infection with SARS-CoV-2.  Clinical  correlation with patient history and other diagnostic information is  necessary to determine patient infection status.  Positive results do  not rule out bacterial infection or co-infection with other viruses. If result is PRESUMPTIVE POSTIVE SARS-CoV-2 nucleic acids MAY BE PRESENT.   A presumptive positive result was obtained on the submitted specimen  and confirmed on repeat testing.  While 2019 novel coronavirus  (SARS-CoV-2) nucleic acids may be present in the submitted sample  additional confirmatory testing  may be necessary for epidemiological  and / or clinical management purposes  to differentiate between  SARS-CoV-2 and other Sarbecovirus currently known to infect humans.  If clinically indicated additional testing with an alternate test  methodology [phone]) is advised. The SARS-CoV-2 RNA  is generally  detectable in upper and lower respiratory sp ecimens during the acute  phase of infection. The expected result is Negative. Fact Sheet for Patients:  BoilerBrush.com.cy Fact Sheet for Healthcare Providers: https://pope.com/ This test is not yet approved or cleared by the [place] FDA and has been authorized for detection and/or diagnosis of SARS-CoV-2 by FDA under an Emergency Use Authorization (EUA).  This EUA will remain in effect (meaning this test can be used) for the duration of the COVID-19 declaration under Section 564(b)(1) of the Act, 21 U.S.C. section 360bbb-3(b)(1), unless the authorization is terminated or revoked sooner. Performed at [facility], [address] [place], [place] [address]     Labs: CBC: Recent Labs  Lab 05/21/23 1200 05/21/23 1634 05/21/23 1640 05/22/23 0403 05/22/23 1124 05/23/23 0149 05/23/23 1124 05/23/23 1150 05/23/23 2317 05/24/23 0437 05/24/23 1425 05/25/23 0011 05/25/23 1234  WBC 11.7*  --   --   --  8.9  --  9.5  --   --  11.7*  --   --   --   NEUTROABS 9.4*  --   --   --  6.7  --  7.4  --   --  9.1*  --   --   --   HGB 8.6*   < > 8.8*   < > 7.7*   < > 7.7*   < > 8.4* 8.1* 8.1* 8.2* 8.0*  HCT 29.2*  --  26.0*  --  25.7*  --  25.9*  --   --  26.8*  --   --   --   MCV 86.4  --   --   --  84.5  --  85.5  --   --  84.3  --   --   --   PLT 371  --   --   --  310  --  315  --   --  296  --   --   --    < > = values in this interval not displayed.   Basic Metabolic Panel: Recent Labs  Lab 05/21/23 1200 05/21/23 1640 05/22/23 0403 05/24/23 0437  NA 135 135 137 137  K 5.2* 4.5 4.0  4.2  CL 102 103 106 105  CO2 21*  --  25 25  GLUCOSE 109* 126* 99 112*  BUN 20 19 17 8   CREATININE 0.82 0.90 0.81 0.76  CALCIUM 8.9  --  8.6* 8.8*   Liver Function Tests: Recent Labs  Lab 05/21/23 1200 05/22/23 0403  AST 13* 9*  ALT 9 9  ALKPHOS 77 71  BILITOT 0.9 0.4  PROT 7.6 6.7  ALBUMIN 3.2* 3.0*   CBG: Recent Labs  Lab 05/25/23 1221 05/25/23 1637 05/25/23 1923 05/25/23 2121 05/26/23 0750  GLUCAP 120* 125* 111* 117* 81    Discharge time spent: 36 min  Signed: Kathlen Mody, MD [facility] 05/26/2023

## 2023-07-22 ENCOUNTER — Other Ambulatory Visit (INDEPENDENT_AMBULATORY_CARE_PROVIDER_SITE_OTHER)

## 2023-07-22 ENCOUNTER — Telehealth: Payer: Self-pay

## 2023-07-22 ENCOUNTER — Ambulatory Visit: Payer: Self-pay | Admitting: Gastroenterology

## 2023-07-22 ENCOUNTER — Ambulatory Visit (INDEPENDENT_AMBULATORY_CARE_PROVIDER_SITE_OTHER): Admitting: Gastroenterology

## 2023-07-22 ENCOUNTER — Encounter: Payer: Self-pay | Admitting: Gastroenterology

## 2023-07-22 VITALS — BP 110/60 | HR 67 | Ht 63.5 in | Wt 194.0 lb

## 2023-07-22 DIAGNOSIS — D62 Acute posthemorrhagic anemia: Secondary | ICD-10-CM | POA: Diagnosis not present

## 2023-07-22 DIAGNOSIS — Z8719 Personal history of other diseases of the digestive system: Secondary | ICD-10-CM

## 2023-07-22 DIAGNOSIS — D638 Anemia in other chronic diseases classified elsewhere: Secondary | ICD-10-CM | POA: Diagnosis not present

## 2023-07-22 LAB — CBC
HCT: 25.3 % — ABNORMAL LOW (ref 36.0–46.0)
Hemoglobin: 8 g/dL — CL (ref 12.0–15.0)
MCHC: 31.6 g/dL (ref 30.0–36.0)
MCV: 80 fl (ref 78.0–100.0)
Platelets: 331 10*3/uL (ref 150.0–400.0)
RBC: 3.16 Mil/uL — ABNORMAL LOW (ref 3.87–5.11)
RDW: 17.4 % — ABNORMAL HIGH (ref 11.5–15.5)
WBC: 8.2 10*3/uL (ref 4.0–10.5)

## 2023-07-22 NOTE — Telephone Encounter (Signed)
 Critical value Hgb 8.0 Report will be pushed into EPIC soon.

## 2023-07-22 NOTE — Patient Instructions (Signed)
 Your provider has requested that you go to the basement level for lab work before leaving today. Press "B" on the elevator. The lab is located at the first door on the left as you exit the elevator.   Due to recent changes in healthcare laws, you may see the results of your imaging and laboratory studies on MyChart before your provider has had a chance to review them.  We understand that in some cases there may be results that are confusing or concerning to you. Not all laboratory results come back in the same time frame and the provider may be waiting for multiple results in order to interpret others.  Please give us  48 hours in order for your provider to thoroughly review all the results before contacting the office for clarification of your results.   _______________________________________________________  If your blood pressure at your visit was 140/90 or greater, please contact your primary care physician to follow up on this.  _______________________________________________________  If you are age 76 or older, your body mass index should be between 23-30. Your Body mass index is 33.83 kg/m. If this is out of the aforementioned range listed, please consider follow up with your Primary Care Provider.  If you are age 75 or younger, your body mass index should be between 19-25. Your Body mass index is 33.83 kg/m. If this is out of the aformentioned range listed, please consider follow up with your Primary Care Provider.   ________________________________________________________  The [facility] providers would like to encourage you to use MYCHART to communicate with providers for non-urgent requests or questions.  Due to long hold times on the telephone, sending your provider a message by [facility] may be a faster and more efficient way to get a response.  Please allow 48 business hours for a response.  Please remember that this is for non-urgent requests.   _______________________________________________________  Thank you for choosing me and [facility].

## 2023-07-22 NOTE — Progress Notes (Addendum)
 07/22/2023 ERABELLA KUIPERS [id number] 03-02-1946   HISTORY OF PRESENT ILLNESS:  This is a 78 y.o. female with a past medical history as listed below including breast cancer, chronic anemia, COPD, hidradenitis, hypertension, moderate AS, and multiple others who presented to the ER in March with complaint of rectal bleeding/diverticular bleed.  Here for follow-up from hospital stay.    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  Anemia noted from chronic inflammation from hidradenitis flareups of mild CKD.  Hgb tends to run between 8.5-9.5 grams.  Last checked a few weeks ago was 8.5 grams.  She maintains on ferrous sulfate  325 mg BID per her oncologist.  Iron makes her stool dark greenish.  No further sign of significant bleeding, has just minimal amounts from her hidradenitis.  Colonoscopy 05/2023:  - Preparation of the colon was fair. - Changes of hidradenitis suppurative found on perianal exam. - Severe diverticulosis in the sigmoid colon. There was active bleeding coming from one proximal sigmouid diverticular opening. The bleeding stopped and I had to reloacte the vbleeding diverticulum - found clot inside and am reasonably but not 100% confident this was the culprit diverticulum. Clips ( MR conditional) were placed. Clip manufacturer: [employer]. - Severe diverticulosis in the entire examined colon. - Non- bleeding internal hemorrhoids. - The examination was otherwise normal on direct and retroflexion views. - No specimens collected.  Previously Dr. Arvie Latus patient, now Dr. Willy Harvest patient.   Past Medical History:  Diagnosis Date   Anemia    takes iron supplement   Arthritis    knees   Breast cancer (HCC) 2016   COPD (chronic obstructive pulmonary disease) (HCC)     Enlarged heart    GERD (gastroesophageal reflux disease)    Hidradenitis suppurativa    buttocks   History of breast cancer 12/2014   Hyperlipidemia    Hypertension    states under control with meds., has been on med. > 40 yrs.   Morbid obesity (HCC)    Non-insulin  dependent type 2 diabetes mellitus (HCC)    Personal history of chemotherapy    Personal history of radiation therapy    Shortness of breath dyspnea    with exertion   Urinary frequency    Past Surgical History:  Procedure Laterality Date   ABDOMINAL HYSTERECTOMY  25 yrs ago   partial   BLADDER SUSPENSION     x 2   BREAST LUMPECTOMY Left 12/25/2014   BREAST LUMPECTOMY WITH RADIOACTIVE SEED AND SENTINEL LYMPH NODE BIOPSY Left 12/25/2014   Procedure: RADIOACTIVE SEED GIUDED LEFT BREAST LUMPECTOMY, LEFT AXILLARY SENTINEL LYMPH NODE BIOPSY;  Surgeon: Juanita Norlander, MD;  Location: [facility];  Service: General;  Laterality: Left;   CARDIAC CATHETERIZATION  02/21/2011   Normal coronary arteries, normal EF   COLONOSCOPY N/A 05/23/2023   Procedure: COLONOSCOPY;  Surgeon: Kenney Peacemaker, MD;  Location: [facility];  Service: Gastroenterology;  Laterality: N/A;   COLONOSCOPY WITH PROPOFOL   11/18/2013   ESOPHAGOGASTRODUODENOSCOPY (EGD) WITH PROPOFOL   11/18/2013   EXCISION HYDRADENITIS LABIA N/A 12/08/2013   Procedure: EXCISION HIDRADENITIS PUBIC AREA;  Surgeon: Juanita Norlander, MD;  Location: [facility];  Service: General;  Laterality: N/A;   HEMOSTASIS CLIP PLACEMENT  05/23/2023   Procedure: CONTROL OF HEMORRHAGE, GI TRACT, ENDOSCOPIC, BY CLIPPING OR OVERSEWING;  Surgeon: Willy Harvest,  Jerrye Mori, MD;  Location: [facility];  Service: Gastroenterology;;   HYDRADENITIS EXCISION Left 12/08/2013   Procedure: EXCISION HIDRADENITIS AXILLA;  Surgeon: Juanita Norlander, MD;  Location: [facility];  Service: General;  Laterality: Left;   INCISION AND DRAINAGE ABSCESS  08/17/2008   perineum and buttock   IRRIGATION AND DEBRIDEMENT ABSCESS Right 12/23/2012    Procedure: incision  AND DEBRIDEMENT right buttock infection ;  Surgeon: Thayne Fine, MD;  Location: [facility];  Service: General;  Laterality: Right;   PILONIDAL CYST / SINUS EXCISION  06/04/2004   PILONIDAL CYST EXCISION     PORT-A-CATH REMOVAL Right 03/21/2016   Procedure: MINOR REMOVAL PORT-A-CATH;  Surgeon: Juanita Norlander, MD;  [facility];  Service: General;  Laterality: Right;  MINOR REMOVAL PORT-A-CATH   PORTACATH PLACEMENT N/A 01/12/2015   Procedure: INSERTION PORT-A-CATH;  Surgeon: Juanita Norlander, MD;  Location: [facility];  Service: General;  Laterality: N/A;   TONSILLECTOMY      reports that she quit smoking about 29 years ago. Her smoking use included cigarettes. She started smoking about 29 years ago. She has never used smokeless tobacco. She reports that she does not drink alcohol and does not use drugs. family history includes Breast cancer in her cousin, maternal aunt, maternal grandmother, and sister; Breast cancer (age of onset: 60) in her sister; Dementia in her maternal aunt; Diabetes in her sister; Heart attack in her mother; Hypertension in her brother, daughter, and son; Lung cancer in her brother; Lung cancer (age of onset: 38) in her father; Prostate cancer in her brother; Throat cancer in her brother. Allergies  Allergen Reactions   Lisinopril  Swelling and Cough    Mouth swelling   Penicillins Nausea And Vomiting and Rash    .Did it involve swelling of the face/tongue/throat, SOB, or low BP? Yes Did it involve sudden or severe rash/hives, skin peeling, or any reaction on the inside of your mouth or nose? Yes Did you need to seek medical attention at a hospital or doctor's office? Yes When did it last happen?       If all above answers are "NO", may proceed with cephalosporin use.       Outpatient Encounter Medications as of 07/22/2023  Medication Sig   acetaminophen  (TYLENOL ) 500 MG tablet Take 1,000 mg by mouth every 6 (six) hours as needed for  mild pain or headache.   albuterol  (PROVENTIL  HFA;VENTOLIN  HFA) 108 (90 BASE) MCG/ACT inhaler Inhale 2 puffs into the lungs every 4 (four) hours as needed for shortness of breath.   anastrozole  (ARIMIDEX ) 1 MG tablet TAKE 1 TABLET BY MOUTH EVERY DAY   atorvastatin  (LIPITOR) 20 MG tablet Take 20 mg by mouth every morning.   Blood Glucose Monitoring Suppl (BLOOD GLUCOSE MONITOR SYSTEM) w/Device KIT With lancets and strips #100 6rf Dx: E11.9 Test bid   Calcium  150 MG TABS Take 1 tablet by mouth daily.    cholecalciferol  (VITAMIN D ) 1000 units tablet Take 2,000 Units by mouth daily.   COSENTYX UNOREADY 300 MG/2ML SOAJ Inject 300 mg into the skin every 14 (fourteen) days.   ferrous sulfate  325 (65 FE) MG tablet Take 325 mg by mouth 3 (three) times daily with meals.   fluconazole (DIFLUCAN) 150 MG tablet Take 150 mg by mouth as needed.    losartan  (COZAAR ) 100 MG tablet Take 100 mg by mouth daily.   metFORMIN  (GLUCOPHAGE -XR) 500 MG 24 hr tablet TAKE ONE TABLET BY MOUTH TWICE DAILY   metoprolol  succinate (  TOPROL -XL) 100 MG 24 hr tablet Take 100 mg by mouth daily.   mupirocin  ointment (BACTROBAN ) 2 % SMARTSIG:1 Application Topical 2-3 Times Daily   pantoprazole  (PROTONIX ) 40 MG tablet TAKE 1 TABLET (40 MG TOTAL) BY MOUTH DAILY. (Patient taking differently: Take 40 mg by mouth as needed.)   polyethylene glycol (MIRALAX  / GLYCOLAX ) packet Take 17 g by mouth daily as needed for mild constipation.   sodium chloride  irrigation 0.9 % irrigation USE AS DIRECTED   sulfamethoxazole -trimethoprim  (BACTRIM  DS) 800-160 MG tablet Take 1 tablet by mouth 2 (two) times daily.   verapamil  (CALAN -SR) 120 MG CR tablet Take 120 mg by mouth daily.   vitamin B-12 (CYANOCOBALAMIN ) 100 MCG tablet Take 100 mcg by mouth daily.   No facility-administered encounter medications on file as of 07/22/2023.     REVIEW OF SYSTEMS  : All other systems reviewed and negative except where noted in the History of Present  Illness.   PHYSICAL EXAM: BP 110/60   Pulse 67   Ht 5' 3.5" (1.613 m)   Wt 194 lb (88 kg)   BMI 33.83 kg/m  General: Well developed female in no acute distress Head: Normocephalic and atraumatic Eyes:  Sclerae anicteric, conjunctiva pink. Ears: Normal auditory acuity Lungs: Clear throughout to auscultation Heart: Regular rate and rhythm; SEM noted. Musculoskeletal: Symmetrical with no gross deformities  Skin: No lesions on visible extremities Neurological: Alert oriented x 4, grossly non-focal Psychological:  Alert and cooperative. Normal mood and affect  ASSESSMENT AND PLAN: Diverticulosis w/ hemorrhage s/p clipping of bleeding sigmoid diverticulum 05/23/23.   Decreased Hgb/Acute blood loss anemia + chronic disease anemia   No further bleeding since 3/22 - Hgb improved after 1U RBC.  Repeat Hgb last month was 8.5 grams but it appears she lives in the 8.5-9.5 gram range   Repeat CBC today.  Colonoscopy prep was fair so would be appropriate but not essential to consider repeat exam w/in 1 year.  Will have her follow-up in a year to discuss.   CC:  Hemberg, Mariah Shines, NP

## 2023-08-13 ENCOUNTER — Inpatient Hospital Stay: Attending: Hematology

## 2023-08-13 DIAGNOSIS — Z170 Estrogen receptor positive status [ER+]: Secondary | ICD-10-CM | POA: Diagnosis not present

## 2023-08-13 DIAGNOSIS — Z1731 Human epidermal growth factor receptor 2 positive status: Secondary | ICD-10-CM | POA: Diagnosis not present

## 2023-08-13 DIAGNOSIS — D649 Anemia, unspecified: Secondary | ICD-10-CM

## 2023-08-13 DIAGNOSIS — M8580 Other specified disorders of bone density and structure, unspecified site: Secondary | ICD-10-CM | POA: Insufficient documentation

## 2023-08-13 DIAGNOSIS — Z1721 Progesterone receptor positive status: Secondary | ICD-10-CM | POA: Diagnosis not present

## 2023-08-13 DIAGNOSIS — D509 Iron deficiency anemia, unspecified: Secondary | ICD-10-CM | POA: Insufficient documentation

## 2023-08-13 DIAGNOSIS — C50412 Malignant neoplasm of upper-outer quadrant of left female breast: Secondary | ICD-10-CM | POA: Insufficient documentation

## 2023-08-13 DIAGNOSIS — C773 Secondary and unspecified malignant neoplasm of axilla and upper limb lymph nodes: Secondary | ICD-10-CM | POA: Insufficient documentation

## 2023-08-13 DIAGNOSIS — D508 Other iron deficiency anemias: Secondary | ICD-10-CM

## 2023-08-13 LAB — CBC WITH DIFFERENTIAL/PLATELET
Abs Immature Granulocytes: 0.03 10*3/uL (ref 0.00–0.07)
Basophils Absolute: 0 10*3/uL (ref 0.0–0.1)
Basophils Relative: 0 %
Eosinophils Absolute: 0.2 10*3/uL (ref 0.0–0.5)
Eosinophils Relative: 3 %
HCT: 25.3 % — ABNORMAL LOW (ref 36.0–46.0)
Hemoglobin: 7.7 g/dL — ABNORMAL LOW (ref 12.0–15.0)
Immature Granulocytes: 0 %
Lymphocytes Relative: 20 %
Lymphs Abs: 1.6 10*3/uL (ref 0.7–4.0)
MCH: 26.2 pg (ref 26.0–34.0)
MCHC: 30.4 g/dL (ref 30.0–36.0)
MCV: 86.1 fL (ref 80.0–100.0)
Monocytes Absolute: 0.5 10*3/uL (ref 0.1–1.0)
Monocytes Relative: 6 %
Neutro Abs: 5.9 10*3/uL (ref 1.7–7.7)
Neutrophils Relative %: 71 %
Platelets: 329 10*3/uL (ref 150–400)
RBC: 2.94 MIL/uL — ABNORMAL LOW (ref 3.87–5.11)
RDW: 16 % — ABNORMAL HIGH (ref 11.5–15.5)
WBC: 8.3 10*3/uL (ref 4.0–10.5)
nRBC: 0 % (ref 0.0–0.2)

## 2023-08-13 LAB — COMPREHENSIVE METABOLIC PANEL WITH GFR
ALT: 13 U/L (ref 0–44)
AST: 14 U/L — ABNORMAL LOW (ref 15–41)
Albumin: 2.9 g/dL — ABNORMAL LOW (ref 3.5–5.0)
Alkaline Phosphatase: 81 U/L (ref 38–126)
Anion gap: 8 (ref 5–15)
BUN: 19 mg/dL (ref 8–23)
CO2: 26 mmol/L (ref 22–32)
Calcium: 9 mg/dL (ref 8.9–10.3)
Chloride: 105 mmol/L (ref 98–111)
Creatinine, Ser: 1.06 mg/dL — ABNORMAL HIGH (ref 0.44–1.00)
GFR, Estimated: 55 mL/min — ABNORMAL LOW (ref >60)
Glucose, Bld: 120 mg/dL — ABNORMAL HIGH (ref 70–99)
Potassium: 4.4 mmol/L (ref 3.5–5.1)
Sodium: 139 mmol/L (ref 135–145)
Total Bilirubin: 0.3 mg/dL (ref 0.0–1.2)
Total Protein: 7.1 g/dL (ref 6.5–8.1)

## 2023-08-13 LAB — IRON AND TIBC
Iron: 14 ug/dL — ABNORMAL LOW (ref 28–170)
Saturation Ratios: 8 % — ABNORMAL LOW (ref 10.4–31.8)
TIBC: 184 ug/dL — ABNORMAL LOW (ref 250–450)
UIBC: 170 ug/dL

## 2023-08-13 LAB — FERRITIN: Ferritin: 441 ng/mL — ABNORMAL HIGH (ref 11–307)

## 2023-08-19 NOTE — Progress Notes (Deleted)
 [facility] [address], [place] [address]  Clinic Day:  08/19/2023  Referring physician: Fonda Hymen, NP  Patient Care Team: Fonda Hymen, NP as PCP - General (Adult Health Nurse Practitioner) Lasalle Pointer, MD as PCP - Cardiology (Cardiology) Eduardo Grade, MD as Consulting Physician (Dermatology) Eilleen Grates, MD as Consulting Physician (Cardiology) Darlis Eisenmenger, MD as Consulting Physician (Cardiology) Johna Myers, MD as Consulting Physician (Radiation Oncology) Juanita Norlander, MD as Consulting Physician (General Surgery) Everette Hives, Marge Shed, MD (Inactive) as Consulting Physician (Hematology and Oncology)   ASSESSMENT & PLAN:   Assessment: 1. 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  She then underwent XRT by Dr. Jeryl Moris (05/08/2015- 06/22/2015) with continuation of Herceptin  x 52 weeks finishing on 02/07/2016. -Anastrozole  started in May 2017, tolerating it very well.  Hot flashes minor or stable.   2.  Osteopenia: - DEXA scan on 06/12/2017 shows T score of -2.3 in the femoral neck.  Prior to that DEXA scan 2017 showed T score of -0.1. -Prolia  started on September 18, 2017. -DEXA scan on December 20, 2019 with T score of -0.9.   3.  Normocytic anemia: -She is taking iron supplements.  Plan: 1. Stage IIA (T1cN1) invasive ductal carcinoma of left breast, ER+/PR+/HER2+, with 1/1 sentinel lymph node for metastatic disease:  - She is tolerating anastrozole  except for occasional hot flashes. - She is continuing anastrozole  beyond 5 years. - Mammogram on 12/29/2022 was BI-RADS Category 1.  Next due in October 2025.   2.  Osteopenia: - Last Prolia  shot on 02/20/2023. -Repeat Prolia  in June 2025. -Calcium  level is WNL. -Continue calcium  and vitamin D  supplements.  3.   Normocytic anemia: - Anemia from chronic inflammation from hidradenitis flareups and mild CKD. - Denies any bleeding per rectum or melena.  Has hidradenitis flareups. -She received 1 unit of blood a few months ago (August 2024).  - She is taking 2 iron tablets daily. 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  - Labs from 05/07/2023 show fairly unremarkable CMP with only mildly elevated glucose levels and low albumin levels.  CBC shows hemoglobin of 8.8 with hematocrit of 29.7.  White blood cell count is normal.  Differential is normal. -She has had anemia ranging from as low as 7.2-normal with baseline between 8 and 9 since 2010. -EPO level was elevated at 28 likely indicating CKD.  Most recent creatinine was 0.96 with BUN of 16. -Discussed that her last visit possible bone marrow biopsy but she declined.  Reports she has had a low hemoglobin most of her life and would like to continue oral iron tablets.  Given hemoglobin has improved, would recommend continuing iron at this time.  Will hold off on EPO stimulating agent such as Retacrit or Procrit .  PLAN SUMMARY: >> Prolia  in June 2025.  >> Continue to iron tablets daily. >> Return to clinic in 3 months for Prolia  and lab work possible Retacrit.    I spent 25 minutes dedicated to the care of this patient (face-to-face and non-face-to-face) on the date of the encounter to include what is described in the assessment and plan.   No orders of the defined types were placed in this encounter.  [name], NP   6/18/202510:16 AM  CHIEF COMPLAINT:   Diagnosis: left breast cancer    Cancer Staging  Breast cancer of upper-outer quadrant of left female breast [facility]) Staging form: Breast, AJCC 7th Edition - Clinical stage from 02/08/2015: Stage IIA (T1c, N1, M0) - Signed by Doretta Gant, PA-C on 02/08/2015    Prior Therapy: 1. Left lumpectomy 12/25/14 2. Adjuvant chemotherapy with Abraxane /Herceptin  (02/01/2015 - 04/19/2015)  3. XRT by Dr. Jeryl Moris (05/08/2015- 06/22/2015)  4. Herceptin , completed 02/07/16  Current Therapy:  anastrozole     HISTORY OF PRESENT ILLNESS:   Oncology History  Breast cancer of upper-outer quadrant of left female breast (HCC)  11/01/2014 Mammogram   Possible mass in the left breast upper outer quadrant measuring 13 mm suspicious for breast cancer confirmed through ultrasound a spiculated hypoechoic mass ill-defined, no enlarged lymph nodes   11/01/2014 Initial Diagnosis   Invasive ductal carcinoma, moderately differentiated, 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 The estimated ejection   fraction was in the range of 55% to 60%.    12/25/2014 Surgery   Left lumpectomy: IDC 1.7 cm, positive for LVI, with DCIS, 1/1 sentinel node positive deposit 1.9 cm with extracapsular extension, ER 90%, PR 90%, HER-2 positive ratio 2.4, Ki-67 29% T1 cN1 stage II a   02/01/2015 - 04/19/2015 Chemotherapy   Abraxane /Herceptin    03/20/2015 Echocardiogram   Systolic function was normal. The estimated ejection fraction was in the range of 60%  to 65%.   05/08/2015 - 06/22/2015 Radiation Therapy   Dr. Jeryl Moris    05/10/2015 - 02/07/2016 Antibody Plan   Herceptin  every 21 days to complete 52 weeks worth of therapy.   06/13/2015 Echocardiogram   2D echo- The estimated ejection fraction was in the range of 60% to 65%. Diastolic function is abnormal, indeterminate grade. Wall motion was normal.   06/28/2015 Imaging   Bone density- BMD as determined from Femur Total Left is 0.994 g/cm2 with a T-Score of -0.1. This patient is considered normal according to World Health Organization [facility]) criteria.    07/13/2015 -  Anti-estrogen oral therapy   Arimidex  daily    09/12/2015 Echocardiogram   The estimated ejection fraction was in the range of 60% to 65%. Wall motion was normal; there were no regional wall motion abnormalities. Doppler parameters are consistent with abnormal L ventricular relaxation (grade 1 diastolic dysfunction).   12/04/2015 Echocardiogram   Left ventricle: The cavity size was normal. Wall thickness was   increased increased in a pattern of mild to moderate LVH.   Systolic function was normal. The estimated ejection fraction was   in the range of 60% to 65%. Wall motion was normal; there were no   regional wall motion abnormalities. Features are consistent with   a pseudonormal left ventricular filling pattern, with concomitant   abnormal relaxation and increased filling pressure (grade 2   diastolic dysfunction). Doppler parameters are consistent with   high ventricular filling pressure.   03/21/2016 Procedure   Port removed by Dr. Odean Bend      INTERVAL HISTORY:   Kristy Harris is a 78 y.o. female presenting to clinic today for follow up of left breast cancer. She was last seen in clinic on 02/05/23.  Since her last visit, she denies any hospitalizations, surgeries or changes to her baseline health.  She denies any bleeding, melena or  hematochezia.  Appetite has been 100% energy levels are 75%.  She continues to have shortness of breath with exertion.  She continues to have knees and back pain and uses a lift chair at home.  Uses a rolling walker and cane as needed for ambulation.  Will likely need surgery but is not interested at this time.  Appetite has improved.   She was seen by cardiology on 04/03/2023 for follow-up for aortic valve stenosis, hypertension and elevated cholesterol levels.  No medication changes were made.  She appears to be stable from cardiology standpoint.  PAST MEDICAL HISTORY:   Past Medical History: Past Medical History:  Diagnosis Date   Anemia    takes iron supplement   Arthritis    knees   Breast cancer  (HCC) 2016   COPD (chronic obstructive pulmonary disease) (HCC)    Enlarged heart    GERD (gastroesophageal reflux disease)    Hidradenitis suppurativa    buttocks   History of breast cancer 12/2014   Hyperlipidemia    Hypertension    states under control with meds., has been on med. > 40 yrs.   Morbid obesity (HCC)    Non-insulin  dependent type 2 diabetes mellitus (HCC)    Personal history of chemotherapy    Personal history of radiation therapy    Shortness of breath dyspnea    with exertion   Urinary frequency     Surgical History: Past Surgical History:  Procedure Laterality Date   ABDOMINAL HYSTERECTOMY  25 yrs ago   partial   BLADDER SUSPENSION     x 2   BREAST LUMPECTOMY Left 12/25/2014   BREAST LUMPECTOMY WITH RADIOACTIVE SEED AND SENTINEL LYMPH NODE BIOPSY Left 12/25/2014   Procedure: RADIOACTIVE SEED GIUDED LEFT BREAST LUMPECTOMY, LEFT AXILLARY SENTINEL LYMPH NODE BIOPSY;  Surgeon: Juanita Norlander, MD;  Location: [facility];  Service: General;  Laterality: Left;   CARDIAC CATHETERIZATION  02/21/2011   Normal coronary arteries, normal EF   COLONOSCOPY N/A 05/23/2023   Procedure: COLONOSCOPY;  Surgeon: Kenney Peacemaker, MD;  Location: [facility];  Service: Gastroenterology;  Laterality: N/A;   COLONOSCOPY WITH PROPOFOL   11/18/2013   ESOPHAGOGASTRODUODENOSCOPY (EGD) WITH PROPOFOL   11/18/2013   EXCISION HYDRADENITIS LABIA N/A 12/08/2013   Procedure: EXCISION HIDRADENITIS PUBIC AREA;  Surgeon: Juanita Norlander, MD;  Location: [facility];  Service: General;  Laterality: N/A;   HEMOSTASIS CLIP PLACEMENT  05/23/2023   Procedure: CONTROL OF HEMORRHAGE, GI TRACT, ENDOSCOPIC, BY CLIPPING OR OVERSEWING;  Surgeon: Kenney Peacemaker, MD;  Location: [facility];  Service: Gastroenterology;;   HYDRADENITIS EXCISION Left 12/08/2013   Procedure: EXCISION HIDRADENITIS AXILLA;  Surgeon: Juanita Norlander, MD;  Location: [facility];  Service: General;  Laterality: Left;   INCISION AND DRAINAGE ABSCESS  08/17/2008    perineum and buttock   IRRIGATION AND DEBRIDEMENT ABSCESS Right 12/23/2012   Procedure: incision  AND DEBRIDEMENT right buttock infection ;  Surgeon: Thayne Fine, MD;  Location: [facility];  Service: General;  Laterality: Right;   PILONIDAL CYST / SINUS EXCISION  06/04/2004   PILONIDAL CYST EXCISION     PORT-A-CATH REMOVAL Right 03/21/2016   Procedure: MINOR REMOVAL PORT-A-CATH;  Surgeon: Juanita Norlander, MD;  Location: [facility];  Service: General;  Laterality: Right;  MINOR REMOVAL PORT-A-CATH   PORTACATH PLACEMENT N/A 01/12/2015   Procedure: INSERTION PORT-A-CATH;  Surgeon: Juanita Norlander, MD;  Location: [facility];  Service: General;  Laterality: N/A;   TONSILLECTOMY      Social History:  Social History   Socioeconomic History   Marital status: Widowed    Spouse name: Not on file   Number of children: 5   Years of education: Not on file   Highest education level: Not on file  Occupational History   Occupation: Textile work    Comment: Disabled  Tobacco Use   Smoking status: Former    Current packs/day: 0.00    Types: Cigarettes    Start date: 03/03/1994    Quit date: 03/03/1994    Years since quitting: 29.4   Smokeless tobacco: Never  Vaping Use   Vaping status: Never Used  Substance and Sexual Activity   Alcohol use: No   Drug use: No   Sexual activity: Not Currently    Partners: Male  Other Topics Concern   Not on file  Social History Narrative   Lives with grandson.  Widowed.  Has 2 sons and 3 daughters   Social Drivers of Corporate investment banker Strain: Medium Risk (06/19/2023)   Received from [facility]   Overall Financial Resource Strain (CARDIA)    Difficulty of Paying Living Expenses: Somewhat hard  Food Insecurity: No Food Insecurity (06/19/2023)   [facility]   Hunger Vital Sign    Within the past 12 months, you worried that your food would run out before you got the money to buy more.: Never true    Within the past 12  months, the food you bought just didn't last and you didn't have money to get more.: Never true  Transportation Needs: No Transportation Needs (06/19/2023)   Received from [facility] - Transportation    Lack of Transportation (Medical): No    Lack of Transportation (Non-Medical): No  Physical Activity: Unknown (02/04/2023)   Received from [facility]   Exercise Vital Sign    On average, how many days per week do you engage in moderate to strenuous exercise (like a brisk walk)?: 0 days    Minutes of Exercise per Session: Not on file  Stress: No Stress Concern Present (02/04/2023)   Received from [facility] of Occupational Health - Occupational Stress Questionnaire    Feeling of Stress : Not at all  Social Connections: Moderately Isolated (05/23/2023)   Social Connection and Isolation Panel    Frequency of Communication with Friends and Family: More than three times a week    Frequency of Social Gatherings with Friends and Family: More than three times a week    Attends Religious Services: More than 4 times per year    Active Member of [employer] or Organizations: No    Attends Banker Meetings: Never    Marital Status: Widowed  Intimate Partner Violence: Not At Risk (05/21/2023)   Humiliation, Afraid, Rape, and Kick questionnaire    Fear of Current or Ex-Partner: No    Emotionally Abused: No    Physically Abused: No    Sexually Abused: No    Family History: Family History  Problem Relation Age of Onset   Heart attack Mother        had multiple health problems   Lung cancer Father 54       smoker   Prostate cancer Brother    Lung cancer Brother        dx. 68s; smoker   Throat cancer Brother        dx. 30s; smoker   Diabetes Sister  has 3 living sisters with multiple health problems   Hypertension Son    Hypertension Daughter    Breast cancer Sister 49   Breast cancer Sister        dx. 40s   Breast cancer Maternal  Grandmother        dx. 70s   Breast cancer Maternal Aunt        dx. older than 50   Dementia Maternal Aunt    Breast cancer Cousin        dx. 50s   Hypertension Brother     Current Medications:  Current Outpatient Medications:    acetaminophen  (TYLENOL ) 500 MG tablet, Take 1,000 mg by mouth every 6 (six) hours as needed for mild pain or headache., Disp: , Rfl:    albuterol  (PROVENTIL  HFA;VENTOLIN  HFA) 108 (90 BASE) MCG/ACT inhaler, Inhale 2 puffs into the lungs every 4 (four) hours as needed for shortness of breath., Disp: , Rfl:    anastrozole  (ARIMIDEX ) 1 MG tablet, TAKE 1 TABLET BY MOUTH EVERY DAY, Disp: 90 tablet, Rfl: 3   atorvastatin  (LIPITOR) 20 MG tablet, Take 20 mg by mouth every morning., Disp: , Rfl:    Blood Glucose Monitoring Suppl (BLOOD GLUCOSE MONITOR SYSTEM) w/Device KIT, With lancets and strips #100 [id number] Dx: E11.9 Test bid, Disp: , Rfl:    Calcium  150 MG TABS, Take 1 tablet by mouth daily. , Disp: , Rfl:    cholecalciferol  (VITAMIN D ) 1000 units tablet, Take 2,000 Units by mouth daily., Disp: , Rfl:    COSENTYX UNOREADY 300 MG/2ML SOAJ, Inject 300 mg into the skin every 14 (fourteen) days., Disp: , Rfl:    ferrous sulfate  325 (65 FE) MG tablet, Take 325 mg by mouth 3 (three) times daily with meals., Disp: , Rfl:    fluconazole (DIFLUCAN) 150 MG tablet, Take 150 mg by mouth as needed. , Disp: , Rfl:    losartan  (COZAAR ) 100 MG tablet, Take 100 mg by mouth daily., Disp: , Rfl:    metFORMIN  (GLUCOPHAGE -XR) 500 MG 24 hr tablet, TAKE ONE TABLET BY MOUTH TWICE DAILY, Disp: , Rfl:    metoprolol  succinate (TOPROL -XL) 100 MG 24 hr tablet, Take 100 mg by mouth daily., Disp: , Rfl:    mupirocin  ointment (BACTROBAN ) 2 %, SMARTSIG:1 Application Topical 2-3 Times Daily, Disp: , Rfl:    pantoprazole  (PROTONIX ) 40 MG tablet, TAKE 1 TABLET (40 MG TOTAL) BY MOUTH DAILY. (Patient taking differently: Take 40 mg by mouth as needed.), Disp: 90 tablet, Rfl: 0   polyethylene glycol (MIRALAX  /  GLYCOLAX ) packet, Take 17 g by mouth daily as needed for mild constipation., Disp: 14 each, Rfl: 0   sodium chloride  irrigation 0.9 % irrigation, USE AS DIRECTED, Disp: , Rfl:    sulfamethoxazole -trimethoprim  (BACTRIM  DS) 800-160 MG tablet, Take 1 tablet by mouth 2 (two) times daily., Disp: , Rfl:    verapamil  (CALAN -SR) 120 MG CR tablet, Take 120 mg by mouth daily., Disp: , Rfl:    vitamin B-12 (CYANOCOBALAMIN ) 100 MCG tablet, Take 100 mcg by mouth daily., Disp: , Rfl:    Allergies: Allergies  Allergen Reactions   Lisinopril  Swelling and Cough    Mouth swelling   Penicillins Nausea And Vomiting and Rash    .Did it involve swelling of the face/tongue/throat, SOB, or low BP? Yes Did it involve sudden or severe rash/hives, skin peeling, or any reaction on the inside of your mouth or nose? Yes Did you need to seek medical attention at a hospital  or doctor's office? Yes When did it last happen?       If all above answers are "NO", may proceed with cephalosporin use.     REVIEW OF SYSTEMS:   Review of Systems  Constitutional:  Positive for fatigue.  Respiratory:  Positive for shortness of breath.   Cardiovascular:  Positive for palpitations.  Musculoskeletal:  Positive for arthralgias.  Neurological:  Positive for dizziness.  Psychiatric/Behavioral:  Positive for sleep disturbance.      VITALS:   There were no vitals taken for this visit.  Wt Readings from Last 3 Encounters:  07/22/23 194 lb (88 kg)  05/21/23 196 lb 6.9 oz (89.1 kg)  05/14/23 196 lb 6.9 oz (89.1 kg)    There is no height or weight on file to calculate BMI.  Performance status (ECOG): 1 - Symptomatic but completely ambulatory  PHYSICAL EXAM:   Physical Exam Constitutional:      Appearance: Normal appearance.   Cardiovascular:     Rate and Rhythm: Normal rate and regular rhythm.  Pulmonary:     Effort: Pulmonary effort is normal.     Breath sounds: Normal breath sounds.  Abdominal:     General: Bowel  sounds are normal.     Palpations: Abdomen is soft.   Musculoskeletal:        General: No swelling. Normal range of motion.   Neurological:     Mental Status: She is alert and oriented to person, place, and time. Mental status is at baseline.     LABS:      Latest Ref Rng & Units 08/13/2023    2:29 PM 07/22/2023   10:46 AM 05/25/2023   12:34 PM  CBC  WBC 4.0 - 10.5 K/uL 8.3  8.2    Hemoglobin 12.0 - 15.0 g/dL 7.7  8.0 aL  8.0   Hematocrit 36.0 - 46.0 % 25.3  25.3 aL    Platelets 150 - 400 K/uL 329  331.0        Latest Ref Rng & Units 08/13/2023    2:29 PM 05/24/2023    4:37 AM 05/22/2023    4:03 AM  CMP  Glucose 70 - 99 mg/dL 409  811  99   BUN 8 - 23 mg/dL 19  8  17    Creatinine 0.44 - 1.00 mg/dL 9.14  7.82  9.56   Sodium 135 - 145 mmol/L 139  137  137   Potassium 3.5 - 5.1 mmol/L 4.4  4.2  4.0   Chloride 98 - 111 mmol/L 105  105  106   CO2 22 - 32 mmol/L 26  25  25    Calcium  8.9 - 10.3 mg/dL 9.0  8.8  8.6   Total Protein 6.5 - 8.1 g/dL 7.1   6.7   Total Bilirubin 0.0 - 1.2 mg/dL 0.3   0.4   Alkaline Phos 38 - 126 U/L 81   71   AST 15 - 41 U/L 14   9   ALT 0 - 44 U/L 13   9      No results found for: CEA1, CEA / No results found for: CEA1, CEA No results found for: PSA1 No results found for: OZH086 No results found for: VHQ469  Lab Results  Component Value Date   TOTALPROTELP 6.9 07/14/2022   TOTALPROTELP 6.9 07/14/2022   ALBUMINELP 2.6 (L) 07/14/2022   A1GS 0.5 (H) 07/14/2022   A2GS 1.0 07/14/2022   BETS 1.0 07/14/2022   GAMS 1.8 07/14/2022  MSPIKE Not Observed 07/14/2022   SPEI Comment 07/14/2022   Lab Results  Component Value Date   TIBC 184 (L) 08/13/2023   TIBC 191 (L) 05/07/2023   TIBC 207 (L) 04/03/2023   FERRITIN 441 (H) 08/13/2023   FERRITIN 505 (H) 05/07/2023   FERRITIN 803 (H) 01/20/2023   IRONPCTSAT 8 (L) 08/13/2023   IRONPCTSAT 11 05/07/2023   IRONPCTSAT 12 04/03/2023   Lab Results  Component Value Date   LDH 83 (L)  01/20/2023   LDH 114 12/20/2019   LDH 122 07/19/2019     STUDIES:   No results found.

## 2023-08-20 ENCOUNTER — Inpatient Hospital Stay

## 2023-08-20 ENCOUNTER — Inpatient Hospital Stay: Admitting: Oncology

## 2023-08-20 ENCOUNTER — Inpatient Hospital Stay (HOSPITAL_BASED_OUTPATIENT_CLINIC_OR_DEPARTMENT_OTHER): Admitting: Oncology

## 2023-08-20 VITALS — BP 137/46 | HR 71 | Temp 98.9°F | Resp 18 | Ht 63.5 in | Wt 200.0 lb

## 2023-08-20 DIAGNOSIS — D649 Anemia, unspecified: Secondary | ICD-10-CM

## 2023-08-20 DIAGNOSIS — C50412 Malignant neoplasm of upper-outer quadrant of left female breast: Secondary | ICD-10-CM | POA: Diagnosis not present

## 2023-08-20 DIAGNOSIS — M8580 Other specified disorders of bone density and structure, unspecified site: Secondary | ICD-10-CM | POA: Diagnosis not present

## 2023-08-20 DIAGNOSIS — D508 Other iron deficiency anemias: Secondary | ICD-10-CM | POA: Diagnosis not present

## 2023-08-20 DIAGNOSIS — M85852 Other specified disorders of bone density and structure, left thigh: Secondary | ICD-10-CM

## 2023-08-20 MED ORDER — DENOSUMAB 60 MG/ML ~~LOC~~ SOSY
60.0000 mg | PREFILLED_SYRINGE | Freq: Once | SUBCUTANEOUS | Status: AC
  Administered 2023-08-20: 60 mg via SUBCUTANEOUS

## 2023-08-20 NOTE — Progress Notes (Unsigned)
 [facility] [address], [place] [address]  Clinic Day:  08/20/2023  Referring physician: Fonda Hymen, NP  Patient Care Team: Fonda Hymen, NP as PCP - General (Adult Health Nurse Practitioner) Lasalle Pointer, MD as PCP - Cardiology (Cardiology) Eduardo Grade, MD as Consulting Physician (Dermatology) Eilleen Grates, MD as Consulting Physician (Cardiology) Darlis Eisenmenger, MD as Consulting Physician (Cardiology) Johna Myers, MD as Consulting Physician (Radiation Oncology) Juanita Norlander, MD as Consulting Physician (General Surgery) Everette Hives, Marge Shed, MD (Inactive) as Consulting Physician (Hematology and Oncology)   ASSESSMENT & PLAN:   Assessment: 1. 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  She then underwent XRT by Dr. Jeryl Moris (05/08/2015- 06/22/2015) with continuation of Herceptin  x 52 weeks finishing on 02/07/2016. -Anastrozole  started in May 2017, tolerating it very well.  Hot flashes minor or stable.   2.  Osteopenia: - DEXA scan on 06/12/2017 shows T score of -2.3 in the femoral neck.  Prior to that DEXA scan 2017 showed T score of -0.1. -Prolia  started on September 18, 2017. -DEXA scan on December 20, 2019 with T score of -0.9.   3.  Normocytic anemia: -She is taking iron supplements.  Plan: 1. Stage IIA (T1cN1) invasive ductal carcinoma of left breast, ER+/PR+/HER2+, with 1/1 sentinel lymph node for metastatic disease:  - She is tolerating anastrozole  except for occasional hot flashes. - She is continuing anastrozole  beyond 5 years. - Mammogram on 12/29/2022 was BI-RADS Category 1.  Next due in October 2025.  Orders placed.   2.  Osteopenia: - Last Prolia  shot on 02/20/2023. - She is due for her Prolia  injection today. -Calcium  level is WNL. -Continue calcium  and  vitamin D  supplements.  3.  Normocytic anemia: - Anemia from chronic inflammation from hidradenitis flareups and mild CKD. - Denies any bleeding per rectum or melena.  Has hidradenitis flareups. -She received 1 unit of blood a few months ago (August 2024).  - She is taking 2 iron tablets daily. 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  - Labs from 08/13/23 show an elevated creatinine at 1.06 and low albumin at 2.9.  GFR 55.  CBC showed hemoglobin of 7.7 (8.0) with unremarkable differential.  Iron levels show iron saturations of 8% with TIBC of 184.  Ferritin 441 show fairly unremarkable CMP with only mildly elevated glucose levels and low albumin levels.  CBC shows hemoglobin of 8.8 with hematocrit of 29.7.  White blood cell count is normal.  Differential is normal. -She has had anemia ranging from as low as 7.2-normal with baseline between 8 and 9 since 2010. -EPO level was elevated at 28 likely indicating CKD.  Most recent creatinine was 0.96 with BUN of 16. -Discussed that her last visit possible bone marrow biopsy but she declined.  Reports she has had a low hemoglobin most of her life and would like to continue oral iron tablets.  Given hemoglobin has improved, would recommend continuing iron at this time.  Will hold off on EPO stimulating agent such as Retacrit or Procrit .  PLAN SUMMARY: >> Prolia  in June 2025.  >> Continue to iron tablets daily. >> Return to clinic in 3 months for Prolia  and lab work possible Retacrit.    I spent 25 minutes dedicated to  the care of this patient (face-to-face and non-face-to-face) on the date of the encounter to include what is described in the assessment and plan.   No orders of the defined types were placed in this encounter.   [name], NP   6/19/202510:05 AM  CHIEF COMPLAINT:   Diagnosis: left breast cancer     Cancer Staging  Breast cancer of upper-outer quadrant of left female breast [facility]) Staging form: Breast, AJCC 7th Edition - Clinical stage from 02/08/2015: Stage IIA (T1c, N1, M0) - Signed by Doretta Gant, PA-C on 02/08/2015    Prior Therapy: 1. Left lumpectomy 12/25/14 2. Adjuvant chemotherapy with Abraxane /Herceptin  (02/01/2015 - 04/19/2015)  3. XRT by Dr. Jeryl Moris (05/08/2015- 06/22/2015)  4. Herceptin , completed 02/07/16  Current Therapy:  anastrozole     HISTORY OF PRESENT ILLNESS:   Oncology History  Breast cancer of upper-outer quadrant of left female breast (HCC)  11/01/2014 Mammogram   Possible mass in the left breast upper outer quadrant measuring 13 mm suspicious for breast cancer confirmed through ultrasound a spiculated hypoechoic mass ill-defined, no enlarged lymph nodes   11/01/2014 Initial Diagnosis   Invasive ductal carcinoma, moderately differentiated, 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 The estimated ejection   fraction was in the range of 55% to 60%.    12/25/2014 Surgery   Left lumpectomy: IDC 1.7 cm, positive for LVI, with DCIS, 1/1 sentinel node positive deposit 1.9 cm with extracapsular extension, ER 90%, PR 90%, HER-2 positive ratio 2.4, Ki-67 29% T1 cN1 stage II a   02/01/2015 - 04/19/2015 Chemotherapy   Abraxane /Herceptin    03/20/2015 Echocardiogram   Systolic function was normal. The estimated ejection fraction was in the range of 60%  to 65%.   05/08/2015 - 06/22/2015 Radiation Therapy   Dr. Jeryl Moris    05/10/2015 - 02/07/2016 Antibody Plan   Herceptin  every 21 days to complete 52 weeks worth of therapy.   06/13/2015 Echocardiogram   2D echo- The estimated ejection fraction was in the range of 60% to 65%. Diastolic function is abnormal, indeterminate grade. Wall motion was normal.   06/28/2015 Imaging   Bone density- BMD as determined from Femur  Total Left is 0.994 g/cm2 with a T-Score of -0.1. This patient is considered normal according to World Health Organization [facility]) criteria.    07/13/2015 -  Anti-estrogen oral therapy   Arimidex  daily   09/12/2015 Echocardiogram   The estimated ejection fraction was in the range of 60% to 65%. Wall motion was normal; there were no regional wall motion abnormalities. Doppler parameters are consistent with abnormal L ventricular relaxation (grade 1 diastolic dysfunction).   12/04/2015 Echocardiogram   Left ventricle: The cavity size was normal. Wall thickness was   increased increased in a pattern of mild to moderate LVH.   Systolic function was normal. The estimated ejection fraction was   in the range of 60% to 65%. Wall motion was normal; there were no   regional wall motion abnormalities. Features are consistent with   a pseudonormal left ventricular filling pattern, with concomitant   abnormal relaxation and increased filling pressure (grade 2   diastolic dysfunction). Doppler parameters are consistent with   high ventricular filling pressure.   03/21/2016 Procedure   Port removed by Dr. Odean Bend      INTERVAL HISTORY:   Kristy Harris is a 78 y.o. female presenting to clinic  today for follow up of left breast cancer. She was last seen in clinic on 02/05/23.  Since her last visit, she denies any hospitalizations, surgeries or changes to her baseline health.  She denies any bleeding, melena or hematochezia.  Appetite has been 100% energy levels are 75%.  She continues to have shortness of breath with exertion.  She continues to have knees and back pain and uses a lift chair at home.  Uses a rolling walker and cane as needed for ambulation.  Will likely need surgery but is not interested at this time.  Appetite has improved.   Received 1 unti of blood during hospital stay.   She was seen by cardiology on 04/03/2023 for follow-up for aortic valve stenosis, hypertension and elevated cholesterol levels.   No medication changes were made.  She appears to be stable from cardiology standpoint.  PAST MEDICAL HISTORY:   Past Medical History: Past Medical History:  Diagnosis Date   Anemia    takes iron supplement   Arthritis    knees   Breast cancer (HCC) 2016   COPD (chronic obstructive pulmonary disease) (HCC)    Enlarged heart    GERD (gastroesophageal reflux disease)    Hidradenitis suppurativa    buttocks   History of breast cancer 12/2014   Hyperlipidemia    Hypertension    states under control with meds., has been on med. > 40 yrs.   Morbid obesity (HCC)    Non-insulin  dependent type 2 diabetes mellitus (HCC)    Personal history of chemotherapy    Personal history of radiation therapy    Shortness of breath dyspnea    with exertion   Urinary frequency     Surgical History: Past Surgical History:  Procedure Laterality Date   ABDOMINAL HYSTERECTOMY  25 yrs ago   partial   BLADDER SUSPENSION     x 2   BREAST LUMPECTOMY Left 12/25/2014   BREAST LUMPECTOMY WITH RADIOACTIVE SEED AND SENTINEL LYMPH NODE BIOPSY Left 12/25/2014   Procedure: RADIOACTIVE SEED GIUDED LEFT BREAST LUMPECTOMY, LEFT AXILLARY SENTINEL LYMPH NODE BIOPSY;  Surgeon: Juanita Norlander, MD;  Location: [facility] OR;  Service: General;  Laterality: Left;   CARDIAC CATHETERIZATION  02/21/2011   Normal coronary arteries, normal EF   COLONOSCOPY N/A 05/23/2023   Procedure: COLONOSCOPY;  Surgeon: Kenney Peacemaker, MD;  Location: [facility];  Service: Gastroenterology;  Laterality: N/A;   COLONOSCOPY WITH PROPOFOL   11/18/2013   ESOPHAGOGASTRODUODENOSCOPY (EGD) WITH PROPOFOL   11/18/2013   EXCISION HYDRADENITIS LABIA N/A 12/08/2013   Procedure: EXCISION HIDRADENITIS PUBIC AREA;  Surgeon: Juanita Norlander, MD;  Location: [facility];  Service: General;  Laterality: N/A;   HEMOSTASIS CLIP PLACEMENT  05/23/2023   Procedure: CONTROL OF HEMORRHAGE, GI TRACT, ENDOSCOPIC, BY CLIPPING OR OVERSEWING;  Surgeon: Kenney Peacemaker, MD;  Location: [facility];  Service: Gastroenterology;;   HYDRADENITIS EXCISION Left 12/08/2013   Procedure: EXCISION HIDRADENITIS AXILLA;  Surgeon: Juanita Norlander, MD;  Location: [facility];  Service: General;  Laterality: Left;   INCISION AND DRAINAGE ABSCESS  08/17/2008   perineum and buttock   IRRIGATION AND DEBRIDEMENT ABSCESS Right 12/23/2012   Procedure: incision  AND DEBRIDEMENT right buttock infection ;  Surgeon: Thayne Fine, MD;  Location: [facility];  Service: General;  Laterality: Right;   PILONIDAL CYST / SINUS EXCISION  06/04/2004   PILONIDAL CYST EXCISION     PORT-A-CATH REMOVAL Right 03/21/2016   Procedure: MINOR REMOVAL PORT-A-CATH;  Surgeon: Juanita Norlander, MD;  Location: [facility];  Service: General;  Laterality: Right;  MINOR REMOVAL PORT-A-CATH   PORTACATH PLACEMENT N/A 01/12/2015   Procedure: INSERTION PORT-A-CATH;  Surgeon: Juanita Norlander, MD;  Location: [facility];  Service: General;  Laterality: N/A;   TONSILLECTOMY      Social History: Social History   Socioeconomic History   Marital status: Widowed    Spouse name: Not on file   Number of children: 5   Years of education: Not on file   Highest education level: Not on file  Occupational History   Occupation: Textile work    Comment: Disabled  Tobacco Use   Smoking status: Former    Current packs/day: 0.00    Types: Cigarettes    Start date: 03/03/1994    Quit date: 03/03/1994    Years since quitting: 29.4   Smokeless tobacco: Never  Vaping Use   Vaping status: Never Used  Substance and Sexual Activity   Alcohol use: No   Drug use: No   Sexual activity: Not Currently    Partners: Male  Other Topics Concern   Not on file  Social History Narrative   Lives with grandson.  Widowed.  Has 2 sons and 3 daughters   Social Drivers of Corporate investment banker Strain: Medium Risk (06/19/2023)   Received from [facility]   Overall Financial Resource Strain (CARDIA)    Difficulty of Paying Living Expenses: Somewhat  hard  Food Insecurity: No Food Insecurity (06/19/2023)   Received from [facility]   Hunger Vital Sign    Within the past 12 months, you worried that your food would run out before you got the money to buy more.: Never true    Within the past 12 months, the food you bought just didn't last and you didn't have money to get more.: Never true  Transportation Needs: No Transportation Needs (06/19/2023)   [facility]    Lack of Transportation (Medical): No    Lack of Transportation (Non-Medical): No  Physical Activity: Unknown (02/04/2023)   Received from [facility]   Exercise Vital Sign    On average, how many days per week do you engage in moderate to strenuous exercise (like a brisk walk)?: 0 days    Minutes of Exercise per Session: Not on file  Stress: No Stress Concern Present (02/04/2023)   Received from [facility] Occupational [facility] - Occupational Stress Questionnaire    Feeling of Stress : Not at all  Social Connections: Moderately Isolated (05/23/2023)   Social Connection and Isolation Panel    Frequency of Communication with Friends and Family: More than three times a week    Frequency of Social Gatherings with Friends and Family: More than three times a week    Attends Religious Services: More than 4 times per year    Active Member of [employer] or Organizations: No    Attends Banker Meetings: Never    Marital Status: Widowed  Intimate Partner Violence: Not At Risk (05/21/2023)   Humiliation, Afraid, Rape, and Kick questionnaire    Fear of Current or Ex-Partner: No    Emotionally Abused: No    Physically Abused: No    Sexually Abused: No    Family History: Family History  Problem Relation Age of Onset   Heart attack Mother        had multiple health problems   Lung cancer Father 58  smoker   Prostate cancer Brother    Lung cancer Brother        dx. 101s; smoker   Throat cancer Brother         dx. 51s; smoker   Diabetes Sister        has 3 living sisters with multiple health problems   Hypertension Son    Hypertension Daughter    Breast cancer Sister 16   Breast cancer Sister        dx. 40s   Breast cancer Maternal Grandmother        dx. 70s   Breast cancer Maternal Aunt        dx. older than 50   Dementia Maternal Aunt    Breast cancer Cousin        dx. 50s   Hypertension Brother     Current Medications:  Current Outpatient Medications:    acetaminophen  (TYLENOL ) 500 MG tablet, Take 1,000 mg by mouth every 6 (six) hours as needed for mild pain or headache., Disp: , Rfl:    albuterol  (PROVENTIL  HFA;VENTOLIN  HFA) 108 (90 BASE) MCG/ACT inhaler, Inhale 2 puffs into the lungs every 4 (four) hours as needed for shortness of breath., Disp: , Rfl:    anastrozole  (ARIMIDEX ) 1 MG tablet, TAKE 1 TABLET BY MOUTH EVERY DAY, Disp: 90 tablet, Rfl: 3   atorvastatin  (LIPITOR) 20 MG tablet, Take 20 mg by mouth every morning., Disp: , Rfl:    Blood Glucose Monitoring Suppl (BLOOD GLUCOSE MONITOR SYSTEM) w/Device KIT, With lancets and strips #100 6rf Dx: E11.9 Test bid, Disp: , Rfl:    Calcium  150 MG TABS, Take 1 tablet by mouth daily. , Disp: , Rfl:    cholecalciferol  (VITAMIN D ) 1000 units tablet, Take 2,000 Units by mouth daily., Disp: , Rfl:    COSENTYX UNOREADY 300 MG/2ML SOAJ, Inject 300 mg into the skin every 14 (fourteen) days., Disp: , Rfl:    ferrous sulfate  325 (65 FE) MG tablet, Take 325 mg by mouth 3 (three) times daily with meals., Disp: , Rfl:    fluconazole (DIFLUCAN) 150 MG tablet, Take 150 mg by mouth as needed. , Disp: , Rfl:    losartan  (COZAAR ) 100 MG tablet, Take 100 mg by mouth daily., Disp: , Rfl:    metFORMIN  (GLUCOPHAGE -XR) 500 MG 24 hr tablet, TAKE ONE TABLET BY MOUTH TWICE DAILY, Disp: , Rfl:    metoprolol  succinate (TOPROL -XL) 100 MG 24 hr tablet, Take 100 mg by mouth daily., Disp: , Rfl:    mupirocin  ointment (BACTROBAN ) 2 %, SMARTSIG:1 Application  Topical 2-3 Times Daily, Disp: , Rfl:    pantoprazole  (PROTONIX ) 40 MG tablet, TAKE 1 TABLET (40 MG TOTAL) BY MOUTH DAILY. (Patient taking differently: Take 40 mg by mouth as needed.), Disp: 90 tablet, Rfl: 0   polyethylene glycol (MIRALAX  / GLYCOLAX ) packet, Take 17 g by mouth daily as needed for mild constipation., Disp: 14 each, Rfl: 0   sodium chloride  irrigation 0.9 % irrigation, USE AS DIRECTED, Disp: , Rfl:    sulfamethoxazole -trimethoprim  (BACTRIM  DS) 800-160 MG tablet, Take 1 tablet by mouth 2 (two) times daily., Disp: , Rfl:    verapamil  (CALAN -SR) 120 MG CR tablet, Take 120 mg by mouth daily., Disp: , Rfl:    vitamin B-12 (CYANOCOBALAMIN ) 100 MCG tablet, Take 100 mcg by mouth daily., Disp: , Rfl:    Allergies: Allergies  Allergen Reactions   Lisinopril  Swelling and Cough    Mouth swelling   Penicillins Nausea And Vomiting  and Rash    .Did it involve swelling of the face/tongue/throat, SOB, or low BP? Yes Did it involve sudden or severe rash/hives, skin peeling, or any reaction on the inside of your mouth or nose? Yes Did you need to seek medical attention at a hospital or doctor's office? Yes When did it last happen?       If all above answers are "NO", may proceed with cephalosporin use.     REVIEW OF SYSTEMS:   Review of Systems  Constitutional:  Positive for fatigue.  Respiratory:  Positive for shortness of breath.   Cardiovascular:  Positive for leg swelling.  Genitourinary:  Positive for nocturia.   Musculoskeletal:  Positive for back pain.  Neurological:  Positive for dizziness.  Psychiatric/Behavioral:  Positive for sleep disturbance.      VITALS:   There were no vitals taken for this visit.  Wt Readings from Last 3 Encounters:  07/22/23 194 lb (88 kg)  05/21/23 196 lb 6.9 oz (89.1 kg)  05/14/23 196 lb 6.9 oz (89.1 kg)    There is no height or weight on file to calculate BMI.  Performance status (ECOG): 1 - Symptomatic but completely  ambulatory  PHYSICAL EXAM:   Physical Exam Constitutional:      Appearance: Normal appearance.   Cardiovascular:     Rate and Rhythm: Normal rate and regular rhythm.  Pulmonary:     Effort: Pulmonary effort is normal.     Breath sounds: Normal breath sounds.  Abdominal:     General: Bowel sounds are normal.     Palpations: Abdomen is soft.   Musculoskeletal:        General: No swelling. Normal range of motion.   Neurological:     Mental Status: She is alert and oriented to person, place, and time. Mental status is at baseline.     LABS:      Latest Ref Rng & Units 08/13/2023    2:29 PM 07/22/2023   10:46 AM 05/25/2023   12:34 PM  CBC  WBC 4.0 - 10.5 K/uL 8.3  8.2    Hemoglobin 12.0 - 15.0 g/dL 7.7  8.0 aL  8.0   Hematocrit 36.0 - 46.0 % 25.3  25.3 aL    Platelets 150 - 400 K/uL 329  331.0        Latest Ref Rng & Units 08/13/2023    2:29 PM 05/24/2023    4:37 AM 05/22/2023    4:03 AM  CMP  Glucose 70 - 99 mg/dL 161  096  99   BUN 8 - 23 mg/dL 19  8  17    Creatinine 0.44 - 1.00 mg/dL 0.45  4.09  8.11   Sodium 135 - 145 mmol/L 139  137  137   Potassium 3.5 - 5.1 mmol/L 4.4  4.2  4.0   Chloride 98 - 111 mmol/L 105  105  106   CO2 22 - 32 mmol/L 26  25  25    Calcium  8.9 - 10.3 mg/dL 9.0  8.8  8.6   Total Protein 6.5 - 8.1 g/dL 7.1   6.7   Total Bilirubin 0.0 - 1.2 mg/dL 0.3   0.4   Alkaline Phos 38 - 126 U/L 81   71   AST 15 - 41 U/L 14   9   ALT 0 - 44 U/L 13   9      No results found for: CEA1, CEA / No results found for: CEA1, CEA No results found  for: PSA1 No results found for: CAN199 No results found for: UVO536  Lab Results  Component Value Date   TOTALPROTELP 6.9 07/14/2022   TOTALPROTELP 6.9 07/14/2022   ALBUMINELP 2.6 (L) 07/14/2022   A1GS 0.5 (H) 07/14/2022   A2GS 1.0 07/14/2022   BETS 1.0 07/14/2022   GAMS 1.8 07/14/2022   MSPIKE Not Observed 07/14/2022   SPEI Comment 07/14/2022   Lab Results  Component Value Date   TIBC 184  (L) 08/13/2023   TIBC 191 (L) 05/07/2023   TIBC 207 (L) 04/03/2023   FERRITIN 441 (H) 08/13/2023   FERRITIN 505 (H) 05/07/2023   FERRITIN 803 (H) 01/20/2023   IRONPCTSAT 8 (L) 08/13/2023   IRONPCTSAT 11 05/07/2023   IRONPCTSAT 12 04/03/2023   Lab Results  Component Value Date   LDH 83 (L) 01/20/2023   LDH 114 12/20/2019   LDH 122 07/19/2019     STUDIES:   No results found.

## 2023-08-20 NOTE — Progress Notes (Signed)
 Alfrieda Antes presents today for injection per the provider's orders.  Prlolia  administration without incident; injection site WNL; see MAR for injection details.  Patient tolerated procedure well and without incident.  No questions or complaints noted at this time.   Patient denies any tooth or jaw pain and no recent or future major dental appointments at this time. Patient reports taking Calcium /Vit D supplements at this time.  Discharged from clinic via wheelchair in stable condition. Alert and oriented x 3. F/U with [facility] as scheduled.

## 2023-08-20 NOTE — Patient Instructions (Signed)
 [facility] [place] - A DEPT OF [facility]  Discharge Instructions: Thank you for choosing [facility] to provide your oncology and hematology care.  If you have a lab appointment with the Cancer Center - please note that after April 8th, 2024, all labs will be drawn in the cancer center.  You do not have to check in or register with the main entrance as you have in the past but will complete your check-in in the cancer center.  Wear comfortable clothing and clothing appropriate for easy access to any Portacath or PICC line.   We strive to give you quality time with your provider. You may need to reschedule your appointment if you arrive late (15 or more minutes).  Arriving late affects you and other patients whose appointments are after yours.  Also, if you miss three or more appointments without notifying the office, you may be dismissed from the clinic at the provider's discretion.      For prescription refill requests, have your pharmacy contact our office and allow 72 hours for refills to be completed.    Today you received Prolia injection     BELOW ARE SYMPTOMS THAT SHOULD BE REPORTED IMMEDIATELY: *FEVER GREATER THAN 100.4 F (38 C) OR HIGHER *CHILLS OR SWEATING *NAUSEA AND VOMITING THAT IS NOT CONTROLLED WITH YOUR NAUSEA MEDICATION *UNUSUAL SHORTNESS OF BREATH *UNUSUAL BRUISING OR BLEEDING *URINARY PROBLEMS (pain or burning when urinating, or frequent urination) *BOWEL PROBLEMS (unusual diarrhea, constipation, pain near the anus) TENDERNESS IN MOUTH AND THROAT WITH OR WITHOUT PRESENCE OF ULCERS (sore throat, sores in mouth, or a toothache) UNUSUAL RASH, SWELLING OR PAIN  UNUSUAL VAGINAL DISCHARGE OR ITCHING   Items with * indicate a potential emergency and should be followed up as soon as possible or go to the Emergency Department if any problems should occur.  Please show the CHEMOTHERAPY ALERT CARD or IMMUNOTHERAPY ALERT CARD at check-in  to the Emergency Department and triage nurse.  Should you have questions after your visit or need to cancel or reschedule your appointment, please contact [facility] [place] - A DEPT OF [facility] [phone]  and follow the prompts.  Office hours are 8:00 a.m. to 4:30 p.m. Monday - Friday. Please note that voicemails left after 4:00 p.m. may not be returned until the following business day.  We are closed weekends and major holidays. You have access to a nurse at all times for urgent questions. Please call the main number to the clinic [phone] and follow the prompts.  For any non-urgent questions, you may also contact your provider using MyChart. We now offer e-Visits for anyone 20 and older to request care online for non-urgent symptoms. For details visit [email or web address].   Also download the MyChart app! Go to the app store, search "MyChart", open the app, select [place], and log in with your MyChart username and password.

## 2023-08-26 ENCOUNTER — Encounter (HOSPITAL_COMMUNITY): Payer: Self-pay | Admitting: Hematology

## 2023-08-28 ENCOUNTER — Inpatient Hospital Stay

## 2023-08-28 ENCOUNTER — Encounter (HOSPITAL_COMMUNITY): Payer: Self-pay | Admitting: Specialist

## 2023-08-28 VITALS — BP 129/53 | HR 62 | Temp 97.5°F | Resp 18

## 2023-08-28 DIAGNOSIS — M85852 Other specified disorders of bone density and structure, left thigh: Secondary | ICD-10-CM

## 2023-08-28 DIAGNOSIS — M8580 Other specified disorders of bone density and structure, unspecified site: Secondary | ICD-10-CM | POA: Diagnosis not present

## 2023-08-28 DIAGNOSIS — D508 Other iron deficiency anemias: Secondary | ICD-10-CM

## 2023-08-28 MED ORDER — SODIUM CHLORIDE 0.9 % IV SOLN
510.0000 mg | Freq: Once | INTRAVENOUS | Status: AC
  Administered 2023-08-28: 510 mg via INTRAVENOUS
  Filled 2023-08-28: qty 510

## 2023-08-28 MED ORDER — SODIUM CHLORIDE 0.9 % IV SOLN: Freq: Once | INTRAVENOUS | Status: AC

## 2023-08-28 NOTE — Progress Notes (Signed)
 Patient presents today for Feraheme  infusion. Vital signs stable. Patient last received iron in 2020. Patient states no side effects or reactions with last iron.   Feraheme  given today per MD orders. Tolerated infusion without adverse affects. Vital signs stable. No complaints at this time. Discharged from clinic by wheel chair in stable condition. Alert and oriented x 3. F/U with [facility] as scheduled.

## 2023-08-28 NOTE — Patient Instructions (Signed)
 [facility] [place] - A DEPT OF [facility]  Discharge Instructions: Thank you for choosing [facility] to provide your oncology and hematology care.  If you have a lab appointment with the Cancer Center - please note that after April 8th, 2024, all labs will be drawn in the cancer center.  You do not have to check in or register with the main entrance as you have in the past but will complete your check-in in the cancer center.  Wear comfortable clothing and clothing appropriate for easy access to any Portacath or PICC line.   We strive to give you quality time with your provider. You may need to reschedule your appointment if you arrive late (15 or more minutes).  Arriving late affects you and other patients whose appointments are after yours.  Also, if you miss three or more appointments without notifying the office, you may be dismissed from the clinic at the provider's discretion.      For prescription refill requests, have your pharmacy contact our office and allow 72 hours for refills to be completed.    Today you received the following chemotherapy and/or immunotherapy agents Feraheme. Ferumoxytol Injection What is this medication? FERUMOXYTOL (FER ue MOX i tol) treats low levels of iron in your body (iron deficiency anemia). Iron is a mineral that plays an important role in making red blood cells, which carry oxygen from your lungs to the rest of your body. This medicine may be used for other purposes; ask your health care provider or pharmacist if you have questions. COMMON BRAND NAME(S): Feraheme What should I tell my care team before I take this medication? They need to know if you have any of these conditions: Anemia not caused by low iron levels High levels of iron in the blood Magnetic resonance imaging (MRI) test scheduled An unusual or allergic reaction to iron, other medications, foods, dyes, or preservatives Pregnant or trying to get  pregnant Breastfeeding How should I use this medication? This medication is injected into a vein. It is given by your care team in a hospital or clinic setting. Talk to your care team the use of this medication in children. Special care may be needed. Overdosage: If you think you have taken too much of this medicine contact a poison control center or emergency room at once. NOTE: This medicine is only for you. Do not share this medicine with others. What if I miss a dose? It is important not to miss your dose. Call your care team if you are unable to keep an appointment. What may interact with this medication? Other iron products This list may not describe all possible interactions. Give your health care provider a list of all the medicines, herbs, non-prescription drugs, or dietary supplements you use. Also tell them if you smoke, drink alcohol, or use illegal drugs. Some items may interact with your medicine. What should I watch for while using this medication? Visit your care team for regular checks on your progress. Tell your care team if your symptoms do not start to get better or if they get worse. You may need blood work done while you are taking this medication. You may need to eat more foods that contain iron. Talk to your care team. Foods that contain iron include whole grains or cereals, dried fruits, beans, peas, leafy green vegetables, and organ meats (liver, kidney). What side effects may I notice from receiving this medication? Side effects that  you should report to your care team as soon as possible: Allergic reactions--skin rash, itching, hives, swelling of the face, lips, tongue, or throat Low blood pressure--dizziness, feeling faint or lightheaded, blurry vision Shortness of breath Side effects that usually do not require medical attention (report to your care team if they continue or are bothersome): Flushing Headache Joint pain Muscle pain Nausea Pain, redness, or  irritation at injection site This list may not describe all possible side effects. Call your doctor for medical advice about side effects. You may report side effects to FDA at 1-800-FDA-1088. Where should I keep my medication? This medication is given in a hospital or clinic. It will not be stored at home. NOTE: This sheet is a summary. It may not cover all possible information. If you have questions about this medicine, talk to your doctor, pharmacist, or health care provider.  2024 Elsevier/Gold Standard (2022-10-08 00:00:00)      To help prevent nausea and vomiting after your treatment, we encourage you to take your nausea medication as directed.  BELOW ARE SYMPTOMS THAT SHOULD BE REPORTED IMMEDIATELY: *FEVER GREATER THAN 100.4 F (38 C) OR HIGHER *CHILLS OR SWEATING *NAUSEA AND VOMITING THAT IS NOT CONTROLLED WITH YOUR NAUSEA MEDICATION *UNUSUAL SHORTNESS OF BREATH *UNUSUAL BRUISING OR BLEEDING *URINARY PROBLEMS (pain or burning when urinating, or frequent urination) *BOWEL PROBLEMS (unusual diarrhea, constipation, pain near the anus) TENDERNESS IN MOUTH AND THROAT WITH OR WITHOUT PRESENCE OF ULCERS (sore throat, sores in mouth, or a toothache) UNUSUAL RASH, SWELLING OR PAIN  UNUSUAL VAGINAL DISCHARGE OR ITCHING   Items with * indicate a potential emergency and should be followed up as soon as possible or go to the Emergency Department if any problems should occur.  Please show the CHEMOTHERAPY ALERT CARD or IMMUNOTHERAPY ALERT CARD at check-in to the Emergency Department and triage nurse.  Should you have questions after your visit or need to cancel or reschedule your appointment, please contact [facility] [place] - A DEPT OF Eligha Bridegroom [facility] [phone]  and follow the prompts.  Office hours are 8:00 a.m. to 4:30 p.m. Monday - Friday. Please note that voicemails left after 4:00 p.m. may not be returned until the following business day.  We are closed weekends  and major holidays. You have access to a nurse at all times for urgent questions. Please call the main number to the clinic [phone] and follow the prompts.  For any non-urgent questions, you may also contact your provider using MyChart. We now offer e-Visits for anyone 67 and older to request care online for non-urgent symptoms. For details visit [email or web address].   Also download the MyChart app! Go to the app store, search "MyChart", open the app, select [place], and log in with your MyChart username and password.

## 2023-09-08 NOTE — Progress Notes (Signed)
 Spoke with Patient today to discuss appointments and health maintenance screenings due for the upcoming year.     Appointments which have been scheduled    Oct 21, 2023 11:00 AM Office Visit with Comer Baird GAILS, NP [facility]) [address] [place] [address] [phone]       Care connections to place referral for N/A.  Health maintenance topics discussed and/or scheduled: Referral to Ophthalmology (diabetic eye exam) and Referral for Dexa  Visits scheduled: No visit scheduled  Comments: pt states will get dexa scan done, and will have eye appt made on her own

## 2023-09-11 ENCOUNTER — Other Ambulatory Visit: Payer: Self-pay | Admitting: Oncology

## 2023-09-11 ENCOUNTER — Inpatient Hospital Stay: Attending: Hematology

## 2023-09-11 VITALS — BP 123/55 | HR 55 | Temp 97.7°F | Resp 18

## 2023-09-11 DIAGNOSIS — D508 Other iron deficiency anemias: Secondary | ICD-10-CM

## 2023-09-11 DIAGNOSIS — M85852 Other specified disorders of bone density and structure, left thigh: Secondary | ICD-10-CM

## 2023-09-11 DIAGNOSIS — D509 Iron deficiency anemia, unspecified: Secondary | ICD-10-CM | POA: Insufficient documentation

## 2023-09-11 MED ORDER — SODIUM CHLORIDE 0.9 % IV SOLN: Freq: Once | INTRAVENOUS | Status: AC

## 2023-09-11 MED ORDER — SODIUM CHLORIDE 0.9 % IV SOLN
510.0000 mg | Freq: Once | INTRAVENOUS | Status: AC
  Administered 2023-09-11: 510 mg via INTRAVENOUS
  Filled 2023-09-11: qty 510

## 2023-09-11 NOTE — Progress Notes (Signed)
 Patient tolerated iron infusion with no complaints voiced.  Peripheral IV site clean and dry with good blood return noted before and after infusion.  Band aid applied. Pt observed for 30 minutes post iron infusion without any complications.  VSS with discharge and left in satisfactory condition with no s/s of distress noted. All follow ups as scheduled.   Walaa Carel [employer]

## 2023-09-11 NOTE — Patient Instructions (Signed)

## 2023-09-18 ENCOUNTER — Ambulatory Visit (HOSPITAL_COMMUNITY)
Admission: RE | Admit: 2023-09-18 | Discharge: 2023-09-18 | Disposition: A | Discharge status: Home / Self Care | Source: Ambulatory Visit | Attending: Oncology | Admitting: Oncology

## 2023-09-18 DIAGNOSIS — M85852 Other specified disorders of bone density and structure, left thigh: Secondary | ICD-10-CM | POA: Insufficient documentation

## 2023-10-07 ENCOUNTER — Ambulatory Visit (HOSPITAL_COMMUNITY)
Admission: RE | Admit: 2023-10-07 | Discharge: 2023-10-07 | Disposition: A | Discharge status: Home / Self Care | Payer: 59 | Source: Ambulatory Visit | Attending: Internal Medicine | Admitting: Internal Medicine

## 2023-10-07 DIAGNOSIS — I350 Nonrheumatic aortic (valve) stenosis: Secondary | ICD-10-CM | POA: Diagnosis present

## 2023-10-07 LAB — ECHOCARDIOGRAM COMPLETE
AR max vel: 1.44 cm2
AV Area VTI: 1.37 cm2
AV Area mean vel: 1.31 cm2
AV Mean grad: 27.4 mmHg
AV Peak grad: 47 mmHg
Ao pk vel: 3.43 m/s
Area-P 1/2: 2.76 cm2
Calc EF: 59.5 %
MV VTI: 1.87 cm2
S' Lateral: 2.8 cm
Single Plane A2C EF: 65.5 %
Single Plane A4C EF: 59.2 %

## 2023-10-07 NOTE — Progress Notes (Signed)
  Echocardiogram 2D Echocardiogram has been performed.  Kristy Harris 10/07/2023, 12:31 PM

## 2023-10-08 ENCOUNTER — Ambulatory Visit: Payer: Self-pay | Admitting: Internal Medicine

## 2023-11-16 ENCOUNTER — Inpatient Hospital Stay

## 2023-11-17 ENCOUNTER — Inpatient Hospital Stay: Attending: Hematology

## 2023-11-17 DIAGNOSIS — M85852 Other specified disorders of bone density and structure, left thigh: Secondary | ICD-10-CM | POA: Diagnosis not present

## 2023-11-17 DIAGNOSIS — D649 Anemia, unspecified: Secondary | ICD-10-CM | POA: Diagnosis not present

## 2023-11-17 DIAGNOSIS — Z1721 Progesterone receptor positive status: Secondary | ICD-10-CM | POA: Insufficient documentation

## 2023-11-17 DIAGNOSIS — N189 Chronic kidney disease, unspecified: Secondary | ICD-10-CM | POA: Diagnosis not present

## 2023-11-17 DIAGNOSIS — Z170 Estrogen receptor positive status [ER+]: Secondary | ICD-10-CM | POA: Insufficient documentation

## 2023-11-17 DIAGNOSIS — M79606 Pain in leg, unspecified: Secondary | ICD-10-CM | POA: Diagnosis not present

## 2023-11-17 DIAGNOSIS — C50412 Malignant neoplasm of upper-outer quadrant of left female breast: Secondary | ICD-10-CM | POA: Insufficient documentation

## 2023-11-17 DIAGNOSIS — G8929 Other chronic pain: Secondary | ICD-10-CM | POA: Diagnosis not present

## 2023-11-17 DIAGNOSIS — L732 Hidradenitis suppurativa: Secondary | ICD-10-CM | POA: Diagnosis not present

## 2023-11-17 DIAGNOSIS — D509 Iron deficiency anemia, unspecified: Secondary | ICD-10-CM | POA: Insufficient documentation

## 2023-11-17 DIAGNOSIS — N951 Menopausal and female climacteric states: Secondary | ICD-10-CM | POA: Diagnosis not present

## 2023-11-17 DIAGNOSIS — M549 Dorsalgia, unspecified: Secondary | ICD-10-CM | POA: Insufficient documentation

## 2023-11-17 DIAGNOSIS — D508 Other iron deficiency anemias: Secondary | ICD-10-CM

## 2023-11-17 LAB — COMPREHENSIVE METABOLIC PANEL WITH GFR
ALT: 15 U/L (ref 0–44)
AST: 14 U/L — ABNORMAL LOW (ref 15–41)
Albumin: 3.1 g/dL — ABNORMAL LOW (ref 3.5–5.0)
Alkaline Phosphatase: 90 U/L (ref 38–126)
Anion gap: 9 (ref 5–15)
BUN: 15 mg/dL (ref 8–23)
CO2: 25 mmol/L (ref 22–32)
Calcium: 9 mg/dL (ref 8.9–10.3)
Chloride: 102 mmol/L (ref 98–111)
Creatinine, Ser: 0.8 mg/dL (ref 0.44–1.00)
GFR, Estimated: >60 mL/min (ref >60)
Glucose, Bld: 96 mg/dL (ref 70–99)
Potassium: 4.7 mmol/L (ref 3.5–5.1)
Sodium: 136 mmol/L (ref 135–145)
Total Bilirubin: 0.5 mg/dL (ref 0.0–1.2)
Total Protein: 7.6 g/dL (ref 6.5–8.1)

## 2023-11-17 LAB — CBC WITH DIFFERENTIAL/PLATELET
Abs Immature Granulocytes: 0.02 K/uL (ref 0.00–0.07)
Basophils Absolute: 0 K/uL (ref 0.0–0.1)
Basophils Relative: 0 %
Eosinophils Absolute: 0.4 K/uL (ref 0.0–0.5)
Eosinophils Relative: 5 %
HCT: 32.2 % — ABNORMAL LOW (ref 36.0–46.0)
Hemoglobin: 9.9 g/dL — ABNORMAL LOW (ref 12.0–15.0)
Immature Granulocytes: 0 %
Lymphocytes Relative: 25 %
Lymphs Abs: 1.8 K/uL (ref 0.7–4.0)
MCH: 27.8 pg (ref 26.0–34.0)
MCHC: 30.7 g/dL (ref 30.0–36.0)
MCV: 90.4 fL (ref 80.0–100.0)
Monocytes Absolute: 0.5 K/uL (ref 0.1–1.0)
Monocytes Relative: 8 %
Neutro Abs: 4.3 K/uL (ref 1.7–7.7)
Neutrophils Relative %: 62 %
Platelets: 290 K/uL (ref 150–400)
RBC: 3.56 MIL/uL — ABNORMAL LOW (ref 3.87–5.11)
RDW: 16.1 % — ABNORMAL HIGH (ref 11.5–15.5)
WBC: 7 K/uL (ref 4.0–10.5)
nRBC: 0 % (ref 0.0–0.2)

## 2023-11-17 LAB — FERRITIN: Ferritin: 769 ng/mL — ABNORMAL HIGH (ref 11–307)

## 2023-11-23 ENCOUNTER — Inpatient Hospital Stay: Admitting: Physician Assistant

## 2023-11-24 ENCOUNTER — Inpatient Hospital Stay (HOSPITAL_BASED_OUTPATIENT_CLINIC_OR_DEPARTMENT_OTHER): Admitting: Oncology

## 2023-11-24 VITALS — BP 126/67 | HR 63 | Temp 97.7°F | Resp 20 | Wt 199.3 lb

## 2023-11-24 DIAGNOSIS — D649 Anemia, unspecified: Secondary | ICD-10-CM | POA: Insufficient documentation

## 2023-11-24 DIAGNOSIS — M85852 Other specified disorders of bone density and structure, left thigh: Secondary | ICD-10-CM | POA: Diagnosis not present

## 2023-11-24 DIAGNOSIS — C50412 Malignant neoplasm of upper-outer quadrant of left female breast: Secondary | ICD-10-CM

## 2023-11-24 MED ORDER — ANASTROZOLE 1 MG PO TABS: 1.0000 mg | ORAL_TABLET | Freq: Every day | ORAL | 3 refills | Status: AC

## 2023-11-24 NOTE — Assessment & Plan Note (Addendum)
 She is tolerating anastrozole  except for occasional hot flashes. - She is continuing anastrozole  beyond 5 years. (Started in 2017) - Mammogram on 12/29/2022 was BI-RADS Category 1.  Next due in October 2025.  Orders placed.

## 2023-11-24 NOTE — Progress Notes (Signed)
 [facility] OFFICE PROGRESS NOTE  Kristy Harris, Kristy GAILS, NP  ASSESSMENT & PLAN:    Assessment & Plan Malignant neoplasm of upper-outer quadrant of left female breast, unspecified estrogen receptor status (HCC)  She is tolerating anastrozole  except for occasional hot flashes. - She is continuing anastrozole  beyond 5 years. (Started in 2017) - Mammogram on 12/29/2022 was BI-RADS Category 1.  Next due in October 2025.  Orders placed. Anemia, unspecified type - Anemia from chronic inflammation from hidradenitis flareups, diverticulitis flares, IDA and mild CKD. - Has hidradenitis flareups. -No additional bleeding since recent hospitalization. -She received 1 unit of blood in March 2025. - She is taking 2 iron tablets daily. -She received 2 doses of IV Feraheme  on 08/28/2023 and 09/11/2023. -Repeat labs from 11/17/2023 shows improvement of her hemoglobin to 9.9 and ferritin 769. -Continue iron supplements OTC. -Return to clinic in 3 months with labs, review mammogram and Prolia  injection.  Osteopenia of neck of left femur - Last Prolia  shot on 08/20/2023. -Next shot will be due in December 2025. -Continue calcium  and vitamin D  supplements.    No orders of the defined types were placed in this encounter.   INTERVAL HISTORY: Patient returns for follow-up for history of breast cancer, osteopenia and iron deficiency anemia.  She received 2 doses of IV Feraheme  on 08/28/2023 and 09/11/2023.  She tolerated both well.  She continues iron supplements each day.  Appetite is 100% energy levels are 70 to 80%.  She has chronic leg and back pain that rates it a 9 out of 10.  Has chronic but stable shortness of breath and palpitations.  Has dizziness when she stands quickly and insomnia at times.  Overall, she is feeling well.  We reviewed CBC, CMP and ferritin.  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 The estimated ejection   fraction was in the range of 55% to 60%.    12/25/2014 Surgery   Left lumpectomy: IDC 1.7 cm, positive for LVI, with DCIS, 1/1 sentinel node positive deposit 1.9 cm with extracapsular extension, ER 90%, PR 90%, HER-2 positive ratio 2.4, Ki-67 29% T1 cN1 stage II a   02/01/2015 - 04/19/2015 Chemotherapy   Abraxane /Herceptin    03/20/2015 Echocardiogram   Systolic function was normal. The estimated ejection fraction was in the range of 60%  to 65%.   05/08/2015 - 06/22/2015 Radiation Therapy   Dr. Dewey    05/10/2015 - 02/07/2016 Antibody Plan   Herceptin  every 21 days to complete 52 weeks worth of therapy.   06/13/2015 Echocardiogram   2D echo- The estimated ejection fraction was in the range of 60% to 65%. Diastolic function is abnormal, indeterminate grade. Wall motion was normal.   06/28/2015 Imaging   Bone density- BMD as determined from Femur Total Left is 0.994 g/cm2 with a T-Score of -0.1. This patient is considered normal according to World Health Organization [facility]) criteria.    07/13/2015 -  Anti-estrogen oral therapy   Arimidex  daily   09/12/2015 Echocardiogram  The estimated ejection fraction was in the range of 60% to 65%. Wall motion was normal; there were no regional wall motion abnormalities. Doppler parameters are consistent with abnormal L ventricular relaxation (grade 1 diastolic dysfunction).   12/04/2015 Echocardiogram   Left ventricle: The cavity size was normal. Wall thickness was   increased  increased in a pattern of mild to moderate LVH.   Systolic function was normal. The estimated ejection fraction was   in the range of 60% to 65%. Wall motion was normal; there were no   regional wall motion abnormalities. Features are consistent with   a pseudonormal left ventricular filling pattern, with concomitant   abnormal relaxation and increased filling pressure (grade 2   diastolic dysfunction). Doppler parameters are consistent with   high ventricular filling pressure.   03/21/2016 Procedure   Port removed by Dr. Ethyl      CBC    Component Value Date/Time   WBC 7.0 11/17/2023 1423   RBC 3.56 (L) 11/17/2023 1423   HGB 9.9 (L) 11/17/2023 1423   HCT 32.2 (L) 11/17/2023 1423   PLT 290 11/17/2023 1423   MCV 90.4 11/17/2023 1423   MCH 27.8 11/17/2023 1423   MCHC 30.7 11/17/2023 1423   RDW 16.1 (H) 11/17/2023 1423   LYMPHSABS 1.8 11/17/2023 1423   MONOABS 0.5 11/17/2023 1423   EOSABS 0.4 11/17/2023 1423   BASOSABS 0.0 11/17/2023 1423       Latest Ref Rng & Units 11/17/2023    2:23 PM 08/13/2023    2:29 PM 05/24/2023    4:37 AM  CMP  Glucose 70 - 99 mg/dL 96  879  887   BUN 8 - 23 mg/dL 15  19  8    Creatinine 0.44 - 1.00 mg/dL 9.19  8.93  9.23   Sodium 135 - 145 mmol/L 136  139  137   Potassium 3.5 - 5.1 mmol/L 4.7  4.4  4.2   Chloride 98 - 111 mmol/L 102  105  105   CO2 22 - 32 mmol/L 25  26  25    Calcium  8.9 - 10.3 mg/dL 9.0  9.0  8.8   Total Protein 6.5 - 8.1 g/dL 7.6  7.1    Total Bilirubin 0.0 - 1.2 mg/dL 0.5  0.3    Alkaline Phos 38 - 126 U/L 90  81    AST 15 - 41 U/L 14  14    ALT 0 - 44 U/L 15  13       Lab Results  Component Value Date   FERRITIN 769 (H) 11/17/2023   VITAMINB12 1,046 (H) 01/20/2023    Vitals:   11/24/23 1343  BP: 126/67  Pulse: 63  Resp: 20  Temp: 97.7 F (36.5 C)  SpO2: 100%    Review of System:  Review of Systems  Constitutional:  Positive for malaise/fatigue. Negative for weight loss.  Respiratory:  Positive for  shortness of breath.   Cardiovascular:  Positive for palpitations.  Musculoskeletal:  Positive for back pain and joint pain.  Neurological:  Positive for dizziness.  Psychiatric/Behavioral:  The patient is nervous/anxious and has insomnia.     Physical Exam: Physical Exam Constitutional:      Appearance: Normal appearance.  HENT:     Head: Normocephalic and atraumatic.  Eyes:     Pupils: Pupils are equal, round, and reactive to light.  Cardiovascular:     Rate and Rhythm: Normal rate and regular rhythm.     Heart sounds: Normal  heart sounds. No murmur heard. Pulmonary:     Effort: Pulmonary effort is normal.     Breath sounds: Normal breath sounds. No wheezing.  Abdominal:     General: Bowel sounds are normal. There is no distension.     Palpations: Abdomen is soft.     Tenderness: There is no abdominal tenderness.  Musculoskeletal:        General: Normal range of motion.     Cervical back: Normal range of motion.  Skin:    General: Skin is warm and dry.     Findings: No rash.  Neurological:     Mental Status: She is alert and oriented to person, place, and time.     Gait: Gait is intact.  Psychiatric:        Mood and Affect: Mood and affect normal.        Cognition and Memory: Memory normal.        Judgment: Judgment normal.      I spent 20 minutes dedicated to the care of this patient (face-to-face and non-face-to-face) on the date of the encounter to include what is described in the assessment and plan.,  Delon Hope, NP 11/24/2023 2:20 PM

## 2023-11-24 NOTE — Assessment & Plan Note (Addendum)
-   Anemia from chronic inflammation from hidradenitis flareups, diverticulitis flares, IDA and mild CKD. - Has hidradenitis flareups. -No additional bleeding since recent hospitalization. -She received 1 unit of blood in March 2025. - She is taking 2 iron tablets daily. -She received 2 doses of IV Feraheme  on 08/28/2023 and 09/11/2023. -Repeat labs from 11/17/2023 shows improvement of her hemoglobin to 9.9 and ferritin 769. -Continue iron supplements OTC. -Return to clinic in 3 months with labs, review mammogram and Prolia  injection.

## 2023-11-24 NOTE — Assessment & Plan Note (Addendum)
-   Last Prolia  shot on 08/20/2023. -Next shot will be due in December 2025. -Continue calcium  and vitamin D  supplements.

## 2023-12-30 ENCOUNTER — Ambulatory Visit (HOSPITAL_COMMUNITY)
Admission: RE | Admit: 2023-12-30 | Discharge: 2023-12-30 | Disposition: A | Discharge status: Home / Self Care | Source: Ambulatory Visit | Attending: Oncology | Admitting: Oncology

## 2023-12-30 ENCOUNTER — Encounter (HOSPITAL_COMMUNITY): Payer: Self-pay

## 2023-12-30 DIAGNOSIS — Z853 Personal history of malignant neoplasm of breast: Secondary | ICD-10-CM | POA: Insufficient documentation

## 2023-12-30 DIAGNOSIS — Z1231 Encounter for screening mammogram for malignant neoplasm of breast: Secondary | ICD-10-CM | POA: Diagnosis not present

## 2023-12-30 DIAGNOSIS — C50412 Malignant neoplasm of upper-outer quadrant of left female breast: Secondary | ICD-10-CM | POA: Diagnosis present

## 2024-02-11 ENCOUNTER — Other Ambulatory Visit: Payer: Self-pay

## 2024-02-11 DIAGNOSIS — N189 Chronic kidney disease, unspecified: Secondary | ICD-10-CM

## 2024-02-11 DIAGNOSIS — M85852 Other specified disorders of bone density and structure, left thigh: Secondary | ICD-10-CM

## 2024-02-11 DIAGNOSIS — C50412 Malignant neoplasm of upper-outer quadrant of left female breast: Secondary | ICD-10-CM

## 2024-02-11 DIAGNOSIS — D508 Other iron deficiency anemias: Secondary | ICD-10-CM

## 2024-02-11 DIAGNOSIS — D649 Anemia, unspecified: Secondary | ICD-10-CM

## 2024-02-12 ENCOUNTER — Inpatient Hospital Stay

## 2024-02-12 ENCOUNTER — Inpatient Hospital Stay: Attending: Hematology

## 2024-02-12 DIAGNOSIS — M85852 Other specified disorders of bone density and structure, left thigh: Secondary | ICD-10-CM

## 2024-02-12 DIAGNOSIS — M8580 Other specified disorders of bone density and structure, unspecified site: Secondary | ICD-10-CM | POA: Diagnosis present

## 2024-02-12 DIAGNOSIS — C50412 Malignant neoplasm of upper-outer quadrant of left female breast: Secondary | ICD-10-CM

## 2024-02-12 DIAGNOSIS — D508 Other iron deficiency anemias: Secondary | ICD-10-CM

## 2024-02-12 DIAGNOSIS — D509 Iron deficiency anemia, unspecified: Secondary | ICD-10-CM | POA: Diagnosis not present

## 2024-02-12 DIAGNOSIS — N189 Chronic kidney disease, unspecified: Secondary | ICD-10-CM

## 2024-02-12 DIAGNOSIS — D649 Anemia, unspecified: Secondary | ICD-10-CM

## 2024-02-12 LAB — CBC WITH DIFFERENTIAL/PLATELET
Abs Immature Granulocytes: 0.03 K/uL (ref 0.00–0.07)
Basophils Absolute: 0 K/uL (ref 0.0–0.1)
Basophils Relative: 0 %
Eosinophils Absolute: 0.3 K/uL (ref 0.0–0.5)
Eosinophils Relative: 5 %
HCT: 30.7 % — ABNORMAL LOW (ref 36.0–46.0)
Hemoglobin: 9.5 g/dL — ABNORMAL LOW (ref 12.0–15.0)
Immature Granulocytes: 1 %
Lymphocytes Relative: 25 %
Lymphs Abs: 1.5 K/uL (ref 0.7–4.0)
MCH: 28.9 pg (ref 26.0–34.0)
MCHC: 30.9 g/dL (ref 30.0–36.0)
MCV: 93.3 fL (ref 80.0–100.0)
Monocytes Absolute: 0.4 K/uL (ref 0.1–1.0)
Monocytes Relative: 7 %
Neutro Abs: 3.9 K/uL (ref 1.7–7.7)
Neutrophils Relative %: 62 %
Platelets: ADEQUATE K/uL (ref 150–400)
RBC: 3.29 MIL/uL — ABNORMAL LOW (ref 3.87–5.11)
RDW: 14.3 % (ref 11.5–15.5)
WBC: 6.1 K/uL (ref 4.0–10.5)
nRBC: 0 % (ref 0.0–0.2)

## 2024-02-12 LAB — IRON AND TIBC
Iron: 41 ug/dL (ref 28–170)
Saturation Ratios: 20 % (ref 10.4–31.8)
TIBC: 210 ug/dL — ABNORMAL LOW (ref 250–450)
UIBC: 169 ug/dL

## 2024-02-12 LAB — COMPREHENSIVE METABOLIC PANEL WITH GFR
ALT: 20 U/L (ref 0–44)
AST: 18 U/L (ref 15–41)
Albumin: 3.8 g/dL (ref 3.5–5.0)
Alkaline Phosphatase: 126 U/L (ref 38–126)
Anion gap: 13 (ref 5–15)
BUN: 19 mg/dL (ref 8–23)
CO2: 25 mmol/L (ref 22–32)
Calcium: 10 mg/dL (ref 8.9–10.3)
Chloride: 101 mmol/L (ref 98–111)
Creatinine, Ser: 0.9 mg/dL (ref 0.44–1.00)
GFR, Estimated: >60 mL/min (ref >60)
Glucose, Bld: 120 mg/dL — ABNORMAL HIGH (ref 70–99)
Potassium: 4.5 mmol/L (ref 3.5–5.1)
Sodium: 139 mmol/L (ref 135–145)
Total Bilirubin: 0.3 mg/dL (ref 0.0–1.2)
Total Protein: 7.7 g/dL (ref 6.5–8.1)

## 2024-02-12 LAB — FERRITIN: Ferritin: 883 ng/mL — ABNORMAL HIGH (ref 11–307)

## 2024-02-15 ENCOUNTER — Inpatient Hospital Stay

## 2024-02-18 ENCOUNTER — Inpatient Hospital Stay: Admitting: Oncology

## 2024-02-18 DIAGNOSIS — C50412 Malignant neoplasm of upper-outer quadrant of left female breast: Secondary | ICD-10-CM | POA: Diagnosis not present

## 2024-02-18 DIAGNOSIS — D508 Other iron deficiency anemias: Secondary | ICD-10-CM

## 2024-02-18 DIAGNOSIS — D509 Iron deficiency anemia, unspecified: Secondary | ICD-10-CM | POA: Diagnosis not present

## 2024-02-18 DIAGNOSIS — M8588 Other specified disorders of bone density and structure, other site: Secondary | ICD-10-CM

## 2024-02-18 DIAGNOSIS — N189 Chronic kidney disease, unspecified: Secondary | ICD-10-CM

## 2024-02-18 NOTE — Assessment & Plan Note (Addendum)
-   Anemia from chronic inflammation from hidradenitis flareups, diverticulitis flares, IDA and mild CKD. -She has had anemia for most of her life and reports that she is not symptomatic. - Has hidradenitis flareups. -No additional bleeding since recent hospitalization. -She received 1 unit of blood in March 2025. - She is taking 2 iron tablets daily. -She received 2 doses of IV Feraheme  on 08/28/2023 and 09/11/2023. -Repeat labs from 02/12/2024 show an elevated ferritin of 883, hemoglobin 9.5, iron saturation 20% with a low TIBC. -Continue iron supplements OTC. -Return to clinic in 3 months with labs.

## 2024-02-18 NOTE — Progress Notes (Signed)
 [facility] OFFICE PROGRESS NOTE  Hemberg, Comer GAILS, NP  ASSESSMENT & PLAN:  I connected with Kristy Harris on 02/18/2024 at 12:45 PM EST by telephone visit and verified that I am speaking with the correct person using two identifiers.   I discussed the limitations, risks, security and privacy concerns of performing an evaluation and management service by telemedicine and the availability of in-person appointments. I also discussed with the patient that there may be a patient responsible charge related to this service. The patient expressed understanding and agreed to proceed.   Other persons participating in the visit and their role in the encounter: NP, Patient    Patients location: Home Providers location: Clinic      Assessment & Plan Malignant neoplasm of upper-outer quadrant of left female breast, unspecified estrogen receptor status (HCC) -She is tolerating anastrozole  except for occasional hot flashes. - She is continuing anastrozole  beyond 5 years. (Started in 2017) - Mammogram on 12/30/2023 was BI-RADS Category 1.  -Repeat annually.  Other iron deficiency anemia - Anemia from chronic inflammation from hidradenitis flareups, diverticulitis flares, IDA and mild CKD. -She has had anemia for most of her life and reports that she is not symptomatic. - Has hidradenitis flareups. -No additional bleeding since recent hospitalization. -She received 1 unit of blood in March 2025. - She is taking 2 iron tablets daily. -She received 2 doses of IV Feraheme  on 08/28/2023 and 09/11/2023. -Repeat labs from 02/12/2024 show an elevated ferritin of 883, hemoglobin 9.5, iron saturation 20% with a low TIBC. -Continue iron supplements OTC. -Return to clinic in 3 months with labs.   Osteopenia of other site - Last Prolia  shot on 08/20/2023. - She is due for a Prolia  shot tomorrow. -Calcium  level is 10.0.  -Continue calcium  and vitamin D  supplements.   Chronic kidney  disease, unspecified CKD stage Patient has mild CKD. She is not interested in starting erythropoietin  stimulating agent. I will check her EPO level at her next visit.  Orders Placed This Encounter  Procedures   Ferritin    Standing Status:   Standing    Number of Occurrences:   6    Expiration Date:   02/17/2025   CBC with Differential    Standing Status:   Standing    Number of Occurrences:   6    Expiration Date:   02/17/2025   Comprehensive metabolic panel with GFR    Standing Status:   Standing    Number of Occurrences:   6    Expiration Date:   02/17/2025   Iron and TIBC ([facility] ONLY)    Standing Status:   Standing    Number of Occurrences:   6    Expiration Date:   02/17/2025   Copper , serum    Standing Status:   Future    Expected Date:   05/18/2024    Expiration Date:   08/16/2024   Vitamin B12    Standing Status:   Future    Expected Date:   05/18/2024    Expiration Date:   08/16/2024   Methylmalonic acid, serum    Standing Status:   Future    Expected Date:   05/18/2024    Expiration Date:   08/16/2024   Folate    Standing Status:   Future    Expected Date:   05/18/2024    Expiration Date:   08/16/2024   Erythropoietin     Standing Status:   Future  Expected Date:   05/18/2024    Expiration Date:   02/17/2025    INTERVAL HISTORY: Patient returns for follow-up for history of breast cancer, osteopenia and iron deficiency anemia.  She received 2 doses of IV Feraheme  on 08/28/2023 and 09/11/2023.  She tolerated both well.  She continues iron supplements each day.  Appetite is 100% energy levels are 70 to 80%.  She has chronic lower back pain and rates it as 7 out of 10.  Reports dizziness when she changes positions and shortness of breath with exertion.  She has occasional palpitations.  Reports HD flares on occasion but no significant blood loss.  No recent antibiotics.  Overall, she is feeling well.  Reports she has been anemic all of her life.   She  recently had a mammogram.  She is continuing anastrozole  for a total of 10 years which will be in 2027.  We reviewed CBC, CMP and ferritin.  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 The estimated ejection   fraction was in the range of 55% to 60%.    12/25/2014 Surgery   Left lumpectomy: IDC 1.7 cm, positive for LVI, with DCIS, 1/1 sentinel node positive deposit 1.9 cm with extracapsular extension, ER 90%, PR 90%, HER-2 positive ratio 2.4, Ki-67 29% T1 cN1 stage II a   02/01/2015 - 04/19/2015 Chemotherapy   Abraxane /Herceptin    03/20/2015 Echocardiogram   Systolic function was normal. The estimated ejection fraction was in the range of 60%  to 65%.   05/08/2015 - 06/22/2015 Radiation Therapy   Dr. Dewey    05/10/2015 - 02/07/2016 Antibody Plan   Herceptin  every 21 days to complete 52 weeks worth of therapy.   06/13/2015 Echocardiogram   2D echo- The estimated ejection fraction was in the range of 60% to 65%. Diastolic function is abnormal, indeterminate grade. Wall motion was normal.   06/28/2015 Imaging   Bone density- BMD as determined from Femur Total Left is 0.994 g/cm2 with a T-Score of -0.1. This patient is considered normal according to World Health Organization [facility]) criteria.    07/13/2015 -  Anti-estrogen oral therapy   Arimidex  daily   09/12/2015 Echocardiogram   The estimated ejection fraction was in the range of 60% to 65%. Wall motion was normal; there  were no regional wall motion abnormalities. Doppler parameters are consistent with abnormal L ventricular relaxation (grade 1 diastolic dysfunction).   12/04/2015 Echocardiogram   Left ventricle: The cavity size was normal. Wall thickness was   increased increased in a pattern of mild to moderate LVH.   Systolic function was normal. The estimated ejection fraction was   in the range of 60% to 65%. Wall motion was normal; there were no   regional wall motion abnormalities. Features are consistent with   a pseudonormal left ventricular filling pattern, with concomitant   abnormal relaxation and increased filling pressure (grade 2   diastolic dysfunction). Doppler parameters are consistent with   high ventricular filling pressure.   03/21/2016 Procedure  Port removed by Dr. Ethyl      CBC    Component Value Date/Time   WBC 6.1 02/12/2024 1058   RBC 3.29 (L) 02/12/2024 1058   HGB 9.5 (L) 02/12/2024 1058   HCT 30.7 (L) 02/12/2024 1058   PLT  02/12/2024 1058    PLATELET CLUMPS NOTED ON SMEAR, COUNT APPEARS ADEQUATE   MCV 93.3 02/12/2024 1058   MCH 28.9 02/12/2024 1058   MCHC 30.9 02/12/2024 1058   RDW 14.3 02/12/2024 1058   LYMPHSABS 1.5 02/12/2024 1058   MONOABS 0.4 02/12/2024 1058   EOSABS 0.3 02/12/2024 1058   BASOSABS 0.0 02/12/2024 1058       Latest Ref Rng & Units 02/12/2024   10:58 AM 11/17/2023    2:23 PM 08/13/2023    2:29 PM  CMP  Glucose 70 - 99 mg/dL 879  96  879   BUN 8 - 23 mg/dL 19  15  19    Creatinine 0.44 - 1.00 mg/dL 9.09  9.19  8.93   Sodium 135 - 145 mmol/L 139  136  139   Potassium 3.5 - 5.1 mmol/L 4.5  4.7  4.4   Chloride 98 - 111 mmol/L 101  102  105   CO2 22 - 32 mmol/L 25  25  26    Calcium  8.9 - 10.3 mg/dL 89.9  9.0  9.0   Total Protein 6.5 - 8.1 g/dL 7.7  7.6  7.1   Total Bilirubin 0.0 - 1.2 mg/dL 0.3  0.5  0.3   Alkaline Phos 38 - 126 U/L 126  90  81   AST 15 - 41 U/L 18  14  14    ALT 0 - 44 U/L 20  15  13       Lab Results  Component Value  Date   FERRITIN 883 (H) 02/12/2024   VITAMINB12 1,046 (H) 01/20/2023    There were no vitals filed for this visit.   Review of System:  Review of Systems  Constitutional:  Positive for malaise/fatigue.  Respiratory:  Positive for shortness of breath.   Cardiovascular:  Positive for palpitations.  Musculoskeletal:  Positive for back pain.  Neurological:  Positive for dizziness.    Physical Exam: Physical Exam Neurological:     Mental Status: She is alert and oriented to person, place, and time.     I provided 18 minutes of non face-to-face telephone visit time during this encounter, and > 50% was spent counseling as documented under my assessment & plan.  Delon Hope, NP 02/18/2024 1:26 PM

## 2024-02-18 NOTE — Assessment & Plan Note (Addendum)
-  She is tolerating anastrozole  except for occasional hot flashes. - She is continuing anastrozole  beyond 5 years. (Started in 2017) - Mammogram on 12/30/2023 was BI-RADS Category 1.  -Repeat annually.

## 2024-02-18 NOTE — Assessment & Plan Note (Addendum)
-   Last Prolia  shot on 08/20/2023. - She is due for a Prolia  shot tomorrow. -Calcium  level is 10.0.  -Continue calcium  and vitamin D  supplements.

## 2024-02-19 ENCOUNTER — Inpatient Hospital Stay

## 2024-02-19 ENCOUNTER — Inpatient Hospital Stay: Admitting: Oncology

## 2024-02-19 VITALS — BP 116/54 | HR 63 | Temp 97.2°F | Resp 20

## 2024-02-19 DIAGNOSIS — M85852 Other specified disorders of bone density and structure, left thigh: Secondary | ICD-10-CM

## 2024-02-19 DIAGNOSIS — M8580 Other specified disorders of bone density and structure, unspecified site: Secondary | ICD-10-CM | POA: Diagnosis not present

## 2024-02-19 MED ORDER — DENOSUMAB 60 MG/ML ~~LOC~~ SOSY
60.0000 mg | PREFILLED_SYRINGE | Freq: Once | SUBCUTANEOUS | Status: AC
  Administered 2024-02-19: 60 mg via SUBCUTANEOUS
  Filled 2024-02-19: qty 1

## 2024-02-19 NOTE — Progress Notes (Signed)
 Labs reviewed from 02/12/2024. Per pt she is taking medications as prescribed. Patient tolerated  prolia  injection with no complaints voiced.  Site clean and dry with no bruising or swelling noted at site.  See MAR for details.  Band aid applied.  Patient stable during and after injection.  Vss with discharge and left in satisfactory condition with no s/s of distress noted. All follow ups as scheduled.   Kristy Harris

## 2024-02-19 NOTE — Patient Instructions (Signed)
 Denosumab Injection (Osteoporosis) What is this medication? DENOSUMAB (den oh SUE mab) prevents and treats osteoporosis. It works by Interior and spatial designer stronger and less likely to break (fracture). It is a monoclonal antibody. This medicine may be used for other purposes; ask your health care provider or pharmacist if you have questions. COMMON BRAND NAME(S): Prolia What should I tell my care team before I take this medication? They need to know if you have any of these conditions: Dental or gum disease Had thyroid or parathyroid (glands located in neck) surgery Having dental surgery or a tooth pulled Kidney disease Low levels of calcium in the blood On dialysis Poor nutrition Thyroid disease Trouble absorbing nutrients from your food An unusual or allergic reaction to denosumab, other medications, foods, dyes, or preservatives Pregnant or trying to get pregnant Breastfeeding How should I use this medication? This medication is injected under the skin. It is given by your care team in a hospital or clinic setting. A special MedGuide will be given to you before each treatment. Be sure to read this information carefully each time. Talk to your care team about the use of this medication in children. Special care may be needed. Overdosage: If you think you have taken too much of this medicine contact a poison control center or emergency room at once. NOTE: This medicine is only for you. Do not share this medicine with others. What if I miss a dose? Keep appointments for follow-up doses. It is important not to miss your dose. Call your care team if you are unable to keep an appointment. What may interact with this medication? Do not take this medication with any of the following: Other medications that contain denosumab This medication may also interact with the following: Medications that lower your chance of fighting infection Steroid medications, such as prednisone or cortisone This  list may not describe all possible interactions. Give your health care provider a list of all the medicines, herbs, non-prescription drugs, or dietary supplements you use. Also tell them if you smoke, drink alcohol, or use illegal drugs. Some items may interact with your medicine. What should I watch for while using this medication? Your condition will be monitored carefully while you are receiving this medication. You may need blood work done while taking this medication. This medication may increase your risk of getting an infection. Call your care team for advice if you get a fever, chills, sore throat, or other symptoms of a cold or flu. Do not treat yourself. Try to avoid being around people who are sick. Tell your dentist and dental surgeon that you are taking this medication. You should not have major dental surgery while on this medication. See your dentist to have a dental exam and fix any dental problems before starting this medication. Take good care of your teeth while on this medication. Make sure you see your dentist for regular follow-up appointments. This medication may cause low levels of calcium in your body. The risk of severe side effects is increased in people with kidney disease. Your care team may prescribe calcium and vitamin D to help prevent low calcium levels while you take this medication. It is important to take calcium and vitamin D as directed by your care team. Talk to your care team if you may be pregnant. Serious birth defects may occur if you take this medication during pregnancy and for 5 months after the last dose. You will need a negative pregnancy test before starting this medication. Contraception  is recommended while taking this medication and for 5 months after the last dose. Your care team can help you find the option that works for you. Talk to your care team before breastfeeding. Changes to your treatment plan may be needed. What side effects may I notice from  receiving this medication? Side effects that you should report to your care team as soon as possible: Allergic reactions--skin rash, itching, hives, swelling of the face, lips, tongue, or throat Infection--fever, chills, cough, sore throat, wounds that don't heal, pain or trouble when passing urine, general feeling of discomfort or being unwell Low calcium level--muscle pain or cramps, confusion, tingling, or numbness in the hands or feet Osteonecrosis of the jaw--pain, swelling, or redness in the mouth, numbness of the jaw, poor healing after dental work, unusual discharge from the mouth, visible bones in the mouth Severe bone, joint, or muscle pain Skin infection--skin redness, swelling, warmth, or pain Side effects that usually do not require medical attention (report these to your care team if they continue or are bothersome): Back pain Headache Joint pain Muscle pain Pain in the hands, arms, legs, or feet Runny or stuffy nose Sore throat This list may not describe all possible side effects. Call your doctor for medical advice about side effects. You may report side effects to FDA at 1-800-FDA-1088. Where should I keep my medication? This medication is given in a hospital or clinic. It will not be stored at home. NOTE: This sheet is a summary. It may not cover all possible information. If you have questions about this medicine, talk to your doctor, pharmacist, or health care provider.  2024 Elsevier/Gold Standard (2022-03-25 00:00:00)

## 2024-02-22 ENCOUNTER — Inpatient Hospital Stay

## 2024-02-22 ENCOUNTER — Inpatient Hospital Stay: Admitting: Physician Assistant

## 2024-03-30 ENCOUNTER — Ambulatory Visit: Admitting: Student

## 2024-04-13 ENCOUNTER — Ambulatory Visit: Admitting: Internal Medicine

## 2024-05-18 ENCOUNTER — Inpatient Hospital Stay: Attending: Hematology

## 2024-08-12 ENCOUNTER — Inpatient Hospital Stay: Attending: Hematology

## 2024-08-19 ENCOUNTER — Inpatient Hospital Stay

## 2024-08-19 ENCOUNTER — Inpatient Hospital Stay: Admitting: Oncology
